# Patient Record
Sex: Female | Born: 1960 | Race: White | Hispanic: No | Marital: Married | Smoking: Never smoker
Health system: Southern US, Community
[De-identification: ages and names within clinical notes are randomized; demographics above are authoritative.]

## PROBLEM LIST (undated history)

## (undated) DIAGNOSIS — Z8614 Personal history of Methicillin resistant Staphylococcus aureus infection: Secondary | ICD-10-CM

## (undated) DIAGNOSIS — R112 Nausea with vomiting, unspecified: Secondary | ICD-10-CM

## (undated) DIAGNOSIS — Z862 Personal history of diseases of the blood and blood-forming organs and certain disorders involving the immune mechanism: Secondary | ICD-10-CM

## (undated) DIAGNOSIS — R519 Headache, unspecified: Secondary | ICD-10-CM

## (undated) DIAGNOSIS — R51 Headache: Secondary | ICD-10-CM

## (undated) DIAGNOSIS — N319 Neuromuscular dysfunction of bladder, unspecified: Secondary | ICD-10-CM

## (undated) DIAGNOSIS — Z803 Family history of malignant neoplasm of breast: Secondary | ICD-10-CM

## (undated) DIAGNOSIS — G35 Multiple sclerosis: Secondary | ICD-10-CM

## (undated) DIAGNOSIS — R5383 Other fatigue: Secondary | ICD-10-CM

## (undated) DIAGNOSIS — H8110 Benign paroxysmal vertigo, unspecified ear: Secondary | ICD-10-CM

## (undated) DIAGNOSIS — K5900 Constipation, unspecified: Secondary | ICD-10-CM

## (undated) DIAGNOSIS — Z9889 Other specified postprocedural states: Secondary | ICD-10-CM

## (undated) DIAGNOSIS — Z87442 Personal history of urinary calculi: Secondary | ICD-10-CM

## (undated) HISTORY — PX: OOPHORECTOMY: SHX86

## (undated) HISTORY — DX: Benign paroxysmal vertigo, unspecified ear: H81.10

## (undated) HISTORY — PX: BREAST LUMPECTOMY: SHX2

## (undated) HISTORY — PX: TONSILLECTOMY AND ADENOIDECTOMY: SUR1326

## (undated) HISTORY — PX: COLONOSCOPY: SHX174

---

## 1997-12-28 ENCOUNTER — Ambulatory Visit (HOSPITAL_BASED_OUTPATIENT_CLINIC_OR_DEPARTMENT_OTHER): Admission: RE | Admit: 1997-12-28 | Discharge: 1997-12-28 | Payer: Self-pay | Admitting: Surgery

## 1999-08-25 ENCOUNTER — Other Ambulatory Visit: Admission: RE | Admit: 1999-08-25 | Discharge: 1999-08-25 | Payer: Self-pay | Admitting: Obstetrics and Gynecology

## 2000-11-07 ENCOUNTER — Other Ambulatory Visit: Admission: RE | Admit: 2000-11-07 | Discharge: 2000-11-07 | Payer: Self-pay | Admitting: Obstetrics and Gynecology

## 2002-01-02 ENCOUNTER — Other Ambulatory Visit: Admission: RE | Admit: 2002-01-02 | Discharge: 2002-01-02 | Payer: Self-pay | Admitting: Obstetrics and Gynecology

## 2002-01-07 ENCOUNTER — Encounter: Payer: Self-pay | Admitting: Obstetrics and Gynecology

## 2002-01-07 ENCOUNTER — Encounter: Admission: RE | Admit: 2002-01-07 | Discharge: 2002-01-07 | Payer: Self-pay | Admitting: Obstetrics and Gynecology

## 2003-04-27 ENCOUNTER — Other Ambulatory Visit: Admission: RE | Admit: 2003-04-27 | Discharge: 2003-04-27 | Payer: Self-pay | Admitting: Obstetrics and Gynecology

## 2003-12-23 ENCOUNTER — Encounter: Admission: RE | Admit: 2003-12-23 | Discharge: 2003-12-23 | Payer: Self-pay | Admitting: Obstetrics and Gynecology

## 2004-05-18 ENCOUNTER — Encounter: Admission: RE | Admit: 2004-05-18 | Discharge: 2004-05-18 | Payer: Self-pay | Admitting: Obstetrics and Gynecology

## 2004-05-23 ENCOUNTER — Encounter: Admission: RE | Admit: 2004-05-23 | Discharge: 2004-05-23 | Payer: Self-pay | Admitting: Obstetrics and Gynecology

## 2004-06-08 ENCOUNTER — Other Ambulatory Visit: Admission: RE | Admit: 2004-06-08 | Discharge: 2004-06-08 | Payer: Self-pay | Admitting: Obstetrics and Gynecology

## 2005-06-29 ENCOUNTER — Encounter: Admission: RE | Admit: 2005-06-29 | Discharge: 2005-06-29 | Payer: Self-pay | Admitting: Obstetrics and Gynecology

## 2005-07-27 ENCOUNTER — Encounter: Admission: RE | Admit: 2005-07-27 | Discharge: 2005-07-27 | Payer: Self-pay | Admitting: Obstetrics and Gynecology

## 2005-11-27 ENCOUNTER — Encounter: Admission: RE | Admit: 2005-11-27 | Discharge: 2005-11-27 | Payer: Self-pay | Admitting: Obstetrics and Gynecology

## 2006-07-02 ENCOUNTER — Encounter: Admission: RE | Admit: 2006-07-02 | Discharge: 2006-07-02 | Payer: Self-pay | Admitting: Obstetrics and Gynecology

## 2007-07-04 ENCOUNTER — Encounter: Admission: RE | Admit: 2007-07-04 | Discharge: 2007-07-04 | Payer: Self-pay | Admitting: Obstetrics and Gynecology

## 2008-07-05 ENCOUNTER — Encounter: Admission: RE | Admit: 2008-07-05 | Discharge: 2008-07-05 | Payer: Self-pay | Admitting: Obstetrics and Gynecology

## 2008-12-12 ENCOUNTER — Ambulatory Visit (HOSPITAL_COMMUNITY): Admission: RE | Admit: 2008-12-12 | Discharge: 2008-12-12 | Payer: Self-pay | Admitting: Family Medicine

## 2009-07-12 ENCOUNTER — Encounter: Admission: RE | Admit: 2009-07-12 | Discharge: 2009-07-12 | Payer: Self-pay | Admitting: Obstetrics and Gynecology

## 2009-07-14 ENCOUNTER — Encounter: Admission: RE | Admit: 2009-07-14 | Discharge: 2009-07-14 | Payer: Self-pay | Admitting: Obstetrics and Gynecology

## 2010-03-26 ENCOUNTER — Encounter: Payer: Self-pay | Admitting: Obstetrics and Gynecology

## 2010-06-13 ENCOUNTER — Other Ambulatory Visit: Payer: Self-pay | Admitting: Obstetrics and Gynecology

## 2010-06-13 DIAGNOSIS — Z1231 Encounter for screening mammogram for malignant neoplasm of breast: Secondary | ICD-10-CM

## 2010-07-25 ENCOUNTER — Ambulatory Visit
Admission: RE | Admit: 2010-07-25 | Discharge: 2010-07-25 | Disposition: A | Discharge status: Home / Self Care | Payer: BC Managed Care – PPO | Source: Ambulatory Visit | Attending: Obstetrics and Gynecology | Admitting: Obstetrics and Gynecology

## 2010-07-25 DIAGNOSIS — Z1231 Encounter for screening mammogram for malignant neoplasm of breast: Secondary | ICD-10-CM

## 2011-07-17 ENCOUNTER — Other Ambulatory Visit: Payer: Self-pay | Admitting: Obstetrics and Gynecology

## 2011-07-17 DIAGNOSIS — Z1231 Encounter for screening mammogram for malignant neoplasm of breast: Secondary | ICD-10-CM

## 2011-07-31 ENCOUNTER — Ambulatory Visit
Admission: RE | Admit: 2011-07-31 | Discharge: 2011-07-31 | Disposition: A | Discharge status: Home / Self Care | Payer: Commercial Indemnity | Source: Ambulatory Visit | Attending: Obstetrics and Gynecology | Admitting: Obstetrics and Gynecology

## 2011-07-31 DIAGNOSIS — Z1231 Encounter for screening mammogram for malignant neoplasm of breast: Secondary | ICD-10-CM

## 2012-07-02 ENCOUNTER — Other Ambulatory Visit: Payer: Self-pay

## 2012-07-02 DIAGNOSIS — Z1231 Encounter for screening mammogram for malignant neoplasm of breast: Secondary | ICD-10-CM

## 2012-07-31 ENCOUNTER — Ambulatory Visit
Admission: RE | Admit: 2012-07-31 | Discharge: 2012-07-31 | Disposition: A | Discharge status: Home / Self Care | Payer: Commercial Indemnity | Source: Ambulatory Visit

## 2012-07-31 DIAGNOSIS — Z1231 Encounter for screening mammogram for malignant neoplasm of breast: Secondary | ICD-10-CM

## 2012-11-25 ENCOUNTER — Other Ambulatory Visit: Payer: Self-pay | Admitting: Obstetrics and Gynecology

## 2013-07-06 ENCOUNTER — Other Ambulatory Visit: Payer: Self-pay

## 2013-07-06 DIAGNOSIS — Z1231 Encounter for screening mammogram for malignant neoplasm of breast: Secondary | ICD-10-CM

## 2013-08-13 ENCOUNTER — Ambulatory Visit
Admission: RE | Admit: 2013-08-13 | Discharge: 2013-08-13 | Disposition: A | Discharge status: Home / Self Care | Payer: Commercial Indemnity | Source: Ambulatory Visit

## 2013-08-13 ENCOUNTER — Encounter (INDEPENDENT_AMBULATORY_CARE_PROVIDER_SITE_OTHER): Payer: Self-pay

## 2013-08-13 DIAGNOSIS — Z1231 Encounter for screening mammogram for malignant neoplasm of breast: Secondary | ICD-10-CM

## 2014-01-07 ENCOUNTER — Other Ambulatory Visit: Payer: Self-pay | Admitting: Obstetrics and Gynecology

## 2014-01-07 DIAGNOSIS — N63 Unspecified lump in unspecified breast: Secondary | ICD-10-CM

## 2014-01-18 ENCOUNTER — Other Ambulatory Visit: Payer: Commercial Indemnity

## 2014-01-20 ENCOUNTER — Ambulatory Visit
Admission: RE | Admit: 2014-01-20 | Discharge: 2014-01-20 | Disposition: A | Discharge status: Home / Self Care | Payer: Commercial Indemnity | Source: Ambulatory Visit | Attending: Obstetrics and Gynecology | Admitting: Obstetrics and Gynecology

## 2014-01-20 DIAGNOSIS — N63 Unspecified lump in unspecified breast: Secondary | ICD-10-CM

## 2014-07-26 ENCOUNTER — Other Ambulatory Visit: Payer: Self-pay

## 2014-07-26 DIAGNOSIS — Z1231 Encounter for screening mammogram for malignant neoplasm of breast: Secondary | ICD-10-CM

## 2014-09-08 ENCOUNTER — Ambulatory Visit
Admission: RE | Admit: 2014-09-08 | Discharge: 2014-09-08 | Disposition: A | Discharge status: Home / Self Care | Payer: Managed Care, Other (non HMO) | Source: Ambulatory Visit

## 2014-09-08 DIAGNOSIS — Z1231 Encounter for screening mammogram for malignant neoplasm of breast: Secondary | ICD-10-CM

## 2014-09-27 ENCOUNTER — Other Ambulatory Visit: Payer: Self-pay | Admitting: Obstetrics and Gynecology

## 2014-09-29 LAB — CYTOLOGY - PAP

## 2014-10-12 ENCOUNTER — Encounter: Payer: Self-pay | Admitting: Neurology

## 2014-10-12 ENCOUNTER — Ambulatory Visit (INDEPENDENT_AMBULATORY_CARE_PROVIDER_SITE_OTHER): Payer: Managed Care, Other (non HMO) | Admitting: Neurology

## 2014-10-12 VITALS — BP 134/90 | HR 84 | Resp 14 | Ht 65.75 in | Wt 121.2 lb

## 2014-10-12 DIAGNOSIS — H8110 Benign paroxysmal vertigo, unspecified ear: Secondary | ICD-10-CM | POA: Diagnosis not present

## 2014-10-12 DIAGNOSIS — R350 Frequency of micturition: Secondary | ICD-10-CM | POA: Diagnosis not present

## 2014-10-12 DIAGNOSIS — R5383 Other fatigue: Secondary | ICD-10-CM | POA: Diagnosis not present

## 2014-10-12 DIAGNOSIS — R200 Anesthesia of skin: Secondary | ICD-10-CM | POA: Diagnosis not present

## 2014-10-12 DIAGNOSIS — R260 Ataxic gait: Secondary | ICD-10-CM

## 2014-10-12 HISTORY — DX: Benign paroxysmal vertigo, unspecified ear: H81.10

## 2014-10-12 NOTE — Progress Notes (Signed)
[facility]  PATIENT: Crystal Park DOB: 02-15-1961  REFERRING DOCTOR OR PCP:  Radene Journey (ENT referred)   Lona Kettle (PCP) SOURCE: paitent and records from Dr. Lucia Gaskins.    CT images on PACS  _________________________________   HISTORICAL  CHIEF COMPLAINT:  Chief Complaint  Patient presents with  . Dizziness    Crystal Park is here with her husband [name] for eval of intermittent dizziness, first noticed in 2010, worse in the last yr.  Worse with lying flat and with quick movements.  No dizziness with sitting still. Sts. mri's have been neg.  Sts. eng was neg.  Sts. min. relief with epley maneuver.  Not sure if she's had vestibular rehab./fim    HISTORY OF PRESENT ILLNESS:  I had the pleasure of seeing your patient, Crystal Park, at [facility] for a neurologic consultation regarding her poor balance. As you know, she is a 54 year old woman who had an episode in 2010 where she woke up with diplopia.  This lasted 1 day. She went to the emergency room and had a CT scan done. I reviewed the actual images on PACS and they are normal. She also had an MRI of the brain performed at that time but those images and the report are not available for my review.   She recalls being told that the MRI showed one abnormal focus that was felt to be more related to migraines than to a possibility of MS. Since that day in 2010, though the diplopia improved, she has continued to feel off balance. She feels her right leg has more difficulties than the left. The sensation of being off balance fluctuates quite a bit and, for the most part, has been stable over the past few years.  This summer, she was at the beach outside for a while in the heat when she had the onset of significant worsening of her balance and more difficulty with weakness in the right leg. She went to the emergency room and was given IV fluids and felt better later that day. She had another mild  episode while shopping recently, as well.  She feels her gait is a little wide. She does best when she looks at the horizon when she walks. If she looks down she feels off balance. She turns the lights on at night to help with her walking.  She has noted tingling in her feet since 2010.      Over the past 2-3 years, she has had increasing urinary frequency and urgency and has had some incontinence. He was having nocturia but that is doing much better since Detrol was started. The occasional incontinence has also improved on Detrol.  Besides the double vision 6 years ago, she has not had any other definite visual symptoms.  She has had more fatigue over the past few years as well. She denies any difficulty with fatigue in the morning. However, as the day goes on she will have more difficulties with this. She has not had any episodes of severe lassitude. She feels her sleep is doing about the same now as it was in the past. She notes that she falls asleep easily but will wake up several times at night. However, she usually falls back asleep easily.  She denies any difficulty with depression. She has mild anxiety but feels that it is a stable issue for her. She has not noted any difficulties with cognition.  In July, 2016,, she had VNG and audiologic testing. 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   She notes some fatigue Eyes: No visual changes, double vision, eye pain Ear, nose and throat: No hearing loss, ear pain, nasal congestion, sore throat Cardiovascular: No chest pain, palpitations Respiratory: No shortness of breath at rest or with exertion.   No wheezes GastrointestinaI: No nausea, vomiting, diarrhea, abdominal pain, fecal incontinence Genitourinary: As above. No UTIs. Musculoskeletal: No neck pain, back pain Integumentary: No rash, pruritus, skin lesions Neurological: as  above Psychiatric: No depression at this time.  Mild anxiety Endocrine: No palpitations, diaphoresis, change in appetite, change in weigh or increased thirst Hematologic/Lymphatic: No anemia, purpura, petechiae. Allergic/Immunologic: No itchy/runny eyes, nasal congestion, recent allergic reactions, rashes  ALLERGIES: No Known Allergies  HOME MEDICATIONS:  Current outpatient prescriptions:  .  tolterodine (DETROL LA) 4 MG 24 hr capsule, , Disp: , Rfl:   PAST MEDICAL HISTORY: Past Medical History  Diagnosis Date  . Benign paroxysmal positional vertigo 10/12/2014    PAST SURGICAL HISTORY: History reviewed. No pertinent past surgical history.  FAMILY HISTORY: History reviewed. No pertinent family history.  SOCIAL HISTORY:  History   Social History  . Marital Status: Married    Spouse Name: N/A  . Number of Children: N/A  . Years of Education: N/A   Occupational History  . Not on file.   Social History Main Topics  . Smoking status: Never Smoker   . Smokeless tobacco: Not on file  . Alcohol Use: Not on file  . Drug Use: Not on file  . Sexual Activity: Not on file   Other Topics Concern  . Not on file   Social History Narrative  . No narrative on file     PHYSICAL EXAM  Filed Vitals:   10/12/14 1416  BP: 134/90  Pulse: 84  Resp: 14  Height: 5' 5.75" (1.67 m)  Weight: 121 lb 3.2 oz (54.976 kg)    Body mass index is 19.71 kg/(m^2).   General: The patient is well-developed and well-nourished and in no acute distress  Eyes:  Funduscopic exam is not entirely focused due to astigmatism.   Normal optic disc color and retinal vessels.  Neck: The neck is supple, no carotid bruits are noted.  The neck is nontender.  Cardiovascular: The heart has a regular rate and rhythm with a normal S1 and S2. There were no murmurs, gallops or rubs.   Skin: Extremities are without significant edema.  Musculoskeletal:  Back is nontender  Neurologic Exam  Mental  status: The patient is alert and oriented x 3 at the time of the examination. The patient has apparent normal recent and remote memory, with an apparently normal attention span and concentration ability.   Speech is normal.  Cranial nerves: Extraocular movements are full. Pupils are equal, round, and reactive to light and accomodation.    Facial symmetry is present. There is good facial sensation to soft touch bilaterally.Facial strength is normal.  Trapezius and sternocleidomastoid strength is normal. No dysarthria is noted.  The tongue is midline, and the patient has symmetric elevation of the soft palate. No obvious hearing deficits are noted.  Motor:  Muscle bulk is normal.   Tone is normal. Strength is  5 / 5 in the arms and proximal legs. However, strength is 4/5 in the EHL and intrinsic foot muscles.   Sensory: Sensory testing is intact to pinprick, soft touch and vibration sensation in the  arms. However, she has reduced vibration sensation at ankles and very reduced vibration sensation in the toes.  Coordination: Cerebellar testing reveals good finger-nose-finger and heel-to-shin bilaterally.  Gait and station: Station is normal.   Gait is wide stance. She has an unstable turn. There is no retropulsion. She is unable to do tandem walk.. Romberg is negative.   Reflexes: Deep tendon reflexes are symmetric and 3+ bilaterally and increased furhter in the knees (with spread).  No ankle clonus..   Plantar responses are flexor.    DIAGNOSTIC DATA (LABS, IMAGING, TESTING) - I reviewed patient records, labs, notes, testing and imaging myself where available.     ASSESSMENT AND PLAN  Benign paroxysmal positional vertigo, unspecified laterality  Ataxic gait - Plan: MR Brain W Wo Contrast, MR Cervical Spine W Wo Contrast, Sedimentation rate, Multiple myeloma panel, serum, Vitamin B12, ANA w/Reflex, Vit D  25 hydroxy (rtn osteoporosis monitoring)  Urinary frequency - Plan: MR Brain W Wo  Contrast, MR Cervical Spine W Wo Contrast  Other fatigue - Plan: Multiple myeloma panel, serum, Vitamin B12, ANA w/Reflex, Vit D  25 hydroxy (rtn osteoporosis monitoring)  Numbness - Plan: MR Brain W Wo Contrast, MR Cervical Spine W Wo Contrast, Sedimentation rate, Multiple myeloma panel, serum, Vitamin B12, ANA w/Reflex    In summary, Crystal Park is a 54 yo woman with gait ataxia and a sensation of being off balanced who also has numbness and urinary fatigue.   Despite the MRI report from 2010, I remain concerned about the possibility of multiple sclerosis.   Additionally, structural brain issues (CVA, hydrocephalus) and disorders of the spinal cord could explain her symptoms.   We will check an MRI of the brain and cervical spine to better assess these possibilities.  If changes consistent with MS are seen, we would need to start a disease modifying agent.   If a spinal cord process is seen, further evaluation or procedures may be necessary.     Her leg numbness is symmetric and polyneuropathy needs to also be considered.    B12, SPEP/IEF, ANA, ESR will be checked.    If these studies all return normal, I will also check NCV/EMG.  I will see her back in 6 weeks or sooner if she has new or worsening symptoms.  Thank you for asking me to see Crystal Park for a neurologic consultation.  Please let me know ifI can be of further assistance with her or other patients in the future.Nanine Means. Felecia Shelling, MD, PhD 09/11/1502, 1:36 PM Certified in Neurology, Clinical Neurophysiology, Sleep Medicine, Pain Medicine and Neuroimaging  [facility] [address] [place], [place] [address] [phone]

## 2014-10-14 LAB — MULTIPLE MYELOMA PANEL, SERUM
Albumin SerPl Elph-Mcnc: 4 g/dL (ref 2.9–4.4)
Albumin/Glob SerPl: 1.7 (ref 0.7–1.7)
Alpha 1: 0.2 g/dL (ref 0.0–0.4)
Alpha2 Glob SerPl Elph-Mcnc: 0.6 g/dL (ref 0.4–1.0)
B-Globulin SerPl Elph-Mcnc: 0.9 g/dL (ref 0.7–1.3)
Gamma Glob SerPl Elph-Mcnc: 0.7 g/dL (ref 0.4–1.8)
Globulin, Total: 2.5 g/dL (ref 2.2–3.9)
IgA/Immunoglobulin A, Serum: 141 mg/dL (ref 87–352)
IgG (Immunoglobin G), Serum: 846 mg/dL (ref 700–1600)
IgM (Immunoglobulin M), Srm: 117 mg/dL (ref 26–217)
Total Protein: 6.5 g/dL (ref 6.0–8.5)

## 2014-10-14 LAB — ANA W/REFLEX: Anti Nuclear Antibody(ANA): NEGATIVE

## 2014-10-14 LAB — VITAMIN D 25 HYDROXY (VIT D DEFICIENCY, FRACTURES): Vit D, 25-Hydroxy: 26.2 ng/mL — ABNORMAL LOW (ref 30.0–100.0)

## 2014-10-14 LAB — VITAMIN B12: Vitamin B-12: >2000 pg/mL — ABNORMAL HIGH (ref 211–946)

## 2014-10-14 LAB — SEDIMENTATION RATE: Sed Rate: 2 mm/hr (ref 0–40)

## 2014-10-18 ENCOUNTER — Telehealth: Payer: Self-pay | Admitting: *Deleted

## 2014-10-18 ENCOUNTER — Other Ambulatory Visit: Payer: Self-pay | Admitting: Obstetrics and Gynecology

## 2014-10-18 NOTE — Telephone Encounter (Signed)
I have spoken with Crystal Park's husband Crystal Park, and per RAS, advised that vit. d is a little low, she can take otc vit. d 2-4,000iu daily.  He verbalized understanding of same/fim

## 2014-10-18 NOTE — Telephone Encounter (Signed)
-----   Message from Asa Lente, MD sent at 10/17/2014  9:56 AM EDT ----- Vit D is a little low -- do 2000-4000 U OTC daily    Other labs are fine

## 2014-10-20 ENCOUNTER — Ambulatory Visit (INDEPENDENT_AMBULATORY_CARE_PROVIDER_SITE_OTHER): Payer: Managed Care, Other (non HMO)

## 2014-10-20 DIAGNOSIS — R350 Frequency of micturition: Secondary | ICD-10-CM

## 2014-10-20 DIAGNOSIS — R260 Ataxic gait: Secondary | ICD-10-CM | POA: Diagnosis not present

## 2014-10-20 DIAGNOSIS — R200 Anesthesia of skin: Secondary | ICD-10-CM | POA: Diagnosis not present

## 2014-10-20 MED ORDER — GADOPENTETATE DIMEGLUMINE 469.01 MG/ML IV SOLN: 12.0000 mL | Freq: Once | INTRAVENOUS | Status: DC | PRN

## 2014-10-25 ENCOUNTER — Telehealth: Payer: Self-pay | Admitting: Neurology

## 2014-10-26 ENCOUNTER — Encounter: Payer: Self-pay | Admitting: Neurology

## 2014-10-26 ENCOUNTER — Ambulatory Visit (INDEPENDENT_AMBULATORY_CARE_PROVIDER_SITE_OTHER): Payer: Managed Care, Other (non HMO) | Admitting: Neurology

## 2014-10-26 VITALS — BP 130/86 | HR 90 | Resp 14 | Ht 65.75 in | Wt 120.4 lb

## 2014-10-26 DIAGNOSIS — G35 Multiple sclerosis: Secondary | ICD-10-CM | POA: Diagnosis not present

## 2014-10-26 DIAGNOSIS — R350 Frequency of micturition: Secondary | ICD-10-CM

## 2014-10-26 DIAGNOSIS — R200 Anesthesia of skin: Secondary | ICD-10-CM | POA: Diagnosis not present

## 2014-10-26 DIAGNOSIS — R260 Ataxic gait: Secondary | ICD-10-CM | POA: Diagnosis not present

## 2014-10-26 DIAGNOSIS — R5383 Other fatigue: Secondary | ICD-10-CM

## 2014-10-26 MED ORDER — AMANTADINE HCL 100 MG PO CAPS: 100.0000 mg | ORAL_CAPSULE | Freq: Two times a day (BID) | ORAL | Status: DC

## 2014-10-26 NOTE — Progress Notes (Signed)
Marland Kitchen   [facility]  PATIENT: Crystal Park DOB: 08/31/1960  REFERRING DOCTOR OR PCP:  Narda Bonds (ENT referred)   [name] (PCP) SOURCE: paitent and records from Dr. Ezzard Standing.    CT images on PACS  _________________________________   HISTORICAL  CHIEF COMPLAINT:  Chief Complaint  Patient presents with  . Abnormal MRI    Here to discuss abnormal mri brain, c-spine and possible MS dx, tx. options/fim    HISTORY OF PRESENT ILLNESS:  Crystal Park is a 54 year old woman who had vertigo and gait ataxia. MRI of the brain and spinal cord injury consistent with multiple sclerosis and she returns today to go over these results in more detail and to decide on a medication.  I personally reviewed the MRI of the brain and the spinal cord in the presence of her and her husband.   The MRI of the spine shows several T2 weighted lesions, some peripherally located consistent with multiple sclerosis. None of the foci enhanced. The MRI of the brain shows multiple foci in the periventricular, juxtacortical cortical and deep white matter consistent with multiple sclerosis. This study was done without contrast.  We had a long discussion about prognosis and treatment options.  She is most interested in one of the oral agents. All of her questions were answered.  She brought a disc from [facility] but it did not contain the MRI from 2010.  I will try to get these results on CD if available.     REVIEW OF SYSTEMS: Constitutional: No fevers, chills, sweats, or change in appetite.   She notes some fatigue Eyes: No visual changes, double vision, eye pain Ear, nose and throat: No hearing loss, ear pain, nasal congestion, sore throat Cardiovascular: No chest pain, palpitations Respiratory: No shortness of breath at rest or with exertion.   No wheezes GastrointestinaI: No nausea, vomiting, diarrhea, abdominal pain, fecal incontinence Genitourinary: As above. No  UTIs. Musculoskeletal: No neck pain, back pain Integumentary: No rash, pruritus, skin lesions Neurological: as above Psychiatric: No depression at this time.  Mild anxiety Endocrine: No palpitations, diaphoresis, change in appetite, change in weigh or increased thirst Hematologic/Lymphatic: No anemia, purpura, petechiae. Allergic/Immunologic: No itchy/runny eyes, nasal congestion, recent allergic reactions, rashes  ALLERGIES: No Known Allergies  HOME MEDICATIONS:  Current outpatient prescriptions:  .  cholecalciferol (VITAMIN D) 1000 UNITS tablet, Take 1,000 Units by mouth daily. otc vit. d 5,000iu daily/fim, Disp: , Rfl:  .  Multiple Vitamin (MULTIVITAMIN) tablet, Take 1 tablet by mouth daily., Disp: , Rfl:  .  tolterodine (DETROL LA) 4 MG 24 hr capsule, , Disp: , Rfl:  .  amantadine (SYMMETREL) 100 MG capsule, Take 1 capsule (100 mg total) by mouth 2 (two) times daily., Disp: 60 capsule, Rfl: 11 No current facility-administered medications for this visit.  Facility-Administered Medications Ordered in Other Visits:  .  gadopentetate dimeglumine (MAGNEVIST) injection 12 mL, 12 mL, Intravenous, Once PRN, Asa Lente, MD  PAST MEDICAL HISTORY: Past Medical History  Diagnosis Date  . Benign paroxysmal positional vertigo 10/12/2014    PAST SURGICAL HISTORY: No past surgical history on file.  FAMILY HISTORY: No family history on file.  SOCIAL HISTORY:  Social History   Social History  . Marital Status: Married    Spouse Name: N/A  . Number of Children: N/A  . Years of Education: N/A   Occupational History  . Not on file.   Social History Main Topics  . Smoking status: Never Smoker   .  Smokeless tobacco: Not on file  . Alcohol Use: Not on file  . Drug Use: Not on file  . Sexual Activity: Not on file   Other Topics Concern  . Not on file   Social History Narrative     PHYSICAL EXAM  Filed Vitals:   10/26/14 1511  BP: 130/86  Pulse: 90  Resp: 14   Height: 5' 5.75" (1.67 m)  Weight: 120 lb 6.4 oz (54.613 kg)    Body mass index is 19.58 kg/(m^2).   General: The patient is well-developed and well-nourished and in no acute distress  Neurologic Exam  Mental status: The patient is alert and oriented x 3 at the time of the examination. The patient has apparent normal recent and remote memory, with an apparently normal attention span and concentration ability.   Speech is normal.  Cranial nerves: Extraocular movements are full.   There is good facial sensation to soft touch bilaterally.Facial strength is normal.  Trapezius and sternocleidomastoid strength is normal. No dysarthria is noted.    No obvious hearing deficits are noted.  Motor:  Muscle bulk is normal.   Tone is normal. Strength is  5 / 5 in the arms and proximal legs. However, strength is 4/5 in the EHL and intrinsic foot muscles.   Sensory: Sensory testing is intact to pinprick, soft touch and vibration sensation in the arms. However, she has reduced vibration sensation at ankles and very reduced vibration sensation in the toes.  Coordination: Cerebellar testing reveals good finger-nose-finger and heel-to-shin bilaterally.  Gait and station: Station is normal.   Gait is wide stance. She has an unstable turn. There is no retropulsion. She is unable to do tandem walk.. Romberg is negative.   Reflexes: Deep tendon reflexes are symmetric and 3+ bilaterally  and increased in the knees (with spread).  No ankle clonus.Marland Kitchen       DIAGNOSTIC DATA (LABS, IMAGING, TESTING) - I reviewed patient records, labs, notes, testing and imaging myself where available.     ASSESSMENT AND PLAN  Ataxic gait - Plan: MR Brain W Contrast, For home use only DME Cane, ANA w/Reflex, Vit D  25 hydroxy (rtn osteoporosis monitoring), Pan-ANCA  Other fatigue - Plan: Quantiferon tb gold assay, ANA w/Reflex, Vit D  25 hydroxy (rtn osteoporosis monitoring), Pan-ANCA  Numbness - Plan: MR Brain W Contrast,  ANA w/Reflex, Vit D  25 hydroxy (rtn osteoporosis monitoring), Pan-ANCA  Urinary frequency - Plan: ANA w/Reflex, Vit D  25 hydroxy (rtn osteoporosis monitoring), Pan-ANCA  Multiple sclerosis - Plan: MR Brain W Contrast, For home use only DME Cane, CBC with Differential/Platelet, Comprehensive metabolic panel, Quantiferon tb gold assay, Stratify JCV Antibody Test ([facility]), ANA w/Reflex, Vit D  25 hydroxy (rtn osteoporosis monitoring), Pan-ANCA   1.   We had a long discussion about treatment options for MS. She is most interested in one of the oral agents and will choose between Aubagio and Tecfidera. She will let us know once she has made his choice. In the interim, I will go ahead and check blood work for blood work necessary before starting one of these 2 agents and to check some of the MS mimics.   I'll also check vitamin D as deficiency can worsen MS. 2.   She is advised to continue to be active and exercises as tolerated. I wrote for a cane. 3.   We will call her with the results of the blood work and she will be running between her options. 4.  We will get an MRI of the brain with contrast. If she has multiple enhancing lesions, then I would recommend that she reconsider Tysabri therapy.  I will see her back in 6-8 weeks or sooner if she has new or worsening symptoms.  50 minute face-to-face interaction with greater than one half of the time counseling and coordinating care about her new diagnosis of multiple sclerosis and her prognosis and treatment options.  Thank you for asking me to see Ms. Lattanzio for a neurologic consultation.  Please let me know ifI can be of further assistance with her or other patients in the future.Pearletha Furl. Epimenio Foot, MD, PhD 10/26/2014, 6:28 PM Certified in Neurology, Clinical Neurophysiology, Sleep Medicine, Pain Medicine and Neuroimaging  [facility] [address] [place], [place] [address] [phone]

## 2014-10-26 NOTE — Telephone Encounter (Signed)
I spoke separately to Mr. and Mrs. Townsel.     The MRI of the brain and spine are very consistent with multiple sclerosis with several foci in the spine as well as many of the brain including juxtacortical foci, Dawson's fingers, and a left middle cerebellar peduncle lesion.  Combination of these features allow [place] to be certain of the diagnosis and I do not think she needs to have a lumbar puncture.  We will see if we can get her in this week.  FAITH:    Please schedule her this week. Its okay to use a lunch time slot if needed as I will be out of town next week.

## 2014-10-26 NOTE — Telephone Encounter (Signed)
I have spoken with Crystal Park this morning and given her an appt. to discuss mri results in greater detail/tx. options for ms, this afternoon at 1520/fim

## 2014-10-27 LAB — CBC WITH DIFFERENTIAL/PLATELET
Basophils Absolute: 0 10*3/uL (ref 0.0–0.2)
Basos: 0 %
EOS (ABSOLUTE): 0.1 10*3/uL (ref 0.0–0.4)
Eos: 1 %
Hematocrit: 38.3 % (ref 34.0–46.6)
Hemoglobin: 12.9 g/dL (ref 11.1–15.9)
Immature Grans (Abs): 0 10*3/uL (ref 0.0–0.1)
Immature Granulocytes: 0 %
Lymphocytes Absolute: 2.2 10*3/uL (ref 0.7–3.1)
Lymphs: 25 %
MCH: 31.2 pg (ref 26.6–33.0)
MCHC: 33.7 g/dL (ref 31.5–35.7)
MCV: 93 fL (ref 79–97)
Monocytes Absolute: 0.7 10*3/uL (ref 0.1–0.9)
Monocytes: 8 %
Neutrophils Absolute: 5.9 10*3/uL (ref 1.4–7.0)
Neutrophils: 66 %
Platelets: 431 10*3/uL — ABNORMAL HIGH (ref 150–379)
RBC: 4.13 x10E6/uL (ref 3.77–5.28)
RDW: 14.9 % (ref 12.3–15.4)
WBC: 9 10*3/uL (ref 3.4–10.8)

## 2014-10-27 LAB — COMPREHENSIVE METABOLIC PANEL
ALT: 16 IU/L (ref 0–32)
AST: 12 IU/L (ref 0–40)
Albumin/Globulin Ratio: 1.9 (ref 1.1–2.5)
Albumin: 4.5 g/dL (ref 3.5–5.5)
Alkaline Phosphatase: 59 IU/L (ref 39–117)
BUN/Creatinine Ratio: 25 — ABNORMAL HIGH (ref 9–23)
BUN: 17 mg/dL (ref 6–24)
Bilirubin Total: 0.4 mg/dL (ref 0.0–1.2)
CO2: 20 mmol/L (ref 18–29)
Calcium: 9.5 mg/dL (ref 8.7–10.2)
Chloride: 102 mmol/L (ref 97–108)
Creatinine, Ser: 0.68 mg/dL (ref 0.57–1.00)
GFR calc Af Amer: 116 mL/min/{1.73_m2} (ref >59)
GFR calc non Af Amer: 101 mL/min/{1.73_m2} (ref >59)
Globulin, Total: 2.4 g/dL (ref 1.5–4.5)
Glucose: 88 mg/dL (ref 65–99)
Potassium: 4 mmol/L (ref 3.5–5.2)
Sodium: 141 mmol/L (ref 134–144)
Total Protein: 6.9 g/dL (ref 6.0–8.5)

## 2014-10-27 LAB — PAN-ANCA
ANCA Proteinase 3: <3.5 U/mL (ref 0.0–3.5)
Atypical pANCA: <1:20 {titer}
C-ANCA: <1:20 {titer}
Myeloperoxidase Ab: <9 U/mL (ref 0.0–9.0)
P-ANCA: <1:20 {titer}

## 2014-10-27 LAB — VITAMIN D 25 HYDROXY (VIT D DEFICIENCY, FRACTURES): Vit D, 25-Hydroxy: 32.4 ng/mL (ref 30.0–100.0)

## 2014-10-27 LAB — ANA W/REFLEX: Anti Nuclear Antibody(ANA): NEGATIVE

## 2014-10-29 LAB — QUANTIFERON IN TUBE
QFT TB AG MINUS NIL VALUE: 0 IU/mL
QUANTIFERON MITOGEN VALUE: >10 IU/mL
QUANTIFERON TB AG VALUE: 0.03 IU/mL
QUANTIFERON TB GOLD: NEGATIVE
Quantiferon Nil Value: 0.03 IU/mL

## 2014-10-29 LAB — QUANTIFERON TB GOLD ASSAY (BLOOD)

## 2014-11-02 ENCOUNTER — Telehealth: Payer: Self-pay | Admitting: Neurology

## 2014-11-02 NOTE — Telephone Encounter (Signed)
Crystal Park called stating that patient was to have bloodwork done to figure out whether to do Tecfidera or Aubagio, is wondering how long it will take to figure out which medication to prescribe. Patient had MRI's 10/12/14 brain with contrast, patient recv'd letter "brain w/o contrast" denied as duplicate, already done.

## 2014-11-02 NOTE — Telephone Encounter (Signed)
Spoke to [place] - he is aware that Dr. Epimenio Foot is out of the office.  I told him we would call him back to discuss his wife's lab work and recommended therapy.  Also, we need to get clarification on her MRI.  It appears that a MRI brain w/wo was completed on 10/21/14.  In the meantime, I encourage them to read over the information for both MS agents (Tecfidera and Aubagio) and let us know if they have any questions.

## 2014-11-03 NOTE — Telephone Encounter (Signed)
I have spoken with Mr. Jaspers.  1--Mr. Becker would like to know, now that lab results are in, if RAS rec. one med (Tecfidera vs Aubagio) over the other.  He sts. that in addition to the pt. lit. we gave them, they have researched meds online, and that based on research, he wonders if Tecfidera will be more effective for her.  I will check with RAS to see if he wants to call pt. back, or if he needs a new appt. with them. 2--I don't see that Braylea has had a liver profile done.  I need to check with RAS and see if he needs one.  If she starts Tecfidera, he may not need one at this time, and we can get a baseline at her next ov, but if they are considering Aubagio over Tecfidera, she would need to come in for labs. 3--It looks like the August mri she had was with and without contrast.  I will verify with RAS and see if he does need her to have another mri, and let Mr. Natividad know./fim

## 2014-11-09 MED ORDER — FLUOXETINE HCL 20 MG PO CAPS: 20.0000 mg | ORAL_CAPSULE | Freq: Every day | ORAL | Status: DC

## 2014-11-09 NOTE — Addendum Note (Signed)
Addended by: Candis Schatz I on: 11/09/2014 03:30 PM   Modules accepted: Medications

## 2014-11-09 NOTE — Telephone Encounter (Signed)
I spoke with Mrs. Buchinger. MRI of the brain with contrast is not needed as she had recent MRI of the brain with and without contrast.  I went over Aubagio versus Tecfidera and we will start Tecfidera. We will send in the [facility].   FAITH: Please send in the Tecfidera SRF.

## 2014-11-09 NOTE — Telephone Encounter (Signed)
Tecfidera start form faxed to [employer] fax # [phone]/fim 

## 2014-11-10 ENCOUNTER — Other Ambulatory Visit: Payer: Managed Care, Other (non HMO)

## 2014-11-10 ENCOUNTER — Encounter: Payer: Self-pay | Admitting: *Deleted

## 2014-11-17 ENCOUNTER — Encounter: Payer: Self-pay | Admitting: *Deleted

## 2014-11-19 ENCOUNTER — Telehealth: Payer: Self-pay | Admitting: Neurology

## 2014-11-19 MED ORDER — LORAZEPAM 0.5 MG PO TABS: 0.5000 mg | ORAL_TABLET | Freq: Three times a day (TID) | ORAL | Status: DC | PRN

## 2014-11-19 NOTE — Telephone Encounter (Signed)
I have spoken with Crystal Park this morning.  She sts. since starting Prozac, dizziness is worse, she has had nausea, constipation, bilat rib pain.  I will check with RAS and call her back/fim

## 2014-11-19 NOTE — Telephone Encounter (Signed)
Patient called stating she was having some problems with FLUoxetine (PROZAC) 20 MG capsule .She is complaining of  nausea, constipation and soreness under ribs x 3 days. She started medication last Tuesday (11/09/14). Please call and advise. Patient can be reached at [phone].

## 2014-11-19 NOTE — Telephone Encounter (Signed)
I spoke to Crystal Park.   She is having side effects on fluoxetine and I will discontinue this. She continues to have vertigo. 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 I'll have her try lorazepam 0.5 mg by mouth 3 times a day when necessary for her vertigo.  We'll [facility] a prescription to the [facility] on [address].

## 2014-11-23 ENCOUNTER — Ambulatory Visit: Payer: Managed Care, Other (non HMO) | Admitting: Neurology

## 2014-12-06 ENCOUNTER — Telehealth: Payer: Self-pay | Admitting: Neurology

## 2014-12-06 NOTE — Telephone Encounter (Signed)
[employer] [phone] ext [phone] called needing Authorization for Tecfidera. Patient has already been prescribed the starter pak but needs maintenance medication, 30 day supply. Rx is on file but not the authorization.

## 2014-12-06 NOTE — Telephone Encounter (Signed)
I initiated a prior auth electronically and received a response stating a prior auth was not required.  I called the number provided.  Spoke with [employer].  She said for some reason there is an issue with the system.  She transferred me to another dept.  I provided clinical info again, and they have approved coverage on Tecfidera effective until 11/09/2015 Ref # [id number]

## 2014-12-23 ENCOUNTER — Ambulatory Visit (INDEPENDENT_AMBULATORY_CARE_PROVIDER_SITE_OTHER): Payer: Managed Care, Other (non HMO) | Admitting: Neurology

## 2014-12-23 ENCOUNTER — Encounter: Payer: Self-pay | Admitting: Neurology

## 2014-12-23 VITALS — BP 110/80 | HR 87 | Resp 12 | Ht 65.75 in | Wt 123.4 lb

## 2014-12-23 DIAGNOSIS — R200 Anesthesia of skin: Secondary | ICD-10-CM | POA: Diagnosis not present

## 2014-12-23 DIAGNOSIS — R42 Dizziness and giddiness: Secondary | ICD-10-CM | POA: Diagnosis not present

## 2014-12-23 DIAGNOSIS — R260 Ataxic gait: Secondary | ICD-10-CM

## 2014-12-23 DIAGNOSIS — R5383 Other fatigue: Secondary | ICD-10-CM

## 2014-12-23 DIAGNOSIS — R350 Frequency of micturition: Secondary | ICD-10-CM | POA: Diagnosis not present

## 2014-12-23 DIAGNOSIS — G35 Multiple sclerosis: Secondary | ICD-10-CM | POA: Diagnosis not present

## 2014-12-23 DIAGNOSIS — H8110 Benign paroxysmal vertigo, unspecified ear: Secondary | ICD-10-CM

## 2014-12-23 MED ORDER — METHYLPREDNISOLONE 4 MG PO TABS: 4.0000 mg | ORAL_TABLET | Freq: Every day | ORAL | Status: DC

## 2014-12-23 MED ORDER — AMANTADINE HCL 100 MG PO CAPS: 100.0000 mg | ORAL_CAPSULE | Freq: Two times a day (BID) | ORAL | Status: DC

## 2014-12-23 NOTE — Progress Notes (Signed)
Marland Kitchen   [facility]  PATIENT: Crystal Park DOB: [birth year]  REFERRING DOCTOR OR PCP:  Narda Bonds (ENT referred)   [name] (PCP) SOURCE: paitent and records from Dr. Ezzard Standing.    CT images on PACS  _________________________________   HISTORICAL  CHIEF COMPLAINT:  Chief Complaint  Patient presents with  . Multiple Sclerosis    Sts. she is tolerating Tecfidera well.  Sts. she did have 2 days of diarrhea--thinks this was due to taking Tecfidera on an empty stomach.  Diarrhea has not returned.  Sts. she remains dizzy constantly, is no longer able to wear her contacts./fim  . Dizziness    HISTORY OF PRESENT ILLNESS:  Crystal Park is a 54 year old woman who was recently diagnosed with multiple sclerosis. She has vertigo and gait ataxia. MRI of the brain and spinal cord injury consistent with multiple sclerosis.  She started Tecfidera and has generally done well.   She once took on a near empty stomach and had diarrhea.   Otherwise, she has done well.    She has had persistent vertigo for the past few months.   [name] exercises and lorazepam have not helped.     She actually has had vertigo on and off for many years but have been more persistent the past few months. Her gait is poorly balance.  She denies any numbness or weakness in the arms or legs.  She denies any difficulty with her vision. She notes urinary frequency.   This has been helped some by Detrol.  She has had fatigue for the past few years that worsened this year. Amantadine has helped the fatigue some. She would like to continue that medication.  I personally reviewed the MRI of the brain and the spinal cord.   The MRI of the spine shows several T2 weighted lesions, some peripherally located consistent with multiple sclerosis. None of the foci enhanced. The MRI of the brain shows multiple foci in the periventricular, juxtacortical cortical and deep white matter consistent with multiple  sclerosis.   There is also a left cerebellar focus that could represent remote stroke (it is also present on CT scan from 2010)    REVIEW OF SYSTEMS: Constitutional: No fevers, chills, sweats, or change in appetite.   She notes fatigue Eyes: No visual changes, double vision, eye pain Ear, nose and throat: No hearing loss, ear pain, nasal congestion, sore throat Cardiovascular: No chest pain, palpitations Respiratory: No shortness of breath at rest or with exertion.   No wheezes GastrointestinaI: No nausea, vomiting, diarrhea, abdominal pain, fecal incontinence Genitourinary: As above. No UTIs. Musculoskeletal: No neck pain, back pain Integumentary: No rash, pruritus, skin lesions Neurological: as above Psychiatric: No depression at this time.  Mild anxiety Endocrine: No palpitations, diaphoresis, change in appetite, change in weigh or increased thirst Hematologic/Lymphatic: No anemia, purpura, petechiae. Allergic/Immunologic: No itchy/runny eyes, nasal congestion, recent allergic reactions, rashes  ALLERGIES: No Known Allergies  HOME MEDICATIONS:  Current outpatient prescriptions:  .  amantadine (SYMMETREL) 100 MG capsule, Take 1 capsule (100 mg total) by mouth 2 (two) times daily., Disp: 60 capsule, Rfl: 11 .  cholecalciferol (VITAMIN D) 1000 UNITS tablet, Take 1,000 Units by mouth daily. otc vit. d 5,000iu daily/fim, Disp: , Rfl:  .  Dimethyl Fumarate 240 MG CPDR, Take 240 mg by mouth daily., Disp: , Rfl:  .  LORazepam (ATIVAN) 0.5 MG tablet, Take 1 tablet (0.5 mg total) by mouth every 8 (eight) hours as needed for anxiety., Disp:  90 tablet, Rfl: 1 .  Multiple Vitamin (MULTIVITAMIN) tablet, Take 1 tablet by mouth daily., Disp: , Rfl:  .  tolterodine (DETROL LA) 4 MG 24 hr capsule, , Disp: , Rfl:  No current facility-administered medications for this visit.  Facility-Administered Medications Ordered in Other Visits:  .  gadopentetate dimeglumine (MAGNEVIST) injection 12 mL, 12  mL, Intravenous, Once PRN, Asa Lente, MD  PAST MEDICAL HISTORY: Past Medical History  Diagnosis Date  . Benign paroxysmal positional vertigo 10/12/2014    PAST SURGICAL HISTORY: History reviewed. No pertinent past surgical history.  FAMILY HISTORY: History reviewed. No pertinent family history.  SOCIAL HISTORY:  Social History   Social History  . Marital Status: Married    Spouse Name: N/A  . Number of Children: N/A  . Years of Education: N/A   Occupational History  . Not on file.   Social History Main Topics  . Smoking status: Never Smoker   . Smokeless tobacco: Not on file  . Alcohol Use: Not on file  . Drug Use: Not on file  . Sexual Activity: Not on file   Other Topics Concern  . Not on file   Social History Narrative     PHYSICAL EXAM  Filed Vitals:   12/23/14 1558  BP: 110/80  Pulse: 87  Resp: 12  Height: 5' 5.75" (1.67 m)  Weight: 123 lb 6.4 oz (55.974 kg)    Body mass index is 20.07 kg/(m^2).   General: The patient is well-developed and well-nourished and in no acute distress  Neurologic Exam  Mental status: The patient is alert and oriented x 3 at the time of the examination. The patient has apparent normal recent and remote memory, with an apparently normal attention span and concentration ability.   Speech is normal.  Cranial nerves: Extraocular movements are full.   There is good facial sensation to soft touch bilaterally.Facial strength is normal.  Trapezius and sternocleidomastoid strength is normal. No dysarthria is noted.    No obvious hearing deficits are noted.  Motor:  Muscle bulk is normal.   Tone is normal. Strength is  5 / 5 in the arms and proximal legs. However, strength is 4/5 in the EHL and intrinsic foot muscles.   Sensory: Sensory testing is intact to pinprick, soft touch and vibration sensation in the arms. She has reduced vibration sensation at ankles and very reduced vibration sensation in the toes.  Coordination:  Cerebellar testing reveals good finger-nose-finger and heel-to-shin bilaterally.  Gait and station: Station is normal.   Gait is wide stance. She has an unstable turn.   She is unable to do tandem walk.. Romberg is negative.   Reflexes: Deep tendon reflexes are symmetric and 3+ bilaterally  and increased in the knees (with spread).  No ankle clonus.Marland Kitchen       DIAGNOSTIC DATA (LABS, IMAGING, TESTING) - I reviewed patient records, labs, notes, testing and imaging myself where available.     ASSESSMENT AND PLAN  Multiple sclerosis (HCC)  Benign paroxysmal positional vertigo, unspecified laterality  Urinary frequency  Numbness  Ataxic gait  Other fatigue   1.   Vertigo has persisted despite benzodiazepines and exercises. I will refer to PT for vestibular therapy and gait therapy. 2.   Continue Tecfidera 3.   Stay active and exercises tolerated. 4.    I will see her back 3-4 months or sooner if she has new or worsening symptoms.  Elster Corbello A. Epimenio Foot, MD, PhD 12/23/2014, 4:09 PM Certified in Neurology,  Clinical Neurophysiology, Sleep Medicine, Pain Medicine and Neuroimaging  [facility] [address] [place], [place] [address] [phone]

## 2014-12-28 ENCOUNTER — Encounter: Payer: Self-pay | Admitting: Neurology

## 2015-01-07 ENCOUNTER — Ambulatory Visit: Payer: Managed Care, Other (non HMO) | Attending: Neurology | Admitting: Physical Therapy

## 2015-01-07 DIAGNOSIS — R269 Unspecified abnormalities of gait and mobility: Secondary | ICD-10-CM | POA: Insufficient documentation

## 2015-01-07 DIAGNOSIS — R42 Dizziness and giddiness: Secondary | ICD-10-CM | POA: Diagnosis not present

## 2015-01-07 DIAGNOSIS — R2681 Unsteadiness on feet: Secondary | ICD-10-CM | POA: Insufficient documentation

## 2015-01-07 NOTE — Therapy (Signed)
[facility] [address] [place], [place], [address] Phone: [phone]   Fax:  [phone]  Physical Therapy Evaluation  Patient Details  Name: Crystal Park MRN: [id number] Date of Birth: [birth year] Referring Provider: Despina Arias, MD  Encounter Date: 01/07/2015      PT End of Session - 01/07/15 0921    Visit Number 1   Number of Visits 17  eval + 16 visits   Date for PT Re-Evaluation 03/08/15   Authorization Type [employer] - 20 visit limit for PT   PT Start Time 0801   PT Stop Time 0849   PT Time Calculation (min) 48 min   Activity Tolerance Other (comment);Patient tolerated treatment well  frequent rests required due to dizziness/disequilibrium   Behavior During Therapy [facility] for tasks assessed/performed      Past Medical History  Diagnosis Date  . Benign paroxysmal positional vertigo 10/12/2014    No past surgical history on file.  There were no vitals filed for this visit.  Visit Diagnosis:  Dizziness and giddiness - Plan: PT plan of care cert/re-cert  Abnormality of gait - Plan: PT plan of care cert/re-cert  Unsteadiness - Plan: PT plan of care cert/re-cert      Subjective Assessment - 01/07/15 0805    Subjective "I'm really dizzy. Not spinning dizzy; but when I walk, I tend to tip backwards. It feels like my head is shaking inside when I'm walking. I have to really concentrate when I'm walking. I have to walk very close to walls and use a cane." Dizziness started 6 years ago.  Pt has seen ENT; they said there was nothing physically wrong with the inner ear. Pt reports nausea that accompanies dizziness. Pt does have headaches but they don't seem to be related to dizziness.   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 No/denies            [facility] PT Assessment - 01/07/15 0001    Assessment   Medical Diagnosis vertigo   Referring Provider Despina Arias, MD   Onset Date/Surgical Date 08/04/14   Precautions   Precautions Fall   Required Braces or Orthoses --   Restrictions   Weight Bearing Restrictions No   Balance Screen   Has the patient fallen in the past 6 months Yes   How many times? 1   Has the patient had a decrease in activity level because of a fear of falling?  Yes   Is the patient reluctant to leave their home because of a fear of falling?  No   Home Environment   Living Environment Private residence   Living Arrangements Spouse/significant other   Type of Home House   Home Access Stairs to enter   Entrance Stairs-Number of Steps 2   Entrance Stairs-Rails Can reach both   Home Layout Two level   Alternate Level Stairs-Number of Steps 16   Alternate Level Stairs-Rails Right  2 but cannot easily reach both   Home Equipment [place] - single point   Prior Function  Level of Independence Independent   Vocation Full time employment   Vocation Requirements works full time as Surveyor, quantity; currently on leave of absence   Leisure taking dog for walks   Cognition   Overall Cognitive Status Within Functional Limits for tasks assessed   Observation/Other Assessments   Other Surveys  Other Surveys   Dizziness Handicap Inventory (DHI)  84   Sensation   Light Touch Impaired by gross assessment  in B feet   Coordination   Gross Motor Movements are Fluid and Coordinated --   ROM / Strength   AROM / PROM / Strength Strength   Strength   Overall Strength Within functional limits for tasks performed   Transfers   Transfers Sit  to Stand;Stand to Sit   Sit to Stand 5: Supervision   Sit to Stand Details (indicate cue type and reason) using SPC; requires (S) due to disequilibrium   Stand to Sit 5: Supervision   Stand to Sit Details using SPC; requires (S) due to disequilibrium   Ambulation/Gait   Ambulation/Gait Yes   Ambulation/Gait Assistance 5: Supervision;4: Min guard   Ambulation/Gait Assistance Details min guard during turning, direction changes   Ambulation Distance (Feet) 190 Feet   Assistive device Straight cane   Gait Pattern Step-through pattern;Decreased arm swing - left;Decreased stride length;Decreased hip/knee flexion - right;Decreased hip/knee flexion - left;Right foot flat;Left foot flat;Decreased trunk rotation;Wide base of support  high guard position with LUE; en bloc turning   Ambulation Surface Level;Indoor   Gait velocity 2.34 ft/sec            Vestibular Assessment - 01/07/15 0001    Symptom Behavior   Type of Dizziness Comment  "feel like my head is a fog"; "head is shaking inside"   Frequency of Dizziness everytime pt stands up and moves around   Duration of Dizziness "it doesn't immediately get better; I have to wait"; it's better if I don't close my eyes   Aggravating Factors Turning head quickly;Turning body quickly;Activity in general   Relieving Factors Head stationary;Slow movements;Rest   Occulomotor Exam   Occulomotor Alignment Abnormal  L eye slightly abducted   Spontaneous Left beating nystagmus  not elicited on exam but noted with turning during gait   Gaze-induced Absent   Smooth Pursuits Saccades   Saccades Dysmetria   Comment Saccades: overshooting horizontal from L to midline and (L monocular) with vertical from inferior > superior to midline. Smooth pursuits saccadic R to L   Vestibulo-Occular Reflex   VOR 1 Head Only (x 1 viewing) very slow; L eye loses target consistently. Subjective dizziness increased from 5/10 to 8/10 after horizontal gaze x15 seconds.    VOR Cancellation Corrective saccades  L eye unable to maintain gaze               [facility] Adult PT Treatment/Exercise - 01/07/15 0001    Neuro Re-ed    Neuro Re-ed Details  Explained, demonstrated compensatory strategies for central vestibular impairments, including gaze fixation and turning eyes, head, then body. When subjective dizziness rating increased to 8/10 during VOR x1, dizziness returned to 0/10 after use of gaze fixation in seated. Pt also reported less disequilibrium and exhibited improved gait stability with use of gaze fixation and eyes/head/body turning during ambulation.                  PT Short Term Goals - 01/07/15 0929    PT SHORT TERM GOAL #1   Title  Pt will perform initial HEP with mod I using paper handout to maximize progress made in PT. Target date: 02/04/15   PT SHORT TERM GOAL #2   Title During gait x150', pt will utilize compensatory strategies for dizziness/disequilibrium without cueing to increase pt safety with ambulation. Target date: 02/04/15   PT SHORT TERM GOAL #3   Title Pt will ambulate x200' over level, indoor surfaces with effective obstacle negotiation with mod I using LRAD to indicate safety with household mobility. Target date: 02/04/15   PT SHORT TERM GOAL #4   Title Pt will improve gait speed from 2.34 ft/sec to > 2.62 ft/sec to indicate status of community ambulatory. Target date: 02/04/15   PT SHORT TERM GOAL #5   Title Pt will negotiate 16 stairs with single rail with with no more than 2-point increase in subjective dizziness to increase pt tolerance to stair negotiation in home. Target date: 02/06/15           PT Long Term Goals - 01/07/15 1124    PT LONG TERM GOAL #1   Title Pt will perform dynamic standing task for 10 consecutive minutes with no more than 3-point increase in subjective dizziness to indicate pt ability to unload dishwasher. Target date: 03/04/15   PT LONG TERM GOAL #2   Title Pt will decrease DHI score from 84  to </= 61 to indicate decreased pt-perceived disability due to dizziness.  Target date: 03/04/15   PT LONG TERM GOAL #3   Title Pt will independently negotiate 16 stairs with single rail using reciprocal pattern to indicate increased stability/independence accessing second floor of home. Target date: 03/04/15   PT LONG TERM GOAL #4   Title Pt will ambulate 500' over unlevel, paved surfaces with mod I using LRAD to indicate increased safety with community mobility. Target date: 03/04/15   PT LONG TERM GOAL #5   Title Pt will report ability to her walk dog without loss of balance to indicate safe return to leisure activities. Target date: 03/04/15               Plan - 01/07/15 1610    Clinical Impression Statement Pt is a 54 y/o F with MS referred to outpatient neuro PT to address functional impairments associated with dizziness. PT evaluation reveals the following: gait impairments, decreased stability/independence with functional mobility; significant (up to 8/10) with any activity and with turning head/body; impaired convergence of L eye; decreased occulomotor coordination; impaired VOR cancellation; gait speed indicating status of limited community ambulator;; DHI score suggestive of significant pt-perceived disability due to dizziness. Pt will benefit from skilled outpatient PT 2x/week for up to 8 weeks to address said impairments.   Pt will benefit from skilled therapeutic intervention in order to improve on the following deficits Dizziness;Decreased balance;Decreased activity tolerance;Abnormal gait;Decreased coordination;Impaired vision/preception   Rehab Potential Good   PT Frequency 2x / week   PT Duration 8 weeks   PT Treatment/Interventions ADLs/Self Care Home Management;Gait training;Stair training;Functional mobility training;Therapeutic activities;Vestibular;Visual/perceptual remediation/compensation;Neuromuscular re-education;Patient/family education;Therapeutic exercise;Balance  training   PT Next Visit Plan Ask about use of compensatory strategies; attempt adaptation.   Consulted and Agree with Plan of Care Patient;Family member/caregiver   Family Member Consulted husband, Mardelle Matte         Problem List Patient Active Problem List   Diagnosis Date Noted  . Multiple sclerosis (HCC) 10/26/2014  . Benign paroxysmal positional vertigo 10/12/2014  . 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 Urinary frequency 10/12/2014  . Other fatigue 10/12/2014  .  Numbness 10/12/2014    Jorje Guild, PT, DPT [facility] [address] [place], [place], [address] Phone: [phone]   Fax:  [phone] 01/07/2015, 11:42 AM   Name: Crystal Park MRN: [id number] Date of Birth: 1960/11/09

## 2015-01-11 ENCOUNTER — Ambulatory Visit: Payer: Managed Care, Other (non HMO) | Admitting: Physical Therapy

## 2015-01-11 DIAGNOSIS — R269 Unspecified abnormalities of gait and mobility: Secondary | ICD-10-CM

## 2015-01-11 DIAGNOSIS — R2681 Unsteadiness on feet: Secondary | ICD-10-CM

## 2015-01-11 DIAGNOSIS — R42 Dizziness and giddiness: Secondary | ICD-10-CM

## 2015-01-11 NOTE — Patient Instructions (Signed)
Gaze Stabilization: Tip Card 1.Target must remain in focus, not blurry, and appear stationary while head is in motion. 2.Perform exercises with small head movements (45 to either side of midline). 3.Increase speed of head motion so long as target is in focus. 4.If you wear eyeglasses, be sure you can see target through lens (therapist will give specific instructions for bifocal / progressive lenses). 5.These exercises may provoke dizziness or nausea. Work through these symptoms. If too dizzy, slow head movement slightly. Rest between each exercise. 6.Exercises demand concentration; avoid distractions. 7.For safety, perform standing exercises close to a counter, wall, corner, or next to someone.  Copyright  VHI. All rights reserved.  Gaze Stabilization: Sitting   Keeping eyes on target on wall 3 feet away, tilt head down slightly and move head side to side for 10 times. Do __2-3__ sessions per day. Move head up and down 5 times. Do 2-3 sessions per day.    Tip Card 1.The goal of habituation training is to assist in decreasing symptoms of vertigo, dizziness, or nausea provoked by specific head and body motions. 2.These exercises may initially increase symptoms; however, be persistent and work through symptoms. With repetition and time, the exercises will assist in reducing or eliminating symptoms. 3.Exercises should be stopped and discussed with the therapist if you experience any of the following: - Sudden change or fluctuation in hearing - New onset of ringing in the ears, or increase in current intensity - Any fluid discharge from the ear - Severe pain in neck or back - Extreme nausea - Dizziness rating increases by 3 or more points from baseline.  Feet Apart, Head Motion - Eyes Open   With eyes open, feet apart, move head slowly: side to side 5 times. Do __3__ sessions per day.  Seated Head Nod       In seated, move head slowly: up and down 10 times. Do __3__ sessions per  day.

## 2015-01-11 NOTE — Therapy (Signed)
[facility] [address] [place], [place], [address] Phone: [phone]   Fax:  [phone]  Physical Therapy Treatment  Patient Details  Name: Crystal Park MRN: [id number] Date of Birth: 11-17-1960 Referring Provider: Despina Arias, MD  Encounter Date: 01/11/2015      PT End of Session - 01/11/15 2050    Visit Number 2   Number of Visits 17   Date for PT Re-Evaluation 03/08/15   Authorization Type [employer] - 20 visit limit for PT   PT Start Time 1100   PT Stop Time 1147   PT Time Calculation (min) 47 min   Activity Tolerance Other (comment)  limited by dizziness/disequilibrium   Behavior During Therapy [facility] for tasks assessed/performed      Past Medical History  Diagnosis Date  . Benign paroxysmal positional vertigo 10/12/2014    No past surgical history on file.  There were no vitals filed for this visit.  Visit Diagnosis:  Dizziness and giddiness  Abnormality of gait  Unsteadiness      Subjective Assessment - 01/11/15 1105    Subjective Pt reports she has been trying to use compensatory strategies while walking. Reports, "I took the sheets off of the bed today and I felt pretty horrible for a while after than." Pt reports vision is not blurred when head is still. Baseline subjective dizziness: 4/10.    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 No/denies                Vestibular Assessment - 01/11/15 0001    Positional Sensitivities   Head Turning x 5 Mild dizziness   Head Nodding x 5 Severe dizziness  Unable to  tolerate > 3 reps in standing                 [facility] Adult PT Treatment/Exercise - 01/11/15 0001    Ambulation/Gait   Ambulation/Gait Yes   Ambulation/Gait Assistance 5: Supervision   Ambulation Distance (Feet) 210 Feet  then x115'   Assistive device Straight cane   Gait Pattern Step-through pattern;Decreased arm swing - left;Decreased stride length;Right foot flat;Left foot flat;Decreased trunk rotation;Wide base of support  high guard position with LUE; en bloc turning   Ambulation Surface Level;Indoor   Gait Comments Pt consistently utilized gaze fixation during gait; required subtle to min cueing to turn eyes, then head during gait.         Vestibular Treatment/Exercise - 01/11/15 0001    Vestibular Treatment/Exercise   Vestibular Treatment Provided Habituation;Gaze   Habituation Exercises Seated Vertical Head Turns;Standing Horizontal Head Turns;Standing Vertical Head Turns   Gaze Exercises X1 Viewing Horizontal;X1 Viewing Vertical   Seated Vertical Head Turns   Number of Reps  10  very slowly   Symptom Description  6/10   Standing Horizontal Head Turns   Number of Reps  5   Symptom Description  6/10   Standing Vertical Head Turns   Number of Reps  3  unable to perform >3 in standing   Symptom Description  7/10   X1 Viewing Horizontal   Foot Position  seated   Reps 10   Comments intermittently loses target with L eye   X1 Viewing Vertical   Foot Position seated   Reps 10   Comments very slow head movement               PT Education - 01/11/15 2041    Education provided Yes   Education Details HEP initiated; See Pt Instructions.   Person(s) Educated Patient   Methods Explanation;Demonstration;Verbal cues;Handout   Comprehension Verbalized understanding;Returned demonstration          PT Short Term Goals - 01/07/15 0929    PT SHORT TERM GOAL #1   Title Pt will perform initial HEP with mod I using paper handout to maximize progress made in PT.  Target date: 02/04/15   PT SHORT TERM GOAL #2   Title During gait x150', pt will utilize compensatory strategies for dizziness/disequilibrium without cueing to increase pt safety with ambulation. Target date: 02/04/15   PT SHORT TERM GOAL #3   Title Pt will ambulate x200' over level, indoor surfaces with effective obstacle negotiation with mod I using LRAD to indicate safety with household mobility. Target date: 02/04/15   PT SHORT TERM GOAL #4   Title Pt will improve gait speed from 2.34 ft/sec to > 2.62 ft/sec to indicate status of community ambulatory. Target date: 02/04/15   PT SHORT TERM GOAL #5   Title Pt will negotiate 16 stairs with single rail with with no more than 2-point increase in subjective dizziness to increase pt tolerance to stair negotiation in home. Target date: 02/06/15           PT Long Term Goals - 01/07/15 1124    PT LONG TERM GOAL #1   Title Pt will perform dynamic standing task for 10 consecutive minutes with no more than 3-point increase in subjective dizziness to indicate pt ability to unload dishwasher. Target date: 03/04/15   PT LONG TERM GOAL #2   Title Pt will decrease DHI score from 84 to </= 61 to indicate decreased pt-perceived disability due to dizziness.  Target date: 03/04/15   PT LONG TERM GOAL #3   Title Pt will independently negotiate 16 stairs with single rail using reciprocal pattern to indicate increased stability/independence accessing second floor of home. Target date: 03/04/15   PT LONG TERM GOAL #4   Title Pt will ambulate 500' over unlevel, paved surfaces with mod I using LRAD to indicate increased safety with community mobility. Target date: 03/04/15   PT LONG TERM GOAL #5   Title Pt will report ability to her walk dog without loss of balance to indicate safe return to leisure activities. Target date: 03/04/15               Plan - 01/11/15 2052    Clinical Impression Statement Session focused on initiating HEP to address gaze stability  and motion sensitivity. Pt required frequent prolonged rest breaks due to significant dizziness/disequilibrium with functional head turns.   Pt will benefit from skilled therapeutic intervention in order to improve on the following deficits Dizziness;Decreased balance;Decreased activity tolerance;Abnormal gait;Decreased coordination;Impaired vision/preception   Rehab Potential Good   PT Frequency 2x / week   PT Duration 8 weeks   PT Treatment/Interventions ADLs/Self Care Home Management;Gait training;Stair training;Functional mobility training;Therapeutic activities;Vestibular;Visual/perceptual remediation/compensation;Neuromuscular re-education;Patient/family education;Therapeutic exercise;Balance training   PT Next Visit Plan Check pt performance of HEP and monitor for improvement in symptoms.   Consulted and Agree with Plan of Care Patient  Problem List Patient Active Problem List   Diagnosis Date Noted  . Multiple sclerosis (HCC) 10/26/2014  . Benign paroxysmal positional vertigo 10/12/2014  . 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 Urinary frequency 10/12/2014  . Other fatigue 10/12/2014  . Numbness 10/12/2014    Jorje Guild, PT, DPT [facility] [address] [place], [place], [address] Phone: [phone]   Fax:  [phone] 01/11/2015, 8:59 PM  Name: Crystal Park MRN: [id number] Date of Birth: 1960-04-04

## 2015-01-14 ENCOUNTER — Ambulatory Visit: Payer: Managed Care, Other (non HMO) | Admitting: Physical Therapy

## 2015-01-14 DIAGNOSIS — R42 Dizziness and giddiness: Secondary | ICD-10-CM

## 2015-01-14 DIAGNOSIS — R2681 Unsteadiness on feet: Secondary | ICD-10-CM

## 2015-01-14 DIAGNOSIS — R269 Unspecified abnormalities of gait and mobility: Secondary | ICD-10-CM

## 2015-01-14 NOTE — Patient Instructions (Addendum)
Gaze Stabilization: Tip Card 1.Target must remain in focus, not blurry, and appear stationary while head is in motion. 2.Perform exercises with small head movements (45 to either side of midline). 3.Increase speed of head motion so long as target is in focus. 4.If you wear eyeglasses, be sure you can see target through lens (therapist will give specific instructions for bifocal / progressive lenses). 5.These exercises may provoke dizziness or nausea. Work through these symptoms. If too dizzy, slow head movement slightly. Rest between each exercise. 6.Exercises demand concentration; avoid distractions. 7.For safety, perform standing exercises close to a counter, wall, corner, or next to someone.    Gaze Stabilization: Sitting   Keeping eyes on target on wall 6 feet away, tilt head down slightly and move head side to side for 10 times. Do __2-3__ sessions per day. Move head up and down 5 times. Do 2-3 sessions per day.    Tip Card 1.The goal of habituation training is to assist in decreasing symptoms of vertigo, dizziness, or nausea provoked by specific head and body motions. 2.These exercises may initially increase symptoms; however, be persistent and work through symptoms. With repetition and time, the exercises will assist in reducing or eliminating symptoms. 3.Exercises should be stopped and discussed with the therapist if you experience any of the following: - Sudden change or fluctuation in hearing - New onset of ringing in the ears, or increase in current intensity - Any fluid discharge from the ear - Severe pain in neck or back - Extreme nausea - Dizziness rating increases by 3 or more points from baseline.  Feet Apart, Head Motion - Eyes Open   With eyes open, feet apart, move head slowly: side to side 10 times. Do __3__ sessions per day.  Seated Head Nod      In seated, move head slowly: up and down 5 times. Do __3__ sessions per day.   Eye Tracking:  Convergence     1. Hold a pen on front of you at arm's length. The pen should be vertical, with the tip at the top.The pen should be directly in front of your face, with the tip just below eye level.  2. Move the pencil slowly toward your face (all the way to your nose) as you concentrate and focus on the point. Soon you'll notice that you see two pens rather than one OR the pen begins shaking. Stop.  3. Look away from the pen briefly to rest your eyes. Focus on something across the room for two or three seconds, and then look back at the pen point where you've stopped it close to your face. Look at the pen point carefully, and to try to focus so that the double vision Or shaking disappears and you only see one, still pen.  4. Move the pen back out to arm's length when you are able to rid yourself of double vision. If this takes more than a few seconds, look away and try again. Once you are able to accomplish it, move the pen back out to arm's length and complete the exercise again.  Try to perform this exercise 5-10 minutes per day.

## 2015-01-15 NOTE — Therapy (Signed)
[facility] [address] [place], [place], [address] Phone: [phone]   Fax:  [phone]  Physical Therapy Treatment  Patient Details  Name: Crystal Park MRN: [id number] Date of Birth: [birth year] Referring Provider: Arlice Colt, MD  Encounter Date: 01/14/2015      PT End of Session - 01/15/15 1611    Visit Number 3   Number of Visits 17   Date for PT Re-Evaluation 03/08/15   Authorization Type [employer] - 20 visit limit for PT   Authorization - Visit Number 3   Authorization - Number of Visits 20   PT Start Time 0801   PT Stop Time 0846   PT Time Calculation (min) 45 min   Activity Tolerance Patient tolerated treatment well   Behavior During Therapy [facility] for tasks assessed/performed      Past Medical History  Diagnosis Date  . Benign paroxysmal positional vertigo 10/12/2014    No past surgical history on file.  There were no vitals filed for this visit.  Visit Diagnosis:  Dizziness and giddiness  Abnormality of gait  Unsteadiness      Subjective Assessment - 01/14/15 0837    Subjective Pt noting improved tolerance to riding in car and less "fogginess in the morning" since starting PT.   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 No/denies                         [facility] Adult PT Treatment/Exercise - 01/15/15 1606    Ambulation/Gait   Ambulation/Gait Yes   Ambulation/Gait Assistance 5: Supervision   Ambulation Distance (Feet) 230 Feet   Assistive device Straight cane   Gait Pattern Step-through  pattern;Decreased arm swing - left;Decreased stride length;Right foot flat;Left foot flat;Wide base of support  high guard position with LUE; en bloc turning   Ambulation Surface Level;Indoor   Gait Comments Pt consistently utilized gaze fixation during gait; required subtle to min cueing to turn eyes, then head during gait.   Neuro Re-ed    Neuro Re-ed Details  In ADL kitchen, performed pt moved dishes from countertop to overhead shelf using visual targets (A) on countertop and on inside of cabinet to decrease disequilibrium and increase tolerance to activity. During 6-minute activity, pt required seated rest break x1 due to increased (7/10) dizziness. Discussed strategies to increase pt tolerance to household tasks, such as use of visual targets, sitting for portions of tasks, or using gaze stabilization to decrease recovery time uring rest breaks.                PT Education - 01/15/15 1605    Education provided Yes   Education Details HEP: increase distance of targets for VOR x1; added convergence exercise.   Person(s) Educated Patient   Methods Explanation;Demonstration;Verbal cues;Handout   Comprehension Returned demonstration;Verbalized understanding          PT Short Term Goals - 01/07/15 0929    PT SHORT TERM GOAL #1   Title Pt will perform initial HEP with mod I using paper handout to maximize progress made in  PT. Target date: 02/04/15   PT SHORT TERM GOAL #2   Title During gait x150', pt will utilize compensatory strategies for dizziness/disequilibrium without cueing to increase pt safety with ambulation. Target date: 02/04/15   PT SHORT TERM GOAL #3   Title Pt will ambulate x200' over level, indoor surfaces with effective obstacle negotiation with mod I using LRAD to indicate safety with household mobility. Target date: 02/04/15   PT SHORT TERM GOAL #4   Title Pt will improve gait speed from 2.34 ft/sec to > 2.62 ft/sec to indicate status of community ambulatory. Target  date: 02/04/15   PT SHORT TERM GOAL #5   Title Pt will negotiate 16 stairs with single rail with with no more than 2-point increase in subjective dizziness to increase pt tolerance to stair negotiation in home. Target date: 02/06/15           PT Long Term Goals - 01/07/15 1124    PT LONG TERM GOAL #1   Title Pt will perform dynamic standing task for 10 consecutive minutes with no more than 3-point increase in subjective dizziness to indicate pt ability to unload dishwasher. Target date: 03/04/15   PT LONG TERM GOAL #2   Title Pt will decrease DHI score from 84 to </= 61 to indicate decreased pt-perceived disability due to dizziness.  Target date: 03/04/15   PT LONG TERM GOAL #3   Title Pt will independently negotiate 16 stairs with single rail using reciprocal pattern to indicate increased stability/independence accessing second floor of home. Target date: 03/04/15   PT LONG TERM GOAL #4   Title Pt will ambulate 500' over unlevel, paved surfaces with mod I using LRAD to indicate increased safety with community mobility. Target date: 03/04/15   PT LONG TERM GOAL #5   Title Pt will report ability to her walk dog without loss of balance to indicate safe return to leisure activities. Target date: 03/04/15               Plan - 01/14/15 0835    Clinical Impression Statement Pt met STG 1 for HEP performance. PT session focused on performance of using compensatory strategies (visual targeting) during household tasks, such as unloading dishwasher.  Modified gaze stabilization to increase target distance from 3" to 6" due to decreased L eye convergence; subjective dizziness lower with further target. Pt noted improved tolerance to riding in car and less "fogginess in the morning" since starting PT.    Pt will benefit from skilled therapeutic intervention in order to improve on the following deficits Dizziness;Decreased balance;Decreased activity tolerance;Abnormal gait;Decreased  coordination;Impaired vision/preception   Rehab Potential Good   PT Frequency 2x / week   PT Duration 8 weeks   PT Treatment/Interventions ADLs/Self Care Home Management;Gait training;Stair training;Functional mobility training;Therapeutic activities;Vestibular;Visual/perceptual remediation/compensation;Neuromuscular re-education;Patient/family education;Therapeutic exercise;Balance training   PT Next Visit Plan Assess stair negotiation. Progress HEP, as appropriate.   Consulted and Agree with Plan of Care Patient        Problem List Patient Active Problem List   Diagnosis Date Noted  . Multiple sclerosis ([place]) 10/26/2014  . Benign paroxysmal positional vertigo 10/12/2014  . 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 Urinary frequency 10/12/2014  . Other fatigue 10/12/2014  . Numbness 10/12/2014    Billie Ruddy, PT, DPT [facility] [address] [place], [place], [address] Phone: [phone]   Fax:  [phone] 01/15/2015, 4:15 PM  Name: LIVIAH CAKE MRN: [id number] Date of Birth: [birth year]

## 2015-01-19 ENCOUNTER — Telehealth: Payer: Self-pay | Admitting: *Deleted

## 2015-01-19 ENCOUNTER — Ambulatory Visit: Payer: Managed Care, Other (non HMO) | Admitting: Physical Therapy

## 2015-01-19 DIAGNOSIS — R42 Dizziness and giddiness: Secondary | ICD-10-CM | POA: Diagnosis not present

## 2015-01-19 DIAGNOSIS — R2681 Unsteadiness on feet: Secondary | ICD-10-CM

## 2015-01-19 DIAGNOSIS — R269 Unspecified abnormalities of gait and mobility: Secondary | ICD-10-CM

## 2015-01-19 NOTE — Therapy (Signed)
[facility] [address] [place], [place], [address] Phone: [phone]   Fax:  [phone]  Physical Therapy Treatment  Patient Details  Name: Crystal Park MRN: [id number] Date of Birth: 11/25/1960 Referring Provider: Despina Arias, MD  Encounter Date: 01/19/2015      PT End of Session - 01/19/15 0905    Visit Number 4   Number of Visits 17   Date for PT Re-Evaluation 03/08/15   Authorization Type [employer] - 20 visit limit for PT   Authorization - Visit Number 4   Authorization - Number of Visits 20   PT Start Time 0803   PT Stop Time 0853   PT Time Calculation (min) 50 min   Activity Tolerance Patient tolerated treatment well   Behavior During Therapy [facility] for tasks assessed/performed      Past Medical History  Diagnosis Date  . Benign paroxysmal positional vertigo 10/12/2014    No past surgical history on file.  There were no vitals filed for this visit.  Visit Diagnosis:  Dizziness and giddiness  Abnormality of gait  Unsteadiness      Subjective Assessment - 01/19/15 0805    Subjective "I'm still feeling dizzy and off-balance. When I do the up and down head movements, it's terrible. 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 No/denies                Vestibular Assessment - 01/19/15 0001    Occulomotor Exam   Comment (+) R Head Thrust Test with accompanying disequilibrium   Positional Sensitivities   Supine to Left Side Moderate dizziness   Supine to Right Side Moderate  dizziness   Rolling Right Moderate dizziness   Rolling Left Severe dizziness                 [facility] Adult PT Treatment/Exercise - 01/19/15 0001    Ambulation/Gait   Ambulation/Gait Yes   Ambulation/Gait Assistance 5: Supervision   Ambulation Distance (Feet) 414 Feet   Assistive device Straight cane   Gait Pattern Step-through pattern;Decreased arm swing - left;Decreased stride length;Right foot flat;Left foot flat;Wide base of support  high guard position with LUE; en bloc turning   Ambulation Surface Level;Indoor   Stairs Yes   Stairs Assistance 5: Supervision   Stairs Assistance Details (indicate cue type and reason) 4 stairs x3 trials laterally with cueing for BUE support at L rail. Cueing for step-to pattern and safe foot placement on step.   Stair Management Technique One rail Left;Step to pattern;Sideways   Number of Stairs 12   Height of Stairs 6   Gait Comments With cueing from PT, pt utilized compensatory strategies (gaze fixation and turning eyes, then head, then body) during gait training.         Vestibular Treatment/Exercise - 01/19/15 0001    Vestibular Treatment/Exercise   Vestibular Treatment Provided Habituation   Habituation Exercises [name];Horizontal Roll               PT Education - 01/19/15 [phone]  Education provided Yes   Education Details Lateral stair negotiation technique. Compensatory strategies. Modified HEP to include habituation; see pt instructions.   Person(s) Educated Patient   Methods Explanation;Demonstration;Verbal cues;Handout   Comprehension Verbalized understanding;Returned demonstration          PT Short Term Goals - 01/07/15 0929    PT SHORT TERM GOAL #1   Title Pt will perform initial HEP with mod I using paper handout to maximize progress made in PT. Target date: 02/04/15   PT SHORT TERM GOAL #2   Title During gait x150', pt will utilize compensatory strategies for dizziness/disequilibrium without cueing to  increase pt safety with ambulation. Target date: 02/04/15   PT SHORT TERM GOAL #3   Title Pt will ambulate x200' over level, indoor surfaces with effective obstacle negotiation with mod I using LRAD to indicate safety with household mobility. Target date: 02/04/15   PT SHORT TERM GOAL #4   Title Pt will improve gait speed from 2.34 ft/sec to > 2.62 ft/sec to indicate status of community ambulatory. Target date: 02/04/15   PT SHORT TERM GOAL #5   Title Pt will negotiate 16 stairs with single rail with with no more than 2-point increase in subjective dizziness to increase pt tolerance to stair negotiation in home. Target date: 02/06/15           PT Long Term Goals - 01/07/15 1124    PT LONG TERM GOAL #1   Title Pt will perform dynamic standing task for 10 consecutive minutes with no more than 3-point increase in subjective dizziness to indicate pt ability to unload dishwasher. Target date: 03/04/15   PT LONG TERM GOAL #2   Title Pt will decrease DHI score from 84 to </= 61 to indicate decreased pt-perceived disability due to dizziness.  Target date: 03/04/15   PT LONG TERM GOAL #3   Title Pt will independently negotiate 16 stairs with single rail using reciprocal pattern to indicate increased stability/independence accessing second floor of home. Target date: 03/04/15   PT LONG TERM GOAL #4   Title Pt will ambulate 500' over unlevel, paved surfaces with mod I using LRAD to indicate increased safety with community mobility. Target date: 03/04/15   PT LONG TERM GOAL #5   Title Pt will report ability to her walk dog without loss of balance to indicate safe return to leisure activities. Target date: 03/04/15               Plan - 01/19/15 0910    Clinical Impression Statement Skilled session focused on use of compensatory strategies to increase pt tolerance to/stability with ambulation; modification of HEP to focus on rolling and sit <> side lying for habituation. With R Head Thrust Test,  noted corrective saccade and pt-reported disequilibrium.   Pt will benefit from skilled therapeutic intervention in order to improve on the following deficits Dizziness;Decreased balance;Decreased activity tolerance;Abnormal gait;Decreased coordination;Impaired vision/preception   Rehab Potential Good   PT Frequency 2x / week   PT Duration 8 weeks   PT Treatment/Interventions ADLs/Self Care Home Management;Gait training;Stair training;Functional mobility training;Therapeutic activities;Vestibular;Visual/perceptual remediation/compensation;Neuromuscular re-education;Patient/family education;Therapeutic exercise;Balance training   PT Next Visit Plan Ask about habituation (rolling, sit <> side lying). Reassess motion sensitivity. Progress HEP, as appropriate/tolerated.   Consulted and Agree with Plan of Care Patient        Problem List Patient Active Problem List   Diagnosis Date Noted  . Multiple sclerosis (HCC) 10/26/2014  . Benign paroxysmal positional vertigo 10/12/2014  .  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 Urinary frequency 10/12/2014  . Other fatigue 10/12/2014  . Numbness 10/12/2014    Jorje Guild, PT, DPT [facility] [address] [place], [place], [address] Phone: [phone]   Fax:  [phone] 01/19/2015, 9:14 AM   Name: Crystal Park MRN: [id number] Date of Birth: 09-16-1960

## 2015-01-19 NOTE — Patient Instructions (Addendum)
Gaze Stabilization: Tip Card 1.Target must remain in focus, not blurry, and appear stationary while head is in motion. 2.Perform exercises with small head movements (45 to either side of midline). 3.Increase speed of head motion so long as target is in focus. 4.If you wear eyeglasses, be sure you can see target through lens (therapist will give specific instructions for bifocal / progressive lenses). 5.These exercises may provoke dizziness or nausea. Work through these symptoms. If too dizzy, slow head movement slightly. Rest between each exercise. 6.Exercises demand concentration; avoid distractions. 7.For safety, perform standing exercises close to a counter, wall, corner, or next to someone.   Gaze Stabilization: Sitting   Keeping eyes on target on wall 6 feet away, tilt head down slightly and move head side to side for 10 times. Do __2-3__ sessions per day. Move head up and down 5 times. Do 2-3 sessions per day.    Habituation Tip Card 1.The goal of habituation training is to assist in decreasing symptoms of vertigo, dizziness, or nausea provoked by specific head and body motions. 2.These exercises may initially increase symptoms; however, be persistent and work through symptoms. With repetition and time, the exercises will assist in reducing or eliminating symptoms. 3.Exercises should be stopped and discussed with the therapist if you experience any of the following: - Sudden change or fluctuation in hearing - New onset of ringing in the ears, or increase in current intensity - Any fluid discharge from the ear - Severe pain in neck or back - Extreme nausea - Dizziness rating increases by 3 or more points from baseline.  Rolling   With pillow under head, start on back. Roll to your right side.  Hold until dizziness stops, plus 20 seconds and then roll to the left side.  Hold until dizziness stops, plus 20 seconds.  Repeat sequence 5 times per session. Do 2 sessions per day.   Sit  to Side-Lying   Sit on edge of bed. Lie down onto the right side and hold until dizziness stops, plus 20 seconds.  Return to sitting and wait until dizziness stops, plus 20 seconds.  Repeat to the left side. Repeat sequence 5 times per session. Do 2 sessions per day.    Eye Tracking: Convergence     1. Hold a pen on front of you at arm's length. The pen should be vertical, with the tip at the top.The pen should be directly in front of your face, with the tip just below eye level.  2. Move the pencil slowly toward your face (all the way to your nose) as you concentrate and focus on the point. Soon you'll notice that you see two pens rather than one OR the pen begins shaking. Stop.  3. Look away from the pen briefly to rest your eyes. Focus on something across the room for two or three seconds, and then look back at the pen point where you've stopped it close to your face. Look at the pen point carefully, and to try to focus so that the double vision Or shaking disappears and you only see one, still pen.  4. Move the pen back out to arm's length when you are able to rid yourself of double vision. If this takes more than a few seconds, look away and try again. Once you are able to accomplish it, move the pen back out to arm's length and complete the exercise again.  Try to perform this exercise 5-10 minutes per day.   Compensation Turning in  Place: Compensatory Strategy    Standing in place, first move eyes to target at eye level located to right side. Keeping eyes on target, turn head then body toward target. Repeat sequence with target on each wall to complete full turn.  Repeat __8__ times per session. Do __2__ sessions per day. If your dizziness increases by 3 or more from baseline, stop and visually focus on the "A" until your dizziness returns to your baseline.

## 2015-01-19 NOTE — Telephone Encounter (Signed)
Patient form on Faith desk. 

## 2015-01-20 DIAGNOSIS — Z0289 Encounter for other administrative examinations: Secondary | ICD-10-CM

## 2015-01-21 ENCOUNTER — Ambulatory Visit: Payer: Managed Care, Other (non HMO) | Admitting: Physical Therapy

## 2015-01-21 DIAGNOSIS — R42 Dizziness and giddiness: Secondary | ICD-10-CM

## 2015-01-21 DIAGNOSIS — R269 Unspecified abnormalities of gait and mobility: Secondary | ICD-10-CM

## 2015-01-21 DIAGNOSIS — R2681 Unsteadiness on feet: Secondary | ICD-10-CM

## 2015-01-21 NOTE — Patient Instructions (Addendum)
Gaze Stabilization: Tip Card 1.Target must remain in focus, not blurry, and appear stationary while head is in motion. 2.Perform exercises with small head movements (45 to either side of midline). 3.Increase speed of head motion so long as target is in focus. 4.If you wear eyeglasses, be sure you can see target through lens (therapist will give specific instructions for bifocal / progressive lenses). 5.These exercises may provoke dizziness or nausea. Work through these symptoms. If too dizzy, slow head movement slightly. Rest between each exercise. 6.Exercises demand concentration; avoid distractions. 7.For safety, perform standing exercises close to a counter, wall, corner, or next to someone.   Gaze Stabilization: Sitting   Keeping eyes on target on wall 6 feet away, tilt head down slightly and move head side to side for 10 times. Do __2-3__ sessions per day. Move head up and down 5 times. Do 2-3 sessions per day.    Habituation Tip Card 1.The goal of habituation training is to assist in decreasing symptoms of vertigo, dizziness, or nausea provoked by specific head and body motions. 2.These exercises may initially increase symptoms; however, be persistent and work through symptoms. With repetition and time, the exercises will assist in reducing or eliminating symptoms. 3.Exercises should be stopped and discussed with the therapist if you experience any of the following: - Sudden change or fluctuation in hearing - New onset of ringing in the ears, or increase in current intensity - Any fluid discharge from the ear - Severe pain in neck or back - Extreme nausea - - - Dizziness rating increases by 2 or more points from baseline if you start at baseline 4 or more. - Dizziness rating increases by 3 or more points from baseline if you start baseline less than 4.  Rolling   With pillow under head, start on back. Roll to your right side. Hold until dizziness stops, plus 20 seconds and then  roll to the left side. Hold until dizziness stops, plus 20 seconds. Repeat sequence 5 times per session. Do 2 sessions per day.  Sit to Side-Lying   Sit on edge of bed. Lie down onto the right side and hold until dizziness stops, plus 20 seconds. Return to sitting and wait until dizziness stops, plus 20 seconds. Repeat to the left side. Repeat sequence 5 times per session. Do 2 sessions per day.  Feet Apart, Head Motion - Eyes Open   Stand with both hands on stable surface. With eyes open, feet apart, move head slowly: side to side 10 times; up and down 3 times.  Do __2_ sessions per day.   Eye Tracking: Convergence     1. Hold a pen on front of you at arm's length. The pen should be vertical, with the tip at the top.The pen should be directly in front of your face, with the tip just below eye level.  2. Move the pencil slowly toward your face (all the way to your nose) as you concentrate and focus on the point. Soon you'll notice that you see two pens rather than one OR the pen begins shaking. Stop.  3. Look away from the pen briefly to rest your eyes. Focus on something across the room for two or three seconds, and then look back at the pen point where you've stopped it close to your face. Look at the pen point carefully, and to try to focus so that the double vision Or shaking disappears and you only see one, still pen.  4. Move the pen back  out to arm's length when you are able to rid yourself of double vision. If this takes more than a few seconds, look away and try again. Once you are able to accomplish it, move the pen back out to arm's length and complete the exercise again.  Try to perform this exercise 5-10 minutes per day.   Compensation Turning in Place: Compensatory Strategy    Standing in place, first move eyes to target at eye level located to right side. Keeping eyes on target, turn head then body toward target. Repeat sequence with target on each wall to  complete full turn.  Repeat __8__ times per session. Do __2__ sessions per day. If your dizziness increases by 3 or more from baseline, stop and visually focus on the "A" until your dizziness returns to your baseline.

## 2015-01-21 NOTE — Therapy (Signed)
[facility] [address] [place], [place], [address] Phone: [phone]   Fax:  [phone]  Physical Therapy Treatment  Patient Details  Name: Crystal Park MRN: [id number] Date of Birth: 24-Jun-1960 Referring Provider: Despina Arias, MD  Encounter Date: 01/21/2015      PT End of Session - 01/21/15 1732    Visit Number 5   Number of Visits 17   Date for PT Re-Evaluation 03/08/15   Authorization Type [employer] - 20 visit limit for PT   Authorization - Visit Number 5   Authorization - Number of Visits 20   PT Start Time 0801   PT Stop Time 0846   PT Time Calculation (min) 45 min   Activity Tolerance Patient tolerated treatment well   Behavior During Therapy [facility] for tasks assessed/performed      Past Medical History  Diagnosis Date  . Benign paroxysmal positional vertigo 10/12/2014    No past surgical history on file.  There were no vitals filed for this visit.  Visit Diagnosis:  Dizziness and giddiness  Abnormality of gait  Unsteadiness      Subjective Assessment - 01/21/15 0803    Subjective "I'm still dizzy. Last night I hopped out of bed really quickly - I wouldn't normally be able to do that - but I felt it when I stopped." Pt also states, "At the end of the day, my dizziness is about an 8/10 because I'm so tired." Baseline subjective dizziness: 4/10.   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 No/denies                Vestibular Assessment - 01/21/15 0001    Positional Sensitivities   Supine to Left Side Moderate dizziness  "doesn't go  down as fast as when I lay on my right side"   Supine to Right Side Mild dizziness   Rolling Right Mild dizziness   Rolling Left Moderate dizziness   Positional Sensitivities Comments R side lying > sit = 3/5;  L side lying > sit = 3/10  overall, much less time required to recover                 [facility] Adult PT Treatment/Exercise - 01/21/15 0001    Ambulation/Gait   Ambulation/Gait Yes   Ambulation/Gait Assistance 5: Supervision   Ambulation Distance (Feet) 650 Feet   Assistive device Straight cane   Gait Pattern Step-through pattern;Decreased arm swing - left;Decreased stride length;Right foot flat;Left foot flat;Wide base of support   Ambulation Surface Level;Indoor;Unlevel;Outdoor;Paved   Gait velocity 2.53 ft/sec         Vestibular Treatment/Exercise - 01/21/15 0001    Vestibular Treatment/Exercise   Vestibular Treatment Provided Habituation   Habituation Exercises Brandt Daroff;Horizontal Roll;Seated Horizontal Head Turns;Seated Vertical Head Turns   Seated Vertical Head Turns   Number of Reps  5   Symptom Description  5/10  subjective dizziness decreased to 3/10 within 10 seconds   Standing Horizontal Head Turns   Number of Reps  10   Symptom Description  5/10  5/10 immediately after stopping; 3/10 within 5 seconds  PT Education - 01/21/15 1736    Education provided Yes   Education Details HEP progressed; see Pt Instructions.   Person(s) Educated Patient   Methods Explanation;Demonstration;Handout   Comprehension Verbalized understanding;Returned demonstration          PT Short Term Goals - 01/07/15 0929    PT SHORT TERM GOAL #1   Title Pt will perform initial HEP with mod I using paper handout to maximize progress made in PT. Target date: 02/04/15   PT SHORT TERM GOAL #2   Title During gait x150', pt will utilize compensatory strategies for dizziness/disequilibrium without cueing to increase pt safety with ambulation. Target date:  02/04/15   PT SHORT TERM GOAL #3   Title Pt will ambulate x200' over level, indoor surfaces with effective obstacle negotiation with mod I using LRAD to indicate safety with household mobility. Target date: 02/04/15   PT SHORT TERM GOAL #4   Title Pt will improve gait speed from 2.34 ft/sec to > 2.62 ft/sec to indicate status of community ambulatory. Target date: 02/04/15   PT SHORT TERM GOAL #5   Title Pt will negotiate 16 stairs with single rail with with no more than 2-point increase in subjective dizziness to increase pt tolerance to stair negotiation in home. Target date: 02/06/15           PT Long Term Goals - 01/07/15 1124    PT LONG TERM GOAL #1   Title Pt will perform dynamic standing task for 10 consecutive minutes with no more than 3-point increase in subjective dizziness to indicate pt ability to unload dishwasher. Target date: 03/04/15   PT LONG TERM GOAL #2   Title Pt will decrease DHI score from 84 to </= 61 to indicate decreased pt-perceived disability due to dizziness.  Target date: 03/04/15   PT LONG TERM GOAL #3   Title Pt will independently negotiate 16 stairs with single rail using reciprocal pattern to indicate increased stability/independence accessing second floor of home. Target date: 03/04/15   PT LONG TERM GOAL #4   Title Pt will ambulate 500' over unlevel, paved surfaces with mod I using LRAD to indicate increased safety with community mobility. Target date: 03/04/15   PT LONG TERM GOAL #5   Title Pt will report ability to her walk dog without loss of balance to indicate safe return to leisure activities. Target date: 03/04/15               Plan - 01/21/15 1733    Clinical Impression Statement Session focused on gait training, progression of habituation HEP. Noted decreased severity of disequilibrium with horizontal rolling, Austin Miles, and horziontal/vertical head turns. Therefore, re-added head turns to HEP. Pt also ambulated x650' over unlevel, outdoor  surfaces with no signfiicant increase in subjective dizziness/disequilibrium.   Pt will benefit from skilled therapeutic intervention in order to improve on the following deficits Dizziness;Decreased balance;Decreased activity tolerance;Abnormal gait;Decreased coordination;Impaired vision/preception   Rehab Potential Good   PT Frequency 2x / week   PT Duration 8 weeks   PT Treatment/Interventions ADLs/Self Care Home Management;Gait training;Stair training;Functional mobility training;Therapeutic activities;Vestibular;Visual/perceptual remediation/compensation;Neuromuscular re-education;Patient/family education;Therapeutic exercise;Balance training   PT Next Visit Plan Continue progressing functional head turns, to pt tolerance. Consider Dentist.   Consulted and Agree with Plan of Care Patient        Problem List Patient Active Problem List   Diagnosis Date Noted  . Multiple sclerosis (HCC) 10/26/2014  . Benign paroxysmal positional vertigo 10/12/2014  . 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 Urinary frequency 10/12/2014  . Other fatigue 10/12/2014  . Numbness 10/12/2014    Jorje Guild, PT, DPT [facility] [address] [place], [place], [address] Phone: [phone]   Fax:  [phone] 01/21/2015, 5:36 PM   Name: CHANICE BRENTON MRN: [id number] Date of Birth: 21-Nov-1960

## 2015-02-01 DIAGNOSIS — Z0289 Encounter for other administrative examinations: Secondary | ICD-10-CM

## 2015-02-02 ENCOUNTER — Ambulatory Visit: Payer: Managed Care, Other (non HMO) | Admitting: Physical Therapy

## 2015-02-02 DIAGNOSIS — R269 Unspecified abnormalities of gait and mobility: Secondary | ICD-10-CM

## 2015-02-02 DIAGNOSIS — R42 Dizziness and giddiness: Secondary | ICD-10-CM

## 2015-02-02 DIAGNOSIS — R2681 Unsteadiness on feet: Secondary | ICD-10-CM

## 2015-02-02 NOTE — Patient Instructions (Signed)
Gaze Stabilization: Tip Card 1.Target must remain in focus, not blurry, and appear stationary while head is in motion. 2.Perform exercises with small head movements (45 to either side of midline). 3.Increase speed of head motion so long as target is in focus. 4.If you wear eyeglasses, be sure you can see target through lens (therapist will give specific instructions for bifocal / progressive lenses). 5.These exercises may provoke dizziness or nausea. Work through these symptoms. If too dizzy, slow head movement slightly. Rest between each exercise. 6.Exercises demand concentration; avoid distractions. 7.For safety, perform standing exercises close to a counter, wall, corner, or next to someone.   Gaze Stabilization: Sitting   Keeping eyes on target on wall 6 feet away, tilt head down slightly and move head side to side for 10 times. Do __2-3__ sessions per day. Move head up and down 5 times. Do 2-3 sessions per day.    Habituation Tip Card 1.The goal of habituation training is to assist in decreasing symptoms of vertigo, dizziness, or nausea provoked by specific head and body motions. 2.These exercises may initially increase symptoms; however, be persistent and work through symptoms. With repetition and time, the exercises will assist in reducing or eliminating symptoms. 3.Exercises should be stopped and discussed with the therapist if you experience any of the following: - Sudden change or fluctuation in hearing - New onset of ringing in the ears, or increase in current intensity - Any fluid discharge from the ear - Severe pain in neck or back - Extreme nausea - - - Dizziness rating increases by 2 or more points from baseline if you start at baseline 4 or more. - Dizziness rating increases by 3 or more points from baseline if you start baseline less than 4.  Rolling   With pillow under head, start on back. Roll to your right side. Hold until dizziness stops, plus 20 seconds and then  roll to the left side. Hold until dizziness stops, plus 20 seconds. Repeat sequence 5 times per session. Do 2 sessions per day.  Sit to Side-Lying   Sit on edge of bed. Lie down onto the right side and hold until dizziness stops, plus 20 seconds. Return to sitting and wait until dizziness stops, plus 20 seconds. Repeat to the left side. Repeat sequence 5 times per session. Do 2 sessions per day.  Feet Apart, Head Motion - Eyes Open   Stand with both hands on stable surface. With eyes open, feet apart, move head slowly: side to side 10 times; up and down 3 times.  Do __2_ sessions per day.         Feet Apart, Varied Arm Positions - Eyes Closed - Arms by your side    Stand with back to corner with stable chair in front of you and with feet shoulder width apart and arms by your side. Close eyes and visualize upright position. Hold _20__ seconds. Repeat __3__ times per session. Do __2__ sessions per day.

## 2015-02-02 NOTE — Therapy (Signed)
[facility] [address] [place], [place], [address] Phone: [phone]   Fax:  [phone]  Physical Therapy Treatment  Patient Details  Name: Crystal Park MRN: [id number] Date of Birth: [birth year] Referring Provider: Arlice Colt, MD  Encounter Date: 02/02/2015      PT End of Session - 02/02/15 1129    Visit Number 6   Number of Visits 17   Date for PT Re-Evaluation 03/08/15   Authorization Type [employer] - 20 visit limit for PT   Authorization - Visit Number 6   Authorization - Number of Visits 20   PT Start Time 0848   PT Stop Time 0930   PT Time Calculation (min) 42 min   Activity Tolerance Patient tolerated treatment well   Behavior During Therapy [facility] for tasks assessed/performed      Past Medical History  Diagnosis Date  . Benign paroxysmal positional vertigo 10/12/2014    No past surgical history on file.  There were no vitals filed for this visit.  Visit Diagnosis:  Dizziness and giddiness  Abnormality of gait  Unsteadiness      Subjective Assessment - 02/02/15 0850    Subjective "The dizziness is okay. The exercises that are the hardest are the ones when I'm laying down and getting up. Getting up out of bed isn't as bad. 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 No/denies                         [facility] Adult PT Treatment/Exercise - 02/02/15 0001    Ambulation/Gait   Ambulation/Gait Yes    Ambulation/Gait Assistance 6: Modified independent (Device/Increase time);5: Supervision   Ambulation/Gait Assistance Details x414' over level, indoor surfaces with mod I using SPC. During indoor gait, noted pt consistently utilizing compensatory strategies provided during previous sessions. Gait x500' over unlevel, paved surfaces ourdoors with SPC and (S) with no overt LOB.   Assistive device Straight cane   Gait Pattern Step-through pattern;Decreased arm swing - left;Decreased stride length;Right foot flat;Left foot flat  BOS more normalized as compared with previous sessions   Ambulation Surface Level;Indoor   Gait velocity 2.67 ft/sec   Stairs Yes   Stairs Assistance 6: Modified independent (Device/Increase time);4: Min guard   Stairs Assistance Details (indicate cue type and reason) mod I for initial 12 steps; min guard for final 4 steps due to single toe catch while ascending 4 steps.   Stair Management Technique One rail Right;One rail Left;Alternating pattern;Forwards   Number of Stairs 16   Height of Stairs 6             Balance Exercises - 02/02/15 1127    Balance Exercises: Standing   Standing Eyes Opened Foam/compliant surface;30 secs;2 reps  close (S) due to multidirectional sway   Standing Eyes Closed Wide (BOA);Foam/compliant surface;Solid surface;10 secs;20 secs;3 reps  EC, solid surface x20 sec; EC on pillow x10 sec  PT Education - 02/02/15 1124    Education provided Yes   Education Details HEP: removed convergence and compensatory strategy with turning; added corner balance exercise.   Person(s) Educated Patient   Methods Explanation;Demonstration;Verbal cues;Handout   Comprehension Verbalized understanding;Returned demonstration          PT Short Term Goals - 02/02/15 0854    PT SHORT TERM GOAL #1   Title Pt will perform initial HEP with mod I using paper handout to maximize progress made in PT. Target date: 02/04/15   Baseline Met 11/30.    Status Achieved   PT SHORT TERM GOAL #2   Title During gait x150', pt will utilize compensatory strategies for dizziness/disequilibrium without cueing to increase pt safety with ambulation. Target date: 02/04/15   Baseline Met 11/30.   Status Achieved   PT SHORT TERM GOAL #3   Title Pt will ambulate x200' over level, indoor surfaces with effective obstacle negotiation with mod I using LRAD to indicate safety with household mobility. Target date: 02/04/15   Baseline Met 11/30.   Status Achieved   PT SHORT TERM GOAL #4   Title Pt will improve gait speed from 2.34 ft/sec to > 2.62 ft/sec to indicate status of community ambulatory. Target date: 02/04/15   Baseline Met 11/30. Gait velocity = 2.67 ft/sec   Status Achieved   PT SHORT TERM GOAL #5   Title Pt will negotiate 16 stairs with single rail with with no more than 2-point increase in subjective dizziness to increase pt tolerance to stair negotiation in home. Target date: 02/06/15   Baseline 11/30: mod I for initial 12 stairs; pt required min guard due to single toe catch on final 4 stairs due to increased dizziness (from baseline 3/10 to > 5/10).   Status Partially Met           PT Long Term Goals - 01/07/15 1124    PT LONG TERM GOAL #1   Title Pt will perform dynamic standing task for 10 consecutive minutes with no more than 3-point increase in subjective dizziness to indicate pt ability to unload dishwasher. Target date: 03/04/15   PT LONG TERM GOAL #2   Title Pt will decrease DHI score from 84 to </= 61 to indicate decreased pt-perceived disability due to dizziness.  Target date: 03/04/15   PT LONG TERM GOAL #3   Title Pt will independently negotiate 16 stairs with single rail using reciprocal pattern to indicate increased stability/independence accessing second floor of home. Target date: 03/04/15   PT LONG TERM GOAL #4   Title Pt will ambulate 500' over unlevel, paved surfaces with mod I using LRAD to indicate increased safety with  community mobility. Target date: 03/04/15   PT LONG TERM GOAL #5   Title Pt will report ability to her walk dog without loss of balance to indicate safe return to leisure activities. Target date: 03/04/15               Plan - 02/02/15 1132    Clinical Impression Statement Session focused on assessing STG's and functional progress thusfar. Pt met 3 of 4 STG's, suggesting HEP compliance, improved gait stability in home environment, and increased efficiency of ambulation. STG 4 for negotiation of 16 stairs was partially met, as pt exibited single toe catch (able to self-recover from LOB) and reported 3-point increase in subjective dizziness. Since beginning PT, pt perceives decreased motion sensitivity with bed mobility and riding in car.   Pt will benefit from skilled  therapeutic intervention in order to improve on the following deficits Dizziness;Decreased balance;Decreased activity tolerance;Abnormal gait;Decreased coordination;Impaired vision/preception   Rehab Potential Good   PT Frequency 2x / week   PT Duration 8 weeks   PT Treatment/Interventions ADLs/Self Care Home Management;Gait training;Stair training;Functional mobility training;Therapeutic activities;Vestibular;Visual/perceptual remediation/compensation;Neuromuscular re-education;Patient/family education;Therapeutic exercise;Balance training   PT Next Visit Plan Dynamic standing balance activities (compliant surfaces), dynamic gait.   Consulted and Agree with Plan of Care Patient        Problem List Patient Active Problem List   Diagnosis Date Noted  . Multiple sclerosis ([place]) 10/26/2014  . Benign paroxysmal positional vertigo 10/12/2014  . 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 Urinary frequency 10/12/2014  . Other fatigue 10/12/2014  . Numbness 10/12/2014    Billie Ruddy, PT, DPT [facility] [address] [place], [place], [address] Phone: [phone]   Fax:   [phone] 02/02/2015, 11:39 AM   Name: Crystal Park MRN: [id number] Date of Birth: 1960-12-18

## 2015-02-04 ENCOUNTER — Ambulatory Visit: Payer: Managed Care, Other (non HMO) | Attending: Neurology | Admitting: Physical Therapy

## 2015-02-04 DIAGNOSIS — R2681 Unsteadiness on feet: Secondary | ICD-10-CM

## 2015-02-04 DIAGNOSIS — R42 Dizziness and giddiness: Secondary | ICD-10-CM | POA: Insufficient documentation

## 2015-02-04 DIAGNOSIS — R269 Unspecified abnormalities of gait and mobility: Secondary | ICD-10-CM | POA: Diagnosis present

## 2015-02-04 NOTE — Patient Instructions (Addendum)
Gaze Stabilization: Tip Card 1.Target must remain in focus, not blurry, and appear stationary while head is in motion. 2.Perform exercises with small head movements (45 to either side of midline). 3.Increase speed of head motion so long as target is in focus. 4.If you wear eyeglasses, be sure you can see target through lens (therapist will give specific instructions for bifocal / progressive lenses). 5.These exercises may provoke dizziness or nausea. Work through these symptoms. If too dizzy, slow head movement slightly. Rest between each exercise. 6.Exercises demand concentration; avoid distractions. 7.For safety, perform standing exercises close to a counter, wall, corner, or next to someone.   Gaze Stabilization: Standing Feet Apart    Stand with stable chair in front of you (just in case). Feet shoulder width apart, keeping eyes on target on wall __6__ feet away, tilt head down 15-30 and move head side to side for __20__ seconds. Repeat while moving head up and down for __20__ seconds. Do _2___ sessions per day.  Habituation Tip Card 1.The goal of habituation training is to assist in decreasing symptoms of vertigo, dizziness, or nausea provoked by specific head and body motions. 2.These exercises may initially increase symptoms; however, be persistent and work through symptoms. With repetition and time, the exercises will assist in reducing or eliminating symptoms. 3.Exercises should be stopped and discussed with the therapist if you experience any of the following: - Sudden change or fluctuation in hearing - New onset of ringing in the ears, or increase in current intensity - Any fluid discharge from the ear - Severe pain in neck or back - Extreme nausea - - - Dizziness rating increases by 2 or more points from baseline if you start at baseline 4 or more. - Dizziness rating increases by 3 or more points from baseline if you start baseline less than 4.  Rolling   With pillow under  head, start on back. Roll to your right side. Hold until dizziness stops, plus 20 seconds and then roll to the left side. Hold until dizziness stops, plus 20 seconds. Repeat sequence 5 times per session. Do 2 sessions per day.  Sit to Side-Lying   Sit on edge of bed. Lie down onto the right side and hold until dizziness stops, plus 20 seconds. Return to sitting and wait until dizziness stops, plus 20 seconds. Repeat to the left side. Repeat sequence 5 times per session. Do 2 sessions per day.  Feet Apart, Head Motion - Eyes Open   Stand with both hands on stable surface. With eyes open, feet apart, move head slowly up and down 10 times.  Do __2_ sessions per day.         Feet Apart, Varied Arm Positions - Eyes Closed - Arms by your side    Stand with back to corner with stable chair in front of you and with feet shoulder width apart and arms by your side. Close eyes and visualize upright position. Hold _20__ seconds. Repeat __3__ times per session. Do __2__ sessions per day.

## 2015-02-05 NOTE — Therapy (Signed)
[facility] [address] [place], [place], [address] Phone: [phone]   Fax:  [phone]  Physical Therapy Treatment  Patient Details  Name: Crystal Park MRN: [id number] Date of Birth: August 15, 1960 Referring Provider: Arlice Colt, MD  Encounter Date: 02/04/2015      PT End of Session - 02/05/15 2103    Visit Number 7   Number of Visits 17   Date for PT Re-Evaluation 03/08/15   Authorization - Visit Number 7   Authorization - Number of Visits 20   PT Start Time 0849   PT Stop Time 0930   PT Time Calculation (min) 41 min   Equipment Utilized During Treatment Gait belt   Activity Tolerance Patient tolerated treatment well   Behavior During Therapy [facility] for tasks assessed/performed      Past Medical History  Diagnosis Date  . Benign paroxysmal positional vertigo 10/12/2014    No past surgical history on file.  There were no vitals filed for this visit.  Visit Diagnosis:  Dizziness and giddiness  Abnormality of gait  Unsteadiness                       [facility] Adult PT Treatment/Exercise - 02/05/15 0001    Ambulation/Gait   Ambulation/Gait Yes   Ambulation/Gait Assistance 6: Modified independent (Device/Increase time)   Ambulation Distance (Feet) 500 Feet   Assistive device Straight cane   Gait Pattern Step-through pattern;Decreased arm swing - left;Decreased stride length;Right foot flat;Left foot flat  BOS more normalized as compared with previous sessions   Ambulation Surface Level;Unlevel;Indoor;Outdoor;Paved             Balance Exercises - 02/05/15 2059    Balance Exercises: Standing   Standing Eyes Opened Foam/compliant surface;30 secs;1 rep;Wide (BOA);Head turns  Airex foam with horizontal head turns x10   Standing Eyes Closed Wide (BOA);Foam/compliant surface;Solid surface;2 reps;30 secs  EC, solid surface x20 sec; EC on pillow x10 sec   Rockerboard  Anterior/posterior;EO;Head turns;5 reps  large board; horiz., vert. head turns x10 each           PT Education - 02/05/15 2056    Education provided Yes   Education Details HEP progressed; see Pt Instructions/   Person(s) Educated Patient   Methods Explanation;Demonstration;Handout   Comprehension Verbalized understanding;Returned demonstration          PT Short Term Goals - 02/02/15 0854    PT SHORT TERM GOAL #1   Title Pt will perform initial HEP with mod I using paper handout to maximize progress made in PT. Target date: 02/04/15   Baseline Met 11/30.   Status Achieved   PT SHORT TERM GOAL #2   Title During gait x150', pt will utilize compensatory strategies for dizziness/disequilibrium without cueing to increase pt safety with ambulation. Target date: 02/04/15   Baseline Met 11/30.   Status Achieved   PT SHORT TERM GOAL #3   Title Pt will ambulate x200' over level, indoor surfaces with effective obstacle negotiation with mod I using LRAD to indicate safety with household mobility. Target date: 02/04/15   Baseline Met 11/30.   Status Achieved   PT SHORT TERM GOAL #4   Title Pt will improve gait speed from 2.34 ft/sec to > 2.62 ft/sec to indicate status of community ambulatory. Target date: 02/04/15   Baseline Met 11/30. Gait velocity = 2.67 ft/sec   Status Achieved   PT SHORT TERM GOAL #5   Title Pt will  negotiate 16 stairs with single rail with with no more than 2-point increase in subjective dizziness to increase pt tolerance to stair negotiation in home. Target date: 02/06/15   Baseline 11/30: mod I for initial 12 stairs; pt required min guard due to single toe catch on final 4 stairs due to increased dizziness (from baseline 3/10 to > 5/10).   Status Partially Met           PT Long Term Goals - 01/07/15 1124    PT LONG TERM GOAL #1   Title Pt will perform dynamic standing task for 10 consecutive minutes with no more than 3-point increase in subjective dizziness to  indicate pt ability to unload dishwasher. Target date: 03/04/15   PT LONG TERM GOAL #2   Title Pt will decrease DHI score from 84 to </= 61 to indicate decreased pt-perceived disability due to dizziness.  Target date: 03/04/15   PT LONG TERM GOAL #3   Title Pt will independently negotiate 16 stairs with single rail using reciprocal pattern to indicate increased stability/independence accessing second floor of home. Target date: 03/04/15   PT LONG TERM GOAL #4   Title Pt will ambulate 500' over unlevel, paved surfaces with mod I using LRAD to indicate increased safety with community mobility. Target date: 03/04/15   PT LONG TERM GOAL #5   Title Pt will report ability to her walk dog without loss of balance to indicate safe return to leisure activities. Target date: 03/04/15               Plan - 02/05/15 2103    Clinical Impression Statement Skilled session focused on dynamic standing balance with/without functional head turns and with decreased somatosensory input. Pt exhibits improved tolerance to head turns and VOR, as exhibited by ability to perform VOR X! viewing in standing x20 reps as compared with tolerating 10-20 reps in seated during previous sessions. Pt did require prolonged seated rest break due to increased disequilibrium after head turns x10 on Airex foam.   Pt will benefit from skilled therapeutic intervention in order to improve on the following deficits Dizziness;Decreased balance;Decreased activity tolerance;Abnormal gait;Decreased coordination;Impaired vision/preception   Rehab Potential Good   PT Frequency 2x / week   PT Duration 8 weeks   PT Treatment/Interventions ADLs/Self Care Home Management;Gait training;Stair training;Functional mobility training;Therapeutic activities;Vestibular;Visual/perceptual remediation/compensation;Neuromuscular re-education;Patient/family education;Therapeutic exercise;Balance training   PT Next Visit Plan Continue dynamic standing balance  activities (compliant surfaces), dynamic gait.   Consulted and Agree with Plan of Care Patient        Problem List Patient Active Problem List   Diagnosis Date Noted  . Multiple sclerosis ([place]) 10/26/2014  . Benign paroxysmal positional vertigo 10/12/2014  . 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 Urinary frequency 10/12/2014  . Other fatigue 10/12/2014  . Numbness 10/12/2014   Billie Ruddy, PT, DPT [facility] [address] [place], [place], [address] Phone: [phone]   Fax:  [phone] 02/05/2015, 9:07 PM   Name: Crystal Park MRN: [id number] Date of Birth: 11-11-1960

## 2015-02-09 ENCOUNTER — Ambulatory Visit: Payer: Managed Care, Other (non HMO) | Admitting: Physical Therapy

## 2015-02-09 DIAGNOSIS — R42 Dizziness and giddiness: Secondary | ICD-10-CM

## 2015-02-09 DIAGNOSIS — R2681 Unsteadiness on feet: Secondary | ICD-10-CM

## 2015-02-09 DIAGNOSIS — R269 Unspecified abnormalities of gait and mobility: Secondary | ICD-10-CM

## 2015-02-09 NOTE — Therapy (Signed)
[facility] [address] [place], [place], [address] Phone: [phone]   Fax:  [phone]  Physical Therapy Treatment  Patient Details  Name: Crystal Park MRN: [id number] Date of Birth: 17-Jul-1960 Referring Provider: Arlice Colt, MD  Encounter Date: 02/09/2015      PT End of Session - 02/09/15 1018    Visit Number 8   Number of Visits 17   Date for PT Re-Evaluation 03/08/15   Authorization Type [employer] - 20 visit limit for PT   Authorization - Visit Number 8   Authorization - Number of Visits 20   PT Start Time 0801   PT Stop Time 0848   PT Time Calculation (min) 47 min   Equipment Utilized During Treatment Gait belt   Activity Tolerance Patient tolerated treatment well   Behavior During Therapy [facility] for tasks assessed/performed      Past Medical History  Diagnosis Date  . Benign paroxysmal positional vertigo 10/12/2014    No past surgical history on file.  There were no vitals filed for this visit.  Visit Diagnosis:  Dizziness and giddiness  Abnormality of gait  Unsteadiness      Subjective Assessment - 02/09/15 0806    Subjective Pt reports having "off days" in which dizziness is worse. Monday was a "really off" day but yesterday was better.   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 No/denies                Vestibular Assessment - 02/09/15 0001    Positional Sensitivities   Sit to Supine Mild dizziness   Supine to Left Side Severe dizziness  returned to baseline within seconds   Supine to Right Side Moderate  dizziness  returned to baseline within seconds                 [facility] Adult PT Treatment/Exercise - 02/09/15 0001    Ambulation/Gait   Ambulation/Gait Yes   Ambulation/Gait Assistance 5: Supervision;4: Min assist   Ambulation/Gait Assistance Details 115' x3 trials indoors without AD requiring supervision to min A to recover from LOB due to L toe catch. x200' outdoors with forearm crutch  with supervision.   Ambulation Distance (Feet) 545 Feet  115' x3 indoors; x200 outdoors   Assistive device None;R Forearm Crutch   Gait Pattern Step-through pattern;Decreased arm swing - left;Decreased stride length;Right foot flat;Left foot flat  BOS more normalized as compared with previous sessions   Ambulation Surface Level;Unlevel;Indoor;Outdoor;Paved   Curb 5: Supervision   Curb Details (indicate cue type and reason) with R forearm crutch         Vestibular Treatment/Exercise - 02/09/15 0001    Vestibular Treatment/Exercise   Habituation Exercises Legrand Como Daroff   Number of Reps  3   Symptom Description  To L side: 4/10 disequilirbrium; decreased to baseline within seconds. To R side: 3/5 initially, return to 2/5 baseline within seoonds   Standing Horizontal Head Turns   Number of Reps  10   Symptom Description  4/10   Standing Vertical Head Turns   Number of  Reps  5   Symptom Description  5/10  for seconds; decreased to baseline within 60 seconds              PT Short Term Goals - 02/02/15 0854    PT SHORT TERM GOAL #1   Title Pt will perform initial HEP with mod I using paper handout to maximize progress made in PT. Target date: 02/04/15   Baseline Met 11/30.   Status Achieved   PT SHORT TERM GOAL #2   Title During gait x150', pt will utilize compensatory strategies for dizziness/disequilibrium without cueing to increase pt safety with ambulation. Target date: 02/04/15   Baseline Met 11/30.   Status Achieved   PT SHORT TERM GOAL #3   Title Pt will  ambulate x200' over level, indoor surfaces with effective obstacle negotiation with mod I using LRAD to indicate safety with household mobility. Target date: 02/04/15   Baseline Met 11/30.   Status Achieved   PT SHORT TERM GOAL #4   Title Pt will improve gait speed from 2.34 ft/sec to > 2.62 ft/sec to indicate status of community ambulatory. Target date: 02/04/15   Baseline Met 11/30. Gait velocity = 2.67 ft/sec   Status Achieved   PT SHORT TERM GOAL #5   Title Pt will negotiate 16 stairs with single rail with with no more than 2-point increase in subjective dizziness to increase pt tolerance to stair negotiation in home. Target date: 02/06/15   Baseline 11/30: mod I for initial 12 stairs; pt required min guard due to single toe catch on final 4 stairs due to increased dizziness (from baseline 3/10 to > 5/10).   Status Partially Met           PT Long Term Goals - 01/07/15 1124    PT LONG TERM GOAL #1   Title Pt will perform dynamic standing task for 10 consecutive minutes with no more than 3-point increase in subjective dizziness to indicate pt ability to unload dishwasher. Target date: 03/04/15   PT LONG TERM GOAL #2   Title Pt will decrease DHI score from 84 to </= 61 to indicate decreased pt-perceived disability due to dizziness.  Target date: 03/04/15   PT LONG TERM GOAL #3   Title Pt will independently negotiate 16 stairs with single rail using reciprocal pattern to indicate increased stability/independence accessing second floor of home. Target date: 03/04/15   PT LONG TERM GOAL #4   Title Pt will ambulate 500' over unlevel, paved surfaces with mod I using LRAD to indicate increased safety with community mobility. Target date: 03/04/15   PT LONG TERM GOAL #5   Title Pt will report ability to her walk dog without loss of balance to indicate safe return to leisure activities. Target date: 03/04/15               Plan - 02/09/15 0808    Clinical Impression Statement Pt continues  to exhibit improved tolerance to functional head turns, bed mobility, and activity in general. Current session was the first in which pt did not request/require any seated rest breaks.   Pt will benefit from skilled therapeutic intervention in order to improve on the following deficits Dizziness;Decreased balance;Decreased activity tolerance;Abnormal gait;Decreased coordination;Impaired vision/preception   Rehab Potential Good   PT Frequency 2x / week   PT Duration 8 weeks   PT Treatment/Interventions ADLs/Self Care Home Management;Gait training;Stair training;Functional mobility training;Therapeutic activities;Vestibular;Visual/perceptual remediation/compensation;Neuromuscular re-education;Patient/family education;Therapeutic exercise;Balance training   PT Next Visit Plan Consider adding semi  tandem, standing with narrow BOS to HEP. Continue dynamic standing balance activities (compliant surfaces), dynamic gait.    Consulted and Agree with Plan of Care Patient   Family Member Consulted husband, [name]        Problem List Patient Active Problem List   Diagnosis Date Noted  . Multiple sclerosis ([place]) 10/26/2014  . Benign paroxysmal positional vertigo 10/12/2014  . 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 Urinary frequency 10/12/2014  . Other fatigue 10/12/2014  . Numbness 10/12/2014    Billie Ruddy, PT, DPT [facility] [address] [place], [place], [address] Phone: [phone]   Fax:  [phone] 02/09/2015, 2:10 PM  Name: Crystal Park MRN: [id number] Date of Birth: 1960/05/16

## 2015-02-11 ENCOUNTER — Ambulatory Visit: Payer: Managed Care, Other (non HMO) | Admitting: Physical Therapy

## 2015-02-11 DIAGNOSIS — R42 Dizziness and giddiness: Secondary | ICD-10-CM | POA: Diagnosis not present

## 2015-02-11 DIAGNOSIS — R2681 Unsteadiness on feet: Secondary | ICD-10-CM

## 2015-02-11 DIAGNOSIS — R269 Unspecified abnormalities of gait and mobility: Secondary | ICD-10-CM

## 2015-02-11 NOTE — Patient Instructions (Signed)
Gaze Stabilization: Tip Card 1.Target must remain in focus, not blurry, and appear stationary while head is in motion. 2.Perform exercises with small head movements (45 to either side of midline). 3.Increase speed of head motion so long as target is in focus. 4.If you wear eyeglasses, be sure you can see target through lens (therapist will give specific instructions for bifocal / progressive lenses). 5.These exercises may provoke dizziness or nausea. Work through these symptoms. If too dizzy, slow head movement slightly. Rest between each exercise. 6.Exercises demand concentration; avoid distractions. 7.For safety, perform standing exercises close to a counter, wall, corner, or next to someone.   Gaze Stabilization: Standing Feet Apart    Stand with stable chair in front of you (just in case). Feet shoulder width apart, keeping eyes on target on wall __6__ feet away, tilt head down 15-30 and move head side to side for __20__ seconds. Repeat while moving head up and down for __20__ seconds. Do _2___ sessions per day.  Habituation Tip Card 1.The goal of habituation training is to assist in decreasing symptoms of vertigo, dizziness, or nausea provoked by specific head and body motions. 2.These exercises may initially increase symptoms; however, be persistent and work through symptoms. With repetition and time, the exercises will assist in reducing or eliminating symptoms. 3.Exercises should be stopped and discussed with the therapist if you experience any of the following: - Sudden change or fluctuation in hearing - New onset of ringing in the ears, or increase in current intensity - Any fluid discharge from the ear - Severe pain in neck or back - Extreme nausea - - - Dizziness rating increases by 2 or more points from baseline if you start at baseline 4 or more. - Dizziness rating increases by 3 or more points from baseline if you start baseline less than 4.  Rolling   With pillow under  head, start on back. Roll to your right side. Hold until dizziness stops, plus 20 seconds and then roll to the left side. Hold until dizziness stops, plus 20 seconds. Repeat sequence 5 times per session. Do 2 sessions per day.  Sit to Side-Lying   Sit on edge of bed. Lie down onto the right side and hold until dizziness stops, plus 20 seconds. Return to sitting and wait until dizziness stops, plus 20 seconds. Repeat to the left side. Repeat sequence 5 times per session. Do 2 sessions per day.  Feet Apart, Head Motion - Eyes Open   Stand with both hands on stable surface. With eyes open, feet apart, move head slowly up and down 10 times.  Do __2_ sessions per day.         Feet Apart, Varied Arm Positions - Eyes Closed - Arms by your side    Stand with back to corner with stable chair in front of you and with feet shoulder width apart and arms by your side. Close eyes and visualize upright position. Hold _30__ seconds. Repeat __2__ times per session. Do __2__ sessions per day.     Feet Together, Varied Arm Positions - Eyes Open - Arms by your side    Stand with back to corner with stable chair in front of you and with feet shoulder width apart and arms by your side. Look straight ahead at a stationary object. Hold __30__ seconds. Repeat __2__ times per session. Do __2__ sessions per day.   Feet Heel-Toe "Tandem", Varied Arm Positions - Eyes Open - Arms by your side    Stand  with back to corner with stable chair in front of you and arms by your side. Try not to hold onto chair as you place the toes of your LEFT foot next to the heel of your RIGHT foot. Look straight ahead at a stationary object. Hold __30__ seconds. Switch foot position. Perform exercise one time with each foot in front. Do __2__ sessions per day.

## 2015-02-12 NOTE — Therapy (Signed)
[facility] [address] [place], [place], [address] Phone: [phone]   Fax:  [phone]  Physical Therapy Treatment  Patient Details  Name: Crystal Park MRN: [id number] Date of Birth: [birth year] Referring Provider: Arlice Colt, MD  Encounter Date: 02/11/2015      PT End of Session - 02/12/15 1433    Visit Number 9   Number of Visits 17   Date for PT Re-Evaluation 03/08/15   Authorization Type [employer] - 20 visit limit for PT   Authorization - Visit Number 9   Authorization - Number of Visits 20   PT Start Time 7782   PT Stop Time 0934   PT Time Calculation (min) 39 min   Equipment Utilized During Treatment Gait belt   Activity Tolerance Patient tolerated treatment well   Behavior During Therapy [facility] for tasks assessed/performed      Past Medical History  Diagnosis Date  . Benign paroxysmal positional vertigo 10/12/2014    No past surgical history on file.  There were no vitals filed for this visit.  Visit Diagnosis:  Abnormality of gait  Dizziness and giddiness  Unsteadiness      Subjective Assessment - 02/11/15 0854    Subjective Pt reports having felt "tired but great" after last PT session. Denies falls and reports no significant changes since last session.   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 No/denies                         [facility] Adult PT Treatment/Exercise - 02/12/15 0001    Ambulation/Gait   Ambulation/Gait Yes   Ambulation/Gait Assistance 5: Supervision   Ambulation/Gait Assistance  Details Gait x200' outdoors without brace, x200' indoors without brace, then x200' indoors with L FootUp brace.   Ambulation Distance (Feet) 600 Feet   Assistive device None;Other (Comment)  L FootUp brace   Gait Pattern Step-through pattern;Decreased arm swing - left;Decreased stride length;Left foot flat;Decreased dorsiflexion - left;Poor foot clearance - left;Narrow base of support;Left circumduction  BOS more normalized as compared with previous sessions   Ambulation Surface Level;Unlevel;Indoor;Outdoor;Paved   Gait Comments During gait training, noted decreased L ankle DF during LLE advancement, L forefoot strike, and L circumduction (most prominent when ambulating outdoors). Therefore, trialed L FootUp brace with noted improvement in L circumduction , L anke DF.         Vestibular Treatment/Exercise - 02/12/15 0001    Vestibular Treatment/Exercise   Vestibular Treatment Provided Habituation;Gaze   Habituation Exercises Standing Vertical Head Turns;Standing Diagonal Head Turns   Gaze Exercises X1 Viewing Horizontal;X1 Viewing Vertical   Standing Horizontal Head Turns   Number of Reps  10   Symptom Description  4/10  decreased to baseline 3/10 within seconds   Standing Vertical Head Turns   Number of Reps  5  then 5 additional reps 15 seconds later   Symptom Description  4/10  decreased to baseline 3/10 in 15 seconds   X1 Viewing Horizontal   Foot Position standing without UE support   Reps 20   Comments 4/10  X1 Viewing Vertical   Foot Position standing   Reps 20   Comments 5/10  subjective dizziness decreased to baseline after gait x115'            Balance Exercises - 02/12/15 1426    Balance Exercises: Standing   Standing Eyes Opened Narrow base of support (BOS);Solid surface;30 secs;4 reps   Standing Eyes Closed Wide (BOA);Foam/compliant surface;4 reps;30 secs  pillow x1; not yet progressed on HEP, as pt uncomfortable   Tandem Stance Eyes open;4 reps;30 secs            PT Education - 02/12/15 1425    Education provided Yes   Education Details HEP: added static standing balance activities. Discussed possible use of L FootUp brace for more consistent LLE clearance.    Person(s) Educated Patient   Methods Explanation;Demonstration;Verbal cues;Handout   Comprehension Verbalized understanding;Returned demonstration          PT Short Term Goals - 02/02/15 0854    PT SHORT TERM GOAL #1   Title Pt will perform initial HEP with mod I using paper handout to maximize progress made in PT. Target date: 02/04/15   Baseline Met 11/30.   Status Achieved   PT SHORT TERM GOAL #2   Title During gait x150', pt will utilize compensatory strategies for dizziness/disequilibrium without cueing to increase pt safety with ambulation. Target date: 02/04/15   Baseline Met 11/30.   Status Achieved   PT SHORT TERM GOAL #3   Title Pt will ambulate x200' over level, indoor surfaces with effective obstacle negotiation with mod I using LRAD to indicate safety with household mobility. Target date: 02/04/15   Baseline Met 11/30.   Status Achieved   PT SHORT TERM GOAL #4   Title Pt will improve gait speed from 2.34 ft/sec to > 2.62 ft/sec to indicate status of community ambulatory. Target date: 02/04/15   Baseline Met 11/30. Gait velocity = 2.67 ft/sec   Status Achieved   PT SHORT TERM GOAL #5   Title Pt will negotiate 16 stairs with single rail with with no more than 2-point increase in subjective dizziness to increase pt tolerance to stair negotiation in home. Target date: 02/06/15   Baseline 11/30: mod I for initial 12 stairs; pt required min guard due to single toe catch on final 4 stairs due to increased dizziness (from baseline 3/10 to > 5/10).   Status Partially Met           PT Long Term Goals - 01/07/15 1124    PT LONG TERM GOAL #1   Title Pt will perform dynamic standing task for 10 consecutive minutes with no more than 3-point increase in subjective  dizziness to indicate pt ability to unload dishwasher. Target date: 03/04/15   PT LONG TERM GOAL #2   Title Pt will decrease DHI score from 84 to </= 61 to indicate decreased pt-perceived disability due to dizziness.  Target date: 03/04/15   PT LONG TERM GOAL #3   Title Pt will independently negotiate 16 stairs with single rail using reciprocal pattern to indicate increased stability/independence accessing second floor of home. Target date: 03/04/15   PT LONG TERM GOAL #4   Title Pt will ambulate 500' over unlevel, paved surfaces with mod I using LRAD to indicate increased safety with community mobility. Target date: 03/04/15   PT LONG TERM GOAL #5   Title Pt will report ability to her walk dog without loss of balance to indicate safe return to leisure activities.   Target date: 03/04/15               Plan - 02/12/15 1434    Clinical Impression Statement Session focused on standing balance and gait training. Noted decreased L ankle dorsiflexion and increased LLE circumduction during gait training today. Therefore, trialed L FootUp brace with noted improvement in gait stability. Pt was encouraged by improvement and is considering purchasing brace.    Pt will benefit from skilled therapeutic intervention in order to improve on the following deficits Dizziness;Decreased balance;Decreased activity tolerance;Abnormal gait;Decreased coordination;Impaired vision/preception   Rehab Potential Good   PT Frequency 2x / week   PT Duration 8 weeks   PT Treatment/Interventions ADLs/Self Care Home Management;Gait training;Stair training;Functional mobility training;Therapeutic activities;Vestibular;Visual/perceptual remediation/compensation;Neuromuscular re-education;Patient/family education;Therapeutic exercise;Balance training   PT Next Visit Plan Ask about L Foot Up brace. Assess for LE spasticity (focus on L), impaired selective control (PNF) as origin of LLE circumduction during gait.   Consulted and  Agree with Plan of Care Patient        Problem List Patient Active Problem List   Diagnosis Date Noted  . Multiple sclerosis ([facility]) 10/26/2014  . Benign paroxysmal positional vertigo 10/12/2014  . 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 Urinary frequency 10/12/2014  . Other fatigue 10/12/2014  . Numbness 10/12/2014    Blair Hobble, PT, DPT [facility] [address] [place], [place], [address] Phone: [phone]   Fax:  [phone] 02/12/2015, 2:43 PM   Name: Crystal Park MRN: [id number] Date of Birth: 01/08/1961     

## 2015-02-16 ENCOUNTER — Ambulatory Visit: Payer: Managed Care, Other (non HMO) | Admitting: Physical Therapy

## 2015-02-16 DIAGNOSIS — R269 Unspecified abnormalities of gait and mobility: Secondary | ICD-10-CM

## 2015-02-16 DIAGNOSIS — R42 Dizziness and giddiness: Secondary | ICD-10-CM

## 2015-02-16 DIAGNOSIS — R2681 Unsteadiness on feet: Secondary | ICD-10-CM

## 2015-02-16 NOTE — Therapy (Signed)
[facility] [address] [place], [place], [address] Phone: [phone]   Fax:  [phone]  Physical Therapy Treatment  Patient Details  Name: Crystal Park MRN: [id number] Date of Birth: Apr 19, 1960 Referring Provider: Arlice Colt, MD  Encounter Date: 02/16/2015      PT End of Session - 02/16/15 1342    Visit Number 10   Number of Visits 17   Date for PT Re-Evaluation 03/08/15   Authorization Type [employer] - 20 visit limit for PT   Authorization - Visit Number 10   Authorization - Number of Visits 20   PT Start Time 0801   PT Stop Time 0846   PT Time Calculation (min) 45 min   Equipment Utilized During Treatment Gait belt   Activity Tolerance Patient tolerated treatment well   Behavior During Therapy [facility] for tasks assessed/performed      Past Medical History  Diagnosis Date  . Benign paroxysmal positional vertigo 10/12/2014    No past surgical history on file.  There were no vitals filed for this visit.  Visit Diagnosis:  Abnormality of gait  Dizziness and giddiness  Unsteadiness      Subjective Assessment - 02/16/15 0805    Subjective Pt's husband ordered L FootUp brace off [place]. Baseline dizziness: 3-4/10.   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 No/denies            [facility] PT Assessment - 02/16/15 0001    Tone   Assessment Location Left Lower Extremity   ROM / Strength   AROM / PROM / Strength Strength   Strength   Overall Strength Deficits   Overall Strength Comments WFL with exception of L hip ABD 4/5; hip  extension (gluteus maximus isolated) 3+/5 on L, 4-/5 on R.   LLE Tone   LLE Tone Other (comment)  clonus x4 beats in soleus, x2 beats in gastroc            Vestibular Assessment - 02/16/15 0001    Positional Sensitivities   Sit to Supine Lightheadedness   Supine to Left Side Moderate dizziness   Supine to Right Side Lightheadedness   Supine to Sitting Mild dizziness   Positional Sensitivities Comments Initially required approx. 1 minute for dizziness to return to baseline after supine > L side lying.                 [facility] Adult PT Treatment/Exercise - 02/16/15 0001    Ambulation/Gait   Ambulation/Gait Yes   Ambulation/Gait Assistance 5: Supervision   Ambulation/Gait Assistance Details Cueing focused on L hip/knee flexion during LLE advancement (as opposed to LLE circumduction) and L heel strike. Tactile cueing at L iliac crest provided to promote L pelvic protraction, anterior weight shift (emphasis during RLE advancement).    Ambulation Distance (Feet) 425 Feet   Assistive device Straight cane   Gait Pattern Step-through pattern;Decreased arm swing - left;Decreased stride length;Left foot flat;Decreased dorsiflexion - left;Poor foot clearance - left;Narrow base of support;Left circumduction  BOS more normalized as compared with previous sessions   Ambulation Surface Level;Indoor   Neuro  Re-ed    Neuro Re-ed Details  Supine: LLE D2 PNF D2 flexion/extension x10 reps rhythmic initiation, x10 reps resisted. Multimodal cueing focused on selective control of L ankle DF with concurrent L hip/knee flexion.   Exercises   Exercises Other Exercises   Other Exercises  Performed the following to increase activation of L hip extensors/abductors during ambulation: L single leg bridging x5 reps; modified LLE bridging (supine with LLE extended off EOM) x10 reps with RLE on rolling chair to decrease compensation.         Vestibular Treatment/Exercise - 02/16/15 0001    Vestibular  Treatment/Exercise   Vestibular Treatment Provided Habituation   Habituation Exercises Laruth Bouchard Daroff;Horizontal Roll   Nestor Lewandowsky   Number of Reps  2  per side   Symptom Description  feeling "off" for a few seconds, per pt.   Horizontal Roll   Number of Reps  3  per direction   Symptom Description  Initially, required 60 seconds for dizziness/disequilibrium to return to baseline after rolling to L side. Required < 30 seconds during final trial.            Balance Exercises - 02/16/15 1342    Balance Exercises: Standing   Tandem Stance Eyes open;30 secs;2 reps  semi tandem x2 reps per side without UE support             PT Short Term Goals - 02/02/15 0854    PT SHORT TERM GOAL #1   Title Pt will perform initial HEP with mod I using paper handout to maximize progress made in PT. Target date: 02/04/15   Baseline Met 11/30.   Status Achieved   PT SHORT TERM GOAL #2   Title During gait x150', pt will utilize compensatory strategies for dizziness/disequilibrium without cueing to increase pt safety with ambulation. Target date: 02/04/15   Baseline Met 11/30.   Status Achieved   PT SHORT TERM GOAL #3   Title Pt will ambulate x200' over level, indoor surfaces with effective obstacle negotiation with mod I using LRAD to indicate safety with household mobility. Target date: 02/04/15   Baseline Met 11/30.   Status Achieved   PT SHORT TERM GOAL #4   Title Pt will improve gait speed from 2.34 ft/sec to > 2.62 ft/sec to indicate status of community ambulatory. Target date: 02/04/15   Baseline Met 11/30. Gait velocity = 2.67 ft/sec   Status Achieved   PT SHORT TERM GOAL #5   Title Pt will negotiate 16 stairs with single rail with with no more than 2-point increase in subjective dizziness to increase pt tolerance to stair negotiation in home. Target date: 02/06/15   Baseline 11/30: mod I for initial 12 stairs; pt required min guard due to single toe catch on final 4 stairs due to  increased dizziness (from baseline 3/10 to > 5/10).   Status Partially Met           PT Long Term Goals - 01/07/15 1124    PT LONG TERM GOAL #1   Title Pt will perform dynamic standing task for 10 consecutive minutes with no more than 3-point increase in subjective dizziness to indicate pt ability to unload dishwasher. Target date: 03/04/15   PT LONG TERM GOAL #2   Title Pt will decrease DHI score from 84 to </= 61 to indicate decreased pt-perceived disability due to dizziness.  Target date: 03/04/15   PT LONG TERM GOAL #3   Title Pt will independently negotiate 16 stairs with  single rail using reciprocal pattern to indicate increased stability/independence accessing second floor of home. Target date: 03/04/15   PT LONG TERM GOAL #4   Title Pt will ambulate 500' over unlevel, paved surfaces with mod I using LRAD to indicate increased safety with community mobility. Target date: 03/04/15   PT LONG TERM GOAL #5   Title Pt will report ability to her walk dog without loss of balance to indicate safe return to leisure activities. Target date: 03/04/15               Plan - 02/16/15 1343    Clinical Impression Statement Skilled session focused on increasing selective control in LLE to promote more normalized gait pattern. Pt continues to exhibit motion sensitivity with all bed mobility; however, pt reporting progressive improvement in both severity of symptoms and time required for symptoms to dissipate.   Pt will benefit from skilled therapeutic intervention in order to improve on the following deficits Dizziness;Decreased balance;Decreased activity tolerance;Abnormal gait;Decreased coordination;Impaired vision/preception   Rehab Potential Good   PT Frequency 2x / week   PT Duration 8 weeks   PT Treatment/Interventions ADLs/Self Care Home Management;Gait training;Stair training;Functional mobility training;Therapeutic activities;Vestibular;Visual/perceptual  remediation/compensation;Neuromuscular re-education;Patient/family education;Therapeutic exercise;Balance training   PT Next Visit Plan Continue to work toward LTG's. Revisit STG for negotiation of 16 stairs.   Consulted and Agree with Plan of Care Patient        Problem List Patient Active Problem List   Diagnosis Date Noted  . Multiple sclerosis ([place]) 10/26/2014  . Benign paroxysmal positional vertigo 10/12/2014  . 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 Urinary frequency 10/12/2014  . Other fatigue 10/12/2014  . Numbness 10/12/2014    Billie Ruddy, PT, DPT [facility] [address] [place], [place], [address] Phone: [phone]   Fax:  [phone] 02/16/2015, 1:48 PM   Name: Crystal Park MRN: [id number] Date of Birth: [birth year]

## 2015-02-18 ENCOUNTER — Ambulatory Visit: Payer: Managed Care, Other (non HMO) | Admitting: Physical Therapy

## 2015-02-18 DIAGNOSIS — R269 Unspecified abnormalities of gait and mobility: Secondary | ICD-10-CM

## 2015-02-18 DIAGNOSIS — R2681 Unsteadiness on feet: Secondary | ICD-10-CM

## 2015-02-18 DIAGNOSIS — R42 Dizziness and giddiness: Secondary | ICD-10-CM

## 2015-02-18 NOTE — Patient Instructions (Addendum)
Remembered Targets    Look at target in front at __3-4__ feet away, close eyes and turn head slightly to the right imagining that you are still looking directly at the target. Open eyes and check to see if eyes are still on target. Repeat in opposite direction. Perform 10 reps, twice per day.

## 2015-02-19 NOTE — Therapy (Signed)
[facility] [address] [place], [place], [address] Phone: [phone]   Fax:  [phone]  Physical Therapy Treatment  Patient Details  Name: Crystal Park MRN: [id number] Date of Birth: [birth year] Referring Provider: Arlice Colt, MD  Encounter Date: 02/18/2015      PT End of Session - 02/19/15 1408    Visit Number 11   Number of Visits 17   Date for PT Re-Evaluation 03/08/15   Authorization Type [employer] - 20 visit limit for PT   Authorization - Visit Number 11   Authorization - Number of Visits 20   PT Start Time 0802   PT Stop Time 6767   PT Time Calculation (min) 45 min   Activity Tolerance Patient tolerated treatment well   Behavior During Therapy [facility] for tasks assessed/performed      Past Medical History  Diagnosis Date  . Benign paroxysmal positional vertigo 10/12/2014    No past surgical history on file.  There were no vitals filed for this visit.  Visit Diagnosis:  Abnormality of gait  Dizziness and giddiness  Unsteadiness      Subjective Assessment - 02/18/15 0806    Subjective Pt reports that habituation exercises are "going a lot better in the evening; they don't make me feel so bad," per pt. Baseline dizziness: 3-4/10.    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 No/denies                         [facility] Adult PT Treatment/Exercise - 02/19/15 0001    Transfers   Sit to Stand 6: Modified independent (Device/Increase time)   Sit to Stand Details (indicate cue type and reason) without AD; provided  cueing for gaze fixation during transition from sit > stand to decrease disequilibrium. When using technique, pt exhibited improved postural stability and reported decreased disequilibrium.   Ambulation/Gait   Ambulation/Gait Yes   Ambulation/Gait Assistance 5: Supervision   Ambulation/Gait Assistance Details Noted improved L hip protraction, consistent L heel strike, improved L hip/knee flexion during LLE advancement (no LL circumduction during this session), and no episodes of L toe catch. Pt consistently utilized compensatory strategy for central vestibular impairments during turning but did exhibit minor LOB (with efffective self-recovery) during 90-degree turn to L side.   Ambulation Distance (Feet) 630 Feet   Assistive device None   Gait Pattern Step-through pattern;Decreased stride length;Narrow base of support  BOS more normalized as compared with previous sessions   Ambulation Surface Level;Indoor   Stairs Yes   Stairs Assistance 6: Modified independent (Device/Increase time)  increased time required   Stair Management Technique One rail Right;Alternating pattern;Forwards   Number of Stairs 16   Height of Stairs 6             Balance Exercises - 02/19/15 1359    Balance Exercises: Standing   Standing Eyes Opened Narrow base of support (BOS);Solid surface;2 reps;30 secs  lateral trunk lean to L side   Standing Eyes Closed Foam/compliant surface;Wide (BOA);2 reps;30 secs  pillow x1   Tandem Stance Eyes  open;30 secs;Other (comment);2 reps  semi tandem           PT Education - 02/19/15 1355    Education provided Yes   Education Details HEP: added remembered targets exercise. Additional compensatory strategies for central vestibular impairments.   Person(s) Educated Patient   Methods Explanation;Demonstration;Verbal cues;Handout   Comprehension Verbalized understanding;Returned demonstration          PT Short Term Goals - 02/18/15 0830    PT SHORT TERM GOAL #1    Title Pt will perform initial HEP with mod I using paper handout to maximize progress made in PT. Target date: 02/04/15   Baseline Met 11/30.   Status Achieved   PT SHORT TERM GOAL #2   Title During gait x150', pt will utilize compensatory strategies for dizziness/disequilibrium without cueing to increase pt safety with ambulation. Target date: 02/04/15   Baseline Met 11/30.   Status Achieved   PT SHORT TERM GOAL #3   Title Pt will ambulate x200' over level, indoor surfaces with effective obstacle negotiation with mod I using LRAD to indicate safety with household mobility. Target date: 02/04/15   Baseline Met 11/30.   Status Achieved   PT SHORT TERM GOAL #4   Title Pt will improve gait speed from 2.34 ft/sec to > 2.62 ft/sec to indicate status of community ambulatory. Target date: 02/04/15   Baseline Met 11/30. Gait velocity = 2.67 ft/sec   Status Achieved   PT SHORT TERM GOAL #5   Title Pt will negotiate 16 stairs with single rail with with no more than 2-point increase in subjective dizziness to increase pt tolerance to stair negotiation in home. Target date: 02/06/15   Baseline Met 12/16.   Status Achieved           PT Long Term Goals - 02/19/15 1408    PT LONG TERM GOAL #1   Title Pt will perform dynamic standing task for 10 consecutive minutes with no more than 3-point increase in subjective dizziness to indicate pt ability to unload dishwasher. Target date: 03/04/15   PT LONG TERM GOAL #2   Title Pt will decrease DHI score from 84 to </= 61 to indicate decreased pt-perceived disability due to dizziness.  Target date: 03/04/15   PT LONG TERM GOAL #3   Title Pt will independently negotiate 16 stairs with single rail using reciprocal pattern to indicate increased stability/independence accessing second floor of home. Target date: 03/04/15   Baseline Met 12/16.   Status Achieved   PT LONG TERM GOAL #4   Title Pt will ambulate 500' over unlevel, paved surfaces with mod I using LRAD to  indicate increased safety with community mobility. Target date: 03/04/15   PT LONG TERM GOAL #5   Title Pt will report ability to her walk dog without loss of balance to indicate safe return to leisure activities. Target date: 03/04/15               Plan - 02/19/15 1410    Clinical Impression Statement Session focused on gait training, standing balance, and continued compensation and substitution for central vestibular impairments. Pt met both STG and LTG addressing negotiation of 16 stairs to access second floor of home. Initiated remembered targets exercise to facilitate central pre-programming of eye movements.   Pt will benefit from skilled therapeutic intervention in order to improve on the following deficits Dizziness;Decreased balance;Decreased activity tolerance;Abnormal gait;Decreased coordination;Impaired vision/preception   Rehab Potential Good   PT Frequency 2x / week  PT Duration 8 weeks   PT Treatment/Interventions ADLs/Self Care Home Management;Gait training;Stair training;Functional mobility training;Therapeutic activities;Vestibular;Visual/perceptual remediation/compensation;Neuromuscular re-education;Patient/family education;Therapeutic exercise;Balance training   PT Next Visit Plan Assess/address midline orientation (L lateral trunk lean/lateropulsion?) during standing balance exercises.   Consulted and Agree with Plan of Care Patient        Problem List Patient Active Problem List   Diagnosis Date Noted  . Multiple sclerosis ([place]) 10/26/2014  . Benign paroxysmal positional vertigo 10/12/2014  . 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 Urinary frequency 10/12/2014  . Other fatigue 10/12/2014  . Numbness 10/12/2014    Billie Ruddy, PT, DPT [facility] [address] [place], [place], [address] Phone: [phone]   Fax:  [phone] 02/19/2015, 2:16 PM   Name: IRINE HEMINGER MRN: [id number] Date of Birth:  Nov 06, 1960

## 2015-02-21 ENCOUNTER — Ambulatory Visit: Payer: Managed Care, Other (non HMO) | Admitting: Physical Therapy

## 2015-02-21 DIAGNOSIS — R42 Dizziness and giddiness: Secondary | ICD-10-CM

## 2015-02-21 DIAGNOSIS — R269 Unspecified abnormalities of gait and mobility: Secondary | ICD-10-CM

## 2015-02-21 DIAGNOSIS — R2681 Unsteadiness on feet: Secondary | ICD-10-CM

## 2015-02-21 NOTE — Therapy (Signed)
[facility] [address] [place], [place], [address] Phone: [phone]   Fax:  [phone]  Physical Therapy Treatment  Patient Details  Name: Crystal Park MRN: [id number] Date of Birth: 1961/02/17 Referring Provider: Arlice Colt, MD  Encounter Date: 02/21/2015      PT End of Session - 02/21/15 0911    Visit Number 12   Number of Visits 17   Date for PT Re-Evaluation 03/08/15   Authorization Type [employer] - 20 visit limit for PT   Authorization - Visit Number 12   Authorization - Number of Visits 20   PT Start Time 0802   PT Stop Time 0846   PT Time Calculation (min) 44 min   Equipment Utilized During Treatment Gait belt   Activity Tolerance Patient tolerated treatment well   Behavior During Therapy [facility] for tasks assessed/performed      Past Medical History  Diagnosis Date  . Benign paroxysmal positional vertigo 10/12/2014    No past surgical history on file.  There were no vitals filed for this visit.  Visit Diagnosis:  Abnormality of gait  Dizziness and giddiness  Unsteadiness      Subjective Assessment - 02/21/15 0804    Subjective Pt reports increased tolerance to riding in the car. Pt reports she did receive L FootUp brace and feels as though the brace is helping. 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 No/denies                          [facility] Adult PT Treatment/Exercise - 02/21/15 0001    Transfers   Sit to Stand 6: Modified independent (Device/Increase time)   Sit to Stand Details (indicate cue type and reason) increased time   Ambulation/Gait   Ambulation/Gait Yes   Ambulation/Gait Assistance 5: Supervision;6: Modified independent (Device/Increase time)   Ambulation/Gait Assistance Details Mod I using SPC and L FootUp brace; supervision with L FootUp brace and no AD. Noted no episodes of L toe catch/drag throughout this session.   Ambulation Distance (Feet) 840 Feet   Assistive device Straight cane;None;Other (Comment)  L FootUp brace   Gait Pattern Step-through pattern;Decreased stride length;Decreased stance time - right;Decreased stance time - left;Decreased weight shift to left  BOS more normalized as compared with previous sessions   Ambulation Surface Level;Indoor   Ramp 5: Supervision   Ramp Details (indicate cue type and reason) with SPC and L Foot Up brace.   Curb 5: Supervision;6: Modified independent (Device/increase time)   Curb Details (indicate cue type and reason) with SPC and L Foot Up brace. Performed x3 consecutive trials, initially requireing verbal/demo cueing for sequencing with SPC with effective return demo from pt for subsequent 2 trials.   Gait Comments Gait 2 x210' and 1 x420' to decrease disequilibrium between balance activities, pt exhibited decreaased lateral weight shift to L side and decreased B  arm swing. Cueing focused on decreasing RLE step length and increasing LLE step length to facilitate normalized lateral weght shift to L side. Also provided cueing for increased reciprocal arm swing.   Self-Care   Self-Care Other Self-Care Comments   Other Self-Care Comments  During seated rest breaks (due to increased disequilibrium), provided education on resources offered by [employer] and provided brochure; also educated pt on sensory reorganization  technique to decrease disequilibrium with effective return demo from pt. Pt did note improvement in symptoms with use of technique.   Neuro Re-ed    Neuro Re-ed Details  Standing on large rocker board (oriented for lateral instabiity) at parallel bars without UE support with mirror anterior to pt for visual feedback, pt performed static standing 2 x30-sec holds initially with cueing to use mirror for midline orientation (due to lateral trunk lean to L side); progressed to horizontal head turns x3 then vertical head turns x2 with cueing to utilize mirror throughout for midline orientation due to more prominent lateral trunk lean to L side. Pt reported less disequilibrium and exhibited increased midline orientation during subsequent trial, in which pt used used visual targets for gaze fixation with head turns in all directions.     Exercises   Exercises --             Balance Exercises - 02/21/15 0823    Balance Exercises: Standing   Rockerboard Lateral;Head turns;EO;5 reps  large board; mirror for visual feedback of lateral lean to L           PT Education - 02/21/15 0856    Education provided Yes   Education Details Sensory reorganization technique to decrease disequilibrium. Info on [employer] and resources offered.   Person(s) Educated Patient   Methods Explanation;Handout;Demonstration;Verbal cues   Comprehension Verbalized understanding;Returned demonstration          PT Short Term Goals - 02/18/15 0830    PT SHORT TERM GOAL #1   Title Pt will perform initial HEP with mod I using paper handout to maximize progress made in PT. Target date: 02/04/15   Baseline Met 11/30.   Status Achieved   PT SHORT TERM GOAL #2   Title During gait x150', pt will utilize compensatory strategies for dizziness/disequilibrium without cueing to increase pt safety with ambulation. Target date: 02/04/15   Baseline Met 11/30.   Status Achieved   PT SHORT TERM GOAL #3   Title Pt will  ambulate x200' over level, indoor surfaces with effective obstacle negotiation with mod I using LRAD to indicate safety with household mobility. Target date: 02/04/15   Baseline Met 11/30.   Status Achieved   PT SHORT TERM GOAL #4   Title Pt will improve gait speed from 2.34 ft/sec to > 2.62 ft/sec to indicate status of community ambulatory. Target date: 02/04/15   Baseline Met 11/30. Gait velocity = 2.67 ft/sec   Status Achieved   PT SHORT TERM GOAL #5   Title Pt will negotiate 16 stairs with single rail with with no more than 2-point increase in subjective dizziness to increase pt tolerance to stair negotiation in home. Target date: 02/06/15   Baseline Met 12/16.   Status Achieved           PT Long Term Goals - 02/19/15 1408    PT LONG TERM GOAL #1   Title Pt will perform dynamic standing task for 10 consecutive minutes with no more than 3-point increase in subjective dizziness to indicate pt  ability to unload dishwasher. Target date: 03/04/15   PT LONG TERM GOAL #2   Title Pt will decrease DHI score from 84 to </= 61 to indicate decreased pt-perceived disability due to dizziness.  Target date: 03/04/15   PT LONG TERM GOAL #3   Title Pt will independently negotiate 16 stairs with single rail using reciprocal pattern to indicate increased stability/independence accessing second floor of home. Target date: 03/04/15   Baseline Met 12/16.   Status Achieved   PT LONG TERM GOAL #4   Title Pt will ambulate 500' over unlevel, paved surfaces with mod I using LRAD to indicate increased safety with community mobility. Target date: 03/04/15   PT LONG TERM GOAL #5   Title Pt will report ability to her walk dog without loss of balance to indicate safe return to leisure activities. Target date: 03/04/15               Plan - 02/21/15 4492    Clinical Impression Statement Skilled session focused on increasing pt awareness of impaired midline orientation during dynamic standing balance  activities and when performing functional head turns. Mirror effective in providing visual feedback of lateral trunk lean to L side during standing balance activities. Pt demonstrated improved midline orientation and improved tolerance to head turns on dynamic surface with use of gaze fixation. Pt also exhibited significant improvement in L foot clearance (no L toe catch throughout session) when wearing recently purchase L FootUp brace.    Pt will benefit from skilled therapeutic intervention in order to improve on the following deficits Dizziness;Decreased balance;Decreased activity tolerance;Abnormal gait;Decreased coordination;Impaired vision/preception   Rehab Potential Good   PT Frequency 2x / week   PT Duration 8 weeks   PT Treatment/Interventions ADLs/Self Care Home Management;Gait training;Stair training;Functional mobility training;Therapeutic activities;Vestibular;Visual/perceptual remediation/compensation;Neuromuscular re-education;Patient/family education;Therapeutic exercise;Balance training   PT Next Visit Plan Try gait with head turns, acceleration/deceleration (if pt tolerates). Consider simulating walking dog.   Consulted and Agree with Plan of Care Patient        Problem List Patient Active Problem List   Diagnosis Date Noted  . Multiple sclerosis ([place]) 10/26/2014  . Benign paroxysmal positional vertigo 10/12/2014  . 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 Urinary frequency 10/12/2014  . Other fatigue 10/12/2014  . Numbness 10/12/2014    Billie Ruddy, PT, DPT [facility] [address] [place], [place], [address] Phone: [phone]   Fax:  [phone] 02/21/2015, 9:17 AM   Name: NIHAL DOAN MRN: [id number] Date of Birth: 1960/10/09

## 2015-02-23 ENCOUNTER — Ambulatory Visit: Payer: Managed Care, Other (non HMO) | Admitting: Physical Therapy

## 2015-02-23 DIAGNOSIS — R42 Dizziness and giddiness: Secondary | ICD-10-CM

## 2015-02-23 DIAGNOSIS — R269 Unspecified abnormalities of gait and mobility: Secondary | ICD-10-CM

## 2015-02-23 DIAGNOSIS — R2681 Unsteadiness on feet: Secondary | ICD-10-CM

## 2015-02-23 NOTE — Therapy (Signed)
[facility] [address] [place], [place], [address] Phone: [phone]   Fax:  [phone]  Physical Therapy Treatment  Patient Details  Name: Crystal Park MRN: [id number] Date of Birth: 10/23/1960 Referring Provider: Arlice Colt, MD  Encounter Date: 02/23/2015      PT End of Session - 02/23/15 1716    Visit Number 13   Number of Visits 17   Date for PT Re-Evaluation 03/08/15   Authorization Type [employer] - 20 visit limit for PT   Authorization - Visit Number 13   Authorization - Number of Visits 20   PT Start Time 0803   PT Stop Time 9485   PT Time Calculation (min) 44 min   Equipment Utilized During Treatment Other (comment)  Balance Master   Activity Tolerance Patient tolerated treatment well   Behavior During Therapy [facility] for tasks assessed/performed      Past Medical History  Diagnosis Date  . Benign paroxysmal positional vertigo 10/12/2014    No past surgical history on file.  There were no vitals filed for this visit.  Visit Diagnosis:  Abnormality of gait  Dizziness and giddiness  Unsteadiness      Subjective Assessment - 02/23/15 0806    Subjective Pt reports she went for a short walk with husband last night but pt reports not feeling too stable during walk. 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 No/denies                         [facility] Adult PT Treatment/Exercise - 02/23/15 0001    Ambulation/Gait   Ambulation/Gait Yes   Ambulation/Gait Assistance  5: Supervision;6: Modified independent (Device/Increase time)   Ambulation/Gait Assistance Details Mod I prior to Balance Master activities; supervision after Balance Master due to increased disequilibrium.   Ambulation Distance (Feet) 275 Feet   Assistive device Straight cane;None;Other (Comment)  L FootUp brace; quad tip attachment on SPC for final 100'   Gait Pattern Step-through pattern;Decreased stride length;Decreased stance time - right;Decreased stance time - left;Decreased weight shift to left  BOS more normalized as compared with previous sessions   Ambulation Surface Level;Indoor             Balance Exercises - 02/23/15 1710    Balance Exercises: Standing   Balance Master: Limits for Stability Balance Master: Center align 2 x1 minute for midline orientation. Transitioned to the following LOS activities with 10-second time for target change, 25% LOS with stable force plate and surround, with the following visual targets: Center/anterior 4 trials x1 minute; center/posterior x1 minute; center/Rt x2 minutes; center to Rt/lateral 2 trials x1 minute; center/R semi circle 2 trials x2 minutes. Activities focused on midline orientation, increasing  pt comfort with lateral weight shift to R and with anterior weight shift; and on use of visual system to compensate for vestibular impairments. Cueing focused on avoiding excessive R trunk rotation to compensate for lateral weight shift to R side.  PT Education - 02/23/15 0854    Education provided Yes   Education Details Option of quad tip cane attachment on [facility] for increased gait stability.   Person(s) Educated Patient   Methods Explanation;Demonstration   Comprehension Verbalized understanding          PT Short Term Goals - 02/18/15 0830    PT SHORT TERM GOAL #1   Title Pt will perform initial HEP with mod I using paper handout to maximize progress made in PT. Target date: 02/04/15   Baseline Met 11/30.   Status  Achieved   PT SHORT TERM GOAL #2   Title During gait x150', pt will utilize compensatory strategies for dizziness/disequilibrium without cueing to increase pt safety with ambulation. Target date: 02/04/15   Baseline Met 11/30.   Status Achieved   PT SHORT TERM GOAL #3   Title Pt will ambulate x200' over level, indoor surfaces with effective obstacle negotiation with mod I using LRAD to indicate safety with household mobility. Target date: 02/04/15   Baseline Met 11/30.   Status Achieved   PT SHORT TERM GOAL #4   Title Pt will improve gait speed from 2.34 ft/sec to > 2.62 ft/sec to indicate status of community ambulatory. Target date: 02/04/15   Baseline Met 11/30. Gait velocity = 2.67 ft/sec   Status Achieved   PT SHORT TERM GOAL #5   Title Pt will negotiate 16 stairs with single rail with with no more than 2-point increase in subjective dizziness to increase pt tolerance to stair negotiation in home. Target date: 02/06/15   Baseline Met 12/16.   Status Achieved           PT Long Term Goals - 02/19/15 1408    PT LONG TERM GOAL #1   Title Pt will perform dynamic standing task for 10 consecutive minutes with no more than 3-point increase in subjective dizziness to indicate pt ability to unload dishwasher. Target date: 03/04/15   PT LONG TERM GOAL #2   Title Pt will decrease DHI score from 84 to </= 61 to indicate decreased pt-perceived disability due to dizziness.  Target date: 03/04/15   PT LONG TERM GOAL #3   Title Pt will independently negotiate 16 stairs with single rail using reciprocal pattern to indicate increased stability/independence accessing second floor of home. Target date: 03/04/15   Baseline Met 12/16.   Status Achieved   PT LONG TERM GOAL #4   Title Pt will ambulate 500' over unlevel, paved surfaces with mod I using LRAD to indicate increased safety with community mobility. Target date: 03/04/15   PT LONG TERM GOAL #5   Title Pt will report ability to her walk dog  without loss of balance to indicate safe return to leisure activities. Target date: 03/04/15               Plan - 02/23/15 1717    Clinical Impression Statement Session focused on use of Balance Master to promote midline orientation (secondary to L lateropulsion), to increase postural stability/control with lateral weight shift to R side, and to continue to promote substitution/compensation for central vestibular impairments. Following Balance Master Activities, reported increased disequilibrium and exhibited decreased gait stability.   Pt will benefit from skilled therapeutic intervention in order to improve on the following deficits Dizziness;Decreased balance;Decreased activity tolerance;Abnormal gait;Decreased coordination;Impaired vision/preception   Rehab Potential Good   PT Frequency 2x / week   PT Duration 8 weeks   PT Treatment/Interventions ADLs/Self Care Home Management;Gait training;Stair training;Functional mobility  training;Therapeutic activities;Vestibular;Visual/perceptual remediation/compensation;Neuromuscular re-education;Patient/family education;Therapeutic exercise;Balance training   PT Next Visit Plan Try gait with head turns, acceleration/deceleration (if pt tolerates). Consider simulating walking dog.   Consulted and Agree with Plan of Care Patient        Problem List Patient Active Problem List   Diagnosis Date Noted  . Multiple sclerosis ([place]) 10/26/2014  . Benign paroxysmal positional vertigo 10/12/2014  . 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 Urinary frequency 10/12/2014  . Other fatigue 10/12/2014  . Numbness 10/12/2014   Billie Ruddy, PT, DPT [facility] [address] [place], [place], [address] Phone: [phone]   Fax:  [phone] 02/23/2015, 5:28 PM   Name: Crystal Park MRN: [id number] Date of Birth: [birth year]

## 2015-03-02 ENCOUNTER — Ambulatory Visit: Payer: Managed Care, Other (non HMO) | Admitting: Physical Therapy

## 2015-03-02 DIAGNOSIS — R2681 Unsteadiness on feet: Secondary | ICD-10-CM

## 2015-03-02 DIAGNOSIS — R42 Dizziness and giddiness: Secondary | ICD-10-CM | POA: Diagnosis not present

## 2015-03-02 DIAGNOSIS — R269 Unspecified abnormalities of gait and mobility: Secondary | ICD-10-CM

## 2015-03-02 NOTE — Therapy (Signed)
[facility] [address] [place], [place], [address] Phone: [phone]   Fax:  [phone]  Physical Therapy Treatment  Patient Details  Name: Crystal Park MRN: [id number] Date of Birth: 1960-12-13 Referring Provider: Arlice Colt, MD  Encounter Date: 03/02/2015      PT End of Session - 03/02/15 1258    Visit Number 14   Number of Visits 17   Date for PT Re-Evaluation 03/08/15   Authorization Type [employer] - 20 visit limit for PT   Authorization - Visit Number 14   Authorization - Number of Visits 20   PT Start Time 0800   PT Stop Time 4709   PT Time Calculation (min) 47 min   Activity Tolerance Patient tolerated treatment well   Behavior During Therapy [facility] for tasks assessed/performed      Past Medical History  Diagnosis Date  . Benign paroxysmal positional vertigo 10/12/2014    No past surgical history on file.  There were no vitals filed for this visit.  Visit Diagnosis:  Abnormality of gait  Dizziness and giddiness  Unsteadiness      Subjective Assessment - 03/02/15 1247    Subjective "I really want to continue therapy. 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 No/denies                Vestibular Assessment - 03/02/15 0001    Positional Sensitivities   Supine to Left Side Mild dizziness   Supine to Right Side Moderate dizziness   Nose to  Right Knee Mild dizziness   Right Knee to Sitting Severe dizziness   Nose to Left Knee Moderate dizziness   Left Knee to Sitting Severe dizziness   Head Turning x 5 Moderate dizziness  for < 3 seconds before returning to baseline   Head Nodding x 5 Moderate dizziness  for < 3 seconds before returning to baseline 2/5   Positional Sensitivities Comments R/ L side lying > sit: 3/5 dizziness initially but decreased to 2/10 within seconds.                 [facility] Adult PT Treatment/Exercise - 03/02/15 0001    Ambulation/Gait   Ambulation/Gait Yes   Ambulation/Gait Assistance 6: Modified independent (Device/Increase time)   Ambulation Distance (Feet) 350 Feet   Assistive device Straight cane;None;Other (Comment)  L FootUp brace   Gait Pattern Step-through pattern;Decreased stride length;Decreased weight shift to left  BOS more normalized as compared with previous sessions   Ambulation Surface Level;Indoor         Vestibular Treatment/Exercise - 03/02/15 0001    Vestibular Treatment/Exercise   Vestibular Treatment Provided Habituation   Habituation Exercises Comment  Seated: nose to R/L knee x3 reps per side   Gaze Exercises X1 Viewing Horizontal;X1 Viewing Vertical;Eye/Head Exercise Horizontal;Eye/Head Exercise Vertical   X1 Viewing Horizontal   Foot Position standing   Reps 30  Comments ended due to increased disequilibrium, postural instability (significant A/P sway)   X1 Viewing Vertical   Foot Position standing   Reps 20   Comments ended secondary to disequilibrium, postural sway   Eye/Head Exercise Horizontal   Foot Position standing without UE support   Reps 20   Comments No increase in symptoms   Eye/Head Exercise Vertical   Foot Position standing without UE support   Reps 5   Comments Reps limited by increased disequilibrium            Balance Exercises - 03/02/15 1251    Balance Exercises: Standing   Standing Eyes Opened Foam/compliant surface;Head  turns;Other (comment)  1 pillow; vertical x5; horiz. x10 with spotting   Standing Eyes Closed Wide (BOA);Solid surface;1 rep;30 secs           PT Education - 03/02/15 1250    Education provided Yes   Education Details Modified HEP; see Pt Instructions.   Person(s) Educated Patient   Methods Explanation;Demonstration;Handout;Verbal cues   Comprehension Verbalized understanding;Returned demonstration          PT Short Term Goals - 02/18/15 0830    PT SHORT TERM GOAL #1   Title Pt will perform initial HEP with mod I using paper handout to maximize progress made in PT. Target date: 02/04/15   Baseline Met 11/30.   Status Achieved   PT SHORT TERM GOAL #2   Title During gait x150', pt will utilize compensatory strategies for dizziness/disequilibrium without cueing to increase pt safety with ambulation. Target date: 02/04/15   Baseline Met 11/30.   Status Achieved   PT SHORT TERM GOAL #3   Title Pt will ambulate x200' over level, indoor surfaces with effective obstacle negotiation with mod I using LRAD to indicate safety with household mobility. Target date: 02/04/15   Baseline Met 11/30.   Status Achieved   PT SHORT TERM GOAL #4   Title Pt will improve gait speed from 2.34 ft/sec to > 2.62 ft/sec to indicate status of community ambulatory. Target date: 02/04/15   Baseline Met 11/30. Gait velocity = 2.67 ft/sec   Status Achieved   PT SHORT TERM GOAL #5   Title Pt will negotiate 16 stairs with single rail with with no more than 2-point increase in subjective dizziness to increase pt tolerance to stair negotiation in home. Target date: 02/06/15   Baseline Met 12/16.   Status Achieved           PT Long Term Goals - 02/19/15 1408    PT LONG TERM GOAL #1   Title Pt will perform dynamic standing task for 10 consecutive minutes with no more than 3-point increase in subjective dizziness to indicate pt ability to unload dishwasher. Target date: 03/04/15   PT LONG TERM GOAL #2   Title Pt  will decrease DHI score from 84 to </= 61 to indicate decreased pt-perceived disability due to dizziness.  Target date: 03/04/15   PT LONG TERM GOAL #3   Title Pt will independently negotiate 16 stairs with single rail using reciprocal pattern to indicate increased stability/independence accessing second floor of home. Target date: 03/04/15   Baseline Met 12/16.   Status Achieved   PT LONG TERM GOAL #4   Title Pt will ambulate 500' over unlevel, paved surfaces with mod I using LRAD to indicate increased safety with community mobility. Target date: 03/04/15   PT LONG TERM GOAL #5   Title Pt will report ability to her walk dog without  loss of balance to indicate safe return to leisure activities. Target date: 03/04/15               Plan - 03/02/15 1259    Clinical Impression Statement Session focused on habituation, compensation for central vestibular impairments, and modification of HEP. Pt reports significant improvement in quality of life since beginning PT. Pt reports ongoing motion sensitivity with sit <> supine, with forward bending (to do laundry and dishes), and with vertical > horizontal head turns. Gait stability much improved since PT evaluation, as pt was mod I for all indoor ambulation today. Discussed extending POC for additional 4 weeks to address dynamic gait stability. Pt in agreement with tentative POC. HEP modified to remove gaze stabilization and to focus more on gaze fixation/ spotting during head turns.    Pt will benefit from skilled therapeutic intervention in order to improve on the following deficits Dizziness;Decreased balance;Decreased activity tolerance;Abnormal gait;Decreased coordination;Impaired vision/preception   Rehab Potential Good   PT Frequency 2x / week   PT Duration 8 weeks   PT Treatment/Interventions ADLs/Self Care Home Management;Gait training;Stair training;Functional mobility training;Therapeutic activities;Vestibular;Visual/perceptual  remediation/compensation;Neuromuscular re-education;Patient/family education;Therapeutic exercise;Balance training   PT Next Visit Plan Check LTG's and plan to recert. Perform DGI or FGA and add goal for dynamic gait stability.   Consulted and Agree with Plan of Care Patient        Problem List Patient Active Problem List   Diagnosis Date Noted  . Multiple sclerosis ([place]) 10/26/2014  . Benign paroxysmal positional vertigo 10/12/2014  . 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 Urinary frequency 10/12/2014  . Other fatigue 10/12/2014  . Numbness 10/12/2014    Billie Ruddy, PT, DPT [facility] [address] [place], [place], [address] Phone: [phone]   Fax:  [phone] 03/02/2015, 1:07 PM   Name: Crystal Park MRN: [id number] Date of Birth: 1960/08/09

## 2015-03-02 NOTE — Patient Instructions (Addendum)
Tip Card  1.The goal of habituation training is to assist in decreasing symptoms of vertigo, dizziness, or nausea provoked by specific head and body motions. 2.These exercises may initially increase symptoms; however, be persistent and work through symptoms. With repetition and time, the exercises will assist in reducing or eliminating symptoms. 3.Exercises should be stopped and discussed with the therapist if you experience any of the following: - Sudden change or fluctuation in hearing - New onset of ringing in the ears, or increase in current intensity - Any fluid discharge from the ear - Severe pain in neck or back - Extreme nausea  Nose to Knee    Start in seated. Bring your nose to your LEFT knee (as pictured). Wait for symptoms to resolve plus 10 seconds; then return to seated, wait for symptoms to resolve plus 10 seconds. Then, bring your nose to your RIGHT knee. Wait for symptoms to resolve plus 10 seconds; then return to seated; wait for symptoms to resolve plus 10 seconds.   Perform this exercise 5 times per side. Do this twice per day.    Feet Together, Varied Arm Positions - Eyes Open - Arms by your side    Stand with back to corner with stable chair in front of you and with feet shoulder width apart and arms by your side. Look straight ahead at a stationary object. Hold __30__ seconds. Repeat __2__ times per session. Do __2__ sessions per day.   Feet Heel-Toe "Tandem", Varied Arm Positions - Eyes Open - Arms by your side    Stand with back to corner with stable chair in front of you and arms by your side. Try not to hold onto chair as you place the toes of your LEFT foot next to the heel of your RIGHT foot. Look straight ahead at a stationary object. Hold __30__ seconds. Switch foot position. Perform exercise one time with each foot in front. Do __2__ sessions per day.    Feet Apart (Compliant Surface) Varied Arm Positions - Eyes Closed    Stand with your back to a  corner with a stable chair in front of you. Stand on 1 pillow with feet shoulder width apart and arms by your side.   1. Close eyes and visualize upright position. Hold__30__ seconds. Repeat __4__ times per session. Do __2__ sessions per day.  2. While still standing on the pillow, open your eyes and turn your head from right to left 10 times, visually "spotting" object on the wall. Then turn head up to down ("nodding) 5 times while visually spotting object on wall  - For now, don't do gaze stabilization ("A" exercise) or sit to/from side lying habituation exercises.

## 2015-03-04 ENCOUNTER — Ambulatory Visit: Payer: Managed Care, Other (non HMO) | Admitting: Physical Therapy

## 2015-03-04 DIAGNOSIS — R2681 Unsteadiness on feet: Secondary | ICD-10-CM

## 2015-03-04 DIAGNOSIS — R269 Unspecified abnormalities of gait and mobility: Secondary | ICD-10-CM

## 2015-03-04 DIAGNOSIS — R42 Dizziness and giddiness: Secondary | ICD-10-CM

## 2015-03-04 NOTE — Therapy (Signed)
[facility] [address] [place], [place], [address] Phone: [phone]   Fax:  [phone]  Physical Therapy Treatment  Patient Details  Name: Crystal Park MRN: [id number] Date of Birth: 11/11/1960 Referring Provider: Arlice Colt, MD  Encounter Date: 03/04/2015      PT End of Session - 03/04/15 0902    Visit Number 15   Number of Visits 23  requesting additional 2x/week for 4 weeks   Date for PT Re-Evaluation 04/03/15   Authorization Type [employer] - 20 visit limit for PT   Authorization - Visit Number 15  Will reset at visit #1 after 03/05/15   Authorization - Number of Visits 20   PT Start Time 0801   PT Stop Time 0849   PT Time Calculation (min) 48 min   Equipment Utilized During Treatment Gait belt   Activity Tolerance Other (comment)  Frequent rest breaks required due to dizziness/disequilibrium   Behavior During Therapy [facility] for tasks assessed/performed      Past Medical History  Diagnosis Date  . Benign paroxysmal positional vertigo 10/12/2014    No past surgical history on file.  There were no vitals filed for this visit.  Visit Diagnosis:  Abnormality of gait  Dizziness and giddiness  Unsteadiness      Subjective Assessment - 03/04/15 0804    Subjective "The exercise where I'm bringing my nose to my knee.. that one is okay when I'm going down, but it's not great when I come back up. 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 No/denies                         [facility] Adult PT Treatment/Exercise - 03/04/15 0001    Ambulation/Gait   Ambulation/Gait Yes   Ambulation/Gait Assistance 6: Modified independent (Device/Increase time);5: Supervision;4: Min guard   Ambulation/Gait Assistance Details Mod I for gait x600' over level/unlevel paved surfaces with SPC and L Foot Up brace without concurrent head turns, obstacle negotiation. Pt required supervision to min guard for dynamic gait; see Dynamic Gait Index for details on gait stability in the presence of external demands.    Ambulation Distance (Feet) 600 Feet   Assistive device Straight cane;None;Other (Comment)  L FootUp brace   Gait Pattern Step-through pattern;Decreased stride length;Decreased weight shift to left;Left foot flat  BOS more normalized as compared with previous sessions   Ambulation Surface Level;Unlevel;Indoor;Outdoor;Paved   Stairs Yes   Stairs Assistance 7: Independent   Stair Management Technique One rail Right;Alternating pattern;Backwards   Number of Stairs 4   Height of Stairs 6   Standardized Balance Assessment   Standardized Balance Assessment Dynamic Gait Index   Dynamic Gait Index   Level Surface Mild Impairment  using SPC   Change in Gait Speed Moderate Impairment   Gait with Horizontal Head Turns Moderate Impairment   Gait with Vertical Head Turns Severe Impairment  anterior LOB with effective self-recovery  Gait and Pivot Turn Severe Impairment  posterior LOB   Step Over Obstacle Moderate Impairment   Step Around Obstacles Mild Impairment   Steps Mild Impairment   Total Score 9   Neuro Re-ed    Neuro Re-ed Details  To address LTG 1, pt engaged in dynamic standing task involving reaching up to kitchen shelf for dishes and placing dishes on countertop, then placing dishes back onto shelf x5 minutes, 10 seconds prior to request for seated rest break due to  increased dizziness/disequilibrium. Symptoms remained at baseline 3/10 when pt able to maintain forward gaze; however, symptoms increased to 4-5/10 when pt needed to move head to see/reach mugs on high shelf.                   PT Short Term Goals - 02/18/15 0830    PT SHORT TERM GOAL #1   Title Pt will perform initial HEP with mod I using paper handout to maximize progress made in PT. Target date: 02/04/15   Baseline Met 11/30.   Status Achieved   PT SHORT TERM GOAL #2   Title During gait x150', pt will utilize compensatory strategies for dizziness/disequilibrium without cueing to increase pt safety with ambulation. Target date: 02/04/15   Baseline Met 11/30.   Status Achieved   PT SHORT TERM GOAL #3   Title Pt will ambulate x200' over level, indoor surfaces with effective obstacle negotiation with mod I using LRAD to indicate safety with household mobility. Target date: 02/04/15   Baseline Met 11/30.   Status Achieved   PT SHORT TERM GOAL #4   Title Pt will improve gait speed from 2.34 ft/sec to > 2.62 ft/sec to indicate status of community ambulatory. Target date: 02/04/15   Baseline Met 11/30. Gait velocity = 2.67 ft/sec   Status Achieved   PT SHORT TERM GOAL #5   Title Pt will negotiate 16 stairs with single rail with with no more than 2-point increase in subjective dizziness to increase pt tolerance to stair negotiation in home. Target date: 02/06/15   Baseline Met 12/16.   Status Achieved          PT Long Term Goals - 03/04/15 0981    PT LONG TERM GOAL #1   Title Pt will perform dynamic standing task for 10 consecutive minutes with no more than 3-point increase in subjective dizziness to indicate pt ability to unload dishwasher. Target date: 03/04/15   Baseline 12/30: Pt requested seated rest break after 5 minutes, 15 seconds due to dizziness/disequilibrium.    Status Not Met   PT LONG TERM GOAL #2   Title Pt will decrease DHI score from 84 to </= 61 to indicate  decreased pt-perceived disability due to dizziness.  Target date: 03/04/15   Status On-going   PT LONG TERM GOAL #3   Title Pt will independently negotiate 16 stairs with single rail using reciprocal pattern to indicate increased stability/independence accessing second floor of home. Target date: 03/04/15   Baseline Met 12/16.   Status Achieved   PT LONG TERM GOAL #4   Title Pt will ambulate 500' over unlevel, paved surfaces with mod I using LRAD to indicate increased safety with community mobility. Target date: 03/04/15   Baseline Met 12/30 using SPC and L Foot Up brace.   Status Achieved   PT LONG TERM GOAL #5   Title Pt will report ability to her walk dog without loss of balance to indicate safe return to leisure activities. Target  date: 03/04/15   Baseline 12/30: Pt able to walk dog only to mail box due to disequilibrium with acceleration/deceleration.      Status Partially Met                       PT Long Term Goals - 03/04/15 0539    PT LONG TERM GOAL #1   Title Pt will perform dynamic standing task for 10 consecutive minutes without seated rest break to indicate pt ability to unload dishwasher. Modified target date: 04/01/15   Baseline 12/30: Pt requested seated rest break after 5 minutes, 15 seconds due to dizziness/disequilibrium.  REVISED to remove subjective dizziness rating.   Status Revised   PT LONG TERM GOAL #2   Title Pt will decrease DHI score from 84 to </= 61 to indicate decreased pt-perceived disability due to dizziness.  Modified target date: 04/01/15   Baseline Continue goal through renewed POC.   Status On-going   PT LONG TERM GOAL #3   Title Pt will independently negotiate 16 stairs with single rail using reciprocal pattern to indicate increased stability/independence accessing second floor of home. Target date: 03/04/15   Baseline Met 12/16.   Status Achieved   PT LONG TERM GOAL #4   Title Pt will ambulate 500' over unlevel, paved surfaces with mod I using  LRAD to indicate increased safety with community mobility. Target date: 03/04/15   Baseline Met 12/30 using SPC and L Foot Up brace.   Status Achieved   PT LONG TERM GOAL #5   Title Pt will report ability to her walk dog for >/= 10 minutes without loss of balance to indicate safe return to leisure activities. Modified target date: 04/01/15   Baseline 12/30: Pt able to walk dog only to mail box due to disequilibrium with acceleration/deceleration.   REVISED to add >/= 10 minutes.  Continue goal through renewed POC.   Status Partially Met   Additional Long Term Goals   Additional Long Term Goals Yes   PT LONG TERM GOAL #6   Title Pt will improve DGI score from 9/24 to 14/24 to indicate decreased fall risk, increased dynamic gait stability. Target date: 04/01/15   Status New   PT LONG TERM GOAL #7   Title Pt will negotiate standard ramp and curb step with mod I using LRAD to indicate ability to safely traverse community obstacles. Target date: 04/01/15   Status New               Plan - 03/04/15 0911    Clinical Impression Statement Pt has met all STG's, met 3 of 5 LTG's, and partially met 1 LTG for ability to walk dog (as pt is able to walk dog without LOB, but only a short distance to mailbox). Since beginning this episode of outpatient PT, pt has exhibited motivation and excellent compliance with all recommendations, education, and HEP.  Pt has improved stability/independence with functional mobility, increased tolerance to all mobility (bed mobility, stair negotiation, and ambulation), and is now able to tolerate static horizontal and vertical head turns. Pt continues to demonstrate decreased tolerance to/stability with functional head turns during dynamic activities (unloading dishwasher in standing). DGI score of 9/24 indicates signficant impairments in dynamic gait stability and indicates increased fall risk (scores < 12/24 for patients with MS). Pt will continue to benefit from skilled  outpatient PT 2x/week for 4 additional weeks to address said impairments, decrease fall risk, and maximize safety with/tolerance to functional mobility.  Pt will benefit from skilled therapeutic intervention in order to improve on the following deficits Dizziness;Decreased balance;Decreased activity tolerance;Abnormal gait;Decreased coordination;Impaired sensation;Decreased strength;Impaired tone;Impaired vision/preception   Rehab Potential Good   PT Frequency 2x / week   PT Duration 4 weeks   PT Treatment/Interventions ADLs/Self Care Home Management;Gait training;Stair training;Functional mobility training;Therapeutic activities;Vestibular;Visual/perceptual remediation/compensation;Neuromuscular re-education;Patient/family education;Therapeutic exercise;Balance training;DME Instruction;Orthotic Fit/Training;Energy conservation   PT Next Visit Plan Standing balance and dynamic gait activities; add gait with head turns to HEP.   Consulted and Agree with Plan of Care Patient        Problem List Patient Active Problem List   Diagnosis Date Noted  . Multiple sclerosis ([place]) 10/26/2014  . Benign paroxysmal positional vertigo 10/12/2014  . 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 Urinary frequency 10/12/2014  . Other fatigue 10/12/2014  . Numbness 10/12/2014    Billie Ruddy, PT, DPT [facility] [address] [place], [place], [address] Phone: [phone]   Fax:  [phone] 03/04/2015, 9:27 AM  Name: Crystal Park MRN: [id number] Date of Birth: June 15, 1960

## 2015-03-09 ENCOUNTER — Encounter: Payer: Self-pay | Admitting: Rehabilitation

## 2015-03-09 ENCOUNTER — Ambulatory Visit: Payer: Managed Care, Other (non HMO) | Attending: Neurology | Admitting: Rehabilitation

## 2015-03-09 DIAGNOSIS — R2681 Unsteadiness on feet: Secondary | ICD-10-CM | POA: Diagnosis present

## 2015-03-09 DIAGNOSIS — R42 Dizziness and giddiness: Secondary | ICD-10-CM | POA: Diagnosis present

## 2015-03-09 DIAGNOSIS — R531 Weakness: Secondary | ICD-10-CM | POA: Diagnosis present

## 2015-03-09 DIAGNOSIS — R269 Unspecified abnormalities of gait and mobility: Secondary | ICD-10-CM | POA: Insufficient documentation

## 2015-03-09 NOTE — Therapy (Signed)
[facility] [address] [place], [place], [address] Phone: [phone]   Fax:  [phone]  Physical Therapy Treatment  Patient Details  Name: Crystal Park MRN: [id number] Date of Birth: [birth year] Referring Provider: Despina Arias, MD  Encounter Date: 03/09/2015      PT End of Session - 03/09/15 0803    Visit Number 16   Number of Visits 23  requesting additional 2x/week for 4 weeks   Date for PT Re-Evaluation 04/03/15   Authorization Type [employer] - 20 visit limit for PT   Authorization - Visit Number 1   Authorization - Number of Visits 20   PT Start Time 0800   PT Stop Time 0846   PT Time Calculation (min) 46 min   Equipment Utilized During Treatment Gait belt   Activity Tolerance Other (comment)  Frequent rest breaks required due to dizziness/disequilibrium   Behavior During Therapy [facility] for tasks assessed/performed      Past Medical History  Diagnosis Date  . Benign paroxysmal positional vertigo 10/12/2014    History reviewed. No pertinent past surgical history.  There were no vitals filed for this visit.  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 No/denies           Self Care:  Pt reports she  folds laundry and unloads dishwasher.  Keeps laundry mostly at eye level so that she doesn't have to look up and down.  She has been breaking down dishwasher task to doing upper level then sitting and resting, lower level, sitting and resting then doing silverware as it has been "bad week."  Educated pt on compensatory strategies in which she seems to be doing at home per pt verbalizing understanding.   NMR:  Performed knee to nose task x 5 reps as stated in pt instruction.  Continues to have increased difficulty with sitting back up from knee to nose, but did improve over session (esp from the L).  Transitioned to performing gait with head turns along counter top for increased UE support and safety to add to HEP.  Performed x 3 laps forwards/backwards with head turns up/down and side/side.  Then progressed to performing diagonals, however pt only able to complete forwards and backwards x 1 rep due to increased dizziness and slight nasuea, see pt instruction for details.  Ended session with gait without AD in order to address balance with additions of head turns as able.  Performed 115' without head turns at min/guard level.  Note good use of compensatory strategy when making turns (eyes, head, body).  Then performed 115' with head turns horizontally and vertically (half and half).  Note that with vertical head turns, pt with very limited ROM due to increasing dizziness and increasing assist needed  to ensure safety and prevent LOB.                        PT Education - 03/09/15 0803    Education provided Yes   Education Details additions to HEP, see pt instruction   Person(s) Educated Patient   Methods Explanation   Comprehension Verbalized understanding          PT Short Term Goals - 02/18/15 0830    PT SHORT TERM GOAL #1   Title Pt will perform initial HEP with mod I using paper handout to maximize progress made in PT. Target date: 02/04/15   Baseline Met 11/30.   Status  Achieved   PT SHORT TERM GOAL #2   Title During gait x150', pt will utilize compensatory strategies for dizziness/disequilibrium without cueing to increase pt safety with ambulation. Target date: 02/04/15   Baseline Met 11/30.   Status Achieved   PT SHORT TERM GOAL #3   Title Pt will ambulate x200' over level, indoor surfaces with effective obstacle negotiation with mod I using LRAD to indicate safety with household mobility. Target date: 02/04/15   Baseline Met 11/30.   Status Achieved   PT SHORT TERM GOAL #4   Title Pt will improve gait speed from 2.34 ft/sec to > 2.62 ft/sec to indicate status of community ambulatory. Target date: 02/04/15   Baseline Met 11/30. Gait velocity = 2.67 ft/sec   Status Achieved   PT SHORT TERM GOAL #5   Title Pt will negotiate 16 stairs with single rail with with no more than 2-point increase in subjective dizziness to increase pt tolerance to stair negotiation in home. Target date: 02/06/15   Baseline Met 12/16.   Status Achieved           PT Long Term Goals - 03/04/15 2423    PT LONG TERM GOAL #1   Title Pt will perform dynamic standing task for 10 consecutive minutes without seated rest break to indicate pt ability to unload dishwasher. Modified target date: 04/01/15   Baseline 12/30: Pt requested seated rest break after 5 minutes, 15 seconds due to dizziness/disequilibrium.  REVISED to remove subjective dizziness rating.   Status Revised   PT LONG TERM GOAL #2   Title Pt will decrease DHI score from 84 to </= 61 to indicate decreased pt-perceived disability due to dizziness.  Modified target date: 04/01/15   Baseline Continue goal through renewed POC.   Status On-going   PT LONG TERM GOAL #3   Title Pt will independently negotiate 16 stairs with single rail using reciprocal pattern to indicate increased stability/independence accessing second floor of home. Target date: 03/04/15   Baseline Met 12/16.   Status Achieved   PT LONG TERM GOAL #4   Title  Pt will ambulate 500' over unlevel, paved surfaces with mod I using LRAD to indicate increased safety with community mobility. Target date: 03/04/15   Baseline Met 12/30 using SPC and L Foot Up brace.   Status Achieved   PT LONG TERM GOAL #5   Title Pt will report ability to her walk dog for >/= 10 minutes without loss of balance to indicate safe return to leisure activities. Modified target date: 04/01/15   Baseline 12/30: Pt able to walk dog only to mail box due to disequilibrium with acceleration/deceleration.   REVISED to add >/= 10 minutes.  Continue goal through renewed POC.   Status Partially Met   Additional Long Term Goals  Additional Long Term Goals Yes   PT LONG TERM GOAL #6   Title Pt will improve DGI score from 9/24 to 14/24 to indicate decreased fall risk, increased dynamic gait stability. Target date: 04/01/15   Status New   PT LONG TERM GOAL #7   Title Pt will negotiate standard ramp and curb step with mod I using LRAD to indicate ability to safely traverse community obstacles. Target date: 04/01/15   Status New               Plan - 03/09/15 0803    Clinical Impression Statement Skilled session focused on addressing compliance and performance with single HEP exercise of knee to chest as she states this one is still very difficult.  Also worked on adding gait with head turns side/side, up/down and diagonals while walking along counter top for increased safety.  Note that side/side seems to be much improved, however vertical and diagonal motions are still very difficult.  See pt instruciton for full details.     Pt will benefit from skilled therapeutic intervention in order to improve on the following deficits Dizziness;Decreased balance;Decreased activity tolerance;Abnormal gait;Decreased coordination;Impaired sensation;Decreased strength;Impaired tone;Impaired vision/preception   Rehab Potential Good   PT Frequency 2x / week   PT Duration 4 weeks   PT  Treatment/Interventions ADLs/Self Care Home Management;Gait training;Stair training;Functional mobility training;Therapeutic activities;Vestibular;Visual/perceptual remediation/compensation;Neuromuscular re-education;Patient/family education;Therapeutic exercise;Balance training;DME Instruction;Orthotic Fit/Training;Energy conservation   PT Next Visit Plan Standing balance and dynamic gait activities   Consulted and Agree with Plan of Care Patient        Problem List Patient Active Problem List   Diagnosis Date Noted  . Multiple sclerosis ([place]) 10/26/2014  . Benign paroxysmal positional vertigo 10/12/2014  . 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 Urinary frequency 10/12/2014  . Other fatigue 10/12/2014  . Numbness 10/12/2014    Cameron Sprang, PT, MPT [facility] [address] [place], [place], [address] Phone: [phone]   Fax:  [phone] 03/09/2015, 8:48 AM  Name: Crystal Park MRN: [id number] Date of Birth: 11-26-1960

## 2015-03-09 NOTE — Patient Instructions (Addendum)
Tip Card  1.The goal of habituation training is to assist in decreasing symptoms of vertigo, dizziness, or nausea provoked by specific head and body motions. 2.These exercises may initially increase symptoms; however, be persistent and work through symptoms. With repetition and time, the exercises will assist in reducing or eliminating symptoms. 3.Exercises should be stopped and discussed with the therapist if you experience any of the following: - Sudden change or fluctuation in hearing - New onset of ringing in the ears, or increase in current intensity - Any fluid discharge from the ear - Severe pain in neck or back - Extreme nausea  Nose to Knee    Start in seated. Bring your nose to your LEFT knee (as pictured). Wait for symptoms to resolve plus 10 seconds; then return to seated, wait for symptoms to resolve plus 10 seconds. Then, bring your nose to your RIGHT knee. Wait for symptoms to resolve plus 10 seconds; then return to seated; wait for symptoms to resolve plus 10 seconds.   Perform this exercise 5 times per side. Do this twice per day.    Feet Together, Varied Arm Positions - Eyes Open - Arms by your side    Stand with back to corner with stable chair in front of you and with feet shoulder width apart and arms by your side. Look straight ahead at a stationary object. Hold __30__ seconds. Repeat __2__ times per session. Do __2__ sessions per day.   Feet Heel-Toe "Tandem", Varied Arm Positions - Eyes Open - Arms by your side    Stand with back to corner with stable chair in front of you and arms by your side. Try not to hold onto chair as you place the toes of your LEFT foot next to the heel of your RIGHT foot. Look straight ahead at a stationary object. Hold __30__ seconds. Switch foot position. Perform exercise one time with each foot in front. Do __2__ sessions per day.    Feet Apart (Compliant Surface) Varied Arm Positions - Eyes Closed    Stand with your back to a  corner with a stable chair in front of you. Stand on 1 pillow with feet shoulder width apart and arms by your side.   1. Close eyes and visualize upright position. Hold__30__ seconds. Repeat __4__ times per session. Do __2__ sessions per day.  2. While still standing on the pillow, open your eyes and turn your head from right to left 10 times, visually "spotting" object on the wall. Then turn head up to down ("nodding) 5 times while visually spotting object on wall  - For now, don't do gaze stabilization ("A" exercise) or sit to/from side lying habituation exercises.    Surface With Side to Side Head Motion    Perform while holding onto counter and using cane.  Walking forwards and then backwards along counter top, turn head side to side. Repeat x 3 reps.  Then perform x 3 reps with head turns up/down.   Perform in a slow steady pattern and to the range that you can tolerate without increasing symptoms more than 2-3 points during exercise. If you can tolerate, perform again x 1 rep performing head turns in diagonal pattern.   Do _1-2___ sessions per day.   Copyright  VHI. All rights reserved.

## 2015-03-11 ENCOUNTER — Ambulatory Visit: Payer: Managed Care, Other (non HMO) | Admitting: Rehabilitation

## 2015-03-11 ENCOUNTER — Encounter: Payer: Self-pay | Admitting: Rehabilitation

## 2015-03-11 DIAGNOSIS — R2681 Unsteadiness on feet: Secondary | ICD-10-CM

## 2015-03-11 DIAGNOSIS — R269 Unspecified abnormalities of gait and mobility: Secondary | ICD-10-CM | POA: Diagnosis not present

## 2015-03-11 DIAGNOSIS — R42 Dizziness and giddiness: Secondary | ICD-10-CM

## 2015-03-11 NOTE — Therapy (Addendum)
[facility] [address] [place], [place], [address] Phone: [phone]   Fax:  [phone]  Physical Therapy Treatment  Patient Details  Name: Crystal Park MRN: [id number] Date of Birth: [birth year] Referring Provider: Arlice Colt, MD  Encounter Date: 03/11/2015      PT End of Session - 03/11/15 0856    Visit Number 17   Number of Visits 23  requesting additional 2x/week for 4 weeks   Date for PT Re-Evaluation 04/03/15   Authorization Type [employer] - 20 visit limit for PT   Authorization - Visit Number 1   Authorization - Number of Visits 20   PT Start Time [phone]  pt late to appt   PT Stop Time 0930   PT Time Calculation (min) 38 min   Equipment Utilized During Treatment Gait belt   Activity Tolerance Other (comment)  Frequent rest breaks required due to dizziness/disequilibrium   Behavior During Therapy [facility] for tasks assessed/performed      Past Medical History  Diagnosis Date  . Benign paroxysmal positional vertigo 10/12/2014    History reviewed. No pertinent past surgical history.  There were no vitals filed for this visit.  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 No/denies           NMR: Performed corner balance tasks in order to continue to challenge vestibular system; standing  on stacked pillows, EO with head turns side/side x 15 reps, up/down x 15 reps and diagonals in each direction x 5 reps due to increased dizziness.  Pt with seated rest break needed due to increased dizziness/nausea with diagonal movements.  Ended corner tasks with standing on stacked pillows, EC with head turns side/side x 10 reps and up/down x 5 reps.  Pt needing to stop again due to increased dizziness.  Transitioned to cone tapping task on compliant mat for increased balance challenge.  Performed alternating LEs with side stepping while tapping cones.  Note pt able to compensate by using peripheral vision rather than looking down to cone.  Progressed to tipping cone over and back up for increased challenge and also to attempt to have pt look down for target.  Pt still able to compensate most of time but did intermittently look down.                        PT Education - 03/11/15 0856    Education provided Yes   Education Details education on vestibular deficits   Person(s) Educated Patient   Methods Explanation   Comprehension Verbalized understanding          PT Short Term Goals - 02/18/15 0830    PT SHORT TERM GOAL #1   Title Pt will perform initial HEP with mod I using paper handout to maximize progress made in PT. Target  date: 02/04/15   Baseline Met 11/30.   Status Achieved   PT SHORT TERM GOAL #2   Title During gait x150', pt will utilize compensatory strategies for dizziness/disequilibrium without cueing to increase pt safety with ambulation. Target date: 02/04/15   Baseline Met 11/30.   Status Achieved   PT SHORT TERM GOAL #3   Title Pt will ambulate x200' over level, indoor surfaces with effective obstacle negotiation with mod I using LRAD to indicate safety with household mobility. Target date: 02/04/15   Baseline Met 11/30.   Status Achieved   PT SHORT TERM GOAL #4   Title Pt will improve gait speed from 2.34 ft/sec to > 2.62 ft/sec to indicate status of  community ambulatory. Target date: 02/04/15   Baseline Met 11/30. Gait velocity = 2.67 ft/sec   Status Achieved   PT SHORT TERM GOAL #5   Title Pt will negotiate 16 stairs with single rail with with no more than 2-point increase in subjective dizziness to increase pt tolerance to stair negotiation in home. Target date: 02/06/15   Baseline Met 12/16.   Status Achieved           PT Long Term Goals - 03/04/15 9892    PT LONG TERM GOAL #1   Title Pt will perform dynamic standing task for 10 consecutive minutes without seated rest break to indicate pt ability to unload dishwasher. Modified target date: 04/01/15   Baseline 12/30: Pt requested seated rest break after 5 minutes, 15 seconds due to dizziness/disequilibrium.  REVISED to remove subjective dizziness rating.   Status Revised   PT LONG TERM GOAL #2   Title Pt will decrease DHI score from 84 to </= 61 to indicate decreased pt-perceived disability due to dizziness.  Modified target date: 04/01/15   Baseline Continue goal through renewed POC.   Status On-going   PT LONG TERM GOAL #3   Title Pt will independently negotiate 16 stairs with single rail using reciprocal pattern to indicate increased stability/independence accessing second floor of home. Target date: 03/04/15   Baseline Met 12/16.   Status Achieved   PT LONG TERM GOAL #4   Title Pt will ambulate 500' over unlevel, paved surfaces with mod I using LRAD to indicate increased safety with community mobility. Target date: 03/04/15   Baseline Met 12/30 using SPC and L Foot Up brace.   Status Achieved   PT LONG TERM GOAL #5   Title Pt will report ability to her walk dog for >/= 10 minutes without loss of balance to indicate safe return to leisure activities. Modified target date: 04/01/15   Baseline 12/30: Pt able to walk dog only to mail box due to disequilibrium with acceleration/deceleration.   REVISED to add >/= 10 minutes.  Continue goal through renewed POC.   Status Partially Met    Additional Long Term Goals   Additional Long Term Goals Yes   PT LONG TERM GOAL #6   Title Pt will improve DGI score from 9/24 to 14/24 to indicate decreased fall risk, increased dynamic gait stability. Target date: 04/01/15   Status New   PT LONG TERM GOAL #7   Title Pt will negotiate standard ramp and curb step with mod I using LRAD to indicate ability to safely traverse community obstacles. Target date: 04/01/15   Status New               Plan - 03/11/15 0856    Clinical Impression Statement Skilled session focused on performing high  level balance and vestibular adaptation exercises.  Pt with increased dizziness today due to feeling like she has a cold and is "stopped up" on R side of head.     Pt will benefit from skilled therapeutic intervention in order to improve on the following deficits Dizziness;Decreased balance;Decreased activity tolerance;Abnormal gait;Decreased coordination;Impaired sensation;Decreased strength;Impaired tone;Impaired vision/preception   Rehab Potential Good   PT Frequency 2x / week   PT Duration 4 weeks   PT Treatment/Interventions ADLs/Self Care Home Management;Gait training;Stair training;Functional mobility training;Therapeutic activities;Vestibular;Visual/perceptual remediation/compensation;Neuromuscular re-education;Patient/family education;Therapeutic exercise;Balance training;DME Instruction;Orthotic Fit/Training;Energy conservation   PT Next Visit Plan Standing balance and dynamic gait activities   Consulted and Agree with Plan of Care Patient        Problem List Patient Active Problem List   Diagnosis Date Noted  . Multiple sclerosis ([place]) 10/26/2014  . Benign paroxysmal positional vertigo 10/12/2014  . 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 Urinary frequency 10/12/2014  . Other fatigue 10/12/2014  . Numbness 10/12/2014    Cameron Sprang, PT, MPT [facility] [address] [place], [place],  [address] Phone: [phone]   Fax:  [phone] 03/11/2015, 9:34 AM  Name: Crystal Park MRN: [id number] Date of Birth: Oct 14, 1960

## 2015-03-16 ENCOUNTER — Ambulatory Visit: Payer: Managed Care, Other (non HMO) | Admitting: Physical Therapy

## 2015-03-16 DIAGNOSIS — R2681 Unsteadiness on feet: Secondary | ICD-10-CM

## 2015-03-16 DIAGNOSIS — R269 Unspecified abnormalities of gait and mobility: Secondary | ICD-10-CM | POA: Diagnosis not present

## 2015-03-16 DIAGNOSIS — R531 Weakness: Secondary | ICD-10-CM

## 2015-03-16 DIAGNOSIS — R42 Dizziness and giddiness: Secondary | ICD-10-CM

## 2015-03-16 NOTE — Therapy (Signed)
[facility] [address] [place], [place], [address] Phone: [phone]   Fax:  [phone]  Physical Therapy Treatment  Patient Details  Name: TANAKA GILLEN MRN: [id number] Date of Birth: Aug 31, 1960 Referring Provider: Arlice Colt, MD  Encounter Date: 03/16/2015      PT End of Session - 03/16/15 1958    Visit Number 18   Number of Visits 23   Date for PT Re-Evaluation 04/03/15   Authorization Type [employer] - 20 visit limit for PT   Authorization - Visit Number 3   Authorization - Number of Visits 20   PT Start Time 0805   PT Stop Time 2694   PT Time Calculation (min) 42 min   Equipment Utilized During Treatment Gait belt   Activity Tolerance Other (comment)  frequent rest breaks required due to dizziness/disequilibrium   Behavior During Therapy [facility] for tasks assessed/performed      Past Medical History  Diagnosis Date  . Benign paroxysmal positional vertigo 10/12/2014    No past surgical history on file.  There were no vitals filed for this visit.  Visit Diagnosis:  Abnormality of gait  Dizziness and giddiness  Unsteadiness  Weakness generalized      Subjective Assessment - 03/16/15 0807    Subjective Pt reports no falls. States head cold is improving. 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 No/denies                         [facility] Adult PT Treatment/Exercise - 03/16/15  0001    Ambulation/Gait   Ambulation/Gait Yes   Ambulation/Gait Assistance 5: Supervision;6: Modified independent (Device/Increase time)   Ambulation/Gait Assistance Details Supervision for gait with functional head turns and obstacle negotiation.   Ambulation Distance (Feet) 440 Feet   Assistive device Straight cane;None;Other (Comment)  L FootUp brace   Gait Pattern Step-through pattern;Decreased stride length;Decreased weight shift to left;Left foot flat  BOS more normalized as compared with previous sessions   Ambulation Surface Level;Indoor   Exercises   Exercises Other Exercises   Other Exercises  Between habituation and other vestibular exercises: bridging with red Tband around B thighs (for increased hip abductor activation) x13 reps then x10 reps; transverse abdominus activation with concurrent LE marching 2 x2 reps then x12 reps; clamshells x11 reps (to pt fatigue) on L, x7 reps on R         Vestibular Treatment/Exercise - 03/16/15 0001    Vestibular Treatment/Exercise   Vestibular Treatment Provided Habituation   Habituation Exercises Laruth Bouchard Daroff;Horizontal Roll;Standing Vertical Head Turns   Nestor Lewandowsky   Number of Reps  2   Horizontal Roll   Number of Reps  3   Symptom Description  Initially required increased time for dizziness to return to baseline; "doesn't bother me a lot" during final rep            Balance Exercises - 03/16/15 0844    Balance  Exercises: Standing   Retro Gait --   Turning --             PT Short Term Goals - 02/18/15 0830    PT SHORT TERM GOAL #1   Title Pt will perform initial HEP with mod I using paper handout to maximize progress made in PT. Target date: 02/04/15   Baseline Met 11/30.   Status Achieved   PT SHORT TERM GOAL #2   Title During gait x150', pt will utilize compensatory strategies for dizziness/disequilibrium without cueing to increase pt safety with ambulation. Target date: 02/04/15   Baseline Met 11/30.    Status Achieved   PT SHORT TERM GOAL #3   Title Pt will ambulate x200' over level, indoor surfaces with effective obstacle negotiation with mod I using LRAD to indicate safety with household mobility. Target date: 02/04/15   Baseline Met 11/30.   Status Achieved   PT SHORT TERM GOAL #4   Title Pt will improve gait speed from 2.34 ft/sec to > 2.62 ft/sec to indicate status of community ambulatory. Target date: 02/04/15   Baseline Met 11/30. Gait velocity = 2.67 ft/sec   Status Achieved   PT SHORT TERM GOAL #5   Title Pt will negotiate 16 stairs with single rail with with no more than 2-point increase in subjective dizziness to increase pt tolerance to stair negotiation in home. Target date: 02/06/15   Baseline Met 12/16.   Status Achieved           PT Long Term Goals - 03/16/15 2004    PT LONG TERM GOAL #1   Title Pt will perform dynamic standing task for 10 consecutive minutes without seated rest break to indicate pt ability to unload dishwasher. Modified target date: 04/01/15   Baseline 12/30: Pt requested seated rest break after 5 minutes, 15 seconds due to dizziness/disequilibrium.  REVISED to remove subjective dizziness rating.   Status On-going   PT LONG TERM GOAL #2   Title Pt will decrease DHI score from 84 to </= 61 to indicate decreased pt-perceived disability due to dizziness.  Modified target date: 04/01/15   Baseline Continue goal through renewed POC.   Status On-going   PT LONG TERM GOAL #3   Title Pt will independently negotiate 16 stairs with single rail using reciprocal pattern to indicate increased stability/independence accessing second floor of home. Target date: 03/04/15   Baseline Met 12/16.   Status Achieved   PT LONG TERM GOAL #4   Title Pt will ambulate 500' over unlevel, paved surfaces with mod I using LRAD to indicate increased safety with community mobility. Target date: 03/04/15   Baseline Met 12/30 using SPC and L Foot Up brace.   Status Achieved   PT LONG  TERM GOAL #5   Title Pt will report ability to her walk dog for >/= 10 minutes without loss of balance to indicate safe return to leisure activities. Modified target date: 04/01/15   Baseline 12/30: Pt able to walk dog only to mail box due to disequilibrium with acceleration/deceleration.   REVISED to add >/= 10 minutes.  Continue goal through renewed POC.   Status On-going   PT LONG TERM GOAL #6   Title Pt will improve DGI score from 9/24 to 14/24 to indicate decreased fall risk, increased dynamic gait stability. Target date: 04/01/15   Status On-going   PT LONG TERM GOAL #7   Title Pt will negotiate standard ramp and curb step with mod I using LRAD  to indicate ability to safely traverse community obstacles. Target date: 04/01/15   Status On-going               Plan - 03/16/15 2000    Clinical Impression Statement Session focused on increasing pt tolerance to/stability with dynamic gait activities as well and functional LE strengthening. Pt tolerance to functional head movement limited due to recent cold; therefore, pt utilized recovery periods (due to disequilibrium) as opporunity for LE strengthening.   Pt will benefit from skilled therapeutic intervention in order to improve on the following deficits Dizziness;Decreased balance;Decreased activity tolerance;Abnormal gait;Decreased coordination;Impaired sensation;Decreased strength;Impaired tone;Impaired vision/preception   Rehab Potential Good   PT Frequency 2x / week   PT Duration 4 weeks   PT Treatment/Interventions ADLs/Self Care Home Management;Gait training;Stair training;Functional mobility training;Therapeutic activities;Vestibular;Visual/perceptual remediation/compensation;Neuromuscular re-education;Patient/family education;Therapeutic exercise;Balance training;DME Instruction;Orthotic Fit/Training;Energy conservation   PT Next Visit Plan Standing balance and dynamic gait activities; habituation with LE strengthening during  recovery periods. Retro gait. Consider trying head turns while standing on ramp   Consulted and Agree with Plan of Care Patient        Problem List Patient Active Problem List   Diagnosis Date Noted  . Multiple sclerosis ([place]) 10/26/2014  . Benign paroxysmal positional vertigo 10/12/2014  . 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 Urinary frequency 10/12/2014  . Other fatigue 10/12/2014  . Numbness 10/12/2014    Billie Ruddy, PT, DPT [facility] [address] [place], [place], [address] Phone: [phone]   Fax:  [phone] 03/16/2015, 8:05 PM   Name: SHAVANA CALDER MRN: [id number] Date of Birth: April 11, 1960

## 2015-03-18 ENCOUNTER — Ambulatory Visit: Payer: Managed Care, Other (non HMO) | Admitting: Physical Therapy

## 2015-03-18 DIAGNOSIS — R2681 Unsteadiness on feet: Secondary | ICD-10-CM

## 2015-03-18 DIAGNOSIS — R42 Dizziness and giddiness: Secondary | ICD-10-CM

## 2015-03-18 DIAGNOSIS — R269 Unspecified abnormalities of gait and mobility: Secondary | ICD-10-CM | POA: Diagnosis not present

## 2015-03-19 NOTE — Therapy (Signed)
[facility] [address] [place], [place], [address] Phone: [phone]   Fax:  [phone]  Physical Therapy Treatment  Patient Details  Name: Crystal Park MRN: [id number] Date of Birth: Jun 27, 1960 Referring Provider: Arlice Colt, MD  Encounter Date: 03/18/2015      PT End of Session - 03/19/15 1533    Visit Number 19   Number of Visits 23   Authorization Type [employer] - 20 visit limit for PT   Authorization - Visit Number 4   Authorization - Number of Visits 20   PT Start Time 0803   PT Stop Time 0846   PT Time Calculation (min) 43 min   Equipment Utilized During Treatment Gait belt;Other (comment)  Balance Master and harness   Activity Tolerance Other (comment)  frequent rest breaks required due to disequilibrium   Behavior During Therapy [facility] for tasks assessed/performed      Past Medical History  Diagnosis Date  . Benign paroxysmal positional vertigo 10/12/2014    No past surgical history on file.  There were no vitals filed for this visit.  Visit Diagnosis:  Dizziness and giddiness  Unsteadiness  Abnormality of gait      Subjective Assessment - 03/18/15 0804    Subjective Pt reports dizziness is "about the same" and cold is improving. Home exercise program going well, but seated nose-to-knee habituation exercise is "rough", per pt.   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 No/denies                              Balance Exercises - 03/18/15 0846    Balance Exercises: Standing   Other Standing Exercises --             PT Short Term Goals - 02/18/15 0830    PT SHORT TERM GOAL #1   Title Pt will perform initial HEP with mod I using paper handout to maximize progress made in PT. Target date: 02/04/15   Baseline Met 11/30.   Status Achieved   PT SHORT TERM GOAL #2   Title During gait x150', pt will utilize compensatory strategies for dizziness/disequilibrium without cueing to increase pt safety with ambulation. Target date: 02/04/15   Baseline Met 11/30.   Status Achieved   PT SHORT TERM GOAL #3   Title Pt will ambulate x200' over level, indoor surfaces with effective obstacle negotiation with mod I using LRAD to indicate safety with household mobility. Target date: 02/04/15   Baseline Met 11/30.   Status Achieved   PT SHORT TERM GOAL #4   Title Pt will improve gait speed from 2.34 ft/sec to > 2.62 ft/sec to indicate status of community ambulatory. Target date: 02/04/15   Baseline Met 11/30. Gait velocity = 2.67 ft/sec   Status Achieved   PT SHORT TERM GOAL #5   Title Pt will negotiate 16 stairs with single rail with with no more than 2-point increase in subjective dizziness to increase pt tolerance to stair negotiation in home. Target date: 02/06/15  Baseline Met 12/16.   Status Achieved           PT Long Term Goals - 03/16/15 2004    PT LONG TERM GOAL #1   Title Pt will perform dynamic standing task for 10 consecutive minutes without seated rest break to indicate pt ability to unload dishwasher. Modified target date: 04/01/15   Baseline 12/30: Pt requested seated rest break after 5 minutes, 15 seconds due to dizziness/disequilibrium.  REVISED to remove subjective dizziness rating.   Status On-going   PT LONG TERM GOAL #2   Title Pt will decrease DHI score from 84 to </= 61 to indicate decreased pt-perceived disability due to dizziness.  Modified target date: 04/01/15   Baseline Continue goal through renewed POC.   Status  On-going   PT LONG TERM GOAL #3   Title Pt will independently negotiate 16 stairs with single rail using reciprocal pattern to indicate increased stability/independence accessing second floor of home. Target date: 03/04/15   Baseline Met 12/16.   Status Achieved   PT LONG TERM GOAL #4   Title Pt will ambulate 500' over unlevel, paved surfaces with mod I using LRAD to indicate increased safety with community mobility. Target date: 03/04/15   Baseline Met 12/30 using SPC and L Foot Up brace.   Status Achieved   PT LONG TERM GOAL #5   Title Pt will report ability to her walk dog for >/= 10 minutes without loss of balance to indicate safe return to leisure activities. Modified target date: 04/01/15   Baseline 12/30: Pt able to walk dog only to mail box due to disequilibrium with acceleration/deceleration.   REVISED to add >/= 10 minutes.  Continue goal through renewed POC.   Status On-going   PT LONG TERM GOAL #6   Title Pt will improve DGI score from 9/24 to 14/24 to indicate decreased fall risk, increased dynamic gait stability. Target date: 04/01/15   Status On-going   PT LONG TERM GOAL #7   Title Pt will negotiate standard ramp and curb step with mod I using LRAD to indicate ability to safely traverse community obstacles. Target date: 04/01/15   Status On-going               Plan - 03/19/15 1534    Clinical Impression Statement Session focused on increasing pt use of visual input to substitute for vestibular system during dynamic standing tasks. Noted improved midline orientation during Balance Master activities. Pt continues to require frequent rest breaks due to disequilibrium. Encouraged active rest breaks during recovery periods, which pt  appeared to tolerate well. Dizziness decreased from 4/10 to 3/10 within-session.   Pt will benefit from skilled therapeutic intervention in order to improve on the following deficits Dizziness;Decreased balance;Decreased activity tolerance;Abnormal  gait;Decreased coordination;Impaired sensation;Decreased strength;Impaired tone;Impaired vision/preception   Rehab Potential Good   PT Frequency 2x / week   PT Duration 4 weeks   PT Treatment/Interventions ADLs/Self Care Home Management;Gait training;Stair training;Functional mobility training;Therapeutic activities;Vestibular;Visual/perceptual remediation/compensation;Neuromuscular re-education;Patient/family education;Therapeutic exercise;Balance training;DME Instruction;Orthotic Fit/Training;Energy conservation   PT Next Visit Plan Continue to progress standing balance and dynamic gait activities; habituation with LE strengthening during recovery periods. Retro gait. Consider trying head turns while standing on ramp   Consulted and Agree with Plan of Care Patient        Problem List Patient Active Problem List   Diagnosis Date Noted  . Multiple sclerosis ([place]) 10/26/2014  . Benign paroxysmal positional vertigo 10/12/2014  . 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  Urinary frequency 10/12/2014  . Other fatigue 10/12/2014  . Numbness 10/12/2014   Billie Ruddy, PT, DPT [facility] [address] [place], [place], [address] Phone: [phone]   Fax:  [phone] 03/19/2015, 3:38 PM  Name: Crystal Park MRN: [id number] Date of Birth: [birth year]

## 2015-03-23 ENCOUNTER — Ambulatory Visit: Payer: Managed Care, Other (non HMO) | Admitting: Physical Therapy

## 2015-03-23 DIAGNOSIS — R2681 Unsteadiness on feet: Secondary | ICD-10-CM

## 2015-03-23 DIAGNOSIS — R531 Weakness: Secondary | ICD-10-CM

## 2015-03-23 DIAGNOSIS — R42 Dizziness and giddiness: Secondary | ICD-10-CM

## 2015-03-23 DIAGNOSIS — R269 Unspecified abnormalities of gait and mobility: Secondary | ICD-10-CM | POA: Diagnosis not present

## 2015-03-23 NOTE — Therapy (Signed)
[facility] [address] [place], [place], [address] Phone: [phone]   Fax:  [phone]  Physical Therapy Treatment  Patient Details  Name: Crystal Park MRN: [id number] Date of Birth: 1960-11-07 Referring Provider: Arlice Colt, MD  Encounter Date: 03/23/2015      PT End of Session - 03/23/15 0808    Visit Number 20   Number of Visits 23   Date for PT Re-Evaluation 04/03/15   Authorization Type [employer] - 20 visit limit for PT   Authorization - Visit Number 5   Authorization - Number of Visits 20   PT Start Time 0802   PT Stop Time 0848   PT Time Calculation (min) 46 min   Equipment Utilized During Treatment Gait belt   Activity Tolerance Other (comment)  intermittent rest breaks required due to disequilibrium   Behavior During Therapy [facility] for tasks assessed/performed      Past Medical History  Diagnosis Date  . Benign paroxysmal positional vertigo 10/12/2014    No past surgical history on file.  There were no vitals filed for this visit.  Visit Diagnosis:  Dizziness and giddiness  Unsteadiness  Abnormality of gait  Weakness generalized      Subjective Assessment - 03/23/15 0805    Subjective Pt reports cold is getting better.  No falls, no significant changes. Pt reports dizziness is "not that much better," per pt. 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 No/denies                          [facility] Adult PT Treatment/Exercise - 03/23/15 0001    Ambulation/Gait   Ambulation/Gait Yes   Ambulation/Gait Assistance 5: Supervision   Ambulation/Gait Assistance Details Gait performed as active rest break/recovery between dynamic standing balance/gait trials due to increased dizziness/disequilibrium.   Ambulation Distance (Feet) 1010 Feet  x800' outdoors; x210 indoors   Assistive device Straight cane;None;Other (Comment)  L FootUp brace   Gait Pattern Step-through pattern;Decreased stride length;Decreased weight shift to right  BOS more normalized as compared with previous sessions   Ambulation Surface Level;Unlevel;Indoor;Outdoor;Paved   Ramp 5: Supervision   Ramp Details (indicate cue type and reason) with SPC   Curb 5: Supervision   Curb Details (indicate cue type and reason) with SPC   Gait Comments Noted improved lateral weight shift to R side, improved L hip/knee flexion during LLE advancement during gait after pt performed challenging dynamic standing/gait task, whereas pt demonstrated limited L hip/knee extension when dizzy during previous sessions.   Neuro Re-ed    Neuro Re-ed Details  --   Exercises   Exercises Other Exercises   Other Exercises  Between balance exercises (while pt recovering from increased dizziness/disequilibrium), pt performed the following exercises: supine bridging with red Tband around B thighs (for increased hip ABD activation) x16 reps then x10 reps (to pt fatigue). Supine, hook lying, pt perfomred chin tucks x10 reps (3-sec holds) with cueing from PT for proper technique.  Balance Exercises - 03/23/15 0840    Balance Exercises: Standing   Step Ups Forward;Lateral;4 inch;6 inch;Intermittent UE support   Retro Gait Other (comment)  x50' without AD   Sidestepping 2 reps   Turning Both;10 reps   Step Over Hurdles / Cones Stepping over obstacles (3", 4", and  6") with interittent UE support at parallel bars x3 trials forward-facing,  x2 trials laterally with RLE leading, x2 trials laterally with LLE leading.             PT Short Term Goals - 02/18/15 0830    PT SHORT TERM GOAL #1   Title Pt will perform initial HEP with mod I using paper handout to maximize progress made in PT. Target date: 02/04/15   Baseline Met 11/30.   Status Achieved   PT SHORT TERM GOAL #2   Title During gait x150', pt will utilize compensatory strategies for dizziness/disequilibrium without cueing to increase pt safety with ambulation. Target date: 02/04/15   Baseline Met 11/30.   Status Achieved   PT SHORT TERM GOAL #3   Title Pt will ambulate x200' over level, indoor surfaces with effective obstacle negotiation with mod I using LRAD to indicate safety with household mobility. Target date: 02/04/15   Baseline Met 11/30.   Status Achieved   PT SHORT TERM GOAL #4   Title Pt will improve gait speed from 2.34 ft/sec to > 2.62 ft/sec to indicate status of community ambulatory. Target date: 02/04/15   Baseline Met 11/30. Gait velocity = 2.67 ft/sec   Status Achieved   PT SHORT TERM GOAL #5   Title Pt will negotiate 16 stairs with single rail with with no more than 2-point increase in subjective dizziness to increase pt tolerance to stair negotiation in home. Target date: 02/06/15   Baseline Met 12/16.   Status Achieved           PT Long Term Goals - 03/16/15 2004    PT LONG TERM GOAL #1   Title Pt will perform dynamic standing task for 10 consecutive minutes without seated rest break to indicate pt ability to unload dishwasher. Modified target date: 04/01/15   Baseline 12/30: Pt requested seated rest break after 5 minutes, 15 seconds due to dizziness/disequilibrium.  REVISED to remove subjective dizziness rating.   Status On-going   PT LONG TERM GOAL #2   Title Pt will decrease DHI score from 84 to </= 61 to indicate decreased pt-perceived disability due to dizziness.  Modified target date: 04/01/15   Baseline Continue goal through renewed  POC.   Status On-going   PT LONG TERM GOAL #3   Title Pt will independently negotiate 16 stairs with single rail using reciprocal pattern to indicate increased stability/independence accessing second floor of home. Target date: 03/04/15   Baseline Met 12/16.   Status Achieved   PT LONG TERM GOAL #4   Title Pt will ambulate 500' over unlevel, paved surfaces with mod I using LRAD to indicate increased safety with community mobility. Target date: 03/04/15   Baseline Met 12/30 using SPC and L Foot Up brace.   Status Achieved   PT LONG TERM GOAL #5   Title Pt will report ability to her walk dog for >/= 10 minutes without loss of balance to indicate safe return to leisure activities. Modified target date: 04/01/15   Baseline 12/30: Pt able to walk dog only to mail box due to disequilibrium with acceleration/deceleration.   REVISED to add >/= 10 minutes.  Continue  goal through renewed POC.   Status On-going   PT LONG TERM GOAL #6   Title Pt will improve DGI score from 9/24 to 14/24 to indicate decreased fall risk, increased dynamic gait stability. Target date: 04/01/15   Status On-going   PT LONG TERM GOAL #7   Title Pt will negotiate standard ramp and curb step with mod I using LRAD to indicate ability to safely traverse community obstacles. Target date: 04/01/15   Status On-going               Plan - 03/23/15 0859    Clinical Impression Statement Session focused on dynamic gait with emphasis on obstalce negotiation; also addressed functional LE strengthening and gait training during active rest breaks. Noted improved gait stability during active rests, as indicated by most consistent lateral weight shift to R side, improved L hpi/knee flexion during LLE advancement, and consistent LLE clearance (no L toe catches, no overt LOB during gait throughout this session).    Pt will benefit from skilled therapeutic intervention in order to improve on the following deficits Dizziness;Decreased  balance;Decreased activity tolerance;Abnormal gait;Decreased coordination;Impaired sensation;Decreased strength;Impaired tone;Impaired vision/preception   Rehab Potential Good   PT Frequency 2x / week   PT Duration 4 weeks   PT Treatment/Interventions ADLs/Self Care Home Management;Gait training;Stair training;Functional mobility training;Therapeutic activities;Vestibular;Visual/perceptual remediation/compensation;Neuromuscular re-education;Patient/family education;Therapeutic exercise;Balance training;DME Instruction;Orthotic Fit/Training;Energy conservation   PT Next Visit Plan Provide pt with HEP to continue post-DC from PT. Continue to progress standing balance and dynamic gait activities; habituation with LE strengthening during recovery periods.    Consulted and Agree with Plan of Care Patient        Problem List Patient Active Problem List   Diagnosis Date Noted  . Multiple sclerosis ([place]) 10/26/2014  . Benign paroxysmal positional vertigo 10/12/2014  . 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 Urinary frequency 10/12/2014  . Other fatigue 10/12/2014  . Numbness 10/12/2014    Billie Ruddy, PT, DPT [facility] [address] [place], [place], [address] Phone: [phone]   Fax:  [phone] 03/23/2015, 9:05 AM   Name: EMYLIE AMSTER MRN: [id number] Date of Birth: 1960-05-06

## 2015-03-25 ENCOUNTER — Ambulatory Visit: Payer: Managed Care, Other (non HMO) | Admitting: Physical Therapy

## 2015-03-25 DIAGNOSIS — R42 Dizziness and giddiness: Secondary | ICD-10-CM

## 2015-03-25 DIAGNOSIS — R2681 Unsteadiness on feet: Secondary | ICD-10-CM

## 2015-03-25 DIAGNOSIS — R269 Unspecified abnormalities of gait and mobility: Secondary | ICD-10-CM

## 2015-03-25 NOTE — Therapy (Signed)
[facility] [address] [place], [place], [address] Phone: [phone]   Fax:  [phone]  Physical Therapy Treatment  Patient Details  Name: Crystal Park MRN: [id number] Date of Birth: May 28, 1960 Referring Provider: Arlice Colt, MD  Encounter Date: 03/25/2015      PT End of Session - 03/25/15 0957    Visit Number 21   Number of Visits 23   Date for PT Re-Evaluation 04/03/15   Authorization Type [employer] - 20 visit limit for PT   Authorization - Visit Number 6   Authorization - Number of Visits 20   PT Start Time 0801   PT Stop Time 7673   PT Time Calculation (min) 46 min   Equipment Utilized During Treatment Gait belt   Activity Tolerance Other (comment)  intermittent rest breaks due to disequilbrium   Behavior During Therapy [facility] for tasks assessed/performed      Past Medical History  Diagnosis Date  . Benign paroxysmal positional vertigo 10/12/2014    No past surgical history on file.  There were no vitals filed for this visit.  Visit Diagnosis:  Dizziness and giddiness  Unsteadiness  Abnormality of gait      Subjective Assessment - 03/25/15 0801    Subjective Pt reports feeling "a little more dizzy today; but I'm not sure why." When asked about current HEP, pt reports that she needs to sit still after habituation exercises (nose to knee) "for at least 20 minutes," per pt.   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 No/denies                         [facility] Adult  PT Treatment/Exercise - 03/25/15 0001    Ambulation/Gait   Ambulation/Gait Yes   Ambulation/Gait Assistance 5: Supervision   Ambulation Distance (Feet) 400 Feet   Assistive device Straight cane;None;Other (Comment)  L FootUp brace   Gait Pattern Step-through pattern;Decreased stride length;Decreased weight shift to right;Narrow base of support  BOS more normalized as compared with previous sessions   Ambulation Surface Level;Indoor         Vestibular Treatment/Exercise - 03/25/15 0001    Vestibular Treatment/Exercise   Vestibular Treatment Provided Gaze   Gaze Exercises X1 Viewing Horizontal;X1 Viewing Vertical   X1 Viewing Horizontal   Foot Position seated   Comments x30 seconds with no increase in symptoms.   X1 Viewing Vertical   Foot Position seated   Comments x30 seconds with brief increase in symptoms from 4/10 to 5/10   Eye/Head Exercise Horizontal   Foot Position --   Comments --            Balance Exercises - 03/25/15 0852    Balance Exercises: Standing   Standing Eyes Opened Narrow base of support (BOS);Foam/compliant surface;2 reps;30 secs;Wide (BOA);Head turns  1 pillow; narrow BOS w/o head turns; wide BOS w/ head turns   Standing Eyes Closed Wide (BOA);Foam/compliant surface;2 reps;30 secs  1 pillow   Tandem Stance Eyes open;2 reps;30 secs  semi tandem due to posterior LOB  in tandem   Other Standing Exercises Pt engaged in standing task involving removing clothes pins from small beam and placing pins in basket, then replacing pins back on beam to assess/address tolerance to functional standing task, use of visual input to substitute for central vestibular impairments. Pt initially able to perform standing task for 2.5 minutes prior to requiring seated rest break. With cueing/education on decreasing velocity of task performance, visually "spotting" clothes pins as she received/placed pins, and performing intermittent self-assessment of symptoms to ensure symptoms  weren't escalating, pt able to perform standing task for 5.5 minutes consecutively prior to requiring seated rest break.           PT Education - 03/25/15 0849    Education provided Yes   Education Details Progressed/modified HEP; see Pt Instructions.   Person(s) Educated Patient   Methods Explanation;Demonstration;Handout   Comprehension Verbalized understanding;Returned demonstration          PT Short Term Goals - 02/18/15 0830    PT SHORT TERM GOAL #1   Title Pt will perform initial HEP with mod I using paper handout to maximize progress made in PT. Target date: 02/04/15   Baseline Met 11/30.   Status Achieved   PT SHORT TERM GOAL #2   Title During gait x150', pt will utilize compensatory strategies for dizziness/disequilibrium without cueing to increase pt safety with ambulation. Target date: 02/04/15   Baseline Met 11/30.   Status Achieved   PT SHORT TERM GOAL #3   Title Pt will ambulate x200' over level, indoor surfaces with effective obstacle negotiation with mod I using LRAD to indicate safety with household mobility. Target date: 02/04/15   Baseline Met 11/30.   Status Achieved   PT SHORT TERM GOAL #4   Title Pt will improve gait speed from 2.34 ft/sec to > 2.62 ft/sec to indicate status of community ambulatory. Target date: 02/04/15   Baseline Met 11/30. Gait velocity = 2.67 ft/sec   Status Achieved   PT SHORT TERM GOAL #5   Title Pt will negotiate 16 stairs with single rail with with no more than 2-point increase in subjective dizziness to increase pt tolerance to stair negotiation in home. Target date: 02/06/15   Baseline Met 12/16.   Status Achieved           PT Long Term Goals - 03/16/15 2004    PT LONG TERM GOAL #1   Title Pt will perform dynamic standing task for 10 consecutive minutes without seated rest break to indicate pt ability to unload dishwasher. Modified target date: 04/01/15   Baseline 12/30: Pt requested seated rest break after 5 minutes, 15  seconds due to dizziness/disequilibrium.  REVISED to remove subjective dizziness rating.   Status On-going   PT LONG TERM GOAL #2   Title Pt will decrease DHI score from 84 to </= 61 to indicate decreased pt-perceived disability due to dizziness.  Modified target date: 04/01/15   Baseline Continue goal through renewed POC.   Status On-going   PT LONG TERM GOAL #3   Title Pt will independently negotiate 16 stairs with single rail using reciprocal pattern to indicate increased stability/independence accessing second floor of home. Target date: 03/04/15   Baseline Met 12/16.   Status Achieved   PT LONG TERM GOAL #4   Title Pt will ambulate 500' over unlevel, paved surfaces with mod I using LRAD to indicate increased safety with community mobility. Target date: 03/04/15   Baseline Met 12/30 using SPC and L Foot  Up brace.   Status Achieved   PT LONG TERM GOAL #5   Title Pt will report ability to her walk dog for >/= 10 minutes without loss of balance to indicate safe return to leisure activities. Modified target date: 04/01/15   Baseline 12/30: Pt able to walk dog only to mail box due to disequilibrium with acceleration/deceleration.   REVISED to add >/= 10 minutes.  Continue goal through renewed POC.   Status On-going   PT LONG TERM GOAL #6   Title Pt will improve DGI score from 9/24 to 14/24 to indicate decreased fall risk, increased dynamic gait stability. Target date: 04/01/15   Status On-going   PT LONG TERM GOAL #7   Title Pt will negotiate standard ramp and curb step with mod I using LRAD to indicate ability to safely traverse community obstacles. Target date: 04/01/15   Status On-going               Plan - 03/25/15 0959    Clinical Impression Statement Session focused on standing tolerance and modifying HEP. Pt reporting difficulty consistently tolerating 2 sets per day of HEP. Therefore, removed habituaton exercise (nose to knee); added seated gaze stabilization; and progressed  standing balance HEP. Dynamic standing tolerance limited to 5.5 minutes duirng this session.   Pt will benefit from skilled therapeutic intervention in order to improve on the following deficits Dizziness;Decreased balance;Decreased activity tolerance;Abnormal gait;Decreased coordination;Impaired sensation;Decreased strength;Impaired tone;Impaired vision/preception   Rehab Potential Good   PT Frequency 2x / week   PT Duration 4 weeks   PT Treatment/Interventions ADLs/Self Care Home Management;Gait training;Stair training;Functional mobility training;Therapeutic activities;Vestibular;Visual/perceptual remediation/compensation;Neuromuscular re-education;Patient/family education;Therapeutic exercise;Balance training;DME Instruction;Orthotic Fit/Training;Energy conservation   PT Next Visit Plan Provide pt with HEP to continue post-DC from PT. Progress seated gaze stabilization, as tolerated. Consider adding habituation of vertical head turns to HEP. Continue dynamic gait activities.   Consulted and Agree with Plan of Care Patient        Problem List Patient Active Problem List   Diagnosis Date Noted  . Multiple sclerosis ([place]) 10/26/2014  . Benign paroxysmal positional vertigo 10/12/2014  . 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 Urinary frequency 10/12/2014  . Other fatigue 10/12/2014  . Numbness 10/12/2014    Billie Ruddy, PT, DPT [facility] [address] [place], [place], [address] Phone: [phone]   Fax:  [phone] 03/25/2015, 10:03 AM  Name: Crystal Park MRN: [id number] Date of Birth: 26-Nov-1960

## 2015-03-25 NOTE — Patient Instructions (Signed)
    Feet Heel-Toe "Tandem", Varied Arm Positions - Eyes Open - Arms by your side    Stand with back to corner with stable chair in front of you and arms by your side. Try not to hold onto chair as you place the toes of your LEFT foot next to the heel of your RIGHT foot. Look straight ahead at a stationary object. Hold __30__ seconds. Switch foot position. Perform exercise one time with each foot in front. Do __2__ sessions per day.    Feet Apart (Compliant Surface) Varied Arm Positions - Eyes Closed    Stand with your back to a corner with a stable chair in front of you. Stand on 1 pillow with feet shoulder width apart and arms by your side.   1. Close eyes and visualize upright position. Hold__30__ seconds. Repeat __4__ times per session.  2. While still standing on the pillow, open your eyes and place feet together. Hold for 30 seconds, 4 times.  3. With eyes still open, turn your head from right to left 10 times, visually "spotting" an object on the wall. Then turn head up to down ("nodding) 5 times while visually spotting object on wall.  Perform the 3 above exercises twice per day.   Gaze Stabilization: Tip Card  1.Target must remain in focus, not blurry, and appear stationary while head is in motion. 2.Perform exercises with small head movements (45 to either side of midline). 3.Increase speed of head motion so long as target is in focus. 4.If you wear eyeglasses, be sure you can see target through lens (therapist will give specific instructions for bifocal / progressive lenses). 5.These exercises may provoke dizziness or nausea. Work through these symptoms. If too dizzy, slow head movement slightly. Rest between each exercise. 6.Exercises demand concentration; avoid distractions. 7.For safety, perform standing exercises close to a counter, wall, corner, or next to someone.  Gaze Stabilization: Sitting    Keeping eyes on target on wall 5-6 feet away, tilt head down  15-30 and move head side to side for __30__ seconds. Repeat while moving head up and down for _30__ seconds. Do _2___ sessions per day.

## 2015-03-30 ENCOUNTER — Ambulatory Visit: Payer: Managed Care, Other (non HMO) | Admitting: Physical Therapy

## 2015-03-30 DIAGNOSIS — R269 Unspecified abnormalities of gait and mobility: Secondary | ICD-10-CM | POA: Diagnosis not present

## 2015-03-30 DIAGNOSIS — R42 Dizziness and giddiness: Secondary | ICD-10-CM

## 2015-03-30 DIAGNOSIS — R2681 Unsteadiness on feet: Secondary | ICD-10-CM

## 2015-03-30 NOTE — Therapy (Signed)
[facility] [address] [place], [place], [address] Phone: [phone]   Fax:  [phone]  Physical Therapy Treatment  Patient Details  Name: Crystal Park MRN: [id number] Date of Birth: 1960/04/05 Referring Provider: Arlice Colt, MD  Encounter Date: 03/30/2015      PT End of Session - 03/30/15 2029    Visit Number 22   Number of Visits 23   Date for PT Re-Evaluation 04/03/15   Authorization Type [employer] - 20 visit limit for PT   Authorization - Visit Number 7   Authorization - Number of Visits 20   PT Start Time 0802   PT Stop Time 0845   PT Time Calculation (min) 43 min   Equipment Utilized During Treatment Gait belt   Activity Tolerance Other (comment)  intermittent rest breaks due to dizziness/disequilibrium   Behavior During Therapy [facility] for tasks assessed/performed      Past Medical History  Diagnosis Date  . Benign paroxysmal positional vertigo 10/12/2014    No past surgical history on file.  There were no vitals filed for this visit.  Visit Diagnosis:  Dizziness and giddiness  Unsteadiness  Abnormality of gait      Subjective Assessment - 03/30/15 0805    Subjective Pt notes significant improvement in gait stability when using L FootUp brace.   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 No/denies                         [facility] Adult PT Treatment/Exercise - 03/30/15 0001    Ambulation/Gait   Stairs Assistance 5: Supervision;4: Min guard   Stairs Assistance  Details (indicate cue type and reason) ascended without rails with supervision; descended with finger-tip support  with min guard.   Stair Management Technique No rails;Alternating pattern;Forwards   Curb 5: Supervision   Curb Details (indicate cue type and reason) using SPC, performed x4 consecutive reps with cueing for anterior weight shift (emphasis during ascent), sequencing with SPC. Noted effective within-session carryover. when returned to curb step negotiation with SPC x3 additional reps atr end of session.   Gait Comments L heel dips x11 (to pt fatigue); initial 5 reps with R rail and supervision; remaining reps with SPC.and min guard.             Balance Exercises - 03/30/15 0839    Balance Exercises: Standing   Other Standing Exercises Pt engaged in standing task (tic-tac-toe and hangman at large mirror) involving visual scanning and movement laterally, superior, and inferior x10.5 minutes consecutively without need for seated rest break. Subjective symptoms increased from 3/10 to 4/10 with task.             PT Short Term Goals - 02/18/15 0830    PT SHORT TERM GOAL #1   Title Pt will perform initial HEP with mod I using paper handout to maximize progress made in PT. Target date: 02/04/15   Baseline Met 11/30.   Status Achieved   PT SHORT TERM GOAL #2   Title During gait x150',  pt will utilize compensatory strategies for dizziness/disequilibrium without cueing to increase pt safety with ambulation. Target date: 02/04/15   Baseline Met 11/30.   Status Achieved   PT SHORT TERM GOAL #3   Title Pt will ambulate x200' over level, indoor surfaces with effective obstacle negotiation with mod I using LRAD to indicate safety with household mobility. Target date: 02/04/15   Baseline Met 11/30.   Status Achieved   PT SHORT TERM GOAL #4   Title Pt will improve gait speed from 2.34 ft/sec to > 2.62 ft/sec to indicate status of community ambulatory. Target date: 02/04/15   Baseline Met  11/30. Gait velocity = 2.67 ft/sec   Status Achieved   PT SHORT TERM GOAL #5   Title Pt will negotiate 16 stairs with single rail with with no more than 2-point increase in subjective dizziness to increase pt tolerance to stair negotiation in home. Target date: 02/06/15   Baseline Met 12/16.   Status Achieved           PT Long Term Goals - 03/30/15 0737    PT LONG TERM GOAL #1   Title Pt will perform dynamic standing task for 10 consecutive minutes without seated rest break to indicate pt ability to unload dishwasher. Modified target date: 04/01/15   Baseline 1/25: Pt engaged in standing activity x10.5 consecutive minutes consecutively without rest break. Subject dizziness increased from 3/10 to 4/10.   Status Achieved   PT LONG TERM GOAL #2   Title Pt will decrease DHI score from 84 to </= 61 to indicate decreased pt-perceived disability due to dizziness.  Modified target date: 04/01/15   Baseline Continue goal through renewed POC.   Status On-going   PT LONG TERM GOAL #3   Title Pt will independently negotiate 16 stairs with single rail using reciprocal pattern to indicate increased stability/independence accessing second floor of home. Target date: 03/04/15   Baseline Met 12/16.   Status Achieved   PT LONG TERM GOAL #4   Title Pt will ambulate 500' over unlevel, paved surfaces with mod I using LRAD to indicate increased safety with community mobility. Target date: 03/04/15   Baseline Met 12/30 using SPC and L Foot Up brace.   Status Achieved   PT LONG TERM GOAL #5   Title Pt will report ability to her walk dog for >/= 10 minutes without loss of balance to indicate safe return to leisure activities. Modified target date: 04/01/15   Baseline 12/30: Pt able to walk dog only to mail box due to disequilibrium with acceleration/deceleration.   REVISED to add >/= 10 minutes.  Continue goal through renewed POC.   Status On-going   PT LONG TERM GOAL #6   Title Pt will improve DGI score from  9/24 to 14/24 to indicate decreased fall risk, increased dynamic gait stability. Target date: 04/01/15   Status On-going   PT LONG TERM GOAL #7   Title Pt will negotiate standard ramp and curb step with mod I using LRAD to indicate ability to safely traverse community obstacles. Target date: 04/01/15   Status On-going               Plan - 03/30/15 2030    Clinical Impression Statement Pt met LTG 1 for standing tolerance, as pt engaged in standing activity for 10.5 minutes consecutively without seated rest break during this session. Also addressed community obstacle negotiation and dynamic gait. Plan to discharge from PT at next session; pt in full agreement with  plan for upcoming DC.   Pt will benefit from skilled therapeutic intervention in order to improve on the following deficits Dizziness;Decreased balance;Decreased activity tolerance;Abnormal gait;Decreased coordination;Impaired sensation;Decreased strength;Impaired tone;Impaired vision/preception   Rehab Potential Good   PT Frequency 2x / week   PT Duration 4 weeks   PT Treatment/Interventions ADLs/Self Care Home Management;Gait training;Stair training;Functional mobility training;Therapeutic activities;Vestibular;Visual/perceptual remediation/compensation;Neuromuscular re-education;Patient/family education;Therapeutic exercise;Balance training;DME Instruction;Orthotic Fit/Training;Energy conservation   PT Next Visit Plan Finish checking goals and DC. Provide pt with HEP to continue post-DC from PT.    Consulted and Agree with Plan of Care Patient        Problem List Patient Active Problem List   Diagnosis Date Noted  . Multiple sclerosis ([place]) 10/26/2014  . Benign paroxysmal positional vertigo 10/12/2014  . 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 Urinary frequency 10/12/2014  . Other fatigue 10/12/2014  . Numbness 10/12/2014    Billie Ruddy, PT, DPT [facility] [address] [place], [place], [address] Phone: [phone]   Fax:  [phone] 03/30/2015, 8:35 PM   Name: Crystal Park MRN: [id number] Date of Birth: 01-Oct-1960

## 2015-04-01 ENCOUNTER — Ambulatory Visit: Payer: Managed Care, Other (non HMO) | Admitting: Physical Therapy

## 2015-04-01 DIAGNOSIS — R269 Unspecified abnormalities of gait and mobility: Secondary | ICD-10-CM | POA: Diagnosis not present

## 2015-04-01 DIAGNOSIS — R42 Dizziness and giddiness: Secondary | ICD-10-CM

## 2015-04-01 DIAGNOSIS — R2681 Unsteadiness on feet: Secondary | ICD-10-CM

## 2015-04-01 NOTE — Therapy (Signed)
[facility] [address] [place], [place], [address] Phone: [phone]   Fax:  [phone]  Physical Therapy Treatment  Patient Details  Name: DENALY GATLING MRN: [id number] Date of Birth: 09/05/1960 Referring Provider: Arlice Colt, MD  Encounter Date: 04/01/2015      PT End of Session - 04/01/15 0903    Visit Number 23   Number of Visits 23   Date for PT Re-Evaluation 04/03/15   Authorization Type [employer] - 20 visit limit for PT   Authorization - Visit Number 8   Authorization - Number of Visits 20   PT Start Time 0802   PT Stop Time 0841   PT Time Calculation (min) 39 min   Equipment Utilized During Treatment Gait belt   Activity Tolerance Patient tolerated treatment well   Behavior During Therapy [facility] for tasks assessed/performed      Past Medical History  Diagnosis Date  . Benign paroxysmal positional vertigo 10/12/2014    No past surgical history on file.  There were no vitals filed for this visit.  Visit Diagnosis:  Dizziness and giddiness  Abnormality of gait  Unsteadiness      Subjective Assessment - 04/01/15 0805    Subjective "I woke up feeling great this morning, and then I got up and moved and felt dizzy again. 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 No/denies                         [facility] Adult PT Treatment/Exercise - 04/01/15 0001    Ambulation/Gait    Ambulation/Gait Yes   Ambulation/Gait Assistance 6: Modified independent (Device/Increase time)   Ambulation Distance (Feet) 375 Feet   Assistive device Straight cane;Other (Comment)  L FootUp brace   Gait Pattern Decreased stride length;Decreased weight shift to right;Narrow base of support;Step-through pattern  BOS more normalized as compared with previous sessions   Ambulation Surface Level;Indoor   Stairs Yes   Stairs Assistance 6: Modified independent (Device/Increase time)   Stairs Assistance Details (indicate cue type and reason) Pt performed with mod I, increased time without AD using single R rail. Per pt question about how to negotiate stairs with [facility], educated pt on use of SPC on stairs with effective return demo from pt.   Stair Management Technique One rail Right   Number of Stairs 8   Height of Stairs 6   Curb 6: Modified independent (Device/increase time)   Curb Details (indicate cue type and reason) using SPC   Dynamic Gait Index   Level Surface Mild Impairment  using SPC   Change in Gait Speed Moderate Impairment   Gait with Horizontal Head Turns Mild Impairment   Gait with Vertical Head Turns Moderate Impairment   Gait and Pivot Turn Mild Impairment   Step Over Obstacle Mild Impairment   Step Around Obstacles Mild Impairment   Steps Mild Impairment   Total Score 14  PT Education - 04/01/15 0913    Education provided Yes   Education Details Goals, findings, progress, and DC plan. Discussed HEP progression; see Pt Instructions for details.   Person(s) Educated Patient   Methods Explanation;Handout   Comprehension Returned demonstration;Verbalized understanding          PT Short Term Goals - 02/18/15 0830    PT SHORT TERM GOAL #1   Title Pt will perform initial HEP with mod I using paper handout to maximize progress made in PT. Target date: 02/04/15   Baseline Met 11/30.   Status Achieved   PT SHORT TERM GOAL #2   Title During gait  x150', pt will utilize compensatory strategies for dizziness/disequilibrium without cueing to increase pt safety with ambulation. Target date: 02/04/15   Baseline Met 11/30.   Status Achieved   PT SHORT TERM GOAL #3   Title Pt will ambulate x200' over level, indoor surfaces with effective obstacle negotiation with mod I using LRAD to indicate safety with household mobility. Target date: 02/04/15   Baseline Met 11/30.   Status Achieved   PT SHORT TERM GOAL #4   Title Pt will improve gait speed from 2.34 ft/sec to > 2.62 ft/sec to indicate status of community ambulatory. Target date: 02/04/15   Baseline Met 11/30. Gait velocity = 2.67 ft/sec   Status Achieved   PT SHORT TERM GOAL #5   Title Pt will negotiate 16 stairs with single rail with with no more than 2-point increase in subjective dizziness to increase pt tolerance to stair negotiation in home. Target date: 02/06/15   Baseline Met 12/16.   Status Achieved           PT Long Term Goals - 04/01/15 6440    PT LONG TERM GOAL #1   Title Pt will perform dynamic standing task for 10 consecutive minutes without seated rest break to indicate pt ability to unload dishwasher. Modified target date: 04/01/15   Baseline 1/25: Pt engaged in standing activity x10.5 consecutive minutes consecutively without rest break. Subject dizziness increased from 3/10 to 4/10.   Status Achieved   PT LONG TERM GOAL #2   Title Pt will decrease DHI score from 84 to </= 61 to indicate decreased pt-perceived disability due to dizziness.  Modified target date: 04/01/15   Baseline DHI score = 86   Status Not Met   PT LONG TERM GOAL #3   Title Pt will independently negotiate 16 stairs with single rail using reciprocal pattern to indicate increased stability/independence accessing second floor of home. Target date: 03/04/15   Baseline Met 12/16.   Status Achieved   PT LONG TERM GOAL #4   Title Pt will ambulate 500' over unlevel, paved surfaces with mod I using LRAD to  indicate increased safety with community mobility. Target date: 03/04/15   Baseline Met 12/30 using SPC and L Foot Up brace.   Status Achieved   PT LONG TERM GOAL #5   Title Pt will report ability to her walk dog for >/= 10 minutes without loss of balance to indicate safe return to leisure activities. Modified target date: 04/01/15   Baseline 1/27: Pt has walked dog to her mailbox (> 5 minutes but < 10 minutes).   Status Partially Met   PT LONG TERM GOAL #6   Title Pt will improve DGI score from 9/24 to 14/24 to indicate decreased fall risk, increased dynamic gait stability. Target date: 04/01/15   Baseline 1/27: DGI score = 14/24   Status  Achieved   PT LONG TERM GOAL #7   Title Pt will negotiate standard ramp and curb step with mod I using LRAD to indicate ability to safely traverse community obstacles. Target date: 04/01/15   Baseline Met 1/27.   Status Achieved               Plan - 04/01/15 0909    Clinical Impression Statement Pt has met all STG's and 5 of 7 LTG's, reports significant improvement in QoL related to dizziness, and is now mod I for all household and community mobility using L FootUp brace and SPC with quad attachment. LTG forr walking dog >/= 10 minutes partially met, as pt has walked dog > 5 minutes but not quite 10 minutes. LTG for Dizziness Handicap Inventory not met, as DHI score increased from 84 to 86. This may be attributable to increased pt awareness of dizziness during mobility.  Pt will be discharge from outpatient PT at this time. Pt verbalized understanding and was in full agreement with DC plan.   Consulted and Agree with Plan of Care Patient        Problem List Patient Active Problem List   Diagnosis Date Noted  . Multiple sclerosis ([place]) 10/26/2014  . Benign paroxysmal positional vertigo 10/12/2014  . 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 Urinary frequency 10/12/2014  . Other fatigue 10/12/2014  . Numbness 10/12/2014   PHYSICAL THERAPY DISCHARGE  SUMMARY  Visits from Start of Care: 23  Current functional level related to goals / functional outcomes: See goals and statuses of goals above.   Remaining deficits: Pt continues to experience dizziness and motion sensitivity with functional head movement. Pt has demonstrated understanding of compensatory strategies and is proficient at self-monitoring symptoms and modifying activity accordingly. Pt also continues to exhibit gait deviations; however, pt is mod I with all mobility at this time.   Education / Equipment: HEP and safe progression; compensatory strategies for central dizziness; recommendation of L FootUp brace.   Plan: Patient agrees to discharge.  Patient goals were met. Patient is being discharged due to meeting the stated rehab goals.  ?????      Billie Ruddy, PT, DPT [facility] [address] [place], [place], [address] Phone: [phone]   Fax:  [phone] 04/01/2015, 9:14 AM  Name: STANA BAYON MRN: [id number] Date of Birth: [birth year]

## 2015-04-01 NOTE — Patient Instructions (Addendum)
To progress gaze stabilization exercise (where you're looking at the "A" while moving your head):  1. Increase the speed of your head movement as long as the "A" remains in focus. 2. Increase the duration in 15-second increments (example: from 30 to 45 seconds) until you're able to perform in seated for 2 consecutive minutes. 3. Progress to standing (okay to hold onto stable surface initially) with duration of 30 seconds; then, increase duration in 15-second increments as described above until you're able to do this for 2 consecutive minutes. * Remember to rest between exercises, don't let symptoms get > 5/10, and stop if your dizziness increases by more than 2 points (out of 10) above where you started.   To progress exercise where you're standing on the pillow with your eyes closed: - Stand with feet closer together. - Stand on 2 pillows or a couch cushion. - Once this gets easier, remove the pillows altogether and stand in corner with eyes closed and SLOWLY move your head right/left 5 times and up/down 5 times.   To progress semi tandem stance: - Progress to tandem stance.

## 2015-04-26 ENCOUNTER — Ambulatory Visit (INDEPENDENT_AMBULATORY_CARE_PROVIDER_SITE_OTHER): Payer: Managed Care, Other (non HMO) | Admitting: Neurology

## 2015-04-26 ENCOUNTER — Encounter: Payer: Self-pay | Admitting: Neurology

## 2015-04-26 VITALS — BP 126/88 | HR 74 | Resp 14 | Ht 65.75 in | Wt 119.0 lb

## 2015-04-26 DIAGNOSIS — R5383 Other fatigue: Secondary | ICD-10-CM

## 2015-04-26 DIAGNOSIS — R200 Anesthesia of skin: Secondary | ICD-10-CM

## 2015-04-26 DIAGNOSIS — R260 Ataxic gait: Secondary | ICD-10-CM | POA: Diagnosis not present

## 2015-04-26 DIAGNOSIS — H8110 Benign paroxysmal vertigo, unspecified ear: Secondary | ICD-10-CM | POA: Diagnosis not present

## 2015-04-26 DIAGNOSIS — R350 Frequency of micturition: Secondary | ICD-10-CM

## 2015-04-26 DIAGNOSIS — G35 Multiple sclerosis: Secondary | ICD-10-CM | POA: Diagnosis not present

## 2015-04-26 MED ORDER — MECLIZINE HCL 25 MG PO TABS: 25.0000 mg | ORAL_TABLET | Freq: Three times a day (TID) | ORAL | Status: DC | PRN

## 2015-04-26 MED ORDER — MIRABEGRON ER 50 MG PO TB24: 50.0000 mg | ORAL_TABLET | Freq: Every day | ORAL | Status: DC

## 2015-04-26 MED ORDER — METHYLPREDNISOLONE 4 MG PO TABS: 4.0000 mg | ORAL_TABLET | Freq: Every day | ORAL | Status: DC

## 2015-04-26 NOTE — Progress Notes (Signed)
Marland Kitchen   [facility]  PATIENT: Crystal Park DOB: December 13, 1960  REFERRING DOCTOR OR PCP:  Narda Bonds (ENT referred)   [name] (PCP) SOURCE: paitent and records from Dr. Ezzard Standing.    CT images on PACS  _________________________________   HISTORICAL  CHIEF COMPLAINT:  Chief Complaint  Patient presents with  . Multiple Sclerosis    Sts. she is tolerating Tecfidera well.  Sts. dizziness is no better.  She has participated in vestibular rehab and sts. they have tought her ways to recover quicker from dizziness.  She is wearing a right afo and sts. this helps gait. Ambulatory with a cane.  Sts. she is having more stiffness in her torso/fim    HISTORY OF PRESENT ILLNESS:  Crystal Park is a 55 year old woman who was recently diagnosed with multiple sclerosis. She has vertigo and gait ataxia. MRI of the brain and spinal cord injury consistent with multiple sclerosis.  She started Tecfidera and has generally done well.   She once took on a near empty stomach and had diarrhea.   Otherwise, she has done well.    Vertigo:   She has had persistent vertigo for the past few months.   Austin Miles and other exercises helped minimally and lorazepam did not help.  PT helped her learn triggers to avoid.     She actually has had vertigo on and off for many years but have been more persistent the past few months.     She has seen ENT in the past .   Gait/strength/sensation:   Her gait is poorly balance. Sometimes she feels she gets pushed towards the left or back.  She denies any numbness or weakness in the arms or legs.   She felt she could move her toes better after a 7 day course of an antibiotic.     Vision:   She denies any difficulty with her vision.   Bladder:   She notes urinary frequency and urgency.   This has been helped some by Detrol.   She has had incontinence.    She had one episode of bowel incontinence.  Fatigue/sleep:   She has had fatigue for the past few  years that worsened this year. Amantadine has helped the fatigue some. She would like to continue that medication.     She does not sleep well as she wakes up easily and she needs to use the bathroom.  Mood/Cognitive:   She denies any depression or anxiety but is more irritable at times.   She has not noted any major cognitive issues  I have reviewed the MRI of the brain and the spinal cord.   The MRI of the spine shows several T2 weighted lesions, some peripherally located consistent with multiple sclerosis. None of the foci enhanced. The MRI of the brain shows multiple foci in the periventricular, juxtacortical cortical and deep white matter consistent with multiple sclerosis.   There is also a left cerebellar focus that could represent remote stroke (it is also present on CT scan from 2010)    REVIEW OF SYSTEMS: Constitutional: No fevers, chills, sweats, or change in appetite.   She notes fatigue Eyes: No visual changes, double vision, eye pain Ear, nose and throat: No hearing loss, ear pain, nasal congestion, sore throat Cardiovascular: No chest pain, palpitations Respiratory: No shortness of breath at rest or with exertion.   No wheezes GastrointestinaI: No nausea, vomiting, diarrhea, abdominal pain, fecal incontinence Genitourinary: As above. No UTIs. Musculoskeletal: No neck  pain, back pain Integumentary: No rash, pruritus, skin lesions Neurological: as above Psychiatric: No depression at this time.  Mild anxiety Endocrine: No palpitations, diaphoresis, change in appetite, change in weigh or increased thirst Hematologic/Lymphatic: No anemia, purpura, petechiae. Allergic/Immunologic: No itchy/runny eyes, nasal congestion, recent allergic reactions, rashes  ALLERGIES: No Known Allergies  HOME MEDICATIONS:  Current outpatient prescriptions:  .  amantadine (SYMMETREL) 100 MG capsule, Take 1 capsule (100 mg total) by mouth 2 (two) times daily., Disp: 180 capsule, Rfl: 3 .   cholecalciferol (VITAMIN D) 1000 UNITS tablet, Take 1,000 Units by mouth daily. otc vit. d 5,000iu daily/fim, Disp: , Rfl:  .  Dimethyl Fumarate 240 MG CPDR, Take 240 mg by mouth daily., Disp: , Rfl:  .  Multiple Vitamin (MULTIVITAMIN) tablet, Take 1 tablet by mouth daily., Disp: , Rfl:  .  tolterodine (DETROL LA) 4 MG 24 hr capsule, Reported on 04/26/2015, Disp: , Rfl:  .  LORazepam (ATIVAN) 0.5 MG tablet, Take 1 tablet (0.5 mg total) by mouth every 8 (eight) hours as needed for anxiety. (Patient not taking: Reported on 04/26/2015), Disp: 90 tablet, Rfl: 1 .  meclizine (ANTIVERT) 25 MG tablet, Take 1 tablet (25 mg total) by mouth 3 (three) times daily as needed for dizziness., Disp: 30 tablet, Rfl: 0 .  methylPREDNISolone (MEDROL) 4 MG tablet, Take 1 tablet (4 mg total) by mouth daily., Disp: 21 tablet, Rfl: 0 .  mirabegron ER (MYRBETRIQ) 50 MG TB24 tablet, Take 1 tablet (50 mg total) by mouth daily., Disp: 30 tablet, Rfl: 11 No current facility-administered medications for this visit.  Facility-Administered Medications Ordered in Other Visits:  .  gadopentetate dimeglumine (MAGNEVIST) injection 12 mL, 12 mL, Intravenous, Once PRN, Asa Lente, MD  PAST MEDICAL HISTORY: Past Medical History  Diagnosis Date  . Benign paroxysmal positional vertigo 10/12/2014    PAST SURGICAL HISTORY: History reviewed. No pertinent past surgical history.  FAMILY HISTORY: History reviewed. No pertinent family history.  SOCIAL HISTORY:  Social History   Social History  . Marital Status: Married    Spouse Name: N/A  . Number of Children: N/A  . Years of Education: N/A   Occupational History  . Not on file.   Social History Main Topics  . Smoking status: Never Smoker   . Smokeless tobacco: Not on file  . Alcohol Use: Not on file  . Drug Use: Not on file  . Sexual Activity: Not on file   Other Topics Concern  . Not on file   Social History Narrative     PHYSICAL EXAM  Filed Vitals:    04/26/15 0845  BP: 126/88  Pulse: 74  Resp: 14  Height: 5' 5.75" (1.67 m)  Weight: 119 lb (53.978 kg)    Body mass index is 19.35 kg/(m^2).   General: The patient is well-developed and well-nourished and in no acute distress  Neurologic Exam  Mental status: The patient is alert and oriented x 3 at the time of the examination. The patient has apparent normal recent and remote memory, with an apparently normal attention span and concentration ability.   Speech is normal.  Cranial nerves: Extraocular movements are full.   There is good facial sensation to soft touch bilaterally.Facial strength is normal.  Trapezius and sternocleidomastoid strength is normal. No dysarthria is noted.    No obvious hearing deficits are noted.  Motor:  Muscle bulk is normal.   Tone is normal. Strength is  5 / 5 in the arms and proximal  legs. However, strength is 4/5 in the EHL and intrinsic foot muscles.   Sensory: Sensory testing is intact to touch and vibration sensation in the arms. She has reduced vibration sensation at ankles.  Coordination: Cerebellar testing reveals good finger-nose-finger and reduced heel-to-shin bilaterally.  Gait and station: Station is normal.   Gait is wide stance with unstable turn.   She is unable to do tandem walk.. Romberg is negative.   Reflexes: Deep tendon reflexes are symmetric and 3+ bilaterally  and increased in the knees (with spread).  No ankle clonus.Marland Kitchen       DIAGNOSTIC DATA (LABS, IMAGING, TESTING) - I reviewed patient records, labs, notes, testing and imaging myself where available.     ASSESSMENT AND PLAN  Multiple sclerosis (HCC) - Plan: MR Brain W Wo Contrast, CBC with Differential/Platelet, Comprehensive metabolic panel  Ataxic gait - Plan: MR Brain W Wo Contrast  Numbness  Other fatigue  Urinary frequency  Benign paroxysmal positional vertigo, unspecified laterality   1.   Vertigo has persisted despite exercises and PT and trials of  medications.   I will place her on a higher strength of meclizine and she will continue the exercises. 2.   Continue Tecfidera.   Check CBC, CMP.     We will check an MRI of the brain next month to make sure that there has not been subclinical progression. If present, we will need to consider a different disease modifying therapy. 3.   Stay active and exercises tolerated. 4.    Add Myrbetriq for the bladder issues.   Medications such as Vesicare and Detrol have been tried and helped some but she continues to have urinary frequency and urgency  and incontinence.   Vesicare worked better than the [employer]..   I will see her back 3-4 months or sooner if she has new or worsening symptoms.  40 minute face to face evaluation with > 1/2 the time counseling and coordinating care regarding her MS, vertigo and other symptoms.  Hermes Wafer A. Epimenio Foot, MD, PhD 04/26/2015, 10:12 AM Certified in Neurology, Clinical Neurophysiology, Sleep Medicine, Pain Medicine and Neuroimaging  [facility] [address], [address] [place], [place] [address] [phone]

## 2015-04-27 LAB — CBC WITH DIFFERENTIAL/PLATELET
Basophils Absolute: 0.1 10*3/uL (ref 0.0–0.2)
Basos: 1 %
EOS (ABSOLUTE): 0.2 10*3/uL (ref 0.0–0.4)
Eos: 3 %
Hematocrit: 42.4 % (ref 34.0–46.6)
Hemoglobin: 14.1 g/dL (ref 11.1–15.9)
Immature Grans (Abs): 0 10*3/uL (ref 0.0–0.1)
Immature Granulocytes: 0 %
Lymphocytes Absolute: 1 10*3/uL (ref 0.7–3.1)
Lymphs: 14 %
MCH: 31.4 pg (ref 26.6–33.0)
MCHC: 33.3 g/dL (ref 31.5–35.7)
MCV: 94 fL (ref 79–97)
Monocytes Absolute: 0.9 10*3/uL (ref 0.1–0.9)
Monocytes: 12 %
Neutrophils Absolute: 5.1 10*3/uL (ref 1.4–7.0)
Neutrophils: 70 %
Platelets: 459 10*3/uL — ABNORMAL HIGH (ref 150–379)
RBC: 4.49 x10E6/uL (ref 3.77–5.28)
RDW: 14.2 % (ref 12.3–15.4)
WBC: 7.2 10*3/uL (ref 3.4–10.8)

## 2015-04-27 LAB — COMPREHENSIVE METABOLIC PANEL
ALT: 16 IU/L (ref 0–32)
AST: 11 IU/L (ref 0–40)
Albumin/Globulin Ratio: 1.8 (ref 1.1–2.5)
Albumin: 4.5 g/dL (ref 3.5–5.5)
Alkaline Phosphatase: 63 IU/L (ref 39–117)
BUN/Creatinine Ratio: 22 (ref 9–23)
BUN: 14 mg/dL (ref 6–24)
Bilirubin Total: 0.6 mg/dL (ref 0.0–1.2)
CO2: 23 mmol/L (ref 18–29)
Calcium: 10.3 mg/dL — ABNORMAL HIGH (ref 8.7–10.2)
Chloride: 107 mmol/L — ABNORMAL HIGH (ref 96–106)
Creatinine, Ser: 0.64 mg/dL (ref 0.57–1.00)
GFR calc Af Amer: 119 mL/min/{1.73_m2} (ref >59)
GFR calc non Af Amer: 103 mL/min/{1.73_m2} (ref >59)
Globulin, Total: 2.5 g/dL (ref 1.5–4.5)
Glucose: 99 mg/dL (ref 65–99)
Potassium: 5.5 mmol/L — ABNORMAL HIGH (ref 3.5–5.2)
Sodium: 145 mmol/L — ABNORMAL HIGH (ref 134–144)
Total Protein: 7 g/dL (ref 6.0–8.5)

## 2015-05-02 DIAGNOSIS — Z0271 Encounter for disability determination: Secondary | ICD-10-CM

## 2015-05-15 ENCOUNTER — Other Ambulatory Visit: Payer: Self-pay | Admitting: Neurology

## 2015-05-18 ENCOUNTER — Ambulatory Visit (INDEPENDENT_AMBULATORY_CARE_PROVIDER_SITE_OTHER): Payer: Managed Care, Other (non HMO)

## 2015-05-18 DIAGNOSIS — R260 Ataxic gait: Secondary | ICD-10-CM

## 2015-05-18 DIAGNOSIS — G35 Multiple sclerosis: Secondary | ICD-10-CM

## 2015-05-18 MED ORDER — GADOPENTETATE DIMEGLUMINE 469.01 MG/ML IV SOLN: 11.0000 mL | Freq: Once | INTRAVENOUS | Status: DC | PRN

## 2015-05-20 ENCOUNTER — Telehealth: Payer: Self-pay | Admitting: *Deleted

## 2015-05-20 MED ORDER — MECLIZINE HCL 25 MG PO TABS: 25.0000 mg | ORAL_TABLET | Freq: Three times a day (TID) | ORAL | Status: DC | PRN

## 2015-05-20 NOTE — Telephone Encounter (Signed)
-----   Message from Asa Lente, MD sent at 05/19/2015  4:16 PM EDT ----- Please note that the MRI of the brain is unchanged.   Continue on therapy.

## 2015-05-20 NOTE — Telephone Encounter (Signed)
I have spoken with Crystal Park this morning and per RAS, advised mri brain is unchanged; she should continue meds as rx'd.  She verbalized understanding of same.  Sts. is out of Meclizine--rx. escribed to [facility] per request/fim

## 2015-07-18 ENCOUNTER — Telehealth: Payer: Self-pay | Admitting: *Deleted

## 2015-07-18 NOTE — Telephone Encounter (Signed)
Life insurance forms for [employer] completed, sent up front to be faxed back with copy mailed to pt/fim

## 2015-08-02 ENCOUNTER — Telehealth: Payer: Self-pay | Admitting: Neurology

## 2015-08-02 NOTE — Telephone Encounter (Signed)
Tiffany/Disability Determination [employer] [phone] called sts disablity paperwork was mailed 06/27/15. Has it been rec'd? She will now fax it MR [phone].

## 2015-08-05 NOTE — Telephone Encounter (Signed)
Husband called to advise, [place] needs additional information.

## 2015-08-10 ENCOUNTER — Encounter: Payer: Self-pay | Admitting: Neurology

## 2015-08-10 ENCOUNTER — Ambulatory Visit (INDEPENDENT_AMBULATORY_CARE_PROVIDER_SITE_OTHER): Payer: Managed Care, Other (non HMO) | Admitting: Neurology

## 2015-08-10 VITALS — BP 110/72 | HR 70 | Resp 14 | Ht 65.75 in | Wt 131.0 lb

## 2015-08-10 DIAGNOSIS — R5383 Other fatigue: Secondary | ICD-10-CM

## 2015-08-10 DIAGNOSIS — R260 Ataxic gait: Secondary | ICD-10-CM | POA: Diagnosis not present

## 2015-08-10 DIAGNOSIS — R200 Anesthesia of skin: Secondary | ICD-10-CM

## 2015-08-10 DIAGNOSIS — R350 Frequency of micturition: Secondary | ICD-10-CM

## 2015-08-10 DIAGNOSIS — G35 Multiple sclerosis: Secondary | ICD-10-CM | POA: Diagnosis not present

## 2015-08-10 DIAGNOSIS — R42 Dizziness and giddiness: Secondary | ICD-10-CM | POA: Diagnosis not present

## 2015-08-10 NOTE — Progress Notes (Signed)
pomj.   [facility]  PATIENT: Crystal Park DOB: 1960/04/21  REFERRING DOCTOR OR PCP:  Narda Bonds (ENT referred)   [name] (PCP) SOURCE: paitent and records from Dr. Ezzard Standing.    CT images on PACS  _________________________________   HISTORICAL  CHIEF COMPLAINT:  Chief Complaint  Patient presents with  . Multiple Sclerosis    Sts. she continues to tolerate Tecfidera well.  Sts. Vertigo was no better with increased dose of Meclizine, so she stopped it. Insurance wouldn't cover Myrbetriq, so she never started it.  Sts. bladder issues are tolerable at this time/fim  . Dizziness    HISTORY OF PRESENT ILLNESS:  Crystal Park is a 55 year old woman who was recently diagnosed with multiple sclerosis. She has vertigo and gait ataxia. MRI of the brain and spinal cord injury consistent with multiple sclerosis.  She started Tecfidera and has generally done well.   She once took on a near empty stomach and had diarrhea.   Otherwise, she has done well.    Vertigo:   She has had persistent vertigo, no better since the last visit. In the past, she saw her ENT and had ENG testing done that was reportedly consistent with central vertigo.  Austin Miles and other exercises helped minimally and lorazepam, meclizine and valium have not helped  . She has mild vertigo when sitting that increases with movement.   Vertigo is more translational than spinning.   PT helped her learn triggers to avoid.     She actually has had vertigo on and off for many years but have been more persistent the past few months.     She has seen ENT in the past .   Gait/strength/sensation:   Her gait is poorly balance. Sometimes she feels she gets pushed towards the left or back.  She denies any numbness or weakness in the arms or legs.      Vision:   She denies any difficulty with her vision.   Bladder:   She notes urinary frequency and urgency.   This has been helped some by Detrol.   She has had  incontinence.    She had one episode of bowel incontinence.  Fatigue/sleep:   She has had fatigue for the past few years that worsened this year. Amantadine has helped the fatigue some. She would like to continue that medication.     She does not sleep well as she wakes up easily and she needs to use the bathroom.  Mood/Cognitive:   She denies any depression or anxiety but is more irritable at times.   She has not noted any major cognitive issues  MS History:   About 2010, she had an event with vertigo and diplopia that took 6 months to improve.  After that she began to have issues with her legs.   I have reviewed the MRI of the brain and the spinal cord.   The MRI of the spine shows several T2 weighted lesions, some peripherally located consistent with multiple sclerosis. None of the foci enhanced. The MRI of the brain shows multiple foci in the periventricular, juxtacortical cortical and deep white matter consistent with multiple sclerosis.   There is also a left cerebellar focus that could represent remote stroke (it is also present on CT scan from 2010)    REVIEW OF SYSTEMS: Constitutional: No fevers, chills, sweats, or change in appetite.   She notes fatigue Eyes: No visual changes, double vision, eye pain Ear, nose and throat:  No hearing loss, ear pain, nasal congestion, sore throat.   Has vertigo Cardiovascular: No chest pain, palpitations Respiratory: No shortness of breath at rest or with exertion.   No wheezes GastrointestinaI: No nausea, vomiting, diarrhea, abdominal pain, fecal incontinence Genitourinary: As above. No UTIs. Musculoskeletal: No neck pain, back pain Integumentary: No rash, pruritus, skin lesions Neurological: as above Psychiatric: No depression at this time.  Mild anxiety Endocrine: No palpitations, diaphoresis, change in appetite, change in weigh or increased thirst Hematologic/Lymphatic: No anemia, purpura, petechiae. Allergic/Immunologic: No itchy/runny eyes,  nasal congestion, recent allergic reactions, rashes  ALLERGIES: No Known Allergies  HOME MEDICATIONS:  Current outpatient prescriptions:  .  amantadine (SYMMETREL) 100 MG capsule, Take 1 capsule (100 mg total) by mouth 2 (two) times daily., Disp: 180 capsule, Rfl: 3 .  cholecalciferol (VITAMIN D) 1000 UNITS tablet, Take 1,000 Units by mouth daily. otc vit. d 5,000iu daily/fim, Disp: , Rfl:  .  Dimethyl Fumarate 240 MG CPDR, Take 240 mg by mouth daily., Disp: , Rfl:  .  Multiple Vitamin (MULTIVITAMIN) tablet, Take 1 tablet by mouth daily., Disp: , Rfl:  .  tolterodine (DETROL LA) 4 MG 24 hr capsule, Reported on 04/26/2015, Disp: , Rfl:  .  LORazepam (ATIVAN) 0.5 MG tablet, Take 1 tablet (0.5 mg total) by mouth every 8 (eight) hours as needed for anxiety. (Patient not taking: Reported on 04/26/2015), Disp: 90 tablet, Rfl: 1 .  meclizine (ANTIVERT) 25 MG tablet, Take 1 tablet (25 mg total) by mouth 3 (three) times daily as needed for dizziness. (Patient not taking: Reported on 08/10/2015), Disp: 30 tablet, Rfl: 0 .  methylPREDNISolone (MEDROL) 4 MG tablet, Take 1 tablet (4 mg total) by mouth daily. (Patient not taking: Reported on 08/10/2015), Disp: 21 tablet, Rfl: 0 .  mirabegron ER (MYRBETRIQ) 50 MG TB24 tablet, Take 1 tablet (50 mg total) by mouth daily. (Patient not taking: Reported on 08/10/2015), Disp: 30 tablet, Rfl: 11 No current facility-administered medications for this visit.  Facility-Administered Medications Ordered in Other Visits:  .  gadopentetate dimeglumine (MAGNEVIST) injection 11 mL, 11 mL, Intravenous, Once PRN, Asa Lente, MD .  gadopentetate dimeglumine (MAGNEVIST) injection 12 mL, 12 mL, Intravenous, Once PRN, Asa Lente, MD  PAST MEDICAL HISTORY: Past Medical History  Diagnosis Date  . Benign paroxysmal positional vertigo 10/12/2014    PAST SURGICAL HISTORY: History reviewed. No pertinent past surgical history.  FAMILY HISTORY: History reviewed. No pertinent  family history.  SOCIAL HISTORY:  Social History   Social History  . Marital Status: Married    Spouse Name: N/A  . Number of Children: N/A  . Years of Education: N/A   Occupational History  . Not on file.   Social History Main Topics  . Smoking status: Never Smoker   . Smokeless tobacco: Not on file  . Alcohol Use: Not on file  . Drug Use: Not on file  . Sexual Activity: Not on file   Other Topics Concern  . Not on file   Social History Narrative     PHYSICAL EXAM  Filed Vitals:   08/10/15 1108  BP: 110/72  Pulse: 70  Resp: 14  Height: 5' 5.75" (1.67 m)  Weight: 131 lb (59.421 kg)    Body mass index is 21.31 kg/(m^2).   General: The patient is well-developed and well-nourished and in no acute distress.   Hearing is symmetric.  Neurologic Exam  Mental status: The patient is alert and oriented x 3 at the time of  the examination. The patient has apparent normal recent and remote memory, with an apparently normal attention span and concentration ability.   Speech is normal.  Cranial nerves: Extraocular movements are full.   There is good facial sensation to soft touch bilaterally.Facial strength is normal.  Trapezius and sternocleidomastoid strength is normal. No dysarthria is noted.    No obvious hearing deficits are noted.  Motor:  Muscle bulk is normal.   Tone is normal. Strength is  5 / 5 in the arms and proximal legs. However, strength is 4/5 in the EHL and intrinsic foot muscles.   Sensory: Sensory testing is intact to touch and vibration sensation in the arms. She has reduced vibration sensation at ankles.  Coordination: Cerebellar testing reveals good finger-nose-finger and reduced heel-to-shin bilaterally.  Gait and station: Station is normal.   Gait is wide stance with unstable turn.   She is unable to do tandem walk.. Romberg is negative.   Reflexes: Deep tendon reflexes are symmetric and 3+ bilaterally  and increased in the knees (with spread).  No  ankle clonus..     25 foot timed walk  9.5 seconds (average of 2 trials)    DIAGNOSTIC DATA (LABS, IMAGING, TESTING) - I reviewed patient records, labs, notes, testing and imaging myself where available.     ASSESSMENT AND PLAN  Multiple sclerosis (HCC)  Ataxic gait  Numbness  Other fatigue  Urinary frequency  Vertigo    1.   Trial of OTC dramamine for Vertigo.   Characteristics are more consistent with central vertigo. Unfortunately, benzodiazepines have not helped at all. 2.   Continue Tecfidera.    We also discussed other options including ocrelizumab and she will give this some thought. We went over the risks and benefits in detail. 3.   Stay active and exercises tolerated.   I'll have her try Ampyra to see if that will help her walking more. 4.   I will see her back 4 months or sooner if she has new or worsening symptoms.  40 minute face to face evaluation with > 1/2 the time counseling and coordinating care regarding her MS, vertigo and other symptoms.  Ayaan Ringle A. Epimenio Foot, MD, PhD 08/10/2015, 11:20 AM Certified in Neurology, Clinical Neurophysiology, Sleep Medicine, Pain Medicine and Neuroimaging  [facility] [address], [address] [place], [place] [address] [phone]

## 2015-08-11 ENCOUNTER — Encounter: Payer: Self-pay | Admitting: *Deleted

## 2015-08-15 ENCOUNTER — Telehealth: Payer: Self-pay | Admitting: Neurology

## 2015-08-15 NOTE — Telephone Encounter (Signed)
Faxed records to [facility] on 08/12/15 and received confirmation 08/12/15 at 1534

## 2015-08-16 ENCOUNTER — Encounter: Payer: Self-pay | Admitting: *Deleted

## 2015-09-01 DIAGNOSIS — Z0289 Encounter for other administrative examinations: Secondary | ICD-10-CM

## 2015-10-20 ENCOUNTER — Other Ambulatory Visit: Payer: Self-pay | Admitting: Obstetrics and Gynecology

## 2015-10-20 DIAGNOSIS — R928 Other abnormal and inconclusive findings on diagnostic imaging of breast: Secondary | ICD-10-CM

## 2015-10-27 ENCOUNTER — Other Ambulatory Visit: Payer: Self-pay | Admitting: Neurology

## 2015-10-29 ENCOUNTER — Emergency Department (HOSPITAL_COMMUNITY): Payer: Managed Care, Other (non HMO)

## 2015-10-29 ENCOUNTER — Inpatient Hospital Stay (HOSPITAL_COMMUNITY)
Admission: EM | Admit: 2015-10-29 | Discharge: 2015-11-04 | DRG: 872 | Disposition: A | Discharge status: Skilled Nursing Facility | Payer: Managed Care, Other (non HMO) | Attending: Internal Medicine | Admitting: Internal Medicine

## 2015-10-29 ENCOUNTER — Encounter (HOSPITAL_COMMUNITY): Payer: Self-pay | Admitting: Emergency Medicine

## 2015-10-29 DIAGNOSIS — B961 Klebsiella pneumoniae [K. pneumoniae] as the cause of diseases classified elsewhere: Secondary | ICD-10-CM | POA: Diagnosis present

## 2015-10-29 DIAGNOSIS — R32 Unspecified urinary incontinence: Secondary | ICD-10-CM | POA: Diagnosis present

## 2015-10-29 DIAGNOSIS — R112 Nausea with vomiting, unspecified: Secondary | ICD-10-CM | POA: Diagnosis present

## 2015-10-29 DIAGNOSIS — Z8249 Family history of ischemic heart disease and other diseases of the circulatory system: Secondary | ICD-10-CM

## 2015-10-29 DIAGNOSIS — E860 Dehydration: Secondary | ICD-10-CM | POA: Diagnosis present

## 2015-10-29 DIAGNOSIS — R110 Nausea: Secondary | ICD-10-CM | POA: Diagnosis present

## 2015-10-29 DIAGNOSIS — D72829 Elevated white blood cell count, unspecified: Secondary | ICD-10-CM | POA: Diagnosis present

## 2015-10-29 DIAGNOSIS — R531 Weakness: Secondary | ICD-10-CM | POA: Diagnosis present

## 2015-10-29 DIAGNOSIS — N390 Urinary tract infection, site not specified: Secondary | ICD-10-CM | POA: Diagnosis present

## 2015-10-29 DIAGNOSIS — G35 Multiple sclerosis: Secondary | ICD-10-CM

## 2015-10-29 DIAGNOSIS — A419 Sepsis, unspecified organism: Secondary | ICD-10-CM | POA: Diagnosis not present

## 2015-10-29 HISTORY — DX: Multiple sclerosis: G35

## 2015-10-29 LAB — BASIC METABOLIC PANEL
Anion gap: 9 (ref 5–15)
BUN: 25 mg/dL — ABNORMAL HIGH (ref 6–20)
CO2: 23 mmol/L (ref 22–32)
Calcium: 8.8 mg/dL — ABNORMAL LOW (ref 8.9–10.3)
Chloride: 106 mmol/L (ref 101–111)
Creatinine, Ser: 0.7 mg/dL (ref 0.44–1.00)
GFR calc Af Amer: >60 mL/min (ref >60)
GFR calc non Af Amer: >60 mL/min (ref >60)
Glucose, Bld: 109 mg/dL — ABNORMAL HIGH (ref 65–99)
Potassium: 4.2 mmol/L (ref 3.5–5.1)
Sodium: 138 mmol/L (ref 135–145)

## 2015-10-29 LAB — PHOSPHORUS: Phosphorus: 2.4 mg/dL — ABNORMAL LOW (ref 2.5–4.6)

## 2015-10-29 LAB — CBC
HCT: 40.7 % (ref 36.0–46.0)
Hemoglobin: 13.9 g/dL (ref 12.0–15.0)
MCH: 30.9 pg (ref 26.0–34.0)
MCHC: 34.2 g/dL (ref 30.0–36.0)
MCV: 90.4 fL (ref 78.0–100.0)
Platelets: 582 10*3/uL — ABNORMAL HIGH (ref 150–400)
RBC: 4.5 MIL/uL (ref 3.87–5.11)
RDW: 13.7 % (ref 11.5–15.5)
WBC: 21.6 10*3/uL — ABNORMAL HIGH (ref 4.0–10.5)

## 2015-10-29 LAB — URINALYSIS, ROUTINE W REFLEX MICROSCOPIC
Glucose, UA: NEGATIVE mg/dL
Ketones, ur: >80 mg/dL — AB
Nitrite: POSITIVE — AB
Protein, ur: 100 mg/dL — AB
Specific Gravity, Urine: 1.02 (ref 1.005–1.030)
pH: 5.5 (ref 5.0–8.0)

## 2015-10-29 LAB — CBC WITH DIFFERENTIAL/PLATELET
Basophils Absolute: 0 10*3/uL (ref 0.0–0.1)
Basophils Relative: 0 %
Eosinophils Absolute: 0.1 10*3/uL (ref 0.0–0.7)
Eosinophils Relative: 0 %
HCT: 43.8 % (ref 36.0–46.0)
Hemoglobin: 15 g/dL (ref 12.0–15.0)
Lymphocytes Relative: 9 %
Lymphs Abs: 1.9 10*3/uL (ref 0.7–4.0)
MCH: 31.3 pg (ref 26.0–34.0)
MCHC: 34.2 g/dL (ref 30.0–36.0)
MCV: 91.3 fL (ref 78.0–100.0)
Monocytes Absolute: 1.4 10*3/uL — ABNORMAL HIGH (ref 0.1–1.0)
Monocytes Relative: 7 %
Neutro Abs: 16.9 10*3/uL — ABNORMAL HIGH (ref 1.7–7.7)
Neutrophils Relative %: 84 %
Platelets: 614 10*3/uL — ABNORMAL HIGH (ref 150–400)
RBC: 4.8 MIL/uL (ref 3.87–5.11)
RDW: 13.6 % (ref 11.5–15.5)
WBC: 20.4 10*3/uL — ABNORMAL HIGH (ref 4.0–10.5)

## 2015-10-29 LAB — MAGNESIUM: Magnesium: 2 mg/dL (ref 1.7–2.4)

## 2015-10-29 LAB — COMPREHENSIVE METABOLIC PANEL
ALT: 13 U/L — ABNORMAL LOW (ref 14–54)
AST: 13 U/L — ABNORMAL LOW (ref 15–41)
Albumin: 3.8 g/dL (ref 3.5–5.0)
Alkaline Phosphatase: 90 U/L (ref 38–126)
Anion gap: 11 (ref 5–15)
BUN: 42 mg/dL — ABNORMAL HIGH (ref 6–20)
CO2: 18 mmol/L — ABNORMAL LOW (ref 22–32)
Calcium: 9 mg/dL (ref 8.9–10.3)
Chloride: 102 mmol/L (ref 101–111)
Creatinine, Ser: 0.83 mg/dL (ref 0.44–1.00)
GFR calc Af Amer: >60 mL/min (ref >60)
GFR calc non Af Amer: >60 mL/min (ref >60)
Glucose, Bld: 102 mg/dL — ABNORMAL HIGH (ref 65–99)
Potassium: 4.8 mmol/L (ref 3.5–5.1)
Sodium: 131 mmol/L — ABNORMAL LOW (ref 135–145)
Total Bilirubin: 0.7 mg/dL (ref 0.3–1.2)
Total Protein: 7.3 g/dL (ref 6.5–8.1)

## 2015-10-29 LAB — URINE MICROSCOPIC-ADD ON

## 2015-10-29 LAB — I-STAT CG4 LACTIC ACID, ED: Lactic Acid, Venous: 0.93 mmol/L (ref 0.5–1.9)

## 2015-10-29 LAB — LIPASE, BLOOD: Lipase: 32 U/L (ref 11–51)

## 2015-10-29 MED ORDER — ADULT MULTIVITAMIN W/MINERALS CH
1.0000 | ORAL_TABLET | Freq: Every day | ORAL | Status: DC
  Administered 2015-10-30 – 2015-11-04 (×6): 1 via ORAL
  Filled 2015-10-29 (×7): qty 1

## 2015-10-29 MED ORDER — MECLIZINE HCL 25 MG PO TABS
25.0000 mg | ORAL_TABLET | Freq: Once | ORAL | Status: AC
  Administered 2015-10-29: 25 mg via ORAL
  Filled 2015-10-29 (×2): qty 1

## 2015-10-29 MED ORDER — ACETAMINOPHEN 650 MG RE SUPP: 650.0000 mg | Freq: Four times a day (QID) | RECTAL | Status: DC | PRN

## 2015-10-29 MED ORDER — FESOTERODINE FUMARATE ER 4 MG PO TB24
4.0000 mg | ORAL_TABLET | Freq: Every day | ORAL | Status: DC
  Administered 2015-10-30 – 2015-11-03 (×5): 4 mg via ORAL
  Filled 2015-10-29 (×7): qty 1

## 2015-10-29 MED ORDER — DEXTROSE 5 % IV SOLN
1.0000 g | Freq: Once | INTRAVENOUS | Status: AC
  Administered 2015-10-29: 1 g via INTRAVENOUS
  Filled 2015-10-29: qty 10

## 2015-10-29 MED ORDER — ONDANSETRON HCL 4 MG PO TABS: 4.0000 mg | ORAL_TABLET | Freq: Four times a day (QID) | ORAL | Status: DC | PRN

## 2015-10-29 MED ORDER — AMANTADINE HCL 100 MG PO CAPS
100.0000 mg | ORAL_CAPSULE | Freq: Two times a day (BID) | ORAL | Status: DC
  Administered 2015-10-29 – 2015-11-03 (×11): 100 mg via ORAL
  Filled 2015-10-29 (×12): qty 1

## 2015-10-29 MED ORDER — DALFAMPRIDINE ER 10 MG PO TB12: 10.0000 mg | ORAL_TABLET | Freq: Two times a day (BID) | ORAL | Status: DC

## 2015-10-29 MED ORDER — DIPHENHYDRAMINE HCL 50 MG/ML IJ SOLN
25.0000 mg | Freq: Once | INTRAMUSCULAR | Status: AC
  Administered 2015-10-29: 25 mg via INTRAVENOUS
  Filled 2015-10-29: qty 1

## 2015-10-29 MED ORDER — SODIUM CHLORIDE 0.9 % IV SOLN
INTRAVENOUS | Status: DC
  Administered 2015-10-29: 23:00:00 via INTRAVENOUS
  Administered 2015-10-31: 75 mL/h via INTRAVENOUS
  Administered 2015-11-01: 04:00:00 via INTRAVENOUS
  Administered 2015-11-01: 75 mL/h via INTRAVENOUS

## 2015-10-29 MED ORDER — SODIUM CHLORIDE 0.9 % IV BOLUS (SEPSIS)
2000.0000 mL | Freq: Once | INTRAVENOUS | Status: AC
  Administered 2015-10-29: 2000 mL via INTRAVENOUS

## 2015-10-29 MED ORDER — DEXTROSE 5 % IV SOLN
1.0000 g | INTRAVENOUS | Status: DC
  Administered 2015-10-30 – 2015-11-02 (×4): 1 g via INTRAVENOUS
  Filled 2015-10-29 (×6): qty 10

## 2015-10-29 MED ORDER — DIMETHYL FUMARATE 240 MG PO CPDR
1.0000 | DELAYED_RELEASE_CAPSULE | Freq: Two times a day (BID) | ORAL | Status: DC
  Administered 2015-10-30 – 2015-11-03 (×10): 240 mg via ORAL

## 2015-10-29 MED ORDER — SODIUM CHLORIDE 0.9 % IV SOLN
8.0000 mg | Freq: Once | INTRAVENOUS | Status: AC
  Administered 2015-10-29: 8 mg via INTRAVENOUS
  Filled 2015-10-29: qty 4

## 2015-10-29 MED ORDER — ONDANSETRON HCL 4 MG/2ML IJ SOLN: 4.0000 mg | Freq: Four times a day (QID) | INTRAMUSCULAR | Status: DC | PRN

## 2015-10-29 MED ORDER — VITAMIN D3 25 MCG (1000 UNIT) PO TABS
1000.0000 [IU] | ORAL_TABLET | Freq: Every day | ORAL | Status: DC
  Administered 2015-10-30 – 2015-11-04 (×6): 1000 [IU] via ORAL
  Filled 2015-10-29 (×6): qty 1

## 2015-10-29 MED ORDER — HYDRALAZINE HCL 20 MG/ML IJ SOLN: 10.0000 mg | Freq: Four times a day (QID) | INTRAMUSCULAR | Status: DC | PRN

## 2015-10-29 MED ORDER — GADOBENATE DIMEGLUMINE 529 MG/ML IV SOLN
11.0000 mL | Freq: Once | INTRAVENOUS | Status: AC | PRN
  Administered 2015-10-29: 11 mL via INTRAVENOUS

## 2015-10-29 MED ORDER — ACETAMINOPHEN 325 MG PO TABS
650.0000 mg | ORAL_TABLET | Freq: Four times a day (QID) | ORAL | Status: DC | PRN
  Administered 2015-10-30 – 2015-11-02 (×3): 650 mg via ORAL
  Filled 2015-10-29 (×3): qty 2

## 2015-10-29 NOTE — ED Notes (Signed)
Patient transported to MRI 

## 2015-10-29 NOTE — ED Triage Notes (Addendum)
Patient is being treated with MS.  She is complaining of N/V for 1 week.  No diarrhea, fever, or cough.  Denies abdominal pain, no blood in stool/vomit.  She has a decreased appetite.   She has neck pain from a fall 1 week ago. Denies hitting head or LOC.  Cleared EMS [facility] assessment.  Unable to ambulatory without assistance, this is new.  Initial VS:  RV:UYEB 120 palpated        Sitting up 96 palpated  HR: 124  BP: 140/97   HR: 110  CBG:140 98% on room air R:22  EMS administered 4 mg of Zofran 500 ml of N/S

## 2015-10-29 NOTE — ED Notes (Signed)
WILL GIVE REPORT AND TRANSPORT PT TO 6E 1613-1. AAOX4. PT IN NO APPARENT DISTRESS OR PAIN. THE OPPORTUNITY TO ASK QUESTIONS WAS PROVIDED. FAMILY AT THE BEDSIDE.

## 2015-10-29 NOTE — H&P (Signed)
History and Physical    Crystal Fellowsatricia M Espinal ZOX:[id number] DOB: April 08, 1960 DOA: 10/29/2015  Referring MD/NP/PA: Dr. Rolland PorterMark James   PCP:  Duane Lopeoss, Alan, MD   Outpatient Specialists: Neurology, Dr. Epimenio FootSater   Patient coming from: home   Chief Complaint: nausea, weakness   HPI: Crystal Park is a 55 y.o. female with medical history significant for MS, gait imbalance who presented from home to [facility] ED with worsening weakness, nausea and vomiting over past few days prior to this admission. She felt weak and fatigued. Patient reports no associated abdominal pain, no fevers or chills. No diarrhea or constipation. No reports of chest pain, shortness of breath or palpitations.    ED Course: In ED, pt was hemodynamically stable. She was very nauseous  nd did not tolerate much PO intake. Her WBC count was 20.4, sodium 131, glucose 102. UA was significant for many bacteria and too numerous to count WBC. She was admitted for further management of UTI. Rocephin started in ED.  Review of Systems:  Constitutional: Negative for fever, chills, diaphoresis, activity change, appetite change and fatigue.  HENT: Negative for ear pain, nosebleeds, congestion, facial swelling, rhinorrhea, neck pain, neck stiffness and ear discharge.   Eyes: Negative for pain, discharge, redness, itching and visual disturbance.  Respiratory: Negative for cough, choking, chest tightness, shortness of breath, wheezing and stridor.   Cardiovascular: Negative for chest pain, palpitations and leg swelling.  Gastrointestinal: Negative for abdominal distention. Positive for nausea, vomiting   Genitourinary: Negative for frequency, hematuria, flank pain, decreased urine volume, difficulty urinating and dyspareunia.  Musculoskeletal: Negative for back pain, joint swelling, arthralgias and gait problem.  Neurological: Negative for dizziness, tremors, seizures, syncope, facial asymmetry, speech difficulty, light-headedness, numbness and  headaches.  Hematological: Negative for adenopathy. Does not bruise/bleed easily.  Psychiatric/Behavioral: Negative for hallucinations, behavioral problems, confusion, dysphoric mood, decreased concentration and agitation.   Past Medical History:  Diagnosis Date  . Benign paroxysmal positional vertigo 10/12/2014  . MS (multiple sclerosis) (HCC)    diagnosed 9/16    Past Surgical History:  Procedure Laterality Date  . OOPHORECTOMY Left    2013    Social history:  reports that she has never smoked. She has never used smokeless tobacco. She reports that she does not drink alcohol or use drugs.  Ambulation: able to ambulate with minimal assistance if unsteady   No Known Allergies  Family history: hypertension in mother   Prior to Admission medications   Medication Sig Start Date End Date Taking? Authorizing Provider  amantadine (SYMMETREL) 100 MG capsule Take 1 capsule (100 mg total) by mouth 2 (two) times daily. 12/23/14  Yes Asa Lenteichard A Sater, MD  cholecalciferol (VITAMIN D) 1000 UNITS tablet Take 1,000 Units by mouth daily. otc vit. d 5,000iu daily/fim   Yes Historical Provider, MD  CRANBERRY PO Take 1 capsule by mouth daily.   Yes Historical Provider, MD  dalfampridine 10 MG TB12 Take 10 mg by mouth 2 (two) times daily.   Yes Historical Provider, MD  dimenhyDRINATE (DRAMAMINE) 50 MG tablet Take 50 mg by mouth every 8 (eight) hours as needed for dizziness.   Yes Historical Provider, MD  Multiple Vitamin (MULTIVITAMIN) tablet Take 1 tablet by mouth daily.   Yes Historical Provider, MD  progesterone (PROMETRIUM) 200 MG capsule Take 200 mg by mouth daily. For 10 days 10/18/15  Yes Historical Provider, MD  TECFIDERA 240 MG CPDR TAKE 1 CAPSULE BY MOUTH TWICE DAILY 10/28/15  Yes Asa Lenteichard A Sater, MD  tolterodine (  DETROL LA) 4 MG 24 hr capsule Take 4 mg by mouth daily. Reported on 04/26/2015 09/14/14  Yes Historical Provider, MD  LORazepam (ATIVAN) 0.5 MG tablet Take 1 tablet (0.5 mg total) by  mouth every 8 (eight) hours as needed for anxiety. Patient not taking: Reported on 04/26/2015 11/19/14   Asa Lente, MD  meclizine (ANTIVERT) 25 MG tablet Take 1 tablet (25 mg total) by mouth 3 (three) times daily as needed for dizziness. Patient not taking: Reported on 08/10/2015 05/20/15   Asa Lente, MD  methylPREDNISolone (MEDROL) 4 MG tablet Take 1 tablet (4 mg total) by mouth daily. Patient not taking: Reported on 08/10/2015 04/26/15   Asa Lente, MD  mirabegron ER (MYRBETRIQ) 50 MG TB24 tablet Take 1 tablet (50 mg total) by mouth daily. Patient not taking: Reported on 08/10/2015 04/26/15   Asa Lente, MD    Physical Exam: Vitals:   10/29/15 0946 10/29/15 1039 10/29/15 1203 10/29/15 1613  BP: 143/99   159/85  Pulse: 115   112  Resp: 20   17  Temp: 97.4 F (36.3 C)  97.6 F (36.4 C)   TempSrc: Oral     SpO2: 100% 98%  98%  Weight: 54.4 kg (120 lb)     Height: 5\' 5"  (1.651 m)       Constitutional: NAD, calm, comfortable Vitals:   10/29/15 0946 10/29/15 1039 10/29/15 1203 10/29/15 1613  BP: 143/99   159/85  Pulse: 115   112  Resp: 20   17  Temp: 97.4 F (36.3 C)  97.6 F (36.4 C)   TempSrc: Oral     SpO2: 100% 98%  98%  Weight: 54.4 kg (120 lb)     Height: 5\' 5"  (1.651 m)      Eyes: PERRL, lids and conjunctivae normal ENMT: Mucous membranes are moist. Posterior pharynx clear of any exudate or lesions.Normal dentition.  Neck: normal, supple, no masses, no thyromegaly Respiratory: clear to auscultation bilaterally, no wheezing, no crackles. Normal respiratory effort. No accessory muscle use.  Cardiovascular: Regular rate and rhythm, no murmurs / rubs / gallops. No extremity edema. 2+ pedal pulses. No carotid bruits.  Abdomen: no tenderness, no masses palpated. No hepatosplenomegaly. Bowel sounds positive.  Musculoskeletal: no clubbing / cyanosis. No joint deformity upper and lower extremities. Good ROM, no contractures. Normal muscle tone.  Skin: no rashes,  lesions, ulcers. No induration Neurologic: CN 2-12 grossly intact. Sensation intact, DTR normal. Strength 5/5 in all 4.  Psychiatric: Normal judgment and insight. Alert and oriented x 3. Normal mood.   Labs on Admission: I have personally reviewed following labs and imaging studies  CBC:  Recent Labs Lab 10/29/15 1126  WBC 20.4*  NEUTROABS 16.9*  HGB 15.0  HCT 43.8  MCV 91.3  PLT 614*   Basic Metabolic Panel:  Recent Labs Lab 10/29/15 1126  NA 131*  K 4.8  CL 102  CO2 18*  GLUCOSE 102*  BUN 42*  CREATININE 0.83  CALCIUM 9.0   GFR: Estimated Creatinine Clearance: 67.3 mL/min (by C-G formula based on SCr of 0.83 mg/dL). Liver Function Tests:  Recent Labs Lab 10/29/15 1126  AST 13*  ALT 13*  ALKPHOS 90  BILITOT 0.7  PROT 7.3  ALBUMIN 3.8    Recent Labs Lab 10/29/15 1126  LIPASE 32   No results for input(s): AMMONIA in the last 168 hours. Coagulation Profile: No results for input(s): INR, PROTIME in the last 168 hours. Cardiac Enzymes: No results for  input(s): CKTOTAL, CKMB, CKMBINDEX, TROPONINI in the last 168 hours. BNP (last 3 results) No results for input(s): PROBNP in the last 8760 hours. HbA1C: No results for input(s): HGBA1C in the last 72 hours. CBG: No results for input(s): GLUCAP in the last 168 hours. Lipid Profile: No results for input(s): CHOL, HDL, LDLCALC, TRIG, CHOLHDL, LDLDIRECT in the last 72 hours. Thyroid Function Tests: No results for input(s): TSH, T4TOTAL, FREET4, T3FREE, THYROIDAB in the last 72 hours. Anemia Panel: No results for input(s): VITAMINB12, FOLATE, FERRITIN, TIBC, IRON, RETICCTPCT in the last 72 hours. Urine analysis:    Component Value Date/Time   COLORURINE RED (A) 10/29/2015 1555   APPEARANCEUR CLOUDY (A) 10/29/2015 1555   LABSPEC 1.020 10/29/2015 1555   PHURINE 5.5 10/29/2015 1555   GLUCOSEU NEGATIVE 10/29/2015 1555   HGBUR LARGE (A) 10/29/2015 1555   BILIRUBINUR MODERATE (A) 10/29/2015 1555   KETONESUR  >80 (A) 10/29/2015 1555   PROTEINUR 100 (A) 10/29/2015 1555   NITRITE POSITIVE (A) 10/29/2015 1555   LEUKOCYTESUR MODERATE (A) 10/29/2015 1555   Sepsis Labs: @LABRCNTIP (procalcitonin:4,lacticidven:4) )No results found for this or any previous visit (from the past 240 hour(s)).   Radiological Exams on Admission: Mr Brain Wo Contrast Result Date: 10/29/2015 White matter changes of multiple sclerosis of moderate severity. Mild improvement since the prior study. Several lesions appear smaller most notably a lesion in the left posterior temporal lobe. No new lesions.  Mr Cervical Spine W Wo Contrast Result Date: 10/29/2015 No acute injury in the cervical spine Improvement in cord lesions compatible with multiple sclerosis compared with 1 year ago.   Dg Chest Port 1 View Result Date: 10/29/2015 No active disease.    EKG: Independently reviewed. Not done in ED  Assessment/Plan  Principal Problem:   UTI (lower urinary tract infection) / Leukocytosis - UA on admission significant for many bacteria and too numerous to count WBC - Started on rocephin while awaiting urine culture results   Active Problems:   Multiple sclerosis (HCC) - Stable  - No new lesions on MRI    Nausea without vomiting / Weakness  - Likely due to UTI - Supportive care with IV fluids, antiemetics as needed   DVT prophylaxis: SCD's bilaterally  Code Status: full code  Family Communication: husband at the bedside  Disposition Plan: admit for treatment of UTI Consults called: none  Admission status: inpatient    Manson Passey MD [facility] Pager [phone]  If 7PM-7AM, please contact night-coverage [email or web address] Password [facility]  10/29/2015, 6:05 PM

## 2015-10-29 NOTE — ED Provider Notes (Signed)
[facility]-EMERGENCY DEPT Provider Note   CSN: [id number] Arrival date & time: 10/29/15  0940     History   Chief Complaint Chief Complaint  Patient presents with  . Nausea    HPI Glenna Fellowsatricia M Martus is a 55 y.o. female.  She describes a 6 day illness. States she's been nauseated and vomiting and unable to keep anything down. She has MS. She normally walks with assistance of a cane. Her symptoms worsen to the point that she was not able to stand this morning with the help of her husband. She fell several days ago and hurt her head and her neck.  She also has chronic vertigo. Has been taking bone 9 at home. She attributes the vertigo to her MS. Has not had any new focal numbness or weakness. Her vertigo has been worse for nausea and vomiting is worse. Does not have abdominal pain. Is incontinent at baseline. Has noticed some burning.  HPI  Past Medical History:  Diagnosis Date  . Benign paroxysmal positional vertigo 10/12/2014  . MS (multiple sclerosis) (HCC)    diagnosed 9/16    Patient Active Problem List   Diagnosis Date Noted  . Vertigo 08/10/2015  . Multiple sclerosis (HCC) 10/26/2014  . Benign paroxysmal positional vertigo 10/12/2014  . 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 Urinary frequency 10/12/2014  . Other fatigue 10/12/2014  . Numbness 10/12/2014    Past Surgical History:  Procedure Laterality Date  . OOPHORECTOMY Left    2013    OB History    No data available       Home Medications    Prior to Admission medications   Medication Sig Start Date End Date Taking? Authorizing Provider  amantadine (SYMMETREL) 100 MG capsule Take 1 capsule (100 mg total) by mouth 2 (two) times daily. 12/23/14  Yes Asa Lenteichard A Sater, MD  cholecalciferol (VITAMIN D) 1000 UNITS tablet Take 1,000 Units by mouth daily. otc vit. d 5,000iu daily/fim   Yes Historical Provider, MD  CRANBERRY PO Take 1 capsule by mouth daily.   Yes Historical Provider, MD  dalfampridine 10 MG TB12 Take 10 mg by mouth  2 (two) times daily.   Yes Historical Provider, MD  dimenhyDRINATE (DRAMAMINE) 50 MG tablet Take 50 mg by mouth every 8 (eight) hours as needed for dizziness.   Yes Historical Provider, MD  Multiple Vitamin (MULTIVITAMIN) tablet Take 1 tablet by mouth daily.   Yes Historical Provider, MD  progesterone (PROMETRIUM) 200 MG capsule Take 200 mg by mouth daily. For 10 days 10/18/15  Yes Historical Provider, MD  TECFIDERA 240 MG CPDR TAKE 1 CAPSULE BY MOUTH TWICE DAILY 10/28/15  Yes Asa Lenteichard A Sater, MD  tolterodine (DETROL LA) 4 MG 24 hr capsule Take 4 mg by mouth daily. Reported on 04/26/2015 09/14/14  Yes Historical Provider, MD  LORazepam (ATIVAN) 0.5 MG tablet Take 1 tablet (0.5 mg total) by mouth every 8 (eight) hours as needed for anxiety. Patient not taking: Reported on 04/26/2015 11/19/14   Asa Lenteichard A Sater, MD  meclizine (ANTIVERT) 25 MG tablet Take 1 tablet (25 mg total) by mouth 3 (three) times daily as needed for dizziness. Patient not taking: Reported on 08/10/2015 05/20/15   Asa Lenteichard A Sater, MD  methylPREDNISolone (MEDROL) 4 MG tablet Take 1 tablet (4 mg total) by mouth daily. Patient not taking: Reported on 08/10/2015 04/26/15   Asa Lenteichard A Sater, MD  mirabegron ER (MYRBETRIQ) 50 MG TB24 tablet Take 1 tablet (50 mg total) by mouth daily. Patient not taking:  Reported on 08/10/2015 04/26/15   Asa Lente, MD    Family History No family history on file.  Social History Social History  Substance Use Topics  . Smoking status: Never Smoker  . Smokeless tobacco: Never Used  . Alcohol use No     Allergies   Review of patient's allergies indicates no known allergies.   Review of Systems Review of Systems  Constitutional: Negative for appetite change, chills, diaphoresis, fatigue and fever.  HENT: Negative.  Negative for mouth sores, sore throat and trouble swallowing.        Headache and neck pain after a fall.  Eyes: Negative for visual disturbance.  Respiratory: Negative for cough, chest  tightness, shortness of breath and wheezing.   Cardiovascular: Negative for chest pain.  Gastrointestinal: Positive for nausea and vomiting. Negative for abdominal distention, abdominal pain and diarrhea.  Endocrine: Negative for polydipsia, polyphagia and polyuria.  Genitourinary: Positive for dysuria. Negative for frequency and hematuria.  Musculoskeletal: Negative for gait problem.  Skin: Negative for color change, pallor and rash.  Neurological: Positive for weakness. Negative for dizziness, syncope, light-headedness and headaches.  Hematological: Does not bruise/bleed easily.  Psychiatric/Behavioral: Negative for behavioral problems and confusion.     Physical Exam Updated Vital Signs BP 159/85 (BP Location: Right Arm)   Pulse 112   Temp 97.6 F (36.4 C)   Resp 17   Ht 5\' 5"  (1.651 m)   Wt 120 lb (54.4 kg)   LMP 10/25/2015   SpO2 98%   BMI 19.97 kg/m   Physical Exam  Constitutional: She is oriented to person, place, and time. No distress.  Appears ill. Heart rate 130. Eyes closed. Complaining of nausea.  HENT:  Head: Normocephalic.  Eyes: Conjunctivae are normal. Pupils are equal, round, and reactive to light. No scleral icterus.  Neck: Normal range of motion. Neck supple. No thyromegaly present.  Cardiovascular: Regular rhythm.  Tachycardia present.  Exam reveals no gallop and no friction rub.   No murmur heard. Pulmonary/Chest: Effort normal and breath sounds normal. No respiratory distress. She has no wheezes. She has no rales.  Abdominal: Soft. Bowel sounds are normal. She exhibits no distension. There is no tenderness. There is no rebound.  Musculoskeletal: Normal range of motion.  Neurological: She is alert and oriented to person, place, and time.  No obvious cranial nerve deficits. Complains of dizziness with lateral to lateral gaze but no nystagmus. No pronator drift. No leg drift.  Skin: Skin is warm and dry. No rash noted.  Psychiatric: She has a normal mood  and affect. Her behavior is normal.     ED Treatments / Results  Labs (all labs ordered are listed, but only abnormal results are displayed) Labs Reviewed  CBC WITH DIFFERENTIAL/PLATELET - Abnormal; Notable for the following:       Result Value   WBC 20.4 (*)    Platelets 614 (*)    Neutro Abs 16.9 (*)    Monocytes Absolute 1.4 (*)    All other components within normal limits  COMPREHENSIVE METABOLIC PANEL - Abnormal; Notable for the following:    Sodium 131 (*)    CO2 18 (*)    Glucose, Bld 102 (*)    BUN 42 (*)    AST 13 (*)    ALT 13 (*)    All other components within normal limits  URINALYSIS, ROUTINE W REFLEX MICROSCOPIC (NOT AT [facility]) - Abnormal; Notable for the following:    Color, Urine RED (*)  APPearance CLOUDY (*)    Hgb urine dipstick LARGE (*)    Bilirubin Urine MODERATE (*)    Ketones, ur >80 (*)    Protein, ur 100 (*)    Nitrite POSITIVE (*)    Leukocytes, UA MODERATE (*)    All other components within normal limits  URINE MICROSCOPIC-ADD ON - Abnormal; Notable for the following:    Squamous Epithelial / LPF 6-30 (*)    Bacteria, UA MANY (*)    All other components within normal limits  CULTURE, BLOOD (ROUTINE X 2)  CULTURE, BLOOD (ROUTINE X 2)  LIPASE, BLOOD  I-STAT CG4 LACTIC ACID, ED    EKG  EKG Interpretation None       Radiology Mr Brain Wo Contrast  Result Date: 10/29/2015 CLINICAL DATA:  Multiple sclerosis exacerbation.  Vertigo, fall EXAM: MRI HEAD WITHOUT CONTRAST TECHNIQUE: Multiplanar, multiecho pulse sequences of the brain and surrounding structures were obtained without intravenous contrast. COMPARISON:  MRI head 05/18/2015 FINDINGS: Periventricular and deep white matter hyperintensities bilaterally compatible with multiple sclerosis. Mild improvement in white matter lesions since the prior study. Most notably, the large lesion in the left temporal periventricular white matter is smaller. Other lesions are smaller as well.  Hyperintensity in the left posterior lateral cerebellum is unchanged and may be due to demyelinating disease. Subtle lesion right pons probably due to multiple sclerosis. Lesions in the midbrain, pons and cervical medullary junction better seen on sagittal T2 images of the cervical spine and consistent with multiple sclerosis. No new lesions. No lesion show restricted diffusion. Intravenous contrast not administered. Mild T2 shine through right frontal parietal white matter. Negative for intracranial hemorrhage. Negative for mass or edema. No shift of the midline structures. Ventricle size normal.  Cerebral volume normal. IMPRESSION: White matter changes of multiple sclerosis of moderate severity. Mild improvement since the prior study. Several lesions appear smaller most notably a lesion in the left posterior temporal lobe. No new lesions. Electronically Signed   By: Marlan Palau M.D.   On: 10/29/2015 16:05   Mr Cervical Spine W Wo Contrast  Result Date: 10/29/2015 CLINICAL DATA:  Multiple sclerosis exacerbation.  Vertigo and fall. EXAM: MRI CERVICAL SPINE WITHOUT AND WITH CONTRAST TECHNIQUE: Multiplanar and multiecho pulse sequences of the cervical spine, to include the craniocervical junction and cervicothoracic junction, were obtained according to standard protocol without and with intravenous contrast. CONTRAST:  11 mL MultiHance IV COMPARISON:  Cervical MRI 10/20/2014 FINDINGS: Normal cervical alignment. Negative for fracture or mass. Normal bone marrow. Normal spinal canal diameter. Hyperintense lesions in the midbrain and pons unchanged. Lesion at the cervical medullary junction improved. Anterior cord lesion at the C4 level improved. Ill-defined lesion at C7-T1 improved. No new cord lesions. Cord lesions do not show abnormal enhancement Hyperintensity left lateral cerebellum unchanged. Negative for disc protrusion or spinal stenosis. IMPRESSION: No acute injury in the cervical spine Improvement in  cord lesions compatible with multiple sclerosis compared with 1 year ago. Electronically Signed   By: Marlan Palau M.D.   On: 10/29/2015 16:14   Dg Chest Port 1 View  Result Date: 10/29/2015 CLINICAL DATA:  Chest soreness EXAM: PORTABLE CHEST 1 VIEW COMPARISON:  None. FINDINGS: The heart size and mediastinal contours are within normal limits. Both lungs are clear. The visualized skeletal structures are unremarkable. IMPRESSION: No active disease. Electronically Signed   By: Signa Kell M.D.   On: 10/29/2015 12:31    Procedures Procedures (including critical care time)  Medications Ordered in ED  Medications  meclizine (ANTIVERT) tablet 25 mg (25 mg Oral Not Given 10/29/15 1126)  diphenhydrAMINE (BENADRYL) injection 25 mg (25 mg Intravenous Given 10/29/15 1125)  ondansetron (ZOFRAN) 8 mg in sodium chloride 0.9 % 50 mL IVPB (8 mg Intravenous Given 10/29/15 1136)  sodium chloride 0.9 % bolus 2,000 mL (0 mLs Intravenous Stopped 10/29/15 1617)  gadobenate dimeglumine (MULTIHANCE) injection 11 mL (11 mLs Intravenous Contrast Given 10/29/15 1545)     Initial Impression / Assessment and Plan / ED Course  I have reviewed the triage vital signs and the nursing notes.  Pertinent labs & imaging results that were available during my care of the patient were reviewed by me and considered in my medical decision making (see chart for details).  Clinical Course    IV placed. Labs obtained. Given IV fluids 2 L. Was given IV Benadryl. Was given some by mouth meclizine. Patient had a coughing episode. She states that she feels like when she swallows things are "hanging up". I later observed her drinking. She feels more like "it's just very irritated". He is very likely esophagitis related to her week of vomiting. She does not have neurological symptoms. I do not think she needs to be nothing by mouth. She states she can take little sips at a time. Think she will require admission. She cannot stand  independently. Remains nauseated tachycardic at 105. She has marked leukocytosis and is still weak. Surprisingly, has normal renal function and electrolytes. Await urine, lactate, and blood cultures.  MRI obtained shows no new lesions. Her old CNS and cervical spine lesions are unchanged or smaller versus most recent comparison.  Final Clinical Impressions(s) / ED Diagnoses   Final diagnoses:  MS (multiple sclerosis) (HCC)    New Prescriptions New Prescriptions   No medications on file     Rolland Porter, MD 10/29/15 1719

## 2015-10-29 NOTE — ED Notes (Signed)
PER DR. Fayrene FearingJAMES. PT MAY HAVE A REGULAR DIET AND LIQUIDS AS NEEDED.

## 2015-10-29 NOTE — ED Notes (Signed)
Bed: WA22 Expected date:  Expected time:  Means of arrival:  Comments: EMS-N/V 

## 2015-10-30 DIAGNOSIS — E869 Volume depletion, unspecified: Secondary | ICD-10-CM

## 2015-10-30 NOTE — Progress Notes (Signed)
PROGRESS NOTE    Crystal Park  ZOX:[id number] DOB: 02-19-1961 DOA: 10/29/2015 PCP:  Duane Lope, MD  Outpatient Specialists:   Brief Narrative: 55 y.o. female with medical history significant for MS, gait imbalance who presented from home to [facility] ED with worsening weakness, nausea and vomiting over past few days prior to this admission. She felt weak and fatigued. Patient reports no associated abdominal pain, no fevers or chills. No diarrhea or constipation. No reports of chest pain, shortness of breath or palpitations.   ED Course: In ED, pt was hemodynamically stable. She was very nauseous  nd did not tolerate much PO intake. Her WBC count was 20.4, sodium 131, glucose 102. UA was significant for many bacteria and too numerous to count WBC. She was admitted for further management of UTI. Rocephin started in ED.  Assessment & Plan:   Principal Problem:   UTI (lower urinary tract infection) Active Problems:   Multiple sclerosis (HCC)   Leukocytosis   Nausea without vomiting  Principal Problem:   UTI (lower urinary tract infection) / Leukocytosis - Follow urine cultures. Continue IV antibiotics.  Active Problems:   Multiple sclerosis (HCC) - Stable  - No new lesions on MRI    Nausea without vomiting / Weakness  - Resolved significantly. - Continue supportive care.  Volume depletion - Continue antibiotics.  Weakness - likely multifactorial (see above).  DVT prophylaxis: SCD's bilaterally  Code Status: full code  Family Communication: husband at the bedside  Disposition Plan: admit for treatment of UTI Consults called: none  Admission status: inpatient    Procedures:   Noone  Antimicrobials:   Rocephin    Subjective: Nil new complaints. Still feeling weak. No fever or chills. No SOB. No chest pain. Nausea and vomiting have resolved significantly.  Objective: Vitals:   10/29/15 1939 10/29/15 2023 10/30/15 0510 10/30/15 0530  BP: 136/84 (!) 151/84 128/87    Pulse: 106 (!) 115 (!) 106   Resp: 16 16 15    Temp: 98 F (36.7 C) 98.1 F (36.7 C) 97.8 F (36.6 C)   TempSrc: Oral Axillary Oral   SpO2: 100% 100% 100%   Weight:  54.4 kg (120 lb)  57.7 kg (127 lb 3.2 oz)  Height:  5\' 5"  (1.651 m)      Intake/Output Summary (Last 24 hours) at 10/30/15 1112 Last data filed at 10/30/15 0606  Gross per 24 hour  Intake             1060 ml  Output              250 ml  Net              810 ml   Filed Weights   10/29/15 0946 10/29/15 2023 10/30/15 0530  Weight: 54.4 kg (120 lb) 54.4 kg (120 lb) 57.7 kg (127 lb 3.2 oz)    Examination:  General exam: Appears calm and comfortable. Dry buccal mucosa. Respiratory system: Clear to auscultation.   Cardiovascular system: S1 & S2. Gastrointestinal system: Abdomen is nondistended, soft and nontender.   Central nervous system: Alert and oriented. Weakness both upper limbs. No movement of lower extremities.  Data Reviewed: I have personally reviewed following labs and imaging studies  CBC:  Recent Labs Lab 10/29/15 1126 10/29/15 2053  WBC 20.4* 21.6*  NEUTROABS 16.9*  --   HGB 15.0 13.9  HCT 43.8 40.7  MCV 91.3 90.4  PLT 614* 582*   Basic Metabolic Panel:  Recent Labs Lab 10/29/15  1126 10/29/15 2053  NA 131* 138  K 4.8 4.2  CL 102 106  CO2 18* 23  GLUCOSE 102* 109*  BUN 42* 25*  CREATININE 0.83 0.70  CALCIUM 9.0 8.8*  MG  --  2.0  PHOS  --  2.4*   GFR: Estimated Creatinine Clearance: 73.2 mL/min (by C-G formula based on SCr of 0.8 mg/dL). Liver Function Tests:  Recent Labs Lab 10/29/15 1126  AST 13*  ALT 13*  ALKPHOS 90  BILITOT 0.7  PROT 7.3  ALBUMIN 3.8    Recent Labs Lab 10/29/15 1126  LIPASE 32   No results for input(s): AMMONIA in the last 168 hours. Coagulation Profile: No results for input(s): INR, PROTIME in the last 168 hours. Cardiac Enzymes: No results for input(s): CKTOTAL, CKMB, CKMBINDEX, TROPONINI in the last 168 hours. BNP (last 3 results) No  results for input(s): PROBNP in the last 8760 hours. HbA1C: No results for input(s): HGBA1C in the last 72 hours. CBG: No results for input(s): GLUCAP in the last 168 hours. Lipid Profile: No results for input(s): CHOL, HDL, LDLCALC, TRIG, CHOLHDL, LDLDIRECT in the last 72 hours. Thyroid Function Tests: No results for input(s): TSH, T4TOTAL, FREET4, T3FREE, THYROIDAB in the last 72 hours. Anemia Panel: No results for input(s): VITAMINB12, FOLATE, FERRITIN, TIBC, IRON, RETICCTPCT in the last 72 hours. Urine analysis:    Component Value Date/Time   COLORURINE RED (A) 10/29/2015 1555   APPEARANCEUR CLOUDY (A) 10/29/2015 1555   LABSPEC 1.020 10/29/2015 1555   PHURINE 5.5 10/29/2015 1555   GLUCOSEU NEGATIVE 10/29/2015 1555   HGBUR LARGE (A) 10/29/2015 1555   BILIRUBINUR MODERATE (A) 10/29/2015 1555   KETONESUR >80 (A) 10/29/2015 1555   PROTEINUR 100 (A) 10/29/2015 1555   NITRITE POSITIVE (A) 10/29/2015 1555   LEUKOCYTESUR MODERATE (A) 10/29/2015 1555   Sepsis Labs: @LABRCNTIP (procalcitonin:4,lacticidven:4)  )No results found for this or any previous visit (from the past 240 hour(s)).       Radiology Studies: Mr Brain Wo Contrast  Result Date: 10/29/2015 CLINICAL DATA:  Multiple sclerosis exacerbation.  Vertigo, fall EXAM: MRI HEAD WITHOUT CONTRAST TECHNIQUE: Multiplanar, multiecho pulse sequences of the brain and surrounding structures were obtained without intravenous contrast. COMPARISON:  MRI head 05/18/2015 FINDINGS: Periventricular and deep white matter hyperintensities bilaterally compatible with multiple sclerosis. Mild improvement in white matter lesions since the prior study. Most notably, the large lesion in the left temporal periventricular white matter is smaller. Other lesions are smaller as well. Hyperintensity in the left posterior lateral cerebellum is unchanged and may be due to demyelinating disease. Subtle lesion right pons probably due to multiple sclerosis.  Lesions in the midbrain, pons and cervical medullary junction better seen on sagittal T2 images of the cervical spine and consistent with multiple sclerosis. No new lesions. No lesion show restricted diffusion. Intravenous contrast not administered. Mild T2 shine through right frontal parietal white matter. Negative for intracranial hemorrhage. Negative for mass or edema. No shift of the midline structures. Ventricle size normal.  Cerebral volume normal. IMPRESSION: White matter changes of multiple sclerosis of moderate severity. Mild improvement since the prior study. Several lesions appear smaller most notably a lesion in the left posterior temporal lobe. No new lesions. Electronically Signed   By: Marlan Palau M.D.   On: 10/29/2015 16:05   Mr Cervical Spine W Wo Contrast  Result Date: 10/29/2015 CLINICAL DATA:  Multiple sclerosis exacerbation.  Vertigo and fall. EXAM: MRI CERVICAL SPINE WITHOUT AND WITH CONTRAST TECHNIQUE: Multiplanar and multiecho pulse  sequences of the cervical spine, to include the craniocervical junction and cervicothoracic junction, were obtained according to standard protocol without and with intravenous contrast. CONTRAST:  11 mL MultiHance IV COMPARISON:  Cervical MRI 10/20/2014 FINDINGS: Normal cervical alignment. Negative for fracture or mass. Normal bone marrow. Normal spinal canal diameter. Hyperintense lesions in the midbrain and pons unchanged. Lesion at the cervical medullary junction improved. Anterior cord lesion at the C4 level improved. Ill-defined lesion at C7-T1 improved. No new cord lesions. Cord lesions do not show abnormal enhancement Hyperintensity left lateral cerebellum unchanged. Negative for disc protrusion or spinal stenosis. IMPRESSION: No acute injury in the cervical spine Improvement in cord lesions compatible with multiple sclerosis compared with 1 year ago. Electronically Signed   By: Marlan Palauharles  Clark M.D.   On: 10/29/2015 16:14   Dg Chest Port 1  View  Result Date: 10/29/2015 CLINICAL DATA:  Chest soreness EXAM: PORTABLE CHEST 1 VIEW COMPARISON:  None. FINDINGS: The heart size and mediastinal contours are within normal limits. Both lungs are clear. The visualized skeletal structures are unremarkable. IMPRESSION: No active disease. Electronically Signed   By: Signa Kellaylor  Stroud M.D.   On: 10/29/2015 12:31        Scheduled Meds: . amantadine  100 mg Oral BID  . cefTRIAXone (ROCEPHIN)  IV  1 g Intravenous Q24H  . cholecalciferol  1,000 Units Oral Daily  . dalfampridine  10 mg Oral BID  . Dimethyl Fumarate  1 capsule Oral BID  . fesoterodine  4 mg Oral Daily  . multivitamin with minerals  1 tablet Oral Daily   Continuous Infusions: . sodium chloride 75 mL/hr at 10/30/15 0606     LOS: 1 day    Time spent: Greater than 30 Minutes.    Berton MountSylvester Shenaya Lebo, MD  [facility] Pager #: [phone] 7PM-7AM contact night coverage as above

## 2015-10-31 DIAGNOSIS — E860 Dehydration: Secondary | ICD-10-CM

## 2015-10-31 LAB — RENAL FUNCTION PANEL
Albumin: 2.9 g/dL — ABNORMAL LOW (ref 3.5–5.0)
Anion gap: 4 — ABNORMAL LOW (ref 5–15)
BUN: 21 mg/dL — ABNORMAL HIGH (ref 6–20)
CO2: 27 mmol/L (ref 22–32)
Calcium: 8.3 mg/dL — ABNORMAL LOW (ref 8.9–10.3)
Chloride: 107 mmol/L (ref 101–111)
Creatinine, Ser: 0.79 mg/dL (ref 0.44–1.00)
GFR calc Af Amer: >60 mL/min (ref >60)
GFR calc non Af Amer: >60 mL/min (ref >60)
Glucose, Bld: 107 mg/dL — ABNORMAL HIGH (ref 65–99)
Phosphorus: 2.7 mg/dL (ref 2.5–4.6)
Potassium: 3.9 mmol/L (ref 3.5–5.1)
Sodium: 138 mmol/L (ref 135–145)

## 2015-10-31 LAB — CBC WITH DIFFERENTIAL/PLATELET
Basophils Absolute: 0.1 10*3/uL (ref 0.0–0.1)
Basophils Relative: 1 %
Eosinophils Absolute: 0.5 10*3/uL (ref 0.0–0.7)
Eosinophils Relative: 4 %
HCT: 34.5 % — ABNORMAL LOW (ref 36.0–46.0)
Hemoglobin: 11.7 g/dL — ABNORMAL LOW (ref 12.0–15.0)
Lymphocytes Relative: 12 %
Lymphs Abs: 1.5 10*3/uL (ref 0.7–4.0)
MCH: 31.8 pg (ref 26.0–34.0)
MCHC: 33.9 g/dL (ref 30.0–36.0)
MCV: 93.8 fL (ref 78.0–100.0)
Monocytes Absolute: 2 10*3/uL — ABNORMAL HIGH (ref 0.1–1.0)
Monocytes Relative: 16 %
Neutro Abs: 8.5 10*3/uL — ABNORMAL HIGH (ref 1.7–7.7)
Neutrophils Relative %: 67 %
Platelets: 511 10*3/uL — ABNORMAL HIGH (ref 150–400)
RBC: 3.68 MIL/uL — ABNORMAL LOW (ref 3.87–5.11)
RDW: 14.4 % (ref 11.5–15.5)
WBC: 12.6 10*3/uL — ABNORMAL HIGH (ref 4.0–10.5)

## 2015-10-31 LAB — MAGNESIUM: Magnesium: 1.8 mg/dL (ref 1.7–2.4)

## 2015-10-31 NOTE — Evaluation (Signed)
Physical Therapy Evaluation [name] MRN: [id number] DOB: 16-Nov-1960 Today's Date: 10/31/2015   History of Present Illness  55 y.o. female with medical history significant for MS, gait imbalance who presented from home to [facility] ED with worsening weakness, nausea and vomiting over past few days prior to this admission. She felt weak and fatigued.  Found to have UTI  Clinical Impression  Pt admitted with above diagnosis. Pt currently with functional limitations due to the deficits listed below (see PT Problem List).  Pt will benefit from skilled PT to increase their independence and safety with mobility to allow discharge to the venue listed below.       Follow Up Recommendations Home health PT;Supervision for mobility/OOB    Equipment Recommendations  Rolling walker with 5" wheels    Recommendations for Other Services       Precautions / Restrictions Precautions Precautions: Fall Precaution Comments: recent fall at home injuring head/neck per patient Restrictions Weight Bearing Restrictions: No      Mobility  Bed Mobility Overal bed mobility: Needs Assistance Bed Mobility: Supine to Sit     Supine to sit: Min assist;+2 for safety/equipment;HOB elevated        Transfers Overall transfer level: Needs assistance Equipment used: Rolling walker (2 wheeled) Transfers: Sit to/from Stand Sit to Stand: Min assist;+2 safety/equipment;From elevated surface Stand pivot transfers: Min guard;Min assist       General transfer comment: cues for hand placement during transfers, incr time assist for balance during transition to/from walker  Ambulation/Gait Ambulation/Gait assistance: Min assist;+2 safety/equipment Ambulation Distance (Feet): 4 Feet (pivotal steps to [facility]) Assistive device: Rolling walker (2 wheeled) Gait Pattern/deviations: Step-through pattern;Decreased stride length;Trunk flexed     General Gait Details: safety cues  Stairs             Wheelchair Mobility    Modified Rankin (Stroke Patients Only)       Balance Overall balance assessment: Needs assistance   Sitting balance-Leahy Scale: Good Sitting balance - Comments: close guarding for balance and safety/assist with donning undergarment     Standing balance-Leahy Scale: Poor Standing balance comment: reliant on UEs                             Pertinent Vitals/Pain Pain Assessment: Faces Faces Pain Scale: Hurts little more Pain Location: neck Pain Descriptors / Indicators: Sore Pain Intervention(s): Limited activity within patient's tolerance;Monitored during session;Ice applied;Repositioned    Home Living Family/patient expects to be discharged to:: Private residence Living Arrangements: Spouse/significant other Available Help at Discharge: Family Type of Home: House       Home Layout: Two level;Bed/bath upstairs Home Equipment: Gilmer Mor - single point;Shower seat      Prior Function Level of Independence: Independent with assistive device(s)         Comments: independent household ambulation with cane, admits to frequent furniture walking, stays "near walls",  independent with BADLs.     Hand Dominance   Dominant Hand: Right    Extremity/Trunk Assessment   Upper Extremity Assessment: Defer to OT evaluation;Generalized weakness           Lower Extremity Assessment: Generalized weakness      Cervical / Trunk Assessment: Kyphotic  Communication   Communication: No difficulties  Cognition Arousal/Alertness: Awake/alert Behavior During Therapy: WFL for tasks assessed/performed Overall Cognitive Status:  (slow processing, decr initiation)  General Comments      Exercises        Assessment/Plan    PT Assessment Patient needs continued PT services  PT Diagnosis Difficulty walking   PT Problem List Decreased strength;Decreased activity tolerance;Decreased balance;Decreased  mobility;Decreased knowledge of use of DME  PT Treatment Interventions DME instruction;Gait training;Functional mobility training;Stair training;Therapeutic exercise;Therapeutic activities;Balance training   PT Goals (Current goals can be found in the Care Plan section) Acute Rehab PT Goals Patient Stated Goal: to go home PT Goal Formulation: With patient Time For Goal Achievement: 11/07/15 Potential to Achieve Goals: Good    Frequency Min 3X/week   Barriers to discharge        Co-evaluation   Reason for Co-Treatment: For patient/therapist safety PT goals addressed during session: Mobility/safety with mobility OT goals addressed during session: ADL's and self-care       End of Session Equipment Utilized During Treatment: Gait belt Activity Tolerance: Patient tolerated treatment well Patient left: with call bell/phone within reach;in chair;with chair alarm set;with family/visitor present Nurse Communication: Mobility status         Time: [id number] PT Time Calculation (min) (ACUTE ONLY): 31 min   Charges:   PT Evaluation $PT Eval Moderate Complexity: 1 Procedure     PT G Codes:        Crystal Park 10/31/2015, 1:43 PM

## 2015-10-31 NOTE — Progress Notes (Signed)
PROGRESS NOTE    Crystal Park  ZOX:[id number] DOB: June 05, 1960 DOA: 10/29/2015 PCP:  Duane Lope, MD  Outpatient Specialists:   Brief Narrative: 55 y.o. female with medical history significant for MS, gait imbalance who presented from home to [facility] ED with worsening weakness, nausea and vomiting over past few days prior to this admission. She felt weak and fatigued. Patient reports no associated abdominal pain, no fevers or chills. No diarrhea or constipation. No reports of chest pain, shortness of breath or palpitations.   ED Course: In ED, pt was hemodynamically stable. She was very nauseous  nd did not tolerate much PO intake. Her WBC count was 20.4, sodium 131, glucose 102. UA was significant for many bacteria and too numerous to count WBC. She was admitted for further management of UTI. Rocephin started in ED.  Assessment & Plan:   Principal Problem:   UTI (lower urinary tract infection) Active Problems:   Multiple sclerosis (HCC)   Leukocytosis   Nausea without vomiting  Principal Problem:   UTI (lower urinary tract infection) / Leukocytosis - Follow urine cultures. Preliminary urine culture is growing GNR 60,000 colonies. Continue IV antibiotics.  Active Problems:   Multiple sclerosis (HCC) - Stable  - No new lesions on MRI    Nausea without vomiting / Weakness  - Resolved. - Continue supportive care.  Volume depletion - Continue IVF.  Weakness - likely multifactorial. Improving (see above). PT/OT as patient may need short term rehab  DVT prophylaxis: SCD's bilaterally  Code Status: full code  Family Communication: husband at the bedside  Disposition Plan: admit for treatment of UTI Consults called: none  Admission status: inpatient    Procedures:   Noone  Antimicrobials:   Rocephin    Subjective: Nil new complaints. Feels better today.  Objective: Vitals:   10/30/15 1532 10/30/15 2149 10/31/15 0618 10/31/15 1430  BP: 129/74 118/74 124/76 120/74   Pulse: 87 (!) 117 92 (!) 115  Resp: 16 14 14 14   Temp: 97.8 F (36.6 C) 98.2 F (36.8 C) 98 F (36.7 C) 98 F (36.7 C)  TempSrc: Oral Oral Oral Oral  SpO2: 100% 100% 100% 100%  Weight:      Height:        Intake/Output Summary (Last 24 hours) at 10/31/15 1608 Last data filed at 10/31/15 1431  Gross per 24 hour  Intake             3285 ml  Output                0 ml  Net             3285 ml   Filed Weights   10/29/15 0946 10/29/15 2023 10/30/15 0530  Weight: 54.4 kg (120 lb) 54.4 kg (120 lb) 57.7 kg (127 lb 3.2 oz)    Examination:  General exam: Appears calm and comfortable. Dry buccal mucosa. Respiratory system: Clear to auscultation.   Cardiovascular system: S1 & S2. Gastrointestinal system: Abdomen is nondistended, soft and nontender.   Central nervous system: Alert and oriented. Weakness both upper limbs. No movement of lower extremities.  Data Reviewed: I have personally reviewed following labs and imaging studies  CBC:  Recent Labs Lab 10/29/15 1126 10/29/15 2053 10/31/15 0415  WBC 20.4* 21.6* 12.6*  NEUTROABS 16.9*  --  8.5*  HGB 15.0 13.9 11.7*  HCT 43.8 40.7 34.5*  MCV 91.3 90.4 93.8  PLT 614* 582* 511*   Basic Metabolic Panel:  Recent Labs Lab  10/29/15 1126 10/29/15 2053 10/31/15 0415  NA 131* 138 138  K 4.8 4.2 3.9  CL 102 106 107  CO2 18* 23 27  GLUCOSE 102* 109* 107*  BUN 42* 25* 21*  CREATININE 0.83 0.70 0.79  CALCIUM 9.0 8.8* 8.3*  MG  --  2.0 1.8  PHOS  --  2.4* 2.7   GFR: Estimated Creatinine Clearance: 73.2 mL/min (by C-G formula based on SCr of 0.8 mg/dL). Liver Function Tests:  Recent Labs Lab 10/29/15 1126 10/31/15 0415  AST 13*  --   ALT 13*  --   ALKPHOS 90  --   BILITOT 0.7  --   PROT 7.3  --   ALBUMIN 3.8 2.9*    Recent Labs Lab 10/29/15 1126  LIPASE 32   No results for input(s): AMMONIA in the last 168 hours. Coagulation Profile: No results for input(s): INR, PROTIME in the last 168 hours. Cardiac  Enzymes: No results for input(s): CKTOTAL, CKMB, CKMBINDEX, TROPONINI in the last 168 hours. BNP (last 3 results) No results for input(s): PROBNP in the last 8760 hours. HbA1C: No results for input(s): HGBA1C in the last 72 hours. CBG: No results for input(s): GLUCAP in the last 168 hours. Lipid Profile: No results for input(s): CHOL, HDL, LDLCALC, TRIG, CHOLHDL, LDLDIRECT in the last 72 hours. Thyroid Function Tests: No results for input(s): TSH, T4TOTAL, FREET4, T3FREE, THYROIDAB in the last 72 hours. Anemia Panel: No results for input(s): VITAMINB12, FOLATE, FERRITIN, TIBC, IRON, RETICCTPCT in the last 72 hours. Urine analysis:    Component Value Date/Time   COLORURINE RED (A) 10/29/2015 1555   APPEARANCEUR CLOUDY (A) 10/29/2015 1555   LABSPEC 1.020 10/29/2015 1555   PHURINE 5.5 10/29/2015 1555   GLUCOSEU NEGATIVE 10/29/2015 1555   HGBUR LARGE (A) 10/29/2015 1555   BILIRUBINUR MODERATE (A) 10/29/2015 1555   KETONESUR >80 (A) 10/29/2015 1555   PROTEINUR 100 (A) 10/29/2015 1555   NITRITE POSITIVE (A) 10/29/2015 1555   LEUKOCYTESUR MODERATE (A) 10/29/2015 1555   Sepsis Labs: @LABRCNTIP (procalcitonin:4,lacticidven:4)  ) Recent Results (from the past 240 hour(s))  Urine culture     Status: Abnormal (Preliminary result)   Collection Time: 10/29/15  4:25 PM  Result Value Ref Range Status   Specimen Description URINE, RANDOM  Final   Special Requests NONE  Final   Culture 60,000 COLONIES/mL GRAM NEGATIVE RODS (A)  Final   Report Status PENDING  Incomplete  Culture, blood (Routine X 2) w Reflex to ID Panel     Status: None (Preliminary result)   Collection Time: 10/29/15  5:23 PM  Result Value Ref Range Status   Specimen Description BLOOD RIGHT AC  Final   Special Requests BOTTLES DRAWN AEROBIC AND ANAEROBIC 5CC  Final   Culture   Final    NO GROWTH < 24 HOURS [facility]    Report Status PENDING  Incomplete  Culture, blood (Routine X 2) w Reflex to  ID Panel     Status: None (Preliminary result)   Collection Time: 10/29/15  5:23 PM  Result Value Ref Range Status   Specimen Description [facility]OD RIGHT [facility]  Final   Special Requests BOTTLES DRAWN AEROBIC AND ANAEROBIC 5CC  Final   Culture   Final    NO GROWTH < 24 HOURS [facility]    Report Status PENDING  Incomplete         Radiology Studies: No results found.      Scheduled Meds: . amantadine  100 mg Oral BID  . cefTRIAXone (ROCEPHIN)  IV  1 g Intravenous Q24H  . cholecalciferol  1,000 Units Oral Daily  . dalfampridine  10 mg Oral BID  . Dimethyl Fumarate  1 capsule Oral BID  . fesoterodine  4 mg Oral Daily  . multivitamin with minerals  1 tablet Oral Daily   Continuous Infusions: . sodium chloride 75 mL/hr (10/31/15 1543)     LOS: 2 days    Time spent: Greater than 30 Minutes.    Berton Mount, MD  [facility] Pager #: [phone] 7PM-7AM contact night coverage as above

## 2015-10-31 NOTE — Progress Notes (Signed)
Occupational Therapy Evaluation [name] MRN: [id number] DOB: Sep 17, 1960 Today's Date: 10/31/2015    History of Present Illness 55 y.o. female with medical history significant for MS, gait imbalance who [facility] with worsening weakness, nausea and vomiting over past few days prior to this admission. She felt weak and fatigued.  Found to have UTI   Clinical Impression   Patient presents to OT with decreased ADL independence and safety due to the deficits listed below. She will benefit from skilled OT to maximize function and to facilitate a safe discharge to the venue listed below. OT will follow.    Follow Up Recommendations  Home health OT;Supervision/Assistance - 24 hour    Equipment Recommendations  3 in 1 bedside comode    Recommendations for Other Services       Precautions / Restrictions Precautions Precautions: Fall Precaution Comments: recent fall at home injuring head/neck per patient Restrictions Weight Bearing Restrictions: No      Mobility Bed Mobility Overal bed mobility: Needs Assistance Bed Mobility: Supine to Sit     Supine to sit: Min assist;+2 for safety/equipment;HOB elevated        Transfers Overall transfer level: Needs assistance Equipment used: Rolling walker (2 wheeled) Transfers: Sit to/from [employer]GI CorporationStand;Stand Pivot Transfers Sit to Stand: Min assist;+2 safety/equipment;From elevated surface Stand pivot transfers: Min guard;Min assist       General transfer comment: cues for hand placement during transfers    Balance                                            ADL Overall ADL's : Needs assistance/impaired Eating/Feeding: Modified independent;Sitting Eating/Feeding Details (indicate cue type and reason): drink from water bottle Grooming: Minimal assistance;Sitting Grooming Details (indicate cue type and reason): apply lip moisturizer     Lower Body Bathing: Total  assistance;Sit to/from stand       Lower Body Dressing: Maximal assistance;Sit to/from stand Lower Body Dressing Details (indicate cue type and reason): don brief Toilet Transfer: Minimal assistance;+2 for safety/equipment;Stand-pivot;RW;BSC   Toileting- Clothing Manipulation and Hygiene: Maximal assistance;Total assistance;Sit to/from stand       Functional mobility during ADLs: Minimal assistance;+2 for safety/equipment;Rolling walker General ADL Comments: Patient practiced BSC transfer via RW, then up to recliner. Donned brief. Husband works from home and states he can assist patient athome as needed. Recommend 3 in 1 for home use.     Vision     Perception     Praxis      Pertinent Vitals/Pain Pain Assessment: Faces Faces Pain Scale: Hurts little more Pain Location: neck Pain Descriptors / Indicators: Sore Pain Intervention(s): Limited activity within patient's tolerance;Monitored during session;Repositioned;Ice applied     Hand Dominance Right   Extremity/Trunk Assessment Upper Extremity Assessment Upper Extremity Assessment: Generalized weakness   Lower Extremity Assessment Lower Extremity Assessment: Defer to PT evaluation   Cervical / Trunk Assessment Cervical / Trunk Assessment: Kyphotic   Communication Communication Communication: No difficulties   Cognition Arousal/Alertness: Awake/alert Behavior During Therapy: WFL for tasks assessed/performed Overall Cognitive Status: Within Functional Limits for tasks assessed                     General Comments       Exercises       Shoulder Instructions      Home Living Family/patient  expects to be discharged to:: Private residence Living Arrangements: Spouse/significant other Available Help at Discharge: Family Type of Home: House       Home Layout: Two level;Bed/bath upstairs     Bathroom Shower/Tub: Producer, television/film/video: Standard (chair height toilet) Bathroom  Accessibility: Yes   Home Equipment: Cane - single point;Shower seat          Prior Functioning/Environment Level of Independence: Independent with assistive device(s)        Comments: independent household ambulation with cane, independent with BADLs.    OT Diagnosis: Generalized weakness   OT Problem List: Decreased strength;Decreased activity tolerance;Impaired balance (sitting and/or standing);Decreased knowledge of use of DME or AE;Pain   OT Treatment/Interventions: Self-care/ADL training;DME and/or AE instruction;Therapeutic activities;Patient/family education    OT Goals(Current goals can be found in the care plan section) Acute Rehab OT Goals Patient Stated Goal: to go home OT Goal Formulation: With patient Time For Goal Achievement: 11/14/15 Potential to Achieve Goals: Good ADL Goals Pt Will Perform Upper Body Bathing: with set-up;sitting Pt Will Perform Lower Body Bathing: with min assist;sit to/from stand Pt Will Perform Upper Body Dressing: with set-up;sitting Pt Will Perform Lower Body Dressing: with min assist;sit to/from stand Pt Will Transfer to Toilet: with supervision;bedside commode;ambulating Pt Will Perform Toileting - Clothing Manipulation and hygiene: with supervision;sit to/from stand  OT Frequency: Min 2X/week   Barriers to D/C:            Co-evaluation PT/OT/SLP Co-Evaluation/Treatment: Yes Reason for Co-Treatment: For patient/therapist safety PT goals addressed during session: Mobility/safety with mobility OT goals addressed during session: ADL's and self-care      End of Session Equipment Utilized During Treatment: Rolling walker Nurse Communication: Mobility status  Activity Tolerance: Patient tolerated treatment well Patient left: in chair;with call bell/phone within reach;with chair alarm set;with family/visitor present   Time: 1610-9604 OT Time Calculation (min): 30 min Charges:  OT General Charges $OT Visit: 1 Procedure OT  Evaluation $OT Eval Moderate Complexity: 1 Procedure G-Codes:    Lania Zawistowski A 2015/11/25, 1:16 PM

## 2015-10-31 NOTE — Progress Notes (Signed)
Discharge planning, spoke with patient and spouse at beside. Chose [facility] for [facility] services, contacted [facility] for referral. They have accepted patient. Needs RW and 3-n-1, contacted [facility] to deliver to room. [phone]

## 2015-11-01 ENCOUNTER — Telehealth: Payer: Self-pay | Admitting: Neurology

## 2015-11-01 LAB — CBC WITH DIFFERENTIAL/PLATELET
Basophils Absolute: 0.1 10*3/uL (ref 0.0–0.1)
Basophils Relative: 0 %
Eosinophils Absolute: 0.4 10*3/uL (ref 0.0–0.7)
Eosinophils Relative: 3 %
HCT: 34.6 % — ABNORMAL LOW (ref 36.0–46.0)
Hemoglobin: 11.7 g/dL — ABNORMAL LOW (ref 12.0–15.0)
Lymphocytes Relative: 7 %
Lymphs Abs: 1.1 10*3/uL (ref 0.7–4.0)
MCH: 31.5 pg (ref 26.0–34.0)
MCHC: 33.8 g/dL (ref 30.0–36.0)
MCV: 93.3 fL (ref 78.0–100.0)
Monocytes Absolute: 2.1 10*3/uL — ABNORMAL HIGH (ref 0.1–1.0)
Monocytes Relative: 14 %
Neutro Abs: 11.4 10*3/uL — ABNORMAL HIGH (ref 1.7–7.7)
Neutrophils Relative %: 76 %
Platelets: 449 10*3/uL — ABNORMAL HIGH (ref 150–400)
RBC: 3.71 MIL/uL — ABNORMAL LOW (ref 3.87–5.11)
RDW: 14.4 % (ref 11.5–15.5)
WBC: 15 10*3/uL — ABNORMAL HIGH (ref 4.0–10.5)

## 2015-11-01 LAB — GLUCOSE, CAPILLARY: Glucose-Capillary: 94 mg/dL (ref 65–99)

## 2015-11-01 NOTE — Clinical Social Work Note (Signed)
Clinical Social Work Assessment  Patient Details  Name: Crystal Park MRN: [id number] Date of Birth: May 03, 1960  Date of referral:  11/01/15               Reason for consult:  Facility Placement, Discharge Planning                Permission sought to share information with:  Chartered certified accountant granted to share information::  Yes, Verbal Permission Granted  Name::        Agency::     Relationship::     Contact Information:     Housing/Transportation Living arrangements for the past 2 months:  Single Family Home Source of Information:  Patient, Spouse Patient Interpreter Needed:  None Criminal Activity/Legal Involvement Pertinent to Current Situation/Hospitalization:  No - Comment as needed Significant Relationships:  Spouse Lives with:  Spouse Do you feel safe going back to the place where you live?  No Need for family participation in patient care:  Yes (Comment)  Care giving concerns:  Pt's care cannot be managed at home following hospital d/c.   Social Worker assessment / plan:  Pt hospitalized on 10/29/15 with UTI. CSW consulted to assist with d/c planning. CSW met with pt / spouse to review PT recommendations. Pt / spouse are in agreement with recommendations for ST Rehab. SNF search has been initiated and bed offers are pending. Pt has [employer] which requires prior authorization for SNF placement. CSW will assist with authorization process as needed. CSW will continue to follow to assist with d/c planning needs.  Employment status:    Insurance information:  Managed Care PT Recommendations:  [place] / Referral to community resources:  [place]  Patient/Family's Response to care:  Pt reports that she has never been to rehab but will give it a try.  Patient/Family's Understanding of and Emotional Response to Diagnosis, Current Treatment, and Prognosis:  Pt / spouse are aware of pt's medical status.  They have accepted recommendations for ST Rehab.   Emotional Assessment Appearance:  Appears stated age Attitude/Demeanor/Rapport:   (cooperative) Affect (typically observed):  Calm, Pleasant Orientation:  Oriented to Self, Oriented to Place, Oriented to Situation, Oriented to  Time Alcohol / Substance use:  Not Applicable Psych involvement (Current and /or in the community):  No (Comment)  Discharge Needs  Concerns to be addressed:  Discharge Planning Concerns Readmission within the last 30 days:  No Current discharge risk:  None Barriers to Discharge:  No Barriers Identified   Luretha Rued, [place] 11/01/2015, 3:25 PM

## 2015-11-01 NOTE — NC FL2 (Deleted)
[place] MEDICAID FL2 LEVEL OF CARE SCREENING TOOL     IDENTIFICATION  Patient Name: Crystal Park Birthdate: [birth year] Sex: female Admission Date (Current Location): 10/29/2015  [facility] [place]   Facility and Address:  [facility],  [address], [place] [id number]      Provider Number: [id number]  Attending Physician Name and Address:  Barnetta ChapelSylvester I Ogbata, MD  Relative Name and Phone Number:       Current Level of Care: Hospital Recommended Level of Care: Skilled Nursing Facility Prior Approval Number:    Date Approved/Denied:   PASRR Number: [id number] [id number]  Discharge Plan: SNF    Current Diagnoses: Patient Active Problem List   Diagnosis Date Noted  . Dehydration   . UTI (lower urinary tract infection) 10/29/2015  . Leukocytosis 10/29/2015  . Nausea without vomiting 10/29/2015  . MS (multiple sclerosis) (HCC) 10/26/2014    Orientation RESPIRATION BLADDER Height & Weight     Self, Time, Situation, Place  Normal Incontinent Weight: 131 lb 1.6 oz (59.5 kg) Height:  5\' 5"  (165.1 cm)  BEHAVIORAL SYMPTOMS/MOOD NEUROLOGICAL BOWEL NUTRITION STATUS  Other (Comment) (no behaviors)   Incontinent Diet  AMBULATORY STATUS COMMUNICATION OF NEEDS Skin   Extensive Assist Verbally Normal                       Personal Care Assistance Level of Assistance  Bathing, Feeding, Dressing Bathing Assistance: Maximum assistance Feeding assistance: Independent Dressing Assistance: Maximum assistance     Functional Limitations Info  Sight, Hearing, Speech Sight Info: Impaired Hearing Info: Adequate Speech Info: Adequate    SPECIAL CARE FACTORS FREQUENCY  PT (By licensed PT), OT (By licensed OT)     PT Frequency: 5 x wk OT Frequency: 5 x wk            Contractures Contractures Info: Not present    Additional Factors Info  Code Status Code Status Info: Full Code             Current Medications (11/01/2015):  This  is the current hospital active medication list Current Facility-Administered Medications  Medication Dose Route Frequency Provider Last Rate Last Dose  . 0.9 %  sodium chloride infusion   Intravenous Continuous Alison MurrayAlma M Devine, MD 75 mL/hr at 11/01/15 0406    . acetaminophen (TYLENOL) tablet 650 mg  650 mg Oral Q6H PRN Alison MurrayAlma M Devine, MD   650 mg at 10/31/15 1638   Or  . acetaminophen (TYLENOL) suppository 650 mg  650 mg Rectal Q6H PRN Alison MurrayAlma M Devine, MD      . amantadine (SYMMETREL) capsule 100 mg  100 mg Oral BID Alison MurrayAlma M Devine, MD   100 mg at 11/01/15 0945  . cefTRIAXone (ROCEPHIN) 1 g in dextrose 5 % 50 mL IVPB  1 g Intravenous Q24H Alison MurrayAlma M Devine, MD   1 g at 10/31/15 1716  . cholecalciferol (VITAMIN D) tablet 1,000 Units  1,000 Units Oral Daily Alison MurrayAlma M Devine, MD   1,000 Units at 11/01/15 0944  . dalfampridine TB12 10 mg  10 mg Oral BID Alison MurrayAlma M Devine, MD      . Dimethyl Fumarate CPDR 240 mg  1 capsule Oral BID Alison MurrayAlma M Devine, MD   240 mg at 11/01/15 1014  . fesoterodine (TOVIAZ) tablet 4 mg  4 mg Oral Daily Alison MurrayAlma M Devine, MD   4 mg at 11/01/15 0944  . hydrALAZINE (APRESOLINE) injection 10 mg  10 mg  Intravenous Q6H PRN Alison Murray, MD      . multivitamin with minerals tablet 1 tablet  1 tablet Oral Daily Alison Murray, MD   1 tablet at 11/01/15 0944  . ondansetron (ZOFRAN) tablet 4 mg  4 mg Oral Q6H PRN Alison Murray, MD       Or  . ondansetron [facility]) injection 4 mg  4 mg Intravenous Q6H PRN Alison Murray, MD       Facility-Administered Medications Ordered in Other Encounters  Medication Dose Route Frequency Provider Last Rate Last Dose  . gadopentetate dimeglumine (MAGNEVIST) injection 11 mL  11 mL Intravenous Once PRN Asa Lente, MD      . gadopentetate dimeglumine (MAGNEVIST) injection 12 mL  12 mL Intravenous Once PRN Asa Lente, MD         Discharge Medications: Please see discharge summary for a list of discharge medications.  Relevant Imaging Results:  Relevant Lab  Results:   Additional Information SS # [id number]  Piercen Covino, Dickey Gave, LCSW

## 2015-11-01 NOTE — Telephone Encounter (Signed)
Pt's husband called sts [facility] was [facility]. Sts she has a UTI. He is wanting to tell the provider about the events, what has been done. If the provider would have any thoughts of treatment pertaining to weakness and loss of bladder control. PT is getting referral for her to go to skilled nursing for strength rehab. He is also wanting to know if she should be seen before Oct 6.

## 2015-11-01 NOTE — Progress Notes (Signed)
PROGRESS NOTE    Crystal Park  NFA:[id number] DOB: [birth year] DOA: 10/29/2015 PCP:  Duane Lope, MD  Outpatient Specialists:   Brief Narrative: 55 y.o. female with medical history significant for MS, gait imbalance who presented from home to [facility] ED with worsening weakness, nausea and vomiting over past few days prior to this admission. She felt weak and fatigued. Patient reports no associated abdominal pain, no fevers or chills. No diarrhea or constipation. No reports of chest pain, shortness of breath or palpitations.   ED Course: In ED, pt was hemodynamically stable. She was very nauseous  nd did not tolerate much PO intake. Her WBC count was 20.4, sodium 131, glucose 102. UA was significant for many bacteria and too numerous to count WBC. She was admitted for further management of UTI. Rocephin started in ED.  Assessment & Plan:   Principal Problem:   UTI (lower urinary tract infection) Active Problems:   MS (multiple sclerosis) (HCC)   Leukocytosis   Nausea without vomiting   Dehydration  Principal Problem:   UTI (lower urinary tract infection) / Leukocytosis - Urine culture is growing Klebsiella Pneumonia. Follow sensitivity. Continue IV antibiotics.  Active Problems:   Multiple sclerosis (HCC) - Stable  - No new lesions on MRI    Nausea without vomiting / Weakness  - Resolved. - Continue supportive care.  Volume depletion - Continue IVF.  Weakness - likely multifactorial. Improving (see above). PT/OT assessing patient for short term rehab  DVT prophylaxis: SCD's bilaterally  Code Status: full code  Family Communication: husband at the bedside  Disposition Plan: admit for treatment of UTI Consults called: none  Admission status: inpatient    Procedures:   Noone  Antimicrobials:   Rocephin    Subjective: Nil new complaints. Feels better today.  Objective: Vitals:   10/31/15 1430 10/31/15 2117 11/01/15 0400 11/01/15 0641  BP: 120/74 136/78  137/66   Pulse: (!) 115 100 90   Resp: 14 12 14    Temp: 98 F (36.7 C) 98.4 F (36.9 C) 97.9 F (36.6 C)   TempSrc: Oral Oral Oral   SpO2: 100% 100% 100%   Weight:    59.5 kg (131 lb 1.6 oz)  Height:        Intake/Output Summary (Last 24 hours) at 11/01/15 0956 Last data filed at 11/01/15 0550  Gross per 24 hour  Intake             1380 ml  Output                0 ml  Net             1380 ml   Filed Weights   10/29/15 2023 10/30/15 0530 11/01/15 0641  Weight: 54.4 kg (120 lb) 57.7 kg (127 lb 3.2 oz) 59.5 kg (131 lb 1.6 oz)    Examination:  General exam: Appears calm and comfortable. Dry buccal mucosa. Respiratory system: Clear to auscultation.   Cardiovascular system: S1 & S2. Gastrointestinal system: Abdomen is nondistended, soft and nontender.   Central nervous system: Alert and oriented. Weakness both upper limbs. No movement of lower extremities.  Data Reviewed: I have personally reviewed following labs and imaging studies  CBC:  Recent Labs Lab 10/29/15 1126 10/29/15 2053 10/31/15 0415  WBC 20.4* 21.6* 12.6*  NEUTROABS 16.9*  --  8.5*  HGB 15.0 13.9 11.7*  HCT 43.8 40.7 34.5*  MCV 91.3 90.4 93.8  PLT 614* 582* 511*   Basic Metabolic Panel:  Recent Labs Lab 10/29/15 1126 10/29/15 2053 10/31/15 0415  NA 131* 138 138  K 4.8 4.2 3.9  CL 102 106 107  CO2 18* 23 27  GLUCOSE 102* 109* 107*  BUN 42* 25* 21*  CREATININE 0.83 0.70 0.79  CALCIUM 9.0 8.8* 8.3*  MG  --  2.0 1.8  PHOS  --  2.4* 2.7   GFR: Estimated Creatinine Clearance: 73.2 mL/min (by C-G formula based on SCr of 0.8 mg/dL). Liver Function Tests:  Recent Labs Lab 10/29/15 1126 10/31/15 0415  AST 13*  --   ALT 13*  --   ALKPHOS 90  --   BILITOT 0.7  --   PROT 7.3  --   ALBUMIN 3.8 2.9*    Recent Labs Lab 10/29/15 1126  LIPASE 32   No results for input(s): AMMONIA in the last 168 hours. Coagulation Profile: No results for input(s): INR, PROTIME in the last 168  hours. Cardiac Enzymes: No results for input(s): CKTOTAL, CKMB, CKMBINDEX, TROPONINI in the last 168 hours. BNP (last 3 results) No results for input(s): PROBNP in the last 8760 hours. HbA1C: No results for input(s): HGBA1C in the last 72 hours. CBG:  Recent Labs Lab 11/01/15 0733  GLUCAP 94   Lipid Profile: No results for input(s): CHOL, HDL, LDLCALC, TRIG, CHOLHDL, LDLDIRECT in the last 72 hours. Thyroid Function Tests: No results for input(s): TSH, T4TOTAL, FREET4, T3FREE, THYROIDAB in the last 72 hours. Anemia Panel: No results for input(s): VITAMINB12, FOLATE, FERRITIN, TIBC, IRON, RETICCTPCT in the last 72 hours. Urine analysis:    Component Value Date/Time   COLORURINE RED (A) 10/29/2015 1555   APPEARANCEUR CLOUDY (A) 10/29/2015 1555   LABSPEC 1.020 10/29/2015 1555   PHURINE 5.5 10/29/2015 1555   GLUCOSEU NEGATIVE 10/29/2015 1555   HGBUR LARGE (A) 10/29/2015 1555   BILIRUBINUR MODERATE (A) 10/29/2015 1555   KETONESUR >80 (A) 10/29/2015 1555   PROTEINUR 100 (A) 10/29/2015 1555   NITRITE POSITIVE (A) 10/29/2015 1555   LEUKOCYTESUR MODERATE (A) 10/29/2015 1555   Sepsis Labs: @LABRCNTIP (procalcitonin:4,lacticidven:4)  ) Recent Results (from the past 240 hour(s))  Urine culture     Status: Abnormal (Preliminary result)   Collection Time: 10/29/15  4:25 PM  Result Value Ref Range Status   Specimen Description URINE, RANDOM  Final   Special Requests NONE  Final   Culture 60,000 COLONIES/mL KLEBSIELLA PNEUMONIAE (A)  Final   Report Status PENDING  Incomplete  Culture, blood (Routine X 2) w Reflex to ID Panel     Status: None (Preliminary result)   Collection Time: 10/29/15  5:23 PM  Result Value Ref Range Status   Specimen Description BLOOD RIGHT AC  Final   Special Requests BOTTLES DRAWN AEROBIC AND ANAEROBIC 5CC  Final   Culture   Final    NO GROWTH < 24 HOURS Performed at [facility]    Report Status PENDING  Incomplete  Culture, blood (Routine X 2)  w Reflex to ID Panel     Status: None (Preliminary result)   Collection Time: 10/29/15  5:23 PM  Result Value Ref Range Status   Specimen Description BLOOD RIGHT [facility]  Final   Special Requests BOTTLES DRAWN AEROBIC AND ANAEROBIC 5CC  Final   Culture   Final    NO GROWTH < 24 HOURS Performed at [facility]    Report Status PENDING  Incomplete         Radiology Studies: No results found.      Scheduled  Meds: . amantadine  100 mg Oral BID  . cefTRIAXone (ROCEPHIN)  IV  1 g Intravenous Q24H  . cholecalciferol  1,000 Units Oral Daily  . dalfampridine  10 mg Oral BID  . Dimethyl Fumarate  1 capsule Oral BID  . fesoterodine  4 mg Oral Daily  . multivitamin with minerals  1 tablet Oral Daily   Continuous Infusions: . sodium chloride 75 mL/hr at 11/01/15 0406     LOS: 3 days    Time spent: Greater than 30 Minutes.    Berton MountSylvester Barret Esquivel, MD  [facility] Pager #: [phone] 7PM-7AM contact night coverage as above

## 2015-11-01 NOTE — Telephone Encounter (Signed)
LMOM (identified vm) that per RAS, let the hospital treat her uti.  It is difficult to eval MS sx. when there is an active infection, as many sx. may be exacerbated due to infection, and not progression of MS.  Once she is d/c from the hosptal, she should make an appt. for a 40 min. hospital f/u visit with Dr. Lenice Pressman

## 2015-11-01 NOTE — Progress Notes (Signed)
Occupational Therapy Treatment [name] MRN: [id number] DOB: December 30, 1960 Today's Date: 11/01/2015    History of present illness 55 y.o. female with medical history significant for MS, gait imbalance who [facility] with worsening weakness, nausea and vomiting over past few days prior to this admission. She felt weak and fatigued.  Found to have UTI   OT comments  Patient progressing slowly and needs extensive assistance with mobility and ADLs. Would benefit from SNF at discharge as family worried about ability to care for patient safely at home.   Follow Up Recommendations  SNF    Equipment Recommendations  3 in 1 bedside comode    Recommendations for Other Services      Precautions / Restrictions Precautions Precautions: Fall Precaution Comments: recent fall at home injuring head/neck per patient Restrictions Weight Bearing Restrictions: No       Mobility Bed Mobility Overal bed mobility: Needs Assistance Bed Mobility: Sidelying to Sit   Sidelying to sit: Mod assist;+2 for physical assistance;+2 for safety/equipment       General bed mobility comments: assist with LEs and trunk, cues to self assist  Transfers Overall transfer level: Needs assistance Equipment used: Rolling walker (2 wheeled) Transfers: Sit to/from Stand Sit to Stand: Min assist;+2 safety/equipment;From elevated surface         General transfer comment: cues for hand placement during transfers, incr time assist for balance during transition to/from walker    Balance                                   ADL Overall ADL's : Needs assistance/impaired                 Upper Body Dressing : Maximal assistance;Sitting Upper Body Dressing Details (indicate cue type and reason): don/doff gown as robe Lower Body Dressing: Total assistance;Bed level Lower Body Dressing Details (indicate cue type and reason): don socks     Toileting-  Clothing Manipulation and Hygiene: Total assistance;Bed level       Functional mobility during ADLs: Minimal assistance;Moderate assistance;+2 for physical assistance;+2 for safety/equipment;Rolling walker        Vision                     Perception     Praxis      Cognition   Behavior During Therapy: [facility] for tasks assessed/performed   Area of Impairment: Following commands;Problem solving        Following Commands: Follows one step commands with increased time     Problem Solving: Slow processing;Decreased initiation;Requires verbal cues;Requires tactile cues      Extremity/Trunk Assessment               Exercises     Shoulder Instructions       General Comments      Pertinent Vitals/ Pain       Pain Assessment: Faces Faces Pain Scale: Hurts a little bit Pain Location: neck Pain Descriptors / Indicators: Grimacing Pain Intervention(s): Limited activity within patient's tolerance;Monitored during session  Home Living                                          Prior Functioning/Environment  Frequency Min 2X/week     Progress Toward Goals  OT Goals(current goals can now be found in the care plan section)  Progress towards OT goals: Not progressing toward goals - comment (very slow progress towards OT goals)  Acute Rehab OT Goals Patient Stated Goal: to go home  Plan Discharge plan needs to be updated    Co-evaluation      Reason for Co-Treatment: For patient/therapist safety;Necessary to address cognition/behavior during functional activity PT goals addressed during session: Mobility/safety with mobility;Balance;Proper use of DME OT goals addressed during session: ADL's and self-care      End of Session Equipment Utilized During Treatment: Gait belt;Rolling walker   Activity Tolerance Patient tolerated treatment well   Patient Left in chair;with call bell/phone within reach;with family/visitor  present   Nurse Communication Mobility status        Time: 1259-1340 OT Time Calculation (min): 41 min  Charges: OT General Charges $OT Visit: 1 Procedure OT Treatments $Therapeutic Activity: 8-22 mins  Caitlyn Buchanan A 11/01/2015, 2:43 PM

## 2015-11-01 NOTE — Clinical Social Work Placement (Signed)
   CLINICAL SOCIAL WORK PLACEMENT  NOTE  Date:  11/01/2015  Patient Details  Name: Crystal Park MRN: [id number] Date of Birth: 01-Apr-1960  Clinical Social Work is seeking post-discharge placement for this patient at the Skilled  Nursing Facility level of care (*CSW will initial, date and re-position this form in  chart as items are completed):  Yes   Patient/family provided with [facility] Clinical Social Work Department's list of facilities offering this level of care within the geographic area requested by the patient (or if unable, by the patient's family).  Yes   Patient/family informed of their freedom to choose among providers that offer the needed level of care, that participate in Medicare, Medicaid or managed care program needed by the patient, have an available bed and are willing to accept the patient.  Yes   Patient/family informed of [place]'s ownership interest in [facility] and [facility], as well as of the fact that they are under no obligation to receive care at these facilities.  PASRR submitted to EDS on 11/01/15     PASRR number received on 11/01/15     Existing PASRR number confirmed on       FL2 transmitted to all facilities in geographic area requested by pt/family on 11/01/15     FL2 transmitted to all facilities within larger geographic area on       Patient informed that his/her managed care company has contracts with or will negotiate with certain facilities, including the following:            Patient/family informed of bed offers received.  Patient chooses bed at       Physician recommends and patient chooses bed at      Patient to be transferred to   on  .  Patient to be transferred to facility by       Patient family notified on   of transfer.  Name of family member notified:        PHYSICIAN       Additional Comment:    _______________________________________________ Royetta Asal, [place]    [phone] 11/01/2015, 3:34 PM

## 2015-11-01 NOTE — Progress Notes (Signed)
   11/01/15 1400  PT Visit Information  Last PT Received On 11/01/15  Assistance Needed +2 (safety)  PT/OT/SLP Co-Evaluation/Treatment Yes  Reason for Co-Treatment For patient/therapist safety;Necessary to address cognition/behavior during functional activity  PT goals addressed during session Mobility/safety with mobility;Balance;Proper use of DME  Subjective Data  Subjective I can't  Patient Stated Goal to go home  Precautions  Precautions Fall  Precaution Comments recent fall at home injuring head/neck per patient  Restrictions  Weight Bearing Restrictions No  Pain Assessment  Pain Assessment Faces  Faces Pain Scale 2  Pain Location neck  Pain Descriptors / Indicators Grimacing  Pain Intervention(s) Limited activity within patient's tolerance;Monitored during session  Cognition  Arousal/Alertness Awake/alert  Behavior During Therapy [facility] for tasks assessed/performed  Area of Impairment Following commands;Problem solving  Following Commands Follows one step commands with increased time  Problem Solving Slow processing;Decreased initiation;Requires verbal cues;Requires tactile cues  Bed Mobility  Overal bed mobility Needs Assistance  Bed Mobility Sidelying to Sit  Sidelying to sit Mod assist;+2 for physical assistance;+2 for safety/equipment  General bed mobility comments assist with LEs and trunk, cues to self assist  Transfers  Overall transfer level Needs assistance  Equipment used Rolling walker (2 wheeled)  Transfers Sit to/from Stand  Sit to Stand Min assist;+2 safety/equipment;From elevated surface  General transfer comment cues for hand placement during transfers, incr time assist for balance during transition to/from walker  Ambulation/Gait  Ambulation/Gait assistance Min assist;+2 safety/equipment  Ambulation Distance (Feet) 25 Feet  Assistive device Rolling walker (2 wheeled)  Gait Pattern/deviations Step-to pattern;Decreased step length - right;Decreased step  length - left;Trunk flexed;Narrow base of support  General Gait Details cues for sequence, Rw use and safety  Stairs Yes  Stairs assistance Mod assist (+2 safety)  Stair Management Two rails;Step to pattern;Forwards  Number of Stairs 3  General stair comments cues for sequence, assist to advance LLE onto step and wt shifting onto step  PT - End of Session  Equipment Utilized During Treatment Gait belt  Activity Tolerance Patient tolerated treatment well;Patient limited by fatigue  Patient left in chair;with call bell/phone within reach;with chair alarm set;with family/visitor present  Nurse Communication Mobility status  PT - Assessment/Plan  PT Plan Current plan remains appropriate  PT Frequency (ACUTE ONLY) Min 3X/week  Follow Up Recommendations Supervision/Assistance - 24 hour;SNF  PT equipment Rolling walker with 5" wheels  PT Goal Progression  Progress towards PT goals Progressing toward goals  Acute Rehab PT Goals  PT Goal Formulation With patient  Time For Goal Achievement 11/07/15  Potential to Achieve Goals Good  PT Time Calculation  PT Start Time (ACUTE ONLY) 1259  PT Stop Time (ACUTE ONLY) 1340  PT Time Calculation (min) (ACUTE ONLY) 41 min  PT General Charges  $$ ACUTE PT VISIT 1 Procedure  PT Treatments  $Gait Training 23-37 mins

## 2015-11-02 DIAGNOSIS — N390 Urinary tract infection, site not specified: Secondary | ICD-10-CM

## 2015-11-02 DIAGNOSIS — D72829 Elevated white blood cell count, unspecified: Secondary | ICD-10-CM

## 2015-11-02 DIAGNOSIS — G35 Multiple sclerosis: Secondary | ICD-10-CM

## 2015-11-02 LAB — GLUCOSE, CAPILLARY: Glucose-Capillary: 137 mg/dL — ABNORMAL HIGH (ref 65–99)

## 2015-11-02 NOTE — Progress Notes (Signed)
PROGRESS NOTE    Crystal Park  RUE:[id number] DOB: 1960/11/17 DOA: 10/29/2015 PCP:  Duane Lope, MD    Brief Narrative: 55 y.o.femalewith medical history significant for MS, gait imbalance who presented from home to [facility] ED with worsening weakness, nausea and vomiting over past few days prior to this admission. She felt weak and fatigued. Patient reports no associated abdominal pain, no fevers or chills. No diarrhea or constipation. No reports of chest pain, shortness of breath or palpitations.   Assessment & Plan:   Principal Problem:   UTI (lower urinary tract infection) Active Problems:   MS (multiple sclerosis) (HCC)   Leukocytosis   Nausea without vomiting   Dehydration  1. Urinary tract infection. Urine culture positive for klebsiella pneumoniae, will continue antibiotic therapy with ceftriaxone and follow with sensitivities.  Blood cultures with no growth, no history of recurrent urinary tract infections. Continue fesoteradine.   2. Deconditioning. Will continue physical therapy, plan for snf [facility].  3. Multiple sclerosis. DVT px, continue amantadine and tecfidera    DVT prophylaxis: scd Code Status:  full Family Communication: I spoke with the patient's family at the bedside and all questions were addressed.  Disposition Plan: SNF  Consultants:     Procedures:   Antimicrobials:   Ceftriaxone #3   Subjective: Patient feels weak and deconditioned, positive headache, moderate in intensity, with no improving factors, no radiation and no associated symptoms. No nausea or vomiting, tolerating po well. No constipation.   Objective: Vitals:   11/01/15 0641 11/01/15 1416 11/01/15 2110 11/02/15 0452  BP:  123/80 123/76 129/77  Pulse:  (!) 105 (!) 101 (!) 107  Resp:  16 16 16   Temp:  97.6 F (36.4 C) 98.5 F (36.9 C) 98.2 F (36.8 C)  TempSrc:  Oral Oral Oral  SpO2:  100% 100% 100%  Weight: 59.5 kg (131 lb 1.6 oz)     Height:        Intake/Output  Summary (Last 24 hours) at 11/02/15 1019 Last data filed at 11/02/15 0937  Gross per 24 hour  Intake              720 ml  Output                0 ml  Net              720 ml   Filed Weights   10/29/15 2023 10/30/15 0530 11/01/15 0641  Weight: 54.4 kg (120 lb) 57.7 kg (127 lb 3.2 oz) 59.5 kg (131 lb 1.6 oz)    Examination:  General exam: Deconditioned E ENT: mild conjunctival pallor, oral mucosa moist.  Respiratory system: Clear to auscultation. Respiratory effort normal. Mild decreased breath sounds at bases due to poor inspiratory effort. Cardiovascular system: S1 & S2 heard, RRR. No JVD, murmurs, rubs, gallops or clicks. No pedal edema. Gastrointestinal system: Abdomen is nondistended, soft and nontender. No organomegaly or masses felt. Normal bowel sounds heard. Central nervous system: Alert and oriented. Extremities: generalized weakness Skin: No rashes, lesions or ulcers Psychiatry: Judgement and insight appear normal. Mood & affect appropriate.     Data Reviewed: I have personally reviewed following labs and imaging studies  CBC:  Recent Labs Lab 10/29/15 1126 10/29/15 2053 10/31/15 0415 11/01/15 1016  WBC 20.4* 21.6* 12.6* 15.0*  NEUTROABS 16.9*  --  8.5* 11.4*  HGB 15.0 13.9 11.7* 11.7*  HCT 43.8 40.7 34.5* 34.6*  MCV 91.3 90.4 93.8 93.3  PLT 614* 582* 511* 449*  Basic Metabolic Panel:  Recent Labs Lab 10/29/15 1126 10/29/15 2053 10/31/15 0415  NA 131* 138 138  K 4.8 4.2 3.9  CL 102 106 107  CO2 18* 23 27  GLUCOSE 102* 109* 107*  BUN 42* 25* 21*  CREATININE 0.83 0.70 0.79  CALCIUM 9.0 8.8* 8.3*  MG  --  2.0 1.8  PHOS  --  2.4* 2.7   GFR: Estimated Creatinine Clearance: 73.2 mL/min (by C-G formula based on SCr of 0.8 mg/dL). Liver Function Tests:  Recent Labs Lab 10/29/15 1126 10/31/15 0415  AST 13*  --   ALT 13*  --   ALKPHOS 90  --   BILITOT 0.7  --   PROT 7.3  --   ALBUMIN 3.8 2.9*    Recent Labs Lab 10/29/15 1126  LIPASE 32     No results for input(s): AMMONIA in the last 168 hours. Coagulation Profile: No results for input(s): INR, PROTIME in the last 168 hours. Cardiac Enzymes: No results for input(s): CKTOTAL, CKMB, CKMBINDEX, TROPONINI in the last 168 hours. BNP (last 3 results) No results for input(s): PROBNP in the last 8760 hours. HbA1C: No results for input(s): HGBA1C in the last 72 hours. CBG:  Recent Labs Lab 11/01/15 0733 11/02/15 0719  GLUCAP 94 137*   Lipid Profile: No results for input(s): CHOL, HDL, LDLCALC, TRIG, CHOLHDL, LDLDIRECT in the last 72 hours. Thyroid Function Tests: No results for input(s): TSH, T4TOTAL, FREET4, T3FREE, THYROIDAB in the last 72 hours. Anemia Panel: No results for input(s): VITAMINB12, FOLATE, FERRITIN, TIBC, IRON, RETICCTPCT in the last 72 hours. Sepsis Labs:  Recent Labs Lab 10/29/15 1728  LATICACIDVEN 0.93    Recent Results (from the past 240 hour(s))  Urine culture     Status: Abnormal (Preliminary result)   Collection Time: 10/29/15  4:25 PM  Result Value Ref Range Status   Specimen Description URINE, RANDOM  Final   Special Requests NONE  Final   Culture 60,000 COLONIES/mL KLEBSIELLA PNEUMONIAE (A)  Final   Report Status PENDING  Incomplete  Culture, blood (Routine X 2) w Reflex to ID Panel     Status: None (Preliminary result)   Collection Time: 10/29/15  5:23 PM  Result Value Ref Range Status   Specimen Description BLOOD RIGHT AC  Final   Special Requests BOTTLES DRAWN AEROBIC AND ANAEROBIC 5CC  Final   Culture   Final    NO GROWTH 3 DAYS [facility]    Report Status PENDING  Incomplete  Culture, blood (Routine X 2) w Reflex to ID Panel     Status: None (Preliminary result)   Collection Time: 10/29/15  5:23 PM  Result Value Ref Range Status   Specimen Description [facility]LOOD RIGHT [facility]  Final   Special Requests BOTTLES DRAWN AEROBIC AND ANAEROBIC 5CC  Final   Culture   Final    NO GROWTH 3 DAYS [facility]    Report Status PENDING  Incomplete         Radiology Studies: No results found.      Scheduled Meds: . amantadine  100 mg Oral BID  . cefTRIAXone (ROCEPHIN)  IV  1 g Intravenous Q24H  . cholecalciferol  1,000 Units Oral Daily  . Dimethyl Fumarate  1 capsule Oral BID  . fesoterodine  4 mg Oral Daily  . multivitamin with minerals  1 tablet Oral Daily   Continuous Infusions: . sodium chloride 75 mL/hr (11/01/15 1803)  LOS: 4 days        Coralie Keens, MD [facility] Pager [phone]  If 7PM-7AM, please contact night-coverage [email or web address] Password [email or web address] 11/02/2015, 10:19 AM

## 2015-11-02 NOTE — Care Management Note (Addendum)
Case Management Note  Patient Details  Name: Crystal Park MRN: [id number] Date of Birth: 05-24-1960  Subjective/Objective:   55 y.o. F admitted 10/29/2015 with weakness, UTI from Home.  Pt has hx MS. Currently receiving IV Rocephin Q24H for UTI. Disposition in holding pattern until Sensitivities return from U/A which is positive for Kliebsiella Pneumonia.                  Action/Plan: [facility] at Discharge.    Expected Discharge Date:                  Expected Discharge Plan:  Skilled Nursing Facility ([facility])  In-House Referral:  NA, Clinical Social Work  Discharge planning Services  CM Consult  Post Acute Care Choice:  Home Health, Durable Medical Equipment Choice offered to:  Patient  DME Arranged:  3-N-1, Walker rolling DME Agency:  NA  HH Arranged:  PT, OT HH Agency:  [facility]  Status of Service:  Completed, signed off  If discussed at [employer], dates discussed:    Additional Comments:  Yvone Neu, RN 11/02/2015, 3:19 PM

## 2015-11-02 NOTE — NC FL2 (Signed)
[place] MEDICAID FL2 LEVEL OF CARE SCREENING TOOL     IDENTIFICATION  Patient Name: Glenna Fellowsatricia M Solar Birthdate: 04-11-1960 Sex: female Admission Date (Current Location): 10/29/2015  [facility] [place]   Facility and Address:  [facility],  [address], [place] [id number]      Provider Number: [id number]  Attending Physician Name and Address:  Coralie KeensMauricio Daniel Dempsey Ahonen, *  Relative Name and Phone Number:       Current Level of Care: Hospital Recommended Level of Care: Skilled Nursing Facility Prior Approval Number:    Date Approved/Denied:   PASRR Number: [id number]  Discharge Plan: SNF    Current Diagnoses: Patient Active Problem List   Diagnosis Date Noted  . Dehydration   . UTI (lower urinary tract infection) 10/29/2015  . Leukocytosis 10/29/2015  . Nausea without vomiting 10/29/2015  . MS (multiple sclerosis) (HCC) 10/26/2014    Orientation RESPIRATION BLADDER Height & Weight     Self, Time, Situation, Place  Normal Incontinent Weight: 131 lb 1.6 oz (59.5 kg) Height:  5\' 5"  (165.1 cm)  BEHAVIORAL SYMPTOMS/MOOD NEUROLOGICAL BOWEL NUTRITION STATUS  Other (Comment) (no behaviors)   Incontinent Diet  AMBULATORY STATUS COMMUNICATION OF NEEDS Skin   Extensive Assist Verbally Normal                       Personal Care Assistance Level of Assistance  Bathing, Feeding, Dressing Bathing Assistance: Maximum assistance Feeding assistance: Independent Dressing Assistance: Maximum assistance     Functional Limitations Info  Sight, Hearing, Speech Sight Info: Impaired Hearing Info: Adequate Speech Info: Adequate    SPECIAL CARE FACTORS FREQUENCY  PT (By licensed PT), OT (By licensed OT)     PT Frequency: 5 x wk OT Frequency: 5 x wk            Contractures Contractures Info: Not present    Additional Factors Info  Code Status Code Status Info: Full Code             Current Medications (11/02/2015):   This is the current hospital active medication list Current Facility-Administered Medications  Medication Dose Route Frequency Provider Last Rate Last Dose  . acetaminophen (TYLENOL) tablet 650 mg  650 mg Oral Q6H PRN Alison MurrayAlma M Devine, MD   650 mg at 10/31/15 1638   Or  . acetaminophen (TYLENOL) suppository 650 mg  650 mg Rectal Q6H PRN Alison MurrayAlma M Devine, MD      . amantadine (SYMMETREL) capsule 100 mg  100 mg Oral BID Alison MurrayAlma M Devine, MD   100 mg at 11/02/15 0858  . cefTRIAXone (ROCEPHIN) 1 g in dextrose 5 % 50 mL IVPB  1 g Intravenous Q24H Alison MurrayAlma M Devine, MD   1 g at 11/01/15 1804  . cholecalciferol (VITAMIN D) tablet 1,000 Units  1,000 Units Oral Daily Alison MurrayAlma M Devine, MD   1,000 Units at 11/02/15 [phone]  . Dimethyl Fumarate CPDR 240 mg  1 capsule Oral BID Alison MurrayAlma M Devine, MD   240 mg at 11/02/15 0931  . fesoterodine (TOVIAZ) tablet 4 mg  4 mg Oral Daily Alison MurrayAlma M Devine, MD   4 mg at 11/02/15 0858  . hydrALAZINE (APRESOLINE) injection 10 mg  10 mg Intravenous Q6H PRN Alison MurrayAlma M Devine, MD      . multivitamin with minerals tablet 1 tablet  1 tablet Oral Daily Alison MurrayAlma M Devine, MD   1 tablet at 11/02/15 [phone]  . ondansetron (ZOFRAN) tablet 4 mg  4 mg Oral Q6H PRN Alison Murray, MD       Or  . ondansetron [facility]) injection 4 mg  4 mg Intravenous Q6H PRN Alison Murray, MD       Facility-Administered Medications Ordered in Other Encounters  Medication Dose Route Frequency Provider Last Rate Last Dose  . gadopentetate dimeglumine (MAGNEVIST) injection 11 mL  11 mL Intravenous Once PRN Asa Lente, MD      . gadopentetate dimeglumine (MAGNEVIST) injection 12 mL  12 mL Intravenous Once PRN Asa Lente, MD         Discharge Medications: Please see discharge summary for a list of discharge medications.  Relevant Imaging Results:  Relevant Lab Results:   Additional Information SS # [id number]  Haidinger, Dickey Gave, LCSW

## 2015-11-03 ENCOUNTER — Telehealth: Payer: Self-pay | Admitting: Neurology

## 2015-11-03 ENCOUNTER — Inpatient Hospital Stay (HOSPITAL_COMMUNITY): Payer: Managed Care, Other (non HMO)

## 2015-11-03 DIAGNOSIS — R110 Nausea: Secondary | ICD-10-CM

## 2015-11-03 DIAGNOSIS — A419 Sepsis, unspecified organism: Principal | ICD-10-CM

## 2015-11-03 LAB — CBC WITH DIFFERENTIAL/PLATELET
Basophils Absolute: 0.1 10*3/uL (ref 0.0–0.1)
Basophils Relative: 1 %
Eosinophils Absolute: 0.5 10*3/uL (ref 0.0–0.7)
Eosinophils Relative: 3 %
HCT: 33.7 % — ABNORMAL LOW (ref 36.0–46.0)
Hemoglobin: 11.5 g/dL — ABNORMAL LOW (ref 12.0–15.0)
Lymphocytes Relative: 11 %
Lymphs Abs: 1.4 10*3/uL (ref 0.7–4.0)
MCH: 31.5 pg (ref 26.0–34.0)
MCHC: 34.1 g/dL (ref 30.0–36.0)
MCV: 92.3 fL (ref 78.0–100.0)
Monocytes Absolute: 1.6 10*3/uL — ABNORMAL HIGH (ref 0.1–1.0)
Monocytes Relative: 12 %
Neutro Abs: 9.9 10*3/uL — ABNORMAL HIGH (ref 1.7–7.7)
Neutrophils Relative %: 73 %
Platelets: 446 10*3/uL — ABNORMAL HIGH (ref 150–400)
RBC: 3.65 MIL/uL — ABNORMAL LOW (ref 3.87–5.11)
RDW: 14.2 % (ref 11.5–15.5)
WBC: 13.5 10*3/uL — ABNORMAL HIGH (ref 4.0–10.5)

## 2015-11-03 LAB — BASIC METABOLIC PANEL
Anion gap: 5 (ref 5–15)
BUN: 9 mg/dL (ref 6–20)
CO2: 30 mmol/L (ref 22–32)
Calcium: 8.6 mg/dL — ABNORMAL LOW (ref 8.9–10.3)
Chloride: 105 mmol/L (ref 101–111)
Creatinine, Ser: 0.49 mg/dL (ref 0.44–1.00)
GFR calc Af Amer: >60 mL/min (ref >60)
GFR calc non Af Amer: >60 mL/min (ref >60)
Glucose, Bld: 101 mg/dL — ABNORMAL HIGH (ref 65–99)
Potassium: 3.4 mmol/L — ABNORMAL LOW (ref 3.5–5.1)
Sodium: 140 mmol/L (ref 135–145)

## 2015-11-03 LAB — URINE CULTURE: Culture: 60000 — AB

## 2015-11-03 LAB — CULTURE, BLOOD (ROUTINE X 2)
Culture: NO GROWTH
Culture: NO GROWTH

## 2015-11-03 LAB — GLUCOSE, CAPILLARY: Glucose-Capillary: 94 mg/dL (ref 65–99)

## 2015-11-03 MED ORDER — LEVOFLOXACIN 500 MG PO TABS
500.0000 mg | ORAL_TABLET | Freq: Once | ORAL | Status: AC
Start: 2015-11-03 — End: 2015-11-03
  Administered 2015-11-03: 500 mg via ORAL
  Filled 2015-11-03: qty 1

## 2015-11-03 MED ORDER — CIPROFLOXACIN HCL 500 MG PO TABS: 250.0000 mg | ORAL_TABLET | Freq: Two times a day (BID) | ORAL | 0 refills | Status: DC

## 2015-11-03 MED ORDER — GADOBENATE DIMEGLUMINE 529 MG/ML IV SOLN
15.0000 mL | Freq: Once | INTRAVENOUS | Status: AC | PRN
  Administered 2015-11-03: 15 mL via INTRAVENOUS

## 2015-11-03 NOTE — Progress Notes (Signed)
Notified [facility] by phone re discharge cancelled for today. Also text paged Dr Ella Jubilee re lost iv access before IVABX infused. Macala Baldonado, [employer]

## 2015-11-03 NOTE — Telephone Encounter (Signed)
Pt's husband called stating pt should be d/c'd today and tx'd to rehab center. She is still very weak. Question: From her torso down she has little to no control of her bm's or urine. Is this something that is contributing to UTI or is it MS? Can the UTI affect this? Does he need to push the hospital to do and MRI on her again. Please call [phone]

## 2015-11-03 NOTE — Discharge Summary (Addendum)
Physician Discharge Summary  Crystal Park:[id number] DOB: [birth year] DOA: 10/29/2015  PCP:  Duane Lope, MD  Admit date: 10/29/2015 Discharge date: 11/04/2015  Admitted From: Home Disposition:  SNF  Recommendations for Outpatient Follow-up:  1. Follow up with PCP in 1-2 weeks 2. Please obtain BMP/CBC in one week 3. Please follow up on the following pending results:  Home Health: NA Equipment/Devices: None  Discharge Condition: Stable CODE STATUS: Full Diet recommendation: Regular  Brief/Interim Summary: This is a 55 year old female who presents to the hospital with the chief complaint of worsening weakness, associated with nausea and vomiting for last few days prior to admission. On initial physical examination, she was afebrile, blood pressure 143/99, heart rate 115, respiratory rate of 20, temperature is 97.4, her mucous membranes were moist, her lungs were clear to auscultation bilaterally, heart S1-S2 present rhythmic, her abdomen was soft and nontender: Lower extremities had no edema. She had generalized weakness. Her sodium was 131, potassium 4.8, creatinine 0.83, BUN 42, glucose 102, white count was 20.4, hemolytic 15.0, hematocrit 43.8, platelet count was 614. Her urinalysis had too numerous to count white cells, moderate leukocytes, positive nitrates. Her chest film was negative for infiltrates. MRI of the brain showed white matter changes consistent with multiple sclerosis with moderate severity, mild improvement since prior study. Cervical spine MRI showed improvement in cord lesions compatible with multiple sclerosis compare with one year ago.  Patient was admitted to the hospital with the working diagnosis of generalized weakness due to sepsis due to urinary tract infection.  1. Sepsis due to urinary tract infection. Patient was started on antibiotic therapy with IV ceftriaxone, intravenous IV fluids. She responded well to medical therapy with improvement of her white  count, down to 13.5 by the time of discharge. Her urinary culture was positive for Klebsiella pneumonia, which was sensitive to most of the antibiotics except ampicillin and nitrofurantoin. Patient will be discharge with ciprofloxacin po for 5 more days.  2. Multiple sclerosis. Imaging showed improvement of lesions. She was seen by physical therapy, with recommendations for skilled nursing facility for rehabilitation. At this point her worsening weakness is presumed to be related to her recovering sepsis and multiple sclerosis. I spoke with Dr Epimenio Foot from neurology, thoracic MRI was performed, results discussed with him, plan for outpatient follow up next week.   3. Nausea and vomiting. Gastrointestinal symptoms were attributed to urine infection and sepsis. Her symptoms improved by the of time of discharge she is tolerating by mouth diet adequately.    Discharge Diagnoses:  Principal Problem:   UTI (lower urinary tract infection) Active Problems:   MS (multiple sclerosis) (HCC)   Leukocytosis   Nausea without vomiting   Dehydration    Discharge Instructions  Discharge Instructions    Diet - low sodium heart healthy    Complete by:  As directed   Discharge instructions    Complete by:  As directed   Continue antibiotic therapy for 3 days more.   Increase activity slowly    Complete by:  As directed       Medication List    STOP taking these medications   LORazepam 0.5 MG tablet Commonly known as:  ATIVAN   meclizine 25 MG tablet Commonly known as:  ANTIVERT   methylPREDNISolone 4 MG tablet Commonly known as:  MEDROL   mirabegron ER 50 MG Tb24 tablet Commonly known as:  MYRBETRIQ     TAKE these medications   amantadine 100 MG capsule Commonly known as:  SYMMETREL Take 1 capsule (100 mg total) by mouth 2 (two) times daily.   cholecalciferol 1000 units tablet Commonly known as:  VITAMIN D Take 1,000 Units by mouth daily. otc vit. d 5,000iu daily/fim   ciprofloxacin  500 MG tablet Commonly known as:  CIPRO Take 0.5 tablets (250 mg total) by mouth 2 (two) times daily.   CRANBERRY PO Take 1 capsule by mouth daily.   dalfampridine 10 MG Tb12 Take 10 mg by mouth 2 (two) times daily.   dimenhyDRINATE 50 MG tablet Commonly known as:  DRAMAMINE Take 50 mg by mouth every 8 (eight) hours as needed for dizziness.   multivitamin tablet Take 1 tablet by mouth daily.   progesterone 200 MG capsule Commonly known as:  PROMETRIUM Take 200 mg by mouth daily. For 10 days   TECFIDERA 240 MG Cpdr Generic drug:  Dimethyl Fumarate TAKE 1 CAPSULE BY MOUTH TWICE DAILY   tolterodine 4 MG 24 hr capsule Commonly known as:  DETROL LA Take 4 mg by mouth daily. Reported on 04/26/2015       Contact information for follow-up providers    [facility] .   Specialty:  Home Health Services Why:  physical therapy and occupational therapy Contact information: [address] [place] [address] [phone]        Claudean Kinds, MD Follow up in 1 week(s).   Specialty:  Emergency Medicine Contact information: [address] [place] [address] [phone]            Contact information for after-discharge care    Destination    [facility] .   Specialty:  Skilled Nursing Facility Contact information: [address] [place] [address] [phone]                 No Known Allergies  Consultations:     Procedures/Studies: Mr Brain Wo Contrast  Result Date: 10/29/2015 CLINICAL DATA:  Multiple sclerosis exacerbation.  Vertigo, fall EXAM: MRI HEAD WITHOUT CONTRAST TECHNIQUE: Multiplanar, multiecho pulse sequences of the brain and surrounding structures were obtained without intravenous contrast. COMPARISON:  MRI head 05/18/2015 FINDINGS: Periventricular and deep white matter hyperintensities bilaterally compatible with multiple sclerosis. Mild improvement in white matter  lesions since the prior study. Most notably, the large lesion in the left temporal periventricular white matter is smaller. Other lesions are smaller as well. Hyperintensity in the left posterior lateral cerebellum is unchanged and may be due to demyelinating disease. Subtle lesion right pons probably due to multiple sclerosis. Lesions in the midbrain, pons and cervical medullary junction better seen on sagittal T2 images of the cervical spine and consistent with multiple sclerosis. No new lesions. No lesion show restricted diffusion. Intravenous contrast not administered. Mild T2 shine through right frontal parietal white matter. Negative for intracranial hemorrhage. Negative for mass or edema. No shift of the midline structures. Ventricle size normal.  Cerebral volume normal. IMPRESSION: White matter changes of multiple sclerosis of moderate severity. Mild improvement since the prior study. Several lesions appear smaller most notably a lesion in the left posterior temporal lobe. No new lesions. Electronically Signed   By: Marlan Palau M.D.   On: 10/29/2015 16:05   Mr Cervical Spine W Wo Contrast  Result Date: 10/29/2015 CLINICAL DATA:  Multiple sclerosis exacerbation.  Vertigo and fall. EXAM: MRI CERVICAL SPINE WITHOUT AND WITH CONTRAST TECHNIQUE: Multiplanar and multiecho pulse sequences of the cervical spine, to include the craniocervical junction and cervicothoracic junction, were  obtained according to standard protocol without and with intravenous contrast. CONTRAST:  11 mL MultiHance IV COMPARISON:  Cervical MRI 10/20/2014 FINDINGS: Normal cervical alignment. Negative for fracture or mass. Normal bone marrow. Normal spinal canal diameter. Hyperintense lesions in the midbrain and pons unchanged. Lesion at the cervical medullary junction improved. Anterior cord lesion at the C4 level improved. Ill-defined lesion at C7-T1 improved. No new cord lesions. Cord lesions do not show abnormal enhancement  Hyperintensity left lateral cerebellum unchanged. Negative for disc protrusion or spinal stenosis. IMPRESSION: No acute injury in the cervical spine Improvement in cord lesions compatible with multiple sclerosis compared with 1 year ago. Electronically Signed   By: Marlan Palauharles  Clark M.D.   On: 10/29/2015 16:14   Dg Chest Port 1 View  Result Date: 10/29/2015 CLINICAL DATA:  Chest soreness EXAM: PORTABLE CHEST 1 VIEW COMPARISON:  None. FINDINGS: The heart size and mediastinal contours are within normal limits. Both lungs are clear. The visualized skeletal structures are unremarkable. IMPRESSION: No active disease. Electronically Signed   By: Signa Kellaylor  Stroud M.D.   On: 10/29/2015 12:31     Subjective: Patient is feeling much better, denies any nausea or vomiting, no chest pain or dyspnea. She does have persistent generalized weakness. Mentions having a urinary and bowel incontinence.   Discharge Exam: Vitals:   11/02/15 2239 11/03/15 0519  BP: 122/76 137/78  Pulse: 95 85  Resp: 16 16  Temp: 98.4 F (36.9 C) 98 F (36.7 C)   Vitals:   11/02/15 0452 11/02/15 1339 11/02/15 2239 11/03/15 0519  BP: 129/77 114/84 122/76 137/78  Pulse: (!) 107 (!) 117 95 85  Resp: 16 16 16 16   Temp: 98.2 F (36.8 C) 97.8 F (36.6 C) 98.4 F (36.9 C) 98 F (36.7 C)  TempSrc: Oral Oral Oral Oral  SpO2: 100% 100% 100% 100%  Weight:      Height:        General: Pt is alert, awake, not in acute distress Cardiovascular: RRR, S1/S2 +, no rubs, no gallops Respiratory: CTA bilaterally, no wheezing, no rhonchi Abdominal: Soft, NT, ND, bowel sounds + Extremities: no edema, no cyanosis Positive generalized weakness.     The results of significant diagnostics from this hospitalization (including imaging, microbiology, ancillary and laboratory) are listed below for reference.     Microbiology: Recent Results (from the past 240 hour(s))  Urine culture     Status: Abnormal   Collection Time: 10/29/15  4:25 PM   Result Value Ref Range Status   Specimen Description URINE, RANDOM  Final   Special Requests NONE  Final   Culture 60,000 COLONIES/mL KLEBSIELLA PNEUMONIAE (A)  Final   Report Status 11/03/2015 FINAL  Final   Organism ID, Bacteria KLEBSIELLA PNEUMONIAE (A)  Final      Susceptibility   Klebsiella pneumoniae - MIC*    AMPICILLIN >=32 RESISTANT Resistant     CEFAZOLIN <=4 SENSITIVE Sensitive     CEFTRIAXONE <=1 SENSITIVE Sensitive     CIPROFLOXACIN <=0.25 SENSITIVE Sensitive     GENTAMICIN <=1 SENSITIVE Sensitive     IMIPENEM <=0.25 SENSITIVE Sensitive     NITROFURANTOIN 128 RESISTANT Resistant     TRIMETH/SULFA <=20 SENSITIVE Sensitive     AMPICILLIN/SULBACTAM 4 SENSITIVE Sensitive     PIP/TAZO <=4 SENSITIVE Sensitive     Extended ESBL NEGATIVE Sensitive     * 60,000 COLONIES/mL KLEBSIELLA PNEUMONIAE  Culture, blood (Routine X 2) w Reflex to ID Panel     Status: None (Preliminary result)  Collection Time: 10/29/15  5:23 PM  Result Value Ref Range Status   Specimen Description BLOOD RIGHT [facility]  Final   Special Requests BOTTLES DRAWN AEROBIC AND ANAEROBIC 5CC  Final   Culture   Final    NO GROWTH 4 DAYS Performed at [facility]    Report Status PENDING  Incomplete  Culture, blood (Routine X 2) w Reflex to ID Panel     Status: None (Preliminary result)   Collection Time: 10/29/15  5:23 PM  Result Value Ref Range Status   Specimen Description BLOOD RIGHT AC  Final   Special Requests BOTTLES DRAWN AEROBIC AND ANAEROBIC 5CC  Final   Culture   Final    NO GROWTH 4 DAYS Performed at [facility]    Report Status PENDING  Incomplete     Labs: BNP (last 3 results) No results for input(s): BNP in the last 8760 hours. Basic Metabolic Panel:  Recent Labs Lab 10/29/15 1126 10/29/15 2053 10/31/15 0415 11/03/15 0423  NA 131* 138 138 140  K 4.8 4.2 3.9 3.4*  CL 102 106 107 105  CO2 18* 23 27 30   GLUCOSE 102* 109* 107* 101*  BUN 42* 25* 21* 9  CREATININE 0.83  0.70 0.79 0.49  CALCIUM 9.0 8.8* 8.3* 8.6*  MG  --  2.0 1.8  --   PHOS  --  2.4* 2.7  --    Liver Function Tests:  Recent Labs Lab 10/29/15 1126 10/31/15 0415  AST 13*  --   ALT 13*  --   ALKPHOS 90  --   BILITOT 0.7  --   PROT 7.3  --   ALBUMIN 3.8 2.9*    Recent Labs Lab 10/29/15 1126  LIPASE 32   No results for input(s): AMMONIA in the last 168 hours. CBC:  Recent Labs Lab 10/29/15 1126 10/29/15 2053 10/31/15 0415 11/01/15 1016 11/03/15 0423  WBC 20.4* 21.6* 12.6* 15.0* 13.5*  NEUTROABS 16.9*  --  8.5* 11.4* 9.9*  HGB 15.0 13.9 11.7* 11.7* 11.5*  HCT 43.8 40.7 34.5* 34.6* 33.7*  MCV 91.3 90.4 93.8 93.3 92.3  PLT 614* 582* 511* 449* 446*   Cardiac Enzymes: No results for input(s): CKTOTAL, CKMB, CKMBINDEX, TROPONINI in the last 168 hours. BNP: Invalid input(s): POCBNP CBG:  Recent Labs Lab 11/01/15 0733 11/02/15 0719 11/03/15 0809  GLUCAP 94 137* 94   D-Dimer No results for input(s): DDIMER in the last 72 hours. Hgb A1c No results for input(s): HGBA1C in the last 72 hours. Lipid Profile No results for input(s): CHOL, HDL, LDLCALC, TRIG, CHOLHDL, LDLDIRECT in the last 72 hours. Thyroid function studies No results for input(s): TSH, T4TOTAL, T3FREE, THYROIDAB in the last 72 hours.  Invalid input(s): FREET3 Anemia work up No results for input(s): VITAMINB12, FOLATE, FERRITIN, TIBC, IRON, RETICCTPCT in the last 72 hours. Urinalysis    Component Value Date/Time   COLORURINE RED (A) 10/29/2015 1555   APPEARANCEUR CLOUDY (A) 10/29/2015 1555   LABSPEC 1.020 10/29/2015 1555   PHURINE 5.5 10/29/2015 1555   GLUCOSEU NEGATIVE 10/29/2015 1555   HGBUR LARGE (A) 10/29/2015 1555   BILIRUBINUR MODERATE (A) 10/29/2015 1555   KETONESUR >80 (A) 10/29/2015 1555   PROTEINUR 100 (A) 10/29/2015 1555   NITRITE POSITIVE (A) 10/29/2015 1555   LEUKOCYTESUR MODERATE (A) 10/29/2015 1555   Sepsis Labs Invalid input(s): PROCALCITONIN,  WBC,   LACTICIDVEN Microbiology Recent Results (from the past 240 hour(s))  Urine culture     Status: Abnormal  Collection Time: 10/29/15  4:25 PM  Result Value Ref Range Status   Specimen Description URINE, RANDOM  Final   Special Requests NONE  Final   Culture 60,000 COLONIES/mL KLEBSIELLA PNEUMONIAE (A)  Final   Report Status 11/03/2015 FINAL  Final   Organism ID, Bacteria KLEBSIELLA PNEUMONIAE (A)  Final      Susceptibility   Klebsiella pneumoniae - MIC*    AMPICILLIN >=32 RESISTANT Resistant     CEFAZOLIN <=4 SENSITIVE Sensitive     CEFTRIAXONE <=1 SENSITIVE Sensitive     CIPROFLOXACIN <=0.25 SENSITIVE Sensitive     GENTAMICIN <=1 SENSITIVE Sensitive     IMIPENEM <=0.25 SENSITIVE Sensitive     NITROFURANTOIN 128 RESISTANT Resistant     TRIMETH/SULFA <=20 SENSITIVE Sensitive     AMPICILLIN/SULBACTAM 4 SENSITIVE Sensitive     PIP/TAZO <=4 SENSITIVE Sensitive     Extended ESBL NEGATIVE Sensitive     * 60,000 COLONIES/mL KLEBSIELLA PNEUMONIAE  Culture, blood (Routine X 2) w Reflex to ID Panel     Status: None (Preliminary result)   Collection Time: 10/29/15  5:23 PM  Result Value Ref Range Status   Specimen Description BLOOD RIGHT AC  Final   Special Requests BOTTLES DRAWN AEROBIC AND ANAEROBIC 5CC  Final   Culture   Final    NO GROWTH 4 DAYS Performed at [facility]    Report Status PENDING  Incomplete  Culture, blood (Routine X 2) w Reflex to ID Panel     Status: None (Preliminary result)   Collection Time: 10/29/15  5:23 PM  Result Value Ref Range Status   Specimen Description BLOOD RIGHT [facility]  Final   Special Requests BOTTLES DRAWN AEROBIC AND ANAEROBIC 5CC  Final   Culture   Final    NO GROWTH 4 DAYS Performed at [facility]    Report Status PENDING  Incomplete     Time coordinating discharge: 45 minutes  SIGNED:   Coralie Keens, MD  [facility] 11/03/2015, 10:01 AM Pager   If 7PM-7AM, please contact  night-coverage [email or web address] Password [email or web address]

## 2015-11-03 NOTE — Clinical Social Work Placement (Signed)
   CLINICAL SOCIAL WORK PLACEMENT  NOTE  Date:  11/03/2015  Patient Details  Name: Crystal Park MRN: [id number] Date of Birth: 10/09/1960  Clinical Social Work is seeking post-discharge placement for this patient at the Skilled  Nursing Facility level of care (*CSW will initial, date and re-position this form in  chart as items are completed):  Yes   Patient/family provided with [facility] Clinical Social Work Department's list of facilities offering this level of care within the geographic area requested by the patient (or if unable, by the patient's family).  Yes   Patient/family informed of their freedom to choose among providers that offer the needed level of care, that participate in Medicare, Medicaid or managed care program needed by the patient, have an available bed and are willing to accept the patient.  Yes   Patient/family informed of 's ownership interest in [facility] and [facility], as well as of the fact that they are under no obligation to receive care at these facilities.  PASRR submitted to EDS on 11/01/15     PASRR number received on 11/01/15     Existing PASRR number confirmed on       FL2 transmitted to all facilities in geographic area requested by pt/family on 11/01/15     FL2 transmitted to all facilities within larger geographic area on       Patient informed that his/her managed care company has contracts with or will negotiate with certain facilities, including the following:        Yes   Patient/family informed of bed offers received.  [facility]     Physician recommends and patient chooses bed at      [facility].  Patient to be transferred to facility by CAR     Patient family notified on 11/03/15 of transfer.  Name of family member notified:  SPOUSE     PHYSICIAN       Additional Comment: Pt / spouse are in agreement  with d/c to [facility] today. Spouse prefers to transport pt by car. [employer] has provided authorization for SNF placement. D/C Summary has been sent to SNF for review. No scripts printed. # for report provided to nsg. D/C packet provided to pt. Pt will be d/c tonite following MRI. SNF is aware and in agreement with plan.   _______________________________________________ Royetta AsalHaidinger, Raynetta Osterloh Lee, LCSW  [phone] 11/03/2015, 12:11 PM

## 2015-11-03 NOTE — Telephone Encounter (Signed)
I spoke to her hospitalist. She had Klebsiella sepsis from a UTI she was admitted to [facility] and today he is being transferred to rehabilitation..     She has had urinary and bowel incontinence. MRI of the brain and cervical spine did not show any new MS lesions. I recommended that an MRI of the thoracic spine be performed. It is very possible that the symptoms are a sequela of her infection but I want to make sure nothing is going on new in the thoracic spine.

## 2015-11-03 NOTE — Telephone Encounter (Signed)
I have spoken with Mardelle Matte this morning and advised, that per RAS, sx. may be related to urosepsis or MS.  To be safe, he has already spoken with the hospitalist this morning and requested an MRI T-spine.  He would like to see Quintera back in the office about a wk. after she is released from rehab.  Mardelle Matte verbalized understanding of same/fim

## 2015-11-03 NOTE — Progress Notes (Signed)
Physical Therapy Treatment [name] Bradshaw MRN: [id number] DOB: 1960-10-25 Today's Date: 11/03/2015    History of Present Illness 55 y.o. female with medical history significant for MS, gait imbalance who [facility] with worsening weakness, nausea and vomiting over past few days prior to this admission. She felt weak and fatigued.  Found to have UTI    PT Comments    Assisted OOB to amb to bathroom + 2 HHA then amb around room before assisting back to bed.     Follow Up Recommendations  Supervision/Assistance - 24 hour;SNF     Equipment Recommendations       Recommendations for Other Services       Precautions / Restrictions Precautions Precautions: Fall Precaution Comments: recent fall at home injuring head/neck per patient Restrictions Weight Bearing Restrictions: No    Mobility  Bed Mobility Overal bed mobility: Needs Assistance Bed Mobility: Supine to Sit     Supine to sit: Min assist;+2 for safety/equipment;HOB elevated     General bed mobility comments: assist with LEs and trunk, cues to self assist plus increased time  Transfers Overall transfer level: Needs assistance Equipment used: 2 person hand held assist Transfers: Sit to/from Stand Sit to Stand: Min assist;Mod assist Stand pivot transfers: Min assist;Mod assist       General transfer comment: pt felt more secure using 2 HHA vs walker.  Assisted OOB and on/off toilet.  VC's for turn completion and assisted with hygiene as pt was unable to self perform while standing.    Ambulation/Gait Ambulation/Gait assistance: Min assist;Mod assist Ambulation Distance (Feet): 22 Feet Assistive device: 2 person hand held assist Gait Pattern/deviations: Step-through pattern;Decreased stance time - right Gait velocity: decreased   General Gait Details: used + 2 hand held assist per pt request (felt more secure).  assisted with amb to bathroom and around room.      Stairs            Wheelchair Mobility    Modified Rankin (Stroke Patients Only)       Balance                                    Cognition                            Exercises      General Comments        Pertinent Vitals/Pain      Home Living                      Prior Function            PT Goals (current goals can now be found in the care plan section) Progress towards PT goals: Progressing toward goals    Frequency  Min 3X/week    PT Plan Current plan remains appropriate    Co-evaluation             End of Session Equipment Utilized During Treatment: Gait belt Activity Tolerance: Patient tolerated treatment well;Patient limited by fatigue Patient left: in bed;with call bell/phone within reach;with family/visitor present;with bed alarm set     Time: 2952-84131034-1102 PT Time Calculation (min) (ACUTE ONLY): 28 min  Charges:  $Gait Training: 8-22 mins $Therapeutic Activity: 8-22 mins  G Codes:      Rica Koyanagi  PTA [facility]  Acute  Rehab Pager      [phone]

## 2015-11-04 LAB — GLUCOSE, CAPILLARY: Glucose-Capillary: 99 mg/dL (ref 65–99)

## 2015-11-04 MED ORDER — CIPROFLOXACIN HCL 250 MG PO TABS
250.0000 mg | ORAL_TABLET | Freq: Two times a day (BID) | ORAL | Status: DC
  Administered 2015-11-04: 250 mg via ORAL
  Filled 2015-11-04: qty 1

## 2015-11-04 NOTE — Progress Notes (Signed)
PROGRESS NOTE    Crystal Park  WUJ:[id number] DOB: [birth year] DOA: 10/29/2015 PCP:  Duane Lope, MD   Brief Narrative: 55 y.o.femalewith medical history significant for MS, gait imbalance who presented from home to [facility] ED with worsening weakness, nausea and vomiting over past few days prior to this admission. She felt weak and fatigued. Patient reports no associated abdominal pain, no fevers or chills. No diarrhea or constipation. No reports of chest pain, shortness of breath or palpitations.     Assessment & Plan:   Principal Problem:   UTI (lower urinary tract infection) Active Problems:   MS (multiple sclerosis) (HCC)   Leukocytosis   Nausea without vomiting   Dehydration  1. Urinary tract infection. Urine culture positive for klebsiella pneumoniae, will continue antibiotic therapy with fluoroquinoles.  Continue fesoteradine.   2. Deconditioning. Will continue physical therapy, plan for [facility].  3. Multiple sclerosis. DVT px, continue amantadine and tecfidera. I spoke with Dr. Linden Dolin yesterday in regards, patient's bowel and urinary incontinence, recommendations to do thoracic MRI. Noted results with  Numerous demyelinating lesions along the thoracic spinal cord, no active lesions, T10-T11 with moderate spinal canal stenosis.  Case dicussed with Dr Epimenio Foot, including MRI results, will discharge patient to snf today.    DVT prophylaxis: scd Code Status:  full Family Communication: I spoke with the patient's family at the bedside and all questions were addressed.  Disposition Plan: SNF   Consultants:      Procedures:   Antimicrobials:   ciprofloxacin   Subjective: Patient with persistent bowel and urinary incontinence, no chest pain or dyspnea, no nausea or vomiting. No fever or chills.   Objective: Vitals:   11/03/15 0930 11/03/15 1400 11/03/15 2255 11/04/15 0612  BP: 121/74 129/82 125/85 119/90  Pulse: (!) 102 (!) 107 (!) 105 100  Resp: 18  16 16 16   Temp: 98.2 F (36.8 C) 97.7 F (36.5 C) 98.1 F (36.7 C) 97.9 F (36.6 C)  TempSrc: Oral Oral Oral Oral  SpO2: 99% 100% 97% 100%  Weight:      Height:        Intake/Output Summary (Last 24 hours) at 11/04/15 0759 Last data filed at 11/04/15 0600  Gross per 24 hour  Intake             1130 ml  Output                0 ml  Net             1130 ml   Filed Weights   10/29/15 2023 10/30/15 0530 11/01/15 0641  Weight: 54.4 kg (120 lb) 57.7 kg (127 lb 3.2 oz) 59.5 kg (131 lb 1.6 oz)    Examination:  General exam: Not in pain or dyspnea. E ENT: oral mucosa moist. Mild pallor.  Respiratory system: Clear to auscultation. Respiratory effort normal. Cardiovascular system: S1 & S2 heard, RRR. No JVD, murmurs, rubs, gallops or clicks. No pedal edema. Gastrointestinal system: Abdomen is nondistended, soft and nontender. No organomegaly or masses felt. Normal bowel sounds heard. Central nervous system: Alert and oriented. No focal neurological deficits. Extremities: Generalized weakness.  Skin: No rashes, lesions or ulcers Psychiatry: Judgement and insight appear normal. Mood & affect appropriate.     Data Reviewed: I have personally reviewed following labs and imaging studies  CBC:  Recent Labs Lab 10/29/15 1126 10/29/15 2053 10/31/15 0415 11/01/15 1016 11/03/15 0423  WBC 20.4* 21.6* 12.6* 15.0* 13.5*  NEUTROABS 16.9*  --  8.5* 11.4* 9.9*  HGB 15.0 13.9 11.7* 11.7* 11.5*  HCT 43.8 40.7 34.5* 34.6* 33.7*  MCV 91.3 90.4 93.8 93.3 92.3  PLT 614* 582* 511* 449* 446*   Basic Metabolic Panel:  Recent Labs Lab 10/29/15 1126 10/29/15 2053 10/31/15 0415 11/03/15 0423  NA 131* 138 138 140  K 4.8 4.2 3.9 3.4*  CL 102 106 107 105  CO2 18* 23 27 30   GLUCOSE 102* 109* 107* 101*  BUN 42* 25* 21* 9  CREATININE 0.83 0.70 0.79 0.49  CALCIUM 9.0 8.8* 8.3* 8.6*  MG  --  2.0 1.8  --   PHOS  --  2.4* 2.7  --    GFR: Estimated Creatinine Clearance: 73.2 mL/min (by C-G  formula based on SCr of 0.8 mg/dL). Liver Function Tests:  Recent Labs Lab 10/29/15 1126 10/31/15 0415  AST 13*  --   ALT 13*  --   ALKPHOS 90  --   BILITOT 0.7  --   PROT 7.3  --   ALBUMIN 3.8 2.9*    Recent Labs Lab 10/29/15 1126  LIPASE 32   No results for input(s): AMMONIA in the last 168 hours. Coagulation Profile: No results for input(s): INR, PROTIME in the last 168 hours. Cardiac Enzymes: No results for input(s): CKTOTAL, CKMB, CKMBINDEX, TROPONINI in the last 168 hours. BNP (last 3 results) No results for input(s): PROBNP in the last 8760 hours. HbA1C: No results for input(s): HGBA1C in the last 72 hours. CBG:  Recent Labs Lab 11/01/15 0733 11/02/15 0719 11/03/15 0809 11/04/15 0730  GLUCAP 94 137* 94 99   Lipid Profile: No results for input(s): CHOL, HDL, LDLCALC, TRIG, CHOLHDL, LDLDIRECT in the last 72 hours. Thyroid Function Tests: No results for input(s): TSH, T4TOTAL, FREET4, T3FREE, THYROIDAB in the last 72 hours. Anemia Panel: No results for input(s): VITAMINB12, FOLATE, FERRITIN, TIBC, IRON, RETICCTPCT in the last 72 hours. Sepsis Labs:  Recent Labs Lab 10/29/15 1728  LATICACIDVEN 0.93    Recent Results (from the past 240 hour(s))  Urine culture     Status: Abnormal   Collection Time: 10/29/15  4:25 PM  Result Value Ref Range Status   Specimen Description URINE, RANDOM  Final   Special Requests NONE  Final   Culture 60,000 COLONIES/mL KLEBSIELLA PNEUMONIAE (A)  Final   Report Status 11/03/2015 FINAL  Final   Organism ID, Bacteria KLEBSIELLA PNEUMONIAE (A)  Final      Susceptibility   Klebsiella pneumoniae - MIC*    AMPICILLIN >=32 RESISTANT Resistant     CEFAZOLIN <=4 SENSITIVE Sensitive     CEFTRIAXONE <=1 SENSITIVE Sensitive     CIPROFLOXACIN <=0.25 SENSITIVE Sensitive     GENTAMICIN <=1 SENSITIVE Sensitive     IMIPENEM <=0.25 SENSITIVE Sensitive     NITROFURANTOIN 128 RESISTANT Resistant     TRIMETH/SULFA <=20 SENSITIVE  Sensitive     AMPICILLIN/SULBACTAM 4 SENSITIVE Sensitive     PIP/TAZO <=4 SENSITIVE Sensitive     Extended ESBL NEGATIVE Sensitive     * 60,000 COLONIES/mL KLEBSIELLA PNEUMONIAE  Culture, blood (Routine X 2) w Reflex to ID Panel     Status: None   Collection Time: 10/29/15  5:23 PM  Result Value Ref Range Status   Specimen Description [facility]LOOD RIGHT [facility]  Final   Special Requests BOTTLES DRAWN AEROBIC AND ANAEROBIC 5CC  Final   Culture   Final    NO GROWTH 5 DAYS [facility]    Report Status 11/03/2015 FINAL  Final  Culture, blood (Routine X 2) w Reflex to ID Panel     Status: None   Collection Time: 10/29/15  5:23 PM  Result Value Ref Range Status   Specimen Description BLOOD RIGHT [facility]  Final   Special Requests BOTTLES DRAWN AEROBIC AND ANAEROBIC 5CC  Final   Culture   Final    NO GROWTH 5 DAYS Performed at [facility]    Report Status 11/03/2015 FINAL  Final         Radiology Studies: Mr Thoracic Spine W Wo Contrast  Result Date: 11/03/2015 CLINICAL DATA:  Weakness in lower extremities.  Multiple sclerosis EXAM: MRI THORACIC SPINE WITHOUT AND WITH CONTRAST TECHNIQUE: Multiplanar and multiecho pulse sequences of the thoracic spine were obtained without and with intravenous contrast. CONTRAST:  26mL MULTIHANCE GADOBENATE DIMEGLUMINE 529 MG/ML IV SOLN COMPARISON:  Cervical spine MRI 10/20/2014 FINDINGS: Alignment:  Thoracic kyphosis is preserved.  No listhesis. Vertebrae:  Normal bone marrow signal. Spinal cord: The STIR sequence shows multiple focal areas of hyperintense T2 weighted signal within the thoracic spinal cord, at the T4, T6, T7-8, T9, T11 and T12 levels. These lesions are confirmed on axial T2 weighted imaging. There is no abnormal contrast enhancement to suggest active demyelination. Disc levels: There is a large left central disc extrusion with minimal superior migration at the T10-11 level, which effaces the left aspect of the ventral thecal sac  and deforms the left anterior aspect of the spinal cord, with mild spinal canal stenosis. No other significant disc disease, spinal canal stenosis or neural foraminal narrowing. Paraspinal soft tissues: The visualized lungs, mediastinum and retroperitoneum are normal. IMPRESSION: 1. Numerous demyelinating lesions along the thoracic spinal cord, consistent with being diagnosis of multiple sclerosis. 2. No active demyelinating lesions. 3. Left central disc extrusion at T10-11 with resultant moderate spinal canal stenosis and deformity of the left ventral aspect of the spinal cord. Electronically Signed   By: Deatra Robinson M.D.   On: 11/03/2015 23:48        Scheduled Meds: . amantadine  100 mg Oral BID  . cholecalciferol  1,000 Units Oral Daily  . ciprofloxacin  250 mg Oral BID  . Dimethyl Fumarate  1 capsule Oral BID  . fesoterodine  4 mg Oral Daily  . multivitamin with minerals  1 tablet Oral Daily   Continuous Infusions:    LOS: 6 days        Vihaan Gloss Annett Gula, MD [facility] Pager [phone]  If 7PM-7AM, please contact night-coverage [email or web address] Password [email or web address] 11/04/2015, 7:59 AM

## 2015-11-04 NOTE — Progress Notes (Addendum)
Pt and family members reassured that patient will go down for MRI tonight after IV line is re-established. IV loss around noon, per dayshift nurse.  Transported Pt down to MRI. MRI completed results posted and copy made and placed to chart.  Pt bed at [facility], phone#[phone]   Also to follow up with day shift nurse. Noted in redness in groin and buttocks area..patient incontinent of stool and bladder. Personal peri care given and barrier cream applied.

## 2015-11-04 NOTE — Progress Notes (Signed)
CSW assisting with d/c planning . Pt was unable to d/c yesterday, as planned due to medical concerns. Pt is ready to d/c today. Updated d/c summary sent to SNF for review. Spouse will transport pt to SNF. D/C packet provided to spouse. # for report provided to nsg.  Cori Razor LCSW [phone]

## 2015-11-14 ENCOUNTER — Telehealth: Payer: Self-pay | Admitting: Neurology

## 2015-11-14 NOTE — Telephone Encounter (Signed)
[facility] [employer] Ampyra manufacturer called stating pt was hospitalized this month while on there product. They would like to speak with the physician about pt. Please call [phone] case ref: [phone]

## 2015-11-23 ENCOUNTER — Telehealth: Payer: Self-pay | Admitting: Neurology

## 2015-11-23 NOTE — Telephone Encounter (Signed)
[facility] [phone] requests call back from nurse Faith.

## 2015-11-23 NOTE — Telephone Encounter (Signed)
I have spoken with Crystal SchaumannSerita this am.  She sts. Crystal Park's rehab md rquests appt. with [facility] this week for MS exacerbation.  She is not able to be more specific.  I offered appt. this afternoon but there were transportation issues.  Appt. given tomorrow afternoon/fim

## 2015-11-24 ENCOUNTER — Encounter: Payer: Self-pay | Admitting: Neurology

## 2015-11-24 ENCOUNTER — Ambulatory Visit (INDEPENDENT_AMBULATORY_CARE_PROVIDER_SITE_OTHER): Payer: Managed Care, Other (non HMO) | Admitting: Neurology

## 2015-11-24 VITALS — BP 100/60 | HR 68 | Resp 16 | Ht 65.0 in | Wt 123.6 lb

## 2015-11-24 DIAGNOSIS — N390 Urinary tract infection, site not specified: Secondary | ICD-10-CM | POA: Diagnosis not present

## 2015-11-24 DIAGNOSIS — G35 Multiple sclerosis: Secondary | ICD-10-CM

## 2015-11-24 DIAGNOSIS — R531 Weakness: Secondary | ICD-10-CM | POA: Insufficient documentation

## 2015-11-24 DIAGNOSIS — R269 Unspecified abnormalities of gait and mobility: Secondary | ICD-10-CM | POA: Diagnosis not present

## 2015-11-24 DIAGNOSIS — R5383 Other fatigue: Secondary | ICD-10-CM | POA: Diagnosis not present

## 2015-11-24 DIAGNOSIS — R3915 Urgency of urination: Secondary | ICD-10-CM | POA: Insufficient documentation

## 2015-11-24 NOTE — Progress Notes (Signed)
pomj.   [facility]  PATIENT: Crystal Park DOB: 1960/12/27  REFERRING DOCTOR OR PCP:  Narda Bonds (ENT referred)   [name] (PCP) SOURCE: paitent and records from Dr. Ezzard Standing.    CT images on PACS  _________________________________   HISTORICAL  CHIEF COMPLAINT:  Chief Complaint  Patient presents with  . Multiple Sclerosis    She was recently hospitalized for urosepsis.  Now in rehab at [facility] here in [place].  Sts. she has been doing better--has been participating in PT, and was walking with a cane, even climbing stairs.  Yesterday she woke up and was again significantly weaker in her legs, and abd. pain.  She is improving now.  Sts. u/a was neg, cbc was neg.  She has an appt. with her ob/gyn to discuss abd. pain/periods.Chucky May    HISTORY OF PRESENT ILLNESS:  Crystal Park is a 55 year old woman with multiple sclerosis. She was recently hospitalized for urosepsis and now is in Rehab.   She was doing well with therapy getting back to a cane intermittently.  Then yesterday she woke up and felt she could not move her legs or arms.,  Then she was able to move her fingers and toes.      By later in the afternoon, she was better and is much better this morning.   She was able to walk to the bathroom with a cane to get to the bathroom today.   She estimates this spell lasted about 4 or 5 hours before she started to note significant improvement. During that time, there was not limb pain though she did have abdominal tightness and pain and also had left jaw pain.  Today, she feels she is better than yesterday and she was the day before yesterday.  She was able to walk without a cane before her recent illness. She feels that the left leg has improved close to baseline but the right leg has not improved nearly as much.   Ampyra had not helped her when she tried after her last visit.  I personally reviewed the MRI of the brain and cervical spine  and thoracic spine from late August. The MRI of the brain did not include contrast but when compared to her previous MRI, I do not see any definite new lesions in the MRI of the cervical spine was essentially unchanged showing several in the spinal cord. The MRI of the thoracic spine also showed foci within the spinal cord. We do not have a comparison T-spine. She also had a lower thoracic herniated disc that started the left side of the thecal sac without causing spinal cord compression. Adajcent spinal cord appeared normal.    She is very tired and feels focus is worse.      Vertigo:   She still has vertigo, better if she focuses on something in front of her.   In the past, she saw her ENT and had ENG testing done that was reportedly consistent with central vertigo.  Austin Miles and other exercises helped minimally and lorazepam, meclizine and valium have not helped.   Dramamine helps some.    Vision:   She denies any difficulty with her vision.   Bladder:   She notes urinary frequency and urgency and feels better function is back to her baseline. Detrol has helped with the urgency. She denies hesitancy.  Fatigue/sleep:   She has fatigue for the past few years that is worse more recently.r. Amantadine has helped the  fatigue some. She would like to continue that medication.     She does not sleep well as she wakes up easily and she needs to use the bathroom.  Mood/Cognitive:   She denies any depression or anxiety but is more irritable at times.   She has not noted any major cognitive issues  MS History:   About 2010, she had an event with vertigo and diplopia that took 6 months to improve.  After that she began to have issues with her legs.   I have reviewed the MRI of the brain and the spinal cord.   The MRI of the spine shows several T2 weighted lesions, some peripherally located consistent with multiple sclerosis. None of the foci enhanced. The MRI of the brain shows multiple foci in the  periventricular, juxtacortical cortical and deep white matter consistent with multiple sclerosis.   There is also a left cerebellar focus that could represent remote stroke (it is also present on CT scan from 2010)    REVIEW OF SYSTEMS: Constitutional: No fevers, chills, sweats, or change in appetite.   She notes fatigue Eyes: No visual changes, double vision, eye pain Ear, nose and throat: No hearing loss, ear pain, nasal congestion, sore throat.   Has vertigo Cardiovascular: No chest pain, palpitations Respiratory: No shortness of breath at rest or with exertion.   No wheezes GastrointestinaI: No nausea, vomiting, diarrhea, abdominal pain, fecal incontinence Genitourinary: As above. No UTIs. Musculoskeletal: No neck pain, back pain Integumentary: No rash, pruritus, skin lesions Neurological: as above Psychiatric: No depression at this time.  Mild anxiety Endocrine: No palpitations, diaphoresis, change in appetite, change in weigh or increased thirst Hematologic/Lymphatic: No anemia, purpura, petechiae. Allergic/Immunologic: No itchy/runny eyes, nasal congestion, recent allergic reactions, rashes  ALLERGIES: No Known Allergies  HOME MEDICATIONS:  Current Outpatient Prescriptions:  .  amantadine (SYMMETREL) 100 MG capsule, Take 1 capsule (100 mg total) by mouth 2 (two) times daily., Disp: 180 capsule, Rfl: 3 .  cholecalciferol (VITAMIN D) 1000 UNITS tablet, Take 1,000 Units by mouth daily. otc vit. d 5,000iu daily/fim, Disp: , Rfl:  .  CRANBERRY PO, Take 1 capsule by mouth daily., Disp: , Rfl:  .  dimenhyDRINATE (DRAMAMINE) 50 MG tablet, Take 50 mg by mouth every 8 (eight) hours as needed for dizziness., Disp: , Rfl:  .  Multiple Vitamin (MULTIVITAMIN) tablet, Take 1 tablet by mouth daily., Disp: , Rfl:  .  TECFIDERA 240 MG CPDR, TAKE 1 CAPSULE BY MOUTH TWICE DAILY, Disp: 60 capsule, Rfl: 10 .  tolterodine (DETROL LA) 4 MG 24 hr capsule, Take 4 mg by mouth daily. Reported on  04/26/2015, Disp: , Rfl:  .  ciprofloxacin (CIPRO) 500 MG tablet, Take 0.5 tablets (250 mg total) by mouth 2 (two) times daily. (Patient not taking: Reported on 11/24/2015), Disp: 10 tablet, Rfl: 0 .  dalfampridine 10 MG TB12, Take 10 mg by mouth 2 (two) times daily., Disp: , Rfl:  .  progesterone (PROMETRIUM) 200 MG capsule, Take 200 mg by mouth daily. For 10 days, Disp: , Rfl:  No current facility-administered medications for this visit.   Facility-Administered Medications Ordered in Other Visits:  .  gadopentetate dimeglumine (MAGNEVIST) injection 11 mL, 11 mL, Intravenous, Once PRN, Asa Lente, MD .  gadopentetate dimeglumine (MAGNEVIST) injection 12 mL, 12 mL, Intravenous, Once PRN, Asa Lente, MD  PAST MEDICAL HISTORY: Past Medical History:  Diagnosis Date  . Benign paroxysmal positional vertigo 10/12/2014  . MS (multiple sclerosis) (HCC)  diagnosed 9/16    PAST SURGICAL HISTORY: Past Surgical History:  Procedure Laterality Date  . OOPHORECTOMY Left    2013    FAMILY HISTORY: No family history on file.  SOCIAL HISTORY:  Social History   Social History  . Marital status: Married    Spouse name: N/A  . Number of children: N/A  . Years of education: N/A   Occupational History  . Not on file.   Social History Main Topics  . Smoking status: Never Smoker  . Smokeless tobacco: Never Used  . Alcohol use No  . Drug use: No  . Sexual activity: Not Currently   Other Topics Concern  . Not on file   Social History Narrative  . No narrative on file     PHYSICAL EXAM  Vitals:   11/24/15 1532  BP: 100/60  Pulse: 68  Resp: 16  Weight: 123 lb 9.6 oz (56.1 kg)  Height: 5\' 5"  (1.651 m)    Body mass index is 20.57 kg/m.   General: The patient is well-developed and well-nourished and in no acute distress.   Hearing is symmetric.  Neurologic Exam  Mental status: The patient is alert and oriented x 3 at the time of the examination. The patient has  apparent normal recent and remote memory, with an apparently normal attention span and concentration ability.   Speech is normal.  Cranial nerves: Extraocular movements are full.   There is good facial sensation to soft touch bilaterally.Facial strength is normal.  Trapezius and sternocleidomastoid strength is normal. No dysarthria is noted.    No obvious hearing deficits are noted.  Motor:  Muscle bulk is normal.   Tone is normal. Strength is  5 / 5 in the arms and 4+/5 in left leg and 3 to 4-/5 in right leg.   Sensory: Sensory testing is intact to touch and vibration sensation in the arms. She has reduced vibration sensation at ankles.  Coordination: Cerebellar testing reveals good finger-nose-finger and reduced heel-to-shin bilaterally.  Gait and station: Station is normal.   Gait requires a cane.  . Romberg is negative.   Reflexes: Deep tendon reflexes are symmetric and 3+ bilaterally  and increased in the knees (with spread).  No ankle clonus.Marland Kitchen.       DIAGNOSTIC DATA (LABS, IMAGING, TESTING) - I reviewed patient records, labs, notes, testing and imaging myself where available.     ASSESSMENT AND PLAN  MS (multiple sclerosis) (HCC)  UTI (lower urinary tract infection)  Gait disturbance  Other fatigue  Urinary urgency   1.   Long discussion about the right leg weakness. Although it temporarily worsened along with her UTI, I am concerned that it has not improved nearly as much as the left side. Therefore, it is possible that she had an other MS exacerbation were no enhancing lesions on the cervical spine MRI. The disc herniation is on the wrong side of her spinal cord to affect the right leg.   If she has any new symptoms, consider a switch to a different disease modifying therapy. 2.    I don't have a good explanationfor the event that happened yesterday weakness for 4 or 5 hours. If symptoms had lasted only a couple minutes, they would've been consistent with sleep paralysis.  Improvement back to baseline today makes a demyelinating etiology or ischemic etiology unlikely. 3.  Continue OTC dramamine for Vertigo.   Characteristics are more consistent with central vertigo. Unfortunately, benzodiazepines have not helped at all. 4.  Continue Tecfidera.    We also discussed other options including ocrelizumab and she will give this some thought. We went over the risks and benefits in detail. 5.   Stay active and exercises tolerated.   I'll have her try Ampyra to see if that will help her walking more. 6.   I will see her back 3 months or sooner if she has new or worsening symptoms.  45 minute face to face evaluation with > 1/2 the time counseling and coordinating care regarding her MS, vertigo and other symptoms.  Cyenna Rebello A. Epimenio Foot, MD, PhD 11/24/2015, 5:46 PM Certified in Neurology, Clinical Neurophysiology, Sleep Medicine, Pain Medicine and Neuroimaging  [facility] [address], [address] [place], [place] [address] [phone]

## 2015-12-05 ENCOUNTER — Ambulatory Visit: Payer: Self-pay | Admitting: Neurology

## 2015-12-08 ENCOUNTER — Ambulatory Visit
Admission: RE | Admit: 2015-12-08 | Discharge: 2015-12-08 | Disposition: A | Discharge status: Home / Self Care | Payer: Managed Care, Other (non HMO) | Source: Ambulatory Visit | Attending: Obstetrics and Gynecology | Admitting: Obstetrics and Gynecology

## 2015-12-08 DIAGNOSIS — R928 Other abnormal and inconclusive findings on diagnostic imaging of breast: Secondary | ICD-10-CM

## 2015-12-09 ENCOUNTER — Ambulatory Visit: Payer: Managed Care, Other (non HMO) | Admitting: Neurology

## 2016-01-04 ENCOUNTER — Other Ambulatory Visit: Payer: Self-pay | Admitting: Neurology

## 2016-02-23 ENCOUNTER — Encounter: Payer: Self-pay | Admitting: Neurology

## 2016-02-23 ENCOUNTER — Ambulatory Visit (INDEPENDENT_AMBULATORY_CARE_PROVIDER_SITE_OTHER): Payer: Managed Care, Other (non HMO) | Admitting: Neurology

## 2016-02-23 VITALS — BP 124/78 | HR 72 | Resp 14 | Ht 65.0 in | Wt 130.5 lb

## 2016-02-23 DIAGNOSIS — R3915 Urgency of urination: Secondary | ICD-10-CM

## 2016-02-23 DIAGNOSIS — R269 Unspecified abnormalities of gait and mobility: Secondary | ICD-10-CM

## 2016-02-23 DIAGNOSIS — G35 Multiple sclerosis: Secondary | ICD-10-CM

## 2016-02-23 DIAGNOSIS — R5383 Other fatigue: Secondary | ICD-10-CM | POA: Diagnosis not present

## 2016-02-23 DIAGNOSIS — F988 Other specified behavioral and emotional disorders with onset usually occurring in childhood and adolescence: Secondary | ICD-10-CM | POA: Insufficient documentation

## 2016-02-23 MED ORDER — MIRABEGRON ER 50 MG PO TB24: 50.0000 mg | ORAL_TABLET | Freq: Every day | ORAL | 11 refills | Status: DC

## 2016-02-23 MED ORDER — AMPHETAMINE-DEXTROAMPHET ER 15 MG PO CP24: 15.0000 mg | ORAL_CAPSULE | ORAL | 0 refills | Status: DC

## 2016-02-23 MED ORDER — DIAZEPAM 5 MG PO TABS: 5.0000 mg | ORAL_TABLET | Freq: Three times a day (TID) | ORAL | 3 refills | Status: DC | PRN

## 2016-02-23 MED ORDER — MIRABEGRON ER 50 MG PO TB24: 50.0000 mg | ORAL_TABLET | Freq: Every day | ORAL | 3 refills | Status: DC

## 2016-02-23 NOTE — Progress Notes (Addendum)
pomj.   [facility]  PATIENT: Crystal Park DOB: 1960-06-05  REFERRING DOCTOR OR PCP:  Crystal Park (ENT referred)   Crystal Park (PCP) SOURCE: paitent and records from Dr. Ezzard Park.    CT images on PACS  _________________________________   HISTORICAL  CHIEF COMPLAINT:  Chief Complaint  Patient presents with  . Multiple Sclerosis    Sts. she continues to tolerate Tecfidera well.  Sts. she continues to have dizziness.  Dramamine does not help very much.  She was not able to tolerate Ampyra--abd. pain and constipation..  She is having increased urinary frequency--up 5-6 times per night.  Sts. short term memory is worse/fim    HISTORY OF PRESENT ILLNESS:  Crystal Park is a 55 year old woman with multiple sclerosis.    She feels worse with poor balance.   MS:   She is on Tecfidera tolerates it well. She has not had any definite exacerbation.  Studies:   The MRI of the brain 10/2015 does not show any definite new lesions and the MRI of the cervical spine  showed several foci in the spinal cord (all chronic). The MRI of the thoracic spine also showed foci within the spinal cord. We do not have a comparison T-spine. She also had a lower thoracic herniated disc that started the left side of the thecal sac without causing spinal cord compression. Adajcent spinal cord appeared normal.    Gait/strength/sensation:  She uses a cane most of the time and a walker when her husband is out of the house.   She has a couple falls.  She got off balanced and couldn't catch herself leading to the fall.   She notes right weakness and clumsiness.     Ampyra had not helped and she stopped.   She also couldn't tolerate it well.    Vertigo:   She still has vertigo, better if she focuses on something in front of her.   In the past, she saw her ENT and had ENG testing done that was reportedly consistent with central vertigo.  Crystal Park and other exercises helped minimally and lorazepam,  meclizine and valium have not helped.   Dramamine slightly helps but not much  Vision:   She denies any difficulty with her vision.   Bladder:   She notes urinary frequency and urgency and feels better function is back to her baseline. Detrol helped with the urgency at first but does not help anymore.   She denies hesitancy.  Fatigue/sleep:   She has fatigue and tried Amantadine with some benefit.   Seh stopped and felt worse so went back on.   She does not sleep well as she wakes up easily and she needs to use the bathroom.  Mood/Cognitive:   She denies any depression or anxiety but is irritable at times.   She has not noted any major cognitive issues  MS History:   About 2010, she had an event with vertigo and diplopia that took 6 months to improve.  After that she began to have issues with her legs.   I have reviewed the MRI of the brain and the spinal cord.   The MRI of the spine shows several T2 weighted lesions, some peripherally located consistent with multiple sclerosis. None of the foci enhanced. The MRI of the brain shows multiple foci in the periventricular, juxtacortical cortical and deep white matter consistent with multiple sclerosis.   There is also a left cerebellar focus that could represent remote stroke (it is  also present on CT scan from 2010)    REVIEW OF SYSTEMS: Constitutional: No fevers, chills, sweats, or change in appetite.   She notes fatigue Eyes: No visual changes, double vision, eye pain Ear, nose and throat: No hearing loss, ear pain, nasal congestion, sore throat.   Has vertigo Cardiovascular: No chest pain, palpitations Respiratory: No shortness of breath at rest or with exertion.   No wheezes GastrointestinaI: No nausea, vomiting, diarrhea, abdominal pain, fecal incontinence Genitourinary: As above. No UTIs. Musculoskeletal: No neck pain, back pain Integumentary: No rash, pruritus, skin lesions Neurological: as above Psychiatric: No depression at this  time.  Mild anxiety Endocrine: No palpitations, diaphoresis, change in appetite, change in weigh or increased thirst Hematologic/Lymphatic: No anemia, purpura, petechiae. Allergic/Immunologic: No itchy/runny eyes, nasal congestion, recent allergic reactions, rashes  ALLERGIES: No Known Allergies  HOME MEDICATIONS:  Current Outpatient Prescriptions:  .  amantadine (SYMMETREL) 100 MG capsule, TAKE 1 CAPSULE (100MG ) BY MOUTH TWICE DAILY, Disp: 180 capsule, Rfl: 3 .  cholecalciferol (VITAMIN D) 1000 UNITS tablet, Take 1,000 Units by mouth daily. otc vit. d 5,000iu daily/fim, Disp: , Rfl:  .  CRANBERRY PO, Take 1 capsule by mouth daily., Disp: , Rfl:  .  dimenhyDRINATE (DRAMAMINE) 50 MG tablet, Take 50 mg by mouth every 8 (eight) hours as needed for dizziness., Disp: , Rfl:  .  Multiple Vitamin (MULTIVITAMIN) tablet, Take 1 tablet by mouth daily., Disp: , Rfl:  .  TECFIDERA 240 MG CPDR, TAKE 1 CAPSULE BY MOUTH TWICE DAILY, Disp: 60 capsule, Rfl: 10 .  amphetamine-dextroamphetamine (ADDERALL XR) 15 MG 24 hr capsule, Take 1 capsule by mouth every morning., Disp: 30 capsule, Rfl: 0 .  diazepam (VALIUM) 5 MG tablet, Take 1 tablet (5 mg total) by mouth every 8 (eight) hours as needed for anxiety., Disp: 90 tablet, Rfl: 3 .  mirabegron ER (MYRBETRIQ) 50 MG TB24 tablet, Take 1 tablet (50 mg total) by mouth daily., Disp: 90 tablet, Rfl: 3 .  tolterodine (DETROL LA) 4 MG 24 hr capsule, Take 4 mg by mouth daily. Reported on 04/26/2015, Disp: , Rfl:  No current facility-administered medications for this visit.   Facility-Administered Medications Ordered in Other Visits:  .  gadopentetate dimeglumine (MAGNEVIST) injection 11 mL, 11 mL, Intravenous, Once PRN, Crystal Lente, MD .  gadopentetate dimeglumine (MAGNEVIST) injection 12 mL, 12 mL, Intravenous, Once PRN, Crystal Lente, MD  PAST MEDICAL HISTORY: Past Medical History:  Diagnosis Date  . Benign paroxysmal positional vertigo 10/12/2014  . MS  (multiple sclerosis) (HCC)    diagnosed 9/16    PAST SURGICAL HISTORY: Past Surgical History:  Procedure Laterality Date  . OOPHORECTOMY Left    2013    FAMILY HISTORY: No family history on file.  SOCIAL HISTORY:  Social History   Social History  . Marital status: Married    Spouse name: N/A  . Number of children: N/A  . Years of education: N/A   Occupational History  . Not on file.   Social History Main Topics  . Smoking status: Never Smoker  . Smokeless tobacco: Never Used  . Alcohol use No  . Drug use: No  . Sexual activity: Not Currently   Other Topics Concern  . Not on file   Social History Narrative  . No narrative on file     PHYSICAL EXAM  Vitals:   02/23/16 1600  BP: 124/78  Pulse: 72  Resp: 14  Weight: 130 lb 8 oz (59.2 kg)  Height:  5\' 5"  (1.651 m)    Body mass index is 21.72 kg/m.   General: The patient is well-developed and well-nourished and in no acute distress.   Hearing is symmetric.  Neurologic Exam  Mental status: The patient is alert and oriented x 3 at the time of the examination. The patient has apparent normal recent and remote memory, with an apparently normal attention span and concentration ability.   Speech is normal.  Cranial nerves: Extraocular movements are full.   There is good facial sensation to soft touch bilaterally.Facial strength is normal.  Trapezius and sternocleidomastoid strength is normal. No dysarthria is noted.    No obvious hearing deficits are noted.  Motor:  Muscle bulk is normal.   Tone is normal. Strength is  5 / 5 in the arms and 4+/5 in left leg and 3 to 4-/5 in right leg.   Sensory: Sensory testing is intact to touch and vibration sensation in the arms. She has reduced vibration sensation at ankles.  Coordination: Cerebellar testing reveals good finger-nose-finger and reduced heel-to-shin bilaterally.  Gait and station: Station is normal.   Gait requires a cane.  . Romberg is negative.    Reflexes: Deep tendon reflexes are symmetric and 3+ bilaterally  and increased in the knees (with spread).  No ankle clonus.Marland Kitchen.       DIAGNOSTIC DATA (LABS, IMAGING, TESTING) - I reviewed patient records, labs, notes, testing and imaging myself where available.     ASSESSMENT AND PLAN  MS (multiple sclerosis) (HCC) - Plan: CBC with Differential/Platelet  Gait disturbance  Other fatigue  Urinary urgency  Attention deficit disorder, unspecified hyperactivity presence   1.   Continue tecfidera.   Check CBC with diff 2.    Add Myrbetriq splint with the bladder.  3.  Change OTC dramamine to valium for Vertigo.   Characteristics are more consistent with central vertigo.  4.   Trial of Adderall for attentional deficit and fatigue can be taken with or without the amantadine. 5.   Stay active and exercises tolerated.    6.   I will see her back 3 months or sooner if she has new or worsening symptoms.   Davonne Baby A. Epimenio FootSater, MD, PhD 02/23/2016, 4:57 PM Certified in Neurology, Clinical Neurophysiology, Sleep Medicine, Pain Medicine and Neuroimaging  [facility] [address], [address] [place], [place] [address] [phone]

## 2016-02-24 LAB — CBC WITH DIFFERENTIAL/PLATELET
Basophils Absolute: 0.1 10*3/uL (ref 0.0–0.2)
Basos: 1 %
EOS (ABSOLUTE): 0.3 10*3/uL (ref 0.0–0.4)
Eos: 3 %
Hematocrit: 35.5 % (ref 34.0–46.6)
Hemoglobin: 11.7 g/dL (ref 11.1–15.9)
Immature Grans (Abs): 0.1 10*3/uL (ref 0.0–0.1)
Immature Granulocytes: 1 %
Lymphocytes Absolute: 1.1 10*3/uL (ref 0.7–3.1)
Lymphs: 10 %
MCH: 27.8 pg (ref 26.6–33.0)
MCHC: 33 g/dL (ref 31.5–35.7)
MCV: 84 fL (ref 79–97)
Monocytes Absolute: 1.2 10*3/uL — ABNORMAL HIGH (ref 0.1–0.9)
Monocytes: 11 %
Neutrophils Absolute: 8.3 10*3/uL — ABNORMAL HIGH (ref 1.4–7.0)
Neutrophils: 74 %
Platelets: 531 10*3/uL — ABNORMAL HIGH (ref 150–379)
RBC: 4.21 x10E6/uL (ref 3.77–5.28)
RDW: 15.1 % (ref 12.3–15.4)
WBC: 11 10*3/uL — ABNORMAL HIGH (ref 3.4–10.8)

## 2016-02-29 ENCOUNTER — Telehealth: Payer: Self-pay | Admitting: *Deleted

## 2016-02-29 NOTE — Telephone Encounter (Signed)
Per Dr Epimenio Foot, LVM informing patient her lab results are okay. Left number for any questions.

## 2016-03-01 ENCOUNTER — Telehealth: Payer: Self-pay | Admitting: *Deleted

## 2016-03-01 NOTE — Telephone Encounter (Signed)
This patient has already failed Detrol, this is very similar to oxybutynin. If Detrol does not work, would doubt that oxybutynin would help, would try to get Myrbetriq if this is possible.

## 2016-03-01 NOTE — Telephone Encounter (Signed)
Patient was given a prescription, from Dr. Epimenio Foot, for [facility] 50mg , daily.  Her insurance will not cover this medication.  The preferred, covered drug is Oxybutynin (immediate or ER).  She requires a 90-day rx sent to [facility], if you feel the the change is appropriate.

## 2016-03-07 ENCOUNTER — Encounter: Payer: Self-pay | Admitting: *Deleted

## 2016-03-07 ENCOUNTER — Other Ambulatory Visit: Payer: Self-pay | Admitting: *Deleted

## 2016-03-07 MED ORDER — OXYBUTYNIN CHLORIDE ER 10 MG PO TB24: 10.0000 mg | ORAL_TABLET | Freq: Every day | ORAL | 3 refills | Status: DC

## 2016-03-07 NOTE — Telephone Encounter (Signed)
Insurance requires failure of Oxybutynin (she has already failed Detrol LA).  Per vo by Dr. Epimenio Foot, provide rx for Oxybutynin ER 10mg , daily.  Patient agreeable and will let us know how this medication works for her.  If it is unhelpful, then Dr. Epimenio Foot will attempt to get Myrbetriq approved for her.

## 2016-03-22 ENCOUNTER — Other Ambulatory Visit: Payer: Self-pay | Admitting: Neurology

## 2016-03-22 ENCOUNTER — Encounter: Payer: Self-pay | Admitting: Neurology

## 2016-03-28 ENCOUNTER — Other Ambulatory Visit: Payer: Self-pay | Admitting: *Deleted

## 2016-03-28 MED ORDER — AMPHETAMINE-DEXTROAMPHET ER 15 MG PO CP24: 15.0000 mg | ORAL_CAPSULE | ORAL | 0 refills | Status: DC

## 2016-04-20 ENCOUNTER — Encounter (HOSPITAL_COMMUNITY): Payer: Self-pay | Admitting: Nurse Practitioner

## 2016-04-20 ENCOUNTER — Inpatient Hospital Stay (HOSPITAL_COMMUNITY)
Admission: EM | Admit: 2016-04-20 | Discharge: 2016-04-24 | DRG: 871 | Disposition: A | Discharge status: Rehabilitation Facility | Payer: Managed Care, Other (non HMO) | Attending: Internal Medicine | Admitting: Internal Medicine

## 2016-04-20 ENCOUNTER — Emergency Department (HOSPITAL_COMMUNITY): Payer: Managed Care, Other (non HMO)

## 2016-04-20 DIAGNOSIS — N136 Pyonephrosis: Secondary | ICD-10-CM | POA: Diagnosis present

## 2016-04-20 DIAGNOSIS — R2689 Other abnormalities of gait and mobility: Secondary | ICD-10-CM

## 2016-04-20 DIAGNOSIS — N179 Acute kidney failure, unspecified: Secondary | ICD-10-CM | POA: Diagnosis present

## 2016-04-20 DIAGNOSIS — F419 Anxiety disorder, unspecified: Secondary | ICD-10-CM | POA: Diagnosis present

## 2016-04-20 DIAGNOSIS — E876 Hypokalemia: Secondary | ICD-10-CM | POA: Diagnosis present

## 2016-04-20 DIAGNOSIS — K5901 Slow transit constipation: Secondary | ICD-10-CM | POA: Diagnosis not present

## 2016-04-20 DIAGNOSIS — Z90722 Acquired absence of ovaries, bilateral: Secondary | ICD-10-CM | POA: Diagnosis not present

## 2016-04-20 DIAGNOSIS — R29898 Other symptoms and signs involving the musculoskeletal system: Secondary | ICD-10-CM

## 2016-04-20 DIAGNOSIS — B379 Candidiasis, unspecified: Secondary | ICD-10-CM | POA: Diagnosis not present

## 2016-04-20 DIAGNOSIS — A419 Sepsis, unspecified organism: Secondary | ICD-10-CM | POA: Diagnosis present

## 2016-04-20 DIAGNOSIS — N390 Urinary tract infection, site not specified: Secondary | ICD-10-CM | POA: Diagnosis not present

## 2016-04-20 DIAGNOSIS — I10 Essential (primary) hypertension: Secondary | ICD-10-CM | POA: Diagnosis not present

## 2016-04-20 DIAGNOSIS — R339 Retention of urine, unspecified: Secondary | ICD-10-CM | POA: Diagnosis not present

## 2016-04-20 DIAGNOSIS — M549 Dorsalgia, unspecified: Secondary | ICD-10-CM

## 2016-04-20 DIAGNOSIS — R319 Hematuria, unspecified: Secondary | ICD-10-CM | POA: Diagnosis present

## 2016-04-20 DIAGNOSIS — Z87440 Personal history of urinary (tract) infections: Secondary | ICD-10-CM

## 2016-04-20 DIAGNOSIS — G8250 Quadriplegia, unspecified: Secondary | ICD-10-CM | POA: Diagnosis present

## 2016-04-20 DIAGNOSIS — E860 Dehydration: Secondary | ICD-10-CM | POA: Diagnosis present

## 2016-04-20 DIAGNOSIS — Z79899 Other long term (current) drug therapy: Secondary | ICD-10-CM | POA: Diagnosis not present

## 2016-04-20 DIAGNOSIS — G35 Multiple sclerosis: Secondary | ICD-10-CM | POA: Diagnosis present

## 2016-04-20 DIAGNOSIS — B9562 Methicillin resistant Staphylococcus aureus infection as the cause of diseases classified elsewhere: Secondary | ICD-10-CM | POA: Diagnosis present

## 2016-04-20 DIAGNOSIS — N319 Neuromuscular dysfunction of bladder, unspecified: Secondary | ICD-10-CM | POA: Diagnosis present

## 2016-04-20 DIAGNOSIS — R531 Weakness: Secondary | ICD-10-CM

## 2016-04-20 DIAGNOSIS — G4459 Other complicated headache syndrome: Secondary | ICD-10-CM | POA: Diagnosis not present

## 2016-04-20 DIAGNOSIS — Z23 Encounter for immunization: Secondary | ICD-10-CM

## 2016-04-20 DIAGNOSIS — N1330 Unspecified hydronephrosis: Secondary | ICD-10-CM | POA: Diagnosis not present

## 2016-04-20 DIAGNOSIS — N12 Tubulo-interstitial nephritis, not specified as acute or chronic: Secondary | ICD-10-CM

## 2016-04-20 DIAGNOSIS — R112 Nausea with vomiting, unspecified: Secondary | ICD-10-CM | POA: Diagnosis present

## 2016-04-20 DIAGNOSIS — D62 Acute posthemorrhagic anemia: Secondary | ICD-10-CM | POA: Diagnosis not present

## 2016-04-20 DIAGNOSIS — M7989 Other specified soft tissue disorders: Secondary | ICD-10-CM | POA: Diagnosis not present

## 2016-04-20 LAB — URINALYSIS, ROUTINE W REFLEX MICROSCOPIC
Bilirubin Urine: NEGATIVE
Glucose, UA: NEGATIVE mg/dL
Ketones, ur: 5 mg/dL — AB
Nitrite: NEGATIVE
Protein, ur: 100 mg/dL — AB
Specific Gravity, Urine: 1.013 (ref 1.005–1.030)
pH: 5 (ref 5.0–8.0)

## 2016-04-20 LAB — CBC
HCT: 40.2 % (ref 36.0–46.0)
Hemoglobin: 13.5 g/dL (ref 12.0–15.0)
MCH: 28.8 pg (ref 26.0–34.0)
MCHC: 33.6 g/dL (ref 30.0–36.0)
MCV: 85.9 fL (ref 78.0–100.0)
Platelets: 695 10*3/uL — ABNORMAL HIGH (ref 150–400)
RBC: 4.68 MIL/uL (ref 3.87–5.11)
RDW: 15.4 % (ref 11.5–15.5)
WBC: 21.3 10*3/uL — ABNORMAL HIGH (ref 4.0–10.5)

## 2016-04-20 LAB — COMPREHENSIVE METABOLIC PANEL
ALT: 9 U/L — ABNORMAL LOW (ref 14–54)
AST: 13 U/L — ABNORMAL LOW (ref 15–41)
Albumin: 3.8 g/dL (ref 3.5–5.0)
Alkaline Phosphatase: 110 U/L (ref 38–126)
Anion gap: 17 — ABNORMAL HIGH (ref 5–15)
BUN: 39 mg/dL — ABNORMAL HIGH (ref 6–20)
CO2: 15 mmol/L — ABNORMAL LOW (ref 22–32)
Calcium: 10.3 mg/dL (ref 8.9–10.3)
Chloride: 108 mmol/L (ref 101–111)
Creatinine, Ser: 2.12 mg/dL — ABNORMAL HIGH (ref 0.44–1.00)
GFR calc Af Amer: 30 mL/min — ABNORMAL LOW (ref >60)
GFR calc non Af Amer: 25 mL/min — ABNORMAL LOW (ref >60)
Glucose, Bld: 137 mg/dL — ABNORMAL HIGH (ref 65–99)
Potassium: 3.4 mmol/L — ABNORMAL LOW (ref 3.5–5.1)
Sodium: 140 mmol/L (ref 135–145)
Total Bilirubin: 0.6 mg/dL (ref 0.3–1.2)
Total Protein: 8.9 g/dL — ABNORMAL HIGH (ref 6.5–8.1)

## 2016-04-20 LAB — I-STAT CG4 LACTIC ACID, ED
Lactic Acid, Venous: 2.28 mmol/L (ref 0.5–1.9)
Lactic Acid, Venous: 1.99 mmol/L (ref 0.5–1.9)

## 2016-04-20 MED ORDER — SODIUM CHLORIDE 0.9 % IV BOLUS (SEPSIS)
1000.0000 mL | Freq: Once | INTRAVENOUS | Status: AC
  Administered 2016-04-20: 1000 mL via INTRAVENOUS

## 2016-04-20 MED ORDER — ACETAMINOPHEN 500 MG PO TABS
1000.0000 mg | ORAL_TABLET | Freq: Once | ORAL | Status: AC
  Administered 2016-04-20: 1000 mg via ORAL
  Filled 2016-04-20: qty 2

## 2016-04-20 MED ORDER — CEFTRIAXONE SODIUM 1 G IJ SOLR
1.0000 g | Freq: Once | INTRAMUSCULAR | Status: AC
  Administered 2016-04-20: 1 g via INTRAVENOUS
  Filled 2016-04-20: qty 10

## 2016-04-20 MED ORDER — SODIUM CHLORIDE 0.9 % IV SOLN
INTRAVENOUS | Status: DC
  Administered 2016-04-20 – 2016-04-21 (×3): via INTRAVENOUS

## 2016-04-20 NOTE — ED Notes (Signed)
Patient transported to CT 

## 2016-04-20 NOTE — ED Notes (Signed)
Lab called with lactic acid result of 2.28

## 2016-04-20 NOTE — ED Triage Notes (Signed)
Pt presents with c/o weakness and nausea. The symptoms began earlier this week and have been getting worse since onset. Today she tried to use her walker to get to the bathroom and she fell. She just completed a course of antibiotics for UTI on Wednesday, husband was thinking that maybe that was causing her nausea but it has not improved since Wednesday and now she is not able to tolerate any food or drink. She reports generalized body aches and abd pain. She denies urinary complaints. She has MS.

## 2016-04-20 NOTE — ED Provider Notes (Signed)
[facility]-EMERGENCY DEPT Provider Note   CSN: [id number] Arrival date & time: 04/20/16  1826     History   Chief Complaint Chief Complaint  Patient presents with  . Weakness    HPI Crystal Park is a 56 y.o. female.  The history is provided by the patient and a relative.  Dysuria   This is a recurrent problem. Episode onset: 2 weeks. The problem occurs every urination. The problem has not changed since onset.The quality of the pain is described as burning. There has been no fever. Associated symptoms include nausea, vomiting and flank pain. She has tried antibiotics (macrobid) for the symptoms. Her past medical history is significant for recurrent UTIs.    Past Medical History:  Diagnosis Date  . Benign paroxysmal positional vertigo 10/12/2014  . MS (multiple sclerosis) (HCC)    diagnosed 9/16    Patient Active Problem List   Diagnosis Date Noted  . Sepsis (HCC) 04/21/2016  . Sepsis secondary to UTI (HCC) 04/20/2016  . Attention deficit disorder 02/23/2016  . Gait disturbance 11/24/2015  . Other fatigue 11/24/2015  . Urinary urgency 11/24/2015  . Dehydration   . UTI (lower urinary tract infection) 10/29/2015  . Leukocytosis 10/29/2015  . Nausea without vomiting 10/29/2015  . MS (multiple sclerosis) (HCC) 10/26/2014    Past Surgical History:  Procedure Laterality Date  . OOPHORECTOMY Left    2013    OB History    No data available       Home Medications    Prior to Admission medications   Medication Sig Start Date End Date Taking? Authorizing Provider  diazepam (VALIUM) 5 MG tablet Take 1 tablet (5 mg total) by mouth every 8 (eight) hours as needed for anxiety. Patient taking differently: Take 5 mg by mouth every 12 (twelve) hours as needed for anxiety (for dizziness also).  02/23/16  Yes Asa Lente, MD  TECFIDERA 240 MG CPDR TAKE 1 CAPSULE BY MOUTH TWICE DAILY 10/28/15  Yes Asa Lente, MD  amphetamine-dextroamphetamine (ADDERALL XR) 15 MG 24 hr  capsule Take 1 capsule by mouth every morning. Patient not taking: Reported on 04/20/2016 03/28/16   Asa Lente, MD  cholecalciferol (VITAMIN D) 1000 UNITS tablet Take 1,000 Units by mouth daily. otc vit. d 5,000iu daily/fim    Historical Provider, MD  CRANBERRY PO Take 1 capsule by mouth daily.    Historical Provider, MD  dimenhyDRINATE (DRAMAMINE) 50 MG tablet Take 50 mg by mouth every 8 (eight) hours as needed for dizziness.    Historical Provider, MD  nitrofurantoin (MACRODANTIN) 100 MG capsule Take 100 mg by mouth 2 (two) times daily. 04/11/16   Historical Provider, MD  oxybutynin (DITROPAN-XL) 10 MG 24 hr tablet Take 1 tablet (10 mg total) by mouth at bedtime. Patient not taking: Reported on 04/20/2016 03/07/16   Asa Lente, MD    Family History History reviewed. No pertinent family history.  Social History Social History  Substance Use Topics  . Smoking status: Never Smoker  . Smokeless tobacco: Never Used  . Alcohol use No     Allergies   Patient has no known allergies.   Review of Systems Review of Systems  Gastrointestinal: Positive for nausea and vomiting.  Genitourinary: Positive for dysuria and flank pain.  Ten systems are reviewed and are negative for acute change except as noted in the HPI    Physical Exam Updated Vital Signs BP 149/100 (BP Location: Right Arm)   Pulse (!) 125  Temp 97.4 F (36.3 C) (Oral)   Resp 16   LMP 10/25/2015   SpO2 100%   Physical Exam  Constitutional: She is oriented to person, place, and time. She appears well-developed and well-nourished. She has a sickly appearance. She does not appear ill. No distress.  HENT:  Head: Normocephalic and atraumatic.  Nose: Nose normal.  Mouth/Throat: Mucous membranes are dry.  Eyes: Conjunctivae and EOM are normal. Pupils are equal, round, and reactive to light. Right eye exhibits no discharge. Left eye exhibits no discharge. No scleral icterus.  Neck: Normal range of motion. Neck supple.   Cardiovascular: Normal rate and regular rhythm.  Exam reveals no gallop and no friction rub.   No murmur heard. Pulmonary/Chest: Effort normal and breath sounds normal. No stridor. No respiratory distress. She has no rales.  Abdominal: Soft. She exhibits no distension. There is tenderness in the suprapubic area. There is CVA tenderness. There is no rigidity, no rebound and no guarding.  Musculoskeletal: She exhibits no edema or tenderness.  Neurological: She is alert and oriented to person, place, and time.  Skin: Skin is warm and dry. No rash noted. She is not diaphoretic. No erythema.  Psychiatric: She has a normal mood and affect.  Vitals reviewed.    ED Treatments / Results  Labs (all labs ordered are listed, but only abnormal results are displayed) Labs Reviewed  COMPREHENSIVE METABOLIC PANEL - Abnormal; Notable for the following:       Result Value   Potassium 3.4 (*)    CO2 15 (*)    Glucose, Bld 137 (*)    BUN 39 (*)    Creatinine, Ser 2.12 (*)    Total Protein 8.9 (*)    AST 13 (*)    ALT 9 (*)    GFR calc non Af Amer 25 (*)    GFR calc Af Amer 30 (*)    Anion gap 17 (*)    All other components within normal limits  CBC - Abnormal; Notable for the following:    WBC 21.3 (*)    Platelets 695 (*)    All other components within normal limits  URINALYSIS, ROUTINE W REFLEX MICROSCOPIC - Abnormal; Notable for the following:    Color, Urine STRAW (*)    APPearance TURBID (*)    Hgb urine dipstick LARGE (*)    Ketones, ur 5 (*)    Protein, ur 100 (*)    Leukocytes, UA SMALL (*)    Bacteria, UA MANY (*)    Squamous Epithelial / LPF TOO NUMEROUS TO COUNT (*)    All other components within normal limits  I-STAT CG4 LACTIC ACID, ED - Abnormal; Notable for the following:    Lactic Acid, Venous 2.28 (*)    All other components within normal limits  I-STAT CG4 LACTIC ACID, ED - Abnormal; Notable for the following:    Lactic Acid, Venous 1.99 (*)    All other components  within normal limits  CULTURE, BLOOD (ROUTINE X 2)  CULTURE, BLOOD (ROUTINE X 2)  URINE CULTURE  LACTIC ACID, PLASMA  LACTIC ACID, PLASMA  CBC  BASIC METABOLIC PANEL  HIV ANTIBODY (ROUTINE TESTING)    EKG  EKG Interpretation None       Radiology Ct Abdomen Pelvis Wo Contrast  Result Date: 04/20/2016 CLINICAL DATA:  Weakness and nausea beginning earlier this week and worsening. Patient fell today. Nausea, body aches, and abdominal pain. History of multiple sclerosis. EXAM: CT ABDOMEN AND PELVIS WITHOUT CONTRAST  TECHNIQUE: Multidetector CT imaging of the abdomen and pelvis was performed following the standard protocol without IV contrast. COMPARISON:  None. FINDINGS: Lower chest: Lung bases are clear.  Small esophageal hiatal hernia. Hepatobiliary: No focal liver abnormality is seen. No gallstones, gallbladder wall thickening, or biliary dilatation. Pancreas: Unremarkable. No pancreatic ductal dilatation or surrounding inflammatory changes. Spleen: Normal in size without focal abnormality. Adrenals/Urinary Tract: No adrenal gland nodules. Kidneys are symmetrical. There is bilateral hydronephrosis and hydroureter. No stones or obstructing lesions demonstrated. Changes may be due to reflux or pyelonephritis. Bladder wall is diffusely thickened which may indicate cystitis. Moderate distention of the bladder. Asymmetric nodular thickening at the dome of the bladder with surrounding infiltration could represent a bladder diverticulum, hemorrhage, or eccentric lesion. Stomach/Bowel: Stomach, small bowel, and colon are mostly decompressed. No inflammatory wall thickening is appreciated although under distention may obscure inflammatory changes. Appendix is normal. Vascular/Lymphatic: No significant vascular findings are present. No enlarged abdominal or pelvic lymph nodes. Reproductive: Uterus is anteverted with nodular enlargement suggesting fibroids. Ovaries are not enlarged. Other: No free air or free  fluid in the abdomen. Abdominal wall musculature appears intact. Musculoskeletal: In the uppermost images, at the T10-T11 interspace level, there is increased density material causing anterior effacement of the thecal sac and displacement of the thoracic cord. Material is contiguous with the disc space. This is incompletely included within the field of view. This could represent calcified herniated disc material or could represent extruded cement from kyphoplasty if the patient has had this procedure done. No prior comparison studies of this area are available to indicate wound this represents acute or chronic process. Consider MRI for further evaluation of the thoracic spine. IMPRESSION: Bilateral hydronephrosis and hydroureter to the bladder. No obstructing stone or lesion identified. This could represent reflux or pyelonephritis. Diffuse thickening of the bladder wall suggesting cystitis. Asymmetric nodular thickening of the dome of the bladder with surrounding infiltration possibly representing bladder diverticulum, hemorrhage, or eccentric lesion. No evidence of bowel obstruction or inflammation. High density anterior extradural defect at T10-11, incompletely included. This could represent calcified herniated disc material or could represent extruded cement from kyphoplasty. Consider MRI for further evaluation if clinically indicated. Electronically Signed   By: Burman Nieves M.D.   On: 04/20/2016 23:45   Dg Chest Port 1 View  Result Date: 04/20/2016 CLINICAL DATA:  Tachycardia, weakness and nausea EXAM: PORTABLE CHEST 1 VIEW COMPARISON:  Chest radiograph 10/29/2015 FINDINGS: The lungs are well inflated. Cardiomediastinal contours are normal. No pneumothorax or pleural effusion. No focal airspace consolidation or pulmonary edema. IMPRESSION: Clear lungs. Electronically Signed   By: Deatra Robinson M.D.   On: 04/20/2016 21:45    Procedures Procedures (including critical care time)  Medications Ordered  in ED Medications  0.9 %  sodium chloride infusion ( Intravenous New Bag/Given 04/20/16 2255)  Influenza vac split quadrivalent PF (FLUARIX) injection 0.5 mL (not administered)  diazepam (VALIUM) tablet 5 mg (not administered)  ondansetron (ZOFRAN) tablet 4 mg (not administered)    Or  ondansetron (ZOFRAN) injection 4 mg (not administered)  acetaminophen (TYLENOL) tablet 650 mg (not administered)    Or  acetaminophen (TYLENOL) suppository 650 mg (not administered)  albuterol (PROVENTIL) (2.5 MG/3ML) 0.083% nebulizer solution 2.5 mg (not administered)  feeding supplement (ENSURE ENLIVE) (ENSURE ENLIVE) liquid 237 mL (not administered)  cefTRIAXone (ROCEPHIN) 1 g in dextrose 5 % 50 mL IVPB (not administered)  HYDROcodone-acetaminophen (NORCO/VICODIN) 5-325 MG per tablet 1 tablet (1 tablet Oral Given 04/21/16 0134)  morphine 2 MG/ML injection 2 mg (not administered)  MEDLINE mouth rinse (not administered)  cefTRIAXone (ROCEPHIN) 1 g in dextrose 5 % 50 mL IVPB (0 g Intravenous Stopped 04/20/16 2133)  sodium chloride 0.9 % bolus 1,000 mL (0 mLs Intravenous Stopped 04/20/16 2235)  acetaminophen (TYLENOL) tablet 1,000 mg (1,000 mg Oral Given 04/20/16 2211)  sodium chloride 0.9 % bolus 1,000 mL (0 mLs Intravenous Stopped 04/20/16 2235)  sodium chloride 0.9 % bolus 1,000 mL (0 mLs Intravenous Stopped 04/20/16 2253)  potassium chloride SA (K-DUR,KLOR-CON) CR tablet 20 mEq (20 mEq Oral Given 04/21/16 0134)     Initial Impression / Assessment and Plan / ED Course  I have reviewed the triage vital signs and the nursing notes.  Pertinent labs & imaging results that were available during my care of the patient were reviewed by me and considered in my medical decision making (see chart for details).  Clinical Course as of Apr 21 206  Fri Apr 20, 2016  2100 Concerning for sepsis secondary to urinary source given her recent h/o UTI with suprapubic and flank pain. If UA is negative will obtain abd/pelv CT. No  other sources identified on history or exam. Triage labs with leukocytosis and elevated lactic acid. Code sepsis initiated. Does not meet septic shock criteria and does not require 30cc/kg IVF. 1L bolus and rocephin ordered.  [PC]  2215 UA confirming infection  [PC]  2228 Sepsis - Repeat Assessment  Performed at:    10:30 PM   Vitals  Vitals:  04/20/16 2115 04/20/16 2119 04/20/16 2145 04/20/16 2200 BP: 168/97  163/82 160/95 Pulse:  105 107 107 Resp: 15 16 18 16  Temp:     TempSrc:     SpO2: 100% 100% 100% 100%       Heart:     Tachycardic  Lungs:    CTA  Capillary Refill:   <2 sec  Peripheral Pulse:   Radial pulse palpable  Skin:     Dry   [PC]    Clinical Course User Index [PC] Nira Conn, MD    Admitted to hospitalist.   Final Clinical Impressions(s) / ED Diagnoses   Final diagnoses:  Sepsis, due to unspecified organism [facility])  Pyelonephritis      Nira Conn, MD 04/21/16 0210

## 2016-04-20 NOTE — Progress Notes (Signed)
Received report from ED.  

## 2016-04-20 NOTE — ED Notes (Signed)
Code Sepsis activated @ 2043

## 2016-04-21 ENCOUNTER — Inpatient Hospital Stay (HOSPITAL_COMMUNITY): Payer: Managed Care, Other (non HMO)

## 2016-04-21 DIAGNOSIS — R112 Nausea with vomiting, unspecified: Secondary | ICD-10-CM

## 2016-04-21 DIAGNOSIS — A419 Sepsis, unspecified organism: Secondary | ICD-10-CM | POA: Insufficient documentation

## 2016-04-21 DIAGNOSIS — N1330 Unspecified hydronephrosis: Secondary | ICD-10-CM

## 2016-04-21 DIAGNOSIS — G35 Multiple sclerosis: Secondary | ICD-10-CM

## 2016-04-21 DIAGNOSIS — N179 Acute kidney failure, unspecified: Secondary | ICD-10-CM | POA: Diagnosis present

## 2016-04-21 DIAGNOSIS — G8250 Quadriplegia, unspecified: Secondary | ICD-10-CM

## 2016-04-21 LAB — BASIC METABOLIC PANEL
Anion gap: 13 (ref 5–15)
BUN: 30 mg/dL — ABNORMAL HIGH (ref 6–20)
CO2: 14 mmol/L — ABNORMAL LOW (ref 22–32)
Calcium: 8.3 mg/dL — ABNORMAL LOW (ref 8.9–10.3)
Chloride: 116 mmol/L — ABNORMAL HIGH (ref 101–111)
Creatinine, Ser: 1.43 mg/dL — ABNORMAL HIGH (ref 0.44–1.00)
GFR calc Af Amer: 47 mL/min — ABNORMAL LOW (ref >60)
GFR calc non Af Amer: 41 mL/min — ABNORMAL LOW (ref >60)
Glucose, Bld: 112 mg/dL — ABNORMAL HIGH (ref 65–99)
Potassium: 3.2 mmol/L — ABNORMAL LOW (ref 3.5–5.1)
Sodium: 143 mmol/L (ref 135–145)

## 2016-04-21 LAB — HIV ANTIBODY (ROUTINE TESTING W REFLEX): HIV Screen 4th Generation wRfx: NONREACTIVE

## 2016-04-21 LAB — CBC
HCT: 33.5 % — ABNORMAL LOW (ref 36.0–46.0)
Hemoglobin: 10.8 g/dL — ABNORMAL LOW (ref 12.0–15.0)
MCH: 27.8 pg (ref 26.0–34.0)
MCHC: 32.2 g/dL (ref 30.0–36.0)
MCV: 86.1 fL (ref 78.0–100.0)
Platelets: 547 10*3/uL — ABNORMAL HIGH (ref 150–400)
RBC: 3.89 MIL/uL (ref 3.87–5.11)
RDW: 15.1 % (ref 11.5–15.5)
WBC: 16.1 10*3/uL — ABNORMAL HIGH (ref 4.0–10.5)

## 2016-04-21 LAB — LACTIC ACID, PLASMA
Lactic Acid, Venous: 1 mmol/L (ref 0.5–1.9)
Lactic Acid, Venous: 0.8 mmol/L (ref 0.5–1.9)

## 2016-04-21 MED ORDER — MORPHINE SULFATE (PF) 2 MG/ML IV SOLN
2.0000 mg | INTRAVENOUS | Status: DC | PRN
  Administered 2016-04-22: 2 mg via INTRAVENOUS
  Filled 2016-04-21: qty 1

## 2016-04-21 MED ORDER — ACETAMINOPHEN 650 MG RE SUPP: 650.0000 mg | Freq: Four times a day (QID) | RECTAL | Status: DC | PRN

## 2016-04-21 MED ORDER — ALBUTEROL SULFATE (2.5 MG/3ML) 0.083% IN NEBU: 2.5000 mg | INHALATION_SOLUTION | RESPIRATORY_TRACT | Status: DC | PRN

## 2016-04-21 MED ORDER — ONDANSETRON HCL 4 MG/2ML IJ SOLN: 4.0000 mg | Freq: Four times a day (QID) | INTRAMUSCULAR | Status: DC | PRN

## 2016-04-21 MED ORDER — ONDANSETRON HCL 4 MG PO TABS: 4.0000 mg | ORAL_TABLET | Freq: Four times a day (QID) | ORAL | Status: DC | PRN

## 2016-04-21 MED ORDER — ORAL CARE MOUTH RINSE
15.0000 mL | Freq: Two times a day (BID) | OROMUCOSAL | Status: DC
  Administered 2016-04-21 – 2016-04-24 (×5): 15 mL via OROMUCOSAL

## 2016-04-21 MED ORDER — DIMETHYL FUMARATE 240 MG PO CPDR
1.0000 | DELAYED_RELEASE_CAPSULE | Freq: Two times a day (BID) | ORAL | Status: DC
  Administered 2016-04-21 – 2016-04-24 (×6): 240 mg via ORAL
  Filled 2016-04-21 (×7): qty 1

## 2016-04-21 MED ORDER — ACETAMINOPHEN 325 MG PO TABS: 650.0000 mg | ORAL_TABLET | Freq: Four times a day (QID) | ORAL | Status: DC | PRN

## 2016-04-21 MED ORDER — HYDROCODONE-ACETAMINOPHEN 5-325 MG PO TABS
1.0000 | ORAL_TABLET | Freq: Four times a day (QID) | ORAL | Status: DC | PRN
  Administered 2016-04-21 – 2016-04-24 (×8): 1 via ORAL
  Filled 2016-04-21 (×8): qty 1

## 2016-04-21 MED ORDER — DIAZEPAM 5 MG PO TABS
5.0000 mg | ORAL_TABLET | Freq: Two times a day (BID) | ORAL | Status: DC | PRN
  Administered 2016-04-22: 5 mg via ORAL
  Filled 2016-04-21: qty 1

## 2016-04-21 MED ORDER — ENSURE ENLIVE PO LIQD
237.0000 mL | Freq: Two times a day (BID) | ORAL | Status: DC
  Administered 2016-04-21 – 2016-04-24 (×6): 237 mL via ORAL

## 2016-04-21 MED ORDER — DIMENHYDRINATE 50 MG PO TABS: 50.0000 mg | ORAL_TABLET | Freq: Three times a day (TID) | ORAL | Status: DC | PRN

## 2016-04-21 MED ORDER — INFLUENZA VAC SPLIT QUAD 0.5 ML IM SUSY
0.5000 mL | PREFILLED_SYRINGE | INTRAMUSCULAR | Status: AC
Start: 2016-04-22 — End: 2016-04-23
  Administered 2016-04-23: 0.5 mL via INTRAMUSCULAR
  Filled 2016-04-21: qty 0.5

## 2016-04-21 MED ORDER — POTASSIUM CHLORIDE CRYS ER 20 MEQ PO TBCR
40.0000 meq | EXTENDED_RELEASE_TABLET | ORAL | Status: AC
  Administered 2016-04-21: 40 meq via ORAL
  Filled 2016-04-21: qty 2

## 2016-04-21 MED ORDER — POTASSIUM CHLORIDE CRYS ER 20 MEQ PO TBCR
20.0000 meq | EXTENDED_RELEASE_TABLET | ORAL | Status: AC
  Administered 2016-04-21: 20 meq via ORAL
  Filled 2016-04-21: qty 1

## 2016-04-21 MED ORDER — CEFTRIAXONE SODIUM 1 G IJ SOLR
1.0000 g | INTRAMUSCULAR | Status: DC
  Administered 2016-04-21 – 2016-04-23 (×3): 1 g via INTRAVENOUS
  Filled 2016-04-21 (×3): qty 10

## 2016-04-21 NOTE — Progress Notes (Signed)
Inpatient Rehabilitation  Patient was screened by Fae Pippin for appropriateness for an Inpatient Acute Rehab consult.  At this time we are recommending an Inpatient Rehab consult and note that one has been ordered.  Please plan for an admissions coordinator to follow up Monday after the consult has been completed.    Charlane Ferretti., CCC/SLP Admission Coordinator  [facility]  Cell [phone]

## 2016-04-21 NOTE — Consult Note (Addendum)
Urology Consult  CC: Referring physician: Maretta Bees, MD Reason for referral: Bilateral hydronephrosis  Impression/Assessment: 1. Neurogenic bladder - she has a neurogenic bladder secondary to her multiple sclerosis. She is currently on oxybutynin but is continuing to have difficulty with incontinence. Further evaluation of her bladder is indicated and she has been scheduled for urodynamics which will be performed as an outpatient as there is no urodynamic equipment in the hospital.  2. Bilateral hydronephrosis with urosepsis - she has dilatation of her ureters on both sides down to the level of the bladder. This would suggest the possibility of reflux although I think what is more likely is some obstruction of the ureters at the level of the bladder due to bladder wall thickening which in part may be chronic due to her neurogenic bladder and possibly acutely worsened by bladder inflammation from a UTI. At this time with her having improved clinically with a decrease in her white blood cell count, normalization of her creatinine, no hypotension or fever and normalization of her lactic acid it does not appear she is in need of bilateral stent placement at this time. She will need to be monitored for continued improvement and I will make her nothing by mouth after midnight while continuing to monitor her condition in case there should be any worsening of her condition necessitating the need for stent placement. Otherwise with treatment of her infection she may likely have decreased dilatation of her ureters bilaterally and this can be followed up as an outpatient at the time of her scheduled renal ultrasound at our office.    Plan: 1. Continue intravenous antibiotics. 2. Await urine culture results. 3. She will be made nothing by mouth after midnight. 4. We will continue to follow and assist.    History of Present Illness:  HPI: Crystal Park is a 56 y.o. female with medical history  significant of multiple sclerosis; who presented to the ER with complaints of generalized weakness. At baseline the patient uses a walker to ambulate. She was noted to have just completed a course of Macrobid for urinary tract infection just 3 days ago which was prescribed by Dr. Mena Goes. Patient's husband was thinking the medication may have been causing her nausea symptoms. However, she had been unable to tolerate any food or drink and had generalized body aches and abdominal pain. Patient reports pain in abdomen is soreness feeling that his severe and radiates to her back. Patient reports urinary incontinence and decreased urged to feel the need to urinate that seems to worsen. Other associated symptoms include inability to ambulate as patient reported falling while trying to get to the bathroom. Denies having any significant shortness of breath, loss of consciousness, or chest pain. Review of records shows patient previously was admitted into the hospital with similar symptoms generalized weakness secondary to UTI back in 10/2015.  She was seen by Dr. Mena Goes last month in the office. At that time she reported difficulty with urinary incontinence and he ordered urodynamics which are scheduled. In addition he noted she had not had upper track evaluation in some time and ordered a renal ultrasound which was scheduled but had not been performed yet.   ED Course: Upon admission into the emergency department patient was seen to be afebrile, pulse 101-125, respirations 15-21, and all other vitals within normal limits.  Laboratory revealed WBC of 21.3, platelets 695, potassium 3.4, chloride 108, CO2 15, BUN 39, creatinine 2.12, lactic acid 2.28. Chest x-ray was clear. Urinalysis was  positive for signs of urinary tract infection. Sepsis protocol was initiated, she received full fluid bolus, and started on Rocephin.   Interval history: She underwent a CT scan which revealed bilateral hydronephrosis down to the  level of the bladder. There was good preservation of renal parenchyma. There were no stones within the ureter on either side or within the kidneys. The bladder wall did have some mild thickening but no intravesical lesions were noted. The scan also revealed what the radiologist described as an asymmetric nodular thickening at the dome of the bladder with surrounding inflammation suggesting a possible bladder diverticulum or other lesion however there appeared to possibly be some fluid and it was at a location that suggested the possibility of a urachal cyst. She has had previous cystoscopy without a diverticulum noted at the dome.   Since her admission she is remained afebrile. She has had some tachycardia but no hypotension. In addition her lactic acid which was elevated upon and admission has trended downward and is now normal. Her white blood cell count has improved. Urine and blood cultures were obtained and remain pending at this time.  Past Medical History:  Diagnosis Date  . Benign paroxysmal positional vertigo 10/12/2014  . MS (multiple sclerosis) (HCC)    diagnosed 9/16   Past Surgical History:  Procedure Laterality Date  . OOPHORECTOMY Left    2013    Medications:  Prior to Admission:  Prescriptions Prior to Admission  Medication Sig Dispense Refill Last Dose  . diazepam (VALIUM) 5 MG tablet Take 1 tablet (5 mg total) by mouth every 8 (eight) hours as needed for anxiety. (Patient taking differently: Take 5 mg by mouth every 12 (twelve) hours as needed for anxiety (for dizziness also). ) 90 tablet 3 04/20/2016 at Unknown time  . TECFIDERA 240 MG CPDR TAKE 1 CAPSULE BY MOUTH TWICE DAILY 60 capsule 10 04/20/2016 at 1100  . amphetamine-dextroamphetamine (ADDERALL XR) 15 MG 24 hr capsule Take 1 capsule by mouth every morning. (Patient not taking: Reported on 04/20/2016) 30 capsule 0 Not Taking at Unknown time  . cholecalciferol (VITAMIN D) 1000 UNITS tablet Take 1,000 Units by mouth daily. otc  vit. d 5,000iu daily/fim   Not Taking at Unknown time  . CRANBERRY PO Take 1 capsule by mouth daily.   Not Taking at Unknown time  . dimenhyDRINATE (DRAMAMINE) 50 MG tablet Take 50 mg by mouth every 8 (eight) hours as needed for dizziness.   Not Taking at Unknown time  . nitrofurantoin (MACRODANTIN) 100 MG capsule Take 100 mg by mouth 2 (two) times daily.   Completed Course at Unknown time  . oxybutynin (DITROPAN-XL) 10 MG 24 hr tablet Take 1 tablet (10 mg total) by mouth at bedtime. (Patient not taking: Reported on 04/20/2016) 90 tablet 3 Not Taking at Unknown time   Scheduled: . cefTRIAXone (ROCEPHIN)  IV  1 g Intravenous Q24H  . Dimethyl Fumarate  1 capsule Oral BID  . feeding supplement (ENSURE ENLIVE)  237 mL Oral BID BM  . [START ON 04/22/2016] Influenza vac split quadrivalent PF  0.5 mL Intramuscular Tomorrow-1000  . mouth rinse  15 mL Mouth Rinse BID   Continuous: . sodium chloride 100 mL/hr at 04/21/16 1610    Allergies: No Known Allergies  History reviewed. No pertinent family history.  Social History:  reports that she has never smoked. She has never used smokeless tobacco. She reports that she does not drink alcohol or use drugs.  Review of Systems (10 point):  Pertinent items are noted in HPI. A comprehensive review of systems was negative except as noted above.  Physical Exam:  Vital signs in last 24 hours: Temp:  [97.4 F (36.3 C)-98.3 F (36.8 C)] 97.9 F (36.6 C) (02/17 0622) Pulse Rate:  [101-125] 106 (02/17 0622) Resp:  [15-21] 18 (02/17 0622) BP: (139-168)/(69-100) 139/77 (02/17 0622) SpO2:  [98 %-100 %] 100 % (02/17 0622) Weight:  [119 lb 4.3 oz (54.1 kg)] 119 lb 4.3 oz (54.1 kg) (02/17 0039) General appearance: alert but somnolent appearing stated age Head: Normocephalic, without obvious abnormality, atraumatic Eyes: conjunctivae/corneas clear. EOM's intact.  Oropharynx: dry mucous membranes Neck: supple, symmetrical, trachea midline Resp: normal  respiratory effort Cardio: regular rate and rhythm Back: symmetric, no curvature. ROM normal. No CVA tenderness. GI: soft mild, diffuse tenderness without peritoneal signs,bowel sounds normal; no masses,  no organomegaly Extremities: extremities normal, atraumatic, no cyanosis or edema Skin: Skin color normal. No visible rashes or lesions   Laboratory Data:   Recent Labs  04/20/16 1845 04/21/16 0217  WBC 21.3* 16.1*  HGB 13.5 10.8*  HCT 40.2 33.5*   BMET  Recent Labs  04/20/16 1845 04/21/16 0217  NA 140 143  K 3.4* 3.2*  CL 108 116*  CO2 15* 14*  GLUCOSE 137* 112*  BUN 39* 30*  CREATININE 2.12* 1.43*  CALCIUM 10.3 8.3*   No results for input(s): LABPT, INR in the last 72 hours. No results for input(s): LABURIN in the last 72 hours. Results for orders placed or performed during the hospital encounter of 04/20/16  Blood Culture (routine x 2)     Status: None (Preliminary result)   Collection Time: 04/20/16  8:50 PM  Result Value Ref Range Status   Specimen Description BLOOD RIGHT ANTECUBITAL  Final   Special Requests BOTTLES DRAWN AEROBIC AND ANAEROBIC 5CC  Final   Culture NO GROWTH < 24 HOURS  Final   Report Status PENDING  Incomplete  Blood Culture (routine x 2)     Status: None (Preliminary result)   Collection Time: 04/20/16  8:55 PM  Result Value Ref Range Status   Specimen Description BLOOD LEFT ANTECUBITAL  Final   Special Requests AEROBIC BOTTLE ONLY 5CC  Final   Culture NO GROWTH < 24 HOURS  Final   Report Status PENDING  Incomplete   Creatinine:  Recent Labs  04/20/16 1845 04/21/16 0217  CREATININE 2.12* 1.43*    Imaging: Ct Abdomen Pelvis Wo Contrast  Result Date: 04/20/2016 CLINICAL DATA:  Weakness and nausea beginning earlier this week and worsening. Patient fell today. Nausea, body aches, and abdominal pain. History of multiple sclerosis. EXAM: CT ABDOMEN AND PELVIS WITHOUT CONTRAST TECHNIQUE: Multidetector CT imaging of the abdomen and  pelvis was performed following the standard protocol without IV contrast. COMPARISON:  None. FINDINGS: Lower chest: Lung bases are clear.  Small esophageal hiatal hernia. Hepatobiliary: No focal liver abnormality is seen. No gallstones, gallbladder wall thickening, or biliary dilatation. Pancreas: Unremarkable. No pancreatic ductal dilatation or surrounding inflammatory changes. Spleen: Normal in size without focal abnormality. Adrenals/Urinary Tract: No adrenal gland nodules. Kidneys are symmetrical. There is bilateral hydronephrosis and hydroureter. No stones or obstructing lesions demonstrated. Changes may be due to reflux or pyelonephritis. Bladder wall is diffusely thickened which may indicate cystitis. Moderate distention of the bladder. Asymmetric nodular thickening at the dome of the bladder with surrounding infiltration could represent a bladder diverticulum, hemorrhage, or eccentric lesion. Stomach/Bowel: Stomach, small bowel, and colon are mostly decompressed. No inflammatory wall  thickening is appreciated although under distention may obscure inflammatory changes. Appendix is normal. Vascular/Lymphatic: No significant vascular findings are present. No enlarged abdominal or pelvic lymph nodes. Reproductive: Uterus is anteverted with nodular enlargement suggesting fibroids. Ovaries are not enlarged. Other: No free air or free fluid in the abdomen. Abdominal wall musculature appears intact. Musculoskeletal: In the uppermost images, at the T10-T11 interspace level, there is increased density material causing anterior effacement of the thecal sac and displacement of the thoracic cord. Material is contiguous with the disc space. This is incompletely included within the field of view. This could represent calcified herniated disc material or could represent extruded cement from kyphoplasty if the patient has had this procedure done. No prior comparison studies of this area are available to indicate wound this  represents acute or chronic process. Consider MRI for further evaluation of the thoracic spine. IMPRESSION: Bilateral hydronephrosis and hydroureter to the bladder. No obstructing stone or lesion identified. This could represent reflux or pyelonephritis. Diffuse thickening of the bladder wall suggesting cystitis. Asymmetric nodular thickening of the dome of the bladder with surrounding infiltration possibly representing bladder diverticulum, hemorrhage, or eccentric lesion. No evidence of bowel obstruction or inflammation. High density anterior extradural defect at T10-11, incompletely included. This could represent calcified herniated disc material or could represent extruded cement from kyphoplasty. Consider MRI for further evaluation if clinically indicated. Electronically Signed   By: Burman Nieves M.D.   On: 04/20/2016 23:45   Dg Chest Port 1 View  Result Date: 04/20/2016 CLINICAL DATA:  Tachycardia, weakness and nausea EXAM: PORTABLE CHEST 1 VIEW COMPARISON:  Chest radiograph 10/29/2015 FINDINGS: The lungs are well inflated. Cardiomediastinal contours are normal. No pneumothorax or pleural effusion. No focal airspace consolidation or pulmonary edema. IMPRESSION: Clear lungs. Electronically Signed   By: Deatra Robinson M.D.   On: 04/20/2016 21:45   CT scan images were independently reviewed as noted above.    Tiyanna Larcom C 04/21/2016, 1:16 PM

## 2016-04-21 NOTE — Progress Notes (Signed)
Initial Nutrition Assessment  DOCUMENTATION CODES:   Not applicable  INTERVENTION:   Ensure Enlive po BID, each supplement provides 350 kcal and 20 grams of protein  NUTRITION DIAGNOSIS:   Unintentional weight loss related to acute illness, chronic illness as evidenced by percent weight loss (8.5% weight loss in 2 months).  GOAL:   Patient will meet greater than or equal to 90% of their needs  MONITOR:   PO intake, Supplement acceptance, Labs, I & O's  REASON FOR ASSESSMENT:   Malnutrition Screening Tool    ASSESSMENT:   56 year old female with history of multiple sclerosis on Tecfidera, recent history of UTI, who presented to the hospital on 2/16 with several days history of lower abdominal pain, and generalized weakness.   Unable to complete Nutrition-Focused physical exam at this time.  Patient with 8.5% weight loss within the past 2 months and had declined to the point of not being able to walk PTA. Suspect muscle depletion. Labs reviewed: potassium 3.2 Medications reviewed.  Diet Order:  Diet Heart Room service appropriate? Yes; Fluid consistency: Thin Diet NPO time specified  Skin:  Reviewed, no issues  Last BM:  2/12  Height:   Ht Readings from Last 1 Encounters:  04/21/16 5\' 5"  (1.651 m)    Weight:   Wt Readings from Last 1 Encounters:  04/21/16 119 lb 4.3 oz (54.1 kg)    Ideal Body Weight:  56.8 kg  BMI:  Body mass index is 19.85 kg/m.  Estimated Nutritional Needs:   Kcal:  1500-1700  Protein:  70-80 gm  Fluid:  1.5-1.7 L  EDUCATION NEEDS:   No education needs identified at this time  Joaquin Courts, RD, LDN, CNSC Pager [phone] After Hours Pager [phone]

## 2016-04-21 NOTE — H&P (Addendum)
History and Physical    Crystal Park YWV:[id number] DOB: [birth year] DOA: 04/20/2016  Referring MD/NP/PA: Leonette Monarch PCP: Melinda Crutch, MD  Patient coming from: Home  Chief Complaint: Generalized weakness  HPI: Crystal Park is a 56 y.o. female with medical history significant of multiple sclerosis; who presented with complaints of generalized weakness. At baseline the patient uses a walker to ambulate. She was noted to have just completed a course of Macrobid for urinary tract infection just 3 days ago. Antibiotics were reportedly started by the patient's urologist, but she is unsure of their name at this time. Patient's husband was thinking the medication may have been causing her nausea symptoms. However, she had been unable to tolerate any food or drink and had generalized body aches and abdominal pain. Patient reports pain in abdomen is soreness feeling that his severe and radiates to her back. Patient reports urinary incontinence and decreased urged to feel the need to urinate that seems to worsen. Other associated symptoms include inability to ambulate as patient reported falling while trying to get to the bathroom. Denies having any significant shortness of breath, loss of consciousness, or chest pain. Review of records shows patient previously was admitted into the hospital with similar symptoms generalized weakness secondary to UTI back in 10/2015.   ED Course: Upon admission into the emergency department patient was seen to be afebrile, pulse 101-125, respirations 15-21, and all other vitals within normal limits.  Laboratory revealed WBC of 21.3, platelets 695, potassium 3.4, chloride 108, CO2 15, BUN 39, creatinine 2.12, lactic acid 2.28. Chest x-ray was clear. Urinalysis was positive for signs of urinary tract infection. Sepsis protocol was initiated, she received full fluid bolus, and   started on Rocephin. TRH called to admit. Asked for CT scan of the abdomen without contrast to rule  out possible obstruction.   Review of Systems: As per HPI otherwise 10 point review of systems negative.   Past Medical History:  Diagnosis Date  . Benign paroxysmal positional vertigo 10/12/2014  . MS (multiple sclerosis) ([place])    diagnosed 9/16    Past Surgical History:  Procedure Laterality Date  . OOPHORECTOMY Left    2013     reports that she has never smoked. She has never used smokeless tobacco. She reports that she does not drink alcohol or use drugs.  No Known Allergies  History reviewed. No pertinent family history.  Prior to Admission medications   Medication Sig Start Date End Date Taking? Authorizing Provider  diazepam (VALIUM) 5 MG tablet Take 1 tablet (5 mg total) by mouth every 8 (eight) hours as needed for anxiety. Patient taking differently: Take 5 mg by mouth every 12 (twelve) hours as needed for anxiety (for dizziness also).  02/23/16  Yes Britt Bottom, MD  TECFIDERA 240 MG CPDR TAKE 1 CAPSULE BY MOUTH TWICE DAILY 10/28/15  Yes Britt Bottom, MD  amphetamine-dextroamphetamine (ADDERALL XR) 15 MG 24 hr capsule Take 1 capsule by mouth every morning. Patient not taking: Reported on 04/20/2016 03/28/16   Britt Bottom, MD  cholecalciferol (VITAMIN D) 1000 UNITS tablet Take 1,000 Units by mouth daily. otc vit. d 5,000iu daily/fim    Historical Provider, MD  CRANBERRY PO Take 1 capsule by mouth daily.    Historical Provider, MD  dimenhyDRINATE (DRAMAMINE) 50 MG tablet Take 50 mg by mouth every 8 (eight) hours as needed for dizziness.    Historical Provider, MD  nitrofurantoin (MACRODANTIN) 100 MG capsule Take 100 mg by mouth  2 (two) times daily. 04/11/16   Historical Provider, MD  oxybutynin (DITROPAN-XL) 10 MG 24 hr tablet Take 1 tablet (10 mg total) by mouth at bedtime. Patient not taking: Reported on 04/20/2016 03/07/16   Richard A Sater, MD    Physical Exam:  Constitutional: Female who appears acutely ill in moderate distress Vitals:   04/20/16 2200 04/20/16  2230 04/20/16 2245 04/21/16 0002  BP: 160/95 155/76 155/80   Pulse: 107 104 108   Resp: 16 16 21   Temp:    98.1 F (36.7 C)  TempSrc:    Oral  SpO2: 100% 100% 98%    Eyes: PERRL, lids and conjunctivae normal ENMT: Mucous membranes are dry. Posterior pharynx clear of any exudate or lesions. Normal dentition.  Neck: normal, supple, no masses, no thyromegaly Respiratory: clear to auscultation bilaterally, no wheezing, no crackles. Normal respiratory effort. No accessory muscle use.  Cardiovascular: Tachycardic, no murmurs / rubs / gallops. No extremity edema. 2+ pedal pulses. No carotid bruits.  Abdomen: Tenderness to palpation noted suprapubically, positive CVA tenderness. No hepatosplenomegaly. Bowel sounds positive.  Musculoskeletal: no clubbing / cyanosis. Able to move all extremities Skin: no rashes, lesions, ulcers. No induration Neurologic: CN 2-12 grossly intact. Sensation intact, DTR normal. Strength 4+/5 in all 4.  Psychiatric: Normal judgment and insight. Alert and oriented x 3. Normal mood.     Labs on Admission: I have personally reviewed following labs and imaging studies  CBC:  Recent Labs Lab 04/20/16 1845  WBC 21.3*  HGB 13.5  HCT 40.2  MCV 85.9  PLT 695*   Basic Metabolic Panel:  Recent Labs Lab 04/20/16 1845  NA 140  K 3.4*  CL 108  CO2 15*  GLUCOSE 137*  BUN 39*  CREATININE 2.12*  CALCIUM 10.3   GFR: CrCl cannot be calculated (Unknown ideal weight.). Liver Function Tests:  Recent Labs Lab 04/20/16 1845  AST 13*  ALT 9*  ALKPHOS 110  BILITOT 0.6  PROT 8.9*  ALBUMIN 3.8   No results for input(s): LIPASE, AMYLASE in the last 168 hours. No results for input(s): AMMONIA in the last 168 hours. Coagulation Profile: No results for input(s): INR, PROTIME in the last 168 hours. Cardiac Enzymes: No results for input(s): CKTOTAL, CKMB, CKMBINDEX, TROPONINI in the last 168 hours. BNP (last 3 results) No results for input(s): PROBNP in the  last 8760 hours. HbA1C: No results for input(s): HGBA1C in the last 72 hours. CBG: No results for input(s): GLUCAP in the last 168 hours. Lipid Profile: No results for input(s): CHOL, HDL, LDLCALC, TRIG, CHOLHDL, LDLDIRECT in the last 72 hours. Thyroid Function Tests: No results for input(s): TSH, T4TOTAL, FREET4, T3FREE, THYROIDAB in the last 72 hours. Anemia Panel: No results for input(s): VITAMINB12, FOLATE, FERRITIN, TIBC, IRON, RETICCTPCT in the last 72 hours. Urine analysis:    Component Value Date/Time   COLORURINE STRAW (A) 04/20/2016 2117   APPEARANCEUR TURBID (A) 04/20/2016 2117   LABSPEC 1.013 04/20/2016 2117   PHURINE 5.0 04/20/2016 2117   GLUCOSEU NEGATIVE 04/20/2016 2117   HGBUR LARGE (A) 04/20/2016 2117   BILIRUBINUR NEGATIVE 04/20/2016 2117   KETONESUR 5 (A) 04/20/2016 2117   PROTEINUR 100 (A) 04/20/2016 2117   NITRITE NEGATIVE 04/20/2016 2117   LEUKOCYTESUR SMALL (A) 04/20/2016 2117   Sepsis Labs: No results found for this or any previous visit (from the past 240 hour(s)).   Radiological Exams on Admission: Ct Abdomen Pelvis Wo Contrast  Result Date: 04/20/2016 CLINICAL DATA:  Weakness and   nausea beginning earlier this week and worsening. Patient fell today. Nausea, body aches, and abdominal pain. History of multiple sclerosis. EXAM: CT ABDOMEN AND PELVIS WITHOUT CONTRAST TECHNIQUE: Multidetector CT imaging of the abdomen and pelvis was performed following the standard protocol without IV contrast. COMPARISON:  None. FINDINGS: Lower chest: Lung bases are clear.  Small esophageal hiatal hernia. Hepatobiliary: No focal liver abnormality is seen. No gallstones, gallbladder wall thickening, or biliary dilatation. Pancreas: Unremarkable. No pancreatic ductal dilatation or surrounding inflammatory changes. Spleen: Normal in size without focal abnormality. Adrenals/Urinary Tract: No adrenal gland nodules. Kidneys are symmetrical. There is bilateral hydronephrosis and  hydroureter. No stones or obstructing lesions demonstrated. Changes may be due to reflux or pyelonephritis. Bladder wall is diffusely thickened which may indicate cystitis. Moderate distention of the bladder. Asymmetric nodular thickening at the dome of the bladder with surrounding infiltration could represent a bladder diverticulum, hemorrhage, or eccentric lesion. Stomach/Bowel: Stomach, small bowel, and colon are mostly decompressed. No inflammatory wall thickening is appreciated although under distention may obscure inflammatory changes. Appendix is normal. Vascular/Lymphatic: No significant vascular findings are present. No enlarged abdominal or pelvic lymph nodes. Reproductive: Uterus is anteverted with nodular enlargement suggesting fibroids. Ovaries are not enlarged. Other: No free air or free fluid in the abdomen. Abdominal wall musculature appears intact. Musculoskeletal: In the uppermost images, at the T10-T11 interspace level, there is increased density material causing anterior effacement of the thecal sac and displacement of the thoracic cord. Material is contiguous with the disc space. This is incompletely included within the field of view. This could represent calcified herniated disc material or could represent extruded cement from kyphoplasty if the patient has had this procedure done. No prior comparison studies of this area are available to indicate wound this represents acute or chronic process. Consider MRI for further evaluation of the thoracic spine. IMPRESSION: Bilateral hydronephrosis and hydroureter to the bladder. No obstructing stone or lesion identified. This could represent reflux or pyelonephritis. Diffuse thickening of the bladder wall suggesting cystitis. Asymmetric nodular thickening of the dome of the bladder with surrounding infiltration possibly representing bladder diverticulum, hemorrhage, or eccentric lesion. No evidence of bowel obstruction or inflammation. High density  anterior extradural defect at T10-11, incompletely included. This could represent calcified herniated disc material or could represent extruded cement from kyphoplasty. Consider MRI for further evaluation if clinically indicated. Electronically Signed   By: William  Stevens M.D.   On: 04/20/2016 23:45   Dg Chest Port 1 View  Result Date: 04/20/2016 CLINICAL DATA:  Tachycardia, weakness and nausea EXAM: PORTABLE CHEST 1 VIEW COMPARISON:  Chest radiograph 10/29/2015 FINDINGS: The lungs are well inflated. Cardiomediastinal contours are normal. No pneumothorax or pleural effusion. No focal airspace consolidation or pulmonary edema. IMPRESSION: Clear lungs. Electronically Signed   By: Kevin  Herman M.D.   On: 04/20/2016 21:45    EKG: Independently reviewed. Sinus tachycardia  Assessment/Plan Sepsis secondary to UTI/pyelonephritis: Acute. Patient on admission met SIRS criteria with positive urinalysis for source of infection. - MedSurg bed - Follow-up blood and urine cultures  - Empiric antibiotics of Rocephin per pharmacy  - IV fluids NS at 125ml/hr - Hydrocodone/morphine IV prn pain   Acute renal failure: Patient's baseline creatinine within normal limits. Presents with elevated BUN to creatinine ratio. Currently suspected to be multifactorial. - IV fluids  - Recheck BMP in a.m.  Bilateral hydronephrosis: Acute. seen on CT scan - Strict ins and outs - Place Foley catheter - May need consult to urology   Nausea   and vomiting - zofran prn   Multiple sclerosis with gait disturbance - continue Tecfidera - PT to eval and treat in a.m.  Anxiety/dizziness -  Valium prn   DVT prophylaxis: SCD    Code Status: Full  Family Communication: No family present at bedside   Disposition Plan: TBD Consults called: None  Admission status: Inpatient  Norval Morton MD [facility] Pager [phone]  If 7PM-7AM, please contact night-coverage [email or web address] Password [facility]  04/21/2016,  12:18 AM

## 2016-04-21 NOTE — Consult Note (Signed)
Physical Medicine and Rehabilitation Consult Reason for Consult:weakness Referring Physician: Ghimire   HPI: Crystal Park is a 56 y.o. female with hx of MS who was admitted 04/20/16 with complaints of weakness/recent UTI. Work up revealed urosepsis with acute rental failure and bilateral hydronephrosis. The patient was started on empiric rocephin and urology was consulted. Urology dxed neurogenic bladder and recommended ongoing abx, foley. The patient may need stent placement as well. Pt displaying functional deficits with therapy and PM&R was consulted to assess for potential rehab needs moving forward. Brain and lumbar MRI performed yesterday were negative for progressive disease. However, thoracic imaging notable for progression of demyelination. Cervical MRI not performed.    Review of Systems  Constitutional: Positive for chills.  HENT: Negative for tinnitus.   Eyes: Negative for double vision.  Respiratory: Negative for wheezing.   Cardiovascular: Negative for chest pain.  Gastrointestinal: Negative for nausea and vomiting.  Genitourinary: Positive for dysuria and urgency.  Musculoskeletal: Positive for myalgias.  Neurological: Positive for tingling, sensory change and focal weakness.  Psychiatric/Behavioral: Negative for substance abuse.   Past Medical History:  Diagnosis Date  . Benign paroxysmal positional vertigo 10/12/2014  . MS (multiple sclerosis) (HCC)    diagnosed 9/16   Past Surgical History:  Procedure Laterality Date  . OOPHORECTOMY Left    2013   History reviewed. No pertinent family history. Social History:  reports that she has never smoked. She has never used smokeless tobacco. She reports that she does not drink alcohol or use drugs. Allergies: No Known Allergies Medications Prior to Admission  Medication Sig Dispense Refill  . diazepam (VALIUM) 5 MG tablet Take 1 tablet (5 mg total) by mouth every 8 (eight) hours as needed for anxiety. (Patient  taking differently: Take 5 mg by mouth every 12 (twelve) hours as needed for anxiety (for dizziness also). ) 90 tablet 3  . TECFIDERA 240 MG CPDR TAKE 1 CAPSULE BY MOUTH TWICE DAILY 60 capsule 10  . amphetamine-dextroamphetamine (ADDERALL XR) 15 MG 24 hr capsule Take 1 capsule by mouth every morning. (Patient not taking: Reported on 04/20/2016) 30 capsule 0  . cholecalciferol (VITAMIN D) 1000 UNITS tablet Take 1,000 Units by mouth daily. otc vit. d 5,000iu daily/fim    . CRANBERRY PO Take 1 capsule by mouth daily.    Marland Kitchen dimenhyDRINATE (DRAMAMINE) 50 MG tablet Take 50 mg by mouth every 8 (eight) hours as needed for dizziness.    . nitrofurantoin (MACRODANTIN) 100 MG capsule Take 100 mg by mouth 2 (two) times daily.    Marland Kitchen oxybutynin (DITROPAN-XL) 10 MG 24 hr tablet Take 1 tablet (10 mg total) by mouth at bedtime. (Patient not taking: Reported on 04/20/2016) 90 tablet 3    Home: Home Living Family/patient expects to be discharged to:: Private residence Living Arrangements: Spouse/significant other Available Help at Discharge: Family, Available 24 hours/day (works from home) Type of Home: House Home Access: Level entry Home Layout: Two level, Able to live on main level with bedroom/bathroom Bathroom Shower/Tub: Health visitor: Handicapped height Bathroom Accessibility: Yes Home Equipment: [employer] - single point, Information systems manager, Environmental consultant - 2 wheels, Grab bars - tub/shower  Functional History: Prior Function Level of Independence: Independent with assistive device(s) Comments: Independent household ambulator with RW. Independent with ADLs. Functional Status:  Mobility: Bed Mobility Overal bed mobility: Needs Assistance Bed Mobility: Supine to Sit Supine to sit: Max assist General bed mobility comments: use of bed pad to scoot to EOB. Assist  to elevate trunk. Transfers Overall transfer level: Needs assistance Transfers: Squat Pivot Transfers Squat pivot transfers: Max assist General  transfer comment: pivot transfer to right bed to recliner. Therapist braced R knee to prevent buckling. Ambulation/Gait General Gait Details: unable    ADL:    Cognition: Cognition Overall Cognitive Status: Within Functional Limits for tasks assessed Orientation Level: Oriented X4 Cognition Arousal/Alertness: Awake/alert Behavior During Therapy: WFL for tasks assessed/performed Overall Cognitive Status: Within Functional Limits for tasks assessed  Blood pressure (!) 159/87, pulse (!) 110, temperature 98.3 F (36.8 C), temperature source Oral, resp. rate 18, height 5\' 5"  (1.651 m), weight 54.1 kg (119 lb 4.3 oz), last menstrual period 10/25/2015, SpO2 100 %. Physical Exam  Constitutional: No distress.  Frail appearing  Eyes: Pupils are equal, round, and reactive to light.  Neck: No thyromegaly present.  Cardiovascular: Normal rate.   Respiratory: No respiratory distress.  GI: She exhibits no distension.  Musculoskeletal: She exhibits no deformity.  Neurological:  RUE 1 to 1+/5 prox to distal. RLE tr to 1/5 prox to distal. LUE 3-4/5. LLE 3-4/5 also. Decreased LT and pain RUE and RLE. Near normal sensation LUE and LLE. DTR's 3+ throughout.   Skin: She is not diaphoretic.    Results for orders placed or performed during the hospital encounter of 04/20/16 (from the past 24 hour(s))  Comprehensive metabolic panel     Status: Abnormal   Collection Time: 04/20/16  6:45 PM  Result Value Ref Range   Sodium 140 135 - 145 mmol/L   Potassium 3.4 (L) 3.5 - 5.1 mmol/L   Chloride 108 101 - 111 mmol/L   CO2 15 (L) 22 - 32 mmol/L   Glucose, Bld 137 (H) 65 - 99 mg/dL   BUN 39 (H) 6 - 20 mg/dL   Creatinine, Ser 1.68 (H) 0.44 - 1.00 mg/dL   Calcium 37.2 8.9 - 90.2 mg/dL   Total Protein 8.9 (H) 6.5 - 8.1 g/dL   Albumin 3.8 3.5 - 5.0 g/dL   AST 13 (L) 15 - 41 U/L   ALT 9 (L) 14 - 54 U/L   Alkaline Phosphatase 110 38 - 126 U/L   Total Bilirubin 0.6 0.3 - 1.2 mg/dL   GFR calc non Af Amer 25  (L) >60 mL/min   GFR calc Af Amer 30 (L) >60 mL/min   Anion gap 17 (H) 5 - 15  CBC     Status: Abnormal   Collection Time: 04/20/16  6:45 PM  Result Value Ref Range   WBC 21.3 (H) 4.0 - 10.5 K/uL   RBC 4.68 3.87 - 5.11 MIL/uL   Hemoglobin 13.5 12.0 - 15.0 g/dL   HCT 11.1 55.2 - 08.0 %   MCV 85.9 78.0 - 100.0 fL   MCH 28.8 26.0 - 34.0 pg   MCHC 33.6 30.0 - 36.0 g/dL   RDW 22.3 36.1 - 22.4 %   Platelets 695 (H) 150 - 400 K/uL  I-Stat CG4 Lactic Acid, ED     Status: Abnormal   Collection Time: 04/20/16  7:26 PM  Result Value Ref Range   Lactic Acid, Venous 2.28 (HH) 0.5 - 1.9 mmol/L   Comment NOTIFIED PHYSICIAN   Blood Culture (routine x 2)     Status: None (Preliminary result)   Collection Time: 04/20/16  8:50 PM  Result Value Ref Range   Specimen Description BLOOD RIGHT ANTECUBITAL    Special Requests BOTTLES DRAWN AEROBIC AND ANAEROBIC 5CC    Culture NO GROWTH < 24  HOURS    Report Status PENDING   Blood Culture (routine x 2)     Status: None (Preliminary result)   Collection Time: 04/20/16  8:55 PM  Result Value Ref Range   Specimen Description BLOOD LEFT ANTECUBITAL    Special Requests AEROBIC BOTTLE ONLY 5CC    Culture NO GROWTH < 24 HOURS    Report Status PENDING   Urinalysis, Routine w reflex microscopic     Status: Abnormal   Collection Time: 04/20/16  9:17 PM  Result Value Ref Range   Color, Urine STRAW (A) YELLOW   APPearance TURBID (A) CLEAR   Specific Gravity, Urine 1.013 1.005 - 1.030   pH 5.0 5.0 - 8.0   Glucose, UA NEGATIVE NEGATIVE mg/dL   Hgb urine dipstick LARGE (A) NEGATIVE   Bilirubin Urine NEGATIVE NEGATIVE   Ketones, ur 5 (A) NEGATIVE mg/dL   Protein, ur 409 (A) NEGATIVE mg/dL   Nitrite NEGATIVE NEGATIVE   Leukocytes, UA SMALL (A) NEGATIVE   RBC / HPF TOO NUMEROUS TO COUNT 0 - 5 RBC/hpf   WBC, UA TOO NUMEROUS TO COUNT 0 - 5 WBC/hpf   Bacteria, UA MANY (A) NONE SEEN   Squamous Epithelial / LPF TOO NUMEROUS TO COUNT (A) NONE SEEN   WBC Clumps  PRESENT   I-Stat CG4 Lactic Acid, ED     Status: Abnormal   Collection Time: 04/20/16  9:18 PM  Result Value Ref Range   Lactic Acid, Venous 1.99 (HH) 0.5 - 1.9 mmol/L  Lactic acid, plasma     Status: None   Collection Time: 04/21/16  2:16 AM  Result Value Ref Range   Lactic Acid, Venous 1.0 0.5 - 1.9 mmol/L  CBC     Status: Abnormal   Collection Time: 04/21/16  2:17 AM  Result Value Ref Range   WBC 16.1 (H) 4.0 - 10.5 K/uL   RBC 3.89 3.87 - 5.11 MIL/uL   Hemoglobin 10.8 (L) 12.0 - 15.0 g/dL   HCT 81.1 (L) 91.4 - 78.2 %   MCV 86.1 78.0 - 100.0 fL   MCH 27.8 26.0 - 34.0 pg   MCHC 32.2 30.0 - 36.0 g/dL   RDW 95.6 21.3 - 08.6 %   Platelets 547 (H) 150 - 400 K/uL  Basic metabolic panel     Status: Abnormal   Collection Time: 04/21/16  2:17 AM  Result Value Ref Range   Sodium 143 135 - 145 mmol/L   Potassium 3.2 (L) 3.5 - 5.1 mmol/L   Chloride 116 (H) 101 - 111 mmol/L   CO2 14 (L) 22 - 32 mmol/L   Glucose, Bld 112 (H) 65 - 99 mg/dL   BUN 30 (H) 6 - 20 mg/dL   Creatinine, Ser 5.78 (H) 0.44 - 1.00 mg/dL   Calcium 8.3 (L) 8.9 - 10.3 mg/dL   GFR calc non Af Amer 41 (L) >60 mL/min   GFR calc Af Amer 47 (L) >60 mL/min   Anion gap 13 5 - 15  Lactic acid, plasma     Status: None   Collection Time: 04/21/16  5:58 AM  Result Value Ref Range   Lactic Acid, Venous 0.8 0.5 - 1.9 mmol/L   Ct Abdomen Pelvis Wo Contrast  Result Date: 04/20/2016 CLINICAL DATA:  Weakness and nausea beginning earlier this week and worsening. Patient fell today. Nausea, body aches, and abdominal pain. History of multiple sclerosis. EXAM: CT ABDOMEN AND PELVIS WITHOUT CONTRAST TECHNIQUE: Multidetector CT imaging of the abdomen and pelvis was  performed following the standard protocol without IV contrast. COMPARISON:  None. FINDINGS: Lower chest: Lung bases are clear.  Small esophageal hiatal hernia. Hepatobiliary: No focal liver abnormality is seen. No gallstones, gallbladder wall thickening, or biliary dilatation.  Pancreas: Unremarkable. No pancreatic ductal dilatation or surrounding inflammatory changes. Spleen: Normal in size without focal abnormality. Adrenals/Urinary Tract: No adrenal gland nodules. Kidneys are symmetrical. There is bilateral hydronephrosis and hydroureter. No stones or obstructing lesions demonstrated. Changes may be due to reflux or pyelonephritis. Bladder wall is diffusely thickened which may indicate cystitis. Moderate distention of the bladder. Asymmetric nodular thickening at the dome of the bladder with surrounding infiltration could represent a bladder diverticulum, hemorrhage, or eccentric lesion. Stomach/Bowel: Stomach, small bowel, and colon are mostly decompressed. No inflammatory wall thickening is appreciated although under distention may obscure inflammatory changes. Appendix is normal. Vascular/Lymphatic: No significant vascular findings are present. No enlarged abdominal or pelvic lymph nodes. Reproductive: Uterus is anteverted with nodular enlargement suggesting fibroids. Ovaries are not enlarged. Other: No free air or free fluid in the abdomen. Abdominal wall musculature appears intact. Musculoskeletal: In the uppermost images, at the T10-T11 interspace level, there is increased density material causing anterior effacement of the thecal sac and displacement of the thoracic cord. Material is contiguous with the disc space. This is incompletely included within the field of view. This could represent calcified herniated disc material or could represent extruded cement from kyphoplasty if the patient has had this procedure done. No prior comparison studies of this area are available to indicate wound this represents acute or chronic process. Consider MRI for further evaluation of the thoracic spine. IMPRESSION: Bilateral hydronephrosis and hydroureter to the bladder. No obstructing stone or lesion identified. This could represent reflux or pyelonephritis. Diffuse thickening of the bladder  wall suggesting cystitis. Asymmetric nodular thickening of the dome of the bladder with surrounding infiltration possibly representing bladder diverticulum, hemorrhage, or eccentric lesion. No evidence of bowel obstruction or inflammation. High density anterior extradural defect at T10-11, incompletely included. This could represent calcified herniated disc material or could represent extruded cement from kyphoplasty. Consider MRI for further evaluation if clinically indicated. Electronically Signed   By: Burman Nieves M.D.   On: 04/20/2016 23:45   Mr Brain Wo Contrast  Result Date: 04/21/2016 CLINICAL DATA:  56 year old female with multiple sclerosis. Presents with complaint of Generalized weakness, right upper extremity weakness. Initial encounter. EXAM: MRI HEAD WITHOUT CONTRAST TECHNIQUE: Multiplanar, multiecho pulse sequences of the brain and surrounding structures were obtained without intravenous contrast. COMPARISON:  Brain MRI 10/29/2015 and earlier. FINDINGS: Brain: Stable cerebral volume. No restricted diffusion to suggest acute infarction. No midline shift, mass effect, evidence of mass lesion, ventriculomegaly, extra-axial collection or acute intracranial hemorrhage. Cervicomedullary junction and pituitary are within normal limits. Patchy in scattered bilateral cerebral white matter T2 and FLAIR hyperintensity which is both periventricular and subcortical appears stable since August 2017. Left greater than right temporal lobe white matter involvement again noted. Nodular lesions in the anterior frontal lobes appear stable. Subcortical but no cerebral cortical involvement identified. Stable corpus callosum. Deep gray matter nuclei appear stable. Medial and lateral right thalamic involvement appears chronic (series 7, image 13). The brainstem appears stable and is not definitely affected. Confluent chronic left posterior cerebellar encephalomalacia is stable. No new signal abnormality identified.  No IV contrast administered. No chronic cerebral blood products. Vascular: Major intracranial vascular flow voids are stable. Skull and upper cervical spine: Grossly stable. Visualized bone marrow signal is within normal limits.  Sinuses/Orbits: Orbits soft tissues appear stable and normal. Visualized paranasal sinuses and mastoids are stable and well pneumatized. Other: Visible internal auditory structures appear normal. Negative scalp soft tissues. IMPRESSION: 1. Stable noncontrast MRI appearance of demyelinating disease since August 2017. 2. No new intracranial abnormality identified. Electronically Signed   By: Odessa Fleming M.D.   On: 04/21/2016 13:18   Mr Lumbar Spine Wo Contrast  Result Date: 04/21/2016 CLINICAL DATA:  56 year old with multiple sclerosis. Generalized weakness. Recent antibiotic therapy for urinary tract infection. EXAM: MRI LUMBAR SPINE WITHOUT CONTRAST TECHNIQUE: Multiplanar, multisequence MR imaging of the lumbar spine was performed. No intravenous contrast was administered. COMPARISON:  Abdominopelvic CT 04/20/2016.  Thoracic MRI 11/03/2015. FINDINGS: Segmentation: Conventional anatomy assumed, with the last open disc space designated L5-S1. Alignment: Stable mild convex left scoliosis. The lateral alignment is normal. Vertebrae: No worrisome osseous lesion, acute fracture or pars defect. The visualized sacroiliac joints appear unremarkable. Conus medullaris: Extends to the L1-2 level and appears normal. Paraspinal and other soft tissues: No significant paraspinal findings. Persistent bilateral hydronephrosis and hydroureter as seen on recent abdominal CT. Bladder is not imaged on this study. Disc levels: Sagittal images extend from T11 through the mid sacrum. The abnormality at T10-11 seen on the abdominal CT is not imaged by this examination. Patient did have a central disc herniation with posterior osteophytes at T10-11 on prior thoracic MRI. The T11-12, T12-L1 and L1-2 disc space levels  appear normal. L2-3: Mild disc desiccation, bulging and central annular fissure. No disc herniation or spinal stenosis. L3-4: Normal interspace. L4-5: Disc height and hydration are maintained. Mild bilateral facet hypertrophy. No spinal stenosis or nerve root encroachment. L5-S1: Disc height and hydration are maintained. Mild bilateral facet hypertrophy. No spinal stenosis or nerve root encroachment. IMPRESSION: 1. No acute findings demonstrated within the lumbar spine. 2. Mild disc degeneration at L2-3 without resulting spinal stenosis or nerve root encroachment. 3. The abnormality described at T10-11 on recent abdominopelvic CT is not included on this examination of the lumbar spine. Consider further evaluation with follow-up of thoracic MRI. Patient did have a disc herniation at that level on prior thoracic study done 11/03/2015. 4. Persistent bilateral hydronephrosis and hydroureter of undetermined etiology. Electronically Signed   By: Carey Bullocks M.D.   On: 04/21/2016 13:36   Dg Chest Port 1 View  Result Date: 04/20/2016 CLINICAL DATA:  Tachycardia, weakness and nausea EXAM: PORTABLE CHEST 1 VIEW COMPARISON:  Chest radiograph 10/29/2015 FINDINGS: The lungs are well inflated. Cardiomediastinal contours are normal. No pneumothorax or pleural effusion. No focal airspace consolidation or pulmonary edema. IMPRESSION: Clear lungs. Electronically Signed   By: Deatra Robinson M.D.   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 Thoracic imaging reveals progression of disease and cervical imaging not performed.  1. Does the need for close, 24 hr/day medical supervision in concert with the patient's rehab needs make it unreasonable for this patient to be served in a less intensive setting? Yes 2. Co-Morbidities requiring supervision/potential complications: neurogenic bladder, urosepsis, ?neurogenic bowel, MS treatment and further  assessment 3. Due to bladder management, bowel management, safety, skin/wound care, disease management, medication administration, pain management and patient education, does the patient require 24 hr/day rehab nursing? Yes 4. Does the patient require coordinated care of a physician, rehab nurse, PT (1-2 hrs/day, 5 days/week), OT (1-2 hrs/day, 5 days/week) and SLP (1-2 hrs/day, 5 days/week) to address physical and functional deficits in the context of the above medical  diagnosis(es)? Yes Addressing deficits in the following areas: balance, endurance, locomotion, strength, transferring, bowel/bladder control, bathing, dressing, feeding, grooming, toileting, cognition and psychosocial support 5. Can the patient actively participate in an intensive therapy program of at least 3 hrs of therapy per day at least 5 days per week? Yes 6. The potential for patient to make measurable gains while on inpatient rehab is good 7. Anticipated functional outcomes upon discharge from inpatient rehab are supervision and min assist  with PT, supervision and min assist with OT, modified independent and supervision with SLP. 8. Estimated rehab length of stay to reach the above functional goals is: 17-22 days 9. Does the patient have adequate social supports and living environment to accommodate these discharge functional goals? Yes 10. Anticipated D/C setting: Home 11. Anticipated post D/C treatments: HH therapy and Outpatient therapy 12. Overall Rehab/Functional Prognosis: excellent  RECOMMENDATIONS: This patient's condition is appropriate for continued rehabilitative care in the following setting: CIR Patient has agreed to participate in recommended program. Yes Note that insurance prior authorization may be required for reimbursement for recommended care.  Comment: Pt needs cervical MRI and formal neurological assessment. She has sustained substantial motor loss of the RUE and RLE (greater than LUE/LLE) with sensory  loss, hyper-reflexia, and neurogenic bladder. (pt was ambulatory with walker one week ago). August 2017 MRI with notable cervical disease. Pt needs inpatient rehab most certainly, but first requires further work up and treatment plan regarding progression of her MS.  Have reached out to Dr. Thedore Mins to discuss.  Rehab Admissions Coordinator to follow up.  Thanks,  [name], MD, [place] [facility]    [name], MD 04/21/2016

## 2016-04-21 NOTE — Progress Notes (Signed)
Pharmacy Antibiotic Note  Crystal Park is a 56 y.o. female admitted on 04/20/2016 with UTI.  Pharmacy has been consulted for Ceftriaxone dosing. WBC elevated.   Plan: -Ceftriaxone 1g IV q24h -F/U urine culture for directed therapy  Height: 5\' 5"  (165.1 cm) Weight: 119 lb 4.3 oz (54.1 kg) IBW/kg (Calculated) : 57  Temp (24hrs), Avg:98 F (36.7 C), Min:97.4 F (36.3 C), Max:98.3 F (36.8 C)   Recent Labs Lab 04/20/16 1845 04/20/16 1926 04/20/16 2118  WBC 21.3*  --   --   CREATININE 2.12*  --   --   LATICACIDVEN  --  2.28* 1.99*    Estimated Creatinine Clearance: 26.2 mL/min (by C-G formula based on SCr of 2.12 mg/dL (H)).    No Known Allergies    Abran Duke 04/21/2016 1:04 AM

## 2016-04-21 NOTE — Progress Notes (Signed)
Crystal Napoleon, RN inserted foley cath per MD order for urinary retention without difficulty. Pt returned cloudy yellow urine at first and now has bright red urine through foley catheter, total of 450cc noted. Notified Dr. Katrinka Blazing of bright red urine in foley catheter. Will continue to monitor pt. Nelda Marseille, RN

## 2016-04-21 NOTE — Evaluation (Signed)
Physical Therapy Evaluation [name] MRN: [id number] DOB: 08/24/1960 Today's Date: 04/21/2016   History of Present Illness  56 y.o. female with medical history significant of multiple sclerosis; who presented with complaints of generalized weakness. Pt admitted with sepsis due to UTI.   Clinical Impression  Pt admitted with above diagnosis. Pt currently with functional limitations due to the deficits listed below (see PT Problem List). On eval, pt required max assist bed mobility and pivot transfer to recliner. Pt able to maintain sitting balance EOB with supervision and unilat UE support. PTA, pt ambulated mod I household distances with RW.  Pt will benefit from skilled PT to increase their independence and safety with mobility to allow discharge to the venue listed below.       Follow Up Recommendations CIR    Equipment Recommendations  Other (comment) (TBD, if d/c'ing home. Pt would need w/c.)    Recommendations for Other Services OT consult;Rehab consult     Precautions / Restrictions Precautions Precautions: Fall      Mobility  Bed Mobility Overal bed mobility: Needs Assistance Bed Mobility: Supine to Sit     Supine to sit: Max assist     General bed mobility comments: use of bed pad to scoot to EOB. Assist to elevate trunk.  Transfers Overall transfer level: Needs assistance   Transfers: Squat Pivot Transfers     Squat pivot transfers: Max assist     General transfer comment: pivot transfer to right bed to recliner. Therapist braced R knee to prevent buckling.  Ambulation/Gait             General Gait Details: unable  Stairs            Wheelchair Mobility    Modified Rankin (Stroke Patients Only)       Balance Overall balance assessment: Needs assistance Sitting-balance support: Single extremity supported;Feet supported Sitting balance-Leahy Scale: Fair     Standing balance support: During functional  activity;Single extremity supported Standing balance-Leahy Scale: Zero                               Pertinent Vitals/Pain Pain Assessment: No/denies pain    Home Living Family/patient expects to be discharged to:: Private residence Living Arrangements: Spouse/significant other Available Help at Discharge: Family;Available 24 hours/day (works from home) Type of Home: House Home Access: Level entry     Home Layout: Two level;Able to live on main level with bedroom/bathroom Home Equipment: Gilmer Mor - single point;Shower seat;Walker - 2 wheels;Grab bars - tub/shower      Prior Function Level of Independence: Independent with assistive device(s)         Comments: Independent household ambulator with RW. Independent with ADLs.     Hand Dominance   Dominant Hand: Right    Extremity/Trunk Assessment   Upper Extremity Assessment Upper Extremity Assessment: RUE deficits/detail RUE Deficits / Details: RUE weaker side from MS    Lower Extremity Assessment Lower Extremity Assessment: RLE deficits/detail;LLE deficits/detail RLE Deficits / Details: weaker side from MS, grossly </= 2/5 LLE Deficits / Details: grossly 3/5    Cervical / Trunk Assessment Cervical / Trunk Assessment: Kyphotic  Communication   Communication: No difficulties  Cognition Arousal/Alertness: Awake/alert Behavior During Therapy: WFL for tasks assessed/performed Overall Cognitive Status: Within Functional Limits for tasks assessed  General Comments      Exercises     Assessment/Plan    PT Assessment Patient needs continued PT services  PT Problem List Decreased strength;Decreased activity tolerance;Decreased balance;Decreased mobility          PT Treatment Interventions DME instruction;Gait training;Functional mobility training;Therapeutic activities;Therapeutic exercise;Balance training;Patient/family education    PT Goals (Current goals can be found in  the Care Plan section)  Acute Rehab PT Goals Patient Stated Goal: home PT Goal Formulation: With patient/family Time For Goal Achievement: 05/05/16 Potential to Achieve Goals: Good    Frequency Min 4X/week   Barriers to discharge        Co-evaluation               End of Session Equipment Utilized During Treatment: Gait belt Activity Tolerance: Patient limited by fatigue Patient left: in chair;with call bell/phone within reach;with family/visitor present Nurse Communication: Mobility status         Time: 1000-1040 PT Time Calculation (min) (ACUTE ONLY): 40 min   Charges:   PT Evaluation $PT Eval Moderate Complexity: 1 Procedure PT Treatments $Therapeutic Activity: 23-37 mins   PT G Codes:        Ilda Foil 04/21/2016, 11:09 AM

## 2016-04-21 NOTE — Progress Notes (Signed)
PROGRESS NOTE        [name] Age: 56 y.o. Sex: female Date of Birth: 08/25/1960 Admit Date: 04/20/2016 Admitting Physician Clydie Braun, MD [id number] Tenny Craw, MD  Brief Narrative: Patient is a 56 y.o. female history of multiple sclerosis on Tecfidera, recent history of UTI completed Macrobid this past Wednesday-presented to the hospital with several days history of  lower abdominal pain, and generalized weakness. Apparently her generalized weakness worsened over the past few days to the extent that she could no longer walk. She complains of mostly right lower extremity and some right upper extremity weakness. In the ED, she was found to have sepsis secondary to UTI, acute renal failure-a CT scan demonstrated bilateral hydronephrosis. A Foley catheter was placed, she was started on empiric Rocephin and admitted for further evaluation and treatment.  Subjective: Poor historian-seen earlier this morning with no family member at bedside. Claims that she continues to have some abdominal pain although better-claims right lower extremity weakness is unchanged. Mild hematuria observed in Foley catheter.  Assessment/Plan: Sepsis secondary to UTI: Sepsis pathophysiology has resolved, continue Rocephin and await culture data.  Bilateral hydronephrosis: Apparently saw a urologist 2 weeks back-currently Foley catheter in place-consulted urology-plans are for potential stent placement.   Mild hematuria: Not sure if this is related to Foley catheter or borderline UTI. Continue antibiotic treatment, await urology evaluation.  Acute renal failure: Although she has bilateral hydronephrosis-I suspect renal failure is mostly due to hemodynamically mediated kidney injury in a setting of sepsis-renal function has improved with IV fluids. Continue to follow electrolytes.  Hypokalemia: Replete and recheck.  Right lower>Right Upper Ext weakness: Poor  historian-but claims that she has difficulty ambulating at baseline uses a cane/walker. She currently seems to have more weakness on her right lower extremity (barely able to lift against gravity) and some mild weakness in her right upper extremity. Reflexes are preserved. Appears to have intact sensation as well. MRI of the lumbar spine and brain without any acute abnormalities-no evidence of new demyelinating injuries. Spoke with neurology on call-Dr. Lamonte Richer discussed in detail-he thought that these deficits were secondary to underlying sepsis in a setting of MS. Recommendations were to continue to treat underlying sepsis and follow. If no improvement over the next few days, will consult neurology officially. Continue physical therapy evaluation. Will order a MRI of the thoracic spine as recommended by radiology (see MRI report)  History of multiple sclerosis: See above discussion-continue dimethyl fumarate.  Anxiety/dizziness: Continue Valium as needed  DVT Prophylaxis: SCD's-hold chemical prophylaxis given hematuria and plans for urological procedure.  Code Status: Full code   Family Communication: Spouse at bedside-later during the day.  Disposition Plan: Remain inpatient-but will plan on Home health vs SNF on discharge, requires several more days of hospitalization  Antimicrobial agents: Anti-infectives    Start     Dose/Rate Route Frequency Ordered Stop   04/21/16 2200  cefTRIAXone (ROCEPHIN) 1 g in dextrose 5 % 50 mL IVPB     1 g 100 mL/hr over 30 Minutes Intravenous Every 24 hours 04/21/16 0104     04/20/16 2045  cefTRIAXone (ROCEPHIN) 1 g in dextrose 5 % 50 mL IVPB     1 g 100 mL/hr over 30 Minutes Intravenous  Once 04/20/16 2038 04/20/16 2133      Procedures: None  CONSULTS:  urology  Time spent: 25- minutes-Greater than 50% of this time was spent in counseling, explanation of diagnosis, planning of further management, and coordination of  care.  MEDICATIONS: Scheduled Meds: . cefTRIAXone (ROCEPHIN)  IV  1 g Intravenous Q24H  . Dimethyl Fumarate  1 capsule Oral BID  . feeding supplement (ENSURE ENLIVE)  237 mL Oral BID BM  . [START ON 04/22/2016] Influenza vac split quadrivalent PF  0.5 mL Intramuscular Tomorrow-1000  . mouth rinse  15 mL Mouth Rinse BID   Continuous Infusions: . sodium chloride 100 mL/hr at 04/21/16 0610   PRN Meds:.acetaminophen **OR** acetaminophen, albuterol, diazepam, dimenhyDRINATE, HYDROcodone-acetaminophen, morphine injection, ondansetron **OR** ondansetron (ZOFRAN) IV   PHYSICAL EXAM: Vital signs: Vitals:   04/21/16 0002 04/21/16 0028 04/21/16 0039 04/21/16 0622  BP:  (!) 155/69  139/77  Pulse:  (!) 101  (!) 106  Resp:  20  18  Temp: 98.1 F (36.7 C) 98 F (36.7 C)  97.9 F (36.6 C)  TempSrc: Oral Oral  Oral  SpO2:  100%  100%  Weight:   54.1 kg (119 lb 4.3 oz)   Height:   5\' 5"  (1.651 m)    Filed Weights   04/21/16 0039  Weight: 54.1 kg (119 lb 4.3 oz)   Body mass index is 19.85 kg/m.   General appearance :Awake, alert, not in any distress. Speech Clear.Looks chronically ill appearing. Eyes:, pupils equally reactive to light and accomodation,no scleral icterus. HEENT: Atraumatic and Normocephalic Neck: supple, no JVD. No cervical lymphadenopathy.  Resp:Good air entry bilaterally, no added sounds  CVS: S1 S2 regular, slightly tachycardic GI: Bowel sounds present, mildly tender in the pelvic area without any peritoneal signs. Extremities: B/L Lower Ext shows no edema, both legs are warm to touch Neurology:  speech is slow but clear-she has disproportionately more right-sided weakness than her left. Right lower extremity around 2-3/5, right upper extremity around3-4/5 Musculoskeletal:No digital cyanosis Skin:No Rash, warm and dry Wounds:N/A  I have personally reviewed following labs and imaging studies  LABORATORY DATA: CBC:  Recent Labs Lab 04/20/16 1845 04/21/16 0217   WBC 21.3* 16.1*  HGB 13.5 10.8*  HCT 40.2 33.5*  MCV 85.9 86.1  PLT 695* 547*    Basic Metabolic Panel:  Recent Labs Lab 04/20/16 1845 04/21/16 0217  NA 140 143  K 3.4* 3.2*  CL 108 116*  CO2 15* 14*  GLUCOSE 137* 112*  BUN 39* 30*  CREATININE 2.12* 1.43*  CALCIUM 10.3 8.3*    GFR: Estimated Creatinine Clearance: 38.9 mL/min (by C-G formula based on SCr of 1.43 mg/dL (H)).  Liver Function Tests:  Recent Labs Lab 04/20/16 1845  AST 13*  ALT 9*  ALKPHOS 110  BILITOT 0.6  PROT 8.9*  ALBUMIN 3.8   No results for input(s): LIPASE, AMYLASE in the last 168 hours. No results for input(s): AMMONIA in the last 168 hours.  Coagulation Profile: No results for input(s): INR, PROTIME in the last 168 hours.  Cardiac Enzymes: No results for input(s): CKTOTAL, CKMB, CKMBINDEX, TROPONINI in the last 168 hours.  BNP (last 3 results) No results for input(s): PROBNP in the last 8760 hours.  HbA1C: No results for input(s): HGBA1C in the last 72 hours.  CBG: No results for input(s): GLUCAP in the last 168 hours.  Lipid Profile: No results for input(s): CHOL, HDL, LDLCALC, TRIG, CHOLHDL, LDLDIRECT in the last 72 hours.  Thyroid Function Tests: No results for input(s): TSH, T4TOTAL, FREET4, T3FREE, THYROIDAB in the last 72 hours.  Anemia  Panel: No results for input(s): VITAMINB12, FOLATE, FERRITIN, TIBC, IRON, RETICCTPCT in the last 72 hours.  Urine analysis:    Component Value Date/Time   COLORURINE STRAW (A) 04/20/2016 2117   APPEARANCEUR TURBID (A) 04/20/2016 2117   LABSPEC 1.013 04/20/2016 2117   PHURINE 5.0 04/20/2016 2117   GLUCOSEU NEGATIVE 04/20/2016 2117   HGBUR LARGE (A) 04/20/2016 2117   BILIRUBINUR NEGATIVE 04/20/2016 2117   KETONESUR 5 (A) 04/20/2016 2117   PROTEINUR 100 (A) 04/20/2016 2117   NITRITE NEGATIVE 04/20/2016 2117   LEUKOCYTESUR SMALL (A) 04/20/2016 2117    Sepsis Labs: Lactic Acid, Venous    Component Value Date/Time    LATICACIDVEN 0.8 04/21/2016 0558    MICROBIOLOGY: Recent Results (from the past 240 hour(s))  Blood Culture (routine x 2)     Status: None (Preliminary result)   Collection Time: 04/20/16  8:50 PM  Result Value Ref Range Status   Specimen Description BLOOD RIGHT ANTECUBITAL  Final   Special Requests BOTTLES DRAWN AEROBIC AND ANAEROBIC 5CC  Final   Culture NO GROWTH < 24 HOURS  Final   Report Status PENDING  Incomplete  Blood Culture (routine x 2)     Status: None (Preliminary result)   Collection Time: 04/20/16  8:55 PM  Result Value Ref Range Status   Specimen Description BLOOD LEFT ANTECUBITAL  Final   Special Requests AEROBIC BOTTLE ONLY 5CC  Final   Culture NO GROWTH < 24 HOURS  Final   Report Status PENDING  Incomplete    RADIOLOGY STUDIES/RESULTS: Ct Abdomen Pelvis Wo Contrast  Result Date: 04/20/2016 CLINICAL DATA:  Weakness and nausea beginning earlier this week and worsening. Patient fell today. Nausea, body aches, and abdominal pain. History of multiple sclerosis. EXAM: CT ABDOMEN AND PELVIS WITHOUT CONTRAST TECHNIQUE: Multidetector CT imaging of the abdomen and pelvis was performed following the standard protocol without IV contrast. COMPARISON:  None. FINDINGS: Lower chest: Lung bases are clear.  Small esophageal hiatal hernia. Hepatobiliary: No focal liver abnormality is seen. No gallstones, gallbladder wall thickening, or biliary dilatation. Pancreas: Unremarkable. No pancreatic ductal dilatation or surrounding inflammatory changes. Spleen: Normal in size without focal abnormality. Adrenals/Urinary Tract: No adrenal gland nodules. Kidneys are symmetrical. There is bilateral hydronephrosis and hydroureter. No stones or obstructing lesions demonstrated. Changes may be due to reflux or pyelonephritis. Bladder wall is diffusely thickened which may indicate cystitis. Moderate distention of the bladder. Asymmetric nodular thickening at the dome of the bladder with surrounding  infiltration could represent a bladder diverticulum, hemorrhage, or eccentric lesion. Stomach/Bowel: Stomach, small bowel, and colon are mostly decompressed. No inflammatory wall thickening is appreciated although under distention may obscure inflammatory changes. Appendix is normal. Vascular/Lymphatic: No significant vascular findings are present. No enlarged abdominal or pelvic lymph nodes. Reproductive: Uterus is anteverted with nodular enlargement suggesting fibroids. Ovaries are not enlarged. Other: No free air or free fluid in the abdomen. Abdominal wall musculature appears intact. Musculoskeletal: In the uppermost images, at the T10-T11 interspace level, there is increased density material causing anterior effacement of the thecal sac and displacement of the thoracic cord. Material is contiguous with the disc space. This is incompletely included within the field of view. This could represent calcified herniated disc material or could represent extruded cement from kyphoplasty if the patient has had this procedure done. No prior comparison studies of this area are available to indicate wound this represents acute or chronic process. Consider MRI for further evaluation of the thoracic spine. IMPRESSION: Bilateral hydronephrosis and hydroureter  to the bladder. No obstructing stone or lesion identified. This could represent reflux or pyelonephritis. Diffuse thickening of the bladder wall suggesting cystitis. Asymmetric nodular thickening of the dome of the bladder with surrounding infiltration possibly representing bladder diverticulum, hemorrhage, or eccentric lesion. No evidence of bowel obstruction or inflammation. High density anterior extradural defect at T10-11, incompletely included. This could represent calcified herniated disc material or could represent extruded cement from kyphoplasty. Consider MRI for further evaluation if clinically indicated. Electronically Signed   By: Burman Nieves M.D.   On:  04/20/2016 23:45   Mr Brain Wo Contrast  Result Date: 04/21/2016 CLINICAL DATA:  56 year old female with multiple sclerosis. Presents with complaint of Generalized weakness, right upper extremity weakness. Initial encounter. EXAM: MRI HEAD WITHOUT CONTRAST TECHNIQUE: Multiplanar, multiecho pulse sequences of the brain and surrounding structures were obtained without intravenous contrast. COMPARISON:  Brain MRI 10/29/2015 and earlier. FINDINGS: Brain: Stable cerebral volume. No restricted diffusion to suggest acute infarction. No midline shift, mass effect, evidence of mass lesion, ventriculomegaly, extra-axial collection or acute intracranial hemorrhage. Cervicomedullary junction and pituitary are within normal limits. Patchy in scattered bilateral cerebral white matter T2 and FLAIR hyperintensity which is both periventricular and subcortical appears stable since August 2017. Left greater than right temporal lobe white matter involvement again noted. Nodular lesions in the anterior frontal lobes appear stable. Subcortical but no cerebral cortical involvement identified. Stable corpus callosum. Deep gray matter nuclei appear stable. Medial and lateral right thalamic involvement appears chronic (series 7, image 13). The brainstem appears stable and is not definitely affected. Confluent chronic left posterior cerebellar encephalomalacia is stable. No new signal abnormality identified. No IV contrast administered. No chronic cerebral blood products. Vascular: Major intracranial vascular flow voids are stable. Skull and upper cervical spine: Grossly stable. Visualized bone marrow signal is within normal limits. Sinuses/Orbits: Orbits soft tissues appear stable and normal. Visualized paranasal sinuses and mastoids are stable and well pneumatized. Other: Visible internal auditory structures appear normal. Negative scalp soft tissues. IMPRESSION: 1. Stable noncontrast MRI appearance of demyelinating disease since August  2017. 2. No new intracranial abnormality identified. Electronically Signed   By: Odessa Fleming M.D.   On: 04/21/2016 13:18   Mr Lumbar Spine Wo Contrast  Result Date: 04/21/2016 CLINICAL DATA:  56 year old with multiple sclerosis. Generalized weakness. Recent antibiotic therapy for urinary tract infection. EXAM: MRI LUMBAR SPINE WITHOUT CONTRAST TECHNIQUE: Multiplanar, multisequence MR imaging of the lumbar spine was performed. No intravenous contrast was administered. COMPARISON:  Abdominopelvic CT 04/20/2016.  Thoracic MRI 11/03/2015. FINDINGS: Segmentation: Conventional anatomy assumed, with the last open disc space designated L5-S1. Alignment: Stable mild convex left scoliosis. The lateral alignment is normal. Vertebrae: No worrisome osseous lesion, acute fracture or pars defect. The visualized sacroiliac joints appear unremarkable. Conus medullaris: Extends to the L1-2 level and appears normal. Paraspinal and other soft tissues: No significant paraspinal findings. Persistent bilateral hydronephrosis and hydroureter as seen on recent abdominal CT. Bladder is not imaged on this study. Disc levels: Sagittal images extend from T11 through the mid sacrum. The abnormality at T10-11 seen on the abdominal CT is not imaged by this examination. Patient did have a central disc herniation with posterior osteophytes at T10-11 on prior thoracic MRI. The T11-12, T12-L1 and L1-2 disc space levels appear normal. L2-3: Mild disc desiccation, bulging and central annular fissure. No disc herniation or spinal stenosis. L3-4: Normal interspace. L4-5: Disc height and hydration are maintained. Mild bilateral facet hypertrophy. No spinal stenosis or nerve root encroachment. L5-S1:  Disc height and hydration are maintained. Mild bilateral facet hypertrophy. No spinal stenosis or nerve root encroachment. IMPRESSION: 1. No acute findings demonstrated within the lumbar spine. 2. Mild disc degeneration at L2-3 without resulting spinal stenosis  or nerve root encroachment. 3. The abnormality described at T10-11 on recent abdominopelvic CT is not included on this examination of the lumbar spine. Consider further evaluation with follow-up of thoracic MRI. Patient did have a disc herniation at that level on prior thoracic study done 11/03/2015. 4. Persistent bilateral hydronephrosis and hydroureter of undetermined etiology. Electronically Signed   By: Carey Bullocks M.D.   On: 04/21/2016 13:36   Dg Chest Port 1 View  Result Date: 04/20/2016 CLINICAL DATA:  Tachycardia, weakness and nausea EXAM: PORTABLE CHEST 1 VIEW COMPARISON:  Chest radiograph 10/29/2015 FINDINGS: The lungs are well inflated. Cardiomediastinal contours are normal. No pneumothorax or pleural effusion. No focal airspace consolidation or pulmonary edema. IMPRESSION: Clear lungs. Electronically Signed   By: Deatra Robinson M.D.   On: 04/20/2016 21:45     LOS: 1 day   Jeoffrey Massed, MD  [facility] Pager:[phone]  If 7PM-7AM, please contact night-coverage [email or web address] Password [facility] 04/21/2016, 2:24 PM

## 2016-04-21 NOTE — Progress Notes (Signed)
Pt admitted to 5w39 from ED. Pt lives at home with husband and uses walker. Pt's skin is warm, dry and intact. Pt has bilateral foot drop. Pt and husband oriented to room. Pt placed on bed alarm and told to call for assistance before getting up. Pt stated understanding. Placed pt on telemetry. Will continue to monitor pt. Nelda Marseille, RN

## 2016-04-22 DIAGNOSIS — A419 Sepsis, unspecified organism: Principal | ICD-10-CM

## 2016-04-22 DIAGNOSIS — N390 Urinary tract infection, site not specified: Secondary | ICD-10-CM

## 2016-04-22 LAB — CBC
HCT: 34.2 % — ABNORMAL LOW (ref 36.0–46.0)
Hemoglobin: 11 g/dL — ABNORMAL LOW (ref 12.0–15.0)
MCH: 27.8 pg (ref 26.0–34.0)
MCHC: 32.2 g/dL (ref 30.0–36.0)
MCV: 86.6 fL (ref 78.0–100.0)
Platelets: 554 10*3/uL — ABNORMAL HIGH (ref 150–400)
RBC: 3.95 MIL/uL (ref 3.87–5.11)
RDW: 15.6 % — ABNORMAL HIGH (ref 11.5–15.5)
WBC: 14.6 10*3/uL — ABNORMAL HIGH (ref 4.0–10.5)

## 2016-04-22 LAB — GLUCOSE, CAPILLARY: Glucose-Capillary: 81 mg/dL (ref 65–99)

## 2016-04-22 LAB — BASIC METABOLIC PANEL
Anion gap: 9 (ref 5–15)
BUN: 15 mg/dL (ref 6–20)
CO2: 20 mmol/L — ABNORMAL LOW (ref 22–32)
Calcium: 8.7 mg/dL — ABNORMAL LOW (ref 8.9–10.3)
Chloride: 120 mmol/L — ABNORMAL HIGH (ref 101–111)
Creatinine, Ser: 0.75 mg/dL (ref 0.44–1.00)
GFR calc Af Amer: >60 mL/min (ref >60)
GFR calc non Af Amer: >60 mL/min (ref >60)
Glucose, Bld: 109 mg/dL — ABNORMAL HIGH (ref 65–99)
Potassium: 3.4 mmol/L — ABNORMAL LOW (ref 3.5–5.1)
Sodium: 149 mmol/L — ABNORMAL HIGH (ref 135–145)

## 2016-04-22 MED ORDER — DEXTROSE 5 % IV SOLN
INTRAVENOUS | Status: AC
  Administered 2016-04-22 – 2016-04-23 (×2): via INTRAVENOUS

## 2016-04-22 MED ORDER — SODIUM CHLORIDE 0.9 % IV SOLN: INTRAVENOUS | Status: DC

## 2016-04-22 MED ORDER — POTASSIUM CHLORIDE 20 MEQ/15ML (10%) PO SOLN
40.0000 meq | Freq: Once | ORAL | Status: AC
  Administered 2016-04-22: 40 meq via ORAL
  Filled 2016-04-22: qty 30

## 2016-04-22 NOTE — Progress Notes (Signed)
PROGRESS NOTE        [name] Age: 56 y.o. Sex: female Date of Birth: 09/26/1960 Admit Date: 04/20/2016 Admitting Physician Clydie Braun, MD ZOX:[id number] Tenny Craw, MD  Brief Narrative:  Patient is a 56 y.o. female history of multiple sclerosis on Tecfidera, recent history of UTI completed Macrobid this past Wednesday-presented to the hospital with several days history of  lower abdominal pain, and generalized weakness. Apparently her generalized weakness worsened over the past few days to the extent that she could no longer walk. She complains of mostly right lower extremity and some right upper extremity weakness. In the ED, she was found to have sepsis secondary to UTI, acute renal failure-a CT scan demonstrated bilateral hydronephrosis. A Foley catheter was placed, she was started on empiric Rocephin and admitted for further evaluation and treatment.  Subjective:  Patient in bed, denies any fever or chills, no headache chest pain, mild right lower quadrant abdominal pain, denies any new weakness.  Assessment/Plan: Sepsis secondary to UTI: Sepsis pathophysiology has resolved, continue Rocephin , clinically improved, cultures inconclusive.  Bilateral hydronephrosis: Apparently saw a urologist 2 weeks back-currently Foley catheter in urology following, most likely outpatient follow-up, discussed with urologist on 04/22/2016.   Mild hematuria: Due to combination of Foley catheter and UTI. Urology on board.  Acute renal failure: Most likely due to dehydration and sepsis, improved with hydration.  Hypokalemia: Repleted and will recheck.  Right lower>Right Upper Ext weakness: She does have advanced MS, likely weakness exacerbated by her recent infection, previous M.D. discussed with neurologist on call. MRI T-spine does show progression of her MS in the T-spine area. She has close follow-up with Dr. Sherol Dade at [facility] neurology will have  her follow with him post discharge. For now PT and most likely SNF/CIR placement.  History of multiple sclerosis: See above discussion-continue dimethyl fumarate.  Anxiety/dizziness: Continue Valium as needed  DVT Prophylaxis: SCD's-hold chemical prophylaxis given hematuria    Code Status: Full code   Family Communication: Spouse & daughter at bedside-later during the day.  Disposition Plan: Remain inpatient-but will plan on Home health vs SNF on discharge, requires several more days of hospitalization  Antimicrobial agents: Anti-infectives    Start     Dose/Rate Route Frequency Ordered Stop   04/21/16 2200  cefTRIAXone (ROCEPHIN) 1 g in dextrose 5 % 50 mL IVPB     1 g 100 mL/hr over 30 Minutes Intravenous Every 24 hours 04/21/16 0104     04/20/16 2045  cefTRIAXone (ROCEPHIN) 1 g in dextrose 5 % 50 mL IVPB     1 g 100 mL/hr over 30 Minutes Intravenous  Once 04/20/16 2038 04/20/16 2133      Procedures: None  CONSULTS:  urology  Time spent: 25- minutes-Greater than 50% of this time was spent in counseling, explanation of diagnosis, planning of further management, and coordination of care.  MEDICATIONS: Scheduled Meds: . cefTRIAXone (ROCEPHIN)  IV  1 g Intravenous Q24H  . Dimethyl Fumarate  1 capsule Oral BID  . feeding supplement (ENSURE ENLIVE)  237 mL Oral BID BM  . Influenza vac split quadrivalent PF  0.5 mL Intramuscular Tomorrow-1000  . mouth rinse  15 mL Mouth Rinse BID   Continuous Infusions: . sodium chloride     PRN Meds:.acetaminophen **OR** acetaminophen, albuterol, diazepam, dimenhyDRINATE, HYDROcodone-acetaminophen, morphine injection, ondansetron **OR** ondansetron (  ZOFRAN) IV   PHYSICAL EXAM: Vital signs: Vitals:   04/21/16 0622 04/21/16 1509 04/21/16 2223 04/22/16 0622  BP: 139/77 (!) 159/87 (!) 143/80 (!) 141/83  Pulse: (!) 106 (!) 110 (!) 119 (!) 101  Resp: 18  18 18   Temp: 97.9 F (36.6 C) 98.3 F (36.8 C) 98.4 F (36.9 C) 98.3 F (36.8  C)  TempSrc: Oral Oral Oral Oral  SpO2: 100% 100% 98% 97%  Weight:      Height:       Filed Weights   04/21/16 0039  Weight: 54.1 kg (119 lb 4.3 oz)   Body mass index is 19.85 kg/m.   General appearance :Awake, alert, not in any distress. Speech Clear.Looks chronically ill appearing. Eyes:, pupils equally reactive to light and accomodation,no scleral icterus. HEENT: Atraumatic and Normocephalic Neck: supple, no JVD. No cervical lymphadenopathy.  Resp:Good air entry bilaterally, no added sounds  CVS: S1 S2 regular, slightly tachycardic GI: Bowel sounds present, mildly tender in the pelvic area without any peritoneal signs. Foley in place Extremities: B/L Lower Ext shows no edema, both legs are warm to touch Neurology:  speech is slow but clear-she has disproportionately more right-sided weakness than her left. Right lower extremity around 2-3/5, right upper extremity around3-4/5 Musculoskeletal:No digital cyanosis Skin:No Rash, warm and dry Wounds:N/A  I have personally reviewed following labs and imaging studies  LABORATORY DATA: CBC:  Recent Labs Lab 04/20/16 1845 04/21/16 0217 04/22/16 0510  WBC 21.3* 16.1* 14.6*  HGB 13.5 10.8* 11.0*  HCT 40.2 33.5* 34.2*  MCV 85.9 86.1 86.6  PLT 695* 547* 554*    Basic Metabolic Panel:  Recent Labs Lab 04/20/16 1845 04/21/16 0217 04/22/16 0510  NA 140 143 149*  K 3.4* 3.2* 3.4*  CL 108 116* 120*  CO2 15* 14* 20*  GLUCOSE 137* 112* 109*  BUN 39* 30* 15  CREATININE 2.12* 1.43* 0.75  CALCIUM 10.3 8.3* 8.7*    GFR: Estimated Creatinine Clearance: 69.5 mL/min (by C-G formula based on SCr of 0.75 mg/dL).  Liver Function Tests:  Recent Labs Lab 04/20/16 1845  AST 13*  ALT 9*  ALKPHOS 110  BILITOT 0.6  PROT 8.9*  ALBUMIN 3.8   No results for input(s): LIPASE, AMYLASE in the last 168 hours. No results for input(s): AMMONIA in the last 168 hours.  Coagulation Profile: No results for input(s): INR, PROTIME in  the last 168 hours.  Cardiac Enzymes: No results for input(s): CKTOTAL, CKMB, CKMBINDEX, TROPONINI in the last 168 hours.  BNP (last 3 results) No results for input(s): PROBNP in the last 8760 hours.  HbA1C: No results for input(s): HGBA1C in the last 72 hours.  CBG:  Recent Labs Lab 04/22/16 0852  GLUCAP 81    Lipid Profile: No results for input(s): CHOL, HDL, LDLCALC, TRIG, CHOLHDL, LDLDIRECT in the last 72 hours.  Thyroid Function Tests: No results for input(s): TSH, T4TOTAL, FREET4, T3FREE, THYROIDAB in the last 72 hours.  Anemia Panel: No results for input(s): VITAMINB12, FOLATE, FERRITIN, TIBC, IRON, RETICCTPCT in the last 72 hours.  Urine analysis:    Component Value Date/Time   COLORURINE STRAW (A) 04/20/2016 2117   APPEARANCEUR TURBID (A) 04/20/2016 2117   LABSPEC 1.013 04/20/2016 2117   PHURINE 5.0 04/20/2016 2117   GLUCOSEU NEGATIVE 04/20/2016 2117   HGBUR LARGE (A) 04/20/2016 2117   BILIRUBINUR NEGATIVE 04/20/2016 2117   KETONESUR 5 (A) 04/20/2016 2117   PROTEINUR 100 (A) 04/20/2016 2117   NITRITE NEGATIVE 04/20/2016 2117  LEUKOCYTESUR SMALL (A) 04/20/2016 2117    Sepsis Labs: Lactic Acid, Venous    Component Value Date/Time   LATICACIDVEN 0.8 04/21/2016 0558    MICROBIOLOGY: Recent Results (from the past 240 hour(s))  Blood Culture (routine x 2)     Status: None (Preliminary result)   Collection Time: 04/20/16  8:50 PM  Result Value Ref Range Status   Specimen Description BLOOD RIGHT ANTECUBITAL  Final   Special Requests BOTTLES DRAWN AEROBIC AND ANAEROBIC 5CC  Final   Culture NO GROWTH < 24 HOURS  Final   Report Status PENDING  Incomplete  Blood Culture (routine x 2)     Status: None (Preliminary result)   Collection Time: 04/20/16  8:55 PM  Result Value Ref Range Status   Specimen Description BLOOD LEFT ANTECUBITAL  Final   Special Requests AEROBIC BOTTLE ONLY 5CC  Final   Culture NO GROWTH < 24 HOURS  Final   Report Status PENDING   Incomplete  Culture, Urine     Status: Abnormal (Preliminary result)   Collection Time: 04/20/16  9:17 PM  Result Value Ref Range Status   Specimen Description URINE, CATHETERIZED  Final   Special Requests NONE  Final   Culture (A)  Final    50,000 COLONIES/mL STAPHYLOCOCCUS AUREUS SUSCEPTIBILITIES TO FOLLOW    Report Status PENDING  Incomplete    RADIOLOGY STUDIES/RESULTS: Ct Abdomen Pelvis Wo Contrast  Result Date: 04/20/2016 CLINICAL DATA:  Weakness and nausea beginning earlier this week and worsening. Patient fell today. Nausea, body aches, and abdominal pain. History of multiple sclerosis. EXAM: CT ABDOMEN AND PELVIS WITHOUT CONTRAST TECHNIQUE: Multidetector CT imaging of the abdomen and pelvis was performed following the standard protocol without IV contrast. COMPARISON:  None. FINDINGS: Lower chest: Lung bases are clear.  Small esophageal hiatal hernia. Hepatobiliary: No focal liver abnormality is seen. No gallstones, gallbladder wall thickening, or biliary dilatation. Pancreas: Unremarkable. No pancreatic ductal dilatation or surrounding inflammatory changes. Spleen: Normal in size without focal abnormality. Adrenals/Urinary Tract: No adrenal gland nodules. Kidneys are symmetrical. There is bilateral hydronephrosis and hydroureter. No stones or obstructing lesions demonstrated. Changes may be due to reflux or pyelonephritis. Bladder wall is diffusely thickened which may indicate cystitis. Moderate distention of the bladder. Asymmetric nodular thickening at the dome of the bladder with surrounding infiltration could represent a bladder diverticulum, hemorrhage, or eccentric lesion. Stomach/Bowel: Stomach, small bowel, and colon are mostly decompressed. No inflammatory wall thickening is appreciated although under distention may obscure inflammatory changes. Appendix is normal. Vascular/Lymphatic: No significant vascular findings are present. No enlarged abdominal or pelvic lymph nodes.  Reproductive: Uterus is anteverted with nodular enlargement suggesting fibroids. Ovaries are not enlarged. Other: No free air or free fluid in the abdomen. Abdominal wall musculature appears intact. Musculoskeletal: In the uppermost images, at the T10-T11 interspace level, there is increased density material causing anterior effacement of the thecal sac and displacement of the thoracic cord. Material is contiguous with the disc space. This is incompletely included within the field of view. This could represent calcified herniated disc material or could represent extruded cement from kyphoplasty if the patient has had this procedure done. No prior comparison studies of this area are available to indicate wound this represents acute or chronic process. Consider MRI for further evaluation of the thoracic spine. IMPRESSION: Bilateral hydronephrosis and hydroureter to the bladder. No obstructing stone or lesion identified. This could represent reflux or pyelonephritis. Diffuse thickening of the bladder wall suggesting cystitis. Asymmetric nodular thickening of the  dome of the bladder with surrounding infiltration possibly representing bladder diverticulum, hemorrhage, or eccentric lesion. No evidence of bowel obstruction or inflammation. High density anterior extradural defect at T10-11, incompletely included. This could represent calcified herniated disc material or could represent extruded cement from kyphoplasty. Consider MRI for further evaluation if clinically indicated. Electronically Signed   By: Burman Nieves M.D.   On: 04/20/2016 23:45   Mr Brain Wo Contrast  Result Date: 04/21/2016 CLINICAL DATA:  56 year old female with multiple sclerosis. Presents with complaint of Generalized weakness, right upper extremity weakness. Initial encounter. EXAM: MRI HEAD WITHOUT CONTRAST TECHNIQUE: Multiplanar, multiecho pulse sequences of the brain and surrounding structures were obtained without intravenous contrast.  COMPARISON:  Brain MRI 10/29/2015 and earlier. FINDINGS: Brain: Stable cerebral volume. No restricted diffusion to suggest acute infarction. No midline shift, mass effect, evidence of mass lesion, ventriculomegaly, extra-axial collection or acute intracranial hemorrhage. Cervicomedullary junction and pituitary are within normal limits. Patchy in scattered bilateral cerebral white matter T2 and FLAIR hyperintensity which is both periventricular and subcortical appears stable since August 2017. Left greater than right temporal lobe white matter involvement again noted. Nodular lesions in the anterior frontal lobes appear stable. Subcortical but no cerebral cortical involvement identified. Stable corpus callosum. Deep gray matter nuclei appear stable. Medial and lateral right thalamic involvement appears chronic (series 7, image 13). The brainstem appears stable and is not definitely affected. Confluent chronic left posterior cerebellar encephalomalacia is stable. No new signal abnormality identified. No IV contrast administered. No chronic cerebral blood products. Vascular: Major intracranial vascular flow voids are stable. Skull and upper cervical spine: Grossly stable. Visualized bone marrow signal is within normal limits. Sinuses/Orbits: Orbits soft tissues appear stable and normal. Visualized paranasal sinuses and mastoids are stable and well pneumatized. Other: Visible internal auditory structures appear normal. Negative scalp soft tissues. IMPRESSION: 1. Stable noncontrast MRI appearance of demyelinating disease since August 2017. 2. No new intracranial abnormality identified. Electronically Signed   By: Odessa Fleming M.D.   On: 04/21/2016 13:18   Mr Thoracic Spine Wo Contrast  Result Date: 04/21/2016 CLINICAL DATA:  56 year old female with multiple sclerosis. Presents with complaint of Generalized weakness, right upper extremity weakness. Initial encounter. EXAM: MRI THORACIC SPINE WITHOUT CONTRAST TECHNIQUE:  Multiplanar, multisequence MR imaging of the thoracic spine was performed. No intravenous contrast was administered. COMPARISON:  Thoracic spine MRI 11/03/2015. Brain and lumbar MRIs from today reported separately FINDINGS: Limited sagittal imaging of the cervical spine is negative aside from straightening of lordosis. The numbering system on this study agrees with the lumbar MRI from today. Alignment: Stable thoracic vertebral height and alignment since 2017. Vertebrae: No marrow edema or evidence of acute osseous abnormality. Normal bone marrow signal. Cord: Increased confluence of abnormal thoracic spinal cord signal from the T3 through the T5 level (series 6, image 7). Cord involvement here various from the right hemicord at T3 - to the central and left hemi cord at T4 - to nearly holocord involvement at T5 (series 8, image 13). Progressed and near holo cord signal abnormality also at T7 (series 6, image 8). Similar progressed thoracic cord signal abnormality at T9 and T10 (series 6, images 9 and 10). Other patchy thoracic spinal cord heterogeneity, including at T8-T9 and also just above the conus at T12-L1, appears stable. No thoracic cord expansion. No IV contrast administered. Paraspinal and other soft tissues: Negative. Disc levels: Bulky chronic disc herniation at T10-T11 extending from the midline to the left appears not significantly changed (series  7, image 28 today versus series 6, image 34 previously). As before there is mild mass effect on the left hemi cord but the dorsal CSF space remains patent without definite spinal stenosis. No foraminal involvement. Vacuum phenomena in the parent disc is slightly more apparent (series 5, image 10). IMPRESSION: 1. 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 No IV contrast administered. 2. Chronic bulky T10-T11 disc herniation  has not significantly changed. No acute osseous abnormality identified. Electronically Signed   By: Odessa Fleming M.D.   On: 04/21/2016 16:59   Mr Lumbar Spine Wo Contrast  Result Date: 04/21/2016 CLINICAL DATA:  56 year old with multiple sclerosis. Generalized weakness. Recent antibiotic therapy for urinary tract infection. EXAM: MRI LUMBAR SPINE WITHOUT CONTRAST TECHNIQUE: Multiplanar, multisequence MR imaging of the lumbar spine was performed. No intravenous contrast was administered. COMPARISON:  Abdominopelvic CT 04/20/2016.  Thoracic MRI 11/03/2015. FINDINGS: Segmentation: Conventional anatomy assumed, with the last open disc space designated L5-S1. Alignment: Stable mild convex left scoliosis. The lateral alignment is normal. Vertebrae: No worrisome osseous lesion, acute fracture or pars defect. The visualized sacroiliac joints appear unremarkable. Conus medullaris: Extends to the L1-2 level and appears normal. Paraspinal and other soft tissues: No significant paraspinal findings. Persistent bilateral hydronephrosis and hydroureter as seen on recent abdominal CT. Bladder is not imaged on this study. Disc levels: Sagittal images extend from T11 through the mid sacrum. The abnormality at T10-11 seen on the abdominal CT is not imaged by this examination. Patient did have a central disc herniation with posterior osteophytes at T10-11 on prior thoracic MRI. The T11-12, T12-L1 and L1-2 disc space levels appear normal. L2-3: Mild disc desiccation, bulging and central annular fissure. No disc herniation or spinal stenosis. L3-4: Normal interspace. L4-5: Disc height and hydration are maintained. Mild bilateral facet hypertrophy. No spinal stenosis or nerve root encroachment. L5-S1: Disc height and hydration are maintained. Mild bilateral facet hypertrophy. No spinal stenosis or nerve root encroachment. IMPRESSION: 1. No acute findings demonstrated within the lumbar spine. 2. Mild disc degeneration at L2-3 without  resulting spinal stenosis or nerve root encroachment. 3. The abnormality described at T10-11 on recent abdominopelvic CT is not included on this examination of the lumbar spine. Consider further evaluation with follow-up of thoracic MRI. Patient did have a disc herniation at that level on prior thoracic study done 11/03/2015. 4. Persistent bilateral hydronephrosis and hydroureter of undetermined etiology. Electronically Signed   By: Carey Bullocks M.D.   On: 04/21/2016 13:36   Dg Chest Port 1 View  Result Date: 04/20/2016 CLINICAL DATA:  Tachycardia, weakness and nausea EXAM: PORTABLE CHEST 1 VIEW COMPARISON:  Chest radiograph 10/29/2015 FINDINGS: The lungs are well inflated. Cardiomediastinal contours are normal. No pneumothorax or pleural effusion. No focal airspace consolidation or pulmonary edema. IMPRESSION: Clear lungs. Electronically Signed   By: Deatra Robinson M.D.   On: 04/20/2016 21:45     LOS: 2 days   Leroy Sea, MD  [facility] Pager:[phone]  If 7PM-7AM, please contact night-coverage [email or web address] Password [facility] 04/22/2016, 9:39 AM

## 2016-04-22 NOTE — Progress Notes (Signed)
Assessment: 1. Bilateral hydronephrosis - she has shown progressive improvement of her creatinine which is currently down to 0.75. In addition she is not having any significant flank pain. It is of my opinion that there is no clinical indication for stent placement at this time. Her hydronephrosis can be followed up with a repeat imaging study as an outpatient.  2. Neurogenic bladder - she had a Foley catheter placed when she came into the hospital although she does not need to continue this if she does not want to. On the other hand she may want to maintain her Foley catheter until she is seen for follow-up for urodynamics which has been scheduled.   Plan: 1. She will follow-up with Dr. Mena Goes as an outpatient as previously scheduled. 2. Maintenance of Foley catheter will be dependent on the patient's desires.    Subjective: Patient reports no new complaints today. She is much more alert and engaging today.  Objective: Vital signs in last 24 hours: Temp:  [98.3 F (36.8 C)-98.4 F (36.9 C)] 98.3 F (36.8 C) (02/18 0622) Pulse Rate:  [101-119] 101 (02/18 0622) Resp:  [18] 18 (02/18 0622) BP: (141-159)/(80-87) 141/83 (02/18 0622) SpO2:  [97 %-100 %] 97 % (02/18 0622)A  Intake/Output from previous day: 02/17 0701 - 02/18 0700 In: 1270 [P.O.:220; I.V.:1000; IV Piggyback:50] Out: 1200 [Urine:1200] Intake/Output this shift: No intake/output data recorded.  Past Medical History:  Diagnosis Date  . Benign paroxysmal positional vertigo 10/12/2014  . MS (multiple sclerosis) (HCC)    diagnosed 9/16    Physical Exam:  Alert, awake and in no apparent distress. Lungs - Normal respiratory effort, chest expands symmetrically.  Abdomen - Soft, non-tender & non-distended. No CVAT.  Lab Results:  Recent Labs  04/20/16 1845 04/21/16 0217 04/22/16 0510  WBC 21.3* 16.1* 14.6*  HGB 13.5 10.8* 11.0*  HCT 40.2 33.5* 34.2*   BMET  Recent Labs  04/21/16 0217 04/22/16 0510  NA 143  149*  K 3.2* 3.4*  CL 116* 120*  CO2 14* 20*  GLUCOSE 112* 109*  BUN 30* 15  CREATININE 1.43* 0.75  CALCIUM 8.3* 8.7*   No results for input(s): LABURIN in the last 72 hours. Results for orders placed or performed during the hospital encounter of 04/20/16  Blood Culture (routine x 2)     Status: None (Preliminary result)   Collection Time: 04/20/16  8:50 PM  Result Value Ref Range Status   Specimen Description BLOOD RIGHT ANTECUBITAL  Final   Special Requests BOTTLES DRAWN AEROBIC AND ANAEROBIC 5CC  Final   Culture NO GROWTH < 24 HOURS  Final   Report Status PENDING  Incomplete  Blood Culture (routine x 2)     Status: None (Preliminary result)   Collection Time: 04/20/16  8:55 PM  Result Value Ref Range Status   Specimen Description BLOOD LEFT ANTECUBITAL  Final   Special Requests AEROBIC BOTTLE ONLY 5CC  Final   Culture NO GROWTH < 24 HOURS  Final   Report Status PENDING  Incomplete  Culture, Urine     Status: Abnormal (Preliminary result)   Collection Time: 04/20/16  9:17 PM  Result Value Ref Range Status   Specimen Description URINE, CATHETERIZED  Final   Special Requests NONE  Final   Culture (A)  Final    50,000 COLONIES/mL STAPHYLOCOCCUS AUREUS SUSCEPTIBILITIES TO FOLLOW    Report Status PENDING  Incomplete    Studies/Results: Mr Brain Wo Contrast  Result Date: 04/21/2016 CLINICAL DATA:  56 year old female with  multiple sclerosis. Presents with complaint of Generalized weakness, right upper extremity weakness. Initial encounter. EXAM: MRI HEAD WITHOUT CONTRAST TECHNIQUE: Multiplanar, multiecho pulse sequences of the brain and surrounding structures were obtained without intravenous contrast. COMPARISON:  Brain MRI 10/29/2015 and earlier. FINDINGS: Brain: Stable cerebral volume. No restricted diffusion to suggest acute infarction. No midline shift, mass effect, evidence of mass lesion, ventriculomegaly, extra-axial collection or acute intracranial hemorrhage.  Cervicomedullary junction and pituitary are within normal limits. Patchy in scattered bilateral cerebral white matter T2 and FLAIR hyperintensity which is both periventricular and subcortical appears stable since August 2017. Left greater than right temporal lobe white matter involvement again noted. Nodular lesions in the anterior frontal lobes appear stable. Subcortical but no cerebral cortical involvement identified. Stable corpus callosum. Deep gray matter nuclei appear stable. Medial and lateral right thalamic involvement appears chronic (series 7, image 13). The brainstem appears stable and is not definitely affected. Confluent chronic left posterior cerebellar encephalomalacia is stable. No new signal abnormality identified. No IV contrast administered. No chronic cerebral blood products. Vascular: Major intracranial vascular flow voids are stable. Skull and upper cervical spine: Grossly stable. Visualized bone marrow signal is within normal limits. Sinuses/Orbits: Orbits soft tissues appear stable and normal. Visualized paranasal sinuses and mastoids are stable and well pneumatized. Other: Visible internal auditory structures appear normal. Negative scalp soft tissues. IMPRESSION: 1. Stable noncontrast MRI appearance of demyelinating disease since August 2017. 2. No new intracranial abnormality identified. Electronically Signed   By: Odessa Fleming M.D.   On: 04/21/2016 13:18   Mr Thoracic Spine Wo Contrast  Result Date: 04/21/2016 CLINICAL DATA:  56 year old female with multiple sclerosis. Presents with complaint of Generalized weakness, right upper extremity weakness. Initial encounter. EXAM: MRI THORACIC SPINE WITHOUT CONTRAST TECHNIQUE: Multiplanar, multisequence MR imaging of the thoracic spine was performed. No intravenous contrast was administered. COMPARISON:  Thoracic spine MRI 11/03/2015. Brain and lumbar MRIs from today reported separately FINDINGS: Limited sagittal imaging of the cervical spine is  negative aside from straightening of lordosis. The numbering system on this study agrees with the lumbar MRI from today. Alignment: Stable thoracic vertebral height and alignment since 2017. Vertebrae: No marrow edema or evidence of acute osseous abnormality. Normal bone marrow signal. Cord: Increased confluence of abnormal thoracic spinal cord signal from the T3 through the T5 level (series 6, image 7). Cord involvement here various from the right hemicord at T3 - to the central and left hemi cord at T4 - to nearly holocord involvement at T5 (series 8, image 13). Progressed and near holo cord signal abnormality also at T7 (series 6, image 8). Similar progressed thoracic cord signal abnormality at T9 and T10 (series 6, images 9 and 10). Other patchy thoracic spinal cord heterogeneity, including at T8-T9 and also just above the conus at T12-L1, appears stable. No thoracic cord expansion. No IV contrast administered. Paraspinal and other soft tissues: Negative. Disc levels: Bulky chronic disc herniation at T10-T11 extending from the midline to the left appears not significantly changed (series 7, image 28 today versus series 6, image 34 previously). As before there is mild mass effect on the left hemi cord but the dorsal CSF space remains patent without definite spinal stenosis. No foraminal involvement. Vacuum phenomena in the parent disc is slightly more apparent (series 5, image 10). IMPRESSION: 1. 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 No IV contrast administered.  2. Chronic bulky T10-T11 disc herniation has not significantly changed. No acute osseous abnormality identified. Electronically Signed   By: Odessa Fleming M.D.   On: 04/21/2016 16:59   Mr Lumbar Spine Wo Contrast  Result Date: 04/21/2016 CLINICAL DATA:  55 year old with multiple sclerosis. Generalized weakness.  Recent antibiotic therapy for urinary tract infection. EXAM: MRI LUMBAR SPINE WITHOUT CONTRAST TECHNIQUE: Multiplanar, multisequence MR imaging of the lumbar spine was performed. No intravenous contrast was administered. COMPARISON:  Abdominopelvic CT 04/20/2016.  Thoracic MRI 11/03/2015. FINDINGS: Segmentation: Conventional anatomy assumed, with the last open disc space designated L5-S1. Alignment: Stable mild convex left scoliosis. The lateral alignment is normal. Vertebrae: No worrisome osseous lesion, acute fracture or pars defect. The visualized sacroiliac joints appear unremarkable. Conus medullaris: Extends to the L1-2 level and appears normal. Paraspinal and other soft tissues: No significant paraspinal findings. Persistent bilateral hydronephrosis and hydroureter as seen on recent abdominal CT. Bladder is not imaged on this study. Disc levels: Sagittal images extend from T11 through the mid sacrum. The abnormality at T10-11 seen on the abdominal CT is not imaged by this examination. Patient did have a central disc herniation with posterior osteophytes at T10-11 on prior thoracic MRI. The T11-12, T12-L1 and L1-2 disc space levels appear normal. L2-3: Mild disc desiccation, bulging and central annular fissure. No disc herniation or spinal stenosis. L3-4: Normal interspace. L4-5: Disc height and hydration are maintained. Mild bilateral facet hypertrophy. No spinal stenosis or nerve root encroachment. L5-S1: Disc height and hydration are maintained. Mild bilateral facet hypertrophy. No spinal stenosis or nerve root encroachment. IMPRESSION: 1. No acute findings demonstrated within the lumbar spine. 2. Mild disc degeneration at L2-3 without resulting spinal stenosis or nerve root encroachment. 3. The abnormality described at T10-11 on recent abdominopelvic CT is not included on this examination of the lumbar spine. Consider further evaluation with follow-up of thoracic MRI. Patient did have a disc herniation at  that level on prior thoracic study done 11/03/2015. 4. Persistent bilateral hydronephrosis and hydroureter of undetermined etiology. Electronically Signed   By: Carey Bullocks M.D.   On: 04/21/2016 13:36      Damarcus Reggio C 04/22/2016, 9:44 AM

## 2016-04-23 LAB — BASIC METABOLIC PANEL
Anion gap: 5 (ref 5–15)
BUN: 9 mg/dL (ref 6–20)
CO2: 25 mmol/L (ref 22–32)
Calcium: 8.4 mg/dL — ABNORMAL LOW (ref 8.9–10.3)
Chloride: 107 mmol/L (ref 101–111)
Creatinine, Ser: 0.6 mg/dL (ref 0.44–1.00)
GFR calc Af Amer: >60 mL/min (ref >60)
GFR calc non Af Amer: >60 mL/min (ref >60)
Glucose, Bld: 99 mg/dL (ref 65–99)
Potassium: 3.7 mmol/L (ref 3.5–5.1)
Sodium: 137 mmol/L (ref 135–145)

## 2016-04-23 LAB — URINE CULTURE: Culture: 50000 — AB

## 2016-04-23 LAB — CBC
HCT: 30.3 % — ABNORMAL LOW (ref 36.0–46.0)
Hemoglobin: 9.8 g/dL — ABNORMAL LOW (ref 12.0–15.0)
MCH: 28 pg (ref 26.0–34.0)
MCHC: 32.3 g/dL (ref 30.0–36.0)
MCV: 86.6 fL (ref 78.0–100.0)
Platelets: 471 10*3/uL — ABNORMAL HIGH (ref 150–400)
RBC: 3.5 MIL/uL — ABNORMAL LOW (ref 3.87–5.11)
RDW: 15.9 % — ABNORMAL HIGH (ref 11.5–15.5)
WBC: 13.8 10*3/uL — ABNORMAL HIGH (ref 4.0–10.5)

## 2016-04-23 NOTE — Evaluation (Signed)
Occupational Therapy Evaluation [name] MRN: [id number] DOB: Oct 26, 1960 Today's Date: 04/23/2016    History of Present Illness 56 y.o. female with medical history significant of multiple sclerosis; who presented with complaints of generalized weakness. Pt admitted with sepsis due to UTI.    Clinical Impression   PT admitted with sepsis s/p UTI. Pt currently with functional limitiations due to the deficits listed below (see OT problem list). PTA was independent with all adls.  Pt will benefit from skilled OT to increase their independence and safety with adls and balance to allow discharge CIR. Pt with overall weakness noted and requires (A) with basic transfer.      Follow Up Recommendations  CIR    Equipment Recommendations  Other (comment) (defer to next venue)    Recommendations for Other Services Rehab consult     Precautions / Restrictions Precautions Precautions: Fall Restrictions Weight Bearing Restrictions: No      Mobility Bed Mobility Overal bed mobility: Needs Assistance Bed Mobility: Supine to Sit     Supine to sit: Max assist;+2 for physical assistance     General bed mobility comments: Pt required assist to advance B LEs to edge of bed.  Pt then required assistance to elevate trunk.  Pt with minimal trace muscle contractions in efforts to advance to edge of bed.  OT used chux pad to advance patient to edge of bed in prep for transfer to chair.    Transfers Overall transfer level: Needs assistance Equipment used: Rolling walker (2 wheeled) Transfers: Squat Pivot Transfers     Squat pivot transfers: Max assist;+2 physical assistance     General transfer comment: Pt required blocking of B knees with use of chux pad to transfer from bed to chair via squat/stand pivot.  Pt performed with +2 external assist.        Balance Overall balance assessment: Needs assistance Sitting-balance support: Single extremity  supported;Feet supported Sitting balance-Leahy Scale: Poor     Standing balance support: During functional activity;Single extremity supported Standing balance-Leahy Scale: Zero                              ADL Overall ADL's : Needs assistance/impaired Eating/Feeding: Set up;Sitting   Grooming: Wash/dry face;Minimal assistance;Sitting   Upper Body Bathing: Moderate assistance   Lower Body Bathing: Maximal assistance   Upper Body Dressing : Moderate assistance   Lower Body Dressing: Maximal assistance   Toilet Transfer: +2 for physical assistance;Maximal assistance             General ADL Comments: pt required total +2 and demonstrates decr trunk control at EOB. pt with anterior LOB. Pt with BIL UE on bed surface (A)ing     Vision         Perception     Praxis      Pertinent Vitals/Pain Pain Assessment: No/denies pain     Hand Dominance Right   Extremity/Trunk Assessment Upper Extremity Assessment Upper Extremity Assessment: RUE deficits/detail RUE Deficits / Details: generalized weakness shoulder flexion 90 degrees unable to sustain due to fatigue   Lower Extremity Assessment Lower Extremity Assessment: Defer to PT evaluation   Cervical / Trunk Assessment Cervical / Trunk Assessment: Kyphotic   Communication Communication Communication: No difficulties   Cognition Arousal/Alertness: Awake/alert Behavior During Therapy: WFL for tasks assessed/performed Overall Cognitive Status: Within Functional Limits for tasks assessed  General Comments       Exercises       Shoulder Instructions      Home Living Family/patient expects to be discharged to:: Private residence Living Arrangements: Spouse/significant other Available Help at Discharge: Family;Available 24 hours/day (works from home) Type of Home: House Home Access: Level entry     Home Layout: Two level;Able to live on main level with  bedroom/bathroom     Bathroom Shower/Tub: Producer, television/film/video: Handicapped height Bathroom Accessibility: Yes   Home Equipment: Cane - single point;Shower seat;Walker - 2 wheels;Grab bars - tub/shower          Prior Functioning/Environment Level of Independence: Independent with assistive device(s)        Comments: Independent household ambulator with RW. Independent with ADLs.        OT Problem List: Decreased strength;Decreased activity tolerance;Impaired balance (sitting and/or standing);Decreased safety awareness;Decreased knowledge of use of DME or AE;Decreased knowledge of precautions;Impaired UE functional use      OT Treatment/Interventions: Self-care/ADL training;Therapeutic exercise;Neuromuscular education;DME and/or AE instruction;Therapeutic activities;Patient/family education;Balance training    OT Goals(Current goals can be found in the care plan section) Acute Rehab OT Goals Patient Stated Goal: home OT Goal Formulation: With patient/family Time For Goal Achievement: 05/07/16 Potential to Achieve Goals: Good  OT Frequency: Min 2X/week   Barriers to D/C:            Co-evaluation PT/OT/SLP Co-Evaluation/Treatment: Yes Reason for Co-Treatment: Complexity of the patient's impairments (multi-system involvement);Necessary to address cognition/behavior during functional activity;For patient/therapist safety;To address functional/ADL transfers PT goals addressed during session: Mobility/safety with mobility OT goals addressed during session: ADL's and self-care;Proper use of Adaptive equipment and DME;Strengthening/ROM      End of Session Equipment Utilized During Treatment: Gait belt Nurse Communication: Mobility status;Precautions  Activity Tolerance: Patient tolerated treatment well Patient left: in chair;with call bell/phone within reach;with chair alarm set  OT Visit Diagnosis: Muscle weakness (generalized) (M62.81)                ADL  either performed or assessed with clinical judgement  Time: 1400-1422 OT Time Calculation (min): 22 min Charges:  OT General Charges $OT Visit: 1 Procedure OT Evaluation $OT Eval High Complexity: 1 Procedure G-Codes:      Mateo Flow   OTR/L Pager: [phone] Office: [phone] .   Boone Master B 04/23/2016, 3:23 PM

## 2016-04-23 NOTE — Progress Notes (Signed)
Assessment/Plan:  Hydronephrosis - likely from neurogenic bladder/urinary retention. Cr has normalized to 0.6.   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 When I saw her in office on 1/31, her postvoid was 500 mL.  I was concerned about hydronephrosis and ordered an ultrasound and urodynamics.  Unfortunately, she decompensated sooner and presented with hydronephrosis and a UTI on this admission. She is at high risk for recurrence and I recommend keeping the Foley catheter.  Ultimately she'll need to learn CIC.  I discussed with the patient and her family the nature of risks benefits and alternatives to a Foley catheter including CIC or no catheter.  They understand colonization is normal and there is a risk of UTI, but she is at higher risk for serious UTIs without the catheter. They elect to proceed with foley catheter placement. CIC would be a good skill to be taught in rehab if that's something they can teach.   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 F/u for UDS as planned - on chart  Appreciate excellent hospitalist care.    Subjective: Patient without specific complaint.   Objective: Vital signs in last 24 hours: Temp:  [98 F (36.7 C)-98.9 F (37.2 C)] 98 F (36.7 C) (02/19 0514) Pulse Rate:  [90-115] 90 (02/19 0514) Resp:  [16-18] 18 (02/19 0514) BP: (121-148)/(71-82) 148/82 (02/19 0514) SpO2:  [92 %-100 %] 100 % (02/19 0514)  Intake/Output from previous day: 02/18 0701 - 02/19 0700 In: 1671.3 [P.O.:340; I.V.:1281.3; IV Piggyback:50] Out: 2750 [Urine:2750] Intake/Output this shift: Total I/O In: -  Out: 800 [Urine:800]  Physical Exam:  NAD Slow movements In bed Alert and Oriented   Lab Results:  Recent Labs  04/21/16 0217 04/22/16 0510 04/23/16 0542  HGB 10.8* 11.0* 9.8*  HCT 33.5* 34.2* 30.3*   BMET  Recent Labs  04/22/16 0510 04/23/16 0542  NA 149* 137   K 3.4* 3.7  CL 120* 107  CO2 20* 25  GLUCOSE 109* 99  BUN 15 9  CREATININE 0.75 0.60  CALCIUM 8.7* 8.4*   No results for input(s): LABPT, INR in the last 72 hours. No results for input(s): LABURIN in the last 72 hours. Results for orders placed or performed during the hospital encounter of 04/20/16  Blood Culture (routine x 2)     Status: None (Preliminary result)   Collection Time: 04/20/16  8:50 PM  Result Value Ref Range Status   Specimen Description BLOOD RIGHT ANTECUBITAL  Final   Special Requests BOTTLES DRAWN AEROBIC AND ANAEROBIC 5CC  Final   Culture NO GROWTH 2 DAYS  Final   Report Status PENDING  Incomplete  Blood Culture (routine x 2)     Status: None (Preliminary result)   Collection Time: 04/20/16  8:55 PM  Result Value Ref Range Status   Specimen Description BLOOD LEFT ANTECUBITAL  Final   Special Requests AEROBIC BOTTLE ONLY 5CC  Final   Culture NO GROWTH 2 DAYS  Final   Report Status PENDING  Incomplete  Culture, Urine     Status: Abnormal   Collection Time: 04/20/16  9:17 PM  Result Value Ref Range Status   Specimen Description URINE, CATHETERIZED  Final   Special Requests NONE  Final   Culture (A)  Final    50,000 COLONIES/mL METHICILLIN RESISTANT STAPHYLOCOCCUS AUREUS   Report Status 04/23/2016 FINAL  Final   Organism ID, Bacteria METHICILLIN RESISTANT STAPHYLOCOCCUS  AUREUS (A)  Final      Susceptibility   Methicillin resistant staphylococcus aureus - MIC*    CIPROFLOXACIN >=8 RESISTANT Resistant     GENTAMICIN <=0.5 SENSITIVE Sensitive     NITROFURANTOIN <=16 SENSITIVE Sensitive     OXACILLIN >=4 RESISTANT Resistant     TETRACYCLINE <=1 SENSITIVE Sensitive     VANCOMYCIN 1 SENSITIVE Sensitive     TRIMETH/SULFA >=320 RESISTANT Resistant     CLINDAMYCIN <=0.25 SENSITIVE Sensitive     RIFAMPIN <=0.5 SENSITIVE Sensitive     Inducible Clindamycin NEGATIVE Sensitive     * 50,000 COLONIES/mL METHICILLIN RESISTANT STAPHYLOCOCCUS AUREUS     Studies/Results: Mr Brain Wo Contrast  Result Date: 04/21/2016 CLINICAL DATA:  56 year old female with multiple sclerosis. Presents with complaint of Generalized weakness, right upper extremity weakness. Initial encounter. EXAM: MRI HEAD WITHOUT CONTRAST TECHNIQUE: Multiplanar, multiecho pulse sequences of the brain and surrounding structures were obtained without intravenous contrast. COMPARISON:  Brain MRI 10/29/2015 and earlier. FINDINGS: Brain: Stable cerebral volume. No restricted diffusion to suggest acute infarction. No midline shift, mass effect, evidence of mass lesion, ventriculomegaly, extra-axial collection or acute intracranial hemorrhage. Cervicomedullary junction and pituitary are within normal limits. Patchy in scattered bilateral cerebral white matter T2 and FLAIR hyperintensity which is both periventricular and subcortical appears stable since August 2017. Left greater than right temporal lobe white matter involvement again noted. Nodular lesions in the anterior frontal lobes appear stable. Subcortical but no cerebral cortical involvement identified. Stable corpus callosum. Deep gray matter nuclei appear stable. Medial and lateral right thalamic involvement appears chronic (series 7, image 13). The brainstem appears stable and is not definitely affected. Confluent chronic left posterior cerebellar encephalomalacia is stable. No new signal abnormality identified. No IV contrast administered. No chronic cerebral blood products. Vascular: Major intracranial vascular flow voids are stable. Skull and upper cervical spine: Grossly stable. Visualized bone marrow signal is within normal limits. Sinuses/Orbits: Orbits soft tissues appear stable and normal. Visualized paranasal sinuses and mastoids are stable and well pneumatized. Other: Visible internal auditory structures appear normal. Negative scalp soft tissues. IMPRESSION: 1. Stable noncontrast MRI appearance of demyelinating disease since  August 2017. 2. No new intracranial abnormality identified. Electronically Signed   By: Odessa Fleming M.D.   On: 04/21/2016 13:18   Mr Thoracic Spine Wo Contrast  Result Date: 04/21/2016 CLINICAL DATA:  56 year old female with multiple sclerosis. Presents with complaint of Generalized weakness, right upper extremity weakness. Initial encounter. EXAM: MRI THORACIC SPINE WITHOUT CONTRAST TECHNIQUE: Multiplanar, multisequence MR imaging of the thoracic spine was performed. No intravenous contrast was administered. COMPARISON:  Thoracic spine MRI 11/03/2015. Brain and lumbar MRIs from today reported separately FINDINGS: Limited sagittal imaging of the cervical spine is negative aside from straightening of lordosis. The numbering system on this study agrees with the lumbar MRI from today. Alignment: Stable thoracic vertebral height and alignment since 2017. Vertebrae: No marrow edema or evidence of acute osseous abnormality. Normal bone marrow signal. Cord: Increased confluence of abnormal thoracic spinal cord signal from the T3 through the T5 level (series 6, image 7). Cord involvement here various from the right hemicord at T3 - to the central and left hemi cord at T4 - to nearly holocord involvement at T5 (series 8, image 13). Progressed and near holo cord signal abnormality also at T7 (series 6, image 8). Similar progressed thoracic cord signal abnormality at T9 and T10 (series 6, images 9 and 10). Other patchy thoracic spinal cord heterogeneity, including at T8-T9 and  also just above the conus at T12-L1, appears stable. No thoracic cord expansion. No IV contrast administered. Paraspinal and other soft tissues: Negative. Disc levels: Bulky chronic disc herniation at T10-T11 extending from the midline to the left appears not significantly changed (series 7, image 28 today versus series 6, image 34 previously). As before there is mild mass effect on the left hemi cord but the dorsal CSF space remains patent without  definite spinal stenosis. No foraminal involvement. Vacuum phenomena in the parent disc is slightly more apparent (series 5, image 10). IMPRESSION: 1. 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 No IV contrast administered. 2. Chronic bulky T10-T11 disc herniation has not significantly changed. No acute osseous abnormality identified. Electronically Signed   By: Odessa Fleming M.D.   On: 04/21/2016 16:59   Mr Lumbar Spine Wo Contrast  Result Date: 04/21/2016 CLINICAL DATA:  56 year old with multiple sclerosis. Generalized weakness. Recent antibiotic therapy for urinary tract infection. EXAM: MRI LUMBAR SPINE WITHOUT CONTRAST TECHNIQUE: Multiplanar, multisequence MR imaging of the lumbar spine was performed. No intravenous contrast was administered. COMPARISON:  Abdominopelvic CT 04/20/2016.  Thoracic MRI 11/03/2015. FINDINGS: Segmentation: Conventional anatomy assumed, with the last open disc space designated L5-S1. Alignment: Stable mild convex left scoliosis. The lateral alignment is normal. Vertebrae: No worrisome osseous lesion, acute fracture or pars defect. The visualized sacroiliac joints appear unremarkable. Conus medullaris: Extends to the L1-2 level and appears normal. Paraspinal and other soft tissues: No significant paraspinal findings. Persistent bilateral hydronephrosis and hydroureter as seen on recent abdominal CT. Bladder is not imaged on this study. Disc levels: Sagittal images extend from T11 through the mid sacrum. The abnormality at T10-11 seen on the abdominal CT is not imaged by this examination. Patient did have a central disc herniation with posterior osteophytes at T10-11 on prior thoracic MRI. The T11-12, T12-L1 and L1-2 disc space levels appear normal. L2-3: Mild disc desiccation, bulging and central annular fissure. No disc herniation or spinal stenosis.  L3-4: Normal interspace. L4-5: Disc height and hydration are maintained. Mild bilateral facet hypertrophy. No spinal stenosis or nerve root encroachment. L5-S1: Disc height and hydration are maintained. Mild bilateral facet hypertrophy. No spinal stenosis or nerve root encroachment. IMPRESSION: 1. No acute findings demonstrated within the lumbar spine. 2. Mild disc degeneration at L2-3 without resulting spinal stenosis or nerve root encroachment. 3. The abnormality described at T10-11 on recent abdominopelvic CT is not included on this examination of the lumbar spine. Consider further evaluation with follow-up of thoracic MRI. Patient did have a disc herniation at that level on prior thoracic study done 11/03/2015. 4. Persistent bilateral hydronephrosis and hydroureter of undetermined etiology. Electronically Signed   By: Carey Bullocks M.D.   On: 04/21/2016 13:36       LOS: 3 days   Alesa Echevarria 04/23/2016, 12:03 PM

## 2016-04-23 NOTE — Progress Notes (Signed)
PROGRESS NOTE        [name] Age: 56 y.o. Sex: female Date of Birth: 01-05-1961 Admit Date: 04/20/2016 Admitting Physician Clydie Braun, MD ZOX:[id number] Tenny Craw, MD  Brief Narrative:  Patient is a 56 y.o. female history of multiple sclerosis on Tecfidera, recent history of UTI completed Macrobid this past Wednesday-presented to the hospital with several days history of  lower abdominal pain, and generalized weakness. Apparently her generalized weakness worsened over the past few days to the extent that she could no longer walk. She complains of mostly right lower extremity and some right upper extremity weakness. In the ED, she was found to have sepsis secondary to UTI, acute renal failure-a CT scan demonstrated bilateral hydronephrosis. A Foley catheter was placed, she was started on empiric Rocephin and admitted for further evaluation and treatment.  Subjective:  Patient in bed, denies any fever or chills, no headache chest pain, mild right lower quadrant abdominal pain, denies any new weakness.  Assessment/Plan:  Sepsis secondary to UTI: Sepsis pathophysiology has resolved, continue Rocephin will give total 10 days of ABX as complex UTI , clinically improved, cultures inconclusive.  Bilateral hydronephrosis: Apparently saw a urologist 2 weeks back-currently Foley catheter in urology following, most likely outpatient follow-up, discussed with urologist on 04/22/2016.   Mild hematuria: Due to combination of Foley catheter and UTI. Urology on board. Will give trial of DC foley on 04-23-16, had discussed with urologist.  Acute renal failure: Most likely due to dehydration and sepsis, improved with hydration.  Hypokalemia: Repleted and will recheck.  Right lower > Right Upper Ext weakness: She does have advanced MS, likely weakness exacerbated by her recent infection, previous M.D. discussed with neurologist on call. MRI T-spine does  show progression of her MS in the T-spine area. She has close follow-up with Dr. Sherol Dade at [facility] neurology will have her follow with him post discharge. For now PT and most likely SNF/CIR placement. Although her increased weakness on the right side since her sepsis has considerably improved and now she is close to her baseline weakness.  History of multiple sclerosis: See above discussion-continue dimethyl fumarate.  Anxiety/dizziness: Continue Valium as needed   DVT Prophylaxis: SCD's-hold chemical prophylaxis given hematuria    Code Status: Full code   Family Communication: Spouse & daughter at bedside-later during the day.  Disposition Plan:  If stable discharge to CIR/SNF on 04/24/2016   Antimicrobial agents: Anti-infectives    Start     Dose/Rate Route Frequency Ordered Stop   04/21/16 2200  cefTRIAXone (ROCEPHIN) 1 g in dextrose 5 % 50 mL IVPB     1 g 100 mL/hr over 30 Minutes Intravenous Every 24 hours 04/21/16 0104     04/20/16 2045  cefTRIAXone (ROCEPHIN) 1 g in dextrose 5 % 50 mL IVPB     1 g 100 mL/hr over 30 Minutes Intravenous  Once 04/20/16 2038 04/20/16 2133      Procedures: None  CONSULTS:  urology  Time spent: 25- minutes-Greater than 50% of this time was spent in counseling, explanation of diagnosis, planning of further management, and coordination of care.  MEDICATIONS: Scheduled Meds: . cefTRIAXone (ROCEPHIN)  IV  1 g Intravenous Q24H  . Dimethyl Fumarate  1 capsule Oral BID  . feeding supplement (ENSURE ENLIVE)  237 mL Oral BID BM  . Influenza vac split  quadrivalent PF  0.5 mL Intramuscular Tomorrow-1000  . mouth rinse  15 mL Mouth Rinse BID   Continuous Infusions: . dextrose 75 mL/hr at 04/23/16 0151   PRN Meds:.albuterol, diazepam, dimenhyDRINATE, HYDROcodone-acetaminophen, morphine injection, [DISCONTINUED] ondansetron **OR** ondansetron (ZOFRAN) IV   PHYSICAL EXAM: Vital signs: Vitals:   04/22/16 0622 04/22/16 1259 04/22/16 2144  04/23/16 0514  BP: (!) 141/83 121/74 130/71 (!) 148/82  Pulse: (!) 101 (!) 102 (!) 115 90  Resp: 18 16 18 18   Temp: 98.3 F (36.8 C) 98.1 F (36.7 C) 98.9 F (37.2 C) 98 F (36.7 C)  TempSrc: Oral Oral Oral Oral  SpO2: 97% 92% 100% 100%  Weight:      Height:       Filed Weights   04/21/16 0039  Weight: 54.1 kg (119 lb 4.3 oz)   Body mass index is 19.85 kg/m.   General appearance :Awake, alert, not in any distress. Speech Clear.Looks chronically ill appearing. Eyes:, pupils equally reactive to light and accomodation,no scleral icterus. HEENT: Atraumatic and Normocephalic Neck: supple, no JVD. No cervical lymphadenopathy.  Resp:Good air entry bilaterally, no added sounds  CVS: S1 S2 regular, slightly tachycardic GI: Bowel sounds present, mildly tender in the pelvic area without any peritoneal signs. Foley in place Extremities: B/L Lower Ext shows no edema, both legs are warm to touch Neurology:  speech is slow but clear-she has disproportionately more right-sided weakness than her left. Right lower extremity around 2-3/5, right upper extremity around3-4/5 Musculoskeletal:No digital cyanosis Skin:No Rash, warm and dry Wounds:N/A  I have personally reviewed following labs and imaging studies  LABORATORY DATA: CBC:  Recent Labs Lab 04/20/16 1845 04/21/16 0217 04/22/16 0510 04/23/16 0542  WBC 21.3* 16.1* 14.6* 13.8*  HGB 13.5 10.8* 11.0* 9.8*  HCT 40.2 33.5* 34.2* 30.3*  MCV 85.9 86.1 86.6 86.6  PLT 695* 547* 554* 471*    Basic Metabolic Panel:  Recent Labs Lab 04/20/16 1845 04/21/16 0217 04/22/16 0510 04/23/16 0542  NA 140 143 149* 137  K 3.4* 3.2* 3.4* 3.7  CL 108 116* 120* 107  CO2 15* 14* 20* 25  GLUCOSE 137* 112* 109* 99  BUN 39* 30* 15 9  CREATININE 2.12* 1.43* 0.75 0.60  CALCIUM 10.3 8.3* 8.7* 8.4*    GFR: Estimated Creatinine Clearance: 69.5 mL/min (by C-G formula based on SCr of 0.6 mg/dL).  Liver Function Tests:  Recent Labs Lab  04/20/16 1845  AST 13*  ALT 9*  ALKPHOS 110  BILITOT 0.6  PROT 8.9*  ALBUMIN 3.8   No results for input(s): LIPASE, AMYLASE in the last 168 hours. No results for input(s): AMMONIA in the last 168 hours.  Coagulation Profile: No results for input(s): INR, PROTIME in the last 168 hours.  Cardiac Enzymes: No results for input(s): CKTOTAL, CKMB, CKMBINDEX, TROPONINI in the last 168 hours.  BNP (last 3 results) No results for input(s): PROBNP in the last 8760 hours.  HbA1C: No results for input(s): HGBA1C in the last 72 hours.  CBG:  Recent Labs Lab 04/22/16 0852  GLUCAP 81    Lipid Profile: No results for input(s): CHOL, HDL, LDLCALC, TRIG, CHOLHDL, LDLDIRECT in the last 72 hours.  Thyroid Function Tests: No results for input(s): TSH, T4TOTAL, FREET4, T3FREE, THYROIDAB in the last 72 hours.  Anemia Panel: No results for input(s): VITAMINB12, FOLATE, FERRITIN, TIBC, IRON, RETICCTPCT in the last 72 hours.  Urine analysis:    Component Value Date/Time   COLORURINE STRAW (A) 04/20/2016 2117   APPEARANCEUR TURBID (  A) 04/20/2016 2117   LABSPEC 1.013 04/20/2016 2117   PHURINE 5.0 04/20/2016 2117   GLUCOSEU NEGATIVE 04/20/2016 2117   HGBUR LARGE (A) 04/20/2016 2117   BILIRUBINUR NEGATIVE 04/20/2016 2117   KETONESUR 5 (A) 04/20/2016 2117   PROTEINUR 100 (A) 04/20/2016 2117   NITRITE NEGATIVE 04/20/2016 2117   LEUKOCYTESUR SMALL (A) 04/20/2016 2117    Sepsis Labs: Lactic Acid, Venous    Component Value Date/Time   LATICACIDVEN 0.8 04/21/2016 0558    MICROBIOLOGY: Recent Results (from the past 240 hour(s))  Blood Culture (routine x 2)     Status: None (Preliminary result)   Collection Time: 04/20/16  8:50 PM  Result Value Ref Range Status   Specimen Description BLOOD RIGHT ANTECUBITAL  Final   Special Requests BOTTLES DRAWN AEROBIC AND ANAEROBIC 5CC  Final   Culture NO GROWTH 2 DAYS  Final   Report Status PENDING  Incomplete  Blood Culture (routine x 2)      Status: None (Preliminary result)   Collection Time: 04/20/16  8:55 PM  Result Value Ref Range Status   Specimen Description BLOOD LEFT ANTECUBITAL  Final   Special Requests AEROBIC BOTTLE ONLY 5CC  Final   Culture NO GROWTH 2 DAYS  Final   Report Status PENDING  Incomplete  Culture, Urine     Status: Abnormal   Collection Time: 04/20/16  9:17 PM  Result Value Ref Range Status   Specimen Description URINE, CATHETERIZED  Final   Special Requests NONE  Final   Culture (A)  Final    50,000 COLONIES/mL METHICILLIN RESISTANT STAPHYLOCOCCUS AUREUS   Report Status 04/23/2016 FINAL  Final   Organism ID, Bacteria METHICILLIN RESISTANT STAPHYLOCOCCUS AUREUS (A)  Final      Susceptibility   Methicillin resistant staphylococcus aureus - MIC*    CIPROFLOXACIN >=8 RESISTANT Resistant     GENTAMICIN <=0.5 SENSITIVE Sensitive     NITROFURANTOIN <=16 SENSITIVE Sensitive     OXACILLIN >=4 RESISTANT Resistant     TETRACYCLINE <=1 SENSITIVE Sensitive     VANCOMYCIN 1 SENSITIVE Sensitive     TRIMETH/SULFA >=320 RESISTANT Resistant     CLINDAMYCIN <=0.25 SENSITIVE Sensitive     RIFAMPIN <=0.5 SENSITIVE Sensitive     Inducible Clindamycin NEGATIVE Sensitive     * 50,000 COLONIES/mL METHICILLIN RESISTANT STAPHYLOCOCCUS AUREUS    RADIOLOGY STUDIES/RESULTS: Ct Abdomen Pelvis Wo Contrast  Result Date: 04/20/2016 CLINICAL DATA:  Weakness and nausea beginning earlier this week and worsening. Patient fell today. Nausea, body aches, and abdominal pain. History of multiple sclerosis. EXAM: CT ABDOMEN AND PELVIS WITHOUT CONTRAST TECHNIQUE: Multidetector CT imaging of the abdomen and pelvis was performed following the standard protocol without IV contrast. COMPARISON:  None. FINDINGS: Lower chest: Lung bases are clear.  Small esophageal hiatal hernia. Hepatobiliary: No focal liver abnormality is seen. No gallstones, gallbladder wall thickening, or biliary dilatation. Pancreas: Unremarkable. No pancreatic ductal  dilatation or surrounding inflammatory changes. Spleen: Normal in size without focal abnormality. Adrenals/Urinary Tract: No adrenal gland nodules. Kidneys are symmetrical. There is bilateral hydronephrosis and hydroureter. No stones or obstructing lesions demonstrated. Changes may be due to reflux or pyelonephritis. Bladder wall is diffusely thickened which may indicate cystitis. Moderate distention of the bladder. Asymmetric nodular thickening at the dome of the bladder with surrounding infiltration could represent a bladder diverticulum, hemorrhage, or eccentric lesion. Stomach/Bowel: Stomach, small bowel, and colon are mostly decompressed. No inflammatory wall thickening is appreciated although under distention may obscure inflammatory changes. Appendix is normal. Vascular/Lymphatic:  No significant vascular findings are present. No enlarged abdominal or pelvic lymph nodes. Reproductive: Uterus is anteverted with nodular enlargement suggesting fibroids. Ovaries are not enlarged. Other: No free air or free fluid in the abdomen. Abdominal wall musculature appears intact. Musculoskeletal: In the uppermost images, at the T10-T11 interspace level, there is increased density material causing anterior effacement of the thecal sac and displacement of the thoracic cord. Material is contiguous with the disc space. This is incompletely included within the field of view. This could represent calcified herniated disc material or could represent extruded cement from kyphoplasty if the patient has had this procedure done. No prior comparison studies of this area are available to indicate wound this represents acute or chronic process. Consider MRI for further evaluation of the thoracic spine. IMPRESSION: Bilateral hydronephrosis and hydroureter to the bladder. No obstructing stone or lesion identified. This could represent reflux or pyelonephritis. Diffuse thickening of the bladder wall suggesting cystitis. Asymmetric nodular  thickening of the dome of the bladder with surrounding infiltration possibly representing bladder diverticulum, hemorrhage, or eccentric lesion. No evidence of bowel obstruction or inflammation. High density anterior extradural defect at T10-11, incompletely included. This could represent calcified herniated disc material or could represent extruded cement from kyphoplasty. Consider MRI for further evaluation if clinically indicated. Electronically Signed   By: Burman Nieves M.D.   On: 04/20/2016 23:45   Mr Brain Wo Contrast  Result Date: 04/21/2016 CLINICAL DATA:  56 year old female with multiple sclerosis. Presents with complaint of Generalized weakness, right upper extremity weakness. Initial encounter. EXAM: MRI HEAD WITHOUT CONTRAST TECHNIQUE: Multiplanar, multiecho pulse sequences of the brain and surrounding structures were obtained without intravenous contrast. COMPARISON:  Brain MRI 10/29/2015 and earlier. FINDINGS: Brain: Stable cerebral volume. No restricted diffusion to suggest acute infarction. No midline shift, mass effect, evidence of mass lesion, ventriculomegaly, extra-axial collection or acute intracranial hemorrhage. Cervicomedullary junction and pituitary are within normal limits. Patchy in scattered bilateral cerebral white matter T2 and FLAIR hyperintensity which is both periventricular and subcortical appears stable since August 2017. Left greater than right temporal lobe white matter involvement again noted. Nodular lesions in the anterior frontal lobes appear stable. Subcortical but no cerebral cortical involvement identified. Stable corpus callosum. Deep gray matter nuclei appear stable. Medial and lateral right thalamic involvement appears chronic (series 7, image 13). The brainstem appears stable and is not definitely affected. Confluent chronic left posterior cerebellar encephalomalacia is stable. No new signal abnormality identified. No IV contrast administered. No chronic  cerebral blood products. Vascular: Major intracranial vascular flow voids are stable. Skull and upper cervical spine: Grossly stable. Visualized bone marrow signal is within normal limits. Sinuses/Orbits: Orbits soft tissues appear stable and normal. Visualized paranasal sinuses and mastoids are stable and well pneumatized. Other: Visible internal auditory structures appear normal. Negative scalp soft tissues. IMPRESSION: 1. Stable noncontrast MRI appearance of demyelinating disease since August 2017. 2. No new intracranial abnormality identified. Electronically Signed   By: Odessa Fleming M.D.   On: 04/21/2016 13:18   Mr Thoracic Spine Wo Contrast  Result Date: 04/21/2016 CLINICAL DATA:  56 year old female with multiple sclerosis. Presents with complaint of Generalized weakness, right upper extremity weakness. Initial encounter. EXAM: MRI THORACIC SPINE WITHOUT CONTRAST TECHNIQUE: Multiplanar, multisequence MR imaging of the thoracic spine was performed. No intravenous contrast was administered. COMPARISON:  Thoracic spine MRI 11/03/2015. Brain and lumbar MRIs from today reported separately FINDINGS: Limited sagittal imaging of the cervical spine is negative aside from straightening of lordosis. The numbering system  on this study agrees with the lumbar MRI from today. Alignment: Stable thoracic vertebral height and alignment since 2017. Vertebrae: No marrow edema or evidence of acute osseous abnormality. Normal bone marrow signal. Cord: Increased confluence of abnormal thoracic spinal cord signal from the T3 through the T5 level (series 6, image 7). Cord involvement here various from the right hemicord at T3 - to the central and left hemi cord at T4 - to nearly holocord involvement at T5 (series 8, image 13). Progressed and near holo cord signal abnormality also at T7 (series 6, image 8). Similar progressed thoracic cord signal abnormality at T9 and T10 (series 6, images 9 and 10). Other patchy thoracic spinal cord  heterogeneity, including at T8-T9 and also just above the conus at T12-L1, appears stable. No thoracic cord expansion. No IV contrast administered. Paraspinal and other soft tissues: Negative. Disc levels: Bulky chronic disc herniation at T10-T11 extending from the midline to the left appears not significantly changed (series 7, image 28 today versus series 6, image 34 previously). As before there is mild mass effect on the left hemi cord but the dorsal CSF space remains patent without definite spinal stenosis. No foraminal involvement. Vacuum phenomena in the parent disc is slightly more apparent (series 5, image 10). IMPRESSION: 1. 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 No IV contrast administered. 2. Chronic bulky T10-T11 disc herniation has not significantly changed. No acute osseous abnormality identified. Electronically Signed   By: Odessa Fleming M.D.   On: 04/21/2016 16:59   Mr Lumbar Spine Wo Contrast  Result Date: 04/21/2016 CLINICAL DATA:  56 year old with multiple sclerosis. Generalized weakness. Recent antibiotic therapy for urinary tract infection. EXAM: MRI LUMBAR SPINE WITHOUT CONTRAST TECHNIQUE: Multiplanar, multisequence MR imaging of the lumbar spine was performed. No intravenous contrast was administered. COMPARISON:  Abdominopelvic CT 04/20/2016.  Thoracic MRI 11/03/2015. FINDINGS: Segmentation: Conventional anatomy assumed, with the last open disc space designated L5-S1. Alignment: Stable mild convex left scoliosis. The lateral alignment is normal. Vertebrae: No worrisome osseous lesion, acute fracture or pars defect. The visualized sacroiliac joints appear unremarkable. Conus medullaris: Extends to the L1-2 level and appears normal. Paraspinal and other soft tissues: No significant paraspinal findings. Persistent bilateral hydronephrosis and hydroureter as  seen on recent abdominal CT. Bladder is not imaged on this study. Disc levels: Sagittal images extend from T11 through the mid sacrum. The abnormality at T10-11 seen on the abdominal CT is not imaged by this examination. Patient did have a central disc herniation with posterior osteophytes at T10-11 on prior thoracic MRI. The T11-12, T12-L1 and L1-2 disc space levels appear normal. L2-3: Mild disc desiccation, bulging and central annular fissure. No disc herniation or spinal stenosis. L3-4: Normal interspace. L4-5: Disc height and hydration are maintained. Mild bilateral facet hypertrophy. No spinal stenosis or nerve root encroachment. L5-S1: Disc height and hydration are maintained. Mild bilateral facet hypertrophy. No spinal stenosis or nerve root encroachment. IMPRESSION: 1. No acute findings demonstrated within the lumbar spine. 2. Mild disc degeneration at L2-3 without resulting spinal stenosis or nerve root encroachment. 3. The abnormality described at T10-11 on recent abdominopelvic CT is not included on this examination of the lumbar spine. Consider further evaluation with follow-up of thoracic MRI. Patient did have a disc herniation at that level on prior thoracic study done 11/03/2015. 4. Persistent bilateral hydronephrosis and hydroureter of undetermined etiology. Electronically Signed  By: Carey Bullocks M.D.   On: 04/21/2016 13:36   Dg Chest Port 1 View  Result Date: 04/20/2016 CLINICAL DATA:  Tachycardia, weakness and nausea EXAM: PORTABLE CHEST 1 VIEW COMPARISON:  Chest radiograph 10/29/2015 FINDINGS: The lungs are well inflated. Cardiomediastinal contours are normal. No pneumothorax or pleural effusion. No focal airspace consolidation or pulmonary edema. IMPRESSION: Clear lungs. Electronically Signed   By: Deatra Robinson M.D.   On: 04/20/2016 21:45     LOS: 3 days   Leroy Sea, MD  [facility] Pager:[phone]  If 7PM-7AM, please contact  night-coverage [email or web address] Password [facility] 04/23/2016, 9:43 AM

## 2016-04-23 NOTE — Progress Notes (Signed)
I have ordered OT eval for insurance authorization. [phone]

## 2016-04-23 NOTE — Clinical Social Work Note (Signed)
Clinical Social Work Assessment  Patient Details  Name: Crystal Park MRN: [id number] Date of Birth: 08/25/1960  Date of referral:  04/23/16               Reason for consult:  Facility Placement                Permission sought to share information with:  Facility Medical sales representative, Family Supports Permission granted to share information::  Yes, Verbal Permission Granted  Name::     Emergency planning/management officer::  SNFs  Relationship::  Spouse  Contact Information:  [phone]  Housing/Transportation Living arrangements for the past 2 months:  Single Family Home Source of Information:  Patient, Adult Children Patient Interpreter Needed:  None Criminal Activity/Legal Involvement Pertinent to Current Situation/Hospitalization:  No - Comment as needed Significant Relationships:  Adult Children, Spouse Lives with:  Spouse Do you feel safe going back to the place where you live?  No Need for family participation in patient care:  Yes (Comment)  Care giving concerns:  CSW received consult for possible SNF placement at time of discharge if CIR is unable to admit patient. CSW spoke with patient and her eldest daughter regarding PT recommendation of SNF placement at time of discharge. Patient reported that patient's spouse is currently unable to care for patient at their home given patient's current physical needs and fall risk. Patient expressed understanding of PT recommendation and is agreeable to SNF placement at time of discharge. CSW to continue to follow and assist with discharge planning needs.   Social Worker assessment / plan:  CSW spoke with patient concerning possibility of rehab at [facility] before returning home if CIR is unable to admit.  Employment status:  Disabled (Comment on whether or not currently receiving Disability) Insurance information:  Managed Care PT Recommendations:  Inpatient Rehab Consult Information / Referral to community resources:  Skilled Nursing  Facility  Patient/Family's Response to care:  Patient recognizes need for rehab before returning home and is agreeable to a SNF in [place]. Patient has been to [facility] before, but is not sure if she wants to go back there.  Patient/Family's Understanding of and Emotional Response to Diagnosis, Current Treatment, and Prognosis:  Patient/family is realistic regarding therapy needs and expressed being hopeful for SNF placement. Patient expressed understanding of CSW role and discharge process. No questions/concerns about plan or treatment.    Emotional Assessment Appearance:  Appears stated age Attitude/Demeanor/Rapport:  Other (Appropriate; Pleasant) Affect (typically observed):  Accepting, Appropriate Orientation:  Oriented to Self, Oriented to Situation, Oriented to Place, Oriented to  Time Alcohol / Substance use:  Not Applicable Psych involvement (Current and /or in the community):  No (Comment)  Discharge Needs  Concerns to be addressed:  Care Coordination Readmission within the last 30 days:  No Current discharge risk:  None Barriers to Discharge:  Continued Medical Work up   [employer], LCSWA 04/23/2016, 10:50 AM

## 2016-04-23 NOTE — Progress Notes (Signed)
I met with pt and her oldest daughter at bedside to discuss a possible inpt rehab admission pending [employer] approval. They prefer inpt rehab rather than SNF. Was at [facility] SNF for 5 weeks in the past. I will begin insurance authorization. [phone]

## 2016-04-23 NOTE — [place] FL2 (Signed)
[place] MEDICAID FL2 LEVEL OF CARE SCREENING TOOL     IDENTIFICATION  Patient Name: Crystal Park Birthdate: Apr 28, 1960 Sex: female Admission Date (Current Location): 04/20/2016  [facility] and [place] Number:  Producer, television/film/video and Address:  [facility] [place], [address], [place], [place] [address]      Provider Number: [id number]  Attending Physician Name and Address:  Leroy Sea, MD  Relative Name and Phone Number:  Greig Castilla, spouse, [phone]    Current Level of Care: Hospital Recommended Level of Care: Skilled Nursing Facility Prior Approval Number:    Date Approved/Denied:   PASRR Number: [id number]  Discharge Plan: SNF    Current Diagnoses: Patient Active Problem List   Diagnosis Date Noted  . Sepsis (HCC) 04/21/2016  . Bilateral hydronephrosis 04/21/2016  . ARF (acute renal failure) (HCC) 04/21/2016  . Sepsis secondary to UTI (HCC) 04/20/2016  . Attention deficit disorder 02/23/2016  . Gait disturbance 11/24/2015  . Other fatigue 11/24/2015  . Urinary urgency 11/24/2015  . Dehydration   . UTI (lower urinary tract infection) 10/29/2015  . Leukocytosis 10/29/2015  . Nausea and vomiting 10/29/2015  . MS (multiple sclerosis) (HCC) 10/26/2014    Orientation RESPIRATION BLADDER Height & Weight     Self, Time, Situation, Place  Normal Continent, Indwelling catheter Weight: 54.1 kg (119 lb 4.3 oz) Height:  5\' 5"  (165.1 cm)  BEHAVIORAL SYMPTOMS/MOOD NEUROLOGICAL BOWEL NUTRITION STATUS      Continent Diet (Please see DC Summary)  AMBULATORY STATUS COMMUNICATION OF NEEDS Skin   Extensive Assist Verbally Normal                       Personal Care Assistance Level of Assistance  Bathing, Feeding, Dressing Bathing Assistance: Maximum assistance Feeding assistance: Limited assistance Dressing Assistance: Maximum assistance     Functional Limitations Info             SPECIAL CARE FACTORS FREQUENCY  PT (By  licensed PT)     PT Frequency: 5x/week              Contractures      Additional Factors Info  Code Status, Allergies Code Status Info: Full Allergies Info: NKA           Current Medications (04/23/2016):  This is the current hospital active medication list Current Facility-Administered Medications  Medication Dose Route Frequency Provider Last Rate Last Dose  . albuterol (PROVENTIL) (2.5 MG/3ML) 0.083% nebulizer solution 2.5 mg  2.5 mg Nebulization Q2H PRN Rondell A Katrinka Blazing, MD      . cefTRIAXone (ROCEPHIN) 1 g in dextrose 5 % 50 mL IVPB  1 g Intravenous Q24H Stevphen Rochester, RPH   1 g at 04/22/16 2255  . dextrose 5 % solution   Intravenous Continuous Leroy Sea, MD 75 mL/hr at 04/23/16 0151    . diazepam (VALIUM) tablet 5 mg  5 mg Oral Q12H PRN Clydie Braun, MD   5 mg at 04/22/16 0849  . dimenhyDRINATE (DRAMAMINE) tablet 50 mg  50 mg Oral Q8H PRN Clydie Braun, MD      . Dimethyl Fumarate CPDR 240 mg  1 capsule Oral BID Clydie Braun, MD   240 mg at 04/22/16 2255  . feeding supplement (ENSURE ENLIVE) (ENSURE ENLIVE) liquid 237 mL  237 mL Oral BID BM Rondell A Katrinka Blazing, MD   237 mL at 04/22/16 1500  . HYDROcodone-acetaminophen (NORCO/VICODIN) 5-325 MG per tablet 1  tablet  1 tablet Oral Q6H PRN Clydie Braun, MD   1 tablet at 04/23/16 0151  . Influenza vac split quadrivalent PF (FLUARIX) injection 0.5 mL  0.5 mL Intramuscular Tomorrow-1000 Rondell A Katrinka Blazing, MD      . MEDLINE mouth rinse  15 mL Mouth Rinse BID Clydie Braun, MD   15 mL at 04/22/16 2311  . morphine 2 MG/ML injection 2 mg  2 mg Intravenous Q3H PRN Clydie Braun, MD   2 mg at 04/22/16 2300  . ondansetron (ZOFRAN) injection 4 mg  4 mg Intravenous Q6H PRN Clydie Braun, MD       Facility-Administered Medications Ordered in Other Encounters  Medication Dose Route Frequency Provider Last Rate Last Dose  . gadopentetate dimeglumine (MAGNEVIST) injection 11 mL  11 mL Intravenous Once PRN Asa Lente, MD       . gadopentetate dimeglumine (MAGNEVIST) injection 12 mL  12 mL Intravenous Once PRN Asa Lente, MD         Discharge Medications: Please see discharge summary for a list of discharge medications.  Relevant Imaging Results:  Relevant Lab Results:   Additional Information SS # [id number]  Mearl Latin, LCSWA

## 2016-04-23 NOTE — Progress Notes (Signed)
Physical Therapy Treatment [name] MRN: [id number] DOB: 1960/12/19 Today's Date: 04/23/2016    History of Present Illness 56 y.o. female with medical history significant of multiple sclerosis; who presented with complaints of generalized weakness. Pt admitted with sepsis due to UTI.     PT Comments    Pt performed increased activity during session but remains to fatigue quickly.  Pt only able to tolerate x5 reps of LE therapeutic exercise with AAROM.  Performed co-tx to maintain patient safety.  Will continue to recommend CIR to improve strength before returning home to private residence.      Follow Up Recommendations  CIR     Equipment Recommendations  Other (comment) (TBA at next venue.  )    Recommendations for Other Services       Precautions / Restrictions Precautions Precautions: Fall Restrictions Weight Bearing Restrictions: No    Mobility  Bed Mobility Overal bed mobility: Needs Assistance Bed Mobility: Supine to Sit     Supine to sit: Max assist;+2 for physical assistance     General bed mobility comments: Pt required assist to advance B LEs to edge of bed.  Pt then required assistance to elevate trunk.  Pt with minimal trace muscle contractions in efforts to advance to edge of bed.  OT used chux pad to advance patient to edge of bed in prep for transfer to chair.    Transfers Overall transfer level: Needs assistance Equipment used: Rolling walker (2 wheeled) Transfers: Squat Pivot Transfers     Squat pivot transfers: Max assist;+2 physical assistance     General transfer comment: Pt required blocking of B knees with use of chux pad to transfer from bed to chair via squat/stand pivot.  Pt performed with +2 external assist.      Ambulation/Gait Ambulation/Gait assistance:  (remains unable due to instability in R knee.  )           General Gait Details: unable   Stairs            Wheelchair Mobility     Modified Rankin (Stroke Patients Only)       Balance     Sitting balance-Leahy Scale: Poor       Standing balance-Leahy Scale: Zero                      Cognition Arousal/Alertness: Awake/alert Behavior During Therapy: WFL for tasks assessed/performed Overall Cognitive Status: Within Functional Limits for tasks assessed                      Exercises General Exercises - Lower Extremity Long Arc Quad: AROM;5 reps;Both;Seated Hip ABduction/ADduction: AROM;Both;5 reps;Seated Hip Flexion/Marching: AROM;Both;5 reps;Seated    General Comments        Pertinent Vitals/Pain Pain Assessment: No/denies pain    Home Living                      Prior Function            PT Goals (current goals can now be found in the care plan section) Acute Rehab PT Goals Patient Stated Goal: home Potential to Achieve Goals: Good Progress towards PT goals: Progressing toward goals    Frequency    Min 4X/week      PT Plan Current plan remains appropriate    Co-evaluation PT/OT/SLP Co-Evaluation/Treatment: Yes Reason for Co-Treatment: For patient/therapist safety PT goals addressed during session: Mobility/safety with mobility OT  goals addressed during session: ADL's and self-care     End of Session Equipment Utilized During Treatment: Gait belt Activity Tolerance: Patient limited by fatigue Patient left: in chair;with call bell/phone within reach;with family/visitor present Nurse Communication: Mobility status       Time: 1407-1430 PT Time Calculation (min) (ACUTE ONLY): 23 min  Charges:  $Therapeutic Activity: 8-22 mins                    G Codes:       Florestine Avers May 18, 2016, 2:47 PM Joycelyn Rua, PTA pager [phone]

## 2016-04-24 ENCOUNTER — Inpatient Hospital Stay (HOSPITAL_COMMUNITY): Payer: Managed Care, Other (non HMO)

## 2016-04-24 ENCOUNTER — Encounter (HOSPITAL_COMMUNITY): Payer: Self-pay

## 2016-04-24 ENCOUNTER — Inpatient Hospital Stay (HOSPITAL_COMMUNITY)
Admission: RE | Admit: 2016-04-24 | Discharge: 2016-05-05 | DRG: 052 | Disposition: A | Discharge status: Home Health | Payer: Managed Care, Other (non HMO) | Source: Intra-hospital | Attending: Physical Medicine & Rehabilitation | Admitting: Physical Medicine & Rehabilitation

## 2016-04-24 DIAGNOSIS — N390 Urinary tract infection, site not specified: Secondary | ICD-10-CM

## 2016-04-24 DIAGNOSIS — R29898 Other symptoms and signs involving the musculoskeletal system: Secondary | ICD-10-CM | POA: Diagnosis not present

## 2016-04-24 DIAGNOSIS — B9562 Methicillin resistant Staphylococcus aureus infection as the cause of diseases classified elsewhere: Secondary | ICD-10-CM | POA: Diagnosis present

## 2016-04-24 DIAGNOSIS — K5900 Constipation, unspecified: Secondary | ICD-10-CM | POA: Diagnosis not present

## 2016-04-24 DIAGNOSIS — R319 Hematuria, unspecified: Secondary | ICD-10-CM

## 2016-04-24 DIAGNOSIS — N1339 Other hydronephrosis: Secondary | ICD-10-CM | POA: Diagnosis present

## 2016-04-24 DIAGNOSIS — D62 Acute posthemorrhagic anemia: Secondary | ICD-10-CM | POA: Diagnosis present

## 2016-04-24 DIAGNOSIS — F419 Anxiety disorder, unspecified: Secondary | ICD-10-CM | POA: Diagnosis present

## 2016-04-24 DIAGNOSIS — N3289 Other specified disorders of bladder: Secondary | ICD-10-CM | POA: Diagnosis not present

## 2016-04-24 DIAGNOSIS — Z90721 Acquired absence of ovaries, unilateral: Secondary | ICD-10-CM | POA: Diagnosis not present

## 2016-04-24 DIAGNOSIS — R110 Nausea: Secondary | ICD-10-CM | POA: Diagnosis not present

## 2016-04-24 DIAGNOSIS — G4459 Other complicated headache syndrome: Secondary | ICD-10-CM

## 2016-04-24 DIAGNOSIS — K5901 Slow transit constipation: Secondary | ICD-10-CM

## 2016-04-24 DIAGNOSIS — G8250 Quadriplegia, unspecified: Secondary | ICD-10-CM | POA: Diagnosis present

## 2016-04-24 DIAGNOSIS — B379 Candidiasis, unspecified: Secondary | ICD-10-CM | POA: Diagnosis not present

## 2016-04-24 DIAGNOSIS — Z79899 Other long term (current) drug therapy: Secondary | ICD-10-CM

## 2016-04-24 DIAGNOSIS — I10 Essential (primary) hypertension: Secondary | ICD-10-CM

## 2016-04-24 DIAGNOSIS — A419 Sepsis, unspecified organism: Secondary | ICD-10-CM

## 2016-04-24 DIAGNOSIS — R112 Nausea with vomiting, unspecified: Secondary | ICD-10-CM | POA: Diagnosis not present

## 2016-04-24 DIAGNOSIS — N179 Acute kidney failure, unspecified: Secondary | ICD-10-CM | POA: Diagnosis present

## 2016-04-24 DIAGNOSIS — N319 Neuromuscular dysfunction of bladder, unspecified: Secondary | ICD-10-CM | POA: Diagnosis present

## 2016-04-24 DIAGNOSIS — M7989 Other specified soft tissue disorders: Secondary | ICD-10-CM | POA: Diagnosis not present

## 2016-04-24 DIAGNOSIS — R339 Retention of urine, unspecified: Secondary | ICD-10-CM | POA: Diagnosis present

## 2016-04-24 DIAGNOSIS — G35 Multiple sclerosis: Secondary | ICD-10-CM | POA: Diagnosis present

## 2016-04-24 LAB — GLUCOSE, CAPILLARY: Glucose-Capillary: 100 mg/dL — ABNORMAL HIGH (ref 65–99)

## 2016-04-24 MED ORDER — NITROFURANTOIN MACROCRYSTAL 100 MG PO CAPS: 100.0000 mg | ORAL_CAPSULE | Freq: Two times a day (BID) | ORAL | Status: DC

## 2016-04-24 MED ORDER — DIAZEPAM 5 MG PO TABS
5.0000 mg | ORAL_TABLET | Freq: Two times a day (BID) | ORAL | Status: DC | PRN
  Administered 2016-04-25 – 2016-05-04 (×10): 5 mg via ORAL
  Filled 2016-04-24 (×12): qty 1

## 2016-04-24 MED ORDER — CEFPODOXIME PROXETIL 200 MG PO TABS
200.0000 mg | ORAL_TABLET | Freq: Two times a day (BID) | ORAL | Status: AC
  Administered 2016-04-24 – 2016-04-28 (×8): 200 mg via ORAL
  Filled 2016-04-24 (×9): qty 1

## 2016-04-24 MED ORDER — DIMETHYL FUMARATE 240 MG PO CPDR
1.0000 | DELAYED_RELEASE_CAPSULE | Freq: Two times a day (BID) | ORAL | Status: DC
  Administered 2016-04-24 – 2016-05-05 (×22): 240 mg via ORAL
  Filled 2016-04-24 (×20): qty 1

## 2016-04-24 MED ORDER — GADOBENATE DIMEGLUMINE 529 MG/ML IV SOLN
15.0000 mL | Freq: Once | INTRAVENOUS | Status: AC
  Administered 2016-04-24: 12 mL via INTRAVENOUS

## 2016-04-24 MED ORDER — SORBITOL 70 % SOLN
30.0000 mL | Freq: Every day | Status: DC | PRN
  Administered 2016-05-03: 30 mL via ORAL
  Filled 2016-04-24: qty 30

## 2016-04-24 MED ORDER — ENSURE ENLIVE PO LIQD
237.0000 mL | Freq: Two times a day (BID) | ORAL | Status: DC
  Administered 2016-04-26 – 2016-04-30 (×8): 237 mL via ORAL

## 2016-04-24 MED ORDER — HYDROCODONE-ACETAMINOPHEN 5-325 MG PO TABS
1.0000 | ORAL_TABLET | Freq: Four times a day (QID) | ORAL | Status: DC | PRN
  Administered 2016-04-24 – 2016-05-05 (×24): 1 via ORAL
  Filled 2016-04-24 (×25): qty 1

## 2016-04-24 MED ORDER — DIAZEPAM 5 MG PO TABS: 5.0000 mg | ORAL_TABLET | Freq: Two times a day (BID) | ORAL | 0 refills | Status: DC | PRN

## 2016-04-24 MED ORDER — ONDANSETRON HCL 4 MG PO TABS
4.0000 mg | ORAL_TABLET | Freq: Four times a day (QID) | ORAL | Status: DC | PRN
  Administered 2016-04-26 – 2016-05-04 (×11): 4 mg via ORAL
  Filled 2016-04-24 (×12): qty 1

## 2016-04-24 MED ORDER — CEFPODOXIME PROXETIL 200 MG PO TABS: 200.0000 mg | ORAL_TABLET | Freq: Two times a day (BID) | ORAL | Status: DC

## 2016-04-24 MED ORDER — ALBUTEROL SULFATE (2.5 MG/3ML) 0.083% IN NEBU
2.5000 mg | INHALATION_SOLUTION | RESPIRATORY_TRACT | Status: DC | PRN
  Filled 2016-04-24: qty 3

## 2016-04-24 MED ORDER — ONDANSETRON HCL 4 MG/2ML IJ SOLN: 4.0000 mg | Freq: Four times a day (QID) | INTRAMUSCULAR | Status: DC | PRN

## 2016-04-24 MED ORDER — CEFPODOXIME PROXETIL 200 MG PO TABS
200.0000 mg | ORAL_TABLET | Freq: Two times a day (BID) | ORAL | Status: DC
  Administered 2016-04-24: 200 mg via ORAL
  Filled 2016-04-24: qty 1

## 2016-04-24 NOTE — Discharge Summary (Addendum)
Crystal Park XBJ:[id number] DOB: 1961/01/03 DOA: 04/20/2016  PCP: Duane Lope, MD  Admit date: 04/20/2016  Discharge date: 04/24/2016  Admitted From: Home   Disposition:  CIR/SNF   Recommendations for Outpatient Follow-up:   Follow up with PCP in 1-2 weeks  PCP Please obtain BMP/CBC, 2 view CXR in 1week,  (see Discharge instructions)   PCP Please follow up on the following pending results: None   Home Health: None   Equipment/Devices: None  Consultations: Urology Discharge Condition: Fair   CODE STATUS: Full  Diet Recommendation: Heart Healthy    Chief Complaint  Patient presents with  . Weakness     Brief history of present illness from the day of admission and additional interim summary    Patient is a 56 y.o. female history of multiple sclerosis on Tecfidera, recent history of UTI completed Macrobid this past Wednesday-presented to the hospital with several days history of  lower abdominal pain, and generalized weakness. Apparently her generalized weakness worsened over the past few days to the extent that she could no longer walk. She complains of mostly right lower extremity and some right upper extremity weakness. In the ED, she was found to have sepsis secondary to UTI, acute renal failure-a CT scan demonstrated bilateral hydronephrosis. A Foley catheter was placed, she was started on empiric Rocephin and admitted for further evaluation and treatment.                                                                 Hospital Course   Sepsis secondary to UTI: Sepsis pathophysiology has resolved, Received IV Rocephin here will continue 4 more days of oral Vantin for complex UTI , clinically improved, cultures inconclusive. Cleared by urology for discharge.  Bilateral hydronephrosis: Apparently saw a  urologist 2 weeks back- seen by urology here as well, continue Foley catheter per urology, follow with them in the office. Case discussed with urologist 2. Follow with urology in the office in 1-2 weeks. she will be discharged with Foley catheter.  Mild hematuria: Due to combination of Foley catheter and UTI. In by urologist, urine clearing, hematuria has almost completely resolved.  Acute renal failure: Most likely due to dehydration and sepsis, resolved with hydration.  Hypokalemia: Repleted and stable.  Right lower > Right Upper Ext weakness: She does have advanced MS, likely weakness exacerbated by her recent infection, previous M.D. discussed with neurologist on call. MRI T-spine does show progression of her MS in the T-spine area, no further workup recommended by neurology here. She has close follow-up with Dr. Sherol Dade at [facility] neurology will have her follow with him post discharge. For now PT and most likely SNF/CIR placement. Although her increased weakness on the right side since her sepsis has considerably improved and now she is close to her baseline  weakness.  Also discussed her case with neurologist on call on 04/24/2016 Dr. Roxy Manns. He recommends supportive care and outpatient follow-up with her primary neurologist in 1-2 weeks.  History of multiple sclerosis: See above discussion-continue dimethyl fumarate.  Anxiety/dizziness: Continue Valium as needed     Discharge diagnosis     Principal Problem:   Sepsis secondary to UTI [facility]) Active Problems:   MS (multiple sclerosis) (HCC)   Nausea and vomiting   Bilateral hydronephrosis   ARF (acute renal failure) [facility])    Discharge instructions    Discharge Instructions    Discharge instructions    Complete by:  As directed    Follow with Primary MD Duane Lope, MD in 7 days   Get CBC, CMP, 2 view Chest X ray checked  by Primary MD or SNF MD in 5-7 days ( we routinely change or add medications that can affect your  baseline labs and fluid status, therefore we recommend that you get the mentioned basic workup next visit with your PCP, your PCP may decide not to get them or add new tests based on their clinical decision)  Activity: As tolerated with Full fall precautions use walker/cane & assistance as needed  Disposition CIR/SNF  Diet:  Heart Healthy with feeding assistance and aspiration precautions.  For Heart failure patients - Check your Weight same time everyday, if you gain over 2 pounds, or you develop in leg swelling, experience more shortness of breath or chest pain, call your Primary MD immediately. Follow Cardiac Low Salt Diet and 1.5 lit/day fluid restriction.  On your next visit with your primary care physician please Get Medicines reviewed and adjusted.  Please request your Prim.MD to go over all Hospital Tests and Procedure/Radiological results at the follow up, please get all Hospital records sent to your Prim MD by signing hospital release before you go home.  If you experience worsening of your admission symptoms, develop shortness of breath, life threatening emergency, suicidal or homicidal thoughts you must seek medical attention immediately by calling 911 or calling your MD immediately  if symptoms less severe.  You Must read complete instructions/literature along with all the possible adverse reactions/side effects for all the Medicines you take and that have been prescribed to you. Take any new Medicines after you have completely understood and accpet all the possible adverse reactions/side effects.   Do not drive, operate heavy machinery, perform activities at heights, swimming or participation in water activities or provide baby sitting services if your were admitted for syncope or siezures until you have seen by Primary MD or a Neurologist and advised to do so again.  Do not drive when taking Pain medications.    Do not take more than prescribed Pain, Sleep and Anxiety  Medications  Special Instructions: If you have smoked or chewed Tobacco  in the last 2 yrs please stop smoking, stop any regular Alcohol  and or any Recreational drug use.  Wear Seat belts while driving.   Please note  You were cared for by a hospitalist during your hospital stay. If you have any questions about your discharge medications or the care you received while you were in the hospital after you are discharged, you can call the unit and asked to speak with the hospitalist on call if the hospitalist that took care of you is not available. Once you are discharged, your primary care physician will handle any further medical issues. Please note that NO REFILLS for any discharge medications will be authorized  once you are discharged, as it is imperative that you return to your primary care physician (or establish a relationship with a primary care physician if you do not have one) for your aftercare needs so that they can reassess your need for medications and monitor your lab values.   Increase activity slowly    Complete by:  As directed       Discharge Medications   Allergies as of 04/24/2016   No Known Allergies     Medication List    TAKE these medications   amphetamine-dextroamphetamine 15 MG 24 hr capsule Commonly known as:  ADDERALL XR Take 1 capsule by mouth every morning.   cefpodoxime 200 MG tablet Commonly known as:  VANTIN Take 1 tablet (200 mg total) by mouth every 12 (twelve) hours. 4 more days   cholecalciferol 1000 units tablet Commonly known as:  VITAMIN D Take 1,000 Units by mouth daily. otc vit. d 5,000iu daily/fim   CRANBERRY PO Take 1 capsule by mouth daily.   diazepam 5 MG tablet Commonly known as:  VALIUM Take 1 tablet (5 mg total) by mouth every 12 (twelve) hours as needed for anxiety (for dizziness also).   dimenhyDRINATE 50 MG tablet Commonly known as:  DRAMAMINE Take 50 mg by mouth every 8 (eight) hours as needed for dizziness.     nitrofurantoin 100 MG capsule Commonly known as:  MACRODANTIN Take 1 capsule (100 mg total) by mouth 2 (two) times daily. Start taking on:  04/28/2016   oxybutynin 10 MG 24 hr tablet Commonly known as:  DITROPAN-XL Take 1 tablet (10 mg total) by mouth at bedtime.   TECFIDERA 240 MG Cpdr Generic drug:  Dimethyl Fumarate TAKE 1 CAPSULE BY MOUTH TWICE DAILY       Follow-up Information    ESKRIDGE, MATTHEW, MD Follow up on 05/01/2016.   Specialty:  Urology Why:  12:45 PM  Contact information: [address] [place] [address] [phone]        Duane Lope, MD. Schedule an appointment as soon as possible for a visit in 1 week(s).   Specialty:  Family Medicine Contact information: [address] [place] [address] [phone]        SATER,RICHARD A, MD. Schedule an appointment as soon as possible for a visit in 1 week(s).   Specialty:  Neurology Contact information: [address] [place] [address] [phone]           Major procedures and Radiology Reports - PLEASE review detailed and final reports thoroughly  -       Ct Abdomen Pelvis Wo Contrast  Result Date: 04/20/2016 CLINICAL DATA:  Weakness and nausea beginning earlier this week and worsening. Patient fell today. Nausea, body aches, and abdominal pain. History of multiple sclerosis. EXAM: CT ABDOMEN AND PELVIS WITHOUT CONTRAST TECHNIQUE: Multidetector CT imaging of the abdomen and pelvis was performed following the standard protocol without IV contrast. COMPARISON:  None. FINDINGS: Lower chest: Lung bases are clear.  Small esophageal hiatal hernia. Hepatobiliary: No focal liver abnormality is seen. No gallstones, gallbladder wall thickening, or biliary dilatation. Pancreas: Unremarkable. No pancreatic ductal dilatation or surrounding inflammatory changes. Spleen: Normal in size without focal abnormality. Adrenals/Urinary Tract: No adrenal gland nodules. Kidneys are symmetrical. There is  bilateral hydronephrosis and hydroureter. No stones or obstructing lesions demonstrated. Changes may be due to reflux or pyelonephritis. Bladder wall is diffusely thickened which may indicate cystitis. Moderate distention of the bladder. Asymmetric nodular thickening at the dome of  the bladder with surrounding infiltration could represent a bladder diverticulum, hemorrhage, or eccentric lesion. Stomach/Bowel: Stomach, small bowel, and colon are mostly decompressed. No inflammatory wall thickening is appreciated although under distention may obscure inflammatory changes. Appendix is normal. Vascular/Lymphatic: No significant vascular findings are present. No enlarged abdominal or pelvic lymph nodes. Reproductive: Uterus is anteverted with nodular enlargement suggesting fibroids. Ovaries are not enlarged. Other: No free air or free fluid in the abdomen. Abdominal wall musculature appears intact. Musculoskeletal: In the uppermost images, at the T10-T11 interspace level, there is increased density material causing anterior effacement of the thecal sac and displacement of the thoracic cord. Material is contiguous with the disc space. This is incompletely included within the field of view. This could represent calcified herniated disc material or could represent extruded cement from kyphoplasty if the patient has had this procedure done. No prior comparison studies of this area are available to indicate wound this represents acute or chronic process. Consider MRI for further evaluation of the thoracic spine. IMPRESSION: Bilateral hydronephrosis and hydroureter to the bladder. No obstructing stone or lesion identified. This could represent reflux or pyelonephritis. Diffuse thickening of the bladder wall suggesting cystitis. Asymmetric nodular thickening of the dome of the bladder with surrounding infiltration possibly representing bladder diverticulum, hemorrhage, or eccentric lesion. No evidence of bowel obstruction or  inflammation. High density anterior extradural defect at T10-11, incompletely included. This could represent calcified herniated disc material or could represent extruded cement from kyphoplasty. Consider MRI for further evaluation if clinically indicated. Electronically Signed   By: Burman Nieves M.D.   On: 04/20/2016 23:45   Mr Brain Wo Contrast  Result Date: 04/21/2016 CLINICAL DATA:  57 year old female with multiple sclerosis. Presents with complaint of Generalized weakness, right upper extremity weakness. Initial encounter. EXAM: MRI HEAD WITHOUT CONTRAST TECHNIQUE: Multiplanar, multiecho pulse sequences of the brain and surrounding structures were obtained without intravenous contrast. COMPARISON:  Brain MRI 10/29/2015 and earlier. FINDINGS: Brain: Stable cerebral volume. No restricted diffusion to suggest acute infarction. No midline shift, mass effect, evidence of mass lesion, ventriculomegaly, extra-axial collection or acute intracranial hemorrhage. Cervicomedullary junction and pituitary are within normal limits. Patchy in scattered bilateral cerebral white matter T2 and FLAIR hyperintensity which is both periventricular and subcortical appears stable since August 2017. Left greater than right temporal lobe white matter involvement again noted. Nodular lesions in the anterior frontal lobes appear stable. Subcortical but no cerebral cortical involvement identified. Stable corpus callosum. Deep gray matter nuclei appear stable. Medial and lateral right thalamic involvement appears chronic (series 7, image 13). The brainstem appears stable and is not definitely affected. Confluent chronic left posterior cerebellar encephalomalacia is stable. No new signal abnormality identified. No IV contrast administered. No chronic cerebral blood products. Vascular: Major intracranial vascular flow voids are stable. Skull and upper cervical spine: Grossly stable. Visualized bone marrow signal is within normal  limits. Sinuses/Orbits: Orbits soft tissues appear stable and normal. Visualized paranasal sinuses and mastoids are stable and well pneumatized. Other: Visible internal auditory structures appear normal. Negative scalp soft tissues. IMPRESSION: 1. Stable noncontrast MRI appearance of demyelinating disease since August 2017. 2. No new intracranial abnormality identified. Electronically Signed   By: Odessa Fleming M.D.   On: 04/21/2016 13:18   Mr Thoracic Spine Wo Contrast  Result Date: 04/21/2016 CLINICAL DATA:  56 year old female with multiple sclerosis. Presents with complaint of Generalized weakness, right upper extremity weakness. Initial encounter. EXAM: MRI THORACIC SPINE WITHOUT CONTRAST TECHNIQUE: Multiplanar, multisequence MR imaging of the thoracic  spine was performed. No intravenous contrast was administered. COMPARISON:  Thoracic spine MRI 11/03/2015. Brain and lumbar MRIs from today reported separately FINDINGS: Limited sagittal imaging of the cervical spine is negative aside from straightening of lordosis. The numbering system on this study agrees with the lumbar MRI from today. Alignment: Stable thoracic vertebral height and alignment since 2017. Vertebrae: No marrow edema or evidence of acute osseous abnormality. Normal bone marrow signal. Cord: Increased confluence of abnormal thoracic spinal cord signal from the T3 through the T5 level (series 6, image 7). Cord involvement here various from the right hemicord at T3 - to the central and left hemi cord at T4 - to nearly holocord involvement at T5 (series 8, image 13). Progressed and near holo cord signal abnormality also at T7 (series 6, image 8). Similar progressed thoracic cord signal abnormality at T9 and T10 (series 6, images 9 and 10). Other patchy thoracic spinal cord heterogeneity, including at T8-T9 and also just above the conus at T12-L1, appears stable. No thoracic cord expansion. No IV contrast administered. Paraspinal and other soft tissues:  Negative. Disc levels: Bulky chronic disc herniation at T10-T11 extending from the midline to the left appears not significantly changed (series 7, image 28 today versus series 6, image 34 previously). As before there is mild mass effect on the left hemi cord but the dorsal CSF space remains patent without definite spinal stenosis. No foraminal involvement. Vacuum phenomena in the parent disc is slightly more apparent (series 5, image 10). IMPRESSION: 1. 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 No IV contrast administered. 2. Chronic bulky T10-T11 disc herniation has not significantly changed. No acute osseous abnormality identified. Electronically Signed   By: Odessa Fleming M.D.   On: 04/21/2016 16:59   Mr Lumbar Spine Wo Contrast  Result Date: 04/21/2016 CLINICAL DATA:  56 year old with multiple sclerosis. Generalized weakness. Recent antibiotic therapy for urinary tract infection. EXAM: MRI LUMBAR SPINE WITHOUT CONTRAST TECHNIQUE: Multiplanar, multisequence MR imaging of the lumbar spine was performed. No intravenous contrast was administered. COMPARISON:  Abdominopelvic CT 04/20/2016.  Thoracic MRI 11/03/2015. FINDINGS: Segmentation: Conventional anatomy assumed, with the last open disc space designated L5-S1. Alignment: Stable mild convex left scoliosis. The lateral alignment is normal. Vertebrae: No worrisome osseous lesion, acute fracture or pars defect. The visualized sacroiliac joints appear unremarkable. Conus medullaris: Extends to the L1-2 level and appears normal. Paraspinal and other soft tissues: No significant paraspinal findings. Persistent bilateral hydronephrosis and hydroureter as seen on recent abdominal CT. Bladder is not imaged on this study. Disc levels: Sagittal images extend from T11 through the mid sacrum. The abnormality at T10-11 seen on the abdominal  CT is not imaged by this examination. Patient did have a central disc herniation with posterior osteophytes at T10-11 on prior thoracic MRI. The T11-12, T12-L1 and L1-2 disc space levels appear normal. L2-3: Mild disc desiccation, bulging and central annular fissure. No disc herniation or spinal stenosis. L3-4: Normal interspace. L4-5: Disc height and hydration are maintained. Mild bilateral facet hypertrophy. No spinal stenosis or nerve root encroachment. L5-S1: Disc height and hydration are maintained. Mild bilateral facet hypertrophy. No spinal stenosis or nerve root encroachment. IMPRESSION: 1. No acute findings demonstrated within the lumbar spine. 2. Mild disc degeneration at L2-3 without resulting spinal stenosis or nerve root encroachment. 3. The abnormality described at T10-11 on recent abdominopelvic CT is not included on  this examination of the lumbar spine. Consider further evaluation with follow-up of thoracic MRI. Patient did have a disc herniation at that level on prior thoracic study done 11/03/2015. 4. Persistent bilateral hydronephrosis and hydroureter of undetermined etiology. Electronically Signed   By: Carey Bullocks M.D.   On: 04/21/2016 13:36   Dg Chest Port 1 View  Result Date: 04/20/2016 CLINICAL DATA:  Tachycardia, weakness and nausea EXAM: PORTABLE CHEST 1 VIEW COMPARISON:  Chest radiograph 10/29/2015 FINDINGS: The lungs are well inflated. Cardiomediastinal contours are normal. No pneumothorax or pleural effusion. No focal airspace consolidation or pulmonary edema. IMPRESSION: Clear lungs. Electronically Signed   By: Deatra Robinson M.D.   On: 04/20/2016 21:45    Micro Results     Recent Results (from the past 240 hour(s))  Blood Culture (routine x 2)     Status: None (Preliminary result)   Collection Time: 04/20/16  8:50 PM  Result Value Ref Range Status   Specimen Description BLOOD RIGHT ANTECUBITAL  Final   Special Requests BOTTLES DRAWN AEROBIC AND ANAEROBIC 5CC  Final    Culture NO GROWTH 3 DAYS  Final   Report Status PENDING  Incomplete  Blood Culture (routine x 2)     Status: None (Preliminary result)   Collection Time: 04/20/16  8:55 PM  Result Value Ref Range Status   Specimen Description BLOOD LEFT ANTECUBITAL  Final   Special Requests AEROBIC BOTTLE ONLY 5CC  Final   Culture NO GROWTH 3 DAYS  Final   Report Status PENDING  Incomplete  Culture, Urine     Status: Abnormal   Collection Time: 04/20/16  9:17 PM  Result Value Ref Range Status   Specimen Description URINE, CATHETERIZED  Final   Special Requests NONE  Final   Culture (A)  Final    50,000 COLONIES/mL METHICILLIN RESISTANT STAPHYLOCOCCUS AUREUS   Report Status 04/23/2016 FINAL  Final   Organism ID, Bacteria METHICILLIN RESISTANT STAPHYLOCOCCUS AUREUS (A)  Final      Susceptibility   Methicillin resistant staphylococcus aureus - MIC*    CIPROFLOXACIN >=8 RESISTANT Resistant     GENTAMICIN <=0.5 SENSITIVE Sensitive     NITROFURANTOIN <=16 SENSITIVE Sensitive     OXACILLIN >=4 RESISTANT Resistant     TETRACYCLINE <=1 SENSITIVE Sensitive     VANCOMYCIN 1 SENSITIVE Sensitive     TRIMETH/SULFA >=320 RESISTANT Resistant     CLINDAMYCIN <=0.25 SENSITIVE Sensitive     RIFAMPIN <=0.5 SENSITIVE Sensitive     Inducible Clindamycin NEGATIVE Sensitive     * 50,000 COLONIES/mL METHICILLIN RESISTANT STAPHYLOCOCCUS AUREUS    Today   Subjective    C[place] Park today has no headache,no chest abdominal pain,no new weakness tingling or numbness, feels much better.   Objective   Blood pressure (!) 148/91, pulse 100, temperature 98.2 F (36.8 C), resp. rate 18, height 5\' 5"  (1.651 m), weight 54.1 kg (119 lb 4.3 oz), last menstrual period 10/25/2015, SpO2 100 %.   Intake/Output Summary (Last 24 hours) at 04/24/16 0935 Last data filed at 04/24/16 0700  Gross per 24 hour  Intake                0 ml  Output             2100 ml  Net            -2100 ml    Exam Awake Alert, Oriented x 3,  And diffuse weakness right more than left strength 3/5, Normal affect [place].AT,PERRAL Supple  Neck,No JVD, No cervical lymphadenopathy appriciated.  Symmetrical Chest wall movement, Good air movement bilaterally, CTAB RRR,No Gallops,Rubs or new Murmurs, No Parasternal Heave +ve B.Sounds, Abd Soft, Non tender, No organomegaly appriciated, No rebound -guarding or rigidity. Foley catheter in place No Cyanosis, Clubbing or edema, No new Rash or bruise   Data Review   CBC w Diff: Lab Results  Component Value Date   WBC 13.8 (H) 04/23/2016   HGB 9.8 (L) 04/23/2016   HCT 30.3 (L) 04/23/2016   HCT 35.5 02/23/2016   PLT 471 (H) 04/23/2016   PLT 531 (H) 02/23/2016   LYMPHOPCT 11 11/03/2015   MONOPCT 12 11/03/2015   EOSPCT 3 11/03/2015   BASOPCT 1 11/03/2015    CMP: Lab Results  Component Value Date   NA 137 04/23/2016   NA 145 (H) 04/26/2015   K 3.7 04/23/2016   CL 107 04/23/2016   CO2 25 04/23/2016   BUN 9 04/23/2016   BUN 14 04/26/2015   CREATININE 0.60 04/23/2016   PROT 8.9 (H) 04/20/2016   PROT 7.0 04/26/2015   ALBUMIN 3.8 04/20/2016   ALBUMIN 4.5 04/26/2015   BILITOT 0.6 04/20/2016   BILITOT 0.6 04/26/2015   ALKPHOS 110 04/20/2016   AST 13 (L) 04/20/2016   ALT 9 (L) 04/20/2016  .   Total Time in preparing paper work, data evaluation and todays exam - 35 minutes  Leroy Sea M.D on 04/24/2016 at 9:35 AM  [facility]   Office  [phone]

## 2016-04-24 NOTE — Progress Notes (Signed)
I have insurance approval to admit pt to inpt rehab today. I met with pt and her spouse at bedside and they are in agreement. I have updated RN CM and SW. I will make the arrangements to admit today. [phone]

## 2016-04-24 NOTE — Care Management Note (Signed)
Case Management Note  Patient Details  Name: Crystal Park MRN: [id number] Date of Birth: February 20, 1961  Subjective/Objective:     Pt with medical history significant of multiple sclerosis; who presented with complaints of generalized weakness. Pt admitted with sepsis due to UTI.          Action/Plan: Plan is to d/c to CIR today.  Expected Discharge Date:  04/24/16               Expected Discharge Plan:  IP Rehab Facility  In-House Referral:  Clinical Social Work  Discharge planning Services  CM Consult  Post Acute Care Choice:    Choice offered to:     DME Arranged:    DME Agency:     HH Arranged:    HH Agency:     Status of Service:  Completed, signed off  If discussed at [employer], dates discussed:    Additional Comments:  Epifanio Lesches, RN 04/24/2016, 12:04 PM

## 2016-04-24 NOTE — Progress Notes (Signed)
Patient admitting to CIR today. CSW will sign off.  Crystal Park LCSWA [phone]

## 2016-04-24 NOTE — Progress Notes (Addendum)
Physical Therapy Treatment Patient Details Name: BRITTNAY PIGMAN MRN: [id number] DOB: 04/14/1960 Today's Date: 04/24/2016    History of Present Illness 56 y.o. female with medical history significant of multiple sclerosis; who presented with complaints of generalized weakness. Pt admitted with sepsis due to UTI.     PT Comments    Pt remains to require  +2 assist for transfer to chair but able to progress to upright stance with R knee blocked.  Pt also tolerated exercises in chair including isometric quad sets.  Pt remains weak but motivated to progress and return home.  Continue to recommend CIR for continued rehab before returning home.    Follow Up Recommendations  CIR     Equipment Recommendations   (TBA at next venue.)    Recommendations for Other Services       Precautions / Restrictions Precautions Precautions: Fall Restrictions Weight Bearing Restrictions: No    Mobility  Bed Mobility Overal bed mobility: Needs Assistance Bed Mobility: Supine to Sit     Supine to sit: Max assist;+2 for physical assistance     General bed mobility comments: Pt required assist to advance B LEs to edge of bed.  Pt then required assistance to elevate trunk.  Pt with minimal trace muscle contractions in efforts to advance to edge of bed.  PTA used chux pad to advance patient to edge of bed in prep for transfer to chair.  Pt presents with improved sitting baLance edge of bed.    Transfers Overall transfer level: Needs assistance Equipment used: None;2 person hand held assist Transfers: Stand Pivot Transfers     Squat pivot transfers: Max assist;+2 physical assistance     General transfer comment: Pt required blocking of R knee and use of gait belt to transfer from bed to chair with stand pivot and shuffling steps.  Remains to require +2 external assist from PTA and rehab tech for safe transfer to chair from bed.    Ambulation/Gait Ambulation/Gait assistance:  (unable )                Stairs            Wheelchair Mobility    Modified Rankin (Stroke Patients Only)       Balance Overall balance assessment: Needs assistance   Sitting balance-Leahy Scale: Fair Sitting balance - Comments: progressed to fair sitting edge of bed.       Standing balance-Leahy Scale: Zero                      Cognition Arousal/Alertness: Awake/alert Behavior During Therapy: WFL for tasks assessed/performed Overall Cognitive Status: Within Functional Limits for tasks assessed                      Exercises Total Joint Exercises Ankle Circles/Pumps: AROM;Both;10 reps;AAROM;Supine (able to initiate movement then require assist to achieve full ROM dorsiflexion.  ) Quad Sets: AROM;Both;10 reps;Supine Heel Slides: Both;10 reps;Supine;AAROM    General Comments        Pertinent Vitals/Pain Pain Assessment: No/denies pain    Home Living                      Prior Function            PT Goals (current goals can now be found in the care plan section) Acute Rehab PT Goals Patient Stated Goal: home Potential to Achieve Goals: Good Progress towards PT goals: Progressing toward goals  Frequency    Min 4X/week      PT Plan Current plan remains appropriate    Co-evaluation             End of Session Equipment Utilized During Treatment: Gait belt Activity Tolerance: Patient limited by fatigue Patient left: in chair;with call bell/phone within reach;with family/visitor present Nurse Communication: Mobility status       Time: 1103-1594 PT Time Calculation (min) (ACUTE ONLY): 15 min  Charges:  $Therapeutic Activity: 8-22 mins                    G Codes:       Florestine Avers 2016-05-04, 12:12 PM Joycelyn Rua, PTA pager [phone]

## 2016-04-24 NOTE — Progress Notes (Signed)
Crystal Gong, RN Rehab Admission Coordinator Signed Physical Medicine and Rehabilitation  PMR Pre-admission Date of Service: 04/24/2016 12:53 PM  Related encounter: ED to Hosp-Admission (Discharged) from 04/20/2016 in [facility]       '[]'$ Hide copied text PMR Admission Coordinator Pre-Admission Assessment  Patient: Crystal Park is an 55 y.o., female MRN: [id number] DOB: [birth year] Height: '5\' 5"'$  (165.1 cm) Weight: 54.1 kg (119 lb 4.3 oz)                                                                                                                                                  Insurance Information HMO:     PPO: yes     PCP:      IPA:      80/20:      OTHER:  PRIMARY: [employer]      Policy#: [id number]      Subscriber: spouse CM Name: Butch Penny      Phone#: [phone] ext [phone]     Fax#: EPIC access Pre-Cert#: [id number] approved from y days until 2/16 when updates are due to Raynald Blend phone [phone] ext [phone] fax [phone]      Employer: [employer] Benefits:  Phone #: [phone]     Name: 04/23/16 Eff. Date: 03/05/16     Deduct: $3200/met      Out of Pocket Max: $3750 ( met [phone])      Life Max: none CIR: 85 %      SNF: 85% 60 days Outpatient: 85%     Co-Pay: 60 visits combined Home Health: 85%      Co-Pay: unlimited DME: 85%     Co-Pay: 15% Providers: in network  SECONDARY: none       Medicaid Application Date:       Case Manager:  Disability Application Date:       Case Worker:   Emergency Tax adviser Information    Name Relation Home Work Mobile   Curtisha, Bendix [phone]  [phone]     Current Medical History  Patient Admitting Diagnosis: Tetraplegia related to MS exacerbation.   History of Present Illness: HPI: Crystal Park a 56 y.o.Right-handed femalewith hx of MSdiagnosed 9/16on [place] [facility].  Admitted 04/20/16 with complaints  ofprogressiveweakness/recent UTIcompleting Macrobid/urinary retention. Work upwith CT of abdomen and pelvisrevealed urosepsis bilateral hydronephrosis.Creatinine 2.12.The patient was started on empiric rocephin and urology was consulted. Urology dxed neurogenic bladder and recommended ongoing abx, foleywith plan voiding trial.Patient also was mild hematuria felt to be related to a combination of Foley catheter tubing UTI. Latest follow-up urine culture 04/23/2016 50,000 MRSA maintained on contact precautions and currently maintained on Vantin200 mg twice a day initiated 04/24/2016.Marland KitchenMRI of the brain showed stable appearance of demyelinating disease since August 2017 no new intracranial abnormality  identified. 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 Await plan for MRI cervical spine. Renal function improved with gentle hydration latest creatinine 0.60. Patient remains on Dimethyl Fumaratefor her multiple sclerosis. Tolerating a regular consistency diet.   Past Medical History      Past Medical History:  Diagnosis Date  . Benign paroxysmal positional vertigo 10/12/2014  . MS (multiple sclerosis) ([place])    diagnosed 9/16    Family History  family history is not on file.  Prior Rehab/Hospitalizations:  Has the patient had major surgery during 100 days prior to admission? No  Current Medications   Current Facility-Administered Medications:  .  albuterol (PROVENTIL) (2.5 MG/3ML) 0.083% nebulizer solution 2.5 mg, 2.5 mg, Nebulization, Q2H PRN, Norval Morton, MD .  cefpodoxime (VANTIN) tablet 200 mg, 200 mg, Oral, Q12H, Thurnell Lose, MD, 200 mg at 04/24/16 1040 .  diazepam (VALIUM) tablet 5 mg, 5 mg, Oral, Q12H PRN, Norval Morton, MD, 5 mg at 04/22/16 0849 .  dimenhyDRINATE (DRAMAMINE) tablet 50 mg, 50 mg, Oral, Q8H PRN,  Norval Morton, MD .  Dimethyl Fumarate CPDR 240 mg, 1 capsule, Oral, BID, Norval Morton, MD, 240 mg at 04/24/16 1040 .  feeding supplement (ENSURE ENLIVE) (ENSURE ENLIVE) liquid 237 mL, 237 mL, Oral, BID BM, Rondell A Smith, MD, 237 mL at 04/24/16 1040 .  HYDROcodone-acetaminophen (NORCO/VICODIN) 5-325 MG per tablet 1 tablet, 1 tablet, Oral, Q6H PRN, Norval Morton, MD, 1 tablet at 04/24/16 [phone] .  MEDLINE mouth rinse, 15 mL, Mouth Rinse, BID, Rondell A Smith, MD, 15 mL at 04/24/16 1041 .  morphine 2 MG/ML injection 2 mg, 2 mg, Intravenous, Q3H PRN, Norval Morton, MD, 2 mg at 04/22/16 2300 .  [DISCONTINUED] ondansetron (ZOFRAN) tablet 4 mg, 4 mg, Oral, Q6H PRN **OR** ondansetron (ZOFRAN) injection 4 mg, 4 mg, Intravenous, Q6H PRN, Norval Morton, MD  Facility-Administered Medications Ordered in Other Encounters:  .  gadopentetate dimeglumine (MAGNEVIST) injection 11 mL, 11 mL, Intravenous, Once PRN, Britt Bottom, MD .  gadopentetate dimeglumine (MAGNEVIST) injection 12 mL, 12 mL, Intravenous, Once PRN, Britt Bottom, MD  Patients Current Diet: Diet regular Room service appropriate? Yes; Fluid consistency: Thin  Precautions / Restrictions Precautions Precautions: Fall Restrictions Weight Bearing Restrictions: No   Has the patient had 2 or more falls or a fall with injury in the past year?No  Prior Activity Level Limited Community (1-2x/wk): Mod I RW pta. does not drive. Mod I with adls, warms food in microwave as needed when spouse out  Development worker, international aid / [place] Devices/Equipment: Civil engineer, contracting without back, Environmental consultant (specify type), Cane (specify quad or straight), Bedside commode/3-in-1 Home Equipment: Cane - single point, Shower seat, Environmental consultant - 2 wheels, Grab bars - tub/shower  Prior Device Use: Indicate devices/aids used by the patient prior to current illness, exacerbation or injury? Walker  Prior Functional Level Prior Function Level of  Independence: Independent with assistive device(s) Comments: Independent household ambulator with RW. Independent with ADLs.  Self Care: Did the patient need help bathing, dressing, using the toilet or eating?  Independent  Indoor Mobility: Did the patient need assistance with walking from room to room (with or without device)? Independent  Stairs: Did the patient need assistance with internal or external stairs (with or without device)? Independent  Functional Cognition: Did the patient need help planning  regular tasks such as shopping or remembering to take medications? Needed some help  Current Functional Level Cognition  Overall Cognitive Status: Within Functional Limits for tasks assessed Orientation Level: Oriented X4    Extremity Assessment (includes Sensation/Coordination)  Upper Extremity Assessment: RUE deficits/detail RUE Deficits / Details: generalized weakness shoulder flexion 90 degrees unable to sustain due to fatigue  Lower Extremity Assessment: Defer to PT evaluation RLE Deficits / Details: weaker side from MS, grossly </= 2/5 LLE Deficits / Details: grossly 3/5    ADLs  Overall ADL's : Needs assistance/impaired Eating/Feeding: Set up, Sitting Grooming: Wash/dry face, Minimal assistance, Sitting Upper Body Bathing: Moderate assistance Lower Body Bathing: Maximal assistance Upper Body Dressing : Moderate assistance Lower Body Dressing: Maximal assistance Toilet Transfer: +2 for physical assistance, Maximal assistance General ADL Comments: pt required total +2 and demonstrates decr trunk control at EOB. pt with anterior LOB. Pt with BIL UE on bed surface (A)ing    Mobility  Overal bed mobility: Needs Assistance Bed Mobility: Supine to Sit Supine to sit: Max assist, +2 for physical assistance General bed mobility comments: Pt required assist to advance B LEs to edge of bed.  Pt then required assistance to elevate trunk.  Pt with minimal trace muscle  contractions in efforts to advance to edge of bed.  PTA used chux pad to advance patient to edge of bed in prep for transfer to chair.  Pt presents with improved sitting baLance edge of bed.      Transfers  Overall transfer level: Needs assistance Equipment used: None, 2 person hand held assist Transfers: Stand Pivot Transfers Squat pivot transfers: Max assist, +2 physical assistance General transfer comment: Pt required blocking of R knee and use of gait belt to transfer from bed to chair with stand pivot and shuffling steps.  Remains to require +2 external assist from PTA and rehab tech for safe transfer to chair from bed.      Ambulation / Gait / Stairs / Wheelchair Mobility  Ambulation/Gait Ambulation/Gait assistance:  (unable ) General Gait Details: unable    Posture / Balance Dynamic Sitting Balance Sitting balance - Comments: progressed to fair sitting edge of bed.   Balance Overall balance assessment: Needs assistance Sitting-balance support: Single extremity supported, Feet supported Sitting balance-Leahy Scale: Fair Sitting balance - Comments: progressed to fair sitting edge of bed.   Standing balance support: During functional activity, Single extremity supported Standing balance-Leahy Scale: Zero    Special needs/care consideration BiPAP/CPAP  N/a CPM  N/a Continuous Drip IV  N/a Dialysis  N/a Life Vest  N/a Oxygen  N/a Special Bed  N/a Trach Size  N/a Wound Vac (area)  N/a Skin intact Bowel mgmt: incontinent LBM 2/18. Continent pta Bladder mgmt: indwelling catheter Diabetic mgmt  N.a Contact precautions for MRSA   Previous Home Environment Living Arrangements: Spouse/significant other  Lives With: Spouse Available Help at Discharge: Family, Available 24 hours/day (spouse can work from home temporarily) Type of Home: House Home Layout: Two level, Able to live on main level with bedroom/bathroom Home Access: Level entry Bathroom Shower/Tub: Walk-in  shower (1 inch step into shower; grab bars installed, pull down show) Firefighter: Handicapped height Bathroom Accessibility: Yes Home Care Services: No Additional Comments: new handicapped remodel to bathroom down stairs  Discharge Living Setting Plans for Discharge Living Setting: Patient's home, Lives with (comment) (spouse) Type of Home at Discharge: House Discharge Home Layout: Able to live on main level with bedroom/bathroom, Two level Discharge Home Access: Stairs  to enter Entrance Stairs-Rails: Right, Left, Can reach both Entrance Stairs-Number of Steps: 3-4 Discharge Bathroom Shower/Tub: Walk-in shower (with one instep into shower, grab bars and pull down shower ) Discharge Bathroom Toilet: Handicapped height Discharge Bathroom Accessibility: Yes How Accessible: Accessible via walker Does the patient have any problems obtaining your medications?: No  Renovations to bathroom occurring while pt hospitalized for handicapped accommodations  Social/Family/Support Systems Patient Roles: Spouse, Parent (two dauhgters) Contact Information: Mitzi Hansen, spouse Anticipated Caregiver: spouse and local daughter Anticipated Caregiver's Contact Information: see above Ability/Limitations of Caregiver: spouse works for [employer] and can work from home temporarily Caregiver Availability: 24/7 Discharge Plan Discussed with Primary Caregiver: Yes Is Caregiver In Agreement with Plan?: Yes Does Caregiver/Family have Issues with Lodging/Transportation while Pt is in Rehab?: No  Goals/Additional Needs Patient/Family Goal for Rehab: supervision to min assist with PT and OT, supervision to Mod I with SLP Expected length of stay: ELOS 17-22 days Pt/Family Agrees to Admission and willing to participate: Yes Program Orientation Provided & Reviewed with Pt/Caregiver Including Roles  & Responsibilities: Yes  Decrease burden of Care through IP rehab admission: n/a  Possible need for SNF placement  upon discharge: not anticipated  Patient Condition: This patient's medical and functional status has changed since the consult dated: 04/22/2016 in which the Rehabilitation Physician determined and documented that the patient's condition is appropriate for intensive rehabilitative care in an inpatient rehabilitation facility. Medical changes are: Iv antibiotics to po for d/c.  Functional changes are: overall max assist transfers. After evaluating the patient today and speaking with the Rehabilitation physician and acute team, the patient remains appropriate for inpatient rehab. Will admit to inpatient rehab today.  Preadmission Screen Completed By:  Cleatrice Burke, 04/24/2016 1:04 PM ______________________________________________________________________   Discussed status with Dr. Naaman Plummer on 04/24/2016 at 13-3 and received telephone approval for admission today.  Admission Coordinator:  Cleatrice Burke, time 3295 Date 04/24/2016       Cosigned by: Meredith Staggers, MD at 04/24/2016 1:56 PM  Revision History

## 2016-04-24 NOTE — H&P (Signed)
Physical Medicine and Rehabilitation Admission H&P       Chief Complaint  Patient presents with  . Weakness  : HPI: [place] a 56 y.o.Right-handed femalewith hx of MS diagnosed 9/16 on Tecfidera followed by Dr.Seiter at [facility] neurology. Per chart review patient lives with spouse. Independent with assistive device/RW prior to admission and independent with ADLs. 2 level home with bedroom on first floor. Admitted 04/20/16 with complaints of progressive weakness/recent UTI completing Macrobid/urinary retention. Work up with CT of abdomen and pelvis revealed urosepsis  bilateral hydronephrosis. Creatinine 2.12. The patient was started on empiric rocephin and urology was consulted. Urology dxed neurogenic bladder and recommended ongoing abx, foley with plan voiding trial. Patient also was mild hematuria felt to be related to a combination of Foley catheter tubing UTI. Latest follow-up urine culture 04/23/2016 50,000 MRSA maintained on contact precautions and currently maintained on Vantin 200 mg twice a day initiated 04/24/2016.Marland Kitchen MRI of the brain showed stable appearance of demyelinating disease since August 2017 no new intracranial abnormality identified. 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 Await plan for MRI cervical spine. Renal function improved with gentle hydration latest creatinine 0.60. Patient remains on Dimethyl Fumarate for her multiple sclerosis. Tolerating a regular consistency diet.  Review of Systems  Constitutional: Negative for chills and fever.  HENT: Negative for hearing loss and tinnitus.   Eyes: Negative for blurred vision and double vision.  Respiratory: Negative for cough and shortness of breath.   Cardiovascular: Positive for leg swelling. Negative for chest pain and palpitations.    Gastrointestinal: Positive for constipation. Negative for nausea and vomiting.  Genitourinary: Positive for dysuria and urgency.       Urinary retention  Musculoskeletal: Positive for joint pain and myalgias. Negative for falls.  Skin: Negative for rash.  Neurological: Positive for dizziness, sensory change and weakness. Negative for seizures.  Psychiatric/Behavioral:       Anxiety  All other systems reviewed and are negative.      Past Medical History:  Diagnosis Date  . Benign paroxysmal positional vertigo 10/12/2014  . MS (multiple sclerosis) ([place])    diagnosed 9/16        Past Surgical History:  Procedure Laterality Date  . OOPHORECTOMY Left    2013   History reviewed. No pertinent family history. Social History:  reports that she has never smoked. She has never used smokeless tobacco. She reports that she does not drink alcohol or use drugs. Allergies: No Known Allergies       Medications Prior to Admission  Medication Sig Dispense Refill  . diazepam (VALIUM) 5 MG tablet Take 1 tablet (5 mg total) by mouth every 8 (eight) hours as needed for anxiety. (Patient taking differently: Take 5 mg by mouth every 12 (twelve) hours as needed for anxiety (for dizziness also). ) 90 tablet 3  . TECFIDERA 240 MG CPDR TAKE 1 CAPSULE BY MOUTH TWICE DAILY 60 capsule 10  . amphetamine-dextroamphetamine (ADDERALL XR) 15 MG 24 hr capsule Take 1 capsule by mouth every morning. (Patient not taking: Reported on 04/20/2016) 30 capsule 0  . cholecalciferol (VITAMIN D) 1000 UNITS tablet Take 1,000 Units by mouth daily. otc vit. d 5,000iu daily/fim    . CRANBERRY PO Take 1 capsule by mouth daily.    Marland Kitchen dimenhyDRINATE (DRAMAMINE) 50 MG tablet Take 50 mg by mouth every 8 (eight)  hours as needed for dizziness.    . nitrofurantoin (MACRODANTIN) 100 MG capsule Take 100 mg by mouth 2 (two) times daily.    Marland Kitchen oxybutynin (DITROPAN-XL) 10 MG 24 hr tablet Take 1 tablet (10 mg total) by mouth at  bedtime. (Patient not taking: Reported on 04/20/2016) 90 tablet 3    Home: [place] expects to be discharged to:: Private residence Living Arrangements: Spouse/significant other Available Help at Discharge: Family, Available 24 hours/day (works from home) Type of Home: House Home Access: Level entry [place]: Two level, Able to live on main level with bedroom/bathroom Bathroom Shower/Tub: Multimedia programmer: Handicapped height Bathroom Accessibility: Yes Home Equipment: [employer] - single point, Civil engineer, contracting, Environmental consultant - 2 wheels, Grab bars - tub/shower   Functional History: Prior Function Level of Independence: Independent with assistive device(s) Comments: Independent household ambulator with RW. Independent with ADLs.  Functional Status:  Mobility: Bed Mobility Overal bed mobility: Needs Assistance Bed Mobility: Supine to Sit Supine to sit: Max assist, +2 for physical assistance General bed mobility comments: Pt required assist to advance B LEs to edge of bed.  Pt then required assistance to elevate trunk.  Pt with minimal trace muscle contractions in efforts to advance to edge of bed.  OT used chux pad to advance patient to edge of bed in prep for transfer to chair.   Transfers Overall transfer level: Needs assistance Equipment used: Rolling walker (2 wheeled) Transfers: Squat Pivot Transfers Squat pivot transfers: Max assist, +2 physical assistance General transfer comment: Pt required blocking of B knees with use of chux pad to transfer from bed to chair via squat/stand pivot.  Pt performed with +2 external assist.     Ambulation/Gait Ambulation/Gait assistance:  (remains unable due to instability in R knee.  ) General Gait Details: unable  ADL: ADL Overall ADL's : Needs assistance/impaired Eating/Feeding: Set up, Sitting Grooming: Wash/dry face, Minimal assistance, Sitting Upper Body Bathing: Moderate assistance Lower Body Bathing: Maximal  assistance Upper Body Dressing : Moderate assistance Lower Body Dressing: Maximal assistance Toilet Transfer: +2 for physical assistance, Maximal assistance General ADL Comments: pt required total +2 and demonstrates decr trunk control at EOB. pt with anterior LOB. Pt with BIL UE on bed surface (A)ing  Cognition: Cognition Overall Cognitive Status: Within Functional Limits for tasks assessed Orientation Level: Oriented X4 Cognition Arousal/Alertness: Awake/alert Behavior During Therapy: WFL for tasks assessed/performed Overall Cognitive Status: Within Functional Limits for tasks assessed  Physical Exam: Blood pressure (!) 148/91, pulse 100, temperature 98.2 F (36.8 C), resp. rate 18, height 5' 5" (1.651 m), weight 54.1 kg (119 lb 4.3 oz), last menstrual period 10/25/2015, SpO2 100 %. Physical Exam  Constitutional: No distress.  56 year old right-handed frail female  HENT:  Head: Normocephalic and atraumatic.  Eyes: EOM are normal. Left eye exhibits no discharge.  Pupils round and reactive to light  Neck: Normal range of motion. Neck supple. No tracheal deviation present. No thyromegaly present.  Cardiovascular: Normal rate, regular rhythm and normal heart sounds.  Exam reveals no gallop.   No murmur heard. Respiratory: No respiratory distress. She has no wheezes. She has no rales.  Good inspiratory effort  GI: Soft. Bowel sounds are normal. She exhibits no distension.  Musculoskeletal: She exhibits no edema.  Skin: Skin is warm and dry. She is not diaphoretic.  Skin. Warm and dry Neurological:  RUE 3-/5 prox to distal. RLE tr to 1/5 prox to distal. LUE 3-4/5. LLE 1+ to 2/5 also. Decreased LT and  pain RUE and RLE. Near normal sensation LUE and perhaps 1+/2 LLE. DTR's 3+ throughout without resting tone. Cognitively displays reasonable insight and awareness. No focal CN findings. Speech clear. Normal language. Psych: pt is pleasant and appropriate.    Lab Results Last 48  Hours        Results for orders placed or performed during the hospital encounter of 04/20/16 (from the past 48 hour(s))  Glucose, capillary     Status: None   Collection Time: 04/22/16  8:52 AM  Result Value Ref Range   Glucose-Capillary 81 65 - 99 mg/dL  CBC     Status: Abnormal   Collection Time: 04/23/16  5:42 AM  Result Value Ref Range   WBC 13.8 (H) 4.0 - 10.5 K/uL   RBC 3.50 (L) 3.87 - 5.11 MIL/uL   Hemoglobin 9.8 (L) 12.0 - 15.0 g/dL   HCT 30.3 (L) 36.0 - 46.0 %   MCV 86.6 78.0 - 100.0 fL   MCH 28.0 26.0 - 34.0 pg   MCHC 32.3 30.0 - 36.0 g/dL   RDW 15.9 (H) 11.5 - 15.5 %   Platelets 471 (H) 150 - 400 K/uL  Basic metabolic panel     Status: Abnormal   Collection Time: 04/23/16  5:42 AM  Result Value Ref Range   Sodium 137 135 - 145 mmol/L   Potassium 3.7 3.5 - 5.1 mmol/L   Chloride 107 101 - 111 mmol/L   CO2 25 22 - 32 mmol/L   Glucose, Bld 99 65 - 99 mg/dL   BUN 9 6 - 20 mg/dL   Creatinine, Ser 0.60 0.44 - 1.00 mg/dL   Calcium 8.4 (L) 8.9 - 10.3 mg/dL   GFR calc non Af Amer >60 >60 mL/min   GFR calc Af Amer >60 >60 mL/min    Comment: (NOTE) The eGFR has been calculated using the CKD EPI equation. This calculation has not been validated in all clinical situations. eGFR's persistently <60 mL/min signify possible Chronic Kidney Disease.    Anion gap 5 5 - 15     Imaging Results (Last 48 hours)  No results found.       Medical Problem List and Plan: 1.  Tetraplegia secondary to MS exacerbation related to Urosepsis. Continue Dimethyl Fumarate for multiple sclerosis             -admit to inpatient rehab             -ordered MRI of cervical spine to complete work up given severity of upper and lower body symptoms. Thoracic MRI revealed progression of demyelinating disease throughout the thoracic cord. August 2017 cervical MRI revealed disease at multiple levels as well              -neuro consult             -tecfidera per home  dose at present 2.  DVT Prophylaxis/Anticoagulation: SCDs. Check vascular study 3. Pain Management: Hydrocodone as needed 4. Mood: Valium 5 mg every 12 hours as needed anxiety 5. Neuropsych: This patient is capable of making decisions on her own behalf. 6. Skin/Wound Care: Routine skin checks 7. Fluids/Electrolytes/Nutrition: Routine I&O with follow-up chemistries 8. Neurogenic bladder. Plan voiding trial. Follow-up urology services 9. MRSA UTI. Vantin 200 mg every 12 hours initiated 02/20/20184 more days. Continue contact precautions 10. Acute renal failure/bilateral hydronephrosis. Renal function improved and follow-up chemistries. Follow-up urology services   Post Admission Physician Evaluation: 1. Functional deficits secondary  to MS exacerbation. 2. Patient is  admitted to receive collaborative, interdisciplinary care between the physiatrist, rehab nursing staff, and therapy team. 3. Patient's level of medical complexity and substantial therapy needs in context of that medical necessity cannot be provided at a lesser intensity of care such as a SNF. 4. Patient has experienced substantial functional loss from his/her baseline which was documented above under the "Functional History" and "Functional Status" headings.  Judging by the patient's diagnosis, physical exam, and functional history, the patient has potential for functional progress which will result in measurable gains while on inpatient rehab.  These gains will be of substantial and practical use upon discharge  in facilitating mobility and self-care at the household level. 5. Physiatrist will provide 24 hour management of medical needs as well as oversight of the therapy plan/treatment and provide guidance as appropriate regarding the interaction of the two. 6. The Preadmission Screening has been reviewed and patient status is unchanged unless otherwise stated above. 7. 24 hour rehab nursing will assist with bladder management,  bowel management, safety, skin/wound care, disease management, medication administration, pain management and patient education  and help integrate therapy concepts, techniques,education, etc. 8. PT will assess and treat for/with: Lower extremity strength, range of motion, stamina, balance, functional mobility, safety, adaptive techniques and equipment, NMR, community reintegration, ego support.   Goals are: supervision to min assist. 9. OT will assess and treat for/with: ADL's, functional mobility, safety, upper extremity strength, adaptive techniques and equipment, NMR, family ed, community reintegration.   Goals are: supervision to min+ assist. Therapy may proceed with showering this patient. 10. SLP will assess and treat for/with: cognition, communication.  Goals are: mod I. 11. Case Management and Social Worker will assess and treat for psychological issues and discharge planning. 12. Team conference will be held weekly to assess progress toward goals and to determine barriers to discharge. 13. Patient will receive at least 3 hours of therapy per day at least 5 days per week. 14. ELOS: 14-20 days       15. Prognosis:  excellent     Meredith Staggers, MD, [facility] 04/24/2016  Cathlyn Parsons., PA-C 04/24/2016

## 2016-04-24 NOTE — Progress Notes (Signed)
Crystal Oyster, MD Physician Signed Physical Medicine and Rehabilitation  Consult Note Date of Service: 04/21/2016 4:41 PM  Related encounter: ED to Hosp-Admission (Discharged) from 04/20/2016 in [facility] 5W MEDICAL     Expand All Collapse All   [] Hide copied text [] Hover for attribution information      Physical Medicine and Rehabilitation Consult Reason for Consult:weakness Referring Physician: Ghimire   HPI: Crystal Park is a 56 y.o. female with hx of MS who was admitted 04/20/16 with complaints of weakness/recent UTI. Work up revealed urosepsis with acute rental failure and bilateral hydronephrosis. The patient was started on empiric rocephin and urology was consulted. Urology dxed neurogenic bladder and recommended ongoing abx, foley. The patient may need stent placement as well. Pt displaying functional deficits with therapy and PM&R was consulted to assess for potential rehab needs moving forward. Brain and lumbar MRI performed yesterday were negative for progressive disease. However, thoracic imaging notable for progression of demyelination. Cervical MRI not performed.    Review of Systems  Constitutional: Positive for chills.  HENT: Negative for tinnitus.   Eyes: Negative for double vision.  Respiratory: Negative for wheezing.   Cardiovascular: Negative for chest pain.  Gastrointestinal: Negative for nausea and vomiting.  Genitourinary: Positive for dysuria and urgency.  Musculoskeletal: Positive for myalgias.  Neurological: Positive for tingling, sensory change and focal weakness.  Psychiatric/Behavioral: Negative for substance abuse.       Past Medical History:  Diagnosis Date  . Benign paroxysmal positional vertigo 10/12/2014  . MS (multiple sclerosis) (HCC)    diagnosed 9/16        Past Surgical History:  Procedure Laterality Date  . OOPHORECTOMY Left    2013   History reviewed. No pertinent family history. Social  History:  reports that she has never smoked. She has never used smokeless tobacco. She reports that she does not drink alcohol or use drugs. Allergies: No Known Allergies       Medications Prior to Admission  Medication Sig Dispense Refill  . diazepam (VALIUM) 5 MG tablet Take 1 tablet (5 mg total) by mouth every 8 (eight) hours as needed for anxiety. (Patient taking differently: Take 5 mg by mouth every 12 (twelve) hours as needed for anxiety (for dizziness also). ) 90 tablet 3  . TECFIDERA 240 MG CPDR TAKE 1 CAPSULE BY MOUTH TWICE DAILY 60 capsule 10  . amphetamine-dextroamphetamine (ADDERALL XR) 15 MG 24 hr capsule Take 1 capsule by mouth every morning. (Patient not taking: Reported on 04/20/2016) 30 capsule 0  . cholecalciferol (VITAMIN D) 1000 UNITS tablet Take 1,000 Units by mouth daily. otc vit. d 5,000iu daily/fim    . CRANBERRY PO Take 1 capsule by mouth daily.    Marland Kitchen dimenhyDRINATE (DRAMAMINE) 50 MG tablet Take 50 mg by mouth every 8 (eight) hours as needed for dizziness.    . nitrofurantoin (MACRODANTIN) 100 MG capsule Take 100 mg by mouth 2 (two) times daily.    Marland Kitchen oxybutynin (DITROPAN-XL) 10 MG 24 hr tablet Take 1 tablet (10 mg total) by mouth at bedtime. (Patient not taking: Reported on 04/20/2016) 90 tablet 3    Home: Home Living Family/patient expects to be discharged to:: Private residence Living Arrangements: Spouse/significant other Available Help at Discharge: Family, Available 24 hours/day (works from home) Type of Home: House Home Access: Level entry Home Layout: Two level, Able to live on main level with bedroom/bathroom Bathroom Shower/Tub: Health visitor: Handicapped height Bathroom Accessibility: Yes Home Equipment: [employer] -  single point, Shower seat, Environmental consultant - 2 wheels, Grab bars - tub/shower  Functional History: Prior Function Level of Independence: Independent with assistive device(s) Comments: Independent household ambulator with RW.  Independent with ADLs. Functional Status:  Mobility: Bed Mobility Overal bed mobility: Needs Assistance Bed Mobility: Supine to Sit Supine to sit: Max assist General bed mobility comments: use of bed pad to scoot to EOB. Assist to elevate trunk. Transfers Overall transfer level: Needs assistance Transfers: Squat Pivot Transfers Squat pivot transfers: Max assist General transfer comment: pivot transfer to right bed to recliner. Therapist braced R knee to prevent buckling. Ambulation/Gait General Gait Details: unable  ADL:  Cognition: Cognition Overall Cognitive Status: Within Functional Limits for tasks assessed Orientation Level: Oriented X4 Cognition Arousal/Alertness: Awake/alert Behavior During Therapy: WFL for tasks assessed/performed Overall Cognitive Status: Within Functional Limits for tasks assessed  Blood pressure (!) 159/87, pulse (!) 110, temperature 98.3 F (36.8 C), temperature source Oral, resp. rate 18, height 5\' 5"  (1.651 m), weight 54.1 kg (119 lb 4.3 oz), last menstrual period 10/25/2015, SpO2 100 %. Physical Exam  Constitutional: No distress.  Frail appearing  Eyes: Pupils are equal, round, and reactive to light.  Neck: No thyromegaly present.  Cardiovascular: Normal rate.   Respiratory: No respiratory distress.  GI: She exhibits no distension.  Musculoskeletal: She exhibits no deformity.  Neurological:  RUE 1 to 1+/5 prox to distal. RLE tr to 1/5 prox to distal. LUE 3-4/5. LLE 3-4/5 also. Decreased LT and pain RUE and RLE. Near normal sensation LUE and LLE. DTR's 3+ throughout.   Skin: She is not diaphoretic.    Lab Results Last 24 Hours       Results for orders placed or performed during the hospital encounter of 04/20/16 (from the past 24 hour(s))  Comprehensive metabolic panel     Status: Abnormal   Collection Time: 04/20/16  6:45 PM  Result Value Ref Range   Sodium 140 135 - 145 mmol/L   Potassium 3.4 (L) 3.5 - 5.1 mmol/L   Chloride  108 101 - 111 mmol/L   CO2 15 (L) 22 - 32 mmol/L   Glucose, Bld 137 (H) 65 - 99 mg/dL   BUN 39 (H) 6 - 20 mg/dL   Creatinine, Ser 2.33 (H) 0.44 - 1.00 mg/dL   Calcium 43.5 8.9 - 68.6 mg/dL   Total Protein 8.9 (H) 6.5 - 8.1 g/dL   Albumin 3.8 3.5 - 5.0 g/dL   AST 13 (L) 15 - 41 U/L   ALT 9 (L) 14 - 54 U/L   Alkaline Phosphatase 110 38 - 126 U/L   Total Bilirubin 0.6 0.3 - 1.2 mg/dL   GFR calc non Af Amer 25 (L) >60 mL/min   GFR calc Af Amer 30 (L) >60 mL/min   Anion gap 17 (H) 5 - 15  CBC     Status: Abnormal   Collection Time: 04/20/16  6:45 PM  Result Value Ref Range   WBC 21.3 (H) 4.0 - 10.5 K/uL   RBC 4.68 3.87 - 5.11 MIL/uL   Hemoglobin 13.5 12.0 - 15.0 g/dL   HCT 16.8 37.2 - 90.2 %   MCV 85.9 78.0 - 100.0 fL   MCH 28.8 26.0 - 34.0 pg   MCHC 33.6 30.0 - 36.0 g/dL   RDW 11.1 55.2 - 08.0 %   Platelets 695 (H) 150 - 400 K/uL  I-Stat CG4 Lactic Acid, ED     Status: Abnormal   Collection Time: 04/20/16  7:26 PM  Result Value Ref Range   Lactic Acid, Venous 2.28 (HH) 0.5 - 1.9 mmol/L   Comment NOTIFIED PHYSICIAN   Blood Culture (routine x 2)     Status: None (Preliminary result)   Collection Time: 04/20/16  8:50 PM  Result Value Ref Range   Specimen Description BLOOD RIGHT ANTECUBITAL    Special Requests BOTTLES DRAWN AEROBIC AND ANAEROBIC 5CC    Culture NO GROWTH < 24 HOURS    Report Status PENDING   Blood Culture (routine x 2)     Status: None (Preliminary result)   Collection Time: 04/20/16  8:55 PM  Result Value Ref Range   Specimen Description BLOOD LEFT ANTECUBITAL    Special Requests AEROBIC BOTTLE ONLY 5CC    Culture NO GROWTH < 24 HOURS    Report Status PENDING   Urinalysis, Routine w reflex microscopic     Status: Abnormal   Collection Time: 04/20/16  9:17 PM  Result Value Ref Range   Color, Urine STRAW (A) YELLOW   APPearance TURBID (A) CLEAR   Specific Gravity, Urine 1.013 1.005 - 1.030   pH 5.0 5.0 - 8.0    Glucose, UA NEGATIVE NEGATIVE mg/dL   Hgb urine dipstick LARGE (A) NEGATIVE   Bilirubin Urine NEGATIVE NEGATIVE   Ketones, ur 5 (A) NEGATIVE mg/dL   Protein, ur 161 (A) NEGATIVE mg/dL   Nitrite NEGATIVE NEGATIVE   Leukocytes, UA SMALL (A) NEGATIVE   RBC / HPF TOO NUMEROUS TO COUNT 0 - 5 RBC/hpf   WBC, UA TOO NUMEROUS TO COUNT 0 - 5 WBC/hpf   Bacteria, UA MANY (A) NONE SEEN   Squamous Epithelial / LPF TOO NUMEROUS TO COUNT (A) NONE SEEN   WBC Clumps PRESENT   I-Stat CG4 Lactic Acid, ED     Status: Abnormal   Collection Time: 04/20/16  9:18 PM  Result Value Ref Range   Lactic Acid, Venous 1.99 (HH) 0.5 - 1.9 mmol/L  Lactic acid, plasma     Status: None   Collection Time: 04/21/16  2:16 AM  Result Value Ref Range   Lactic Acid, Venous 1.0 0.5 - 1.9 mmol/L  CBC     Status: Abnormal   Collection Time: 04/21/16  2:17 AM  Result Value Ref Range   WBC 16.1 (H) 4.0 - 10.5 K/uL   RBC 3.89 3.87 - 5.11 MIL/uL   Hemoglobin 10.8 (L) 12.0 - 15.0 g/dL   HCT 09.6 (L) 04.5 - 40.9 %   MCV 86.1 78.0 - 100.0 fL   MCH 27.8 26.0 - 34.0 pg   MCHC 32.2 30.0 - 36.0 g/dL   RDW 81.1 91.4 - 78.2 %   Platelets 547 (H) 150 - 400 K/uL  Basic metabolic panel     Status: Abnormal   Collection Time: 04/21/16  2:17 AM  Result Value Ref Range   Sodium 143 135 - 145 mmol/L   Potassium 3.2 (L) 3.5 - 5.1 mmol/L   Chloride 116 (H) 101 - 111 mmol/L   CO2 14 (L) 22 - 32 mmol/L   Glucose, Bld 112 (H) 65 - 99 mg/dL   BUN 30 (H) 6 - 20 mg/dL   Creatinine, Ser 9.56 (H) 0.44 - 1.00 mg/dL   Calcium 8.3 (L) 8.9 - 10.3 mg/dL   GFR calc non Af Amer 41 (L) >60 mL/min   GFR calc Af Amer 47 (L) >60 mL/min   Anion gap 13 5 - 15  Lactic acid, plasma     Status: None  Collection Time: 04/21/16  5:58 AM  Result Value Ref Range   Lactic Acid, Venous 0.8 0.5 - 1.9 mmol/L      Imaging Results (Last 48 hours)  Ct Abdomen Pelvis Wo Contrast  Result Date: 04/20/2016 CLINICAL  DATA:  Weakness and nausea beginning earlier this week and worsening. Patient fell today. Nausea, body aches, and abdominal pain. History of multiple sclerosis. EXAM: CT ABDOMEN AND PELVIS WITHOUT CONTRAST TECHNIQUE: Multidetector CT imaging of the abdomen and pelvis was performed following the standard protocol without IV contrast. COMPARISON:  None. FINDINGS: Lower chest: Lung bases are clear.  Small esophageal hiatal hernia. Hepatobiliary: No focal liver abnormality is seen. No gallstones, gallbladder wall thickening, or biliary dilatation. Pancreas: Unremarkable. No pancreatic ductal dilatation or surrounding inflammatory changes. Spleen: Normal in size without focal abnormality. Adrenals/Urinary Tract: No adrenal gland nodules. Kidneys are symmetrical. There is bilateral hydronephrosis and hydroureter. No stones or obstructing lesions demonstrated. Changes may be due to reflux or pyelonephritis. Bladder wall is diffusely thickened which may indicate cystitis. Moderate distention of the bladder. Asymmetric nodular thickening at the dome of the bladder with surrounding infiltration could represent a bladder diverticulum, hemorrhage, or eccentric lesion. Stomach/Bowel: Stomach, small bowel, and colon are mostly decompressed. No inflammatory wall thickening is appreciated although under distention may obscure inflammatory changes. Appendix is normal. Vascular/Lymphatic: No significant vascular findings are present. No enlarged abdominal or pelvic lymph nodes. Reproductive: Uterus is anteverted with nodular enlargement suggesting fibroids. Ovaries are not enlarged. Other: No free air or free fluid in the abdomen. Abdominal wall musculature appears intact. Musculoskeletal: In the uppermost images, at the T10-T11 interspace level, there is increased density material causing anterior effacement of the thecal sac and displacement of the thoracic cord. Material is contiguous with the disc space. This is incompletely  included within the field of view. This could represent calcified herniated disc material or could represent extruded cement from kyphoplasty if the patient has had this procedure done. No prior comparison studies of this area are available to indicate wound this represents acute or chronic process. Consider MRI for further evaluation of the thoracic spine. IMPRESSION: Bilateral hydronephrosis and hydroureter to the bladder. No obstructing stone or lesion identified. This could represent reflux or pyelonephritis. Diffuse thickening of the bladder wall suggesting cystitis. Asymmetric nodular thickening of the dome of the bladder with surrounding infiltration possibly representing bladder diverticulum, hemorrhage, or eccentric lesion. No evidence of bowel obstruction or inflammation. High density anterior extradural defect at T10-11, incompletely included. This could represent calcified herniated disc material or could represent extruded cement from kyphoplasty. Consider MRI for further evaluation if clinically indicated. Electronically Signed   By: Burman Nieves M.D.   On: 04/20/2016 23:45   Mr Brain Wo Contrast  Result Date: 04/21/2016 CLINICAL DATA:  56 year old female with multiple sclerosis. Presents with complaint of Generalized weakness, right upper extremity weakness. Initial encounter. EXAM: MRI HEAD WITHOUT CONTRAST TECHNIQUE: Multiplanar, multiecho pulse sequences of the brain and surrounding structures were obtained without intravenous contrast. COMPARISON:  Brain MRI 10/29/2015 and earlier. FINDINGS: Brain: Stable cerebral volume. No restricted diffusion to suggest acute infarction. No midline shift, mass effect, evidence of mass lesion, ventriculomegaly, extra-axial collection or acute intracranial hemorrhage. Cervicomedullary junction and pituitary are within normal limits. Patchy in scattered bilateral cerebral white matter T2 and FLAIR hyperintensity which is both periventricular and  subcortical appears stable since August 2017. Left greater than right temporal lobe white matter involvement again noted. Nodular lesions in the anterior frontal lobes appear  stable. Subcortical but no cerebral cortical involvement identified. Stable corpus callosum. Deep gray matter nuclei appear stable. Medial and lateral right thalamic involvement appears chronic (series 7, image 13). The brainstem appears stable and is not definitely affected. Confluent chronic left posterior cerebellar encephalomalacia is stable. No new signal abnormality identified. No IV contrast administered. No chronic cerebral blood products. Vascular: Major intracranial vascular flow voids are stable. Skull and upper cervical spine: Grossly stable. Visualized bone marrow signal is within normal limits. Sinuses/Orbits: Orbits soft tissues appear stable and normal. Visualized paranasal sinuses and mastoids are stable and well pneumatized. Other: Visible internal auditory structures appear normal. Negative scalp soft tissues. IMPRESSION: 1. Stable noncontrast MRI appearance of demyelinating disease since August 2017. 2. No new intracranial abnormality identified. Electronically Signed   By: Odessa Fleming M.D.   On: 04/21/2016 13:18   Mr Lumbar Spine Wo Contrast  Result Date: 04/21/2016 CLINICAL DATA:  56 year old with multiple sclerosis. Generalized weakness. Recent antibiotic therapy for urinary tract infection. EXAM: MRI LUMBAR SPINE WITHOUT CONTRAST TECHNIQUE: Multiplanar, multisequence MR imaging of the lumbar spine was performed. No intravenous contrast was administered. COMPARISON:  Abdominopelvic CT 04/20/2016.  Thoracic MRI 11/03/2015. FINDINGS: Segmentation: Conventional anatomy assumed, with the last open disc space designated L5-S1. Alignment: Stable mild convex left scoliosis. The lateral alignment is normal. Vertebrae: No worrisome osseous lesion, acute fracture or pars defect. The visualized sacroiliac joints appear  unremarkable. Conus medullaris: Extends to the L1-2 level and appears normal. Paraspinal and other soft tissues: No significant paraspinal findings. Persistent bilateral hydronephrosis and hydroureter as seen on recent abdominal CT. Bladder is not imaged on this study. Disc levels: Sagittal images extend from T11 through the mid sacrum. The abnormality at T10-11 seen on the abdominal CT is not imaged by this examination. Patient did have a central disc herniation with posterior osteophytes at T10-11 on prior thoracic MRI. The T11-12, T12-L1 and L1-2 disc space levels appear normal. L2-3: Mild disc desiccation, bulging and central annular fissure. No disc herniation or spinal stenosis. L3-4: Normal interspace. L4-5: Disc height and hydration are maintained. Mild bilateral facet hypertrophy. No spinal stenosis or nerve root encroachment. L5-S1: Disc height and hydration are maintained. Mild bilateral facet hypertrophy. No spinal stenosis or nerve root encroachment. IMPRESSION: 1. No acute findings demonstrated within the lumbar spine. 2. Mild disc degeneration at L2-3 without resulting spinal stenosis or nerve root encroachment. 3. The abnormality described at T10-11 on recent abdominopelvic CT is not included on this examination of the lumbar spine. Consider further evaluation with follow-up of thoracic MRI. Patient did have a disc herniation at that level on prior thoracic study done 11/03/2015. 4. Persistent bilateral hydronephrosis and hydroureter of undetermined etiology. Electronically Signed   By: Carey Bullocks M.D.   On: 04/21/2016 13:36   Dg Chest Port 1 View  Result Date: 04/20/2016 CLINICAL DATA:  Tachycardia, weakness and nausea EXAM: PORTABLE CHEST 1 VIEW COMPARISON:  Chest radiograph 10/29/2015 FINDINGS: The lungs are well inflated. Cardiomediastinal contours are normal. No pneumothorax or pleural effusion. No focal airspace consolidation or pulmonary edema. IMPRESSION: Clear lungs.  Electronically Signed   By: Deatra Robinson M.D.   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 Thoracic imaging reveals progression of disease and cervical imaging not performed.  1. Does the need for close, 24 hr/day medical supervision in concert with the patient's rehab needs make it unreasonable for this patient to be served in a less intensive setting? Yes  2. Co-Morbidities requiring supervision/potential complications: neurogenic bladder, urosepsis, ?neurogenic bowel, MS treatment and further assessment 3. Due to bladder management, bowel management, safety, skin/wound care, disease management, medication administration, pain management and patient education, does the patient require 24 hr/day rehab nursing? Yes 4. Does the patient require coordinated care of a physician, rehab nurse, PT (1-2 hrs/day, 5 days/week), OT (1-2 hrs/day, 5 days/week) and SLP (1-2 hrs/day, 5 days/week) to address physical and functional deficits in the context of the above medical diagnosis(es)? Yes Addressing deficits in the following areas: balance, endurance, locomotion, strength, transferring, bowel/bladder control, bathing, dressing, feeding, grooming, toileting, cognition and psychosocial support 5. Can the patient actively participate in an intensive therapy program of at least 3 hrs of therapy per day at least 5 days per week? Yes 6. The potential for patient to make measurable gains while on inpatient rehab is good 7. Anticipated functional outcomes upon discharge from inpatient rehab are supervision and min assist  with PT, supervision and min assist with OT, modified independent and supervision with SLP. 8. Estimated rehab length of stay to reach the above functional goals is: 17-22 days 9. Does the patient have adequate social supports and living environment to accommodate these discharge functional goals? Yes 10. Anticipated D/C setting:  Home 11. Anticipated post D/C treatments: HH therapy and Outpatient therapy 12. Overall Rehab/Functional Prognosis: excellent  RECOMMENDATIONS: This patient's condition is appropriate for continued rehabilitative care in the following setting: CIR Patient has agreed to participate in recommended program. Yes Note that insurance prior authorization may be required for reimbursement for recommended care.  Comment: Pt needs cervical MRI and formal neurological assessment. She has sustained substantial motor loss of the RUE and RLE (greater than LUE/LLE) with sensory loss, hyper-reflexia, and neurogenic bladder. (pt was ambulatory with walker one week ago). August 2017 MRI with notable cervical disease. Pt needs inpatient rehab most certainly, but first requires further work up and treatment plan regarding progression of her MS.  Have reached out to Dr. Thedore Mins to discuss.  Rehab Admissions Coordinator to follow up.  Thanks,  Crystal Oyster, MD, [place] [facility]    Crystal Oyster, MD 04/21/2016     Routing History

## 2016-04-24 NOTE — H&P (Signed)
Physical Medicine and Rehabilitation Admission H&P    Chief Complaint  Patient presents with  . Weakness  : HPI: [place] is a 56 y.o.Right-handed  female with hx of MS diagnosed 9/16 on Tecfidera followed by Dr.Seiter at [facility] neurology. Per chart review patient lives with spouse. Independent with assistive device/RW prior to admission and independent with ADLs. 2 level home with bedroom on first floor. Admitted 04/20/16 with complaints of progressive weakness/recent UTI completing Macrobid/urinary retention. Work up with CT of abdomen and pelvis revealed urosepsis  bilateral hydronephrosis. Creatinine 2.12. The patient was started on empiric rocephin and urology was consulted. Urology dxed neurogenic bladder and recommended ongoing abx, foley with plan voiding trial. Patient also was mild hematuria felt to be related to a combination of Foley catheter tubing UTI. Latest follow-up urine culture 04/23/2016 50,000 MRSA maintained on contact precautions and currently maintained on Vantin 200 mg twice a day initiated 04/24/2016.Marland Kitchen MRI of the brain showed stable appearance of demyelinating disease since August 2017 no new intracranial abnormality identified. 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 Await plan for MRI cervical spine. Renal function improved with gentle hydration latest creatinine 0.60. Patient remains on Dimethyl Fumarate for her multiple sclerosis. Tolerating a regular consistency diet.  Review of Systems  Constitutional: Negative for chills and fever.  HENT: Negative for hearing loss and tinnitus.   Eyes: Negative for blurred vision and double vision.  Respiratory: Negative for cough and shortness of breath.   Cardiovascular: Positive for leg swelling. Negative for chest pain and palpitations.  Gastrointestinal:  Positive for constipation. Negative for nausea and vomiting.  Genitourinary: Positive for dysuria and urgency.       Urinary retention  Musculoskeletal: Positive for joint pain and myalgias. Negative for falls.  Skin: Negative for rash.  Neurological: Positive for dizziness, sensory change and weakness. Negative for seizures.  Psychiatric/Behavioral:       Anxiety  All other systems reviewed and are negative.  Past Medical History:  Diagnosis Date  . Benign paroxysmal positional vertigo 10/12/2014  . MS (multiple sclerosis) ([place])    diagnosed 9/16   Past Surgical History:  Procedure Laterality Date  . OOPHORECTOMY Left    2013   History reviewed. No pertinent family history. Social History:  reports that she has never smoked. She has never used smokeless tobacco. She reports that she does not drink alcohol or use drugs. Allergies: No Known Allergies Medications Prior to Admission  Medication Sig Dispense Refill  . diazepam (VALIUM) 5 MG tablet Take 1 tablet (5 mg total) by mouth every 8 (eight) hours as needed for anxiety. (Patient taking differently: Take 5 mg by mouth every 12 (twelve) hours as needed for anxiety (for dizziness also). ) 90 tablet 3  . TECFIDERA 240 MG CPDR TAKE 1 CAPSULE BY MOUTH TWICE DAILY 60 capsule 10  . amphetamine-dextroamphetamine (ADDERALL XR) 15 MG 24 hr capsule Take 1 capsule by mouth every morning. (Patient not taking: Reported on 04/20/2016) 30 capsule 0  . cholecalciferol (VITAMIN D) 1000 UNITS tablet Take 1,000 Units by mouth daily. otc vit. d 5,000iu daily/fim    . CRANBERRY PO Take 1 capsule by mouth daily.    Marland Kitchen dimenhyDRINATE (DRAMAMINE) 50 MG tablet Take 50 mg by mouth every 8 (eight) hours as needed for dizziness.    . nitrofurantoin (MACRODANTIN) 100 MG capsule Take 100  mg by mouth 2 (two) times daily.    Marland Kitchen oxybutynin (DITROPAN-XL) 10 MG 24 hr tablet Take 1 tablet (10 mg total) by mouth at bedtime. (Patient not taking: Reported on 04/20/2016) 90  tablet 3    Home: [place] expects to be discharged to:: Private residence Living Arrangements: Spouse/significant other Available Help at Discharge: Family, Available 24 hours/day (works from home) Type of Home: House Home Access: Level entry [place]: Two level, Able to live on main level with bedroom/bathroom Bathroom Shower/Tub: Multimedia programmer: Handicapped height Bathroom Accessibility: Yes Home Equipment: [employer] - single point, Civil engineer, contracting, Environmental consultant - 2 wheels, Grab bars - tub/shower   Functional History: Prior Function Level of Independence: Independent with assistive device(s) Comments: Independent household ambulator with RW. Independent with ADLs.  Functional Status:  Mobility: Bed Mobility Overal bed mobility: Needs Assistance Bed Mobility: Supine to Sit Supine to sit: Max assist, +2 for physical assistance General bed mobility comments: Pt required assist to advance B LEs to edge of bed.  Pt then required assistance to elevate trunk.  Pt with minimal trace muscle contractions in efforts to advance to edge of bed.  OT used chux pad to advance patient to edge of bed in prep for transfer to chair.   Transfers Overall transfer level: Needs assistance Equipment used: Rolling walker (2 wheeled) Transfers: Squat Pivot Transfers Squat pivot transfers: Max assist, +2 physical assistance General transfer comment: Pt required blocking of B knees with use of chux pad to transfer from bed to chair via squat/stand pivot.  Pt performed with +2 external assist.     Ambulation/Gait Ambulation/Gait assistance:  (remains unable due to instability in R knee.  ) General Gait Details: unable    ADL: ADL Overall ADL's : Needs assistance/impaired Eating/Feeding: Set up, Sitting Grooming: Wash/dry face, Minimal assistance, Sitting Upper Body Bathing: Moderate assistance Lower Body Bathing: Maximal assistance Upper Body Dressing : Moderate  assistance Lower Body Dressing: Maximal assistance Toilet Transfer: +2 for physical assistance, Maximal assistance General ADL Comments: pt required total +2 and demonstrates decr trunk control at EOB. pt with anterior LOB. Pt with BIL UE on bed surface (A)ing  Cognition: Cognition Overall Cognitive Status: Within Functional Limits for tasks assessed Orientation Level: Oriented X4 Cognition Arousal/Alertness: Awake/alert Behavior During Therapy: WFL for tasks assessed/performed Overall Cognitive Status: Within Functional Limits for tasks assessed  Physical Exam: Blood pressure (!) 148/91, pulse 100, temperature 98.2 F (36.8 C), resp. rate 18, height _0  (1.651 m), weight 54.1 kg (119 lb 4.3 oz), last menstrual period 10/25/2015, SpO2 100 %. Physical Exam  Constitutional: No distress.  56 year old right-handed frail female  HENT:  Head: Normocephalic and atraumatic.  Eyes: EOM are normal. Left eye exhibits no discharge.  Pupils round and reactive to light  Neck: Normal range of motion. Neck supple. No tracheal deviation present. No thyromegaly present.  Cardiovascular: Normal rate, regular rhythm and normal heart sounds.  Exam reveals no gallop.   No murmur heard. Respiratory: No respiratory distress. She has no wheezes. She has no rales.  Good inspiratory effort  GI: Soft. Bowel sounds are normal. She exhibits no distension.  Musculoskeletal: She exhibits no edema.  Skin: Skin is warm and dry. She is not diaphoretic.  Skin. Warm and dry Neurological:  RUE 3-/5 prox to distal. RLE tr to 1/5 prox to distal. LUE 3-4/5. LLE 1+ to 2/5 also. Decreased LT and pain RUE and RLE. Near normal sensation LUE and perhaps 1+/2 LLE. DTR's 3+  throughout without resting tone. Cognitively displays reasonable insight and awareness. No focal CN findings. Speech clear. Normal language. Psych: pt is pleasant and appropriate.    Results for orders placed or performed during the hospital encounter of  04/20/16 (from the past 48 hour(s))  Glucose, capillary     Status: None   Collection Time: 04/22/16  8:52 AM  Result Value Ref Range   Glucose-Capillary 81 65 - 99 mg/dL  CBC     Status: Abnormal   Collection Time: 04/23/16  5:42 AM  Result Value Ref Range   WBC 13.8 (H) 4.0 - 10.5 K/uL   RBC 3.50 (L) 3.87 - 5.11 MIL/uL   Hemoglobin 9.8 (L) 12.0 - 15.0 g/dL   HCT 30.3 (L) 36.0 - 46.0 %   MCV 86.6 78.0 - 100.0 fL   MCH 28.0 26.0 - 34.0 pg   MCHC 32.3 30.0 - 36.0 g/dL   RDW 15.9 (H) 11.5 - 15.5 %   Platelets 471 (H) 150 - 400 K/uL  Basic metabolic panel     Status: Abnormal   Collection Time: 04/23/16  5:42 AM  Result Value Ref Range   Sodium 137 135 - 145 mmol/L   Potassium 3.7 3.5 - 5.1 mmol/L   Chloride 107 101 - 111 mmol/L   CO2 25 22 - 32 mmol/L   Glucose, Bld 99 65 - 99 mg/dL   BUN 9 6 - 20 mg/dL   Creatinine, Ser 0.60 0.44 - 1.00 mg/dL   Calcium 8.4 (L) 8.9 - 10.3 mg/dL   GFR calc non Af Amer >60 >60 mL/min   GFR calc Af Amer >60 >60 mL/min    Comment: (NOTE) The eGFR has been calculated using the CKD EPI equation. This calculation has not been validated in all clinical situations. eGFR's persistently <60 mL/min signify possible Chronic Kidney Disease.    Anion gap 5 5 - 15   No results found.     Medical Problem List and Plan: 1.  Tetraplegia secondary to MS exacerbation related to Urosepsis. Continue Dimethyl Fumarate for multiple sclerosis  -admit to inpatient rehab  -ordered MRI of cervical spine to complete work up given severity of upper and lower body symptoms. Thoracic MRI revealed progression of demyelinating disease throughout the thoracic cord. August 2017 cervical MRI revealed disease at multiple levels as well   -neuro consult  -tecfidera per home dose at present 2.  DVT Prophylaxis/Anticoagulation: SCDs. Check vascular study 3. Pain Management: Hydrocodone as needed 4. Mood: Valium 5 mg every 12 hours as needed anxiety 5. Neuropsych: This  patient is capable of making decisions on her own behalf. 6. Skin/Wound Care: Routine skin checks 7. Fluids/Electrolytes/Nutrition: Routine I&O with follow-up chemistries 8. Neurogenic bladder. Plan voiding trial. Follow-up urology services 9. MRSA UTI. Vantin 200 mg every 12 hours initiated 02/20/20184 more days. Continue contact precautions 10. Acute renal failure/bilateral hydronephrosis. Renal function improved and follow-up chemistries. Follow-up urology services   Post Admission Physician Evaluation: 1. Functional deficits secondary  to MS exacerbation. 2. Patient is admitted to receive collaborative, interdisciplinary care between the physiatrist, rehab nursing staff, and therapy team. 3. Patient's level of medical complexity and substantial therapy needs in context of that medical necessity cannot be provided at a lesser intensity of care such as a SNF. 4. Patient has experienced substantial functional loss from his/her baseline which was documented above under the "Functional History" and "Functional Status" headings.  Judging by the patient's diagnosis, physical exam, and functional history, the patient has  potential for functional progress which will result in measurable gains while on inpatient rehab.  These gains will be of substantial and practical use upon discharge  in facilitating mobility and self-care at the household level. 5. Physiatrist will provide 24 hour management of medical needs as well as oversight of the therapy plan/treatment and provide guidance as appropriate regarding the interaction of the two. 6. The Preadmission Screening has been reviewed and patient status is unchanged unless otherwise stated above. 7. 24 hour rehab nursing will assist with bladder management, bowel management, safety, skin/wound care, disease management, medication administration, pain management and patient education  and help integrate therapy concepts, techniques,education, etc. 8. PT will  assess and treat for/with: Lower extremity strength, range of motion, stamina, balance, functional mobility, safety, adaptive techniques and equipment, NMR, community reintegration, ego support.   Goals are: supervision to min assist. 9. OT will assess and treat for/with: ADL's, functional mobility, safety, upper extremity strength, adaptive techniques and equipment, NMR, family ed, community reintegration.   Goals are: supervision to min+ assist. Therapy may proceed with showering this patient. 10. SLP will assess and treat for/with: cognition, communication.  Goals are: mod I. 11. Case Management and Social Worker will assess and treat for psychological issues and discharge planning. 12. Team conference will be held weekly to assess progress toward goals and to determine barriers to discharge. 13. Patient will receive at least 3 hours of therapy per day at least 5 days per week. 14. ELOS: 14-20 days       15. Prognosis:  excellent     Meredith Staggers, MD, [facility] 04/24/2016  Cathlyn Parsons., PA-C 04/24/2016

## 2016-04-24 NOTE — Discharge Instructions (Signed)
Follow with Primary MD Duane Lope, MD in 7 days   Get CBC, CMP, 2 view Chest X ray checked  by Primary MD or SNF MD in 5-7 days ( we routinely change or add medications that can affect your baseline labs and fluid status, therefore we recommend that you get the mentioned basic workup next visit with your PCP, your PCP may decide not to get them or add new tests based on their clinical decision)  Activity: As tolerated with Full fall precautions use walker/cane & assistance as needed  Disposition CIR/SNF  Diet:  Heart Healthy with feeding assistance and aspiration precautions.  For Heart failure patients - Check your Weight same time everyday, if you gain over 2 pounds, or you develop in leg swelling, experience more shortness of breath or chest pain, call your Primary MD immediately. Follow Cardiac Low Salt Diet and 1.5 lit/day fluid restriction.  On your next visit with your primary care physician please Get Medicines reviewed and adjusted.  Please request your Prim.MD to go over all Hospital Tests and Procedure/Radiological results at the follow up, please get all Hospital records sent to your Prim MD by signing hospital release before you go home.  If you experience worsening of your admission symptoms, develop shortness of breath, life threatening emergency, suicidal or homicidal thoughts you must seek medical attention immediately by calling 911 or calling your MD immediately  if symptoms less severe.  You Must read complete instructions/literature along with all the possible adverse reactions/side effects for all the Medicines you take and that have been prescribed to you. Take any new Medicines after you have completely understood and accpet all the possible adverse reactions/side effects.   Do not drive, operate heavy machinery, perform activities at heights, swimming or participation in water activities or provide baby sitting services if your were admitted for syncope or siezures until  you have seen by Primary MD or a Neurologist and advised to do so again.  Do not drive when taking Pain medications.    Do not take more than prescribed Pain, Sleep and Anxiety Medications  Special Instructions: If you have smoked or chewed Tobacco  in the last 2 yrs please stop smoking, stop any regular Alcohol  and or any Recreational drug use.  Wear Seat belts while driving.   Please note  You were cared for by a hospitalist during your hospital stay. If you have any questions about your discharge medications or the care you received while you were in the hospital after you are discharged, you can call the unit and asked to speak with the hospitalist on call if the hospitalist that took care of you is not available. Once you are discharged, your primary care physician will handle any further medical issues. Please note that NO REFILLS for any discharge medications will be authorized once you are discharged, as it is imperative that you return to your primary care physician (or establish a relationship with a primary care physician if you do not have one) for your aftercare needs so that they can reassess your need for medications and monitor your lab values.

## 2016-04-24 NOTE — Progress Notes (Signed)
Pt alert and oriented in room.  MRI has called and is up now to take pt for a cervical spine MRI.  Pt's right side is weaker than left, but pt can move and feel in all her extremities.  Pt has tingling in both feet, with greater tingling in right foot.  Communicated pt's MRSA contact precaution status with transporter.  Pt has foley draining amber urine currently.  BP (!) 148/77 (BP Location: Left Arm)   Pulse (!) 115   Temp 98.4 F (36.9 C) (Oral)   Resp 18   Ht 5\' 3"  (1.6 m)   Wt 57.4 kg (126 lb 9.6 oz)   LMP 10/25/2015   SpO2 100%   BMI 22.43 kg/m   Tera Helper

## 2016-04-24 NOTE — Progress Notes (Signed)
Patient admitted about 1430. Patient's husband at bedside. Both oriented to rehab process, given rehab notebook and education started. All questions answered. Call bell and phone left within reach.

## 2016-04-24 NOTE — PMR Pre-admission (Signed)
PMR Admission Coordinator Pre-Admission Assessment  Patient: Crystal Park is an 56 y.o., female MRN: [id number] DOB: 09/16/1960 Height: 5' 5" (165.1 cm) Weight: 54.1 kg (119 lb 4.3 oz)              Insurance Information HMO:     PPO: yes     PCP:      IPA:      80/20:      OTHER:  PRIMARY: [employer]      Policy#: [id number]      Subscriber: spouse CM Name: Butch Penny      Phone#: [phone] ext [phone]     Fax#: EPIC access Pre-Cert#: [id number] approved from y days until 2/16 when updates are due to Raynald Blend phone [phone] fax [phone]      Employer: [employer] Benefits:  Phone #: [phone]     Name: 04/23/16 Eff. Date: 03/05/16     Deduct: $3200/met      Out of Pocket Max: $3750 ( met [phone])      Life Max: none CIR: 85 %      SNF: 85% 60 days Outpatient: 85%     Co-Pay: 60 visits combined Home Health: 85%      Co-Pay: unlimited DME: 85%     Co-Pay: 15% Providers: in network  SECONDARY: none       Medicaid Application Date:       Case Manager:  Disability Application Date:       Case Worker:   Emergency Facilities manager Information    Name Relation Home Work Mobile   Faten, Frieson [phone]  [phone]     Current Medical History  Patient Admitting Diagnosis: Tetraplegia related to MS exacerbation.   History of Present Illness: HPI: Crystal Park a 56 y.o.Right-handed femalewith hx of MS diagnosed 9/16 on [employer] followed by Dr.Seiter at [facility] neurology.  Admitted 04/20/16 with complaints of progressive weakness/recent UTI completing Macrobid/urinary retention. Work up with CT of abdomen and pelvis revealed urosepsis  bilateral hydronephrosis. Creatinine 2.12. The patient was started on empiric rocephin and urology was consulted. Urology dxed neurogenic bladder and recommended ongoing abx, foley with plan voiding trial. Patient also was mild hematuria felt to be related to a combination of Foley catheter tubing UTI. Latest  follow-up urine culture 04/23/2016 50,000 MRSA maintained on contact precautions and currently maintained on Vantin 200 mg twice a day initiated 04/24/2016.Marland Kitchen MRI of the brain showed stable appearance of demyelinating disease since August 2017 no new intracranial abnormality identified. 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 Await plan for MRI cervical spine. Renal function improved with gentle hydration latest creatinine 0.60. Patient remains on Dimethyl Fumarate for her multiple sclerosis. Tolerating a regular consistency diet.   Past Medical History  Past Medical History:  Diagnosis Date  . Benign paroxysmal positional vertigo 10/12/2014  . MS (multiple sclerosis) ([place])    diagnosed 9/16    Family History  family history is not on file.  Prior Rehab/Hospitalizations:  Has the patient had major surgery during 100 days prior to admission? No  Current Medications   Current Facility-Administered Medications:  .  albuterol (PROVENTIL) (2.5 MG/3ML) 0.083% nebulizer solution 2.5 mg, 2.5 mg, Nebulization, Q2H PRN, Norval Morton, MD .  cefpodoxime (VANTIN) tablet 200 mg, 200 mg, Oral, Q12H, Thurnell Lose, MD, 200 mg at 04/24/16 1040 .  diazepam (  VALIUM) tablet 5 mg, 5 mg, Oral, Q12H PRN, Rondell A Smith, MD, 5 mg at 04/22/16 0849 .  dimenhyDRINATE (DRAMAMINE) tablet 50 mg, 50 mg, Oral, Q8H PRN, Rondell A Smith, MD .  Dimethyl Fumarate CPDR 240 mg, 1 capsule, Oral, BID, Rondell A Smith, MD, 240 mg at 04/24/16 1040 .  feeding supplement (ENSURE ENLIVE) (ENSURE ENLIVE) liquid 237 mL, 237 mL, Oral, BID BM, Rondell A Smith, MD, 237 mL at 04/24/16 1040 .  HYDROcodone-acetaminophen (NORCO/VICODIN) 5-325 MG per tablet 1 tablet, 1 tablet, Oral, Q6H PRN, Rondell A Smith, MD, 1 tablet at 04/24/16 0808 .  MEDLINE mouth rinse, 15 mL, Mouth Rinse,  BID, Rondell A Smith, MD, 15 mL at 04/24/16 1041 .  morphine 2 MG/ML injection 2 mg, 2 mg, Intravenous, Q3H PRN, Rondell A Smith, MD, 2 mg at 04/22/16 2300 .  [DISCONTINUED] ondansetron (ZOFRAN) tablet 4 mg, 4 mg, Oral, Q6H PRN **OR** ondansetron (ZOFRAN) injection 4 mg, 4 mg, Intravenous, Q6H PRN, Rondell A Smith, MD  Facility-Administered Medications Ordered in Other Encounters:  .  gadopentetate dimeglumine (MAGNEVIST) injection 11 mL, 11 mL, Intravenous, Once PRN, Richard A Sater, MD .  gadopentetate dimeglumine (MAGNEVIST) injection 12 mL, 12 mL, Intravenous, Once PRN, Richard A Sater, MD  Patients Current Diet: Diet regular Room service appropriate? Yes; Fluid consistency: Thin  Precautions / Restrictions Precautions Precautions: Fall Restrictions Weight Bearing Restrictions: No   Has the patient had 2 or more falls or a fall with injury in the past year?No  Prior Activity Level Limited Community (1-2x/wk): Mod I RW pta. does not drive. Mod I with adls, warms food in microwave as needed when spouse out  Home Assistive Devices / Equipment Home Assistive Devices/Equipment: Shower chair without back, Walker (specify type), Cane (specify quad or straight), Bedside commode/3-in-1 Home Equipment: Cane - single point, Shower seat, Walker - 2 wheels, Grab bars - tub/shower  Prior Device Use: Indicate devices/aids used by the patient prior to current illness, exacerbation or injury? Walker  Prior Functional Level Prior Function Level of Independence: Independent with assistive device(s) Comments: Independent household ambulator with RW. Independent with ADLs.  Self Care: Did the patient need help bathing, dressing, using the toilet or eating?  Independent  Indoor Mobility: Did the patient need assistance with walking from room to room (with or without device)? Independent  Stairs: Did the patient need assistance with internal or external stairs (with or without device)?  Independent  Functional Cognition: Did the patient need help planning regular tasks such as shopping or remembering to take medications? Needed some help  Current Functional Level Cognition  Overall Cognitive Status: Within Functional Limits for tasks assessed Orientation Level: Oriented X4    Extremity Assessment (includes Sensation/Coordination)  Upper Extremity Assessment: RUE deficits/detail RUE Deficits / Details: generalized weakness shoulder flexion 90 degrees unable to sustain due to fatigue  Lower Extremity Assessment: Defer to PT evaluation RLE Deficits / Details: weaker side from MS, grossly </= 2/5 LLE Deficits / Details: grossly 3/5    ADLs  Overall ADL's : Needs assistance/impaired Eating/Feeding: Set up, Sitting Grooming: Wash/dry face, Minimal assistance, Sitting Upper Body Bathing: Moderate assistance Lower Body Bathing: Maximal assistance Upper Body Dressing : Moderate assistance Lower Body Dressing: Maximal assistance Toilet Transfer: +2 for physical assistance, Maximal assistance General ADL Comments: pt required total +2 and demonstrates decr trunk control at EOB. pt with anterior LOB. Pt with BIL UE on bed surface (A)ing    Mobility  Overal bed mobility: Needs Assistance Bed   Mobility: Supine to Sit Supine to sit: Max assist, +2 for physical assistance General bed mobility comments: Pt required assist to advance B LEs to edge of bed.  Pt then required assistance to elevate trunk.  Pt with minimal trace muscle contractions in efforts to advance to edge of bed.  PTA used chux pad to advance patient to edge of bed in prep for transfer to chair.  Pt presents with improved sitting baLance edge of bed.      Transfers  Overall transfer level: Needs assistance Equipment used: None, 2 person hand held assist Transfers: Stand Pivot Transfers Squat pivot transfers: Max assist, +2 physical assistance General transfer comment: Pt required blocking of R knee and use of  gait belt to transfer from bed to chair with stand pivot and shuffling steps.  Remains to require +2 external assist from PTA and rehab tech for safe transfer to chair from bed.      Ambulation / Gait / Stairs / Wheelchair Mobility  Ambulation/Gait Ambulation/Gait assistance:  (unable ) General Gait Details: unable    Posture / Balance Dynamic Sitting Balance Sitting balance - Comments: progressed to fair sitting edge of bed.   Balance Overall balance assessment: Needs assistance Sitting-balance support: Single extremity supported, Feet supported Sitting balance-Leahy Scale: Fair Sitting balance - Comments: progressed to fair sitting edge of bed.   Standing balance support: During functional activity, Single extremity supported Standing balance-Leahy Scale: Zero    Special needs/care consideration BiPAP/CPAP  N/a CPM  N/a Continuous Drip IV  N/a Dialysis  N/a Life Vest  N/a Oxygen  N/a Special Bed  N/a Trach Size  N/a Wound Vac (area)  N/a Skin intact Bowel mgmt: incontinent LBM 2/18. Continent pta Bladder mgmt: indwelling catheter Diabetic mgmt  N.a Contact precautions for MRSA   Previous Home Environment Living Arrangements: Spouse/significant other  Lives With: Spouse Available Help at Discharge: Family, Available 24 hours/day (spouse can work from home temporarily) Type of Home: House Home Layout: Two level, Able to live on main level with bedroom/bathroom Home Access: Level entry Bathroom Shower/Tub: Walk-in shower (1 inch step into shower; grab bars installed, pull down show) Bathroom Toilet: Handicapped height Bathroom Accessibility: Yes Home Care Services: No Additional Comments: new handicapped remodel to bathroom down stairs  Discharge Living Setting Plans for Discharge Living Setting: Patient's home, Lives with (comment) (spouse) Type of Home at Discharge: House Discharge Home Layout: Able to live on main level with bedroom/bathroom, Two level Discharge  Home Access: Stairs to enter Entrance Stairs-Rails: Right, Left, Can reach both Entrance Stairs-Number of Steps: 3-4 Discharge Bathroom Shower/Tub: Walk-in shower (with one instep into shower, grab bars and pull down shower ) Discharge Bathroom Toilet: Handicapped height Discharge Bathroom Accessibility: Yes How Accessible: Accessible via walker Does the patient have any problems obtaining your medications?: No  Renovations to bathroom occurring while pt hospitalized for handicapped accommodations  Social/Family/Support Systems Patient Roles: Spouse, Parent (two dauhgters) Contact Information: Andrew, spouse Anticipated Caregiver: spouse and local daughter Anticipated Caregiver's Contact Information: see above Ability/Limitations of Caregiver: spouse works for [employer] and can work from home temporarily Caregiver Availability: 24/7 Discharge Plan Discussed with Primary Caregiver: Yes Is Caregiver In Agreement with Plan?: Yes Does Caregiver/Family have Issues with Lodging/Transportation while Pt is in Rehab?: No  Goals/Additional Needs Patient/Family Goal for Rehab: supervision to min assist with PT and OT, supervision to Mod I with SLP Expected length of stay: ELOS 17-22 days Pt/Family Agrees to Admission and willing to participate: Yes Program Orientation   Provided & Reviewed with Pt/Caregiver Including Roles  & Responsibilities: Yes  Decrease burden of Care through IP rehab admission: n/a  Possible need for SNF placement upon discharge: not anticipated  Patient Condition: This patient's medical and functional status has changed since the consult dated: 04/22/2016 in which the Rehabilitation Physician determined and documented that the patient's condition is appropriate for intensive rehabilitative care in an inpatient rehabilitation facility. Medical changes are: Iv antibiotics to po for d/c.  Functional changes are: overall max assist transfers. After evaluating the patient today and  speaking with the Rehabilitation physician and acute team, the patient remains appropriate for inpatient rehab. Will admit to inpatient rehab today.  Preadmission Screen Completed By:  Cleatrice Burke, 04/24/2016 1:04 PM ______________________________________________________________________   Discussed status with Dr. Naaman Plummer on 04/24/2016 at 13-3 and received telephone approval for admission today.  Admission Coordinator:  Cleatrice Burke, time [id number] Date 04/24/2016

## 2016-04-25 ENCOUNTER — Inpatient Hospital Stay (HOSPITAL_COMMUNITY): Payer: Managed Care, Other (non HMO) | Admitting: Speech Pathology

## 2016-04-25 ENCOUNTER — Inpatient Hospital Stay (HOSPITAL_COMMUNITY): Payer: Managed Care, Other (non HMO) | Admitting: Occupational Therapy

## 2016-04-25 ENCOUNTER — Inpatient Hospital Stay (HOSPITAL_COMMUNITY): Payer: Managed Care, Other (non HMO) | Admitting: Physical Therapy

## 2016-04-25 ENCOUNTER — Inpatient Hospital Stay (HOSPITAL_COMMUNITY): Payer: Managed Care, Other (non HMO)

## 2016-04-25 DIAGNOSIS — D62 Acute posthemorrhagic anemia: Secondary | ICD-10-CM

## 2016-04-25 DIAGNOSIS — M7989 Other specified soft tissue disorders: Secondary | ICD-10-CM

## 2016-04-25 DIAGNOSIS — I10 Essential (primary) hypertension: Secondary | ICD-10-CM

## 2016-04-25 LAB — CBC WITH DIFFERENTIAL/PLATELET
Basophils Absolute: 0.1 10*3/uL (ref 0.0–0.1)
Basophils Relative: 1 %
Eosinophils Absolute: 0.5 10*3/uL (ref 0.0–0.7)
Eosinophils Relative: 6 %
HCT: 29.4 % — ABNORMAL LOW (ref 36.0–46.0)
Hemoglobin: 9.5 g/dL — ABNORMAL LOW (ref 12.0–15.0)
Lymphocytes Relative: 16 %
Lymphs Abs: 1.4 10*3/uL (ref 0.7–4.0)
MCH: 27.9 pg (ref 26.0–34.0)
MCHC: 32.3 g/dL (ref 30.0–36.0)
MCV: 86.2 fL (ref 78.0–100.0)
Monocytes Absolute: 1.3 10*3/uL — ABNORMAL HIGH (ref 0.1–1.0)
Monocytes Relative: 15 %
Neutro Abs: 5.5 10*3/uL (ref 1.7–7.7)
Neutrophils Relative %: 62 %
Platelets: 364 10*3/uL (ref 150–400)
RBC: 3.41 MIL/uL — ABNORMAL LOW (ref 3.87–5.11)
RDW: 15.2 % (ref 11.5–15.5)
WBC: 8.7 10*3/uL (ref 4.0–10.5)

## 2016-04-25 LAB — COMPREHENSIVE METABOLIC PANEL
ALT: 10 U/L — ABNORMAL LOW (ref 14–54)
AST: 15 U/L (ref 15–41)
Albumin: 2.4 g/dL — ABNORMAL LOW (ref 3.5–5.0)
Alkaline Phosphatase: 65 U/L (ref 38–126)
Anion gap: 11 (ref 5–15)
BUN: 15 mg/dL (ref 6–20)
CO2: 26 mmol/L (ref 22–32)
Calcium: 8.7 mg/dL — ABNORMAL LOW (ref 8.9–10.3)
Chloride: 101 mmol/L (ref 101–111)
Creatinine, Ser: 0.56 mg/dL (ref 0.44–1.00)
GFR calc Af Amer: >60 mL/min (ref >60)
GFR calc non Af Amer: >60 mL/min (ref >60)
Glucose, Bld: 95 mg/dL (ref 65–99)
Potassium: 3.7 mmol/L (ref 3.5–5.1)
Sodium: 138 mmol/L (ref 135–145)
Total Bilirubin: 0.3 mg/dL (ref 0.3–1.2)
Total Protein: 5.7 g/dL — ABNORMAL LOW (ref 6.5–8.1)

## 2016-04-25 LAB — CULTURE, BLOOD (ROUTINE X 2)
Culture: NO GROWTH
Culture: NO GROWTH

## 2016-04-25 NOTE — Evaluation (Signed)
Physical Therapy Assessment and Plan  Patient Details  Name: Crystal Park MRN: [id number] Date of Birth: 1960-05-05  PT Diagnosis: Abnormal posture, Abnormality of gait, Difficulty walking, Impaired sensation, Low back pain and Muscle weakness Rehab Potential: Good ELOS: 14-18 days   Today's Date: 04/25/2016 PT Individual Time: 1100-1203 PT Individual Time Calculation (min): 63 min    Problem List:  Patient Active Problem List   Diagnosis Date Noted  . Multiple sclerosis exacerbation ([place]) 04/24/2016  . Neurogenic bladder 04/24/2016  . Weakness of both arms   . Weakness of both legs   . Sepsis ([place]) 04/21/2016  . Bilateral hydronephrosis 04/21/2016  . ARF (acute renal failure) ([place]) 04/21/2016  . Sepsis secondary to UTI ([place]) 04/20/2016  . Attention deficit disorder 02/23/2016  . Gait disturbance 11/24/2015  . Other fatigue 11/24/2015  . Urinary urgency 11/24/2015  . Dehydration   . UTI (lower urinary tract infection) 10/29/2015  . Leukocytosis 10/29/2015  . Nausea and vomiting 10/29/2015  . MS (multiple sclerosis) ([place]) 10/26/2014    Past Medical History:  Past Medical History:  Diagnosis Date  . Benign paroxysmal positional vertigo 10/12/2014  . MS (multiple sclerosis) ([place])    diagnosed 9/16   Past Surgical History:  Past Surgical History:  Procedure Laterality Date  . OOPHORECTOMY Left    2013    Assessment & Plan. Crystal Park a 56 y.o.Right-handed femalewith hx of MSdiagnosed 9/16on [facility]ollowed by [place] neurology. Per chart review patient lives with spouse. Independent with assistive device/RWprior to admission and independent with ADLs. 2 level home with bedroom on first floor. Admitted 04/20/16 with complaints ofprogressiveweakness/recent UTIcompleting Macrobid/urinary retention. Work upwith CT of abdomen and pelvisrevealed urosepsis bilateral hydronephrosis.Creatinine 2.12.The patient was started on empiric  rocephin and urology was consulted. Urology dxed neurogenic bladder and recommended ongoing abx, foleywith plan voiding trial.Patient also was mild hematuria felt to be related to a combination of Foley catheter tubing UTI. Latest follow-up urine culture 04/23/2016 50,000 MRSA maintained on contact precautions and currently maintained on Vantin200 mg twice a day initiated 04/24/2016.Marland KitchenMRI of the brain showed stable appearance of demyelinating disease since August 2017 no new intracranial abnormality identified. 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 Await plan for MRI cervical spine. Renal function improved with gentle hydration latest creatinine 0.60. Patient remains on Dimethyl Fumaratefor her multiple sclerosis. Tolerating a regular consistency diet. Patient transferred to CIR on 04/24/2016 .   Patient currently requires max with mobility secondary to muscle weakness, decreased cardiorespiratoy endurance and decreased sitting balance, decreased standing balance, decreased postural control and decreased balance strategies.  Prior to hospitalization, patient was modified independent  with mobility and lived with Spouse in a House home.  Home access is 4 stepsStairs to enter.  Patient will benefit from skilled PT intervention to maximize safe functional mobility, minimize fall risk and decrease caregiver burden for planned discharge home with 24 hour supervision.  Anticipate patient will benefit from follow up [place] at discharge.  PT - End of Session Activity Tolerance: Tolerates < 10 min activity, no significant change in vital signs Endurance Deficit: Yes Endurance Deficit Description: multiple breaks were needed between transfers and tasks PT Assessment Rehab Potential (ACUTE/IP ONLY): Good PT Patient demonstrates impairments in the following  area(s): Balance;Endurance;Motor;Pain;Safety;Sensory PT Transfers Functional Problem(s): Bed Mobility;Bed to Chair;Car;Furniture PT Locomotion Functional Problem(s): Ambulation;Wheelchair Mobility;Stairs PT Plan PT Intensity: Minimum of  1-2 x/day ,45 to 90 minutes PT Frequency: 5 out of 7 days PT Duration Estimated Length of Stay: 14-18 days PT Treatment/Interventions: Ambulation/gait training;Balance/vestibular training;Community reintegration;Discharge planning;Disease management/prevention;DME/adaptive equipment instruction;Functional mobility training;Patient/family education;Pain management;Psychosocial support;Stair training;Splinting/orthotics;Therapeutic Activities;Therapeutic Exercise;UE/LE Strength taining/ROM;Wheelchair propulsion/positioning PT Transfers Anticipated Outcome(s): supervision with RW PT Locomotion Anticipated Outcome(s): supervision with RW PT Recommendation Recommendations for Other Services: Therapeutic Recreation consult;Neuropsych consult Therapeutic Recreation Interventions: Pet therapy;Kitchen group;Stress management Follow Up Recommendations: Home health PT Patient destination: Home Equipment Recommended: Wheelchair (measurements) Equipment Details: Pt owns RW and [facility]  Skilled Therapeutic Intervention  Pt was sitting up on commode upon arrival. Pt instructed pt through the evaluation then completed skilled intervention. Pt is max assist with transfers and bed mobility with progression to min A with transfers near end of evaluation. Pt required multiple breaks and extra time to complete tasks due to fatigue. Pt was educated on functional mobility tasks, room safety, POC, and rehab goals. Pt was returned to room and left lying in bed with [facility] elevated    PT Evaluation Precautions/Restrictions Precautions Precautions: Fall Restrictions Weight Bearing Restrictions: No General Chart Reviewed: Yes Family/Caregiver Present: No Vital Signs Pain Pain  Assessment Pain Assessment: 0-10 Pain Score: 5  Pain Type: Acute pain Pain Location: Back Pain Orientation: Mid Pain Descriptors / Indicators: Sore Pain Intervention(s): Rest;Repositioned Home Living/Prior Functioning Home Living Living Arrangements: Spouse/significant other Available Help at Discharge: Family;Available 24 hours/day Type of Home: House Home Access: Stairs to enter [employer] of Steps: 4 steps Entrance Stairs-Rails: Can reach both Home Layout: Two level;Able to live on main level with bedroom/bathroom Alternate Level Stairs-Number of Steps: 16 Alternate Level Stairs-Rails: Can reach both Bathroom Shower/Tub: Multimedia programmer: Handicapped height Bathroom Accessibility: Yes Additional Comments: new handicapped remodel to bathroom down stairs  Lives With: Spouse Prior Function Level of Independence: Requires assistive device for independence  Able to Take Stairs?: Yes Driving: No 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, museum) Leisure: Hobbies-yes (Comment) (read; ) Comments: Pt wants to walk safely with the RW again Vision/Perception  Baseline: wears glasses Wears glasses: At all times Patient Visual Report: no change from baseline    Cognition Overall Cognitive Status: Within Functional Limits for tasks assessed Arousal/Alertness: Awake/alert Orientation Level: Oriented X4 Memory: Impaired Sensation Sensation Light Touch: Impaired Detail Light Touch Impaired Details: Impaired RLE Hot/Cold: Appears Intact Proprioception: Appears Intact Coordination Gross Motor Movements are Fluid and Coordinated: No Fine Motor Movements are Fluid and Coordinated: Yes Motor  Motor Motor: Abnormal postural alignment and control  Mobility Bed Mobility Bed Mobility: Rolling Right;Rolling Left;Right Sidelying to Sit;Supine to Sit;Sit to Supine Rolling Right: 3: Mod assist Rolling Right Details: Manual facilitation for weight  shifting;Verbal cues for technique;Verbal cues for sequencing Rolling Left: 3: Mod assist Rolling Left Details: Manual facilitation for weight shifting;Verbal cues for technique;Verbal cues for sequencing Right Sidelying to Sit: 2: Max assist Right Sidelying to Sit Details: Manual facilitation for weight shifting;Verbal cues for technique;Verbal cues for sequencing;Manual facilitation for placement;Tactile cues for placement Supine to Sit: 2: Max assist Supine to Sit Details: Manual facilitation for weight shifting;Verbal cues for sequencing;Verbal cues for technique;Manual facilitation for placement;Tactile cues for placement Sit to Supine: 2: Max assist Sit to Supine - Details: Tactile cues for placement;Verbal cues for technique;Verbal cues for sequencing;Manual facilitation for weight shifting;Manual facilitation for placement Transfers Transfers: Yes Sit to Stand: 2: Max assist Sit to Stand Details: Manual facilitation for placement;Manual facilitation for weight shifting;Verbal cues for technique;Verbal cues for sequencing;Verbal cues for  precautions/safety Stand to Sit: 2: Max assist Stand to Sit Details (indicate cue type and reason): Verbal cues for sequencing;Verbal cues for technique;Manual facilitation for weight shifting;Manual facilitation for placement Stand Pivot Transfers: 2: Max assist Stand Pivot Transfer Details: Verbal cues for technique;Verbal cues for sequencing;Manual facilitation for weight shifting;Manual facilitation for placement;Verbal cues for safe use of DME/AE Lateral/Scoot Transfers: With slide board;3: Mod assist Lateral/Scoot Transfer Details: Tactile cues for placement;Verbal cues for technique;Verbal cues for precautions/safety;Verbal cues for sequencing;Verbal cues for safe use of DME/AE;Manual facilitation for weight shifting;Manual facilitation for placement Transfer via Lift Equipment: Stedy (CGA with stedy) Locomotion  Ambulation Ambulation:  No Gait Gait: No Stairs / Additional Locomotion Stairs: No Wheelchair Mobility Wheelchair Mobility: No  Trunk/Postural Assessment  Cervical Assessment Cervical Assessment: Exceptions to [facility] (forward head) Thoracic Assessment Thoracic Assessment: Exceptions to [facility] (increased kyphotic curvature) Lumbar Assessment Lumbar Assessment: Exceptions to [facility] (increased lumbar flexion; posterior pelvic tilt) Postural Control Postural Control: Deficits on evaluation  Balance Balance Balance Assessed: Yes Static Sitting Balance Static Sitting - Balance Support: Feet supported;Bilateral upper extremity supported Static Sitting - Level of Assistance: 5: Stand by assistance Static Standing Balance Static Standing - Balance Support: Bilateral upper extremity supported;During functional activity Static Standing - Level of Assistance: 4: Min assist Dynamic Standing Balance Dynamic Standing - Balance Support: Bilateral upper extremity supported;During functional activity Dynamic Standing - Level of Assistance: 2: Max assist Extremity Assessment  RUE Assessment RUE Assessment: Exceptions to [facility] RUE Strength RUE Overall Strength: Deficits RUE Overall Strength Comments: grossly 3+/5 LUE Assessment LUE Assessment: Exceptions to [facility] LUE Strength LUE Overall Strength: Deficits LUE Overall Strength Comments: grossly 3-/5 RLE Assessment RLE Assessment: Exceptions to [facility] RLE Strength RLE Overall Strength: Deficits Right Hip Flexion: 2+/5 Right Knee Flexion: 3+/5 Right Knee Extension: 3-/5 Right Ankle Dorsiflexion: 4-/5 Right Ankle Plantar Flexion: 4-/5 LLE Assessment LLE Assessment: Exceptions to [facility] LLE Strength Left Hip Flexion: 3+/5 Left Knee Flexion: 3+/5 Left Knee Extension: 3-/5 Left Ankle Dorsiflexion: 4-/5 Left Ankle Plantar Flexion: 4-/5   See Function Navigator for Current Functional Status.   Refer to Care Plan for Long Term Goals  Recommendations for other services:  Neuropsych and Therapeutic Recreation  Pet therapy, Kitchen group and Stress management  Discharge Criteria: Patient will be discharged from PT if patient refuses treatment 3 consecutive times without medical reason, if treatment goals not met, if there is a change in medical status, if patient makes no progress towards goals or if patient is discharged from hospital.  The above assessment, treatment plan, treatment alternatives and goals were discussed and mutually agreed upon: by patient  Rosendo Gros 04/25/2016, 1:00 PM

## 2016-04-25 NOTE — Evaluation (Signed)
Speech Language Pathology Assessment and Plan  Patient Details  Name: Crystal Park MRN: [id number] Date of Birth: [birth year]  SLP Diagnosis: N/A Rehab Potential: N/A ELOS: N/A    Today's Date: 04/25/2016 SLP Individual Time: 5364-6803 SLP Individual Time Calculation (min): 60 min   Problem List:  Patient Active Problem List   Diagnosis Date Noted  . Acute blood loss anemia   . Reactive hypertension   . Multiple sclerosis exacerbation ([place]) 04/24/2016  . Neurogenic bladder 04/24/2016  . Weakness of both arms   . Weakness of both legs   . Sepsis ([place]) 04/21/2016  . Bilateral hydronephrosis 04/21/2016  . ARF (acute renal failure) ([place]) 04/21/2016  . Sepsis secondary to UTI ([place]) 04/20/2016  . Attention deficit disorder 02/23/2016  . Gait disturbance 11/24/2015  . Other fatigue 11/24/2015  . Urinary urgency 11/24/2015  . Dehydration   . UTI (lower urinary tract infection) 10/29/2015  . Leukocytosis 10/29/2015  . Nausea and vomiting 10/29/2015  . MS (multiple sclerosis) ([place]) 10/26/2014   Past Medical History:  Past Medical History:  Diagnosis Date  . Benign paroxysmal positional vertigo 10/12/2014  . MS (multiple sclerosis) ([place])    diagnosed 9/16   Past Surgical History:  Past Surgical History:  Procedure Laterality Date  . OOPHORECTOMY Left    2013    Assessment / Plan / Recommendation Clinical Impression Patient is a 56 y.o.Right-handed  female with hx of MS diagnosed 9/16 on Tecfidera followed by Dr.Seiter at [facility] neurology. Per chart review patient lives with spouse. Independent with assistive device/RW prior to admission and independent with ADLs. 2 level home with bedroom on first floor. Admitted 04/20/16 with complaints of progressive weakness/recent UTI completing Macrobid/urinary retention. Work up with CT of abdomen and pelvis revealed urosepsis  bilateral hydronephrosis. Creatinine 2.12. The patient was started on empiric rocephin and urology was  consulted. Urology dxed neurogenic bladder and recommended ongoing abx, foley with plan voiding trial. Patient also was mild hematuria felt to be related to a combination of Foley catheter tubing UTI. Latest follow-up urine culture 04/23/2016 50,000 MRSA maintained on contact precautions and currently maintained on Vantin 200 mg twice a day initiated 04/24/2016.Marland Kitchen MRI of the brain showed stable appearance of demyelinating disease since August 2017 no new intracranial abnormality identified. 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 Await plan for MRI cervical spine. Renal function improved with gentle hydration latest creatinine 0.60. Patient remains on Dimethyl Fumarate for her multiple sclerosis. Tolerating a regular consistency diet. Patient admitted to CIR on 04/24/16.  Patient was administered a cognitive-linguistic evaluation. Cognitive function appeared [facility] for all tasks assessed. Patient reports baseline memory impairments but also verbalized compensatory strategies she utilizes at home to maximize recall of functional information. Patient was administered the [facility] and scored 20/22 points with a score of 18 or above considered normal. Patient appears to be at her baseline level of cognitive functioning, therefore, skilled SLP intervention is not warranted at this time.     Skilled Therapeutic Interventions          Administered a cognitive-linguistic evaluation. Please see above for details. Provided education to the patient in regards to her current cognitive function and strategies to utilize at home to maximize recall. She verbalized understanding.    SLP Assessment  Patient does not need any further Speech Lanaguage Pathology Services    Recommendations  Oral Care  Recommendations: Oral care BID Recommendations for Other Services:  Neuropsych consult Patient destination: Home Follow up Recommendations: None Equipment Recommended: None recommended by SLP    SLP Frequency N/A  SLP Duration  SLP Intensity  SLP Treatment/Interventions N/A  N/A  N/A   Pain Pain Assessment Pain Assessment: 0-10 Pain Score: 5  Pain Type: Acute pain Pain Location: Back Pain Orientation: Mid Pain Descriptors / Indicators: Sore Pain Intervention(s): Rest;Repositioned  Prior Functioning Type of Home: House  Lives With: Spouse Available Help at Discharge: Family;Available 24 hours/day 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  Problem Solving Problem solving assist level: Solves complex problems: Recognizes & self-corrects  Memory Memory assist level: Recognizes or recalls 90% of the time/requires cueing < 10% of the time   Short Term Goals: N/A   Refer to Care Plan for Long Term Goals  Recommendations for other services: Neuropsych  Discharge Criteria: Patient will be discharged from SLP if patient refuses treatment 3 consecutive times without medical reason, if treatment goals not met, if there is a change in medical status, if patient makes no progress towards goals or if patient is discharged from hospital.  The above assessment, treatment plan, treatment alternatives and goals were discussed and mutually agreed upon: by patient  [name] 54/21/2018, 3:35 PM

## 2016-04-25 NOTE — Plan of Care (Signed)
Problem: RH BOWEL ELIMINATION Goal: RH STG MANAGE BOWEL WITH ASSISTANCE STG Manage Bowel with min Assistance.  Outcome: Progressing Pt will identify when she has to have bowel movement and call for assistance.

## 2016-04-25 NOTE — Progress Notes (Signed)
[place] PHYSICAL MEDICINE & REHABILITATION     PROGRESS NOTE  Subjective/Complaints:  Pt seen laying in bed this AM.  She slept well overnight and is ready for her first day of therapies.   ROS: Denies CP, SOB, N/V/D.  Objective: Vital Signs: Blood pressure (!) 148/69, pulse 92, temperature 98.5 F (36.9 C), temperature source Oral, resp. rate 18, height 5\' 3"  (1.6 m), weight 57.4 kg (126 lb 9.6 oz), last menstrual period 10/25/2015, SpO2 100 %. Mr Cervical Spine W Wo Contrast  Result Date: 04/24/2016 CLINICAL DATA:  56 y/o F; increased extremity weakness. History of multiple sclerosis. EXAM: MRI CERVICAL SPINE WITHOUT AND WITH CONTRAST TECHNIQUE: Multiplanar and multiecho pulse sequences of the cervical spine, to include the craniocervical junction and cervicothoracic junction, were obtained without and with intravenous contrast. CONTRAST:  12mL MULTIHANCE GADOBENATE DIMEGLUMINE 529 MG/ML IV SOLN COMPARISON:  10/29/2015 cervical MRI.  04/21/2016 MRI of the head. FINDINGS: Alignment: Straightening of cervical lordosis with slight reversal at the C2-3 level. No listhesis. Vertebrae: No fracture, evidence of discitis, or bone lesion. No abnormal enhancement. Cord: Multiple T2 hyperintense cervical cord lesions are stable in comparison with the prior cervical MRI new with the largest lesions at the C2, C4 and C7-T1 levels. No abnormal enhancement to suggest active demyelination. Posterior Fossa, vertebral arteries, paraspinal tissues: Lesions within the right brachium pontis and brainstem are grossly stable in comparison with prior cervical MRI and better characterized on prior MRI of the brain. Disc levels: Stable discogenic degenerative changes with small disc bulges at the C3 through C6 levels. Disc bulges efface the anterior thecal sac and contacts the anterior cord with minimal cord flattening. There is no significant canal stenosis or foraminal narrowing. IMPRESSION: 1. Cervical spine  demyelinating plaques are stable in comparison with the prior cervical MRI. No abnormal enhancement to suggest active demyelination. 2. Stable mild cervical spondylosis without significant foraminal narrowing or canal stenosis. Electronically Signed   By: Mitzi Hansen M.D.   On: 04/24/2016 22:07    Recent Labs  04/23/16 0542 04/25/16 0609  WBC 13.8* 8.7  HGB 9.8* 9.5*  HCT 30.3* 29.4*  PLT 471* 364    Recent Labs  04/23/16 0542 04/25/16 0609  NA 137 138  K 3.7 3.7  CL 107 101  GLUCOSE 99 95  BUN 9 15  CREATININE 0.60 0.56  CALCIUM 8.4* 8.7*   CBG (last 3)   Recent Labs  04/24/16 0815  GLUCAP 100*    Wt Readings from Last 3 Encounters:  04/24/16 57.4 kg (126 lb 9.6 oz)  04/21/16 54.1 kg (119 lb 4.3 oz)  02/23/16 59.2 kg (130 lb 8 oz)    Physical Exam:  BP (!) 148/69 (BP Location: Right Arm)   Pulse 92   Temp 98.5 F (36.9 C) (Oral)   Resp 18   Ht 5\' 3"  (1.6 m)   Wt 57.4 kg (126 lb 9.6 oz)   LMP 10/25/2015   SpO2 100%   BMI 22.43 kg/m  Constitutional: NAD. Vital signs reviewed. Well-developed.  HENT: Normocephalicand atraumatic.  Eyes: EOMI. No discharge.  Cardiovascular: Normal rate, regular rhythmand normal heart sounds. No JVD. Respiratory: No respiratory distress. Clear.  GI: Soft. Bowel sounds are normal.  Musculoskeletal: She exhibits no edema, no tenderness.  Neurological: Alert and oriented x3 with some confusion. RUE 4-/5prox to distal.  RLE 3+/5 prox to distal.  LUE 4/5 proximal to distal.  LLE 3/5 proximal to distal Sensation intact to light touch  Skin. Warm  and dry Psych: Pleasant and appropriate.    Assessment/Plan: 1. Functional deficits secondary to MS exacerbation which require 3+ hours per day of interdisciplinary therapy in a comprehensive inpatient rehab setting. Physiatrist is providing close team supervision and 24 hour management of active medical problems listed below. Physiatrist and rehab team continue to  assess barriers to discharge/monitor patient progress toward functional and medical goals.  Function:  Bathing Bathing position   Position: Shower  Bathing parts Body parts bathed by patient: Left arm, Right arm, Chest, Abdomen, Front perineal area, Buttocks Body parts bathed by helper: Right upper leg, Right lower leg, Left upper leg, Left lower leg, Back  Bathing assist Assist Level: Touching or steadying assistance(Pt > 75%)      Upper Body Dressing/Undressing Upper body dressing   What is the patient wearing?: Pull over shirt/dress     Pull over shirt/dress - Perfomed by patient: Thread/unthread right sleeve, Thread/unthread left sleeve Pull over shirt/dress - Perfomed by helper: Put head through opening, Pull shirt over trunk        Upper body assist Assist Level: Touching or steadying assistance(Pt > 75%)      Lower Body Dressing/Undressing Lower body dressing   What is the patient wearing?: Pants, Non-skid slipper socks       Pants- Performed by helper: Thread/unthread right pants leg, Thread/unthread left pants leg, Pull pants up/down   Non-skid slipper socks- Performed by helper: Don/doff right sock, Don/doff left sock                  Lower body assist Assist for lower body dressing: Touching or steadying assistance (Pt > 75%)      Toileting Toileting     Toileting steps completed by helper: Adjust clothing prior to toileting, Performs perineal hygiene, Adjust clothing after toileting Toileting Assistive Devices: Grab bar or rail  Toileting assist Assist level: Touching or steadying assistance (Pt.75%)   Transfers Chair/bed transfer     Chair/bed transfer assist level: 2 helpers Chair/bed transfer assistive device: Systems developer lift: Holiday representative Comprehension Comprehension assist level: Follows basic conversation/direction with no assist  Expression Expression  assist level: Expresses basic needs/ideas: With no assist  Social Interaction Social Interaction assist level: Interacts appropriately 90% of the time - Needs monitoring or encouragement for participation or interaction.  Problem Solving Problem solving assist level: Solves basic 90% of the time/requires cueing < 10% of the time  Memory Memory assist level: Recognizes or recalls 90% of the time/requires cueing < 10% of the time    Medical Problem List and Plan: 1. 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 Continue Dimethyl Fumarate for multiple sclerosis  Records reviewed, summated, imaging reviewed, multiple areas of demylination  Begin CIR -neuro consult -tecfidera per home dose at present 2. DVT Prophylaxis/Anticoagulation: SCDs.  Vascular study neg for DVT 3. Pain Management: Hydrocodone as needed 4. Mood: Valium 5 mg every 12 hours as needed anxiety 5. Neuropsych: This patient iscapable of making decisions on herown behalf. 6. Skin/Wound Care: Routine skin checks 7. Fluids/Electrolytes/Nutrition: Routine I&O s 8.Neurogenic bladder. Plan voiding trial. Follow-up urology services  Appears to be voiding 9.MRSA UTI. Vantin 200 mg every 12 hours initiated 02/20/20183 more days. Continue contact precautions 10.Acute renal failure/bilateral hydronephrosis.  Follow-up urology services  Cr WNL 2/21 11. HTN  Likely reactive  Monitor with increased mobility  12. ABLA  Hb 9.5 on 2/21  Cont to monitor   LOS (Days) 1 A FACE TO FACE EVALUATION WAS PERFORMED  Keven Soucy Karis Juba 04/25/2016 2:58 PM

## 2016-04-25 NOTE — Progress Notes (Signed)
Initial Nutrition Assessment  DOCUMENTATION CODES:   Not applicable  INTERVENTION:  Continue Ensure Enlive po BID, each supplement provides 350 kcal and 20 grams of protein.  Encourage adequate PO intake.   NUTRITION DIAGNOSIS:   Increased nutrient needs related to  (therapy) as evidenced by estimated needs.  GOAL:   Patient will meet greater than or equal to 90% of their needs  MONITOR:   PO intake, Supplement acceptance, Labs, Weight trends, Skin, I & O's  REASON FOR ASSESSMENT:   Malnutrition Screening Tool    ASSESSMENT:   56 y.o.Right-handed  female with hx of MS diagnosed 9/16. Admitted 04/20/16 with complaints of progressive weakness/recent UTI completing Macrobid/urinary retention. Work up with CT of abdomen and pelvis revealed urosepsis  bilateral hydronephrosis.  Urology dxed neurogenic bladder and recommended ongoing abx, foley with plan voiding trial. Patient also was mild hematuria felt to be related to a combination of Foley catheter tubing UTI. 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  Pt reports having a decreased appetite however reports still consuming her food at meals. Meal completion has been 50-75%. Pt reports consuming at least 3 meals a day and says she has been "making" herself eat at meals. Weight has been stable per weight records. Pt currently has Ensure ordered and has been consuming them. RD to continue with current orders to aid in caloric and protein needs. Pt educated on the importance of adequate protein intake.  Nutrition-Focused physical exam completed. Findings are no fat depletion, moderate muscle depletion, and mild edema.   Labs and medications reviewed.   Diet Order:  Diet regular Room service appropriate? Yes; Fluid consistency: Thin  Skin:  Reviewed, no issues  Last BM:   2/20  Height:   Ht Readings from Last 1 Encounters:  04/24/16 5\' 3"  (1.6 m)    Weight:   Wt Readings from Last 1 Encounters:  04/24/16 126 lb 9.6 oz (57.4 kg)    Ideal Body Weight:  52.27 kg  BMI:  Body mass index is 22.43 kg/m.  Estimated Nutritional Needs:   Kcal:  1550-1750  Protein:  70-80 grams  Fluid:  1.5 - 1.7 L/day  EDUCATION NEEDS:   Education needs addressed  Roslyn Smiling, MS, RD, LDN Pager # [phone] After hours/ weekend pager # [phone]

## 2016-04-25 NOTE — Progress Notes (Signed)
Social Work Assessment and Plan Social Work Assessment and Plan  Patient Details  Name: Crystal Park MRN: [id number] Date of Birth: March 18, 1960  Today's Date: 04/25/2016  Problem List:  Patient Active Problem List   Diagnosis Date Noted  . Acute blood loss anemia   . Reactive hypertension   . Multiple sclerosis exacerbation (HCC) 04/24/2016  . Neurogenic bladder 04/24/2016  . Weakness of both arms   . Weakness of both legs   . Sepsis (HCC) 04/21/2016  . Bilateral hydronephrosis 04/21/2016  . ARF (acute renal failure) (HCC) 04/21/2016  . Sepsis secondary to UTI (HCC) 04/20/2016  . Attention deficit disorder 02/23/2016  . Gait disturbance 11/24/2015  . Other fatigue 11/24/2015  . Urinary urgency 11/24/2015  . Dehydration   . UTI (lower urinary tract infection) 10/29/2015  . Leukocytosis 10/29/2015  . Nausea and vomiting 10/29/2015  . MS (multiple sclerosis) (HCC) 10/26/2014   Past Medical History:  Past Medical History:  Diagnosis Date  . Benign paroxysmal positional vertigo 10/12/2014  . MS (multiple sclerosis) (HCC)    diagnosed 9/16   Past Surgical History:  Past Surgical History:  Procedure Laterality Date  . OOPHORECTOMY Left    2013   Social History:  reports that she has never smoked. She has never used smokeless tobacco. She reports that she does not drink alcohol or use drugs.  Family / Support Systems Marital Status: Married Patient Roles: Spouse, Parent Spouse/Significant Other: Crystal Park [phone]  [phone] Children: two grown daughter's-local Other Supports: Friends Anticipated Caregiver: husand Ability/Limitations of Caregiver: husband can work temporary from [employer] for [employer] Caregiver Availability: 24/7 (short time) Family Dynamics: Close knit family that are there for one another. Pt has been dealing with this MS since 04/2014. She has had multiple UTI's and hospitalized several times. She is hopeful she will do well and get home soon, but  also wants to be doing for herself.  Social History Preferred language: English Religion: Lutheran Cultural Background: No issues Education: Automotive engineer educated Read: Yes Write: Yes Employment Status: Disabled Date Retired/Disabled/Unemployed: 2016 Fish farm manager Issues: No issues Guardian/Conservator: None-according to MD pt is capable of making her own decisions while here. Will also include husband if any decisions need to be made here per pt request.   Abuse/Neglect Physical Abuse: Denies Verbal Abuse: Denies Sexual Abuse: Denies Exploitation of patient/patient's resources: Denies Self-Neglect: Denies  Emotional Status Pt's affect, behavior adn adjustment status: Pt is motivated to do well here and get back home. She finally does feel better but is very deconditioned from her infection and now her therapies today. She wants to get back to using a rolling walker independently at home. Her husband can stay hoem for a short time and work from home. Recent Psychosocial Issues: other health issues-MS exacerbates her issues Pyschiatric History: History of anxiety takes medications for this and finds them helpful. Will ask therapy team if would benefit from seeing neuro-psych while here. She has delat with much since her MS diagnosis. Substance Abuse History: No issues  Patient / Family Perceptions, Expectations & Goals Pt/Family understanding of illness & functional limitations: Pt and husband can explain her condition and husband is usually here but has taken a break today and is catching up on his work at home. Both feel they have a good understanding of the treatment plan while here. Premorbid pt/family roles/activities: Wife, Mother, sister, grandmother, retiree, church member, etc Anticipated changes in roles/activities/participation: resume Pt/family expectations/goals: Pt states: " I want to be able to walk  with a rolling walker like I did before this."  Husband states:  " I hope she does well and can manage at home while I am working."  [employer]: Other (Comment) (been to [facility] before) Premorbid Home Care/DME Agencies: Other (Comment) (has rw, cane, tub seat) Transportation available at discharge: Husband and daughter Resource referrals recommended: Neuropsychology, Support group (specify)  Discharge Planning Living Arrangements: Spouse/significant other Support Systems: Spouse/significant other, Children, Other relatives, Friends/neighbors, Church/faith community Type of Residence: Private residence Insurance Resources: Media planner (specify) Counselling psychologist) Financial Resources: SSD, Family Support Financial Screen Referred: No Living Expenses: Own Money Management: Spouse, Patient Does the patient have any problems obtaining your medications?: No Home Management: Husband does the home management Patient/Family Preliminary Plans: Return home with husband who can work from home for a short time to be able to provide 24 hr care. Pt is pleased with the progress in therapies she made today and hopeful this will continue. Will await therapy team's evaluations and work on a safe discharge plan. Social Work Anticipated Follow Up Needs: HH/OP, Support Group  Clinical Impression Pleasant female who is motivated to do well here, but also very deconditioned due to UTI on top of her MS. She would like to get back to her baseline function of mod/i with rolling walker. Her husband and daughter's are very involved and supportive. Glad to be here over being in a nursing home. Will ask team if feel appropriate for neuro-psych this worker feels she would benefit. Aware team's evaluations.  Crystal Park 04/25/2016, 3:49 PM

## 2016-04-25 NOTE — Progress Notes (Signed)
*  PRELIMINARY RESULTS* Vascular Ultrasound Lower extremity venous duplex has been completed.  Preliminary findings: No evidence of DVT or baker's cyst.    Farrel Demark, RDMS, RVT  04/25/2016, 9:26 AM

## 2016-04-25 NOTE — Progress Notes (Signed)
Patient information reviewed and entered into eRehab system by Lateisha Thurlow, RN, CRRN, PPS Coordinator.  Information including medical coding and functional independence measure will be reviewed and updated through discharge.    

## 2016-04-25 NOTE — IPOC Note (Signed)
Overall Plan of Care [facility]) Patient Details Name: ARIEANNA PRESSEY MRN: [id number] DOB: 1960-06-16  Admitting Diagnosis: [place] related to [facility]  Hospital Problems: Principal Problem:   Multiple sclerosis exacerbation (HCC) Active Problems:   Sepsis secondary to UTI (HCC)   Neurogenic bladder   Weakness of both arms   Weakness of both legs   Acute blood loss anemia   Reactive hypertension     Functional Problem List: Nursing Bowel, Bladder, Pain, Skin Integrity  PT Balance, Behavior, Endurance, Motor, Pain, Safety, Sensory  OT Balance, Endurance  SLP    TR         Basic ADL's: OT Grooming, Bathing, Dressing, Toileting     Advanced  ADL's: OT       Transfers: PT Bed Mobility, Bed to Chair, Car, Occupational psychologist, Research scientist (life sciences): PT Ambulation, Educational psychologist, Psychologist, prison and probation services     Additional Impairments: OT None  SLP        TR      Anticipated Outcomes Item Anticipated Outcome  Self Feeding    Swallowing      Basic self-care  Mod I  Toileting  Mod I   Bathroom Transfers Supervision  Bowel/Bladder  min assist  Transfers  supervision with RW  Locomotion  Supervision with RW  Communication     Cognition     Pain  less than 3 out of 10  Safety/Judgment  min assist   Therapy Plan: PT Intensity: Minimum of 1-2 x/day ,45 to 90 minutes PT Frequency: 5 out of 7 days PT Duration Estimated Length of Stay: 14-18 days OT Intensity: Minimum of 1-2 x/day, 45 to 90 minutes OT Frequency: 5 out of 7 days OT Duration/Estimated Length of Stay: 14-18         Team Interventions: Nursing Interventions Bladder Management, Bowel Management, Pain Management, Disease Management/Prevention, Medication Management, Skin Care/Wound Management  PT interventions Ambulation/gait training, Warden/ranger, Community reintegration, Discharge planning, Disease management/prevention, Fish farm manager, Functional mobility training,  Patient/family education, Pain management, Psychosocial support, Stair training, Splinting/orthotics, Therapeutic Activities, Therapeutic Exercise, UE/LE Strength taining/ROM, Wheelchair propulsion/positioning  OT Interventions Warden/ranger, Cognitive remediation/compensation, Firefighter, Discharge planning, Disease mangement/prevention, Fish farm manager, Patient/family education, Self Care/advanced ADL retraining, Therapeutic Activities, Therapeutic Exercise, UE/LE Strength taining/ROM, UE/LE Coordination activities, Wheelchair propulsion/positioning  SLP Interventions    TR Interventions    SW/CM Interventions      Team Discharge Planning: Destination: PT-Home ,OT- Home , SLP-  Projected Follow-up: PT-Home health PT, OT-  Home health OT, SLP-  Projected Equipment Needs: PT-None recommended by PT, Wheelchair (measurements), OT- To be determined, SLP-  Equipment Details: PT-Pt owns RW and SPC, OT-  Patient/family involved in discharge planning: PT- Patient,  OT-Patient, SLP-   MD ELOS: 13-16 days. Medical Rehab Prognosis:  Good Assessment:  56 y.o.Right-handed femalewith hx of MSdiagnosed 9/16on Tecfiderafollowed by Dr.Seiterat [place] neurology. Independent with assistive device/RWprior to admission and independent with ADLs. Admitted 04/20/16 with complaints ofprogressiveweakness/recent UTIcompleting Macrobid/urinary retention. Work upwith CT of abdomen and pelvisrevealed urosepsis bilateral hydronephrosis.Creatinine 2.12.The patient was started on empiric rocephin and urology was consulted. Urology dced neurogenic bladder and recommended ongoing abx, foleywith plan voiding trial.Patient also was mild hematuria felt to be related to a combination of Foley catheter tubing UTI. Latest follow-up urine culture 04/23/2016 50,000 MRSA maintained on contact precautions and currently maintained on Vantin200 mg twice a day initiated  04/24/2016.Marland KitchenMRI of the brain showed stable appearance of demyelinating disease since August 2017 no new  intracranial abnormality identified. 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 MRI cervical spine with stable plaques. Renal function improved with gentle hydration. Patient remains on Dimethyl Fumaratefor her multiple sclerosis. Pt with resulting deficits with gait, endurance, self-care.  Will set goals for Supervision with PT most tasks and Min with toileting with OT.   See Team Conference Notes for weekly updates to the plan of care

## 2016-04-25 NOTE — Care Management Note (Signed)
Inpatient Rehabilitation Center Individual Statement of Services  Patient Name:  Crystal Park  Date:  04/25/2016  Welcome to the Inpatient Rehabilitation Center.  Our goal is to provide you with an individualized program based on your diagnosis and situation, designed to meet your specific needs.  With this comprehensive rehabilitation program, you will be expected to participate in at least 3 hours of rehabilitation therapies Monday-Friday, with modified therapy programming on the weekends.  Your rehabilitation program will include the following services:  Physical Therapy (PT), Occupational Therapy (OT), Speech Therapy (ST), 24 hour per day rehabilitation nursing, Neuropsychology, Case Management (Social Worker), Rehabilitation Medicine, Nutrition Services and Pharmacy Services  Weekly team conferences will be held on Wednesday to discuss your progress.  Your Social Worker will talk with you frequently to get your input and to update you on team discussions.  Team conferences with you and your family in attendance may also be held.  Expected length of stay: 14-18 days Overall anticipated outcome: supervision-mod/i level  Depending on your progress and recovery, your program may change. Your Social Worker will coordinate services and will keep you informed of any changes. Your Social Worker's name and contact numbers are listed  below.  The following services may also be recommended but are not provided by the Inpatient Rehabilitation Center:    Home Health Rehabiltiation Services  Outpatient Rehabilitation Services    Arrangements will be made to provide these services after discharge if needed.  Arrangements include referral to agencies that provide these services.  Your insurance has been verified to be:  [place] Your primary doctor is:  Duane Lope  Pertinent information will be shared with your doctor and your insurance company.  Social Worker:  Dossie Der, SW [phone] or  ([phone]  Information discussed with and copy given to patient by: [name], 04/25/2016, 3:35 PM

## 2016-04-25 NOTE — Evaluation (Signed)
Occupational Therapy Assessment and Plan  Patient Details  Name: LAKECHIA NAY MRN: [id number] Date of Birth: [birth year]  OT Diagnosis: muscle weakness (generalized) Rehab Potential: Rehab Potential (ACUTE ONLY): Good ELOS: 14-18   Today's Date: 04/25/2016  Session 1 OT Individual Time: 1000-1100 OT Individual Time Calculation (min): 60 min     Session 2 OT Individual Time: 1433-1500 OT Individual Time Calculation (min): 27 min     Problem List:  Patient Active Problem List   Diagnosis Date Noted  . Acute blood loss anemia   . Reactive hypertension   . Multiple sclerosis exacerbation ([place]) 04/24/2016  . Neurogenic bladder 04/24/2016  . Weakness of both arms   . Weakness of both legs   . Sepsis ([place]) 04/21/2016  . Bilateral hydronephrosis 04/21/2016  . ARF (acute renal failure) ([place]) 04/21/2016  . Sepsis secondary to UTI ([place]) 04/20/2016  . Attention deficit disorder 02/23/2016  . Gait disturbance 11/24/2015  . Other fatigue 11/24/2015  . Urinary urgency 11/24/2015  . Dehydration   . UTI (lower urinary tract infection) 10/29/2015  . Leukocytosis 10/29/2015  . Nausea and vomiting 10/29/2015  . MS (multiple sclerosis) ([place]) 10/26/2014    Past Medical History:  Past Medical History:  Diagnosis Date  . Benign paroxysmal positional vertigo 10/12/2014  . MS (multiple sclerosis) ([place])    diagnosed 9/16   Past Surgical History:  Past Surgical History:  Procedure Laterality Date  . OOPHORECTOMY Left    2013    Assessment & Plan Clinical Impression: Patient is a 56 y.o. year old female with recent admission to the hospital on  04/20/16 with complaints of weakness/recent UTI. Work up revealed urosepsis with acute rental failure and bilateral hydronephrosis. The patient was started on empiric rocephin and urology was consulted. Urology dxed neurogenic bladder and recommended ongoing abx, foley. The patient may need stent placement as well. Pt displaying functional  deficits with therapy and PM&R was consulted to assess for potential rehab needs moving forward. Brain and lumbar MRI performed yesterday were negative for progressive disease. However, thoracic imaging notable for progression of demyelination. Cervical MRI not performed. Patient transferred to CIR on 04/24/2016 .    Patient currently requires max with basic self-care skills secondary to muscle weakness and low activity tolerance, and decreased standing balance, and decreased balance strategies.  Prior to hospitalization, patient could complete basic ADL with modified independent .  Patient will benefit from skilled intervention to decrease level of assist with basic self-care skills prior to discharge home with care partner.  Anticipate patient will require 24 hour supervision and follow up home health.  OT - End of Session Endurance Deficit: Yes Endurance Deficit Description: Multiple rest breaks required during bathing/dressing session OT Assessment Rehab Potential (ACUTE ONLY): Good OT Patient demonstrates impairments in the following area(s): Balance;Endurance OT Basic ADL's Functional Problem(s): Grooming;Bathing;Dressing;Toileting OT Transfers Functional Problem(s): Toilet;Tub/Shower OT Additional Impairment(s): None OT Plan OT Intensity: Minimum of 1-2 x/day, 45 to 90 minutes OT Frequency: 5 out of 7 days OT Duration/Estimated Length of Stay: 14-18 OT Treatment/Interventions: Balance/vestibular training;Cognitive remediation/compensation;Community reintegration;Discharge planning;Disease mangement/prevention;DME/adaptive equipment instruction;Patient/family education;Self Care/advanced ADL retraining;Therapeutic Activities;Therapeutic Exercise;UE/LE Strength taining/ROM;UE/LE Coordination activities;Wheelchair propulsion/positioning OT Basic Self-Care Anticipated Outcome(s): Mod I OT Toileting Anticipated Outcome(s): Mod I OT Bathroom Transfers Anticipated Outcome(s): Supervision OT  Recommendation Recommendations for Other Services: Neuropsych consult Patient destination: Home Follow Up Recommendations: Home health OT Equipment Recommended: To be determined  Skilled Therapeutic Intervention Session 1 Initial eval completed with treatment provided to address functional transfers,  activity tolerance, and adapted bathing/dressing skills. Pt greeted in bed and agreeable to Ot. Therapy aid present for +2 assistance. Lateral scoot transfer from Bed>drop arm w/c w/ Max A +2. Pt with weak B LE's R greater than L. Squat-pivot transfer from wc>drop arm commode over regular toilwt w/ Max A +2. Pt successful w/ Bm, total A for peri-care and clothing management. Stedy used for sit<>stand Max +2 from toilet to transfer onto shower chair. Overall max A for bathing tasks with increased time and multiple rest breaks 2/2 fatigue. Pt seems to be able to do more than she thinks she can functionally, will work through this barrier.  Total A for LB dressing, max +2 sit<>stand to pull pants over hips using Stedy. Pt reported need for BM again and Stedy used to transfer back onto raised toilet. Pt left on toilet with Stedy and hand-off completed to PT.  Session 2 Pt asleep in bed upon OT arrival and agreeable to therapy. Worked on bed mobility, dynamic sitting balance, LB/UB there-ex, and establishing plan of care and OT goals. Educated pt on technique to hook legs on edge of bed to assist with sup<>sit, overall Min A with HOB elevated. Dynamic sitting balance activity incorporating reach and weight shifting. There ex 2 sets x 10 reps each. Ankle pumps, knee extension, and shoulder extension. Stand-step turn transfer to w/c with overall Mod A and left in wc with needs met and safety belt on.   OT Evaluation Precautions/Restrictions  Precautions Precautions: Fall Restrictions Weight Bearing Restrictions: No Pain Pain Assessment Pain Assessment: 0-10 Pain Score: 5  Pain Type: Acute pain Pain  Location: Back Pain Orientation: Mid Pain Descriptors / Indicators: Sore Pain Intervention(s): Rest;Repositioned Home Living/Prior Functioning Home Living Family/patient expects to be discharged to:: Private residence Living Arrangements: Spouse/significant other Available Help at Discharge: Family, Available 24 hours/day Type of Home: House Home Access: Stairs to enter [employer] of Steps: 4 steps Entrance Stairs-Rails: Can reach both Home Layout: Two level, Able to live on main level with bedroom/bathroom Alternate Level Stairs-Number of Steps: 16 Alternate Level Stairs-Rails: Can reach both Bathroom Shower/Tub: Multimedia programmer: Handicapped height Bathroom Accessibility: Yes Additional Comments: new handicapped remodel to bathroom down stairs  Lives With: Spouse IADL History Homemaking Responsibilities: No Prior Function Level of Independence: Requires assistive device for independence, Needs assistance with homemaking, Independent with basic ADLs  Able to Take Stairs?: Yes Driving: No 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, museum) Leisure: Hobbies-yes (Comment) (read; ) Comments: Pt wants to walk safely with the RW again ADL ADL ADL Comments: Please see functional navigator Vision/Perception  Vision- History Baseline Vision/History: Wears glasses Wears Glasses: At all times Patient Visual Report: No change from baseline  Cognition Overall Cognitive Status: Within Functional Limits for tasks assessed Arousal/Alertness: Awake/alert Orientation Level: Person;Place;Situation Person: Oriented Place: Oriented Situation: Oriented Year: 2018 Month: February Day of Week: Correct Memory: Impaired Immediate Memory Recall: Sock;Blue;Bed Memory Recall: Blue Memory Recall Blue: Without Cue Sensation Sensation Light Touch: Impaired Detail Light Touch Impaired Details: Impaired RLE Hot/Cold: Appears Intact Proprioception: Appears  Intact Coordination Gross Motor Movements are Fluid and Coordinated: No Fine Motor Movements are Fluid and Coordinated: Yes Motor  Motor Motor: Abnormal postural alignment and control Balance Dynamic Standing Balance Dynamic Standing - Balance Support: Bilateral upper extremity supported;During functional activity Dynamic Standing - Level of Assistance: 2: Max assist Extremity/Trunk Assessment RUE Assessment RUE Assessment: Exceptions to [facility] RUE Strength RUE Overall Strength: Deficits RUE Overall Strength Comments: grossly 3+/5  LUE Assessment LUE Assessment: Exceptions to [facility] LUE Strength LUE Overall Strength: Deficits LUE Overall Strength Comments: grossly 3-/5   See Function Navigator for Current Functional Status.   Refer to Care Plan for Long Term Goals  Recommendations for other services: Neuropsych   Discharge Criteria: Patient will be discharged from OT if patient refuses treatment 3 consecutive times without medical reason, if treatment goals not met, if there is a change in medical status, if patient makes no progress towards goals or if patient is discharged from hospital.  The above assessment, treatment plan, treatment alternatives and goals were discussed and mutually agreed upon: by patient  [name] 04/25/2016, 3:50 PM

## 2016-04-26 ENCOUNTER — Inpatient Hospital Stay (HOSPITAL_COMMUNITY): Payer: Managed Care, Other (non HMO) | Admitting: Physical Therapy

## 2016-04-26 ENCOUNTER — Inpatient Hospital Stay (HOSPITAL_COMMUNITY): Payer: Managed Care, Other (non HMO) | Admitting: Occupational Therapy

## 2016-04-26 DIAGNOSIS — G4459 Other complicated headache syndrome: Secondary | ICD-10-CM

## 2016-04-26 DIAGNOSIS — R112 Nausea with vomiting, unspecified: Secondary | ICD-10-CM

## 2016-04-26 LAB — URINALYSIS, ROUTINE W REFLEX MICROSCOPIC
Bilirubin Urine: NEGATIVE
Glucose, UA: NEGATIVE mg/dL
Ketones, ur: NEGATIVE mg/dL
Nitrite: NEGATIVE
Protein, ur: 100 mg/dL — AB
Specific Gravity, Urine: 1.015 (ref 1.005–1.030)
pH: 6 (ref 5.0–8.0)

## 2016-04-26 MED ORDER — ASPIRIN-ACETAMINOPHEN-CAFFEINE 250-250-65 MG PO TABS
1.0000 | ORAL_TABLET | Freq: Three times a day (TID) | ORAL | Status: DC | PRN
  Administered 2016-04-26 – 2016-05-04 (×6): 1 via ORAL
  Filled 2016-04-26 (×10): qty 1

## 2016-04-26 NOTE — Progress Notes (Signed)
[place] PHYSICAL MEDICINE & REHABILITATION     PROGRESS NOTE  Subjective/Complaints:  Pt seen laying in bed this AM with towel over her head.  She complains of headaches and nausea.  She states she usually feels this way prior to her period.  She states therapies went very well yesterday.   ROS:  +Headache, nausea. Denies CP, SOB, D.  Objective: Vital Signs: Blood pressure (!) 141/89, pulse (!) 104, temperature 98.5 F (36.9 C), temperature source Oral, resp. rate 18, height 5\' 3"  (1.6 m), weight 57.4 kg (126 lb 9.6 oz), last menstrual period 10/25/2015, SpO2 98 %. Mr Cervical Spine W Wo Contrast  Result Date: 04/24/2016 CLINICAL DATA:  56 y/o F; increased extremity weakness. History of multiple sclerosis. EXAM: MRI CERVICAL SPINE WITHOUT AND WITH CONTRAST TECHNIQUE: Multiplanar and multiecho pulse sequences of the cervical spine, to include the craniocervical junction and cervicothoracic junction, were obtained without and with intravenous contrast. CONTRAST:  3mL MULTIHANCE GADOBENATE DIMEGLUMINE 529 MG/ML IV SOLN COMPARISON:  10/29/2015 cervical MRI.  04/21/2016 MRI of the head. FINDINGS: Alignment: Straightening of cervical lordosis with slight reversal at the C2-3 level. No listhesis. Vertebrae: No fracture, evidence of discitis, or bone lesion. No abnormal enhancement. Cord: Multiple T2 hyperintense cervical cord lesions are stable in comparison with the prior cervical MRI new with the largest lesions at the C2, C4 and C7-T1 levels. No abnormal enhancement to suggest active demyelination. Posterior Fossa, vertebral arteries, paraspinal tissues: Lesions within the right brachium pontis and brainstem are grossly stable in comparison with prior cervical MRI and better characterized on prior MRI of the brain. Disc levels: Stable discogenic degenerative changes with small disc bulges at the C3 through C6 levels. Disc bulges efface the anterior thecal sac and contacts the anterior cord with minimal  cord flattening. There is no significant canal stenosis or foraminal narrowing. IMPRESSION: 1. Cervical spine demyelinating plaques are stable in comparison with the prior cervical MRI. No abnormal enhancement to suggest active demyelination. 2. Stable mild cervical spondylosis without significant foraminal narrowing or canal stenosis. Electronically Signed   By: Mitzi Hansen M.D.   On: 04/24/2016 54:07    Recent Labs  04/25/16 0609  WBC 8.7  HGB 9.5*  HCT 29.4*  PLT 364    Recent Labs  04/25/16 0609  NA 138  K 3.7  CL 101  GLUCOSE 95  BUN 15  CREATININE 0.56  CALCIUM 8.7*   CBG (last 3)   Recent Labs  04/24/16 0815  GLUCAP 100*    Wt Readings from Last 3 Encounters:  04/24/16 57.4 kg (126 lb 9.6 oz)  04/21/16 54.1 kg (119 lb 4.3 oz)  02/23/16 59.2 kg (130 lb 8 oz)    Physical Exam:  BP (!) 141/89 (BP Location: Right Arm)   Pulse (!) 104   Temp 98.5 F (36.9 C) (Oral)   Resp 18   Ht 5\' 3"  (1.6 m)   Wt 57.4 kg (126 lb 9.6 oz)   LMP 10/25/2015   SpO2 98%   BMI 22.43 kg/m  Constitutional: NAD. Vital signs reviewed. Well-developed.  HENT: Normocephalicand atraumatic.  Eyes: EOMI. No discharge.  Cardiovascular: RRR. No JVD. Respiratory: Unlabored. Clear.  GI: Soft. Bowel sounds are normal.  Musculoskeletal: She exhibits no edema, no tenderness.  Neurological: Alert and oriented x3 with some confusion. RUE 4-/5prox to distal.  RLE 3/5 prox to distal.  LUE 4/5 proximal to distal.  LLE 3-/5 proximally, 3/5 distally  Skin. Warm and dry Psych: Pleasant and  appropriate.   Assessment/Plan: 1. Functional deficits secondary to MS exacerbation which require 3+ hours per day of interdisciplinary therapy in a comprehensive inpatient rehab setting. Physiatrist is providing close team supervision and 24 hour management of active medical problems listed below. Physiatrist and rehab team continue to assess barriers to discharge/monitor patient progress  toward functional and medical goals.  Function:  Bathing Bathing position   Position: Shower  Bathing parts Body parts bathed by patient: Left arm, Right arm, Chest, Abdomen, Front perineal area, Buttocks Body parts bathed by helper: Right upper leg, Right lower leg, Left upper leg, Left lower leg, Back  Bathing assist Assist Level: Touching or steadying assistance(Pt > 75%)      Upper Body Dressing/Undressing Upper body dressing   What is the patient wearing?: Pull over shirt/dress     Pull over shirt/dress - Perfomed by patient: Thread/unthread right sleeve, Thread/unthread left sleeve Pull over shirt/dress - Perfomed by helper: Put head through opening, Pull shirt over trunk        Upper body assist Assist Level: Touching or steadying assistance(Pt > 75%)      Lower Body Dressing/Undressing Lower body dressing   What is the patient wearing?: Pants, Non-skid slipper socks       Pants- Performed by helper: Thread/unthread right pants leg, Thread/unthread left pants leg, Pull pants up/down   Non-skid slipper socks- Performed by helper: Don/doff right sock, Don/doff left sock                  Lower body assist Assist for lower body dressing: Touching or steadying assistance (Pt > 75%)      Toileting Toileting     Toileting steps completed by helper: Adjust clothing prior to toileting, Performs perineal hygiene, Adjust clothing after toileting Toileting Assistive Devices: Grab bar or rail  Toileting assist Assist level: Touching or steadying assistance (Pt.75%)   Transfers Chair/bed transfer   Chair/bed transfer method: Stand pivot Chair/bed transfer assist level: Maximal assist (Pt 25 - 49%/lift and lower) Chair/bed transfer assistive device: Biochemist, clinical lift: Landscape architect Ambulation activity did not occur: Safety/medical concerns         Wheelchair   Type: Manual      Cognition Comprehension Comprehension assist level:  Follows complex conversation/direction with extra time/assistive device  Expression Expression assist level: Expresses basic needs/ideas: With no assist  Social Interaction Social Interaction assist level: Interacts appropriately with others with medication or extra time (anti-anxiety, antidepressant).  Problem Solving Problem solving assist level: Solves complex problems: With extra time  Memory Memory assist level: Recognizes or recalls 90% of the time/requires cueing < 10% of the time    Medical Problem List and Plan: 1. 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 Continue Dimethyl Fumarate for multiple sclerosis  Cont CIR 2. DVT Prophylaxis/Anticoagulation: SCDs.    Vascular study neg for DVT 3. Pain Management: Hydrocodone as needed 4. Mood: Valium 5 mg every 12 hours as needed anxiety 5. Neuropsych: This patient iscapable of making decisions on herown behalf. 6. Skin/Wound Care: Routine skin checks 7. Fluids/Electrolytes/Nutrition: Routine I&O s 8.Neurogenic bladder. Follow-up urology services  Resolved with tea color urine  UA/Ucx ordered 9.MRSA UTI. Vantin 200 mg every 12 hours initiated 02/20/20182 more days. Continue contact precautions 10.Acute renal failure/bilateral hydronephrosis.  Follow-up urology services  Cr WNL 2/21 11. HTN  Likely reactive, stable  Monitor with increased mobility 12. ABLA  Hb 9.5 on 2/21  Cont to monitor 13. Headache  Excedrin ordered,  pt states this is effective at home 14. Nausea  Zofran PRN    LOS (Days) 2 A FACE TO FACE EVALUATION WAS PERFORMED  Teddie Curd Karis Juba 04/26/2016 8:44 AM

## 2016-04-26 NOTE — Plan of Care (Signed)
Problem: RH BLADDER ELIMINATION Goal: RH STG MANAGE BLADDER WITH ASSISTANCE STG Manage Bladder With total Assistance  Outcome: Progressing Pt will, if possible, regain ability to intentionally void without retention, or will adapt to use of indwelling or self-catheterization without infection.

## 2016-04-26 NOTE — Plan of Care (Signed)
Problem: RH BOWEL ELIMINATION Goal: RH STG MANAGE BOWEL W/MEDICATION W/ASSISTANCE STG Manage Bowel with Medication with min Assistance.  Outcome: Progressing Pt will be able to monitor her own bowel activity and request assistance and medication with minimal cueing.  Pt will become proactive in managing bowel health.

## 2016-04-26 NOTE — Patient Care Conference (Signed)
Inpatient RehabilitationTeam Conference and Plan of Care Update Date: 04/25/2016   Time: 2:30 PM    Patient Name: [place] Park      Medical Record Number: [id number]  Date of Birth: [birth year] Sex: Female         Room/Bed: 4W06C/4W06C-01 Payor Info: Payor: [employer] / Plan: CIGNA MANAGED / Product Type: *No Product type* /    Admitting Diagnosis: Tetraplesia related to MS  Admit Date/Time:  04/24/2016  3:55 PM Admission Comments: No comment available   Primary Diagnosis:  Multiple sclerosis exacerbation ([facility]) Principal Problem: Multiple sclerosis exacerbation [facility])  Patient Active Problem List   Diagnosis Date Noted  . Other complicated headache syndrome   . Acute blood loss anemia   . Reactive hypertension   . Multiple sclerosis exacerbation ([facility]) 04/24/2016  . Neurogenic bladder 04/24/2016  . Weakness of both arms   . Weakness of both legs   . Sepsis ([facility]) 04/21/2016  . Bilateral hydronephrosis 04/21/2016  . ARF (acute renal failure) ([facility]) 04/21/2016  . Sepsis secondary to UTI ([facility]) 04/20/2016  . Attention deficit disorder 02/23/2016  . Gait disturbance 11/24/2015  . Other fatigue 11/24/2015  . Urinary urgency 11/24/2015  . Dehydration   . UTI (lower urinary tract infection) 10/29/2015  . Leukocytosis 10/29/2015  . Nausea and vomiting 10/29/2015  . MS (multiple sclerosis) ([facility]) 10/26/2014    Expected Discharge Date:    Team Members Present: Physician leading conference: Dr. Maryla Morrow Social Worker Present: Dossie Der, LCSW Nurse Present: Carmie End, RN PT Present: Bayard Hugger, PT OT Present: Kearney Hard, OT SLP Present:  Othella Boyer [place]-SP) PPS Coordinator present : Tora Duck, RN, CRRN     Current Status/Progress Goal Weekly Team Focus  Medical   Tetraplegia secondary to MS exacerbation related to Urosepsis  Improve mobility, safety, AKI  See above   Bowel/Bladder   Pt had a large bowel movement 04/24/2016. Pt continent. Pt has an indwelling foley  due to neurogenic bladder currently.   No perineal or sacral breakdown, no incontinence, improved renal and bladder function.resolved UTI, no further infection  Perineal and foley care, hydration, monitoring bowel and bladder output   Swallow/Nutrition/ Hydration             ADL's   Max A overall  Supervision/mod I   LB strengthening, sit<>stand, activity tolerance, modified bathing/dressing, pt/family ed   Mobility     eval pending        Communication             Safety/Cognition/ Behavioral Observations  At baseline, no SLP needs   N/A  N/A   Pain   Pain focused in lower back currently. As high as 7/10.  Pain levels below 3 in order for pt to better participate in therapies and to rest.  Pain management through meds, positioning, alternative methods to have pain less than 3    Skin   Patient's skin has no breakdown currently, Allevyn dressing applied to sacral area but there is no breakdown there, some pinkness.  Pt's skin to remain intact by good nutritional intake, frequent position changes, keeping perineal area dry and clean.  Foley care, perineal care, position changes, monitoring bony prominences, barrier cream, moisturizer, and powder as needed.      *See Care Plan and progress notes for long and short-term goals.  Barriers to Discharge: Anxiety, neuogenic bladder, UTI, AKI, safety, mobility    Possible Resolutions to Barriers:  Therapies. abx for UTI, follow bladder function  Discharge Planning/Teaching Needs:    Home with husband who can work from home for a short time, to provide 24 hr supervision. Pt pleased with her first day of therapies here.      Team Discussion:  New eval-goals supervision level. MD treating UTI and working continence. Kidney injury monitoring this. No Speech follow up needed. Set discharge target next week at conference. Pt adjusting to rehab unit  Revisions to Treatment Plan:  New eval   Continued Need for Acute Rehabilitation Level of  Care: The patient requires daily medical management by a physician with specialized training in physical medicine and rehabilitation for the following conditions: Daily direction of a multidisciplinary physical rehabilitation program to ensure safe treatment while eliciting the highest outcome that is of practical value to the patient.: Yes Daily medical management of patient stability for increased activity during participation in an intensive rehabilitation regime.: Yes Daily analysis of laboratory values and/or radiology reports with any subsequent need for medication adjustment of medical intervention for : Neurological problems;Renal problems;Urological problems  [name] 04/26/2016, 9:59 AM

## 2016-04-26 NOTE — Progress Notes (Signed)
Occupational Therapy Session Note  Patient Details  Name: Crystal Park MRN: [id number] Date of Birth: 03-17-1960  Today's Date: 04/26/2016  Session 1 OT Individual Time: 0800-0900 OT Individual Time Calculation (min): 60 min   Session 2 OT Individual Time: 1500-1530 OT Individual Time Calculation (min): 30 min    Short Term Goals: Week 1:  OT Short Term Goal 1 (Week 1): Pt will complete LB dressing with overall Mod A OT Short Term Goal 2 (Week 1): Pt will tolerate 3 minutes of standing activity with Min A for balance OT Short Term Goal 3 (Week 1): Pt will complete squat-pivot toilet transfer with Mod A of 1 caregiver  Skilled Therapeutic Interventions/Progress Updates:  Session 1   Pt greeted in bed with wash cloth over forehead reporting abdominal soreness, nausea, and severe headache. With encouragement, pt agreeable to try to participate in OT . Pt reluctant to attempt any LB ADLs, very submissive to pain, mental barriers to how much she can actually do. OT applied large ACE wrap to abdomen for comfort and abdominal pain- pt reports improvement. Bed mobility complete with overall Min/mod A with max encouragement and verbal cues for technique, increased time. Pt tolerated sitting EOB for ~ 15 mins to eat a few bites of breakfast with encouragement to self-feed. Educated pt on the importance of therapy and trying to do as much as she can daily.  Pt demonstrates behaviors of learned helplessness and requires encouragement and affirmation. Pt returned to bed at end of session and left with call bell in reach and needs met.  Session 2 1:1 OT session focused on activity tolerance and modified UB and LB dressing. Pt greeted in bed reporting improved medical status this afternoon. Pt came to sitting EOB with Min A and VC for technique. UB dressing completed with overall set-up A. Mod A for LB dressing with assistance to thread B LE's through pant legs. Min A sit<>stand, then pt able to  remove unilateral UE from RW to pull pants over hips with CGA for balance. Pt took seated rest break, then completed stand-step-turn transfer to recliner w/ RW and min A. Pt left with needs met and call bell in reach.   Therapy Documentation Precautions:  Precautions Precautions: Fall Restrictions Weight Bearing Restrictions: No Pain: Pain Assessment Pain Assessment: 0-10 Pain Score: 8  Pain Type: Acute pain Pain Location: Abdomen Pain Orientation: Lower Pain Descriptors / Indicators: Sore Pain Onset: With Activity Pain Intervention(s): Other (Comment) (ace wrap "binder") ADL: ADL ADL Comments: Please see functional navigator  See Function Navigator for Current Functional Status.   Therapy/Group: Individual Therapy  [name] 04/26/2016, 3:42 PM

## 2016-04-26 NOTE — Progress Notes (Signed)
Physical Therapy Session Note  Patient Details  Name: Crystal Park MRN: [id number] Date of Birth: 1960-03-26  Today's Date: 04/26/2016 PT Individual Time: 6789-3810 and 1751-0258 PT Individual Time Calculation (min): 62 min and 45 min  Short Term Goals: Week 1:  PT Short Term Goal 1 (Week 1): Pt will ambulate 15 ft with RW min A PT Short Term Goal 2 (Week 1): Pt will perform supine <->sit with mod A  PT Short Term Goal 3 (Week 1): Pt will perform bed rolling min A  PT Short Term Goal 4 (Week 1): Pt will demonstrate chair/bed transfer min A 50% of time with min cues using RW  Skilled Therapeutic Interventions/Progress Updates:  Session 1:  Pt was supine in bed upon arrival. Pt demonstrated rolling to L and R mod A with verbal and tactile cues to hand placement and sequence. Pt required max A to sit at EOB with HOB elevated. Pt performed stand pivot and ambulatory transfers with RW supervision throughout therapy. Pt self-propelled in W/C supervision using BUE method 60 ft and  25 ft with extended rest break and required max verbal and tactile cues for handplacement and sequencing.  Pt performed 1 car transfer using RW and required mod assist to lift legs into car only while needing only supervision for all other components of the transfers. Pt ambulated 36 ft with RW supervision. Pt ascended and descended 8 three inch steps facing forward with CGA using a reciprocal gait pattern and BUE support. Pt required mod A to return to supine in bed with HOB elevated and use of bedrails. Pt was left in bed with all needs within reach. Pt continued to require max cues for encouragement and participation due to headache and needed frequent rest breaks throughout therapy.   Session 2 Pt was sitting up in recliner with legs elevated upon arrival. Pt was supervision with sit-to-stand and ambulatory transfer from recliner to W/C using RW . Pt performed all sit-to-stand transfers throughout therapy at  supervision level. Pt performed the TUG outcome measure with Rw supervision and scored 1 min, 20 seconds; 1 min, 9 seconds; and 1 min, 8 seconds on the 3 trial respectively. Pt's scores on the TUG indicates an increased risk of falling. Pt self-propelled W/C 50 ft using BUE method requiring min A cueing for sequencing and technique to push W/C straight and turn. Pt performed one bout of standing tolerance using RW while using the [employer]. Pt was supervision for the 60 second trial and scored 17 points. Pt was returned to room and left in nursing's care.    Therapy Documentation Precautions:  Precautions Precautions: Fall Restrictions Weight Bearing Restrictions: No Pain: Pain Assessment Pain Assessment: 0-10 Pain Score: 8  Pain Type: Acute pain Pain Location: Abdomen Pain Orientation: Lower Pain Descriptors / Indicators: Sore Pain Onset: With Activity Pain Intervention(s): Other (Comment) (ace wrap "binder")  Balance: Balance Balance Assessed: Yes Standardized Balance Assessment Standardized Balance Assessment: Timed Up and Go Test Timed Up and Go Test TUG: Normal TUG Normal TUG (seconds): 69   See Function Navigator for Current Functional Status.   Therapy/Group: Individual Therapy  Rudie Meyer 04/26/2016, 12:44 PM

## 2016-04-27 ENCOUNTER — Inpatient Hospital Stay (HOSPITAL_COMMUNITY): Payer: Managed Care, Other (non HMO) | Admitting: Occupational Therapy

## 2016-04-27 ENCOUNTER — Inpatient Hospital Stay (HOSPITAL_COMMUNITY): Payer: Managed Care, Other (non HMO) | Admitting: Physical Therapy

## 2016-04-27 DIAGNOSIS — N390 Urinary tract infection, site not specified: Secondary | ICD-10-CM

## 2016-04-27 LAB — URINE CULTURE: Culture: NO GROWTH

## 2016-04-27 MED ORDER — NITROFURANTOIN MONOHYD MACRO 100 MG PO CAPS
100.0000 mg | ORAL_CAPSULE | Freq: Two times a day (BID) | ORAL | Status: DC
  Administered 2016-04-27 – 2016-04-30 (×7): 100 mg via ORAL
  Filled 2016-04-27 (×7): qty 1

## 2016-04-27 NOTE — Progress Notes (Signed)
Occupational Therapy Session Note  Patient Details  Name: Crystal Park MRN: [id number] Date of Birth: Jun 26, 1960  Today's Date: 04/27/2016 Session 1 OT Individual Time: 1105-1200 OT Individual Time Calculation (min): 55 min   Session 2 OT Individual Time: 1445-1530 OT Individual Time Calculation (min): 45 min    Short Term Goals: Week 1:  OT Short Term Goal 1 (Week 1): Pt will complete LB dressing with overall Mod A OT Short Term Goal 2 (Week 1): Pt will tolerate 3 minutes of standing activity with Min A for balance OT Short Term Goal 3 (Week 1): Pt will complete squat-pivot toilet transfer with Mod A of 1 caregiver  Skilled Therapeutic Interventions/Progress Updates:  Session 1   1:1 OT session focused on increased activity tolerance, functional mobility, standing endurance,and standing balance. Pt greeted in bed with RN present to remove catheter. Pt felt immediate need to urinate after foley removed- bed mobility completed with supervision, sit<>stand RW with verbal cues for technique and supervision. Pt ambulated w/ RW to bathroom with Min A. Pt unable to void. Set-up A for washing buttocks and peri-area, then pt able to maintain standing balance with close supervision while pulling pants over hips. Pt requested abdominal ace wrap for comfort. Pt ambulated w/ RW to recliner w close supervision. Pt left with needs met.    Session 2 1:1 OT session focused on activity tolerance, UB/LB strengthening,   abdominal wrap, and improved sit<>stand in preparation for ADL tasks. Demonstrated technique for donning and doffing abdominal wrap for comfort-informed pt to remove wrap and TED hose at night- completed with min A and set-up. Pt declined to leave room this afternoon, agreeable to there-ex at bedside. 10 sit<>stands w. Rw, supervision, and min verbal cues for safe technique. Short rest breaks between each sit<>stand (~45 seconds), and extended rest break after 5 (~5 mins). Educated pt on  energy conservation techniques and affirmed pt as to how much she is actually doing for herself. UB there-ex using orange thera-band 2 sets of 10 bicep curls and triceps press. Multiple rest breaks required within reps. Pt left semi-reclined in bed at end of session with needs met and call bell in reach.  Therapy Documentation Precautions:  Precautions Precautions: Fall Restrictions Weight Bearing Restrictions: No Pain: Pain Assessment Pain Assessment: 0-10 Pain Score: 4  Faces Pain Scale: Hurts a little bit Pain Type: Chronic pain Pain Location: Abdomen Pain Intervention(s): Repositioned, abdominal wrap ADL: ADL ADL Comments: Please see functional navigator     See Function Navigator for Current Functional Status.   Therapy/Group: Individual Therapy  [name] 04/27/2016, 3:41 PM

## 2016-04-27 NOTE — Progress Notes (Signed)
Physical Therapy Session Note  Patient Details  Name: Crystal Park MRN: [id number] Date of Birth: [birth year]  Today's Date: 04/27/2016 PT Individual Time: 1610-1640 PT Individual Time Calculation (min): 30 min   Short Term Goals: Week 1:  PT Short Term Goal 1 (Week 1): Pt will ambulate 15 ft with RW min A PT Short Term Goal 2 (Week 1): Pt will perform supine <->sit with mod A  PT Short Term Goal 3 (Week 1): Pt will perform bed rolling min A  PT Short Term Goal 4 (Week 1): Pt will demonstrate chair/bed transfer min A 50% of time with min cues using RW  Skilled Therapeutic Interventions/Progress Updates:    c/o soreness/stiffness in BLEs and knees.  Session focus on LE stretching, positioning, and cold compress to knees for pain control.  PT provided PROM stretching to bilat hamstrings, glutes, hip flexors, piriformis, and heel cords.  3 sets of 30 seconds for each stretch.  PT applied ice packs to bilateral knees for pain control and instructed pt to remove ice packs after thirty minutes.  Left positioned in bed with call bell in reach and needs met.   Therapy Documentation Precautions:  Precautions Precautions: Fall Restrictions Weight Bearing Restrictions: No   See Function Navigator for Current Functional Status.   Therapy/Group: Individual Therapy  Crystal Park 04/27/2016, 5:24 PM

## 2016-04-27 NOTE — Progress Notes (Signed)
Physical Therapy Session Note  Patient Details  Name: Crystal Park MRN: [id number] Date of Birth: 01-05-1961  Today's Date: 04/27/2016 PT Individual Time: 0905-1000 PT Individual Time Calculation (min): 55 min   Short Term Goals: Week 1:  PT Short Term Goal 1 (Week 1): Pt will ambulate 15 ft with RW min A PT Short Term Goal 2 (Week 1): Pt will perform supine <->sit with mod A  PT Short Term Goal 3 (Week 1): Pt will perform bed rolling min A  PT Short Term Goal 4 (Week 1): Pt will demonstrate chair/bed transfer min A 50% of time with min cues using RW  Skilled Therapeutic Interventions/Progress Updates:  Pt was supine in bed upon arrival. Pt required min A to roll in bed both ways and sit up on EOB with bedrails, HOB elevated and verbal cues for sequence and handplacement. Pt performed sit-to-stand, ambulatory, and stand pivot transfers supervision with max cues for handplacement when lifting up out of chair and sitting down. Pt ambulated 60 ft and 35 ft using RW supervision. Pt performed seated therapeutic exercises, which include: 1x10 LAQ with 5 sec holds; 1 x 10 seated heel raises; 1 x 10 seated toe raises; 1 x 20 trunk isometrics with 5 sec holds using 1 # Dowel Rod; and 3 x 30 sec heel cord stretch on R ankle applied by PT . Pt continued to require frequent rest breaks and encouragement to complete tasks. Pt was returned to room and required min A to return to lying supine in bed to rest. Pt had all need within reach before leaving.      Therapy Documentation Precautions:  Precautions Precautions: Fall Restrictions Weight Bearing Restrictions: No Pain: Pain Assessment Pain Assessment: 0-10 Pain Score: 4  Pain Type: Chronic pain Pain Location: Back Pain Orientation: Lower Pain Descriptors / Indicators: Aching Pain Intervention(s): Rest;Repositioned   Other Treatments:     See Function Navigator for Current Functional Status.   Therapy/Group: Individual  Therapy  Rudie Meyer 04/27/2016, 12:34 PM

## 2016-04-27 NOTE — Progress Notes (Signed)
[place] PHYSICAL MEDICINE & REHABILITATION     PROGRESS NOTE  Subjective/Complaints:  Pt seen laying bed this AM.  She states she slept well and had a good day in therapies yesterday, however, this AM she is "not good".  She states "the entire middle of my body hurts after they took my blood pressure this morning".    ROS:  +Pain in middle of body. Denies CP, SOB, D.  Objective: Vital Signs: Blood pressure 137/75, pulse 99, temperature 98.4 F (36.9 C), temperature source Oral, resp. rate 18, height 5\' 3"  (1.6 m), weight 57.4 kg (126 lb 9.6 oz), last menstrual period 10/25/2015, SpO2 97 %. No results found.  Recent Labs  04/25/16 0609  WBC 8.7  HGB 9.5*  HCT 29.4*  PLT 364    Recent Labs  04/25/16 0609  NA 138  K 3.7  CL 101  GLUCOSE 95  BUN 15  CREATININE 0.56  CALCIUM 8.7*   CBG (last 3)  No results for input(s): GLUCAP in the last 72 hours.  Wt Readings from Last 3 Encounters:  04/24/16 57.4 kg (126 lb 9.6 oz)  04/21/16 54.1 kg (119 lb 4.3 oz)  02/23/16 59.2 kg (130 lb 8 oz)    Physical Exam:  BP 137/75 (BP Location: Left Arm)   Pulse 99   Temp 98.4 F (36.9 C) (Oral)   Resp 18   Ht 5\' 3"  (1.6 m)   Wt 57.4 kg (126 lb 9.6 oz)   LMP 10/25/2015   SpO2 97%   BMI 22.43 kg/m  Constitutional: NAD. Vital signs reviewed. Well-developed.  HENT: Normocephalicand atraumatic.  Eyes: EOMI. No discharge.  Cardiovascular: RRR. No JVD. Respiratory: Unlabored. Clear.  GI: Soft. Bowel sounds are normal.  Musculoskeletal: She exhibits no edema, no tenderness.  Neurological: Alert and oriented x3 with some confusion. RUE 4-/5prox to distal.  RLE 3+/5 proximally, 4-/5 distally  LUE 4/5 proximal to distal.  LLE 4-/5 proximally, 4/5 distally  Skin. Warm and dry Psych: Pleasant and appropriate.   Assessment/Plan: 1. Functional deficits secondary to MS exacerbation which require 3+ hours per day of interdisciplinary therapy in a comprehensive inpatient rehab  setting. Physiatrist is providing close team supervision and 24 hour management of active medical problems listed below. Physiatrist and rehab team continue to assess barriers to discharge/monitor patient progress toward functional and medical goals.  Function:  Bathing Bathing position   Position: Shower  Bathing parts Body parts bathed by patient: Left arm, Right arm, Chest, Abdomen, Front perineal area, Buttocks Body parts bathed by helper: Right upper leg, Right lower leg, Left upper leg, Left lower leg, Back  Bathing assist Assist Level: Touching or steadying assistance(Pt > 75%)      Upper Body Dressing/Undressing Upper body dressing   What is the patient wearing?: Pull over shirt/dress     Pull over shirt/dress - Perfomed by patient: Thread/unthread right sleeve, Thread/unthread left sleeve, Pull shirt over trunk, Put head through opening Pull over shirt/dress - Perfomed by helper: Put head through opening, Pull shirt over trunk        Upper body assist Assist Level: Supervision or verbal cues, Set up   Set up : To obtain clothing/put away  Lower Body Dressing/Undressing Lower body dressing   What is the patient wearing?: Pants     Pants- Performed by patient: Pull pants up/down Pants- Performed by helper: Thread/unthread left pants leg, Thread/unthread right pants leg   Non-skid slipper socks- Performed by helper: Don/doff left sock, Don/doff  right sock               TED Hose - Performed by helper: Don/doff right TED hose, Don/doff left TED hose  Lower body assist Assist for lower body dressing: Touching or steadying assistance (Pt > 75%)      Toileting Toileting     Toileting steps completed by helper: Adjust clothing prior to toileting Toileting Assistive Devices: Grab bar or rail  Toileting assist Assist level: Touching or steadying assistance (Pt.75%)   Transfers Chair/bed transfer   Chair/bed transfer method: Ambulatory, Stand pivot Chair/bed  transfer assist level: Supervision or verbal cues Chair/bed transfer assistive device: Walker, Armrests Mechanical lift: Landscape architect Ambulation activity did not occur: Safety/medical concerns   Max distance: 36 ft Assist level: Supervision or verbal cues   Wheelchair   Type: Manual Max wheelchair distance: 60 ft Assist Level: Supervision or verbal cues  Cognition Comprehension Comprehension assist level: Follows complex conversation/direction with extra time/assistive device  Expression Expression assist level: Expresses complex ideas: With extra time/assistive device  Social Interaction Social Interaction assist level: Interacts appropriately 90% of the time - Needs monitoring or encouragement for participation or interaction.  Problem Solving Problem solving assist level: Solves basic 90% of the time/requires cueing < 10% of the time  Memory Memory assist level: Recognizes or recalls 90% of the time/requires cueing < 10% of the time    Medical Problem List and Plan: 1. 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 Continue Dimethyl Fumarate for multiple sclerosis  Cont CIR 2. DVT Prophylaxis/Anticoagulation: SCDs.    Vascular study neg for DVT 3. Pain Management: Hydrocodone as needed 4. Mood: Valium 5 mg every 12 hours as needed anxiety 5. Neuropsych: This patient iscapable of making decisions on herown behalf. 6. Skin/Wound Care: Routine skin checks 7. Fluids/Electrolytes/Nutrition: Routine I&O s 8.Neurogenic bladder. Follow-up urology services  Will d/c foley and trial voiding 9.MRSA UTI. Vantin 200 mg every 12 hours initiated 02/20/20181 more days. Continue contact precautions 10.Acute renal failure/bilateral hydronephrosis.  Follow-up urology services  Cr WNL 2/21 11. HTN  Likely reactive  Controlled 2/23  Monitor with increased mobility 12. ABLA  Hb 9.5 on 2/21  Cont to monitor 13. Headache  Excedrin ordered, pt states  this is effective at home  Improved 14. Nausea  Zofran PRN  Improved 15. Acute lower UTI  UA+, Ucx pending  Empiric Macrobid started 2/23    LOS (Days) 3 A FACE TO FACE EVALUATION WAS PERFORMED  Eddy Liszewski Karis Juba 04/27/2016 8:44 AM

## 2016-04-28 ENCOUNTER — Inpatient Hospital Stay (HOSPITAL_COMMUNITY): Payer: Managed Care, Other (non HMO) | Admitting: Occupational Therapy

## 2016-04-28 NOTE — Progress Notes (Signed)
Crystal Park is a 56 y.o. female 25-Apr-1960 [id number]  Subjective: No new complaints. Continued ache in abd/core and body, bladder spasm pain since cath out. No new problems. Poor sleep and tired today.  Objective: Vital signs in last 24 hours: Temp:  [98.4 F (36.9 C)-99 F (37.2 C)] 99 F (37.2 C) (02/24 0552) Pulse Rate:  [83-110] 110 (02/24 0552) Resp:  [18-20] 20 (02/24 0552) BP: (153-154)/(85-98) 154/85 (02/24 0552) SpO2:  [99 %-100 %] 99 % (02/24 0552) Weight change:  Last BM Date: 04/26/16  Intake/Output from previous day: 02/23 0701 - 02/24 0700 In: 360 [P.O.:360] Out: 1240 [Urine:940]  Physical Exam General: No apparent distress    Lungs: Normal effort. Lungs clear to auscultation, no crackles or wheezes. Cardiovascular: Regular rate and rhythm, no edema Abd: SNTND, +BS Neurological: No new neurological deficits - generalized weakness  Lab Results: BMET    Component Value Date/Time   NA 138 04/25/2016 0609   NA 145 (H) 04/26/2015 0926   K 3.7 04/25/2016 0609   CL 101 04/25/2016 0609   CO2 26 04/25/2016 0609   GLUCOSE 95 04/25/2016 0609   BUN 15 04/25/2016 0609   BUN 14 04/26/2015 0926   CREATININE 0.56 04/25/2016 0609   CALCIUM 8.7 (L) 04/25/2016 0609   GFRNONAA >60 04/25/2016 0609   GFRAA >60 04/25/2016 0609   CBC    Component Value Date/Time   WBC 8.7 04/25/2016 0609   RBC 3.41 (L) 04/25/2016 0609   HGB 9.5 (L) 04/25/2016 0609   HCT 29.4 (L) 04/25/2016 0609   HCT 35.5 02/23/2016 1642   PLT 364 04/25/2016 0609   PLT 531 (H) 02/23/2016 1642   MCV 86.2 04/25/2016 0609   MCV 84 02/23/2016 1642   MCH 27.9 04/25/2016 0609   MCHC 32.3 04/25/2016 0609   RDW 15.2 04/25/2016 0609   RDW 15.1 02/23/2016 1642   LYMPHSABS 1.4 04/25/2016 0609   LYMPHSABS 1.1 02/23/2016 1642   MONOABS 1.3 (H) 04/25/2016 0609   EOSABS 0.5 04/25/2016 0609   EOSABS 0.3 02/23/2016 1642   BASOSABS 0.1 04/25/2016 0609   BASOSABS 0.1 02/23/2016 1642   CBG's (last  3):  No results for input(s): GLUCAP in the last 72 hours. LFT's Lab Results  Component Value Date   ALT 10 (L) 04/25/2016   AST 15 04/25/2016   ALKPHOS 65 04/25/2016   BILITOT 0.3 04/25/2016    Studies/Results: No results found.  Medications:  I have reviewed the patient's current medications. Scheduled Medications: . cefpodoxime  200 mg Oral Q12H  . Dimethyl Fumarate  1 capsule Oral BID  . feeding supplement (ENSURE ENLIVE)  237 mL Oral BID BM  . nitrofurantoin (macrocrystal-monohydrate)  100 mg Oral Q12H   PRN Medications: albuterol, aspirin-acetaminophen-caffeine, diazepam, HYDROcodone-acetaminophen, ondansetron **OR** ondansetron (ZOFRAN) IV, sorbitol  Assessment/Plan: Principal Problem:   Multiple sclerosis exacerbation (HCC) Active Problems:   Sepsis secondary to UTI (HCC)   Neurogenic bladder   Weakness of both arms   Weakness of both legs   Acute blood loss anemia   Reactive hypertension   Other complicated headache syndrome   Acute lower UTI   Length of stay, days: 4  continue supportive care and CIR therapies for MS exac following UTI Med mgmt of chronic conditions as PTA  Yoona Ishii A. Felicity Coyer, MD 04/28/2016, 8:04 AM

## 2016-04-28 NOTE — Progress Notes (Signed)
Occupational Therapy Session Note  Patient Details  Name: Crystal Park MRN: [id number] Date of Birth: 21-Sep-1960  Today's Date: 04/28/2016 OT Individual Time: 1400-1430 OT Individual Time Calculation (min): 30 min    Short Term Goals: Week 1:  OT Short Term Goal 1 (Week 1): Pt will complete LB dressing with overall Mod A OT Short Term Goal 2 (Week 1): Pt will tolerate 3 minutes of standing activity with Min A for balance OT Short Term Goal 3 (Week 1): Pt will complete squat-pivot toilet transfer with Mod A of 1 caregiver  Skilled Therapeutic Interventions/Progress Updates:    Upon entering the room, pt supine in bed with husband present in room. Pt with c/o pain in abdomen and declined therapy outside of bed. Pt required coaxing but agreeable to OT intervention. OT educated and demonstrated use of orange theraband for B UE strengthening exercises. Pt returned demonstrations with min verbal cues for proper technique and multiple rest breaks secondary to fatigue. Pt remained in bed at end of session with spouse present and call bell within reach.   Therapy Documentation Precautions:  Precautions Precautions: Fall Restrictions Weight Bearing Restrictions: No General:   Vital Signs: Therapy Vitals Temp: 97.9 F (36.6 C) Temp Source: Oral Pulse Rate: 90 Resp: 18 BP: 122/67 Patient Position (if appropriate): Lying Oxygen Therapy SpO2: 100 % O2 Device: Not Delivered Pain:   ADL: ADL ADL Comments: Please see functional navigator Exercises:   Other Treatments:    See Function Navigator for Current Functional Status.   Therapy/Group: Individual Therapy  Alen Bleacher 04/28/2016, 2:34 PM

## 2016-04-29 ENCOUNTER — Inpatient Hospital Stay (HOSPITAL_COMMUNITY): Payer: Managed Care, Other (non HMO) | Admitting: Physical Therapy

## 2016-04-29 ENCOUNTER — Inpatient Hospital Stay (HOSPITAL_COMMUNITY): Payer: Managed Care, Other (non HMO) | Admitting: Occupational Therapy

## 2016-04-29 NOTE — Progress Notes (Signed)
Physical Therapy Session Note  Patient Details  Name: Crystal Park MRN: [id number] Date of Birth: [birth year]  Today's Date: 04/29/2016 PT Individual Time: 1110-1205 PT Individual Time Calculation (min): 55 min   Short Term Goals: Week 1:  PT Short Term Goal 1 (Week 1): Pt will ambulate 15 ft with RW min A PT Short Term Goal 2 (Week 1): Pt will perform supine <->sit with mod A  PT Short Term Goal 3 (Week 1): Pt will perform bed rolling min A  PT Short Term Goal 4 (Week 1): Pt will demonstrate chair/bed transfer min A 50% of time with min cues using RW  Skilled Therapeutic Interventions/Progress Updates:  Pt was supine in bed upon arrival. Pt verbalized having a bad morning with nausea and vomiting and requested to perform physical activity in bed. Pt performed 8 bed rolls to the L and R using bedrails and HOB lowered supervision throughout therapy. Pt performed supine to sitting at EOB on the R supervision level 3 times with HOB elevated and use of bedrails. Pt performed 2 x 10 sit-to-stand transfers with RW supervision. Pt performed seated therapeutic exercises on EOB that include: 1 x 10 LAQ with 5 sec holds bilaterally; 1 x 10 high knees bilaterally; 1 x 10 heel raises bilaterally. Pt was complaining of dizziness and nausea with transfers so BP was taken. 159/88 in supine and then 148/88 immediately after transitioning to EOB. No nystagmus was observed with transfers. After returning to bed, pt performed 1 x 10 bridges with min A to complete motion, 1 x 10 isometric hip adduction with 5 sec hold, and 1 x 10 isometric supine hip extension. Pt was left lying in bed with all needs within reach.      Therapy Documentation Precautions:  Precautions Precautions: Fall Restrictions Weight Bearing Restrictions: No Vital Signs: Therapy Vitals Pulse Rate: 84 BP: (!) 145/88 Patient Position (if appropriate): Sitting   See Function Navigator for Current Functional  Status.   Therapy/Group: Individual Therapy  Rudie Meyer 04/29/2016, 11:37 AM

## 2016-04-29 NOTE — Progress Notes (Signed)
Physical Therapy Session Note  Patient Details  Name: Crystal Park MRN: [id number] Date of Birth: August 26, 1960  Today's Date: 04/29/2016 PT Individual Time: 4098-1191 PT Individual Time Calculation (min): 44 min   Short Term Goals: Week 1:  PT Short Term Goal 1 (Week 1): Pt will ambulate 15 ft with RW min A PT Short Term Goal 2 (Week 1): Pt will perform supine <->sit with mod A  PT Short Term Goal 3 (Week 1): Pt will perform bed rolling min A  PT Short Term Goal 4 (Week 1): Pt will demonstrate chair/bed transfer min A 50% of time with min cues using RW  Skilled Therapeutic Interventions/Progress Updates:  Pt was received from OT . Pt continued to express having severe nausea  and dizziness that required frequent rest breaks throughout therapy. Pt propelled W/C 60 ft with multiple rest breaks and min cues for technique to rehab gym. Pt ambulated 2 x 30 ft and 52 ft using Rw at supervision level. Pt performed 1x10 seated heel raises; 1x10 seated toe raises; 1x10 alternating LAQ; 2x10 BUE supported mini-squats; 2x5 isometric trunk stabilization with 1# dowel. Pt was returned to room and required supervision with verbal cues for sequence to perform stand pivot transfer to EOB with RW and transition to supine. Pt was left in bed with all needs within reach and husband present in room.       Therapy Documentation Precautions:  Precautions Precautions: Fall Restrictions Weight Bearing Restrictions: No   See Function Navigator for Current Functional Status.   Therapy/Group: Individual Therapy  Rudie Meyer 04/29/2016, 4:06 PM

## 2016-04-29 NOTE — Progress Notes (Addendum)
Occupational Therapy Session Note  Patient Details  Name: Crystal Park MRN: [id number] Date of Birth: 1961/01/04  Today's Date: 04/29/2016 OT Individual Time: 4098-1191 and 1511-1540 OT Individual Time Calculation (min): 66 min and 29 min  Skilled Therapeutic Interventions/Progress Updates:  Pt was lying in bed at time of arrival, agreeable to session. Tx focus on ADL retraining, energy conservation, standing balance, and cognitive skills. Due to pt verbalizing weakness and uncertainty whether she had the strength to ambulate into bathroom, pt completed stand pivot transfer with RW<bed<w/c<toilet with Min A. Pt able to complete toileting tasks with min guard. She then agreed to ambulate short distance to tub bench for shower. She did so with Min A and RW. Bathing completed with overall min guard with extra time, utilizing figure 4 position to wash feet and standing for pericare. Afterwards she ambulated to w/c positioned at sink for dressing. She required overall min guard and questioning cues for sequencing/attention. Oral care/grooming tasks completed in sitting for energy conservation. Discussed home bathroom setup, pts husband is making bathroom w/c accessible including walk in shower with grab bars. After Teds were donned and abdominal wrap was applied with Mod A, pt began vomiting into sink. Emesis bags retrieved for bedside and RN notified. Pt was then returned to bed via stand pivot with RW. She was repositioned for comfort and left with all needs within reach at time of departure.   2nd Session 1:1 tx (29 min) Pt was lying in bed at time of arrival, agreeable to session. No c/o nausea. Tx focus on activity tolerance, cognitive skills, and ADL retraining. Pt reported that she needed to void, ambulated to bathroom with Min A and RW. Pts urine rusty color, RN notified. Hand washing/oral care completed w/c level at sink with min vcs on sequencing (forgetting to turn faucet off). Afterwards she  self propelled in hallway 125 ft to gym for next therapy session (for UB strengthening and activity tolerance). Had pt avoid environmental barriers for problem solving, required mod cues for this (she bumped into cleaning bucket and wall several times instead of going around them).    Pt left with PT for handoff.   Therapy Documentation Precautions:  Precautions Precautions: Fall Restrictions Weight Bearing Restrictions: No   Vital Signs: Therapy Vitals Pulse Rate: 84 BP: (!) 145/88 Patient Position (if appropriate): Sitting Pain: No c/o pain during session  Pain Assessment Pain Assessment: 0-10 Pain Score: 6  Pain Location: Back Pain Orientation: Right;Mid;Lower Pain Frequency: Intermittent Patients Stated Pain Goal: 3 Pain Intervention(s): Medication (See eMAR);Repositioned ADL: ADL ADL Comments: Please see functional navigator     See Function Navigator for Current Functional Status.   Therapy/Group: Individual Therapy  Modesta Sammons A Dimitra Woodstock 04/29/2016, 12:00 PM

## 2016-04-29 NOTE — Progress Notes (Signed)
Crystal Park is a 56 y.o. female December 19, 1960 [id number]  Subjective: No new complaints. Less vaginal burning and bladder spasms. No new problems. Slept OK. Feeling fatigued.  Objective: Vital signs in last 24 hours: Temp:  [97.9 F (36.6 C)-98.6 F (37 C)] 98.6 F (37 C) (02/25 0510) Pulse Rate:  [90-99] 99 (02/25 0510) Resp:  [18-20] 20 (02/25 0510) BP: (122-138)/(67-85) 138/85 (02/25 0510) SpO2:  [100 %] 100 % (02/25 0510) Weight change:  Last BM Date: (P) 04/26/16  Intake/Output from previous day: 02/24 0701 - 02/25 0700 In: 720 [P.O.:720] Out: 1400 [Urine:1400]  Physical Exam General: No apparent distress   Tired Lungs: Normal effort. Lungs clear to auscultation, no crackles or wheezes. Cardiovascular: Regular rate and rhythm, no edema Musculoskeletal:  Neurovascularly intact Neurological: No new neurological deficits; generalized weakness LE>UE  Lab Results: BMET    Component Value Date/Time   NA 138 04/25/2016 0609   NA 145 (H) 04/26/2015 0926   K 3.7 04/25/2016 0609   CL 101 04/25/2016 0609   CO2 26 04/25/2016 0609   GLUCOSE 95 04/25/2016 0609   BUN 15 04/25/2016 0609   BUN 14 04/26/2015 0926   CREATININE 0.56 04/25/2016 0609   CALCIUM 8.7 (L) 04/25/2016 0609   GFRNONAA >60 04/25/2016 0609   GFRAA >60 04/25/2016 0609   CBC    Component Value Date/Time   WBC 8.7 04/25/2016 0609   RBC 3.41 (L) 04/25/2016 0609   HGB 9.5 (L) 04/25/2016 0609   HCT 29.4 (L) 04/25/2016 0609   HCT 35.5 02/23/2016 1642   PLT 364 04/25/2016 0609   PLT 531 (H) 02/23/2016 1642   MCV 86.2 04/25/2016 0609   MCV 84 02/23/2016 1642   MCH 27.9 04/25/2016 0609   MCHC 32.3 04/25/2016 0609   RDW 15.2 04/25/2016 0609   RDW 15.1 02/23/2016 1642   LYMPHSABS 1.4 04/25/2016 0609   LYMPHSABS 1.1 02/23/2016 1642   MONOABS 1.3 (H) 04/25/2016 0609   EOSABS 0.5 04/25/2016 0609   EOSABS 0.3 02/23/2016 1642   BASOSABS 0.1 04/25/2016 0609   BASOSABS 0.1 02/23/2016 1642   CBG's (last  3):  No results for input(s): GLUCAP in the last 72 hours. LFT's Lab Results  Component Value Date   ALT 10 (L) 04/25/2016   AST 15 04/25/2016   ALKPHOS 65 04/25/2016   BILITOT 0.3 04/25/2016    Studies/Results: No results found.  Medications:  I have reviewed the patient's current medications. Scheduled Medications: . Dimethyl Fumarate  1 capsule Oral BID  . feeding supplement (ENSURE ENLIVE)  237 mL Oral BID BM  . nitrofurantoin (macrocrystal-monohydrate)  100 mg Oral Q12H   PRN Medications: albuterol, aspirin-acetaminophen-caffeine, diazepam, HYDROcodone-acetaminophen, ondansetron **OR** ondansetron (ZOFRAN) IV, sorbitol  Assessment/Plan: Principal Problem:   Multiple sclerosis exacerbation (HCC) Active Problems:   Sepsis secondary to UTI (HCC)   Neurogenic bladder   Weakness of both arms   Weakness of both legs   Acute blood loss anemia   Reactive hypertension   Other complicated headache syndrome   Acute lower UTI   Length of stay, days: 5  Continue IP rehab as ongoing Continue med mgmt of chronic conditions as ongoing, no changes needed Support offered and questions answered  Crystal Cadena A. Felicity Coyer, MD 04/29/2016, 9:38 AM

## 2016-04-30 ENCOUNTER — Inpatient Hospital Stay (HOSPITAL_COMMUNITY): Payer: Managed Care, Other (non HMO) | Admitting: Occupational Therapy

## 2016-04-30 ENCOUNTER — Inpatient Hospital Stay (HOSPITAL_COMMUNITY): Payer: Managed Care, Other (non HMO)

## 2016-04-30 DIAGNOSIS — K5901 Slow transit constipation: Secondary | ICD-10-CM

## 2016-04-30 DIAGNOSIS — B379 Candidiasis, unspecified: Secondary | ICD-10-CM

## 2016-04-30 MED ORDER — SENNOSIDES-DOCUSATE SODIUM 8.6-50 MG PO TABS
1.0000 | ORAL_TABLET | Freq: Every day | ORAL | Status: DC
  Administered 2016-04-30 – 2016-05-04 (×5): 1 via ORAL
  Filled 2016-04-30 (×5): qty 1

## 2016-04-30 MED ORDER — FLUCONAZOLE 100 MG PO TABS: 150.0000 mg | ORAL_TABLET | Freq: Once | ORAL | Status: DC

## 2016-04-30 MED ORDER — FLUCONAZOLE 100 MG PO TABS
150.0000 mg | ORAL_TABLET | Freq: Once | ORAL | Status: AC
  Administered 2016-04-30: 150 mg via ORAL
  Filled 2016-04-30: qty 2
  Filled 2016-04-30: qty 1.5

## 2016-04-30 MED ORDER — POLYETHYLENE GLYCOL 3350 17 G PO PACK
17.0000 g | PACK | Freq: Every day | ORAL | Status: DC
Start: 2016-04-30 — End: 2016-05-05
  Administered 2016-04-30 – 2016-05-05 (×6): 17 g via ORAL
  Filled 2016-04-30 (×4): qty 1

## 2016-04-30 NOTE — Progress Notes (Signed)
Occupational Therapy Session Note  Patient Details  Name: Crystal Park MRN: [id number] Date of Birth: 01/06/1961  Today's Date: 04/30/2016 OT Individual Time: 8588-5027 OT Individual Time Calculation (min): 59 min    Short Term Goals: Week 1:  OT Short Term Goal 1 (Week 1): Pt will complete LB dressing with overall Mod A OT Short Term Goal 2 (Week 1): Pt will tolerate 3 minutes of standing activity with Min A for balance OT Short Term Goal 3 (Week 1): Pt will complete squat-pivot toilet transfer with Mod A of 1 caregiver      Skilled Therapeutic Interventions/Progress Updates: Pt was supine in bed at time of arrival, reporting increased nausea and pain in abdomen. RN retrieved to provide medication. RN suggested that pt try to void to ease abdominal discomfort. Pt completed toilet transfer with supervision while ambulating with RW. Supervision for toileting tasks with cues for safe hand placement. Urine rusty red color with RN made aware.Due to soiled brief, pt was agreeable to change clothes. Pt ambulated to dresser with supervision and required cues for walker safety. Dressing then completed at EOB with RW. Pt required questioning and instructional cues regarding sequencing/initiation of tasks. Multiple rest breaks provided due to fatigue/pain. Pt incontinent of urine on floor while changing brief, required cues to problem solve and to acknowledge.  Pt provided with energy conservation handout, discussed strategies to implement at home with verbalized understanding. Education to continue. At end of session pt ambulated to recliner in manner as written above. She was repositioned for comfort with emesis bags nearby. Encouraged her to eat applesauce/breakfast due to lack of appetite. She reports all food makes her nauseated. RN made aware. She was left with all needs within reach at time of departure.      Therapy Documentation Precautions:  Precautions Precautions:  Fall Restrictions Weight Bearing Restrictions: No   Pain: 9/10 pain, rest breaks provided and pt medicated during session    ADL: ADL ADL Comments: Please see functional navigator    See Function Navigator for Current Functional Status.   Therapy/Group: Individual Therapy  Vannary Greening A Kenni Newton 04/30/2016, 12:19 PM

## 2016-04-30 NOTE — Plan of Care (Deleted)
Problem: RH Memory Goal: LTG Patient will demonstrate ability for day to day (OT) LTG:  Patient will demonstrate ability for day to day recall/carryover during activities of daily living with assist  (OT)  Outcome: Not Applicable Date Met: 60/60/04 N/a- pt has no memory deficits - ESD 2/26

## 2016-04-30 NOTE — Progress Notes (Signed)
Physical Therapy Note  Patient Details  Name: Crystal Park MRN: [id number] Date of Birth: January 14, 1961 Today's Date: 04/30/2016  1535-1630, 55 min individual tx Pain: 7/10 abdominal and back pain; RN nformed also 5/10 nausea Pt stated initially she was too nauseated to get OOB, but willing to do tx bedside.  neuromuscular re-education via multimodal cues for 10 x 1 each bil bridging, R assisted/L straight leg raises (limited excursion bil), R/L ankle circles clockwise and countclockwise, R/L short arc quad knee ext with assistance R, R/L shoulder protraction.  Pt requested sitting EOB to eat Jello.  supervison for supine> sit: min assist sit> supine, using rail. Pt sat bedside x 10 minutes while feeding herself, independently.  Pt lay back down when fatigued.  She requested ice pack for abdominal pain.  PT spoke with Dois Davenport, RN re: pt's pain and nausea.  PT spoke with Charisse March, RN to request a K pad for pain.  Pt left resting in bed with alarm set, ice pack on abdomen, with all needs within reach.   See function navigator for current status  Cynda Soule 04/30/2016, 2:39 PM

## 2016-04-30 NOTE — Progress Notes (Signed)
Physical Therapy Session Note  Patient Details  Name: SHAMONICA GOULDING MRN: [id number] Date of Birth: 02/02/1961  Today's Date: 04/30/2016 PT Individual Time: 1345-1428 PT Individual Time Calculation (min): 43 min   Short Term Goals: Week 1:  PT Short Term Goal 1 (Week 1): Pt will ambulate 15 ft with RW min A PT Short Term Goal 2 (Week 1): Pt will perform supine <->sit with mod A  PT Short Term Goal 3 (Week 1): Pt will perform bed rolling min A  PT Short Term Goal 4 (Week 1): Pt will demonstrate chair/bed transfer min A 50% of time with min cues using RW  Skilled Therapeutic Interventions/Progress Updates:    Pt reports overall not feeling well c/o pain in abdominal area and nausea. Agreeable to sit EOB to attempt intake of lunch and educated on importance of continued upright mobility. With encouragement and extra time, pt focused on sitting EOB, using BUE for fine motor tasks to pour juice into cup and self-feed jello. Pt reports having difficulty with opening containers due to weakness, especially in RUE. Discussed bowel and bladder function, and agreeable to attempt toileting. Pt able to gait with RW in/out of bathroom with supervision to steadying assist with cues for upright posture. Completed toilet transfers, standing balance for hygiene and clothing management, and dynamic standing balance at sink for hand hygiene with overall min assist. Pt with tendency for trunk and knee flexion with prolonged standing, but able to correct with cues. Slow pace for all activities and rest breaks as needed. Energy conservation discussed throughout session. End of session request to return to supine with all needs in reach.   NT notified of void in toilet to allow for bladder scan.   Therapy Documentation Precautions:  Precautions Precautions: Fall Restrictions Weight Bearing Restrictions: No  Pain: Pain Assessment Pain Assessment: Faces Pain Score: 8  Pain Type: Acute pain Pain Location:  Head Pain Orientation: Right 2nd Pain Site Pain Score: 8 Pain Type: Acute pain Pain Location: Head Pain Orientation: Right;Other (Comment) (Temperol) Pain Frequency: Intermittent Pain Onset: Gradual Patient's Stated Pain Goal: 0 Pain Intervention(s): Medication (See eMAR)     See Function Navigator for Current Functional Status.   Therapy/Group: Individual Therapy  Karolee Stamps Darrol Poke, PT, DPT  04/30/2016, 2:35 PM

## 2016-04-30 NOTE — Progress Notes (Addendum)
Occupational Therapy Session Note  Patient Details  Name: Crystal Park MRN: [id number] Date of Birth: 05-23-1960  Today's Date: 04/30/2016 OT Individual Time: 1100-1140 OT Individual Time Calculation (min): 40 min   Short Term Goals: Week 1:  OT Short Term Goal 1 (Week 1): Pt will complete LB dressing with overall Mod A OT Short Term Goal 2 (Week 1): Pt will tolerate 3 minutes of standing activity with Min A for balance OT Short Term Goal 3 (Week 1): Pt will complete squat-pivot toilet transfer with Mod A of 1 caregiver  Skilled Therapeutic Interventions/Progress Updates:  Pt greeted in bed, reporting nausea, headache, and fatigue. With encouragement, agreeable to attempt as much therapy as she could. Pt transferred to sitting EOB with supervision, reported increased nausea and abdominal pain, but able to tolerate ~ 15 minutes at EOB for self-feeding and grooming tasks. Pt declined getting out of bed to w/c or participating in any functional mobility, despite max encouragement. Agreeable to there-ex in room. Seated leg extensions and arm raises 10 x 2sets. Then pt returned to supine 2/2 increase nausea and headache. Nausea passed, then pt completed knee extension, ankle pumps, and glute sets 10 x 3 sets. Pt left semi-reclined in bed at end of session with needs met.  Therapy Documentation Precautions:  Precautions Precautions: Fall Restrictions Weight Bearing Restrictions: No General: General OT Amount of Missed Time: 20 Minutes Pain: Pain Assessment Pain Assessment: (P) Faces Pain Score: (P) 8  Pain Type: (P) Acute pain Pain Location: (P) Head Pain Orientation: (P) Right ADL: ADL ADL Comments: Please see functional navigator  See Function Navigator for Current Functional Status.   Therapy/Group: Individual Therapy  [name] 04/30/2016, 12:51 PM

## 2016-04-30 NOTE — Progress Notes (Signed)
[facility] PHYSICAL MEDICINE & REHABILITATION     PROGRESS NOTE  Subjective/Complaints:  Pt seen laying in bed.  She states she slept "okay", however, this morning she states she "does not feel good". With "vaginal burning, problem with urination, bowel movement, and nausea".  ROS:  +vaginal burning, problem with urination, bowel movement, and nausea. Denies CP, SOB, D.  Objective: Vital Signs: Blood pressure (!) 153/90, pulse (!) 104, temperature 98.5 F (36.9 C), temperature source Oral, resp. rate 18, height 5\' 3"  (1.6 m), weight 57.4 kg (126 lb 9.6 oz), last menstrual period 10/25/2015, SpO2 98 %. No results found. No results for input(s): WBC, HGB, HCT, PLT in the last 72 hours. No results for input(s): NA, K, CL, GLUCOSE, BUN, CREATININE, CALCIUM in the last 72 hours.  Invalid input(s): CO CBG (last 3)  No results for input(s): GLUCAP in the last 72 hours.  Wt Readings from Last 3 Encounters:  04/24/16 57.4 kg (126 lb 9.6 oz)  04/21/16 54.1 kg (119 lb 4.3 oz)  02/23/16 59.2 kg (130 lb 8 oz)    Physical Exam:  BP (!) 153/90 (BP Location: Right Arm)   Pulse (!) 104   Temp 98.5 F (36.9 C) (Oral)   Resp 18   Ht 5\' 3"  (1.6 m)   Wt 57.4 kg (126 lb 9.6 oz)   LMP 10/25/2015   SpO2 98%   BMI 22.43 kg/m  Constitutional: NAD. Vital signs reviewed. Well-developed.  HENT: Normocephalicand atraumatic.  Eyes: EOMI. No discharge.  Cardiovascular: RRR. No JVD. Respiratory: Unlabored. Clear.  GI: Soft. Bowel sounds are normal.  Musculoskeletal: She exhibits no edema, no tenderness.  Neurological: Alert and oriented x3 with some confusion. RUE 4/5prox to distal.  RLE 4-/5 proximally, 4/5 distally  LUE 4/5 proximal to distal.  LLE 4-/5 proximally, 4/5 distally  Skin. Warm and dry Psych: Anxious.  Assessment/Plan: 1. Functional deficits secondary to MS exacerbation which require 3+ hours per day of interdisciplinary therapy in a comprehensive inpatient rehab  setting. Physiatrist is providing close team supervision and 24 hour management of active medical problems listed below. Physiatrist and rehab team continue to assess barriers to discharge/monitor patient progress toward functional and medical goals.  Function:  Bathing Bathing position Bathing activity did not occur: Refused Position: Systems developer parts bathed by patient: Left arm, Right arm, Chest, Abdomen, Front perineal area, Buttocks, Right upper leg, Left upper leg, Right lower leg, Left lower leg, Back Body parts bathed by helper: Right upper leg, Right lower leg, Left upper leg, Left lower leg, Back  Bathing assist Assist Level: Touching or steadying assistance(Pt > 75%)      Upper Body Dressing/Undressing Upper body dressing   What is the patient wearing?: Pull over shirt/dress     Pull over shirt/dress - Perfomed by patient: Thread/unthread right sleeve, Thread/unthread left sleeve, Put head through opening, Pull shirt over trunk Pull over shirt/dress - Perfomed by helper: Put head through opening, Pull shirt over trunk        Upper body assist Assist Level: Supervision or verbal cues   Set up : To obtain clothing/put away  Lower Body Dressing/Undressing Lower body dressing   What is the patient wearing?: Pants, Non-skid slipper socks, Ted Hose     Pants- Performed by patient: Thread/unthread right pants leg, Thread/unthread left pants leg, Pull pants up/down Pants- Performed by helper: Thread/unthread left pants leg, Thread/unthread right pants leg Non-skid slipper socks- Performed by patient: Don/doff right sock, Don/doff left  sock Non-skid slipper socks- Performed by helper: Don/doff left sock, Don/doff right sock Socks - Performed by patient: Don/doff right sock, Don/doff left sock   Shoes - Performed by patient: Don/doff right shoe, Don/doff left shoe         TED Hose - Performed by helper: Don/doff right TED hose, Don/doff left TED hose  Lower  body assist Assist for lower body dressing: Touching or steadying assistance (Pt > 75%)      Toileting Toileting   Toileting steps completed by patient: Adjust clothing prior to toileting, Performs perineal hygiene Toileting steps completed by helper: Adjust clothing prior to toileting, Performs perineal hygiene, Adjust clothing after toileting Toileting Assistive Devices: Grab bar or rail  Toileting assist Assist level: Touching or steadying assistance (Pt.75%)   Transfers Chair/bed transfer   Chair/bed transfer method: Ambulatory, Stand pivot Chair/bed transfer assist level: Touching or steadying assistance (Pt > 75%) Chair/bed transfer assistive device: Armrests, Walker Mechanical lift: Landscape architect Ambulation activity did not occur: Safety/medical concerns   Max distance: 52 ft Assist level: Supervision or verbal cues   Wheelchair   Type: Manual Max wheelchair distance: 60 ft Assist Level: Supervision or verbal cues  Cognition Comprehension Comprehension assist level: Follows complex conversation/direction with extra time/assistive device  Expression Expression assist level: Expresses complex 90% of the time/cues < 10% of the time  Social Interaction Social Interaction assist level: Interacts appropriately with others with medication or extra time (anti-anxiety, antidepressant).  Problem Solving Problem solving assist level: Solves basic 75 - 89% of the time/requires cueing 10 - 24% of the time  Memory Memory assist level: Recognizes or recalls 75 - 89% of the time/requires cueing 10 - 24% of the time    Medical Problem List and Plan: 1. 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 Continue Dimethyl Fumarate for multiple sclerosis  Cont CIR  Weekend notes reviewed 2. DVT Prophylaxis/Anticoagulation: SCDs.    Vascular study neg for DVT 3. Pain Management: Hydrocodone as needed 4. Mood: Valium 5 mg every 12 hours as needed anxiety 5.  Neuropsych: This patient iscapable of making decisions on herown behalf. 6. Skin/Wound Care: Routine skin checks 7. Fluids/Electrolytes/Nutrition: Routine I&O s 8.Neurogenic bladder. Follow-up urology services  ?Retention, will consider meds after tx with antifungals 9.MRSA UTI. Completed Vantin 200 mg every 12 hours. Continue contact precautions 10.Acute renal failure/bilateral hydronephrosis.  Follow-up urology services  Cr WNL 2/21 11. HTN  Reactive  Monitor with increased mobility 12. ABLA  Hb 9.5 on 2/21  Cont to monitor 13. Headache  Excedrin ordered, pt states this is effective at home  Improved 14. Nausea  Zofran PRN 15. Yeast infection  Diflucan ordered 2/26 16. Constipation  Bowel reg increased on 2/26  LOS (Days) 6 A FACE TO FACE EVALUATION WAS PERFORMED  Alessander Sikorski Karis Juba 04/30/2016 8:58 AM

## 2016-04-30 NOTE — Plan of Care (Signed)
Problem: RH Simple Meal Prep Goal: LTG Patient will perform simple meal prep w/assist (OT) LTG: Patient will perform simple meal prep with assistance, with/without cues (OT).  Outcome: Not Applicable Date Met: 12/50/87 N/a pt will have assist for iADL-  ED 2/26  Problem: RH Laundry Goal: LTG Patient will perform laundry w/assist, cues (OT) LTG: Patient will perform laundry with assistance, with/without cues (OT).  Outcome: Not Applicable Date Met: 19/94/12 N/a pt will have assist for iADL-  ED 2/26

## 2016-05-01 ENCOUNTER — Inpatient Hospital Stay (HOSPITAL_COMMUNITY): Payer: Managed Care, Other (non HMO) | Admitting: Occupational Therapy

## 2016-05-01 ENCOUNTER — Inpatient Hospital Stay (HOSPITAL_COMMUNITY): Payer: Managed Care, Other (non HMO) | Admitting: Physical Therapy

## 2016-05-01 DIAGNOSIS — R339 Retention of urine, unspecified: Secondary | ICD-10-CM

## 2016-05-01 LAB — URINALYSIS, ROUTINE W REFLEX MICROSCOPIC
Bilirubin Urine: NEGATIVE
Glucose, UA: NEGATIVE mg/dL
Ketones, ur: 5 mg/dL — AB
Nitrite: NEGATIVE
Protein, ur: 100 mg/dL — AB
Specific Gravity, Urine: 1.008 (ref 1.005–1.030)
Squamous Epithelial / LPF: NONE SEEN
pH: 7 (ref 5.0–8.0)

## 2016-05-01 MED ORDER — ENSURE ENLIVE PO LIQD
237.0000 mL | Freq: Three times a day (TID) | ORAL | Status: DC
  Administered 2016-05-02 – 2016-05-04 (×5): 237 mL via ORAL

## 2016-05-01 NOTE — Progress Notes (Signed)
[facility] PHYSICAL MEDICINE & REHABILITATION     PROGRESS NOTE  Subjective/Complaints:  Pt seen laying in bed this AM.  She states she slept well overnight and feels better this AM.    ROS:  +nausea. Denies CP, SOB, D.  Objective: Vital Signs: Blood pressure 133/72, pulse 99, temperature 97.9 F (36.6 C), temperature source Oral, resp. rate 18, height 5\' 3"  (1.6 m), weight 57.4 kg (126 lb 9.6 oz), last menstrual period 10/25/2015, SpO2 96 %. No results found. No results for input(s): WBC, HGB, HCT, PLT in the last 72 hours. No results for input(s): NA, K, CL, GLUCOSE, BUN, CREATININE, CALCIUM in the last 72 hours.  Invalid input(s): CO CBG (last 3)  No results for input(s): GLUCAP in the last 72 hours.  Wt Readings from Last 3 Encounters:  04/24/16 57.4 kg (126 lb 9.6 oz)  04/21/16 54.1 kg (119 lb 4.3 oz)  02/23/16 59.2 kg (130 lb 8 oz)    Physical Exam:  BP 133/72 (BP Location: Left Arm)   Pulse 99   Temp 97.9 F (36.6 C) (Oral)   Resp 18   Ht 5\' 3"  (1.6 m)   Wt 57.4 kg (126 lb 9.6 oz)   LMP 10/25/2015   SpO2 96%   BMI 22.43 kg/m  Constitutional: NAD. Vital signs reviewed. Well-developed.  HENT: Normocephalicand atraumatic.  Eyes: EOMI. No discharge.  Cardiovascular: RRR. No JVD. Respiratory: Unlabored. Clear.  GI: Soft. Bowel sounds are normal.  Musculoskeletal: She exhibits no edema, no tenderness.  Neurological: Alert and oriented x3 with some confusion. RUE 4/5prox to distal.  RLE 4-/5 proximally, 4/5 distally  LUE 4/5 proximal to distal.  LLE 4-/5 proximally, 4/5 distally (unchanged) Skin. Warm and dry Psych: Anxious. Flat  Assessment/Plan: 1. Functional deficits secondary to MS exacerbation which require 3+ hours per day of interdisciplinary therapy in a comprehensive inpatient rehab setting. Physiatrist is providing close team supervision and 24 hour management of active medical problems listed below. Physiatrist and rehab team continue to assess  barriers to discharge/monitor patient progress toward functional and medical goals.  Function:  Bathing Bathing position Bathing activity did not occur: Refused Position: Systems developer parts bathed by patient: Left arm, Right arm, Chest, Abdomen, Front perineal area, Buttocks, Right upper leg, Left upper leg, Right lower leg, Left lower leg, Back Body parts bathed by helper: Right upper leg, Right lower leg, Left upper leg, Left lower leg, Back  Bathing assist Assist Level: Touching or steadying assistance(Pt > 75%)      Upper Body Dressing/Undressing Upper body dressing   What is the patient wearing?: Pull over shirt/dress     Pull over shirt/dress - Perfomed by patient: Thread/unthread right sleeve, Thread/unthread left sleeve, Put head through opening, Pull shirt over trunk Pull over shirt/dress - Perfomed by helper: Put head through opening, Pull shirt over trunk        Upper body assist Assist Level: Supervision or verbal cues   Set up : To obtain clothing/put away  Lower Body Dressing/Undressing Lower body dressing   What is the patient wearing?: Pants, Ted Hose, Non-skid slipper socks     Pants- Performed by patient: Thread/unthread right pants leg, Thread/unthread left pants leg, Pull pants up/down Pants- Performed by helper: Thread/unthread left pants leg, Thread/unthread right pants leg Non-skid slipper socks- Performed by patient: Don/doff right sock, Don/doff left sock Non-skid slipper socks- Performed by helper: Don/doff right sock, Don/doff left sock Socks - Performed by patient: Don/doff right sock,  Don/doff left sock   Shoes - Performed by patient: Don/doff right shoe, Don/doff left shoe         TED Hose - Performed by helper: Don/doff right TED hose, Don/doff left TED hose  Lower body assist Assist for lower body dressing: Touching or steadying assistance (Pt > 75%)      Toileting Toileting   Toileting steps completed by patient: Adjust  clothing prior to toileting, Performs perineal hygiene, Adjust clothing after toileting Toileting steps completed by helper: Adjust clothing prior to toileting, Performs perineal hygiene, Adjust clothing after toileting Toileting Assistive Devices: Grab bar or rail  Toileting assist Assist level: Touching or steadying assistance (Pt.75%)   Transfers Chair/bed transfer   Chair/bed transfer method: Ambulatory, Stand pivot Chair/bed transfer assist level: Supervision or verbal cues Chair/bed transfer assistive device: Biochemist, clinical lift: Landscape architect Ambulation activity did not occur: Safety/medical concerns   Max distance: 12' Assist level: Touching or steadying assistance (Pt > 75%)   Wheelchair   Type: Manual Max wheelchair distance: 60 ft Assist Level: Supervision or verbal cues  Cognition Comprehension Comprehension assist level: Follows basic conversation/direction with no assist  Expression Expression assist level: Expresses basic needs/ideas: With extra time/assistive device  Social Interaction Social Interaction assist level: Interacts appropriately with others with medication or extra time (anti-anxiety, antidepressant).  Problem Solving Problem solving assist level: Solves basic 75 - 89% of the time/requires cueing 10 - 24% of the time  Memory Memory assist level: Recognizes or recalls 75 - 89% of the time/requires cueing 10 - 24% of the time    Medical Problem List and Plan: 1. 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 Continue Dimethyl Fumarate for multiple sclerosis  Cont CIR 2. DVT Prophylaxis/Anticoagulation: SCDs.    Vascular study neg for DVT 3. Pain Management: Hydrocodone as needed 4. Mood: Valium 5 mg every 12 hours as needed anxiety 5. Neuropsych: This patient iscapable of making decisions on herown behalf. 6. Skin/Wound Care: Routine skin checks 7. Fluids/Electrolytes/Nutrition: Routine I&O s 8.Neurogenic  bladder. Follow-up urology services  Retention, will start meds after urine results  Repeat UA/Ucx ordered 9.MRSA UTI. Completed Vantin 200 mg every 12 hours. Continue contact precautions 10.Acute renal failure/bilateral hydronephrosis.  Follow-up urology services  Cr WNL 2/21 11. HTN  Controlled 2/27  Monitor with increased mobility 12. ABLA  Hb 9.5 on 2/21  Cont to monitor 13. Headache  Excedrin ordered, pt states this is effective at home  Improved 14. Nausea  Zofran PRN 15. Yeast infection  Diflucan ordered 2/26 16. Constipation  Bowel reg increased on 2/26  Improving  LOS (Days) 7 A FACE TO FACE EVALUATION WAS PERFORMED  [facility] 05/01/2016 9:19 AM

## 2016-05-01 NOTE — Progress Notes (Signed)
Physical Therapy Session Note  Patient Details  Name: [facility] MRN: [id number] Date of Birth: 03-24-1960  Today's Date: 05/01/2016 PT Individual Time: 0900-1000 and 1415-1530 PT Individual Time Calculation (min): 60 min and 75 min  Short Term Goals: Week 1:  PT Short Term Goal 1 (Week 1): Pt will ambulate 15 ft with RW min A PT Short Term Goal 2 (Week 1): Pt will perform supine <->sit with mod A  PT Short Term Goal 3 (Week 1): Pt will perform bed rolling min A  PT Short Term Goal 4 (Week 1): Pt will demonstrate chair/bed transfer min A 50% of time with min cues using RW  Skilled Therapeutic Interventions/Progress Updates:  Session 1: Pt was supine in bed upon arrival. Pt was complaining of 8-9/10 nausea throughout therapy and nurse was notifed. PT performed supine therapeutic exercises consisting of: 1x8 heel slides bilaterally, 1x8 SAQ with 5 sec holds bilaterally, 2x8 bridges with pillow squeezed between knees, 1x10 ankle pumps. Pt required set up to retrieve and verbal cues for instructions to donn non-slip socks. Pt performed 6 bed rolls in both directions supervision with HOB elevated and with bedrails. Pt was supervision with sitting up at EOB with HOB elevated to prepare to use restroom. Pt ambulated 15 ft supervision with RW to restroom and required supervision to donn/doff pants and briefs, sit and stand from commode, and return to bed. Pt had no bowel or bladder moment. Pt returned to EOB supervision using Rw. Pt performed 10 sit-to-stand transfers at EOB to strengthen BLE and improve CV endurance. Pt refused to sit up in recliner due to continued sensation of nausea. Pt required supervision for sit-to-lying transfer to return to bed. Pt was left in bed with all needs within reach. Pt continues to be limited due to excess fatigue and required max cueing for encouragement to participate and complete tasks.  Session 2:  Pt was supine in bed upon arrival. PT reported wanting to use  restroom because she wasn't sure if she had a bowel movement while she slept. Pt was supervision with transfering to EOB and ambulating 15 ft to commode using RW. Pt was incontinent of bowel and needed to continue having a bowel movement once on the commode. Pt was supervision with donning/doffing pants and set up for hand clothes to perform perineal hygiene. Small amount of blood was seen with bowel movement so nursing was notified. Pt was supervision with ambulating to sink and wash hands. Pt was total assist with donning L shoe and footup orthosis while needing set up to don R shoe before leaving room with verbal cues for sequence and technique. Pt ambulated 110 ft supervision using RW with verbal cues to encourage continuing with task. Pt performed 1 car transfer supervision with RW with min cues for sequence and technique. Pt performed 2 trials of the TUG outcome measure and scored an average time of 43 seconds, improving from a time of 69 seconds, but still at an increased risk for falling. Pt ascended and descended forward on 4 six inch steps using BUE support with supervision level of assistance. Pt continued to be limited by nausea and fatigue so frequent rest breaks were required and verbal cues for sustained attention on distance object with activities to reduce nausea symptoms. Pt was returned to room and transferred into the recliner. Pt was left in recliner with all needs within reach.      Therapy Documentation Precautions:  Precautions Precautions: Fall Restrictions Weight Bearing Restrictions: No  Pain: Pain Assessment Pain Assessment: No/denies pain   See Function Navigator for Current Functional Status.   Therapy/Group: Individual Therapy  Rudie Meyer 05/01/2016, 7:57 AM

## 2016-05-01 NOTE — Progress Notes (Signed)
Nutrition Follow-up  DOCUMENTATION CODES:   Not applicable  INTERVENTION:  Provide Ensure Enlive po TID, each supplement provides 350 kcal and 20 grams of protein.  Encourage adequate PO intake.   NUTRITION DIAGNOSIS:   Increased nutrient needs related to  (therapy) as evidenced by estimated needs; ongoing  GOAL:   Patient will meet greater than or equal to 90% of their needs; progressing  MONITOR:   PO intake, Supplement acceptance, Labs, Weight trends, Skin, I & O's  REASON FOR ASSESSMENT:   Malnutrition Screening Tool    ASSESSMENT:   56 y.o.Right-handed  female with hx of MS diagnosed 9/16. Admitted 04/20/16 with complaints of progressive weakness/recent UTI completing Macrobid/urinary retention. Work up with CT of abdomen and pelvis revealed urosepsis  bilateral hydronephrosis.  Urology dxed neurogenic bladder and recommended ongoing abx, foley with plan voiding trial. Patient also was mild hematuria felt to be related to a combination of Foley catheter tubing UTI. 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  Pt reports nausea which has been ongoing since yesterday. Pt reports she had experienced vomiting after her meal yesterday. Meal completion has been recently 25-50%. Pt reports she would like to continue with her Ensure shakes. RD to modify orders to increase Ensure to TID to aid in caloric and protein needs. Pt encouraged to eat her foods at meals and to drink her supplements.   Diet Order:  Diet regular Room service appropriate? Yes; Fluid consistency: Thin  Skin:  Reviewed, no issues  Last BM:  2/27  Height:   Ht Readings from Last 1 Encounters:  04/24/16 5\' 3"  (1.6 m)    Weight:   Wt Readings from Last 1 Encounters:  04/24/16 126 lb 9.6 oz (57.4 kg)    Ideal Body Weight:  52.27 kg  BMI:  Body mass  index is 22.43 kg/m.  Estimated Nutritional Needs:   Kcal:  1550-1750  Protein:  70-80 grams  Fluid:  1.5 - 1.7 L/day  EDUCATION NEEDS:   Education needs addressed  Roslyn Smiling, MS, RD, LDN Pager # [phone] After hours/ weekend pager # [phone]

## 2016-05-01 NOTE — Plan of Care (Signed)
Problem: RH Balance Goal: LTG Patient will maintain dynamic standing with ADLs (OT) LTG:  Patient will maintain dynamic standing balance with assist during activities of daily living (OT)   Upgraded -ESD 2/27  Problem: RH Toilet Transfers Goal: LTG Patient will perform toilet transfers w/assist (OT) LTG: Patient will perform toilet transfers with assist, with/without cues using equipment (OT)  Goal upgraded -ESD 2/27  Comments: Goals upgraded 2/27 ESD

## 2016-05-01 NOTE — Progress Notes (Signed)
Occupational Therapy Session Note  Patient Details  Name: [facility] MRN: [id number] Date of Birth: 07-26-1960  Today's Date: 05/01/2016 OT Individual Time: 1100-1200 OT Individual Time Calculation (min): 60 min   Short Term Goals: Week 1:  OT Short Term Goal 1 (Week 1): Pt will complete LB dressing with overall Mod A OT Short Term Goal 2 (Week 1): Pt will tolerate 3 minutes of standing activity with Min A for balance OT Short Term Goal 3 (Week 1): Pt will complete squat-pivot toilet transfer with Mod A of 1 caregiver  Skilled Therapeutic Interventions/Progress Updates:    1:1 OT session focused on activity tolerance, modified bathing/dressing, and energy conservation. Sit<>stand with close supervision and verbal cues for hand placement. Pt ambulated from bed to bathroom w/ RW and close supervision to transfer onto toilet. Pt voided successfully, toileted without assistance, then needed min A to doff pants. Pt ambulated to shower w/ RW and supervision. Bathing completed with overall supervision and increased time 2/2 fatigue. Pt ambulated back to toilet after shower for + Bm, set-up for peri-care. Educated on LB dressing techniques including friction reducing device to don TED hose. LB dressing completed with overall set-up/supervision. Pt declined to stay up for lunch despite encouragement and transferred back to bed stand-pivot w/ supervision. Pt left with needs met.   Therapy Documentation Precautions:  Precautions Precautions: Fall Restrictions Weight Bearing Restrictions: No Pain: Pain Assessment Pain Assessment: No/denies pain Pain Score: 0-No pain ADL: ADL ADL Comments: Please see functional navigator  See Function Navigator for Current Functional Status.   Therapy/Group: Individual Therapy  [name] 05/01/2016, 11:16 AM

## 2016-05-02 ENCOUNTER — Inpatient Hospital Stay (HOSPITAL_COMMUNITY): Payer: Managed Care, Other (non HMO)

## 2016-05-02 ENCOUNTER — Inpatient Hospital Stay (HOSPITAL_COMMUNITY): Payer: Managed Care, Other (non HMO) | Admitting: Physical Therapy

## 2016-05-02 ENCOUNTER — Inpatient Hospital Stay (HOSPITAL_COMMUNITY): Payer: Managed Care, Other (non HMO) | Admitting: Occupational Therapy

## 2016-05-02 LAB — URINE CULTURE: Culture: NO GROWTH

## 2016-05-02 MED ORDER — NITROFURANTOIN MONOHYD MACRO 100 MG PO CAPS
100.0000 mg | ORAL_CAPSULE | Freq: Two times a day (BID) | ORAL | Status: DC
  Administered 2016-05-02 (×2): 100 mg via ORAL
  Filled 2016-05-02 (×2): qty 1

## 2016-05-02 NOTE — Patient Care Conference (Signed)
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 Urinary retention   . Slow transit constipation   . Yeast infection   . Acute lower UTI   . Other complicated headache syndrome   . Acute blood loss anemia   . Reactive hypertension   . Multiple sclerosis exacerbation (HCC) 04/24/2016  . Neurogenic bladder 04/24/2016  . Weakness of both arms   . Weakness of both legs   . Sepsis (HCC) 04/21/2016  . Bilateral hydronephrosis 04/21/2016  . ARF (acute renal failure) (HCC) 04/21/2016  . Sepsis secondary to UTI (HCC) 04/20/2016  . Attention deficit disorder 02/23/2016  . Gait disturbance 11/24/2015  . Other fatigue 11/24/2015  . Urinary urgency 11/24/2015  . Dehydration   . UTI (lower urinary tract infection) 10/29/2015  . Leukocytosis 10/29/2015  . Nausea and vomiting 10/29/2015  . MS (multiple sclerosis) (HCC) 10/26/2014    Expected Discharge Date: Expected Discharge Date: 05/05/16  Team Members Present: Physician leading conference: Dr. Maryla Morrow Social Worker Present: Dossie Der, LCSW Nurse Present: Chana Bode, RN PT Present: Bayard Hugger, PT OT Present: Kearney Hard, OT SLP Present: Other (comment) (Happi Overton-SP) PPS Coordinator present : Tora Duck, RN, CRRN     Current Status/Progress Goal Weekly Team Focus  Medical   Tetraplegia secondary to MS exacerbation related to Urosepsis.    Improve mobility, safety, transfers, ?UTI  See above   Bowel/Bladder   Continent of bowel. LBM 05/01/16. Continues to requires I&O cath q 8hrs  Managed bladder program  Monitor   Swallow/Nutrition/ Hydration             ADL's   Supervision overall  Mod I/supervision overall  general strengthening, activity tolerance, pt/family ed, dc planning   Mobility   supervision overall  Mod I - supervision   activity tolerance, Pt education on energy conservation and remaining active, functional mobility,   Communication             Safety/Cognition/ Behavioral Observations            Pain   No c/o pain  <3  Monitor for nonverbal cues of pain   Skin   Buttocks red. Barrier cream applied to area, and pt to encouraged to change position  Skin CDI  Encourage to turn q 2hrs. Begin to encourage family to learn to I&O cath      *See Care Plan and progress notes for long and short-term goals.  Barriers to Discharge: Anxiety, neuogenic bladder, ?recurrent UTI, safety, mobility    Possible Resolutions to Barriers:  Therapies. empiric abx for UTI, follow bladder function    Discharge Planning/Teaching Needs:  Home with husband who can work from home for a short time to provide supervision level.       Team Discussion:  Goals supervision-mod/i level. No speech needed back to baseline. MD checking UTI. RN will begin I & O cath  teaching for pt and husband. Pt concerned about bowels and feeling nauseous. Discussing OP versus HH therapies. PT to do vestibular eval.  Revisions to Treatment Plan:  Upgraded goals to mod/i level  DC 3/3   Continued Need for Acute Rehabilitation Level of Care: The patient requires daily medical management by a physician with specialized training in physical medicine and rehabilitation for the following conditions: Daily direction of a multidisciplinary physical rehabilitation program to ensure safe treatment while eliciting the highest outcome that is of practical value  to the patient.: Yes Daily medical management of patient stability for increased activity during participation in an intensive rehabilitation regime.: Yes Daily analysis of laboratory values and/or radiology reports with any subsequent need for medication adjustment of medical intervention for : Neurological problems;Urological problems  Xavien Dauphinais, Lemar Livings 05/02/2016, 3:21 PM

## 2016-05-02 NOTE — Progress Notes (Signed)
[facility]IC[facility] RE[facility]ILITATION     PROGRESS NOTE  Subjective/Complaints:  Pt seen laying in bed this AM.  She slept well overnight, but complains of nausea this AM.  She has not asked for any medications yet.  ROS:  +nausea. Denies CP, SOB, D.  Objective: Vital Signs: Blood pressure 131/80, pulse 99, temperature 97.9 F (36.6 C), temperature source Oral, resp. rate 18, height 5\' 3"  (1.6 m), weight 57.4 kg (126 lb 9.6 oz), last menstrual period 10/25/2015, SpO2 98 %. No results found. No results for input(s): WBC, HGB, HCT, PLT in the last 72 hours. No results for input(s): NA, K, CL, GLUCOSE, BUN, CREATININE, CALCIUM in the last 72 hours.  Invalid input(s): CO CBG (last 3)  No results for input(s): GLUCAP in the last 72 hours.  Wt Readings from Last 3 Encounters:  04/24/16 57.4 kg (126 lb 9.6 oz)  04/21/16 54.1 kg (119 lb 4.3 oz)  02/23/16 59.2 kg (130 lb 8 oz)    Physical Exam:  BP 131/80 (BP Location: Left Arm)   Pulse 99   Temp 97.9 F (36.6 C) (Oral)   Resp 18   Ht 5\' 3"  (1.6 m)   Wt 57.4 kg (126 lb 9.6 oz)   LMP 10/25/2015   SpO2 98%   BMI 22.43 kg/m  Constitutional: NAD. Vital signs reviewed. Well-developed.  HENT: Normocephalicand atraumatic.  Eyes: EOMI. No discharge.  Cardiovascular: RRR. No JVD. Respiratory: Unlabored. Clear.  GI: Soft. Bowel sounds are normal.  Musculoskeletal: She exhibits no edema, no tenderness.  Neurological: Alert and oriented x3 with some confusion. RUE 4/5prox to distal.  RLE 2+/5 HF, 4/5 KE, 4/5 distally  LUE 4/5 proximal to distal.  LLE 3-/5 HF, 4/5 KE, 4/5 distally  Skin. Warm and dry Psych: Anxious. Flat  Assessment/Plan: 1. Functional deficits secondary to MS exacerbation which require 3+ hours per day of interdisciplinary therapy in a comprehensive inpatient rehab setting. Physiatrist is providing close team supervision and 24 hour management of active medical problems listed below. Physiatrist and rehab  team continue to assess barriers to discharge/monitor patient progress toward functional and medical goals.  Function:  Bathing Bathing position Bathing activity did not occur: Refused Position: Shower  Bathing parts Body parts bathed by patient: Right arm, Left arm, Abdomen, Chest, Front perineal area, Buttocks, Left upper leg, Right lower leg, Left lower leg, Right upper leg Body parts bathed by helper: Right upper leg, Right lower leg, Left upper leg, Left lower leg, Back  Bathing assist Assist Level: Set up, Supervision or verbal cues   Set up : To obtain items  Upper Body Dressing/Undressing Upper body dressing   What is the patient wearing?: Pull over shirt/dress     Pull over shirt/dress - Perfomed by patient: Thread/unthread right sleeve, Thread/unthread left sleeve, Pull shirt over trunk, Put head through opening Pull over shirt/dress - Perfomed by helper: Put head through opening, Pull shirt over trunk        Upper body assist Assist Level: Set up   Set up : To obtain clothing/put away  Lower Body Dressing/Undressing Lower body dressing   What is the patient wearing?: Non-skid slipper socks, Ted Hose, Pants     Pants- Performed by patient: Thread/unthread right pants leg, Thread/unthread left pants leg, Fasten/unfasten pants Pants- Performed by helper: Thread/unthread left pants leg, Thread/unthread right pants leg Non-skid slipper socks- Performed by patient: Don/doff right sock, Don/doff left sock Non-skid slipper socks- Performed by helper: Don/doff right sock, Don/doff  left sock Socks - Performed by patient: Don/doff right sock, Don/doff left sock   Shoes - Performed by patient: Don/doff right shoe, Fasten right Shoes - Performed by helper: Fasten left, Don/doff left shoe   AFO - Performed by helper: Don/doff left AFO TED Hose - Performed by patient: Don/doff right TED hose, Don/doff left TED hose TED Hose - Performed by helper: Don/doff right TED hose, Don/doff  left TED hose  Lower body assist Assist for lower body dressing: Supervision or verbal cues, Set up   Set up : To obtain clothing/put away  Toileting Toileting Toileting activity did not occur: No continent bowel/bladder event Toileting steps completed by patient: Performs perineal hygiene, Adjust clothing prior to toileting, Adjust clothing after toileting Toileting steps completed by helper: Adjust clothing prior to toileting, Performs perineal hygiene, Adjust clothing after toileting Toileting Assistive Devices: Grab bar or rail  Toileting assist Assist level: Set up/obtain supplies   Transfers Chair/bed transfer   Chair/bed transfer method: Stand pivot Chair/bed transfer assist level: Supervision or verbal cues Chair/bed transfer assistive device: Biochemist, clinical lift: Landscape architect Ambulation activity did not occur: Safety/medical concerns   Max distance: 110 ft Assist level: Supervision or verbal cues   Wheelchair   Type: Manual Max wheelchair distance: 60 ft Assist Level: Supervision or verbal cues  Cognition Comprehension Comprehension assist level: Follows basic conversation/direction with no assist  Expression Expression assist level: Expresses basic needs/ideas: With no assist  Social Interaction Social Interaction assist level: Interacts appropriately with others with medication or extra time (anti-anxiety, antidepressant).  Problem Solving Problem solving assist level: Solves basic 75 - 89% of the time/requires cueing 10 - 24% of the time  Memory Memory assist level: Recognizes or recalls 90% of the time/requires cueing < 10% of the time    Medical Problem List and Plan: 1. 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 Continue Dimethyl Fumarate for multiple sclerosis  Cont CIR 2. DVT Prophylaxis/Anticoagulation: SCDs.    Vascular study neg for DVT 3. Pain Management: Hydrocodone as needed 4. Mood: Valium 5 mg every 12 hours  as needed anxiety 5. Neuropsych: This patient iscapable of making decisions on herown behalf. 6. Skin/Wound Care: Routine skin checks 7. Fluids/Electrolytes/Nutrition: Routine I&O s 8.Neurogenic bladder. Follow-up urology services  Retention, will consider tx after UTI 9.MRSA UTI. Completed Vantin 200 mg every 12 hours. Continue contact precautions 10.Acute renal failure/bilateral hydronephrosis.  Follow-up urology services  Cr WNL 2/21 11. HTN  Controlled 2/28  Monitor with increased mobility 12. ABLA  Hb 9.5 on 2/21  Cont to monitor 13. Headache  Excedrin ordered, pt states this is effective at home  Improved 14. Nausea  Zofran PRN 15. Yeast infection  Diflucan ordered 2/26 16. Constipation  Bowel reg increased on 2/26  Improving 17. Suspected UTI  Repeat UA+, Ucx pending  Empiric Macrobid started 2/27   LOS (Days) 8 A FACE TO FACE EVALUATION WAS PERFORMED  Crystal Park 05/02/2016 8:51 AM

## 2016-05-02 NOTE — Progress Notes (Signed)
Occupational Therapy Session Note  Patient Details  Name: Crystal Park MRN: [id number] Date of Birth: [birth year]  Today's Date: 05/02/2016 OT Individual Time: 7829-5621 OT Individual Time Calculation (min): 75 min   Short Term Goals: Week 1:  OT Short Term Goal 1 (Week 1): Pt will complete LB dressing with overall Mod A OT Short Term Goal 2 (Week 1): Pt will tolerate 3 minutes of standing activity with Min A for balance OT Short Term Goal 3 (Week 1): Pt will complete squat-pivot toilet transfer with Mod A of 1 caregiver  Skilled Therapeutic Interventions/Progress Updates:    OT session focused on activity tolerance, LB dressing, and activity modifications. Pt required encouragement to get out of bed this am, reporting continued nausea and fatigue. Ambulatory w/ RW to bathroom  W/ supervision, pt unable to void. Pt able to manage clothing before and after with supervision. Addressed standing balance/endurance during grooming tasks- tolerated  5 minutes standing grooming. Addressed LB dressing techniques and energy conservation within ADL. Pt able to recall LB dressing modifications to don TED hose, socks, and tennis shoes w/ L foot-up brace with set-up A and min verbal cues. Extended rest break after LB dressing, then pt ambulated 50 ft in hallway w/ RW and supervision. Returned to room at end of session and left with RN present.   Therapy Documentation Precautions:  Precautions Precautions: Fall Restrictions Weight Bearing Restrictions: No Vital Signs: Therapy Vitals Patient Position (if appropriate): Orthostatic Vitals  118/86 sitting 107/85 standing  Pain: Pain Assessment Pain Assessment: 0-10 Pain Score: 7  Pain Type: Acute pain Pain Location: Abdomen Pain Orientation: Mid;Lower Pain Descriptors / Indicators: Sore Pain Onset: Gradual Pain Intervention(s): Repositioned ADL: ADL ADL Comments: Please see functional navigator See Function Navigator for Current Functional  Status.  Therapy/Group: Individual Therapy  [name] 05/02/2016, 8:41 AM

## 2016-05-02 NOTE — Progress Notes (Signed)
Physical Therapy Session Note  Patient Details  Name: Crystal Park MRN: [id number] Date of Birth: 10-Apr-1960  Today's Date: 05/02/2016 PT Individual Time: 1000-1115 PT Individual Time Calculation (min): 75 min   Short Term Goals: Week 1:  PT Short Term Goal 1 (Week 1): Pt will ambulate 15 ft with RW min A PT Short Term Goal 2 (Week 1): Pt will perform supine <->sit with mod A  PT Short Term Goal 3 (Week 1): Pt will perform bed rolling min A  PT Short Term Goal 4 (Week 1): Pt will demonstrate chair/bed transfer min A 50% of time with min cues using RW  Skilled Therapeutic Interventions/Progress Updates:  Pt was supine in bed upon arrival. Pt required supervision with transitioning to EOB and donning running shoes. Min A was needed to attach footup orthosis on L foot. Pt ambulated 80 ft and 70 ft with RW supervision with improvement in L foot clearance noted. Pt required occasional verbal cueing for R foot heel strike. Pt ascended and descended forward on 4 six inch steps using BUE support with supervision level of assistance several times. Pt required extended rest breaks between each trial. Pt was returned to room and required supervision to stand pivot into bed using RW and transition into supine. Pt performed supine therapeutic exercises that include: 1x10 heel slides bilaterally, 1x10 LAQ with pause at end range bilaterally, 1x10 isometric hip abductions with 5 sec holds, 1x10 quad sets with 5 sec holds. Pt was left in bed with all needs within reach. Pt continued to need frequent and prolonged rest breaks due to fatigue. Pt also expressed still having dizziness with activity so vestibular evaluation was ordered for 3/1.      Therapy Documentation Precautions:  Precautions Precautions: Fall Restrictions Weight Bearing Restrictions: No Pain: Pain Assessment Pain Assessment: No/denies pain Pain Score: 0-No pain Mobility:   Locomotion :    Trunk/Postural Assessment :     Balance:   Exercises:   Other Treatments:     See Function Navigator for Current Functional Status.   Therapy/Group: Individual Therapy  Rudie Meyer 05/02/2016, 2:19 PM

## 2016-05-02 NOTE — Progress Notes (Signed)
Occupational Therapy Session Note  Patient Details  Name: Crystal Park MRN: [id number] Date of Birth: [birth year]  Today's Date: 05/02/2016 OT Individual Time: 1418-1500 42 min   Short Term Goals: Week 1:  OT Short Term Goal 1 (Week 1): Pt will complete LB dressing with overall Mod A OT Short Term Goal 2 (Week 1): Pt will tolerate 3 minutes of standing activity with Min A for balance OT Short Term Goal 3 (Week 1): Pt will complete squat-pivot toilet transfer with Mod A of 1 caregiver  Skilled Therapeutic Interventions/Progress Updates:    Pt supine in bed upon arrival reporting fatigue, no pain and agreeable to tx. Pt dons B shoes with increased time and set up. Pt required A to attach orthosis. Pt ambulates to bathroom with RW with supervision and VC for safety awareness. Pt stands at sink to wash hands and brush teeth with SET Up. Ambulates to/from day room with seated rest breaks to improve endurance. Pt educated on energy conservation strategies and provided with walker bag to carry items. Pt ties walker bag onto walker and is able to identify household items she would put in there to eliminate excess trips throughout house. Pt returns to room seated in bed with call light in reach and all needs met.   Therapy Documentation Precautions:  Precautions Precautions: Fall Restrictions Weight Bearing Restrictions: No Exercises:   Other Treatments:    See Function Navigator for Current Functional Status.   Therapy/Group: Individual Therapy  Tonny Branch 05/02/2016, 3:15 PM

## 2016-05-03 ENCOUNTER — Encounter: Payer: Self-pay | Admitting: Neurology

## 2016-05-03 ENCOUNTER — Inpatient Hospital Stay (HOSPITAL_COMMUNITY): Payer: Managed Care, Other (non HMO) | Admitting: Physical Therapy

## 2016-05-03 ENCOUNTER — Inpatient Hospital Stay (HOSPITAL_COMMUNITY): Payer: Managed Care, Other (non HMO) | Admitting: Occupational Therapy

## 2016-05-03 DIAGNOSIS — R319 Hematuria, unspecified: Secondary | ICD-10-CM

## 2016-05-03 LAB — CBC
HCT: 33.6 % — ABNORMAL LOW (ref 36.0–46.0)
Hemoglobin: 10.7 g/dL — ABNORMAL LOW (ref 12.0–15.0)
MCH: 27.9 pg (ref 26.0–34.0)
MCHC: 31.8 g/dL (ref 30.0–36.0)
MCV: 87.7 fL (ref 78.0–100.0)
Platelets: 602 10*3/uL — ABNORMAL HIGH (ref 150–400)
RBC: 3.83 MIL/uL — ABNORMAL LOW (ref 3.87–5.11)
RDW: 14.9 % (ref 11.5–15.5)
WBC: 9.3 10*3/uL (ref 4.0–10.5)

## 2016-05-03 NOTE — Progress Notes (Signed)
Pt's bladder was scanned at 0300 w/over 300 mL detected.  Pt was further educated about self catheterization and watched with a handheld mirror while we did and in and out cath on her, which obtained 450 tea colored to frank blood and clotted urine.  Pt started on her Macrobid this evening.  Pt has been afebrile.    Pt expressed some doubts as to ability to self cath, and this RN as well as CNA had to attempt 3 times to successfully cath pt.  The tries were used as a learning for the patient who observed.  The goal is for pt to attempt to cath herself next time and begin to develop some confidence in her ability to acquire this new skill.  Tera Helper

## 2016-05-03 NOTE — Progress Notes (Addendum)
[facility]ILIT[facility]     PROGRESS NOTE  Subjective/Complaints:  Pt seen laying in bed this AM.  She states she is "okay".  She slept well overnight.  She complains of vaginal burning.   ROS:  +vaginal burning. Denies CP, SOB, D.  Objective: Vital Signs: Blood pressure (!) 146/80, pulse 83, temperature 98.3 F (36.8 C), temperature source Oral, resp. rate 18, height 5\' 3"  (1.6 m), weight 57.4 kg (126 lb 9.6 oz), last menstrual period 10/25/2015, SpO2 100 %. No results found. No results for input(s): WBC, HGB, HCT, PLT in the last 72 hours. No results for input(s): NA, K, CL, GLUCOSE, BUN, CREATININE, CALCIUM in the last 72 hours.  Invalid input(s): CO CBG (last 3)  No results for input(s): GLUCAP in the last 72 hours.  Wt Readings from Last 3 Encounters:  04/24/16 57.4 kg (126 lb 9.6 oz)  04/21/16 54.1 kg (119 lb 4.3 oz)  02/23/16 59.2 kg (130 lb 8 oz)    Physical Exam:  BP (!) 146/80 (BP Location: Right Arm)   Pulse 83   Temp 98.3 F (36.8 C) (Oral)   Resp 18   Ht 5\' 3"  (1.6 m)   Wt 57.4 kg (126 lb 9.6 oz)   LMP 10/25/2015   SpO2 100%   BMI 22.43 kg/m  Constitutional: NAD. Vital signs reviewed. Well-developed.  HENT: Normocephalicand atraumatic.  Eyes: EOMI. No discharge.  Cardiovascular: RRR. No JVD. Respiratory: Unlabored. Clear.  GI: Soft. Bowel sounds are normal.  Musculoskeletal: She exhibits no edema, no tenderness.  Neurological: Alert and oriented x3 with some confusion. RUE 4/5prox to distal.  RLE 3/5 HF, 4/5 KE, 4/5 distally  LUE 4/5 proximal to distal.  LLE 4-/5 HF, 4/5 KE, 4/5 distally  Skin. Warm and dry Psych: Anxious. Flat  Assessment/Plan: 1. Functional deficits secondary to MS exacerbation which require 3+ hours per day of interdisciplinary therapy in a comprehensive inpatient rehab setting. Physiatrist is providing close team supervision and 24 hour management of active medical problems listed below. Physiatrist and  rehab team continue to assess barriers to discharge/monitor patient progress toward functional and medical goals.  Function:  Bathing Bathing position Bathing activity did not occur: Refused Position: Shower  Bathing parts Body parts bathed by patient: Right arm, Left arm, Abdomen, Chest, Front perineal area, Buttocks, Left upper leg, Right lower leg, Left lower leg, Right upper leg Body parts bathed by helper: Right upper leg, Right lower leg, Left upper leg, Left lower leg, Back  Bathing assist Assist Level: Set up, Supervision or verbal cues   Set up : To obtain items  Upper Body Dressing/Undressing Upper body dressing   What is the patient wearing?: Pull over shirt/dress     Pull over shirt/dress - Perfomed by patient: Thread/unthread right sleeve, Thread/unthread left sleeve, Pull shirt over trunk, Put head through opening Pull over shirt/dress - Perfomed by helper: Put head through opening, Pull shirt over trunk        Upper body assist Assist Level: Set up   Set up : To obtain clothing/put away  Lower Body Dressing/Undressing Lower body dressing   What is the patient wearing?: Shoes, AFO     Pants- Performed by patient: Thread/unthread right pants leg, Thread/unthread left pants leg, Pull pants up/down Pants- Performed by helper: Thread/unthread left pants leg, Thread/unthread right pants leg Non-skid slipper socks- Performed by patient: Don/doff right sock, Don/doff left sock Non-skid slipper socks- Performed by helper: Don/doff right sock, Don/doff left sock  Socks - Performed by patient: Don/doff right sock, Don/doff left sock   Shoes - Performed by patient: Don/doff right shoe, Don/doff left shoe, Fasten left, Fasten right Shoes - Performed by helper: Fasten left, Don/doff left shoe   AFO - Performed by helper: Don/doff left AFO TED Hose - Performed by patient: Don/doff right TED hose, Don/doff left TED hose TED Hose - Performed by helper: Don/doff right TED hose,  Don/doff left TED hose  Lower body assist Assist for lower body dressing: Touching or steadying assistance (Pt > 75%)   Set up : To obtain clothing/put away  Toileting Toileting Toileting activity did not occur: No continent bowel/bladder event Toileting steps completed by patient: Adjust clothing prior to toileting, Performs perineal hygiene, Adjust clothing after toileting Toileting steps completed by helper: Adjust clothing prior to toileting, Performs perineal hygiene, Adjust clothing after toileting Toileting Assistive Devices: Grab bar or rail  Toileting assist Assist level: Supervision or verbal cues   Transfers Chair/bed transfer   Chair/bed transfer method: Stand pivot Chair/bed transfer assist level: Supervision or verbal cues Chair/bed transfer assistive device: Walker, Armrests Mechanical lift: Landscape architect Ambulation activity did not occur: Safety/medical concerns   Max distance: 80 ft Assist level: Supervision or verbal cues   Wheelchair   Type: Manual Max wheelchair distance: 60 ft Assist Level: Supervision or verbal cues  Cognition Comprehension Comprehension assist level: Understands complex 90% of the time/cues 10% of the time  Expression Expression assist level: Expresses basic needs/ideas: With no assist  Social Interaction Social Interaction assist level: Interacts appropriately 90% of the time - Needs monitoring or encouragement for participation or interaction.  Problem Solving Problem solving assist level: Solves basic 75 - 89% of the time/requires cueing 10 - 24% of the time  Memory Memory assist level: Recognizes or recalls 90% of the time/requires cueing < 10% of the time    Medical Problem List and Plan: 1. 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 Continue Dimethyl Fumarate for multiple sclerosis  Cont CIR 2. DVT Prophylaxis/Anticoagulation: SCDs.    Vascular study neg for DVT 3. Pain Management: Hydrocodone  as needed 4. Mood: Valium 5 mg every 12 hours as needed anxiety 5. Neuropsych: This patient iscapable of making decisions on herown behalf. 6. Skin/Wound Care: Routine skin checks 7. Fluids/Electrolytes/Nutrition: Routine I&O s 8.Neurogenic bladder.   Will speak to Urology regarding, evaluation for retention and RBCs  Retention 9.MRSA UTI. Completed Vantin 200 mg every 12 hours. Continue contact precautions 10.Acute renal failure/bilateral hydronephrosis.  Follow-up urology services  Cr WNL 2/21 11. HTN  Relatively controlled 3/1  Monitor with increased mobility 12. ABLA  Hb 9.5 on 2/21  Labs ordered  Cont to monitor 13. Headache  Excedrin ordered, pt states this is effective at home  Improved 14. Nausea  Zofran PRN 15. Yeast infection  Diflucan ordered 2/26 16. Constipation  Bowel reg increased on 2/26  Improving  LOS (Days) 9 A FACE TO FACE EVALUATION WAS PERFORMED  Valor Quaintance Karis Juba 05/03/2016 8:42 AM

## 2016-05-03 NOTE — Progress Notes (Signed)
Social Work Patient ID: Crystal Park, female   DOB: [birth year], 56 y.o.   MRN: [id number]  Met again with husband after he spoke with Dan-PA and therapists. He has begun I & O cath teaching and would like a HHRN For the first week then transition to OP therapies. Both he and pt to be taught how to I & O cath her. Discussed Home Health and will begin the process through [facility], information faxed to begin the referral process. Husband feels better about discharging on Sat after seeing her in therapies. Will continue to work on discharge needs.

## 2016-05-03 NOTE — Progress Notes (Signed)
I&O cath at 1130. Pt scheduled to be cath again at  0530. Voided x 1 in toilet @1700 . Bladder scanned for 400. Pt  cath for 350 ml of dark, blood tinged urine. Unable to allow pt to cath, due to difficulty locating the urethra. Husband at bedside observing. Education provided.

## 2016-05-03 NOTE — Progress Notes (Signed)
Physical Therapy Session Note  Patient Details  Name: Crystal Park MRN: [id number] Date of Birth: 11-07-1960  Today's Date: 05/03/2016 PT Individual Time: 1300-1430 PT Individual Time Calculation (min): 90 min   Short Term Goals: Week 1:  PT Short Term Goal 1 (Week 1): Pt will ambulate 15 ft with RW min A PT Short Term Goal 2 (Week 1): Pt will perform supine <->sit with mod A  PT Short Term Goal 3 (Week 1): Pt will perform bed rolling min A  PT Short Term Goal 4 (Week 1): Pt will demonstrate chair/bed transfer min A 50% of time with min cues using RW  Skilled Therapeutic Interventions/Progress Updates:  Pt was supine in bed upon arrival. Pt performed bed rolling and sit<->supine to R and L with bed flat and no use of bedrails Mod I. Pt required supervision to stand up from EOB and ambulate 10 ft to W/C. Pt self-propelled W/C towards rehab gym supervision with max distance achieved between frequent rest breaks was 70 ft. Pt performed the DGI and scored 12/25 indicating an increased risk for falling. Pt described having increased dizziness with vertical and horizontal head turning tasks of the DGI with vertical head movements resulting in worse symptoms. Pt required to use the restroom so pt was returned to room and required supervision to ambulate 9 ft to commode, doff pants, and transfer onto elevated commode. Pt required supervision with perineal hygiene, donning pants with new briefs donned dependently, and return to W/C using RW . Pt performed 3 x 4 six inch steps with supervision and extended rest breaks in between trials. Pt was pushed back to room and required supervision to stand pivot onto EOB and Mod I to transition to supine in bed with bed flat and no use of bed rails. Pt was left in bed with HOB elevated, all needs within reach, and with bedrails up. No complaint of pain throughout therapy.      Therapy Documentation Precautions:  Precautions Precautions:  Fall Restrictions Weight Bearing Restrictions: No Balance: Standardized Balance Assessment Standardized Balance Assessment: Dynamic Gait Index Dynamic Gait Index Level Surface: Mild Impairment Change in Gait Speed: Mild Impairment Gait with Horizontal Head Turns: Mild Impairment Gait with Vertical Head Turns: Moderate Impairment Gait and Pivot Turn: Moderate Impairment Step Over Obstacle: Moderate Impairment Step Around Obstacles: Mild Impairment Steps: Moderate Impairment Total Score: 12  See Function Navigator for Current Functional Status.   Therapy/Group: Individual Therapy  Rudie Meyer 05/03/2016, 3:45 PM

## 2016-05-03 NOTE — Progress Notes (Addendum)
Social Work Patient ID: Crystal Park, Crystal Park   DOB: 1961-01-09, 56 y.o.   MRN: [id number]  Met with pt and husband when here to answer questions and husband expressed concerns feels discharge is too soon. He has medical questions about the UA results, pt's nausea and dizziness and feeling ready to go home on Sat. Have asked dan-PA to address medical questions and PT to discuss reasoning for setting discharge date. Discussed follow up and equipment. They have done both home health and OP and feel OP is more beneficial. [facility]. Will work on discharge plans and see if concerns addressed. RN also working on I & O cath training.

## 2016-05-03 NOTE — Progress Notes (Signed)
Physical Therapy Session Note  Patient Details  Name: Crystal Park MRN: [id number] Date of Birth: [birth year]  Today's Date: 05/03/2016 PT Individual Time: 1004-1100 PT Individual Time Calculation (min): 56 min   Skilled Therapeutic Interventions/Progress Updates:  1.   History and Physical Examination   History of Present Illness:  Pt reports longstanding history of dizziness.  Reports that she gets dizzy with sitting too long and when standing up.  Reports that she feels lightheaded and off-balance when the sensation occurs and that this lasts about 30 minutes.  Per pt husband, pt previously had vestibular testing (formal) which showed central origin of complaints.              Pain:  Reports abdominal pain at onset of session, nursing aware.  Glasses: Y  Antivertiginous Medications:  Per huband, takes Valium at home for dizziness  Blood Pressure: Supine_139/88_________Sitting_129/83__________Standing__122/80________  Strength, Coordination and Sensation:  See initial evaluation, pt has dx of MS   2. Vestibular Assessment                           Gross neck ROM  Significant limitations all planes of movement with increased dizziness all directions.  Eye Alignment Not tested  Oculomotor ROM WFL  Spontaneous  Nystagmus (room light and vision occluded) WFL  Gaze holding nystagmus(room light and vision occluded) Direction changing nystagmus with right and left gaze  Smooth pursuit Saccadic intrusions present  Saccades L eye slow to abduct  Vergence L eye does not abduct well  Dynamic Visual Acuity         1 line difference; tested with glasses donned and slow head movements  Rt. Hallpike Dix Negative for sx or nystagmus  Lt. Hallpike Dix Negative for sx or nystagmus  Rt. Roll Test Negative for sx or nystagmus  Lt. Roll Test  Negative for sx or nystagmus   3. Assessment of Gait and Balance(static and dynamic): Pt stands on firm ground with feet together, EO x 30 sec, and  with EC x 16sec.  Pt shows decreased willingness to turn head side to side during ambulation.  Additionally, shows decreased L ankle DF in mid and terminal swing.   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  Therapist discussed with pt social worker that the pt may benefit from neuro-opthamology assessment to see if any visual accomodations for above deficits may have a positive impact on her symptoms and function.    4. Recommendations for Treatment: Diaphragmatic breathing exercises, habituation exercises (active eye head movements, VOR X 1 (but for habituation vs adaptation), education on moving slowly due to decreasing BP with supine to sit, head turns in standing, head turns during gait, functioning in dark room (possibly apartment)  5. Education Provided:  Today, therapy focused on teaching pt diaphragmatic breathing exercises to assist in relaxation as well as ambulation with wheeled walker working on head turns and standing balance with narrow base of support as well as with eyes open and closed.  Additionally, educated pt on community resources such as [facility] in [place].    Therapy Documentation Precautions:  Precautions Precautions: Fall Restrictions Weight Bearing Restrictions: No     See Function Navigator for Current Functional Status.   Therapy/Group: Individual Therapy  Oseias Horsey Elveria Rising 05/03/2016, 12:15 PM

## 2016-05-03 NOTE — Progress Notes (Signed)
Occupational Therapy Session Note  Patient Details  Name: Crystal Park MRN: [id number] Date of Birth: 01-23-1961  Today's Date: 05/03/2016 OT Individual Time: 0806-0900 OT Individual Time Calculation (min): 54 min    Short Term Goals: Week 1:  OT Short Term Goal 1 (Week 1): Pt will complete LB dressing with overall Mod A OT Short Term Goal 2 (Week 1): Pt will tolerate 3 minutes of standing activity with Min A for balance OT Short Term Goal 3 (Week 1): Pt will complete squat-pivot toilet transfer with Mod A of 1 caregiver  Skilled Therapeutic Interventions/Progress Updates:    OT treatment provided with focus on activity tolerance during ADL/self-care tasks. Pt greeted in bed, reports improved medical status this am. Pt dons non-skid socks at EOB with increased time and set-up. VC for hand placement w/ sit<>stand, then ambulated to bathroom with supervision and turns to sit onto raised toilet. Pt unsuccessful w/ Bm. Doffs clothing seated on toilet, then ambulates 5 feet  W/ RW to shower w/ supervision. Bathing completed w/ set-up A. Reviewed home bathroom set-up and safe shower transfers in home environment. Pt dresses seated in wc at the sink with overall set-up A, increased time and 3 rest breaks after sit<>stands. Unable to persuade pt to stay up in w/c, so pt returns to bed with supervision.  Therapy Documentation Precautions:  Precautions Precautions: Fall Restrictions Weight Bearing Restrictions: No Pain: Pain Assessment Pain Assessment: 0-10 Pain Score: 3  Pain Type: Acute pain Pain Location: Abdomen Pain Orientation: Lower Pain Descriptors / Indicators: Sore Pain Onset: Gradual Pain Intervention(s): Repositioned ADL: ADL ADL Comments: Please see functional navigator  See Function Navigator for Current Functional Status.   Therapy/Group: Individual Therapy  [name] 05/03/2016, 3:50 PM

## 2016-05-04 ENCOUNTER — Inpatient Hospital Stay (HOSPITAL_COMMUNITY): Payer: Managed Care, Other (non HMO) | Admitting: Occupational Therapy

## 2016-05-04 ENCOUNTER — Inpatient Hospital Stay (HOSPITAL_COMMUNITY): Payer: Managed Care, Other (non HMO) | Admitting: Physical Therapy

## 2016-05-04 MED ORDER — SENNOSIDES-DOCUSATE SODIUM 8.6-50 MG PO TABS: 1.0000 | ORAL_TABLET | Freq: Every day | ORAL | Status: DC

## 2016-05-04 MED ORDER — HYDROCODONE-ACETAMINOPHEN 5-325 MG PO TABS: 1.0000 | ORAL_TABLET | Freq: Four times a day (QID) | ORAL | 0 refills | Status: DC | PRN

## 2016-05-04 MED ORDER — ASPIRIN-ACETAMINOPHEN-CAFFEINE 250-250-65 MG PO TABS: 1.0000 | ORAL_TABLET | Freq: Three times a day (TID) | ORAL | 0 refills | Status: DC | PRN

## 2016-05-04 MED ORDER — DIAZEPAM 5 MG PO TABS: 5.0000 mg | ORAL_TABLET | Freq: Two times a day (BID) | ORAL | 0 refills | Status: DC | PRN

## 2016-05-04 MED ORDER — BETHANECHOL CHLORIDE 10 MG PO TABS
10.0000 mg | ORAL_TABLET | Freq: Three times a day (TID) | ORAL | Status: DC
  Filled 2016-05-04: qty 1

## 2016-05-04 MED ORDER — POLYETHYLENE GLYCOL 3350 17 G PO PACK: 17.0000 g | PACK | Freq: Every day | ORAL | 0 refills | Status: DC

## 2016-05-04 MED ORDER — ONDANSETRON HCL 4 MG PO TABS: 4.0000 mg | ORAL_TABLET | Freq: Four times a day (QID) | ORAL | 0 refills | Status: DC | PRN

## 2016-05-04 NOTE — Progress Notes (Signed)
Physical Therapy Discharge Summary  Patient Details  Name: Crystal Park MRN: [id number] Date of Birth: [birth year]  Today's Date: 05/04/2016 PT Individual Time: 1135-1205 & 1330-1420 PT Individual Time Calculation (min): 30 min & 50 min   Patient has met 8 of 9 long term goals due to improved activity tolerance, improved balance, improved postural control, increased strength.  Patient to discharge at an ambulatory level Modified Independent - supervision.   Patient's care partner is independent to provide the necessary supervision assistance at discharge.  Reasons goals not met: N/A  Recommendation:  Patient will benefit from ongoing skilled PT services in outpatient setting to continue to advance safe functional mobility, address ongoing impairments in Vestibular impairments, activity tolerance, dynamic balance, energy conservation, and minimize fall risk.  Equipment: No equipment provided  Reasons for discharge: treatment goals met and discharge from hospital  Patient/family agrees with progress made and goals achieved: Yes   Pt intervention Session 1:  Pt was supine in bed upon arrival. With bed flat and no bedrails, pt was mod I with all bed mobility and chair/bed transfers. Pt ambulated 150 ft Mod I using RW. Pt was supervision with car transfer, ramp, and walking over uneven surface using Rw. Pt was returned to room and was mod I to stand pivot into bed to lie supine and rest. Pt was left with all needs within reach and wasn't complaining of any pain throughout therapy.   Session 2: Pt performed all bed mobility and transfers Mod I throughout therapy. Pt recorded an average gait speed of 0.3 m/s for 10 meter walk test using RW. Pt was supervision with ascending and descending 8 three inch steps and 4 six inch steps using BUE support. Pt performed the TUG Mod I with RW with an offical average time of two trials being 30 seconds, improving from 69 seconds supervision with RW on  2/22, however still indicating an increased risk for falling. Pt ambulated over and down curb using RW supervision with verbal cues for sequence and RW placement. Pt required supervision to pick up item from floor. Pt attempted HEP of diaphragmatic breathing in supine on rehab mat, but unable to perform tasks correctly. Pt provided handout and instructed to practice with husband. Pt was returned to bed with all needs within reach. Pt educated on HEP, return to previous activity level to prevent decline, and benefit on follow up with MS FIT. Pt complained of no pain throughout the session. Pt with no further questions regarding discharge.  PT Discharge Precautions/Restrictions Precautions Precautions: Fall Restrictions Weight Bearing Restrictions: No Pain Pain Assessment Pain Assessment: No/denies pain Pain Score: 0-No pain Vision/Perception  Baseline: wears glasses Wears Glasses: all the time Patient Visual Report: No change from baseline    Cognition Overall Cognitive Status: Within Functional Limits for tasks assessed Arousal/Alertness: Awake/alert Orientation Level: Oriented X4 Memory Impairment: Decreased short term memory Awareness: Appears intact Problem Solving: Appears intact Safety/Judgment: Appears intact Sensation Sensation Light Touch: Impaired Detail Light Touch Impaired Details: Impaired LLE;Impaired RLE Hot/Cold: Appears Intact Proprioception: Appears Intact Coordination Gross Motor Movements are Fluid and Coordinated: No Fine Motor Movements are Fluid and Coordinated: Yes Motor  Motor Motor: Within Functional Limits  Mobility Bed Mobility Bed Mobility: Rolling Right;Rolling Left;Right Sidelying to Sit;Left Sidelying to Sit;Supine to Sit;Sit to Supine Rolling Right: 6: Modified independent (Device/Increase time) Rolling Left: 6: Modified independent (Device/Increase time) Right Sidelying to Sit: 6: Modified independent (Device/Increase time) Left Sidelying to  Sit: 6: Modified independent (Device/Increase time) Supine  to Sit: 6: Modified independent (Device/Increase time) Sit to Supine: 6: Modified independent (Device/Increase time) Transfers Transfers: Yes Sit to Stand: 6: Modified independent (Device/Increase time) Stand to Sit: 6: Modified independent (Device/Increase time) Stand Pivot Transfers: 6: Modified independent (Device/Increase time) Locomotion  Ambulation Ambulation: Yes Ambulation/Gait Assistance: 6: Modified independent (Device/Increase time) Ambulation Distance (Feet): 150 Feet Assistive device: Rolling walker Gait Gait: Yes Gait Pattern: Step-through pattern;Poor foot clearance - right;Poor foot clearance - left Gait velocity: 0.3 m/s Stairs / Additional Locomotion Stairs: Yes Stairs Assistance: 5: Supervision Stair Management Technique: Two rails;Step to pattern;Alternating pattern (alternating pattern going up; step to pattern going down) Number of Stairs: 12 Height of Stairs: 3 Ramp: 5: Supervision Curb: 5: Supervision  Trunk/Postural Assessment  Cervical Assessment Cervical Assessment: Exceptions to [facility] (forward head) Thoracic Assessment Thoracic Assessment: Exceptions to [facility] (increased kyphotic posture) Lumbar Assessment Lumbar Assessment: Exceptions to [facility] (increased lumbar flexion and posterior pelvic tilt) Postural Control Postural Control: Deficits on evaluation (improved since evaluation)  Balance Standardized Balance Assessment Standardized Balance Assessment: Dynamic Gait Index Dynamic Gait Index Level Surface: Mild Impairment Change in Gait Speed: Mild Impairment Gait with Horizontal Head Turns: Mild Impairment Gait with Vertical Head Turns: Moderate Impairment Gait and Pivot Turn: Moderate Impairment Step Over Obstacle: Moderate Impairment Step Around Obstacles: Mild Impairment Steps: Moderate Impairment Total Score: 12 Timed Up and Go Test TUG: Normal TUG Normal TUG (seconds): 30 Static  Sitting Balance Static Sitting - Balance Support: No upper extremity supported Static Sitting - Level of Assistance: 6: Modified independent (Device/Increase time) Static Standing Balance Static Standing - Balance Support: Bilateral upper extremity supported Static Standing - Level of Assistance: 6: Modified independent (Device/Increase time) Dynamic Standing Balance Dynamic Standing - Balance Support: During functional activity;Bilateral upper extremity supported Dynamic Standing - Level of Assistance: 6: Modified independent (Device/Increase time) Dynamic Standing - Balance Activities: Forward lean/weight shifting Extremity Assessment  RUE Assessment RUE Assessment: Within Functional Limits RUE Strength RUE Overall Strength: Within Functional Limits for tasks performed RUE Overall Strength Comments: grossly 4/5 LUE Assessment LUE Assessment: Within Functional Limits LUE Strength LUE Overall Strength: Within Functional Limits for tasks assessed LUE Overall Strength Comments: grossly 4+/5 RLE Assessment RLE Assessment: Exceptions to [facility] RLE Strength RLE Overall Strength: Deficits Right Hip Flexion: 2+/5 Right Knee Flexion: 3+/5 Right Knee Extension: 4-/5 Right Ankle Dorsiflexion: 4-/5 Right Ankle Plantar Flexion: 4/5 LLE Assessment LLE Assessment: Exceptions to [facility] LLE Strength Left Hip Flexion: 4-/5 Left Knee Flexion: 4/5 Left Knee Extension: 4/5 Left Ankle Dorsiflexion: 4/5 Left Ankle Plantar Flexion: 4/5   See Function Navigator for Current Functional Status.  Rosendo Gros 05/04/2016, 2:42 PM

## 2016-05-04 NOTE — Progress Notes (Signed)
Social Work  Discharge Note  The overall goal for the admission was met for:   Discharge location: Yes-HOME WITH HUSBAND WHO CAN PROVIDE 24 HR SUPERVISION FOR THE SHORT TERM  Length of Stay: Yes-10 DAYS  Discharge activity level: Yes-SUPERVISION-MOD/I LEVEL  Home/community participation: Yes  Services provided included: MD, RD, PT, OT, RN, CM, Pharmacy and SW  Financial Services: Private Insurance: [employer]  Follow-up services arranged: Home Health: [facility]-PT,OT,RN and Patient/Family has no preference for HH/DME agencies  Comments (or additional information):ATTEMPTED SELF CATHING [place]. HUSBAND HERE Thursday TO OBSERVE IN THERAPIES AND BE EDUCATED REGARDING WHAT PT IS ABLE TO DO FOR HERSELF AND WHAT SHE NEEDS ASSISTANCE WITH. HAS ALL EQUIPMENT FROM PREVIOUS ADMITS.   Patient/Family verbalized understanding of follow-up arrangements: Yes  Individual responsible for coordination of the follow-up plan: ANDY-HUSBAND  Confirmed correct DME delivered: Elease Hashimoto 05/04/2016    Elease Hashimoto

## 2016-05-04 NOTE — Discharge Summary (Signed)
Discharge summary job [id number]

## 2016-05-04 NOTE — Progress Notes (Signed)
[facility]ILIT[facility]     PROGRESS NOTE  Subjective/Complaints:  Pt laying in bed this AM.  She keeps her eyes closed.  She states she feels "okay" and that she slept better. She states she has "a little headache" this AM.   ROS:  +Headache. Denies CP, SOB, N/V/D.  Objective: Vital Signs: Blood pressure 125/80, pulse 94, temperature 98 F (36.7 C), temperature source Oral, resp. rate 18, height 5\' 3"  (1.6 m), weight 57.4 kg (126 lb 9.6 oz), last menstrual period 10/25/2015, SpO2 96 %. No results found.  Recent Labs  05/03/16 1240  WBC 9.3  HGB 10.7*  HCT 33.6*  PLT 602*   No results for input(s): NA, K, CL, GLUCOSE, BUN, CREATININE, CALCIUM in the last 72 hours.  Invalid input(s): CO CBG (last 3)  No results for input(s): GLUCAP in the last 72 hours.  Wt Readings from Last 3 Encounters:  04/24/16 57.4 kg (126 lb 9.6 oz)  04/21/16 54.1 kg (119 lb 4.3 oz)  02/23/16 59.2 kg (130 lb 8 oz)    Physical Exam:  BP 125/80 (BP Location: Left Arm)   Pulse 94   Temp 98 F (36.7 C) (Oral)   Resp 18   Ht 5\' 3"  (1.6 m)   Wt 57.4 kg (126 lb 9.6 oz)   LMP 10/25/2015   SpO2 96%   BMI 22.43 kg/m  Constitutional: NAD. Vital signs reviewed. Well-developed.  HENT: Normocephalicand atraumatic.  Eyes: EOMI. No discharge.  Cardiovascular: RRR. No JVD. Respiratory: Unlabored. Clear.  GI: Soft. Bowel sounds are normal.  Musculoskeletal: She exhibits no edema, no tenderness.  Neurological: Alert and oriented x3 with some confusion. RUE 4/5prox to distal.  RLE 3/5 HF, 4/5 KE, 4/5 distally (stable) LUE 4/5 proximal to distal.  LLE 4-/5 HF, 4/5 KE, 4/5 distally  Skin. Warm and dry Psych: Flat  Assessment/Plan: 1. Functional deficits secondary to MS exacerbation which require 3+ hours per day of interdisciplinary therapy in a comprehensive inpatient rehab setting. Physiatrist is providing close team supervision and 24 hour management of active medical problems  listed below. Physiatrist and rehab team continue to assess barriers to discharge/monitor patient progress toward functional and medical goals.  Function:  Bathing Bathing position Bathing activity did not occur: Refused Position: Shower  Bathing parts Body parts bathed by patient: Right arm, Chest, Left arm, Abdomen, Front perineal area, Right upper leg, Buttocks, Left lower leg, Left upper leg, Right lower leg Body parts bathed by helper: Right upper leg, Right lower leg, Left upper leg, Left lower leg, Back  Bathing assist Assist Level: Set up   Set up : To obtain items  Upper Body Dressing/Undressing Upper body dressing   What is the patient wearing?: Pull over shirt/dress     Pull over shirt/dress - Perfomed by patient: Thread/unthread right sleeve, Thread/unthread left sleeve, Pull shirt over trunk, Put head through opening Pull over shirt/dress - Perfomed by helper: Put head through opening, Pull shirt over trunk        Upper body assist Assist Level: Set up   Set up : To obtain clothing/put away  Lower Body Dressing/Undressing Lower body dressing   What is the patient wearing?: AFO, Shoes     Pants- Performed by patient: Thread/unthread right pants leg, Pull pants up/down, Thread/unthread left pants leg Pants- Performed by helper: Thread/unthread left pants leg, Thread/unthread right pants leg Non-skid slipper socks- Performed by patient: Don/doff right sock, Don/doff left sock Non-skid slipper socks- Performed by  helper: Don/doff right sock, Don/doff left sock Socks - Performed by patient: Don/doff right sock, Don/doff left sock   Shoes - Performed by patient: Don/doff right shoe, Fasten right, Fasten left, Don/doff left shoe Shoes - Performed by helper: Fasten left, Don/doff left shoe   AFO - Performed by helper: Don/doff left AFO TED Hose - Performed by patient: Don/doff right TED hose, Don/doff left TED hose TED Hose - Performed by helper: Don/doff right TED  hose, Don/doff left TED hose  Lower body assist Assist for lower body dressing: Touching or steadying assistance (Pt > 75%)   Set up : To obtain clothing/put away  Toileting Toileting Toileting activity did not occur: No continent bowel/bladder event Toileting steps completed by patient: Adjust clothing prior to toileting, Performs perineal hygiene, Adjust clothing after toileting Toileting steps completed by helper: Adjust clothing prior to toileting, Performs perineal hygiene, Adjust clothing after toileting Toileting Assistive Devices: Grab bar or rail  Toileting assist Assist level: Supervision or verbal cues   Transfers Chair/bed transfer   Chair/bed transfer method: Stand pivot Chair/bed transfer assist level: Supervision or verbal cues Chair/bed transfer assistive device: Walker, Armrests Mechanical lift: Landscape architect Ambulation activity did not occur: Safety/medical concerns   Max distance: 40 ft Assist level: Supervision or verbal cues   Wheelchair   Type: Manual Max wheelchair distance: 70 ft Assist Level: Supervision or verbal cues  Cognition Comprehension Comprehension assist level: Understands complex 90% of the time/cues 10% of the time  Expression Expression assist level: Expresses complex ideas: With extra time/assistive device  Social Interaction Social Interaction assist level: Interacts appropriately 90% of the time - Needs monitoring or encouragement for participation or interaction.  Problem Solving Problem solving assist level: Solves basic 75 - 89% of the time/requires cueing 10 - 24% of the time  Memory Memory assist level: Recognizes or recalls 90% of the time/requires cueing < 10% of the time    Medical Problem List and Plan: 1. 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 Continue Dimethyl Fumarate for multiple sclerosis  Cont CIR 2. DVT Prophylaxis/Anticoagulation: SCDs.    Vascular study neg for DVT 3. Pain  Management: Hydrocodone as needed 4. Mood: Valium 5 mg every 12 hours as needed anxiety 5. Neuropsych: This patient iscapable of making decisions on herown behalf. 6. Skin/Wound Care: Routine skin checks 7. Fluids/Electrolytes/Nutrition: Routine I&O s 8.Neurogenic bladder.   Per Urology no further recs, will follow up as outpt  Bethanechol 10 started 3/2 9.MRSA UTI. Completed Vantin 200 mg every 12 hours. Continue contact precautions 10.Acute renal failure/bilateral hydronephrosis.  Follow-up urology services  Cr WNL 2/21 11. HTN  Relatively controlled 3/2  Monitor with increased mobility 12. ABLA  Hb 10.7 on 3/1  Cont to monitor 13. Headache  Excedrin ordered, pt states this is effective at home  Improved 14. Nausea  Zofran PRN 15. Yeast infection  Diflucan ordered 2/26 16. Constipation  Bowel reg increased on 2/26  Improving  LOS (Days) 10 A FACE TO FACE EVALUATION WAS PERFORMED  Jonna Dittrich Karis Juba 05/04/2016 9:17 AM

## 2016-05-04 NOTE — Progress Notes (Signed)
Social Work Patient ID: Crystal Park, female   DOB: 1960/07/12, 56 y.o.   MRN: [id number]  Spoke with Dan-PA who reports MD order to place a foley in pt prior to discharge. Have already ordered a HHRN for home along with HHPT and HHOT. [facility] via [employer] setting up services, probably through [facility], since pt has had them before. Self teaching and husband learning was not successful here. Will work toward discharge tomorrow.

## 2016-05-04 NOTE — Progress Notes (Signed)
At midnight on this shift, pt being awake and willing, I did education and demonstration of self-catheterization at the bedside using a handheld mirror, flashlight, and straight catheterization kit.  Talking pt through, pt first cleaned her hands, did perineal hygiene, cleaned her hands, donned clean gloves, and took the sterile, lubed straight cath from me.  Pt attempted twice with two different sterile caths, without success.  Pt observed with mirror as I, with Felicia CNA assisting me, cathed pt.  Pt observed, willingly participated, but clearly does not have confidence in her ability to cath herself at home.  Pt will practice tomorrow with goal to have some successful self-catheterization experiences prior to discharge.  Rayann Heman

## 2016-05-04 NOTE — Plan of Care (Signed)
Problem: RH Other (Specify) Goal: RH LTG Other (Specify)1 Outcome: Not Met (add Reason) Pt requires supervision and tactle/verbal cueing to perform tasks correctly. Pt was instructed to have husband present to assist with correct form.

## 2016-05-04 NOTE — Discharge Instructions (Signed)
Inpatient Rehab Discharge Instructions  C[place] Discharge date and time: No discharge date for patient encounter.   Activities/Precautions/ Functional Status: Activity: activity as tolerated Diet: regular diet Wound Care: none needed Functional status:  ___ No restrictions     ___ Walk up steps independently ___ 24/7 supervision/assistance   ___ Walk up steps with assistance ___ Intermittent supervision/assistance  ___ Bathe/dress independently ___ Walk with walker     _x__ Bathe/dress with assistance ___ Walk Independently    ___ Shower independently ___ Walk with assistance    ___ Shower with assistance ___ No alcohol     ___ Return to work/school ________  Special Instructions:    COMMUNITY REFERRALS UPON DISCHARGE:    Home Health:   PT, OT, RN    Agency: [facility] [phone]   Date of last service:05/05/2016  Medical Equipment/Items Ordered:HAS ALL EQUIPMENT FROM PAST ADMISSIONS   GENERAL COMMUNITY RESOURCES FOR PATIENT/FAMILY: Support Groups:[facility] [address] [place] [address] [phone]  My questions have been answered and I understand these instructions. I will adhere to these goals and the provided educational materials after my discharge from the hospital.  Patient/Caregiver Signature _______________________________ Date __________  Clinician Signature _______________________________________ Date __________  Please bring this form and your medication list with you to all your follow-up doctor's appointments.

## 2016-05-04 NOTE — Progress Notes (Signed)
RN educated pt on self cathing with pt attempting to self cath. RN assisted with max assist and pt was unable to successfully insert cath. Pt unable to find the urethra and unable to remain following the clean technique.  Pt has difficult anatomy and pt unable to self cath with multiple attempts. RN notified Harvel Ricks, PA with finding and new order to insert foley placed. Pt will be discharged saturday and follow up with urology via appointment.  Urine remains tea colored with blood tinge and blood clots present. Output remains low. RN will educate on proper management and foley care.

## 2016-05-04 NOTE — Plan of Care (Signed)
Problem: RH BLADDER ELIMINATION Goal: RH STG MANAGE BLADDER WITH EQUIPMENT WITH ASSISTANCE STG Manage Bladder With Equipment With total Assistance  Outcome: Progressing Pt will become proficient at home self-catheterization using clean technique by practicing while in the hospital with moderate assistance and use of hand held mirror.

## 2016-05-04 NOTE — Plan of Care (Signed)
Problem: RH BOWEL ELIMINATION Goal: RH STG MANAGE BOWEL WITH ASSISTANCE STG Manage Bowel with min Assistance.  Outcome: Progressing Pt encouraged to ambulate to bathroom w/rolling walker after meals, at regular times.

## 2016-05-04 NOTE — Progress Notes (Addendum)
Occupational Therapy Discharge Summary  Patient Details  Name: AVAMAE DEHAAN MRN: [id number] Date of Birth: [birth year]  Today's Date: 05/04/2016  Session 1 OT Individual Time: 0901-1000 OT Individual Time Calculation (min): 59 min   Session 2 OT Individual Time: 1601-0932 OT Individual Time Calculation (min): 56 min   Patient has met 9 of 9 long term goals due to improved activity tolerance, improved balance, postural control and ability to compensate for deficits.  Patient to discharge at overall Mod I/Supervision level.  Patient's care partner is independent to provide the necessary physical assistance at discharge.    Reasons goals not met: n/a  Recommendation:  Patient will benefit from ongoing skilled OT services in home health setting to continue to advance functional skills in the area of BADL.  Equipment: No equipment provided  Reasons for discharge: treatment goals met and discharge from hospital  Patient/family agrees with progress made and goals achieved: Yes   Skilled Therapeutic Interventions Session 1 With increased time and modified strategies, pt is able to complete bathing/dressing/self-care tasks at overall Mod I level. She requires supervision for shower transfers w/ RW and can ambulate to the bathroom, transfer on/off toilet and complete hygiene w/ RW and Mod I. Reviewed home modifications and energy conservation techniques within ADL tasks at home. Pt left semi-reclined in bed at end of session with needs met.   Session 2  1:1 OT session focused on home management, kitchen modifications, simple meal prep, and daily routine planning. Pt educated on proper walker placement during meal prep, use of walker bag, and technique for accessing top cabinets. Provided pt with 4 step written directions for collecting food items in kitchen, discussed memory strategies, then pt able to recall all 4 items without cues. Pt-therapist collaboration completed regarding kitchen  modificationsduring completion of simulated meal prep. Pt/ therapist collaboration to come up with daily routine/ daily schedule for pt to follow at dc. Included times for ADLs, leisure, home exercises, meals, and periods of rest during the day. Pt returned to room and left semi-reclined in bed with needs met.    OT Discharge Precautions/Restrictions  Precautions Precautions: Fall Restrictions Weight Bearing Restrictions: No Pain Pain Assessment Pain Assessment: 0-10 Pain Type: Acute pain Pain Location: Abdomen Pain Orientation: Lower Pain Descriptors / Indicators: Sore Pain Onset: Gradual Pain Intervention(s): Repositioned ADL ADL Grooming: Modified independent Where Assessed-Grooming: Standing at sink, Sitting at sink Upper Body Bathing: Modified independent Where Assessed-Upper Body Bathing: Shower Lower Body Bathing: Modified independent Where Assessed-Lower Body Bathing: Shower Upper Body Dressing: Modified independent (Device) Where Assessed-Upper Body Dressing: Edge of bed Lower Body Dressing: Modified independent Where Assessed-Lower Body Dressing: Edge of bed Toileting: Modified independent Where Assessed-Toileting: Glass blower/designer: Modified Programmer, applications Method: Counselling psychologist: Grab bars, Raised toilet seat Social research officer, government: Close supervision Social research officer, government Method: Heritage manager: Civil engineer, contracting with back ADL Comments: Please see functional navigator Vision/Perception  Vision- History Baseline Vision/History: Wears glasses Wears Glasses: At all times Patient Visual Report: No change from baseline Cognition Arousal/Alertness: Awake/alert Orientation Level: Oriented X4 Memory: Impaired Memory Impairment: Decreased short term memory Awareness: Appears intact Problem Solving: Appears intact Safety/Judgment: Appears intact Sensation Sensation Light Touch: Impaired Detail Light Touch  Impaired Details: Impaired RLE;Impaired LLE Hot/Cold: Appears Intact Proprioception: Appears Intact Coordination Fine Motor Movements are Fluid and Coordinated: Yes Mobility  Bed Mobility Supine to Sit: 6: Modified independent (Device/Increase time) Sit to Supine: 6: Modified independent (Device/Increase time)  Balance Static Sitting Balance  Static Sitting - Balance Support: No upper extremity supported Static Sitting - Level of Assistance: 6: Modified independent (Device/Increase time) Dynamic Sitting Balance Dynamic Sitting - Balance Support: During functional activity Dynamic Sitting - Level of Assistance: 6: Modified independent (Device/Increase time) Dynamic Sitting - Balance Activities: Lateral lean/weight shifting;Forward lean/weight shifting;Reaching for objects Static Standing Balance Static Standing - Balance Support: During functional activity Static Standing - Level of Assistance: 6: Modified independent (Device/Increase time) Dynamic Standing Balance Dynamic Standing - Balance Support: During functional activity Dynamic Standing - Level of Assistance: 6: Modified independent (Device/Increase time) Dynamic Standing - Balance Activities: Reaching for objects Extremity/Trunk Assessment RUE Assessment RUE Assessment: Within Functional Limits RUE Strength RUE Overall Strength: Within Functional Limits for tasks performed RUE Overall Strength Comments: grossly 4/5 LUE Assessment LUE Assessment: Within Functional Limits LUE Strength LUE Overall Strength: Within Functional Limits for tasks assessed LUE Overall Strength Comments: grossly 4+/5   See Function Navigator for Current Functional Status.  Daneen Schick Debby Clyne 05/04/2016, 3:50 PM

## 2016-05-04 NOTE — Plan of Care (Signed)
Problem: RH BLADDER ELIMINATION Goal: RH STG MANAGE BLADDER WITH EQUIPMENT WITH ASSISTANCE STG Manage Bladder With Equipment With total Assistance  Outcome: Completed/Met Date Met: 05/04/16 Foley insertion prior to d/c

## 2016-05-05 NOTE — Plan of Care (Signed)
Problem: RH BLADDER ELIMINATION Goal: RH STG MANAGE BLADDER WITH ASSISTANCE STG Manage Bladder With total Assistance  Outcome: Progressing Foley placed on 05/04/16

## 2016-05-05 NOTE — Discharge Summary (Addendum)
Crystal Park, BARMAN NO.:  [id number]  MEDICAL RECORD NO.:  [id number]  LOCATION:  [id number]                        FACILITY:  [facility]  PHYSICIAN:  Mariam Dollar, P.A.  DATE OF BIRTH:  Jun 21, 1960  DATE OF ADMISSION:  04/24/2016 DATE OF DISCHARGE:  05/05/2016                              DISCHARGE SUMMARY   DISCHARGE DIAGNOSES: 1. Tetraplegia secondary to multiple sclerosis exacerbation related to     urosepsis. 2. SCDs for deep venous thrombosis prophylaxis. 3. Pain management. 4. Anxiety. 5. Neurogenic bladder. 6. Methicillin-resistant Staphylococcus aureus urinary tract     infection. 7. Acute renal failure with bilateral hydronephrosis. 8. Hypertension. 9. Acute blood loss anemia. 10.Nausea. 11.Constipation, resolved. 12.Yeast infection.  HISTORY OF PRESENT ILLNESS:  A 56 year old right-handed female with history of multiple sclerosis, diagnosed in September 2016, followed by Dr. Epimenio Foot here for neurological.  She lives with spouse, independent with assistive device prior to admission.  Admitted on 04/20/2016, with complaints of progressive weakness, recent UTI, completing a course of Macrobid with urinary retention.  Workup CT of abdomen and pelvis revealed urosepsis, bilateral hydronephrosis, creatinine 2.12.  The patient was started on empiric Rocephin.  Urology consulted, diagnosed with neurogenic bladder, and recommended ongoing antibiotic care.  A Foley catheter tube initially placed with plan voiding trial.  The patient also had mild hematuria and monitored.  Latest urine culture 50,000 MRSA and placed on contact precautions.  Currently, she had been maintained on Vantin, initiated on 04/24/2016.  MRI of the brain showed stable appearance of demyelinating disease since August, 2017.  No acute intracranial abnormalities.  MRI of the thoracic spine showed progression of thoracic spinal cord demyelinating disease since August, 2017.  Renal function  continued to slowly improve.  Latest creatinine is 0.60.  Physical and occupational therapy ongoing.  The patient was admitted for comprehensive rehab program.  PAST MEDICAL HISTORY:  See discharge diagnoses.  SOCIAL HISTORY:  She lives with spouse, independent with a rolling walker prior to admission.  FUNCTIONAL STATUS:  Upon admission to rehab services was max assist squat, pivot, transfers; max assist, supine-to-sit; mod-to-max assist, activities of daily living.  PHYSICAL EXAMINATION:  VITAL SIGNS:  Blood pressure 148/91, pulse 100, temperature 98, and respirations 18. GENERAL:  This was an alert female, flat affect. HEENT:  EOMs intact. NECK:  Supple.  Nontender.  No JVD. CARDIAC:  Regular rhythm.  No murmur. ABDOMEN:  Soft and nontender.  Good bowel sounds. LUNGS:  Clear to auscultation without wheeze. EXTREMITIES:  Right upper extremity 3-/5 proximal and distal, right lower extremity trace to 1/5 proximal to distal, left upper extremity 3- to 4/5, left lower extremity 1+ to 2/5.  REHABILITATION HOSPITAL COURSE:  The patient was admitted to inpatient rehab services with therapies initiated on a 3-hour daily basis, consisting of physical therapy, occupational therapy, and rehabilitation nursing.  The following issues were addressed during the patient's rehabilitation stay.  Pertaining to Ms. Mccolgan's MS exacerbation related to urosepsis, remained stable.  She had completed antibiotic therapy.  Latest urine culture showed no growth.  She had remained on contact precautions for MRSA in her urine.  She was afebrile.  She would follow up with outpatient Urology Services.  SCDs  for DVT prophylaxis. Her renal function had steadily improved with latest creatinine of 0.56. Blood pressures with no orthostasis.  Acute on chronic anemia 9.5 and monitored.  Bouts of constipation resolved with laxative assistance.  In regard to her acute renal failure and bilateral hydronephrosis as  well as neurogenic bladder, she would follow up outpatient again with Urology Services.  A Foley catheter tube was placed as patient was not able to perform self I&O catheterizations or family, full education had been provided.  The patient received weekly collaborative interdisciplinary team conferences to discuss estimated length of stay, family teaching, any barriers to discharge.  She was ambulating 70-80 feet supervision with assisted device, required supervision to stand up from edge of bed, self-propelled her wheelchair, performed the DGI testing and scored 12 out 25 indicating risk of fall, this was discussed at length with the patient and husband.  She required supervision with ADLs and hygiene, performed stairs with supervision and extended rest breaks working with energy conservation techniques.  Full family teaching was completed and plan discharge to home.  DISCHARGE MEDICATIONS: 1. Dimethyl-fumarate 240 mg p.o. b.i.d. 2. MiraLAX daily. 3. Senokot-S 1 tablet at bedtime. 4. Valium 5 mg p.o. every 12 hours as needed. 5. Hydrocodone 1 tablet every 6 hours as needed. 6. Zofran as needed DIET:  Regular.  FOLLOWUP:  She would follow up with Dr. Maryla Morrow at the outpatient rehab service office as advised; Dr. Despina Arias of Neurology Services, Dr. Jerilee Field of Urology Services, and Dr. Duane Lope, Medical Management.       Mariam Dollar, P.A.     DA/MEDQ  D:  05/04/2016  T:  05/05/2016  Job:  [id number]  cc:   Magnus Sinning) Tenny Craw, M.D. Despina Arias, MD Jerilee Field, MD Maryla Morrow, MD

## 2016-05-05 NOTE — Progress Notes (Signed)
[facility]     PROGRESS NOTE  Subjective/Complaints:  Pt seen laying in bed this AM.  She slept well overnight and is ready for d/c.  ROS:  Denies CP, SOB, N/V/D.  Objective: Vital Signs: Blood pressure 128/85, pulse 83, temperature 98.9 F (37.2 C), temperature source Oral, resp. rate 18, height 5\' 3"  (1.6 m), weight 57.4 kg (126 lb 9.6 oz), last menstrual period 10/25/2015, SpO2 98 %. No results found.  Recent Labs  05/03/16 1240  WBC 9.3  HGB 10.7*  HCT 33.6*  PLT 602*   No results for input(s): NA, K, CL, GLUCOSE, BUN, CREATININE, CALCIUM in the last 72 hours.  Invalid input(s): CO CBG (last 3)  No results for input(s): GLUCAP in the last 72 hours.  Wt Readings from Last 3 Encounters:  04/24/16 57.4 kg (126 lb 9.6 oz)  04/21/16 54.1 kg (119 lb 4.3 oz)  02/23/16 59.2 kg (130 lb 8 oz)    Physical Exam:  BP 128/85 (BP Location: Left Arm)   Pulse 83   Temp 98.9 F (37.2 C) (Oral)   Resp 18   Ht 5\' 3"  (1.6 m)   Wt 57.4 kg (126 lb 9.6 oz)   LMP 10/25/2015   SpO2 98%   BMI 22.43 kg/m  Constitutional: NAD. Vital signs reviewed. Well-developed.  HENT: Normocephalicand atraumatic.  Eyes: EOMI. No discharge.  Cardiovascular: RRR. No JVD. Respiratory: Unlabored. Clear.  GI: Soft. Bowel sounds are normal.  Musculoskeletal: She exhibits no edema, no tenderness.  Neurological: Alert and oriented x3 with some confusion. RUE 4/5prox to distal.  RLE 3/5 HF, 4/5 KE, 4/5 distally (unchanged) LUE 4/5 proximal to distal.  LLE 4-/5 HF, 4/5 KE, 4/5 distally  Skin. Warm and dry Psych: Flat  Assessment/Plan: 1. Functional deficits secondary to MS exacerbation which require 3+ hours per day of interdisciplinary therapy in a comprehensive inpatient rehab setting. Physiatrist is providing close team supervision and 24 hour management of active medical problems listed below. Physiatrist and rehab team continue to assess barriers to  discharge/monitor patient progress toward functional and medical goals.  Function:  Bathing Bathing position Bathing activity did not occur: Refused Position: Shower  Bathing parts Body parts bathed by patient: Right arm, Chest, Left arm, Abdomen, Front perineal area, Right upper leg, Buttocks, Left lower leg, Left upper leg, Right lower leg Body parts bathed by helper: Right upper leg, Right lower leg, Left upper leg, Left lower leg, Back  Bathing assist Assist Level: Set up   Set up : To obtain items  Upper Body Dressing/Undressing Upper body dressing   What is the patient wearing?: Pull over shirt/dress     Pull over shirt/dress - Perfomed by patient: Thread/unthread right sleeve, Thread/unthread left sleeve, Put head through opening, Pull shirt over trunk Pull over shirt/dress - Perfomed by helper: Put head through opening, Pull shirt over trunk        Upper body assist Assist Level: No help, No cues   Set up : To obtain clothing/put away  Lower Body Dressing/Undressing Lower body dressing   What is the patient wearing?: [employer], Socks, Shoes     Pants- Performed by patient: Thread/unthread right pants leg, Thread/unthread left pants leg, Pull pants up/down Pants- Performed by helper: Thread/unthread left pants leg, Thread/unthread right pants leg Non-skid slipper socks- Performed by patient: Don/doff right sock, Don/doff left sock Non-skid slipper socks- Performed by helper: Don/doff right sock, Don/doff left sock Socks - Performed by patient: Don/doff  right sock, Don/doff left sock   Shoes - Performed by patient: Don/doff right shoe, Don/doff left shoe Shoes - Performed by helper: Fasten left, Don/doff left shoe   AFO - Performed by helper: Don/doff left AFO TED Hose - Performed by patient: Don/doff left TED hose, Don/doff right TED hose TED Hose - Performed by helper: Don/doff right TED hose, Don/doff left TED hose  Lower body assist Assist for lower body dressing:  More than reasonable time   Set up : To obtain clothing/put away  Toileting Toileting Toileting activity did not occur: No continent bowel/bladder event Toileting steps completed by patient: Adjust clothing prior to toileting, Performs perineal hygiene, Adjust clothing after toileting Toileting steps completed by helper: Adjust clothing prior to toileting, Performs perineal hygiene, Adjust clothing after toileting Toileting Assistive Devices: Grab bar or rail  Toileting assist Assist level: More than reasonable time   Transfers Chair/bed transfer   Chair/bed transfer method: Stand pivot Chair/bed transfer assist level: No Help, no cues, assistive device, takes more than a reasonable amount of time Chair/bed transfer assistive device: Armrests, Biochemist, clinical lift: Landscape architect Ambulation activity did not occur: Safety/medical concerns   Max distance: 170 ft Assist level: No help, No cues, assistive device, takes more than a reasonable amount of time   Wheelchair   Type: Manual Max wheelchair distance: 70 ft Assist Level: Supervision or verbal cues  Cognition Comprehension Comprehension assist level: Follows complex conversation/direction with no assist  Expression Expression assist level: Expresses basic needs/ideas: With no assist  Social Interaction Social Interaction assist level: Interacts appropriately with others with medication or extra time (anti-anxiety, antidepressant).  Problem Solving Problem solving assist level: Solves complex 90% of the time/cues < 10% of the time  Memory Memory assist level: Recognizes or recalls 90% of the time/requires cueing < 10% of the time    Medical Problem List and Plan: 1. 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 Continue Dimethyl Fumarate for multiple sclerosis  Cont CIR 2. DVT Prophylaxis/Anticoagulation: SCDs.    Vascular study neg for DVT 3. Pain Management: Hydrocodone as needed 4.  Mood: Valium 5 mg every 12 hours as needed anxiety 5. Neuropsych: This patient iscapable of making decisions on herown behalf. 6. Skin/Wound Care: Routine skin checks 7. Fluids/Electrolytes/Nutrition: Routine I&O s 8.Neurogenic bladder.   Per Urology no further recs, will follow up as outpt  Bethanechol 10 started 3/2 9.MRSA UTI. Completed Vantin 200 mg every 12 hours. Continue contact precautions 10.Acute renal failure/bilateral hydronephrosis.  Follow-up urology services  Cr WNL 2/21 11. HTN  Controlled 3/3  Monitor with increased mobility 12. ABLA  Hb 10.7 on 3/1  Cont to monitor 13. Headache  Excedrin ordered, pt states this is effective at home  Improved 14. Nausea  Zofran PRN 15. Yeast infection  Diflucan ordered 2/26 16. Constipation  Bowel reg increased on 2/26  Improving  LOS (Days) 11 A FACE TO FACE EVALUATION WAS PERFORMED  Nesta Scaturro Karis Juba 05/05/2016 7:28 PM

## 2016-05-05 NOTE — Progress Notes (Signed)
Pt D/Ces to home accompanied by husband.  Verbalizes understanding of discharge instructions given by PAC.  VSS. Pt and husband educated re: foley care, leg bag, S/S infection, I/O monitoring.  Returned demo of leg bag placement with proper technique.

## 2016-05-07 ENCOUNTER — Encounter: Payer: Self-pay | Admitting: Physical Medicine & Rehabilitation

## 2016-05-08 ENCOUNTER — Telehealth: Payer: Self-pay | Admitting: *Deleted

## 2016-05-08 NOTE — Telephone Encounter (Signed)
Transitional Care call- I spoke with Crystal Park and her husband    1. Are you/is patient experiencing any problems since coming home? Are there any questions regarding any aspect of care? Really Nauseas and dizzy at times. Zofran helps with the nausea. 2. Are there any questions regarding medications administration/dosing? Are meds being taken as prescribed? Patient should review meds with caller to confirm They have medications 3. Have there been any falls? NO 4. Has Home Health been to the house and/or have they contacted you? If not, have you tried to contact them? Can we help you contact them? They are not having HH RN come out since she is not self cathing. 5. Are bowels and bladder emptying properly? Are there any unexpected incontinence issues? If applicable, is patient following bowel/bladder programs?No problems. Has return appt with Urologist for bladder and foley care 6. Any fevers, problems with breathing, unexpected pain? No abd pain is decreasing 7. Are there any skin problems or new areas of breakdown? No issues 8. Has the patient/family member arranged specialty MD follow up (ie cardiology/neurology/renal/surgical/etc)?  Can we help arrange? Appt given to return to see Dr Allena Katz 9. Does the patient need any other services or support that we can help arrange? No 10. Are caregivers following through as expected in assisting the patient? Yes 11. Has the patient quit smoking, drinking alcohol, or using drugs as recommended? N/A  Appointment time Wednesday 05/16/16 @9 :00 arrive by 8:45 to see Dr Allena Katz [address]

## 2016-05-16 ENCOUNTER — Encounter
Payer: Managed Care, Other (non HMO) | Attending: Physical Medicine & Rehabilitation | Admitting: Physical Medicine & Rehabilitation

## 2016-05-16 ENCOUNTER — Encounter: Payer: Self-pay | Admitting: Physical Medicine & Rehabilitation

## 2016-05-16 VITALS — BP 122/85 | HR 79

## 2016-05-16 DIAGNOSIS — M792 Neuralgia and neuritis, unspecified: Secondary | ICD-10-CM | POA: Diagnosis not present

## 2016-05-16 DIAGNOSIS — R51 Headache: Secondary | ICD-10-CM | POA: Insufficient documentation

## 2016-05-16 DIAGNOSIS — R410 Disorientation, unspecified: Secondary | ICD-10-CM | POA: Insufficient documentation

## 2016-05-16 DIAGNOSIS — K5901 Slow transit constipation: Secondary | ICD-10-CM

## 2016-05-16 DIAGNOSIS — G35 Multiple sclerosis: Secondary | ICD-10-CM | POA: Insufficient documentation

## 2016-05-16 DIAGNOSIS — Z5189 Encounter for other specified aftercare: Secondary | ICD-10-CM | POA: Diagnosis present

## 2016-05-16 DIAGNOSIS — R112 Nausea with vomiting, unspecified: Secondary | ICD-10-CM | POA: Diagnosis not present

## 2016-05-16 DIAGNOSIS — K5900 Constipation, unspecified: Secondary | ICD-10-CM | POA: Insufficient documentation

## 2016-05-16 DIAGNOSIS — R339 Retention of urine, unspecified: Secondary | ICD-10-CM

## 2016-05-16 DIAGNOSIS — N319 Neuromuscular dysfunction of bladder, unspecified: Secondary | ICD-10-CM

## 2016-05-16 DIAGNOSIS — R4589 Other symptoms and signs involving emotional state: Secondary | ICD-10-CM

## 2016-05-16 DIAGNOSIS — R42 Dizziness and giddiness: Secondary | ICD-10-CM | POA: Diagnosis not present

## 2016-05-16 DIAGNOSIS — F419 Anxiety disorder, unspecified: Secondary | ICD-10-CM | POA: Insufficient documentation

## 2016-05-16 DIAGNOSIS — F418 Other specified anxiety disorders: Secondary | ICD-10-CM

## 2016-05-16 DIAGNOSIS — G35D Multiple sclerosis, unspecified: Secondary | ICD-10-CM

## 2016-05-16 DIAGNOSIS — G8929 Other chronic pain: Secondary | ICD-10-CM

## 2016-05-16 MED ORDER — GABAPENTIN 100 MG PO CAPS: 200.0000 mg | ORAL_CAPSULE | Freq: Three times a day (TID) | ORAL | 1 refills | Status: DC | PRN

## 2016-05-16 NOTE — Progress Notes (Signed)
Subjective:    Patient ID: Crystal Park, female    DOB: [birth year], 56 y.o.   MRN: [id number]  HPI 56 year old right-handhanded female with history of multiple sclerosis presents for transitional care management after receiving CIR for MS exacerbation.    DATE OF ADMISSION:  04/24/2016 DATE OF DISCHARGE:  05/05/2016  Husband present, who provides the history. At discharge, she was encouraged to follow up with Neurology, which she has an appointment with.  She has an appointment with Urology this week. She saw PCP yesterday.  She continues to take Valium.  Her BP is controlled.  Her headaches at intermittent, but controlled for the most part.  Her nausea has resolved. Her bowel reg is improving.  DME: None required Mobility: Walker at all times Therapies: They have not been contacted by [facility] agency for therapies, but PCP ordered  Pain Inventory Average Pain 7 Pain Right Now 0 My pain is stabbing  In the last 24 hours, has pain interfered with the following? General activity . Relation with others . Enjoyment of life . What TIME of day is your pain at its worst? night Sleep (in general) Fair  Pain is worse with: unsure Pain improves with: medication Relief from Meds: 9  Mobility use a walker ability to climb steps?  yes do you drive?  no  Function disabled: date disabled . I need assistance with the following:  dressing, bathing, meal prep, household duties and shopping  Neuro/Psych bladder control problems weakness tingling trouble walking dizziness confusion anxiety  Prior Studies Any changes since last visit?  no  Physicians involved in your care Any changes since last visit?  no   No family history on file. Social History   Social History  . Marital status: Married    Spouse name: N/A  . Number of children: N/A  . Years of education: N/A   Social History Main Topics  . Smoking status: Never Smoker  . Smokeless tobacco: Never Used  . Alcohol use  No  . Drug use: No  . Sexual activity: Not Currently   Other Topics Concern  . None   Social History Narrative  . None   Past Surgical History:  Procedure Laterality Date  . OOPHORECTOMY Left    2013   Past Medical History:  Diagnosis Date  . Benign paroxysmal positional vertigo 10/12/2014  . MS (multiple sclerosis) (HCC)    diagnosed 9/16   BP 122/85   Pulse 79   LMP 10/25/2015   SpO2 99%   Opioid Risk Score:   Fall Risk Score:  `1  Depression screen PHQ 2/9  Depression screen PHQ 2/9 05/16/2016  Decreased Interest 2  Down, Depressed, Hopeless 0  PHQ - 2 Score 2  Altered sleeping 1  Tired, decreased energy 3  Change in appetite 2  Feeling bad or failure about yourself  0  Trouble concentrating 3  Moving slowly or fidgety/restless 0  Suicidal thoughts 0  PHQ-9 Score 11  Difficult doing work/chores Very difficult    Review of Systems  HENT: Negative.   Eyes: Negative.   Respiratory: Negative.   Cardiovascular: Negative.   Gastrointestinal: Positive for abdominal pain.  Endocrine: Negative.   Genitourinary: Negative.   Musculoskeletal: Positive for gait problem and joint swelling.  Skin: Negative.   Allergic/Immunologic: Negative.   Neurological: Positive for dizziness and weakness.  Hematological: Negative.   Psychiatric/Behavioral: Negative.   All other systems reviewed and are negative.      Objective:  Physical Exam Constitutional: NAD. Vital signs reviewed. Well-developed.  HENT: Normocephalic and atraumatic.  Eyes: EOMI. No discharge.  Cardiovascular: RRR. No JVD. Respiratory: Unlabored. Clear.  GI: Soft. Bowel sounds are normal.  GU: Foley at present Musculoskeletal: She exhibits no edema, no tenderness.  Neurological: Alert and oriented x3. RUE 4+/5 prox to distal.  RLE 3+/5 HF, 4/5 KE, 4+/5 distally  LUE 4+/5 proximal to distal.  LLE 4+/5 HF, 4+/5 KE, 4+/5 distally  Skin. Warm and dry Psych: Flat.     Assessment & Plan:    56 year old right-handed female with history of multiple sclerosis presents for transitional care management after receiving CIR for MS exacerbation.    1.  MS exacerbation related to Urosepsis.   Cont meds  Cont follow up with Neurology  Cont therapies  2.  Pain  Encouraged weaning of hydrocodone and choosing alternative meds  Will start Gabapentin 200 TID PRN  3. Mood/Dizziness  Cont Valium 5 mg every 12 hours as needed   4. Neurogenic bladder.   Follow up with Urology  Cath at present  5. Headaches  Cont meds PRN  6. Constipation  Cont PRN meds  Improving  Meds reviewed Referrals reviewed All questions answered

## 2016-05-21 ENCOUNTER — Encounter: Payer: Self-pay | Admitting: Rehabilitation

## 2016-05-21 ENCOUNTER — Ambulatory Visit: Payer: Managed Care, Other (non HMO) | Attending: Family Medicine | Admitting: Rehabilitation

## 2016-05-21 ENCOUNTER — Ambulatory Visit: Payer: Managed Care, Other (non HMO) | Admitting: Occupational Therapy

## 2016-05-21 DIAGNOSIS — R41840 Attention and concentration deficit: Secondary | ICD-10-CM | POA: Insufficient documentation

## 2016-05-21 DIAGNOSIS — M6281 Muscle weakness (generalized): Secondary | ICD-10-CM | POA: Diagnosis not present

## 2016-05-21 DIAGNOSIS — R2681 Unsteadiness on feet: Secondary | ICD-10-CM

## 2016-05-21 DIAGNOSIS — M25612 Stiffness of left shoulder, not elsewhere classified: Secondary | ICD-10-CM | POA: Insufficient documentation

## 2016-05-21 DIAGNOSIS — R2689 Other abnormalities of gait and mobility: Secondary | ICD-10-CM | POA: Insufficient documentation

## 2016-05-21 DIAGNOSIS — R278 Other lack of coordination: Secondary | ICD-10-CM

## 2016-05-21 DIAGNOSIS — M25611 Stiffness of right shoulder, not elsewhere classified: Secondary | ICD-10-CM

## 2016-05-21 DIAGNOSIS — R42 Dizziness and giddiness: Secondary | ICD-10-CM | POA: Insufficient documentation

## 2016-05-21 NOTE — Therapy (Signed)
[facility] [address] [place], [place], [address] Phone: [phone]   Fax:  [phone]  Occupational Therapy Evaluation  Patient Details  Name: Crystal Park MRN: [id number] Date of Birth: Feb 28, 1961 Referring Provider: Dr. [name]  Encounter Date: 05/21/2016      OT End of Session - 05/21/16 1912    Visit Number 1   Number of Visits 17   Date for OT Re-Evaluation 07/20/16   Authorization Type [employer], 60 visit limit combined OT/ST, no auth needed   OT Start Time [phone]   OT Stop Time 0932   OT Time Calculation (min) 41 min   Activity Tolerance Patient tolerated treatment well   Behavior During Therapy [facility] for tasks assessed/performed      Past Medical History:  Diagnosis Date  . Benign paroxysmal positional vertigo 10/12/2014  . MS (multiple sclerosis) (HCC)    diagnosed 9/16    Past Surgical History:  Procedure Laterality Date  . OOPHORECTOMY Left    2013    There were no vitals filed for this visit.      Subjective Assessment - 05/21/16 0902    Subjective  Pt reports decline in functioning after hospitalizations in 10/2015 and 04/2016 (UTI/kidney infection).  Pt reports more weakness on R side   Patient is accompained by: Family member  husband   Pertinent History MS diagnosis 9/16 (but some symptoms in 2010)   Limitations urinary catheter, fall risk   Patient Stated Goals bathe herself (including transfer), simple cold/microwave meal prep, fold laundry, feed dog, dress mod I (including getting clothes out)   Currently in Pain? No/denies           [facility] OT Assessment - 05/21/16 0001      Assessment   Diagnosis MS   Referring Provider Dr. [name]   Onset Date --  recent hospitalization 04/20/16-04/28/16   Prior Therapy PT after diagnosis     Precautions   Precautions Fall   Required Braces or Orthoses --  vertigo     Balance Screen   Has the patient fallen in the past 6  months Yes   How many times? 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, broadcasting/film/video   Leisure would like to be able to feed dog again     ADL   Eating/Feeding --  difficulty, set-up for cutting food, opening items   Grooming Modified independent   Upper Body Bathing Supervision/safety  min A to dry off   Lower Body Bathing Supervision/safety  min A for drying off   Upper Body Dressing Set up   Lower Body Dressing --  set-up, needs assist due to catheter   Toilet Tranfer Modified independent  raised   Toileting - Clothing Manipulation Modified independent   Toileting -  Hygiene Modified Independent  catheter currently (since hospitalization)   Tub/Shower Transfer Supervision/safety  to min A   Psychologist, educational Grab bars;Walk in shower  built-in seat     IADL   Prior Level of Function Light Housekeeping has cleaning service, did fold laundry with set-up, but not currently   Prior Level of Function Meal Prep performed  simple prep (breakfast/lunch)   Meal Prep --  is not performing   Prior Level of Function Community Mobility was not driving prior to Aug   Community Mobility Relies on family or friends for transportation   Prior Level of Function Financial Management shared previously   Landscape architect --  husband performs now     Mobility   Mobility Status History of falls  mod I with RW   Mobility Status Comments was using small based quad cane previously     Written Expression   Dominant Hand Right   Handwriting --  reports difficulty     Vision - History   Additional Comments Pt denies diplopia currently, but has had in the past  pt denies difficulty with clarity      Vision Assessment   Vision Assessment Vision not tested  Pt reports vertigo with head/eye movements   Comment Pt reports difficulty with dizziness when looking back, reading, or watching tv     Cognition   Overall Cognitive Status Impaired/Different from baseline   Area of Impairment Attention;Memory   Attention Comments impaired per Epic notes   Memory Decreased short-term memory     Sensation   Light Touch --  denies changes in UEs   Additional Comments Pt reports bilateral tingling in toes BLEs     Coordination   9 Hole Peg Test Right;Left   Right 9 Hole Peg Test 34.85sec   Left 9 Hole Peg Test 33.56sec     ROM / Strength   AROM / PROM / Strength AROM;Strength     AROM   Overall AROM  Deficits   Overall AROM Comments grossly 75% shoulder ROM bilaterally     Strength   Overall Strength Deficits   Overall Strength Comments Proximal UE strength:  RUE shoulder grossly 3 to 3+/5, biceps/triceps grossly 4/5.  L shoulder strength grossly 3+/5, biceps/triceps 4/5.  Pt also demo decr core/trunk stability and posture.     Hand Function   Right Hand Grip (lbs) 30   Left Hand Grip (lbs) 45                         OT Education - 05/21/16 1909    Education provided Yes   Education Details OT eval findings, POC/goals   Person(s) Educated Patient;Spouse   Methods Explanation   Comprehension Verbalized understanding          OT Short Term Goals - 05/21/16 1930      OT SHORT TERM GOAL #1   Title Pt will be independent with initial HEP.--check STGs 06/20/16   Time 4   Period Weeks   Status New     OT SHORT TERM GOAL #2   Title Pt will be able to fold laundry in sitting (with set-up).   Time 4   Period Weeks   Status New     OT SHORT TERM GOAL #3   Title Pt will be able to retrieve clothing prior to dressing mod I.   Time 4   Period Weeks   Status New     OT SHORT TERM GOAL #4   Title Pt will improve bilateral hand coordination for ADLs as shown  by improving time on 9-hole peg test by at least 4 sec bilaterally.   Baseline R-34.85sec, L-33.56sec   Time 4   Period Weeks   Status New     OT SHORT TERM GOAL #5   Title Pt will improve  R hand strength by at least 5lbs for incr ease with opening packages/containers.   Baseline R-30lbs   Time 4   Period Weeks   Status New     Additional Short Term Goals   Additional Short Term Goals Yes     OT SHORT TERM GOAL #6   Title Pt will verbalize understanding of energy conservation strategies and AE for incr safety and independence with ADLs/IADLs.   Time 4   Period Weeks   Status New           OT Long Term Goals - 05/21/16 1937      OT LONG TERM GOAL #1   Title Pt will be independent with updated HEP.--check LTGs 07/20/16   Time 8   Period Weeks   Status New     OT LONG TERM GOAL #2   Title Pt will perform bathing mod I including shower transfer.   Baseline supervision-min A   Time 8   Period Weeks   Status New     OT LONG TERM GOAL #3   Title Pt will perform simple cold meal/snack prep and warm items in microwave mod I.   Time 8   Period Weeks   Status New     OT LONG TERM GOAL #4   Title Pt will improve R hand strength by at least 10lbs for incr ease with opening packages/containers.   Baseline 30lbs   Time 8   Period Weeks   Status New     OT LONG TERM GOAL #5   Title Pt will be able to feed dog mod I.   Time 8   Period Weeks   Status New     Long Term Additional Goals   Additional Long Term Goals Yes     OT LONG TERM GOAL #6   Title Pt will verbalize understanding of memory compensation strategies for ADLs prn.   Time 8   Period Weeks   Status New               Plan - 05/21/16 1914    Clinical Impression Statement Pt is a 56 y.o. female with diagnosis of MS.   Pt reports decline in function/ADLs since hospitalizations 10/2015 and 04/2016 (both hospitalizations due to UTI/sepsis).  Pt presents with decr strength/activity tolerance, decr  ADL/IADL performance, decr balance/functional mobility, decr coordination, decr shoulder ROM, and cognitive deficits.  Pt would benefit from occupational therapy to address these deficits in order to incr independence with ADLs/IADLs, establish HEP, and improve UE functional use.   Rehab Potential Good   OT Frequency 2x / week   OT Duration 8 weeks  +eval   OT Treatment/Interventions Self-care/ADL training;Cryotherapy;Parrafin;Therapeutic exercise;DME and/or AE instruction;Building services engineer;Therapeutic activities;Patient/family education;Balance training;Cognitive remediation/compensation;Manual Therapy;Splinting;Neuromuscular education;Fluidtherapy;Ultrasound;Electrical Stimulation;Moist Heat;Energy conservation;Contrast Bath;Passive range of motion;Therapeutic exercises;Visual/perceptual remediation/compensation   Plan initiate HEP (cane HEP, putty for R hand, coordination HEP)   Recommended Other Services currently receiving PT   Consulted and Agree with Plan of Care Patient;Family member/caregiver   Family Member Consulted husband      Patient will benefit from skilled therapeutic intervention in order to improve the following deficits and impairments:  Decreased balance, Decreased endurance, Decreased mobility, Decreased range of motion, Decreased knowledge of use of DME, Decreased strength, Impaired UE functional use, Impaired flexibility, Decreased activity tolerance, Decreased coordination, Decreased cognition  Visit Diagnosis: Muscle weakness (generalized)  Unsteadiness on feet  Other abnormalities of gait and mobility  Attention and concentration deficit  Other lack  of coordination  Stiffness of right shoulder, not elsewhere classified  Stiffness of left shoulder, not elsewhere classified    Problem List Patient Active Problem List   Diagnosis Date Noted  . Hematuria   . Urinary retention   . Slow transit constipation   . Yeast infection   . Acute lower UTI    . Other complicated headache syndrome   . Acute blood loss anemia   . Reactive hypertension   . Multiple sclerosis exacerbation (HCC) 04/24/2016  . Neurogenic bladder 04/24/2016  . Weakness of both arms   . Weakness of both legs   . Sepsis (HCC) 04/21/2016  . Bilateral hydronephrosis 04/21/2016  . ARF (acute renal failure) (HCC) 04/21/2016  . Sepsis secondary to UTI (HCC) 04/20/2016  . Attention deficit disorder 02/23/2016  . Gait disturbance 11/24/2015  . Other fatigue 11/24/2015  . Urinary urgency 11/24/2015  . Dehydration   . UTI (lower urinary tract infection) 10/29/2015  . Leukocytosis 10/29/2015  . Nausea and vomiting 10/29/2015  . MS (multiple sclerosis) (HCC) 10/26/2014    [facility] 05/21/2016, 7:44 PM   [facility] [address] [place], [place], [address] Phone: [phone]   Fax:  [phone]  Name: Crystal Park MRN: [id number] Date of Birth: 16-Jan-1961   Willa Frater, OTR/L [facility] [address] [place], [place]  [address] [phone] phone [phone] 05/21/16 7:44 PM

## 2016-05-21 NOTE — Therapy (Signed)
[facility] [address] [place], [place], [address] Phone: [phone]   Fax:  [phone]  Physical Therapy Evaluation  Patient Details  Name: Crystal Park MRN: [id number] Date of Birth: 09/08/1960 Referring Provider: Daisy Floro, MD  Encounter Date: 05/21/2016      PT End of Session - 05/21/16 1117    Visit Number 1   Number of Visits 17   Date for PT Re-Evaluation 07/20/16   Authorization Type [employer] Nira Conn Misty Stanley regarding benefits)   PT Start Time [phone]   PT Stop Time 1017   PT Time Calculation (min) 46 min   Activity Tolerance Patient limited by fatigue   Behavior During Therapy [facility] for tasks assessed/performed      Past Medical History:  Diagnosis Date  . Benign paroxysmal positional vertigo 10/12/2014  . MS (multiple sclerosis) (HCC)    diagnosed 9/16    Past Surgical History:  Procedure Laterality Date  . OOPHORECTOMY Left    2013    There were no vitals filed for this visit.       Subjective Assessment - 05/21/16 0935    Subjective "I had a really bad bladder infection and it spread into my kidneys.  I had MRSA in my blood.  I had to go to the hospital.  I got very weak and off balance."    Patient is accompained by: Family member  Crystal Park, husband   Limitations House hold activities;Walking   Patient Stated Goals "To regain strength and balance."    Currently in Pain? Yes   Pain Score 5    Pain Location Arm   Pain Orientation Right   Pain Descriptors / Indicators Sore   Pain Type Acute pain   Pain Onset Today   Pain Frequency Intermittent   Aggravating Factors  with increaesed movement   Pain Relieving Factors rest            [facility] PT Assessment - 05/21/16 0940      Assessment   Medical Diagnosis MS exacerbation   Referring Provider Daisy Floro, MD   Onset Date/Surgical Date 04/20/16   Prior Therapy acute, IP rehab      Precautions   Precautions Fall   Required Braces or Orthoses --  has L foot up brace     Restrictions   Weight Bearing Restrictions No     Balance Screen   Has the patient fallen in the past 6 months Yes   How many times? 1   Has the patient had a decrease in activity level because of a fear of falling?  Yes   Is the patient reluctant to leave their home because of a fear of falling?  Yes     Home Environment   Living Environment Private residence   Living Arrangements Spouse/significant other   Available Help at Discharge Available 24 hours/day   Type of Home House   Home Access Stairs to enter   Entrance Stairs-Number of Steps 3   Entrance Stairs-Rails Right;Left;Can reach both   Home Layout Two level;Able to live on main level with bedroom/bathroom   Home Equipment Walker - 2 wheels;Cane - single point;Tub bench;Grab bars - tub/shower  has quad tip on [facility]     Prior Function   Level of Independence Independent with basic ADLs;Independent with household mobility with device  stairs at S level   Vocation On disability   Leisure Would like to return to getting outside, would like to walk  dog (medium sized dog)     Cognition   Overall Cognitive Status Within Functional Limits for tasks assessed  some memory issues   Memory Impaired  short term memory     Sensation   Light Touch Impaired Detail   Light Touch Impaired Details Impaired RLE;Impaired LLE  numbness in B feet, this is her baseline   Hot/Cold Appears Intact     Coordination   Gross Motor Movements are Fluid and Coordinated No   Fine Motor Movements are Fluid and Coordinated No   Heel Shin Test decreased due to weakness in RLE     ROM / Strength   AROM / PROM / Strength Strength     Strength   Overall Strength Deficits   Overall Strength Comments R hip flex 3-/5, L hip flex 3+5, R knee ext 3+/5, L knee ext 4/5, R knee flex 2/5, L knee flex 3+/5, R ankle DF 3+/5, L ankle DF 3+/5, R ankle PF 3/5, L ankle PF 4/5     Bed Mobility   Bed  Mobility --  reports difficulty scooting back onto bed due to height     Transfers   Transfers Sit to Stand;Stand to Sit   Sit to Stand 6: Modified independent (Device/Increase time)   Five time sit to stand comments  25.31 w/ single UE support   Stand to Sit 6: Modified independent (Device/Increase time)     Ambulation/Gait   Ambulation/Gait Yes   Ambulation/Gait Assistance 5: Supervision;6: Modified independent (Device/Increase time)   Ambulation Distance (Feet) 125 Feet   Assistive device Rolling walker   Gait Pattern Step-through pattern;Decreased arm swing - left;Decreased arm swing - right;Decreased stride length;Trunk flexed;Narrow base of support  had on L foot up brace   Ambulation Surface Level;Indoor   Gait velocity 1.15 ft/sec with RW     Standardized Balance Assessment   Standardized Balance Assessment Timed Up and Go Test     Timed Up and Go Test   TUG Normal TUG   Normal TUG (seconds) 36.5  with RW                           PT Education - 05/21/16 1116    Education provided Yes   Education Details education on goals, POC, evaluation findings, discussion about getting involved in MS society   Person(s) Educated Patient;Spouse   Methods Explanation   Comprehension Verbalized understanding          PT Short Term Goals - 05/21/16 1129      PT SHORT TERM GOAL #1   Title Pt will perform initial HEP with mod I using paper handout to maximize progress made in PT. Target date for all STGs: 06/18/16   Time 4   Period Weeks     PT SHORT TERM GOAL #2   Title Will assess BERG balance test and improve score by 3 points from baseline in order to indicate decreased fall risk.    Time 4   Period Weeks   Status New     PT SHORT TERM GOAL #3   Title Pt will improve 5TSS to </=21 secs with single UE support only in order to indicate improved functional strength.    Time 4   Period Weeks   Status New     PT SHORT TERM GOAL #4   Title Pt will  improve TUG to </=32 secs w/ LRAD in order to indicate decreased fall risk.  Time 4   Period Weeks   Status New     PT SHORT TERM GOAL #5   Title Pt will improve gait speed to 1.75 ft/sec w/ LRAD in order to indicate decreased fall risk and improved efficiency of gait.    Time 4   Period Weeks   Status New     Additional Short Term Goals   Additional Short Term Goals Yes     PT SHORT TERM GOAL #6   Title Pt will ambulate x 200' w/ LRAD over unlevel paved surfaces at mod I level in order to indicate return to community.    Time 4   Period Weeks   Status New           PT Long Term Goals - 05/21/16 1138      PT LONG TERM GOAL #1   Title Pt will be independent with final HEP in order to indicate improved functional mobility and decreased fall risk.  (Target Date for all LTGs: 07/16/16)   Time 8   Period Weeks   Status New     PT LONG TERM GOAL #2   Title Pt will improve BERG balance score by 6 points from baseline in order to indicate decreased fall risk.     Time 8   Period Weeks   Status New     PT LONG TERM GOAL #3   Title Pt will improve 5TSS to </=17 secs without UE support in order to indicate improved functional mobility.     Time 8   Period Weeks   Status New     PT LONG TERM GOAL #4   Title Pt will perform TUG in </=28 secs w/ LRAD in order to indicate decreased fall risk.     Time 8   Period Weeks   Status New     PT LONG TERM GOAL #5   Title Pt will improve gait speed to 2.35 ft/sec in order to indicate decreased fall risk and improved efficiency of gait.    Time 8   Period Weeks   Status New     Additional Long Term Goals   Additional Long Term Goals Yes     PT LONG TERM GOAL #6   Title Pt will ambulate up to 150' over indoor surfaces with quad tip cane at S level in order to indicate improved functional independence.     Time 8   Period Weeks   Status New               Plan - 05/21/16 1123    Clinical Impression Statement Pt  presents with recent MS exacerbation with hospitalization on 2/16 with transfer to inpatient rehab following this due to RUE/LE weakness, decreased balance, and decreased activity tolerance/endurance.  Pt with history of another exacerbation in August of last year in which she still feels she has not recovered from fully.  Both hospitalizations were due to UTI/sepsis.  Upon PT evaluation, note 5TSS was 25.31 secs with single UE support indicative of decreased functional strength, gait speed of 1.15 ft/sec with RW indicative of increased fall risk, and TUG time of 36.50 secs with RW indicative of increased fall risk.  Pt is of evolving presentation and moderate complexity from PT POC standpoint.  Pt will benefit from skilled OP neuro PT in order to address deficits.     Rehab Potential Good   Clinical Impairments Affecting Rehab Potential Progressive disease process   PT Frequency 2x / week  PT Duration 8 weeks   PT Treatment/Interventions ADLs/Self Care Home Management;Electrical Stimulation;DME Instruction;Gait training;Stair training;Functional mobility training;Therapeutic activities;Therapeutic exercise;Balance training;Neuromuscular re-education;Patient/family education;Orthotic Fit/Training;Energy conservation;Vestibular;Visual/perceptual remediation/compensation   PT Next Visit Plan perform BERG-state score in goal section, provide pt with HEP for BLE (R>L) strengthening program, add balance as able, does she need foot up brace for RLE-esp when fatigued?, progress to quad tip cane as able   Consulted and Agree with Plan of Care Patient;Family member/caregiver   Family Member Consulted Husband Crystal Park      Patient will benefit from skilled therapeutic intervention in order to improve the following deficits and impairments:  Decreased activity tolerance, Decreased balance, Decreased coordination, Decreased endurance, Decreased mobility, Decreased strength, Dizziness, Increased edema, Impaired  perceived functional ability, Impaired flexibility, Impaired sensation, Impaired UE functional use, Postural dysfunction  Visit Diagnosis: Muscle weakness (generalized) - Plan: PT plan of care cert/re-cert  Unsteadiness on feet - Plan: PT plan of care cert/re-cert  Other abnormalities of gait and mobility - Plan: PT plan of care cert/re-cert  Dizziness and giddiness - Plan: PT plan of care cert/re-cert     Problem List Patient Active Problem List   Diagnosis Date Noted  . Hematuria   . Urinary retention   . Slow transit constipation   . Yeast infection   . Acute lower UTI   . Other complicated headache syndrome   . Acute blood loss anemia   . Reactive hypertension   . Multiple sclerosis exacerbation (HCC) 04/24/2016  . Neurogenic bladder 04/24/2016  . Weakness of both arms   . Weakness of both legs   . Sepsis (HCC) 04/21/2016  . Bilateral hydronephrosis 04/21/2016  . ARF (acute renal failure) (HCC) 04/21/2016  . Sepsis secondary to UTI (HCC) 04/20/2016  . Attention deficit disorder 02/23/2016  . Gait disturbance 11/24/2015  . Other fatigue 11/24/2015  . Urinary urgency 11/24/2015  . Dehydration   . UTI (lower urinary tract infection) 10/29/2015  . Leukocytosis 10/29/2015  . Nausea and vomiting 10/29/2015  . MS (multiple sclerosis) (HCC) 10/26/2014    Harriet Butte, PT, MPT [facility] [address] [place], [place], [address] Phone: [phone]   Fax:  [phone] 05/21/16, 11:46 AM  Name: Crystal Park MRN: [id number] Date of Birth: 1960/11/11

## 2016-05-28 ENCOUNTER — Ambulatory Visit: Payer: Managed Care, Other (non HMO) | Admitting: Rehabilitation

## 2016-05-28 ENCOUNTER — Ambulatory Visit: Payer: Managed Care, Other (non HMO) | Admitting: Occupational Therapy

## 2016-05-28 DIAGNOSIS — M6281 Muscle weakness (generalized): Secondary | ICD-10-CM | POA: Diagnosis not present

## 2016-05-28 DIAGNOSIS — R41840 Attention and concentration deficit: Secondary | ICD-10-CM

## 2016-05-28 DIAGNOSIS — R2689 Other abnormalities of gait and mobility: Secondary | ICD-10-CM

## 2016-05-28 DIAGNOSIS — R2681 Unsteadiness on feet: Secondary | ICD-10-CM

## 2016-05-28 DIAGNOSIS — M25611 Stiffness of right shoulder, not elsewhere classified: Secondary | ICD-10-CM

## 2016-05-28 DIAGNOSIS — M25612 Stiffness of left shoulder, not elsewhere classified: Secondary | ICD-10-CM

## 2016-05-28 DIAGNOSIS — R278 Other lack of coordination: Secondary | ICD-10-CM

## 2016-05-28 NOTE — Therapy (Signed)
[facility] [address] [place], [place], [address] Phone: [phone]   Fax:  [phone]  Physical Therapy Treatment  Patient Details  Name: Crystal Park MRN: [id number] Date of Birth: [birth year] Referring Provider: Daisy Floro, MD  Encounter Date: 05/28/2016      PT End of Session - 05/28/16 1205    Visit Number 2   Number of Visits 17   Date for PT Re-Evaluation 07/20/16   Authorization Type [employer] (60 visit limit for PT)   Authorization - Visit Number 2   Authorization - Number of Visits 60   PT Start Time 0804   PT Stop Time 0845   PT Time Calculation (min) 41 min   Activity Tolerance Patient limited by fatigue   Behavior During Therapy [facility] for tasks assessed/performed      Past Medical History:  Diagnosis Date  . Benign paroxysmal positional vertigo 10/12/2014  . MS (multiple sclerosis) (HCC)    diagnosed 9/16    Past Surgical History:  Procedure Laterality Date  . OOPHORECTOMY Left    2013    There were no vitals filed for this visit.      Subjective Assessment - 05/28/16 0808    Subjective "I've been doing some arm exercises with 1 and 2# weights.  Also I wanted to tell you that I had two slips at home where I could tell my legs were giving out and I grabbed onto something to lower myself down. "   Limitations House hold activities;Walking   Patient Stated Goals "To regain strength and balance."    Currently in Pain? No/denies                         [facility] Adult PT Treatment/Exercise - 05/28/16 0815      Standardized Balance Assessment   Standardized Balance Assessment Berg Balance Test     Berg Balance Test   Sit to Stand Able to stand  independently using hands   Standing Unsupported Able to stand safely 2 minutes   Sitting with Back Unsupported but Feet Supported on Floor or Stool Able to sit safely and securely 2 minutes   Stand to Sit Controls descent by  using hands   Transfers Able to transfer safely, minor use of hands   Standing Unsupported with Eyes Closed Able to stand 10 seconds with supervision   Standing Ubsupported with Feet Together Able to place feet together independently and stand for 1 minute with supervision   From Standing, Reach Forward with Outstretched Arm Reaches forward but needs supervision   From Standing Position, Pick up Object from Floor Able to pick up shoe, needs supervision   From Standing Position, Turn to Look Behind Over each Shoulder Needs supervision when turning  has increased dizziness   Turn 360 Degrees Needs close supervision or verbal cueing  does vestibular/visual compensations   Standing Unsupported, Alternately Place Feet on Step/Stool Able to complete >2 steps/needs minimal assist   Standing Unsupported, One Foot in Front Able to plae foot ahead of the other independently and hold 30 seconds   Standing on One Leg Tries to lift leg/unable to hold 3 seconds but remains standing independently   Total Score 35     Self-Care   Self-Care Other Self-Care Comments   Other Self-Care Comments  Discussed her falls at home and how to prevent over fatigue when ambulating to/from restroom.  Also education on going to her restroom (  that has been made accessible for her) to maintain safety.       Note that pt only able to complete 1-2 items of BERG at a time due to fatigue.             PT Education - 05/28/16 1203    Education provided Yes   Education Details Education on performing sit<>stands at home with emphasis on increased BLE strength   Person(s) Educated Patient   Methods Explanation   Comprehension Verbalized understanding          PT Short Term Goals - 05/21/16 1129      PT SHORT TERM GOAL #1   Title Pt will perform initial HEP with mod I using paper handout to maximize progress made in PT. Target date for all STGs: 06/18/16   Time 4   Period Weeks     PT SHORT TERM GOAL #2   Title  Will assess BERG balance test and improve score by 3 points from baseline in order to indicate decreased fall risk.    Time 4   Period Weeks   Status New     PT SHORT TERM GOAL #3   Title Pt will improve 5TSS to </=21 secs with single UE support only in order to indicate improved functional strength.    Time 4   Period Weeks   Status New     PT SHORT TERM GOAL #4   Title Pt will improve TUG to </=32 secs w/ LRAD in order to indicate decreased fall risk.    Time 4   Period Weeks   Status New     PT SHORT TERM GOAL #5   Title Pt will improve gait speed to 1.75 ft/sec w/ LRAD in order to indicate decreased fall risk and improved efficiency of gait.    Time 4   Period Weeks   Status New     Additional Short Term Goals   Additional Short Term Goals Yes     PT SHORT TERM GOAL #6   Title Pt will ambulate x 200' w/ LRAD over unlevel paved surfaces at mod I level in order to indicate return to community.    Time 4   Period Weeks   Status New           PT Long Term Goals - 05/21/16 1138      PT LONG TERM GOAL #1   Title Pt will be independent with final HEP in order to indicate improved functional mobility and decreased fall risk.  (Target Date for all LTGs: 07/16/16)   Time 8   Period Weeks   Status New     PT LONG TERM GOAL #2   Title Pt will improve BERG balance score by 6 points from baseline in order to indicate decreased fall risk.     Time 8   Period Weeks   Status New     PT LONG TERM GOAL #3   Title Pt will improve 5TSS to </=17 secs without UE support in order to indicate improved functional mobility.     Time 8   Period Weeks   Status New     PT LONG TERM GOAL #4   Title Pt will perform TUG in </=28 secs w/ LRAD in order to indicate decreased fall risk.     Time 8   Period Weeks   Status New     PT LONG TERM GOAL #5   Title Pt will improve gait speed to  2.35 ft/sec in order to indicate decreased fall risk and improved efficiency of gait.    Time 8    Period Weeks   Status New     Additional Long Term Goals   Additional Long Term Goals Yes     PT LONG TERM GOAL #6   Title Pt will ambulate up to 150' over indoor surfaces with quad tip cane at S level in order to indicate improved functional independence.     Time 8   Period Weeks   Status New               Plan - 05/28/16 1206    Clinical Impression Statement Discussed fall prevention at home as she has had two "slips" in which she lowered herself to ground without injury.  Also assessed balance formally with BERG.  Note score of 35/56 in which scores <36 = High risk for falls (close to 100%).  Discussed this with pt.    Rehab Potential Good   Clinical Impairments Affecting Rehab Potential Progressive disease process   PT Frequency 2x / week   PT Duration 8 weeks   PT Treatment/Interventions ADLs/Self Care Home Management;Electrical Stimulation;DME Instruction;Gait training;Stair training;Functional mobility training;Therapeutic activities;Therapeutic exercise;Balance training;Neuromuscular re-education;Patient/family education;Orthotic Fit/Training;Energy conservation;Vestibular;Visual/perceptual remediation/compensation   PT Next Visit Plan  provide pt with HEP for BLE (R>L) strengthening program, add balance as able, does she need foot up brace for RLE-esp when fatigued?, progress to quad tip cane as able, perhaps go over floor recovery as she is having to lower to the floor many times.    Consulted and Agree with Plan of Care Patient;Family member/caregiver   Family Member Consulted Husband Mardelle Matte      Patient will benefit from skilled therapeutic intervention in order to improve the following deficits and impairments:  Decreased activity tolerance, Decreased balance, Decreased coordination, Decreased endurance, Decreased mobility, Decreased strength, Dizziness, Increased edema, Impaired perceived functional ability, Impaired flexibility, Impaired sensation, Impaired UE  functional use, Postural dysfunction  Visit Diagnosis: Muscle weakness (generalized)  Unsteadiness on feet  Other abnormalities of gait and mobility     Problem List Patient Active Problem List   Diagnosis Date Noted  . Hematuria   . Urinary retention   . Slow transit constipation   . Yeast infection   . Acute lower UTI   . Other complicated headache syndrome   . Acute blood loss anemia   . Reactive hypertension   . Multiple sclerosis exacerbation (HCC) 04/24/2016  . Neurogenic bladder 04/24/2016  . Weakness of both arms   . Weakness of both legs   . Sepsis (HCC) 04/21/2016  . Bilateral hydronephrosis 04/21/2016  . ARF (acute renal failure) (HCC) 04/21/2016  . Sepsis secondary to UTI (HCC) 04/20/2016  . Attention deficit disorder 02/23/2016  . Gait disturbance 11/24/2015  . Other fatigue 11/24/2015  . Urinary urgency 11/24/2015  . Dehydration   . UTI (lower urinary tract infection) 10/29/2015  . Leukocytosis 10/29/2015  . Nausea and vomiting 10/29/2015  . MS (multiple sclerosis) (HCC) 10/26/2014    Harriet Butte, PT, MPT [facility] [address] [place], [place], [address] Phone: [phone]   Fax:  [phone] 05/28/16, 12:09 PM  Name: Crystal Park MRN: [id number] Date of Birth: [birth year]

## 2016-05-28 NOTE — Patient Instructions (Addendum)
Toss / Catch: Feet Lifting Ball (Supine)   Upper Extremity: [employer] on back holding 2 pound weight on chest. Hold weight by the ends in both hands with thumbs facing up.  Start with elbows by your side and then push up to the ceiling. Repeat 10 times per set. Rest 1 min after set. Do 2 sets per day.   Flexion - Supine (Dumbbell)    Lie with arms straight. Hold a 2lb weight in both hands on sides of weight with thumbs facing up.  Lift arms keeping elbows straight to chin height. Repeat 10 times per set. Do 2 sets per session.   Wrist Flexion: Resisted   With right palm up, 1 pound weight in hand, bend wrist up. Return slowly. Repeat 10times per set.  Do 2 sessions per day.  Wrist Extension: Resisted   With right palm down, 1 pound weight in hand, bend wrist up. Return slowly. Repeat 10 times per set. Do 2 sessions per day.    Arm Curl    Sit with feet shoulder width apart, arms straight down at sides, palms forward. Inhale, then exhale while slowly curling weights toward shoulders and keeping elbows touching torso. Slowly return to starting position. Repeat 10 times per set. Do 1-2 sets per session. Perform with 1lb weight.  Perform with each arm.    Scapular Retraction (Standing)    Sit With arms at sides, pinch shoulder blades together and bring your arms back with palms facing forward.  Lift head, belly button and chest. Repeat 10 times per set. Hold 10sec. Do 1-2 sessions per day.

## 2016-05-28 NOTE — Therapy (Signed)
[facility] [address] [place], [place], [address] Phone: [phone]   Fax:  [phone]  Occupational Therapy Treatment  Patient Details  Name: Crystal Park MRN: [id number] Date of Birth: 12/15/1960 Referring Provider: Dr. [name]  Encounter Date: 05/28/2016      OT End of Session - 05/28/16 0917    Visit Number 2   Number of Visits 17   Date for OT Re-Evaluation 07/20/16   Authorization Type [employer], 60 visit limit combined OT/ST, no auth needed   OT Start Time 0847   OT Stop Time 0930   OT Time Calculation (min) 43 min   Activity Tolerance Patient tolerated treatment well   Behavior During Therapy [facility] for tasks assessed/performed      Past Medical History:  Diagnosis Date  . Benign paroxysmal positional vertigo 10/12/2014  . MS (multiple sclerosis) (HCC)    diagnosed 9/16    Past Surgical History:  Procedure Laterality Date  . OOPHORECTOMY Left    2013    There were no vitals filed for this visit.      Subjective Assessment - 05/28/16 0849    Subjective  Pt reports doing some exercises with wights at home and has questions.  2 falls where pt "slid down" due to legs giving out.   Patient is accompained by: Family member  husband   Pertinent History MS diagnosis 9/16 (but some symptoms in 2010)   Limitations urinary catheter, fall risk   Patient Stated Goals bathe herself (including transfer), simple cold/microwave meal prep, fold laundry, feed dog, dress mod I (including getting clothes out)   Currently in Pain? No/denies        Flipping cards with R hand for coordination with min difficulty/incr time.  Exercises in supine (see pt instructions) on wedge due to back discomfort.  Multiple short rest breaks due to fatigue.  Discussed current UE exercises pt is doing at home and modified/updated for improved positioning and added exercises.                        OT  Education - 05/28/16 0941    Education Details Initial HEP--see pt instructions   Person(s) Educated Patient   Methods Explanation;Demonstration;Handout;Verbal cues   Comprehension Verbalized understanding;Returned demonstration;Verbal cues required          OT Short Term Goals - 05/21/16 1930      OT SHORT TERM GOAL #1   Title Pt will be independent with initial HEP.--check STGs 06/20/16   Time 4   Period Weeks   Status New     OT SHORT TERM GOAL #2   Title Pt will be able to fold laundry in sitting (with set-up).   Time 4   Period Weeks   Status New     OT SHORT TERM GOAL #3   Title Pt will be able to retrieve clothing prior to dressing mod I.   Time 4   Period Weeks   Status New     OT SHORT TERM GOAL #4   Title Pt will improve bilateral hand coordination for ADLs as shown by improving time on 9-hole peg test by at least 4 sec bilaterally.   Baseline R-34.85sec, L-33.56sec   Time 4   Period Weeks   Status New     OT SHORT TERM GOAL #5   Title Pt will improve R hand strength by at least 5lbs for incr ease with opening packages/containers.  Baseline R-30lbs   Time 4   Period Weeks   Status New     Additional Short Term Goals   Additional Short Term Goals Yes     OT SHORT TERM GOAL #6   Title Pt will verbalize understanding of energy conservation strategies and AE for incr safety and independence with ADLs/IADLs.   Time 4   Period Weeks   Status New           OT Long Term Goals - 05/21/16 1937      OT LONG TERM GOAL #1   Title Pt will be independent with updated HEP.--check LTGs 07/20/16   Time 8   Period Weeks   Status New     OT LONG TERM GOAL #2   Title Pt will perform bathing mod I including shower transfer.   Baseline supervision-min A   Time 8   Period Weeks   Status New     OT LONG TERM GOAL #3   Title Pt will perform simple cold meal/snack prep and warm items in microwave mod I.   Time 8   Period Weeks   Status New     OT LONG TERM  GOAL #4   Title Pt will improve R hand strength by at least 10lbs for incr ease with opening packages/containers.   Baseline 30lbs   Time 8   Period Weeks   Status New     OT LONG TERM GOAL #5   Title Pt will be able to feed dog mod I.   Time 8   Period Weeks   Status New     Long Term Additional Goals   Additional Long Term Goals Yes     OT LONG TERM GOAL #6   Title Pt will verbalize understanding of memory compensation strategies for ADLs prn.   Time 8   Period Weeks   Status New               Plan - 05/28/16 0370    Clinical Impression Statement Pt progressing towards goals and demo good performance of HEP during instruction.  Decr posture/core strength affects shoulder movement.     Rehab Potential Good   OT Frequency 2x / week   OT Duration 8 weeks  +eval   OT Treatment/Interventions Self-care/ADL training;Cryotherapy;Parrafin;Therapeutic exercise;DME and/or AE instruction;Building services engineer;Therapeutic activities;Patient/family education;Balance training;Cognitive remediation/compensation;Manual Therapy;Splinting;Neuromuscular education;Fluidtherapy;Ultrasound;Electrical Stimulation;Moist Heat;Energy conservation;Contrast Bath;Passive range of motion;Therapeutic exercises;Visual/perceptual remediation/compensation   Plan review HEP   Consulted and Agree with Plan of Care Patient;Family member/caregiver   Family Member Consulted husband      Patient will benefit from skilled therapeutic intervention in order to improve the following deficits and impairments:  Decreased balance, Decreased endurance, Decreased mobility, Decreased range of motion, Decreased knowledge of use of DME, Decreased strength, Impaired UE functional use, Impaired flexibility, Decreased activity tolerance, Decreased coordination, Decreased cognition  Visit Diagnosis: Muscle weakness (generalized)  Unsteadiness on feet  Other abnormalities of gait and mobility  Other lack of  coordination  Attention and concentration deficit  Stiffness of right shoulder, not elsewhere classified  Stiffness of left shoulder, not elsewhere classified    Problem List Patient Active Problem List   Diagnosis Date Noted  . Hematuria   . Urinary retention   . Slow transit constipation   . Yeast infection   . Acute lower UTI   . Other complicated headache syndrome   . Acute blood loss anemia   . Reactive hypertension   . Multiple sclerosis exacerbation (  HCC) 04/24/2016  . Neurogenic bladder 04/24/2016  . Weakness of both arms   . Weakness of both legs   . Sepsis (HCC) 04/21/2016  . Bilateral hydronephrosis 04/21/2016  . ARF (acute renal failure) (HCC) 04/21/2016  . Sepsis secondary to UTI (HCC) 04/20/2016  . Attention deficit disorder 02/23/2016  . Gait disturbance 11/24/2015  . Other fatigue 11/24/2015  . Urinary urgency 11/24/2015  . Dehydration   . UTI (lower urinary tract infection) 10/29/2015  . Leukocytosis 10/29/2015  . Nausea and vomiting 10/29/2015  . MS (multiple sclerosis) (HCC) 10/26/2014    [facility] 05/28/2016, 9:42 AM  [facility] [address] [place], [place], [address] Phone: [phone]   Fax:  [phone]  Name: JACKYE DEVER MRN: [id number] Date of Birth: 10-09-1960   Willa Frater, OTR/L [facility] [address] [place], [place]  [address] [phone] phone [phone] 05/28/16 9:42 AM

## 2016-05-31 ENCOUNTER — Encounter: Payer: Self-pay | Admitting: Rehabilitation

## 2016-05-31 ENCOUNTER — Ambulatory Visit: Payer: Managed Care, Other (non HMO) | Admitting: Rehabilitation

## 2016-05-31 ENCOUNTER — Ambulatory Visit: Payer: Managed Care, Other (non HMO) | Admitting: Occupational Therapy

## 2016-05-31 DIAGNOSIS — R278 Other lack of coordination: Secondary | ICD-10-CM

## 2016-05-31 DIAGNOSIS — M25611 Stiffness of right shoulder, not elsewhere classified: Secondary | ICD-10-CM

## 2016-05-31 DIAGNOSIS — M6281 Muscle weakness (generalized): Secondary | ICD-10-CM

## 2016-05-31 DIAGNOSIS — R2689 Other abnormalities of gait and mobility: Secondary | ICD-10-CM

## 2016-05-31 DIAGNOSIS — M25612 Stiffness of left shoulder, not elsewhere classified: Secondary | ICD-10-CM

## 2016-05-31 DIAGNOSIS — R2681 Unsteadiness on feet: Secondary | ICD-10-CM

## 2016-05-31 NOTE — Therapy (Signed)
[facility] [address] [place], [place], [address] Phone: [phone]   Fax:  [phone]  Occupational Therapy Treatment  Patient Details  Name: Crystal Park MRN: [id number] Date of Birth: 1960/06/25 Referring Provider: Dr. [name]  Encounter Date: 05/31/2016      OT End of Session - 05/31/16 1237    Visit Number 3   Number of Visits 17   Date for OT Re-Evaluation 07/20/16   Authorization Type [employer], 60 visit limit combined OT/ST, no auth needed   OT Start Time 1015   OT Stop Time 1100   OT Time Calculation (min) 45 min   Activity Tolerance Patient tolerated treatment well      Past Medical History:  Diagnosis Date  . Benign paroxysmal positional vertigo 10/12/2014  . MS (multiple sclerosis) (HCC)    diagnosed 9/16    Past Surgical History:  Procedure Laterality Date  . OOPHORECTOMY Left    2013    There were no vitals filed for this visit.      Subjective Assessment - 05/31/16 1018    Subjective  I had a bad day yesterday   Patient is accompained by: Family member   Pertinent History MS diagnosis 9/16 (but some symptoms in 2010)   Limitations urinary catheter, fall risk   Patient Stated Goals bathe herself (including transfer), simple cold/microwave meal prep, fold laundry, feed dog, dress mod I (including getting clothes out)   Currently in Pain? No/denies                      OT Treatments/Exercises (OP) - 05/31/16 1230      ADLs   ADL Comments Energy conservations techniques issued and reviewed. Also discussed energy conservation with HEP including modifications if pt is fatigued or has exacerbation of symptoms (2 sets of 5 reps vs. 10 consecutive reps, reducing to 1 lb vs. 2 lbs. for sh. exercises prn). Also discussed/encouraged MS support group and looking into [facility] MS pool program. (Pt reports already having MS society info including local chapter so did not issue)       Exercises   Exercises --  Reviewed HEP w/ clarifications on hand placement for supine ex's for shoulders                OT Education - 05/31/16 1048    Education provided Yes   Education Details Energy conservation techniques   Person(s) Educated Patient   Methods Explanation;Handout   Comprehension Verbalized understanding          OT Short Term Goals - 05/31/16 1241      OT SHORT TERM GOAL #1   Title Pt will be independent with initial HEP.--check STGs 06/20/16   Time 4   Period Weeks   Status On-going     OT SHORT TERM GOAL #2   Title Pt will be able to fold laundry in sitting (with set-up).   Time 4   Period Weeks   Status New     OT SHORT TERM GOAL #3   Title Pt will be able to retrieve clothing prior to dressing mod I.   Time 4   Period Weeks   Status New     OT SHORT TERM GOAL #4   Title Pt will improve bilateral hand coordination for ADLs as shown by improving time on 9-hole peg test by at least 4 sec bilaterally.   Baseline R-34.85sec, L-33.56sec   Time 4  Period Weeks   Status On-going     OT SHORT TERM GOAL #5   Title Pt will improve R hand strength by at least 5lbs for incr ease with opening packages/containers.   Baseline R-30lbs   Time 4   Period Weeks   Status New     OT SHORT TERM GOAL #6   Title Pt will verbalize understanding of energy conservation strategies and AE for incr safety and independence with ADLs/IADLs.   Time 4   Period Weeks   Status On-going           OT Long Term Goals - 05/21/16 1937      OT LONG TERM GOAL #1   Title Pt will be independent with updated HEP.--check LTGs 07/20/16   Time 8   Period Weeks   Status New     OT LONG TERM GOAL #2   Title Pt will perform bathing mod I including shower transfer.   Baseline supervision-min A   Time 8   Period Weeks   Status New     OT LONG TERM GOAL #3   Title Pt will perform simple cold meal/snack prep and warm items in microwave mod I.   Time 8    Period Weeks   Status New     OT LONG TERM GOAL #4   Title Pt will improve R hand strength by at least 10lbs for incr ease with opening packages/containers.   Baseline 30lbs   Time 8   Period Weeks   Status New     OT LONG TERM GOAL #5   Title Pt will be able to feed dog mod I.   Time 8   Period Weeks   Status New     Long Term Additional Goals   Additional Long Term Goals Yes     OT LONG TERM GOAL #6   Title Pt will verbalize understanding of memory compensation strategies for ADLs prn.   Time 8   Period Weeks   Status New               Plan - 05/31/16 1238    Clinical Impression Statement Pt progressing slowly towards goals. Pt requires re-inforcement of HEP d/t memory deficits.    Rehab Potential Good   OT Frequency 2x / week   OT Duration 8 weeks   OT Treatment/Interventions Self-care/ADL training;Cryotherapy;Parrafin;Therapeutic exercise;DME and/or AE instruction;Building services engineer;Therapeutic activities;Patient/family education;Balance training;Cognitive remediation/compensation;Manual Therapy;Splinting;Neuromuscular education;Fluidtherapy;Ultrasound;Electrical Stimulation;Moist Heat;Energy conservation;Contrast Bath;Passive range of motion;Therapeutic exercises;Visual/perceptual remediation/compensation   Plan Pt to bring in HEP to write clarification of hand placement for sh. ex's, and modifications if pt fatigued or exacerbation. Banker to retrieve items for snack prep, laundry task seated (following session: coordination and putty HEP)   Consulted and Agree with Plan of Care Patient      Patient will benefit from skilled therapeutic intervention in order to improve the following deficits and impairments:  Decreased balance, Decreased endurance, Decreased mobility, Decreased range of motion, Decreased knowledge of use of DME, Decreased strength, Impaired UE functional use, Impaired flexibility, Decreased activity tolerance,  Decreased coordination, Decreased cognition  Visit Diagnosis: Muscle weakness (generalized)  Stiffness of right shoulder, not elsewhere classified  Stiffness of left shoulder, not elsewhere classified    Problem List Patient Active Problem List   Diagnosis Date Noted  . Hematuria   . Urinary retention   . Slow transit constipation   . Yeast infection   . Acute lower UTI   . Other  complicated headache syndrome   . Acute blood loss anemia   . Reactive hypertension   . Multiple sclerosis exacerbation (HCC) 04/24/2016  . Neurogenic bladder 04/24/2016  . Weakness of both arms   . Weakness of both legs   . Sepsis (HCC) 04/21/2016  . Bilateral hydronephrosis 04/21/2016  . ARF (acute renal failure) (HCC) 04/21/2016  . Sepsis secondary to UTI (HCC) 04/20/2016  . Attention deficit disorder 02/23/2016  . Gait disturbance 11/24/2015  . Other fatigue 11/24/2015  . Urinary urgency 11/24/2015  . Dehydration   . UTI (lower urinary tract infection) 10/29/2015  . Leukocytosis 10/29/2015  . Nausea and vomiting 10/29/2015  . MS (multiple sclerosis) (HCC) 10/26/2014    Kelli Churn, OTR/L 05/31/2016, 12:44 PM  [facility] [address] [place], [place], [address] Phone: [phone]   Fax:  [phone]  Name: Crystal Park MRN: [id number] Date of Birth: Apr 14, 1960

## 2016-05-31 NOTE — Therapy (Signed)
[facility] [address] [place], [place], [address] Phone: [phone]   Fax:  [phone]  Physical Therapy Treatment  Patient Details  Name: Crystal Park MRN: [id number] Date of Birth: 07/22/1960 Referring Provider: Daisy Floro, MD  Encounter Date: 05/31/2016      PT End of Session - 05/31/16 1107    Visit Number 3   Number of Visits 17   Date for PT Re-Evaluation 07/20/16   Authorization Type [employer] (60 visit limit for PT)   Authorization - Visit Number 3   Authorization - Number of Visits 60   PT Start Time 1100   PT Stop Time 1143   PT Time Calculation (min) 43 min   Activity Tolerance Patient limited by fatigue   Behavior During Therapy [facility] for tasks assessed/performed      Past Medical History:  Diagnosis Date  . Benign paroxysmal positional vertigo 10/12/2014  . MS (multiple sclerosis) (HCC)    diagnosed 9/16    Past Surgical History:  Procedure Laterality Date  . OOPHORECTOMY Left    2013    There were no vitals filed for this visit.      Subjective Assessment - 05/31/16 1105    Subjective No falls or "slips" since last visit.  Standing going well.    Patient is accompained by: Family member   Limitations House hold activities;Walking   Patient Stated Goals "To regain strength and balance."    Currently in Pain? No/denies              TE and NMR:  Provided initial HEP for BLE strength and balance.  See pt instruction for details.                     PT Education - 05/31/16 1224    Education provided Yes   Education Details HEP-how to spread exercises out to avoid fatigue.    Person(s) Educated Patient   Methods Explanation;Demonstration;Handout   Comprehension Verbalized understanding;Returned demonstration          PT Short Term Goals - 05/31/16 1227      PT SHORT TERM GOAL #1   Title Pt will perform initial HEP with mod I using paper handout  to maximize progress made in PT. Target date for all STGs: 06/18/16   Time 4   Period Weeks     PT SHORT TERM GOAL #2   Title Will assess BERG balance test and improve score by 3 points from baseline in order to indicate decreased fall risk.    Baseline 35/56 baseline   Time 4   Period Weeks   Status New     PT SHORT TERM GOAL #3   Title Pt will improve 5TSS to </=21 secs with single UE support only in order to indicate improved functional strength.    Time 4   Period Weeks   Status New     PT SHORT TERM GOAL #4   Title Pt will improve TUG to </=32 secs w/ LRAD in order to indicate decreased fall risk.    Time 4   Period Weeks   Status New     PT SHORT TERM GOAL #5   Title Pt will improve gait speed to 1.75 ft/sec w/ LRAD in order to indicate decreased fall risk and improved efficiency of gait.    Time 4   Period Weeks   Status New     PT SHORT TERM GOAL #6  Title Pt will ambulate x 200' w/ LRAD over unlevel paved surfaces at mod I level in order to indicate return to community.    Time 4   Period Weeks   Status New           PT Long Term Goals - 05/21/16 1138      PT LONG TERM GOAL #1   Title Pt will be independent with final HEP in order to indicate improved functional mobility and decreased fall risk.  (Target Date for all LTGs: 07/16/16)   Time 8   Period Weeks   Status New     PT LONG TERM GOAL #2   Title Pt will improve BERG balance score by 6 points from baseline in order to indicate decreased fall risk.     Time 8   Period Weeks   Status New     PT LONG TERM GOAL #3   Title Pt will improve 5TSS to </=17 secs without UE support in order to indicate improved functional mobility.     Time 8   Period Weeks   Status New     PT LONG TERM GOAL #4   Title Pt will perform TUG in </=28 secs w/ LRAD in order to indicate decreased fall risk.     Time 8   Period Weeks   Status New     PT LONG TERM GOAL #5   Title Pt will improve gait speed to 2.35 ft/sec  in order to indicate decreased fall risk and improved efficiency of gait.    Time 8   Period Weeks   Status New     Additional Long Term Goals   Additional Long Term Goals Yes     PT LONG TERM GOAL #6   Title Pt will ambulate up to 150' over indoor surfaces with quad tip cane at S level in order to indicate improved functional independence.     Time 8   Period Weeks   Status New               Plan - 05/31/16 1107    Clinical Impression Statement Skilled session focused providing and performing initial HEP for BLE strength and balance.  See pt instruction for details.    Rehab Potential Good   Clinical Impairments Affecting Rehab Potential Progressive disease process   PT Frequency 2x / week   PT Duration 8 weeks   PT Treatment/Interventions ADLs/Self Care Home Management;Electrical Stimulation;DME Instruction;Gait training;Stair training;Functional mobility training;Therapeutic activities;Therapeutic exercise;Balance training;Neuromuscular re-education;Patient/family education;Orthotic Fit/Training;Energy conservation;Vestibular;Visual/perceptual remediation/compensation   PT Next Visit Plan  provide pt with HEP for BLE (R>L) strengthening program, add balance as able, does she need foot up brace for RLE-esp when fatigued?, progress to quad tip cane as able, perhaps go over floor recovery as she is having to lower to the floor many times.    Consulted and Agree with Plan of Care Patient;Family member/caregiver   Family Member Consulted Husband Mardelle Matte      Patient will benefit from skilled therapeutic intervention in order to improve the following deficits and impairments:  Decreased activity tolerance, Decreased balance, Decreased coordination, Decreased endurance, Decreased mobility, Decreased strength, Dizziness, Increased edema, Impaired perceived functional ability, Impaired flexibility, Impaired sensation, Impaired UE functional use, Postural dysfunction  Visit  Diagnosis: Muscle weakness (generalized)  Unsteadiness on feet  Other abnormalities of gait and mobility  Other lack of coordination     Problem List Patient Active Problem List   Diagnosis Date Noted  .  Hematuria   . Urinary retention   . Slow transit constipation   . Yeast infection   . Acute lower UTI   . Other complicated headache syndrome   . Acute blood loss anemia   . Reactive hypertension   . Multiple sclerosis exacerbation (HCC) 04/24/2016  . Neurogenic bladder 04/24/2016  . Weakness of both arms   . Weakness of both legs   . Sepsis (HCC) 04/21/2016  . Bilateral hydronephrosis 04/21/2016  . ARF (acute renal failure) (HCC) 04/21/2016  . Sepsis secondary to UTI (HCC) 04/20/2016  . Attention deficit disorder 02/23/2016  . Gait disturbance 11/24/2015  . Other fatigue 11/24/2015  . Urinary urgency 11/24/2015  . Dehydration   . UTI (lower urinary tract infection) 10/29/2015  . Leukocytosis 10/29/2015  . Nausea and vomiting 10/29/2015  . MS (multiple sclerosis) (HCC) 10/26/2014    Harriet Butte, PT, MPT [facility] [address] [place], [place], [address] Phone: [phone]   Fax:  [phone] 05/31/16, 12:30 PM  Name: MUSETTE KISAMORE MRN: [id number] Date of Birth: 11-09-1960

## 2016-05-31 NOTE — Patient Instructions (Addendum)
Bracing With Bridging (Hook-Lying)    With neutral spine, tighten pelvic floor and abdominals and hold. Lift bottom. Repeat __10_ times. Do _1_ times a day.   Copyright  VHI. All rights reserved.   Hip Flexion / Knee Extension: Straight-Leg Raise (Eccentric)    Lie on back. Lift leg with knee straight. Slowly lower leg for 3-5 seconds. _7__ reps per set, __1_ sets per day, _5-7__ days per week. Lower like elevator, stopping at each floor.  Copyright  VHI. All rights reserved.   Abduction: Clam (Eccentric) - Side-Lying    Lie on side with knees bent. Lift top knee, keeping feet together. Keep trunk steady. Slowly lower for 3-5 seconds. _10__ reps per set, _1__ sets per day, _5-7_ days per week. Use yellow band when on your right side.    http://ecce.exer.us/65   Copyright  VHI. All rights reserved.      Tandem Walking    Walk with each foot directly in front of other, heel of one foot touching toes of other foot with each step. Both feet straight ahead.  Walk along kitchen counter top for support as needed.  Repeat forwards and backwards x 2 reps.  Have Mardelle Matte place a chair at the end just in case you need to rest in between reps.     Copyright  VHI. All rights reserved.   Mini Squat: Double Leg    With feet shoulder width apart, reach forward for balance and do a mini squat. Keep knees in line with second toe. Knees do not go past toes.  Have a chair behind you and think about aiming your bottom for the chair when you squat.  Do about a 45 degree bend and then come back up.  Repeat _7__ times per set. Do 5-7 days per week.    http://plyo.exer.us/70   Copyright  VHI. All rights reserved.

## 2016-05-31 NOTE — Patient Instructions (Signed)

## 2016-06-04 ENCOUNTER — Encounter: Payer: Self-pay | Admitting: Rehabilitation

## 2016-06-04 ENCOUNTER — Ambulatory Visit: Payer: Managed Care, Other (non HMO) | Attending: Family Medicine | Admitting: Occupational Therapy

## 2016-06-04 ENCOUNTER — Ambulatory Visit: Payer: Managed Care, Other (non HMO) | Admitting: Rehabilitation

## 2016-06-04 DIAGNOSIS — M6281 Muscle weakness (generalized): Secondary | ICD-10-CM

## 2016-06-04 DIAGNOSIS — M25612 Stiffness of left shoulder, not elsewhere classified: Secondary | ICD-10-CM | POA: Diagnosis present

## 2016-06-04 DIAGNOSIS — R278 Other lack of coordination: Secondary | ICD-10-CM

## 2016-06-04 DIAGNOSIS — R2681 Unsteadiness on feet: Secondary | ICD-10-CM

## 2016-06-04 DIAGNOSIS — R41840 Attention and concentration deficit: Secondary | ICD-10-CM | POA: Diagnosis present

## 2016-06-04 DIAGNOSIS — R2689 Other abnormalities of gait and mobility: Secondary | ICD-10-CM | POA: Insufficient documentation

## 2016-06-04 DIAGNOSIS — M25611 Stiffness of right shoulder, not elsewhere classified: Secondary | ICD-10-CM | POA: Insufficient documentation

## 2016-06-04 NOTE — Therapy (Signed)
[facility] [address] [place], [place], [address] Phone: [phone]   Fax:  [phone]  Physical Therapy Treatment  Patient Details  Name: Crystal Park MRN: [id number] Date of Birth: 20-Jul-1960 Referring Provider: Daisy Floro, MD  Encounter Date: 06/04/2016      PT End of Session - 06/04/16 0853    Visit Number 4   Number of Visits 17   Date for PT Re-Evaluation 07/20/16   Authorization Type [employer] (60 visit limit for PT)   Authorization - Visit Number 4   Authorization - Number of Visits 60   PT Start Time 0847   PT Stop Time 0930   PT Time Calculation (min) 43 min   Activity Tolerance Patient limited by fatigue   Behavior During Therapy [facility] for tasks assessed/performed      Past Medical History:  Diagnosis Date  . Benign paroxysmal positional vertigo 10/12/2014  . MS (multiple sclerosis) (HCC)    diagnosed 9/16    Past Surgical History:  Procedure Laterality Date  . OOPHORECTOMY Left    2013    There were no vitals filed for this visit.      Subjective Assessment - 06/04/16 0852    Subjective reports weekend went well, no falls. Did not take dizziness medication this morning due to making her fatigued.    Limitations House hold activities;Walking   Patient Stated Goals "To regain strength and balance."    Currently in Pain? No/denies                         [facility] Adult PT Treatment/Exercise - 06/04/16 0001      Therapeutic Activites    Therapeutic Activities Other Therapeutic Activities   Other Therapeutic Activities Went over floor recovery during session and how to perform as well as return demonstration from pt.  Educated on how to get down safely, as well as how to get over to sturdy surface before getting up, performing vestibular compensations and how to get onto seated surface without standing all the way to avoid injury or increased dizziness.  Pt able to  perform at min/guard to min A level.  Cues for ensuring that both feet get flat when turning to sit at end of transfer.       Exercises   Exercises Other Exercises   Other Exercises  At counter top: Standing heel raises x 10 reps, standing hip abduction BLE x 10 reps each, standing hip extension BLE x 10 reps each, standing hip flex x 5 reps each with cues for posture and technique throughout.                  PT Education - 06/04/16 1004    Education provided Yes   Education Details floor recovery   Person(s) Educated Patient   Methods Explanation;Demonstration   Comprehension Verbalized understanding;Returned demonstration          PT Short Term Goals - 05/31/16 1227      PT SHORT TERM GOAL #1   Title Pt will perform initial HEP with mod I using paper handout to maximize progress made in PT. Target date for all STGs: 06/18/16   Time 4   Period Weeks     PT SHORT TERM GOAL #2   Title Will assess BERG balance test and improve score by 3 points from baseline in order to indicate decreased fall risk.    Baseline 35/56 baseline  Time 4   Period Weeks   Status New     PT SHORT TERM GOAL #3   Title Pt will improve 5TSS to </=21 secs with single UE support only in order to indicate improved functional strength.    Time 4   Period Weeks   Status New     PT SHORT TERM GOAL #4   Title Pt will improve TUG to </=32 secs w/ LRAD in order to indicate decreased fall risk.    Time 4   Period Weeks   Status New     PT SHORT TERM GOAL #5   Title Pt will improve gait speed to 1.75 ft/sec w/ LRAD in order to indicate decreased fall risk and improved efficiency of gait.    Time 4   Period Weeks   Status New     PT SHORT TERM GOAL #6   Title Pt will ambulate x 200' w/ LRAD over unlevel paved surfaces at mod I level in order to indicate return to community.    Time 4   Period Weeks   Status New           PT Long Term Goals - 05/21/16 1138      PT LONG TERM GOAL #1    Title Pt will be independent with final HEP in order to indicate improved functional mobility and decreased fall risk.  (Target Date for all LTGs: 07/16/16)   Time 8   Period Weeks   Status New     PT LONG TERM GOAL #2   Title Pt will improve BERG balance score by 6 points from baseline in order to indicate decreased fall risk.     Time 8   Period Weeks   Status New     PT LONG TERM GOAL #3   Title Pt will improve 5TSS to </=17 secs without UE support in order to indicate improved functional mobility.     Time 8   Period Weeks   Status New     PT LONG TERM GOAL #4   Title Pt will perform TUG in </=28 secs w/ LRAD in order to indicate decreased fall risk.     Time 8   Period Weeks   Status New     PT LONG TERM GOAL #5   Title Pt will improve gait speed to 2.35 ft/sec in order to indicate decreased fall risk and improved efficiency of gait.    Time 8   Period Weeks   Status New     Additional Long Term Goals   Additional Long Term Goals Yes     PT LONG TERM GOAL #6   Title Pt will ambulate up to 150' over indoor surfaces with quad tip cane at S level in order to indicate improved functional independence.     Time 8   Period Weeks   Status New               Plan - 06/04/16 1004    Clinical Impression Statement Skilled session focused on floor recovery as pt is having several "slips" to the floor at home.  She was able to demonstrate return at min A level with cues for doing more independently.  Also continue to work on BLE strengthening during session.     Rehab Potential Good   Clinical Impairments Affecting Rehab Potential Progressive disease process   PT Frequency 2x / week   PT Duration 8 weeks   PT Treatment/Interventions ADLs/Self Care Home  Management;Electrical Stimulation;DME Instruction;Gait training;Stair training;Functional mobility training;Therapeutic activities;Therapeutic exercise;Balance training;Neuromuscular re-education;Patient/family  education;Orthotic Fit/Training;Energy conservation;Vestibular;Visual/perceptual remediation/compensation   PT Next Visit Plan  provide pt with HEP for BLE (R>L) strengthening program, add balance as able, does she need foot up brace for RLE-esp when fatigued?, progress to quad tip cane as able, endurance   Consulted and Agree with Plan of Care Patient;Family member/caregiver   Family Member Consulted Husband Mardelle Matte      Patient will benefit from skilled therapeutic intervention in order to improve the following deficits and impairments:  Decreased activity tolerance, Decreased balance, Decreased coordination, Decreased endurance, Decreased mobility, Decreased strength, Dizziness, Increased edema, Impaired perceived functional ability, Impaired flexibility, Impaired sensation, Impaired UE functional use, Postural dysfunction  Visit Diagnosis: Unsteadiness on feet  Muscle weakness (generalized)  Other abnormalities of gait and mobility     Problem List Patient Active Problem List   Diagnosis Date Noted  . Hematuria   . Urinary retention   . Slow transit constipation   . Yeast infection   . Acute lower UTI   . Other complicated headache syndrome   . Acute blood loss anemia   . Reactive hypertension   . Multiple sclerosis exacerbation (HCC) 04/24/2016  . Neurogenic bladder 04/24/2016  . Weakness of both arms   . Weakness of both legs   . Sepsis (HCC) 04/21/2016  . Bilateral hydronephrosis 04/21/2016  . ARF (acute renal failure) (HCC) 04/21/2016  . Sepsis secondary to UTI (HCC) 04/20/2016  . Attention deficit disorder 02/23/2016  . Gait disturbance 11/24/2015  . Other fatigue 11/24/2015  . Urinary urgency 11/24/2015  . Dehydration   . UTI (lower urinary tract infection) 10/29/2015  . Leukocytosis 10/29/2015  . Nausea and vomiting 10/29/2015  . MS (multiple sclerosis) (HCC) 10/26/2014    Harriet Butte, PT, MPT [facility] [address]  [address] [place], [place], [address] Phone: [phone]   Fax:  [phone] 06/04/16, 10:07 AM  Name: COLBY CATANESE MRN: [id number] Date of Birth: 22-Feb-1961

## 2016-06-04 NOTE — Therapy (Signed)
[facility] [address] [place], [place], [address] Phone: [phone]   Fax:  [phone]  Occupational Therapy Treatment  Patient Details  Name: Crystal Park MRN: [id number] Date of Birth: 1960/11/06 Referring Provider: Dr. [name]  Encounter Date: 06/04/2016      OT End of Session - 06/04/16 0810    Visit Number 4   Number of Visits 17   Date for OT Re-Evaluation 07/20/16   Authorization Type [employer], 60 visit limit combined OT/ST, no auth needed   OT Start Time 0804   OT Stop Time 0845   OT Time Calculation (min) 41 min   Activity Tolerance Patient tolerated treatment well   Behavior During Therapy [facility] for tasks assessed/performed      Past Medical History:  Diagnosis Date  . Benign paroxysmal positional vertigo 10/12/2014  . MS (multiple sclerosis) (HCC)    diagnosed 9/16    Past Surgical History:  Procedure Laterality Date  . OOPHORECTOMY Left    2013    There were no vitals filed for this visit.      Subjective Assessment - 06/04/16 0810    Subjective  "My right side gets tired"  Pt reports that she can open things with her RUE now   Patient is accompained by: Family member   Pertinent History MS diagnosis 9/16 (but some symptoms in 2010)   Limitations urinary catheter, fall risk   Patient Stated Goals bathe herself (including transfer), simple cold/microwave meal prep, fold laundry, feed dog, dress mod I (including getting clothes out)   Currently in Pain? No/denies      Sitting at table and folding laundry.   Discussed ways to conserve energy with this activity (sit and fold on tabletop, having husband dump laundry out for her, etc).   Pt able to perform this task without rest for small load.  Flipping cards with R hand for coordination with min difficulty and incr time.                      OT Education - 06/04/16 [id number]    Education Details Reviewed HEP and  modifications if fatigued   Person(s) Educated Patient   Methods Explanation;Demonstration;Verbal cues   Comprehension Verbalized understanding;Returned demonstration;Verbal cues required  min cueing          OT Short Term Goals - 05/31/16 1241      OT SHORT TERM GOAL #1   Title Pt will be independent with initial HEP.--check STGs 06/20/16   Time 4   Period Weeks   Status On-going     OT SHORT TERM GOAL #2   Title Pt will be able to fold laundry in sitting (with set-up).   Time 4   Period Weeks   Status New     OT SHORT TERM GOAL #3   Title Pt will be able to retrieve clothing prior to dressing mod I.   Time 4   Period Weeks   Status New     OT SHORT TERM GOAL #4   Title Pt will improve bilateral hand coordination for ADLs as shown by improving time on 9-hole peg test by at least 4 sec bilaterally.   Baseline R-34.85sec, L-33.56sec   Time 4   Period Weeks   Status On-going     OT SHORT TERM GOAL #5   Title Pt will improve R hand strength by at least 5lbs for incr ease with opening packages/containers.   Baseline  R-30lbs   Time 4   Period Weeks   Status New     OT SHORT TERM GOAL #6   Title Pt will verbalize understanding of energy conservation strategies and AE for incr safety and independence with ADLs/IADLs.   Time 4   Period Weeks   Status On-going           OT Long Term Goals - 05/21/16 1937      OT LONG TERM GOAL #1   Title Pt will be independent with updated HEP.--check LTGs 07/20/16   Time 8   Period Weeks   Status New     OT LONG TERM GOAL #2   Title Pt will perform bathing mod I including shower transfer.   Baseline supervision-min A   Time 8   Period Weeks   Status New     OT LONG TERM GOAL #3   Title Pt will perform simple cold meal/snack prep and warm items in microwave mod I.   Time 8   Period Weeks   Status New     OT LONG TERM GOAL #4   Title Pt will improve R hand strength by at least 10lbs for incr ease with opening  packages/containers.   Baseline 30lbs   Time 8   Period Weeks   Status New     OT LONG TERM GOAL #5   Title Pt will be able to feed dog mod I.   Time 8   Period Weeks   Status New     Long Term Additional Goals   Additional Long Term Goals Yes     OT LONG TERM GOAL #6   Title Pt will verbalize understanding of memory compensation strategies for ADLs prn.   Time 8   Period Weeks   Status New               Plan - 06/04/16 1610    Clinical Impression Statement Pt progressing slowly towards goals with improved HEP initial performance.   Rehab Potential Good   Clinical Impairments Affecting Rehab Potential cognitive/memory deficits   OT Frequency 2x / week   OT Duration 8 weeks   OT Treatment/Interventions Self-care/ADL training;Cryotherapy;Parrafin;Therapeutic exercise;DME and/or AE instruction;Building services engineer;Therapeutic activities;Patient/family education;Balance training;Cognitive remediation/compensation;Manual Therapy;Splinting;Neuromuscular education;Fluidtherapy;Ultrasound;Electrical Stimulation;Moist Heat;Energy conservation;Contrast Bath;Passive range of motion;Therapeutic exercises;Visual/perceptual remediation/compensation   Plan coordination/putty HEP; walker safety in kitchen and gathering items for snack prep   OT Home Exercise Plan Education provided:  UE strengthening HEP   Consulted and Agree with Plan of Care Patient      Patient will benefit from skilled therapeutic intervention in order to improve the following deficits and impairments:  Decreased balance, Decreased endurance, Decreased mobility, Decreased range of motion, Decreased knowledge of use of DME, Decreased strength, Impaired UE functional use, Impaired flexibility, Decreased activity tolerance, Decreased coordination, Decreased cognition  Visit Diagnosis: Muscle weakness (generalized)  Unsteadiness on feet  Other abnormalities of gait and mobility  Other lack of  coordination  Stiffness of left shoulder, not elsewhere classified  Stiffness of right shoulder, not elsewhere classified  Attention and concentration deficit    Problem List Patient Active Problem List   Diagnosis Date Noted  . Hematuria   . Urinary retention   . Slow transit constipation   . Yeast infection   . Acute lower UTI   . Other complicated headache syndrome   . Acute blood loss anemia   . Reactive hypertension   . Multiple sclerosis exacerbation (HCC) 04/24/2016  . Neurogenic  bladder 04/24/2016  . Weakness of both arms   . Weakness of both legs   . Sepsis (HCC) 04/21/2016  . Bilateral hydronephrosis 04/21/2016  . ARF (acute renal failure) (HCC) 04/21/2016  . Sepsis secondary to UTI (HCC) 04/20/2016  . Attention deficit disorder 02/23/2016  . Gait disturbance 11/24/2015  . Other fatigue 11/24/2015  . Urinary urgency 11/24/2015  . Dehydration   . UTI (lower urinary tract infection) 10/29/2015  . Leukocytosis 10/29/2015  . Nausea and vomiting 10/29/2015  . MS (multiple sclerosis) (HCC) 10/26/2014    [facility] 06/04/2016, 8:34 AM  [facility] [address] [place], [place], [address] Phone: [phone]   Fax:  [phone]  Name: EVONDA ENGE MRN: [id number] Date of Birth: [birth year]   Willa Frater, OTR/L [facility] [address] [place], [place]  [address] [phone] phone [phone] 06/04/16 8:34 AM

## 2016-06-07 ENCOUNTER — Encounter: Payer: Self-pay | Admitting: Rehabilitation

## 2016-06-07 ENCOUNTER — Ambulatory Visit: Payer: Managed Care, Other (non HMO) | Admitting: Occupational Therapy

## 2016-06-07 ENCOUNTER — Ambulatory Visit: Payer: Managed Care, Other (non HMO) | Admitting: Rehabilitation

## 2016-06-07 DIAGNOSIS — R2689 Other abnormalities of gait and mobility: Secondary | ICD-10-CM

## 2016-06-07 DIAGNOSIS — R2681 Unsteadiness on feet: Secondary | ICD-10-CM

## 2016-06-07 DIAGNOSIS — R41840 Attention and concentration deficit: Secondary | ICD-10-CM

## 2016-06-07 DIAGNOSIS — R278 Other lack of coordination: Secondary | ICD-10-CM

## 2016-06-07 DIAGNOSIS — M6281 Muscle weakness (generalized): Secondary | ICD-10-CM

## 2016-06-07 DIAGNOSIS — M25611 Stiffness of right shoulder, not elsewhere classified: Secondary | ICD-10-CM

## 2016-06-07 DIAGNOSIS — M25612 Stiffness of left shoulder, not elsewhere classified: Secondary | ICD-10-CM

## 2016-06-07 NOTE — Patient Instructions (Addendum)
  Coordination Activities  Perform the following activities for 20 minutes 1 times per day with right hand(s).  Do different activities each day.   Rotate ball in fingertips (clockwise and counter-clockwise).  Toss ball in air and catch with the same hand.  Flip cards 1 at a time as fast as you can.  Deal cards with your thumb (Hold deck in hand and push card off top with thumb).  Shuffle cards.  Pick up coins and stack.  Pick up coins one at a time until you get 5-10 in your hand, then move coins from palm to fingertips to stack one at a time.  Practice writing and/or typing.  Squeeze red putty with your whole hand 15x  Roll out putty and pinch with each finger/thumb.  Roll out 2-3x.  Place coins/buttons in putty and pull out with right hand.

## 2016-06-07 NOTE — Therapy (Signed)
[facility] [address] [place], [place], [address] Phone: [phone]   Fax:  [phone]  Physical Therapy Treatment  Patient Details  Name: Crystal Park MRN: [id number] Date of Birth: February 07, 1961 Referring Provider: Daisy Floro, MD  Encounter Date: 06/07/2016      PT End of Session - 06/07/16 0855    Visit Number 5   Number of Visits 17   Date for PT Re-Evaluation 07/20/16   Authorization Type [employer] (60 visit limit for PT)   Authorization - Visit Number 5   Authorization - Number of Visits 60   PT Start Time 0847   PT Stop Time 0931   PT Time Calculation (min) 44 min   Activity Tolerance Patient limited by fatigue   Behavior During Therapy [facility] for tasks assessed/performed      Past Medical History:  Diagnosis Date  . Benign paroxysmal positional vertigo 10/12/2014  . MS (multiple sclerosis) (HCC)    diagnosed 9/16    Past Surgical History:  Procedure Laterality Date  . OOPHORECTOMY Left    2013    There were no vitals filed for this visit.      Subjective Assessment - 06/07/16 0854    Subjective Reports things are going okay, had hot flash last night, no falls.    Patient is accompained by: Family member   Limitations House hold activities;Walking   Patient Stated Goals "To regain strength and balance."    Currently in Pain? Yes   Pain Score 2    Pain Location Arm   Pain Orientation Right   Pain Descriptors / Indicators Sore   Pain Type Acute pain   Pain Onset Today   Pain Frequency Intermittent   Aggravating Factors  exercise   Pain Relieving Factors rest                         [facility] Adult PT Treatment/Exercise - 06/07/16 0912      Ambulation/Gait   Ambulation/Gait Yes   Ambulation/Gait Assistance 5: Supervision;4: Min assist   Ambulation/Gait Assistance Details Had pt perform gait with RW indoors in order to improve quality and effieciency.  Provided cues  for improved stride length and improved heel to toe contact with initial contact.  Also cued pt for relaxed and upright posture.  She is able to correct stride length, but feel that pt is limited with posture due to tightness in neck/shoulder muscles.  Encouraged her to continue shoulder circles with emphasis on scapular depression/retraction.  Then worked on gait over simulated outdoor gait (due to colder weather) over mats with weights underneath.  Pt states that this simulates going to mailbox and back in which she does over grass as it is less of an incline than driveway.  She was able to perform at S level, therefore provided okay for her to do this at home weather permiting with husband S.  Pt verbalized understanding.  Also worked on gait indoors with quad tip attachment on SPC.  She performed 115' at min/guard level with min cues for posture and continued increased stride length.  She did switch hands from R hand to L hand due to fatigue.  Note no safety difference.  Okayed pt to begin this at home along counter top or back of couch for safety.  Pt verbalized understanding.     Ambulation Distance (Feet) 115 Feet  x 3, 18' x 4 over mats   Assistive device Rolling  walker  quad tip cane   Gait Pattern Step-through pattern;Decreased arm swing - left;Decreased arm swing - right;Decreased stride length;Trunk flexed;Narrow base of support   Ambulation Surface Level;Unlevel;Indoor;Outdoor     Exercises   Other Exercises  Performed supine pectoral stretch over small towel roll with arms in "T" position x 2-3 mins.  Pt tolerated well and encouraged her to do this at home.                 PT Education - 06/07/16 1850    Education provided Yes   Education Details pectoral stretch-did not provide formal picture   Person(s) Educated Patient   Methods Explanation   Comprehension Verbalized understanding;Returned demonstration          PT Short Term Goals - 05/31/16 1227      PT SHORT TERM  GOAL #1   Title Pt will perform initial HEP with mod I using paper handout to maximize progress made in PT. Target date for all STGs: 06/18/16   Time 4   Period Weeks     PT SHORT TERM GOAL #2   Title Will assess BERG balance test and improve score by 3 points from baseline in order to indicate decreased fall risk.    Baseline 35/56 baseline   Time 4   Period Weeks   Status New     PT SHORT TERM GOAL #3   Title Pt will improve 5TSS to </=21 secs with single UE support only in order to indicate improved functional strength.    Time 4   Period Weeks   Status New     PT SHORT TERM GOAL #4   Title Pt will improve TUG to </=32 secs w/ LRAD in order to indicate decreased fall risk.    Time 4   Period Weeks   Status New     PT SHORT TERM GOAL #5   Title Pt will improve gait speed to 1.75 ft/sec w/ LRAD in order to indicate decreased fall risk and improved efficiency of gait.    Time 4   Period Weeks   Status New     PT SHORT TERM GOAL #6   Title Pt will ambulate x 200' w/ LRAD over unlevel paved surfaces at mod I level in order to indicate return to community.    Time 4   Period Weeks   Status New           PT Long Term Goals - 05/21/16 1138      PT LONG TERM GOAL #1   Title Pt will be independent with final HEP in order to indicate improved functional mobility and decreased fall risk.  (Target Date for all LTGs: 07/16/16)   Time 8   Period Weeks   Status New     PT LONG TERM GOAL #2   Title Pt will improve BERG balance score by 6 points from baseline in order to indicate decreased fall risk.     Time 8   Period Weeks   Status New     PT LONG TERM GOAL #3   Title Pt will improve 5TSS to </=17 secs without UE support in order to indicate improved functional mobility.     Time 8   Period Weeks   Status New     PT LONG TERM GOAL #4   Title Pt will perform TUG in </=28 secs w/ LRAD in order to indicate decreased fall risk.     Time 8  Period Weeks   Status New      PT LONG TERM GOAL #5   Title Pt will improve gait speed to 2.35 ft/sec in order to indicate decreased fall risk and improved efficiency of gait.    Time 8   Period Weeks   Status New     Additional Long Term Goals   Additional Long Term Goals Yes     PT LONG TERM GOAL #6   Title Pt will ambulate up to 150' over indoor surfaces with quad tip cane at S level in order to indicate improved functional independence.     Time 8   Period Weeks   Status New               Plan - 06/07/16 0856    Clinical Impression Statement Skilled session focused on improving gait quality with RW, gait over simulated outdoor surfaces to begin walking to mailbox for endurance, and gait with quad tip cane to increase independence in home.  Pt tolerated well with seated rest breaks in between bouts of gait.    Rehab Potential Good   Clinical Impairments Affecting Rehab Potential Progressive disease process   PT Frequency 2x / week   PT Duration 8 weeks   PT Treatment/Interventions ADLs/Self Care Home Management;Electrical Stimulation;DME Instruction;Gait training;Stair training;Functional mobility training;Therapeutic activities;Therapeutic exercise;Balance training;Neuromuscular re-education;Patient/family education;Orthotic Fit/Training;Energy conservation;Vestibular;Visual/perceptual remediation/compensation   PT Next Visit Plan  add balance as able to HEP (she has a lot of exercises from OT as well), does she need foot up brace for RLE-esp when fatigued?, progress to quad tip cane as able, endurance, outdoor gait   Consulted and Agree with Plan of Care Patient   Family Member Consulted --      Patient will benefit from skilled therapeutic intervention in order to improve the following deficits and impairments:  Decreased activity tolerance, Decreased balance, Decreased coordination, Decreased endurance, Decreased mobility, Decreased strength, Dizziness, Increased edema, Impaired perceived functional  ability, Impaired flexibility, Impaired sensation, Impaired UE functional use, Postural dysfunction  Visit Diagnosis: Unsteadiness on feet  Muscle weakness (generalized)  Other abnormalities of gait and mobility     Problem List Patient Active Problem List   Diagnosis Date Noted  . Hematuria   . Urinary retention   . Slow transit constipation   . Yeast infection   . Acute lower UTI   . Other complicated headache syndrome   . Acute blood loss anemia   . Reactive hypertension   . Multiple sclerosis exacerbation (HCC) 04/24/2016  . Neurogenic bladder 04/24/2016  . Weakness of both arms   . Weakness of both legs   . Sepsis (HCC) 04/21/2016  . Bilateral hydronephrosis 04/21/2016  . ARF (acute renal failure) (HCC) 04/21/2016  . Sepsis secondary to UTI (HCC) 04/20/2016  . Attention deficit disorder 02/23/2016  . Gait disturbance 11/24/2015  . Other fatigue 11/24/2015  . Urinary urgency 11/24/2015  . Dehydration   . UTI (lower urinary tract infection) 10/29/2015  . Leukocytosis 10/29/2015  . Nausea and vomiting 10/29/2015  . MS (multiple sclerosis) (HCC) 10/26/2014    Harriet Butte, PT, MPT [facility] [address] [place], [place], [address] Phone: [phone]   Fax:  [phone] 06/07/16, 6:54 PM  Name: Crystal Park MRN: [id number] Date of Birth: 1960/07/22

## 2016-06-07 NOTE — Therapy (Signed)
[facility] [address] [place], [place], [address] Phone: [phone]   Fax:  [phone]  Occupational Therapy Treatment  Patient Details  Name: Crystal Park MRN: [id number] Date of Birth: [birth year] Referring Provider: Dr. [name]  Encounter Date: 06/07/2016      OT End of Session - 06/07/16 0814    Visit Number 5   Number of Visits 17   Date for OT Re-Evaluation 07/20/16   Authorization Type [employer], 60 visit limit combined OT/ST, no auth needed   OT Start Time 0806   OT Stop Time 0845   OT Time Calculation (min) 39 min   Activity Tolerance Patient tolerated treatment well   Behavior During Therapy [facility] for tasks assessed/performed      Past Medical History:  Diagnosis Date  . Benign paroxysmal positional vertigo 10/12/2014  . MS (multiple sclerosis) (HCC)    diagnosed 9/16    Past Surgical History:  Procedure Laterality Date  . OOPHORECTOMY Left    2013    There were no vitals filed for this visit.      Subjective Assessment - 06/07/16 0811    Subjective  "I noticed my signature is getting better"   Patient is accompained by: Family member   Pertinent History MS diagnosis 9/16 (but some symptoms in 2010)   Limitations urinary catheter, fall risk   Patient Stated Goals bathe herself (including transfer), simple cold/microwave meal prep, fold laundry, feed dog, dress mod I (including getting clothes out)   Currently in Pain? No/denies         Pt was able to write 3 sentences and signature with good legibility.                     OT Education - 06/07/16 0819    Education Details Coordination HEP; Red putty HEP   Person(s) Educated Patient   Methods Explanation;Demonstration;Handout;Verbal cues   Comprehension Verbalized understanding;Returned demonstration;Verbal cues required          OT Short Term Goals - 05/31/16 1241      OT SHORT TERM GOAL #1   Title Pt  will be independent with initial HEP.--check STGs 06/20/16   Time 4   Period Weeks   Status On-going     OT SHORT TERM GOAL #2   Title Pt will be able to fold laundry in sitting (with set-up).   Time 4   Period Weeks   Status New     OT SHORT TERM GOAL #3   Title Pt will be able to retrieve clothing prior to dressing mod I.   Time 4   Period Weeks   Status New     OT SHORT TERM GOAL #4   Title Pt will improve bilateral hand coordination for ADLs as shown by improving time on 9-hole peg test by at least 4 sec bilaterally.   Baseline R-34.85sec, L-33.56sec   Time 4   Period Weeks   Status On-going     OT SHORT TERM GOAL #5   Title Pt will improve R hand strength by at least 5lbs for incr ease with opening packages/containers.   Baseline R-30lbs   Time 4   Period Weeks   Status New     OT SHORT TERM GOAL #6   Title Pt will verbalize understanding of energy conservation strategies and AE for incr safety and independence with ADLs/IADLs.   Time 4   Period Weeks   Status On-going  OT Long Term Goals - 05/21/16 1937      OT LONG TERM GOAL #1   Title Pt will be independent with updated HEP.--check LTGs 07/20/16   Time 8   Period Weeks   Status New     OT LONG TERM GOAL #2   Title Pt will perform bathing mod I including shower transfer.   Baseline supervision-min A   Time 8   Period Weeks   Status New     OT LONG TERM GOAL #3   Title Pt will perform simple cold meal/snack prep and warm items in microwave mod I.   Time 8   Period Weeks   Status New     OT LONG TERM GOAL #4   Title Pt will improve R hand strength by at least 10lbs for incr ease with opening packages/containers.   Baseline 30lbs   Time 8   Period Weeks   Status New     OT LONG TERM GOAL #5   Title Pt will be able to feed dog mod I.   Time 8   Period Weeks   Status New     Long Term Additional Goals   Additional Long Term Goals Yes     OT LONG TERM GOAL #6   Title Pt will  verbalize understanding of memory compensation strategies for ADLs prn.   Time 8   Period Weeks   Status New               Plan - 06/07/16 3754    Clinical Impression Statement Pt is progressing towards goals with improving coordination and incr speed/fluidity of movement with R hand.   Rehab Potential Good   Clinical Impairments Affecting Rehab Potential cognitive/memory deficits   OT Frequency 2x / week   OT Duration 8 weeks   OT Treatment/Interventions Self-care/ADL training;Cryotherapy;Parrafin;Therapeutic exercise;DME and/or AE instruction;Building services engineer;Therapeutic activities;Patient/family education;Balance training;Cognitive remediation/compensation;Manual Therapy;Splinting;Neuromuscular education;Fluidtherapy;Ultrasound;Electrical Stimulation;Moist Heat;Energy conservation;Contrast Bath;Passive range of motion;Therapeutic exercises;Visual/perceptual remediation/compensation   Plan walker safety in kitchen and gathering items for snack prep   OT Home Exercise Plan Education provided:  UE strengthening HEP; coordination/red putty HEP 06/07/16   Consulted and Agree with Plan of Care Patient      Patient will benefit from skilled therapeutic intervention in order to improve the following deficits and impairments:  Decreased balance, Decreased endurance, Decreased mobility, Decreased range of motion, Decreased knowledge of use of DME, Decreased strength, Impaired UE functional use, Impaired flexibility, Decreased activity tolerance, Decreased coordination, Decreased cognition  Visit Diagnosis: Muscle weakness (generalized)  Unsteadiness on feet  Other abnormalities of gait and mobility  Other lack of coordination  Stiffness of left shoulder, not elsewhere classified  Stiffness of right shoulder, not elsewhere classified  Attention and concentration deficit    Problem List Patient Active Problem List   Diagnosis Date Noted  . Hematuria   . Urinary  retention   . Slow transit constipation   . Yeast infection   . Acute lower UTI   . Other complicated headache syndrome   . Acute blood loss anemia   . Reactive hypertension   . Multiple sclerosis exacerbation (HCC) 04/24/2016  . Neurogenic bladder 04/24/2016  . Weakness of both arms   . Weakness of both legs   . Sepsis (HCC) 04/21/2016  . Bilateral hydronephrosis 04/21/2016  . ARF (acute renal failure) (HCC) 04/21/2016  . Sepsis secondary to UTI (HCC) 04/20/2016  . Attention deficit disorder 02/23/2016  . Gait disturbance 11/24/2015  . Other  fatigue 11/24/2015  . Urinary urgency 11/24/2015  . Dehydration   . UTI (lower urinary tract infection) 10/29/2015  . Leukocytosis 10/29/2015  . Nausea and vomiting 10/29/2015  . MS (multiple sclerosis) (HCC) 10/26/2014    [facility] 06/07/2016, 8:30 AM  [facility] [address] [place], [place], [address] Phone: [phone]   Fax:  [phone]  Name: Crystal Park MRN: [id number] Date of Birth: [birth year]    Willa Frater, OTR/L [facility] [address] [place], [place]  [address] [phone] phone [phone] 06/07/16 8:30 AM

## 2016-06-11 ENCOUNTER — Ambulatory Visit: Payer: Managed Care, Other (non HMO) | Admitting: Occupational Therapy

## 2016-06-11 ENCOUNTER — Ambulatory Visit: Payer: Managed Care, Other (non HMO) | Admitting: Rehabilitation

## 2016-06-11 DIAGNOSIS — R41840 Attention and concentration deficit: Secondary | ICD-10-CM

## 2016-06-11 DIAGNOSIS — R2689 Other abnormalities of gait and mobility: Secondary | ICD-10-CM

## 2016-06-11 DIAGNOSIS — M6281 Muscle weakness (generalized): Secondary | ICD-10-CM

## 2016-06-11 DIAGNOSIS — R2681 Unsteadiness on feet: Secondary | ICD-10-CM

## 2016-06-11 DIAGNOSIS — R278 Other lack of coordination: Secondary | ICD-10-CM

## 2016-06-11 DIAGNOSIS — M25611 Stiffness of right shoulder, not elsewhere classified: Secondary | ICD-10-CM

## 2016-06-11 DIAGNOSIS — M25612 Stiffness of left shoulder, not elsewhere classified: Secondary | ICD-10-CM

## 2016-06-11 NOTE — Therapy (Signed)
[facility] [address] [place], [place], [address] Phone: [phone]   Fax:  [phone]  Occupational Therapy Treatment  Patient Details  Name: Crystal Park MRN: [id number] Date of Birth: 1960/11/17 Referring Provider: Dr. [name]  Encounter Date: 06/11/2016      OT End of Session - 06/11/16 0820    Visit Number 6   Number of Visits 17   Date for OT Re-Evaluation 07/20/16   Authorization Type [employer], 60 visit limit combined OT/ST, no auth needed   OT Start Time 0804   OT Stop Time 0845   OT Time Calculation (min) 41 min   Activity Tolerance Patient tolerated treatment well   Behavior During Therapy [facility] for tasks assessed/performed      Past Medical History:  Diagnosis Date  . Benign paroxysmal positional vertigo 10/12/2014  . MS (multiple sclerosis) (HCC)    diagnosed 9/16    Past Surgical History:  Procedure Laterality Date  . OOPHORECTOMY Left    2013    There were no vitals filed for this visit.      Subjective Assessment - 06/11/16 0819    Subjective  Pt reports that she uses her cane in the kitchen because she can hold onto the counter.  Pt reports that she was able to get her clothes out of the dresser herself.   Patient is accompained by: Family member   Pertinent History MS diagnosis 9/16 (but some symptoms in 2010)   Limitations urinary catheter, fall risk   Patient Stated Goals bathe herself (including transfer), simple cold/microwave meal prep, fold laundry, feed dog, dress mod I (including getting clothes out)   Currently in Pain? No/denies     Snack prep (making a peanut butter sandwich).  Pt able to gather ingredients using cane and make sandwich.  Pt able to clean up by rinsing dishes and putting in dishwasher and putting away ingredients.  Pt stood for approx for task and perform safely.                          OT Education - 06/11/16 [phone]     Education Details Reviewed coordination/putty HEP; recommended activities to incr activity tolerance/ADL participation at home  (see pt instructions)   Person(s) Educated Patient   Methods Explanation;Verbal cues;Handout   Comprehension Verbalized understanding;Returned demonstration;Verbal cues required          OT Short Term Goals - 06/11/16 0838      OT SHORT TERM GOAL #1   Title Pt will be independent with initial HEP.--check STGs 06/20/16   Time 4   Period Weeks   Status On-going     OT SHORT TERM GOAL #2   Title Pt will be able to fold laundry in sitting (with set-up).   Time 4   Period Weeks   Status New     OT SHORT TERM GOAL #3   Title Pt will be able to retrieve clothing prior to dressing mod I.   Time 4   Period Weeks   Status New     OT SHORT TERM GOAL #4   Title Pt will improve bilateral hand coordination for ADLs as shown by improving time on 9-hole peg test by at least 4 sec bilaterally.   Baseline R-34.85sec, L-33.56sec   Time 4   Period Weeks   Status New     OT SHORT TERM GOAL #5   Title Pt will improve  R hand strength by at least 5lbs for incr ease with opening packages/containers.   Baseline R-30lbs   Time 4   Period Weeks   Status New     OT SHORT TERM GOAL #6   Title Pt will verbalize understanding of energy conservation strategies and AE for incr safety and independence with ADLs/IADLs.   Time 4   Period Weeks   Status On-going           OT Long Term Goals - 05/21/16 1937      OT LONG TERM GOAL #1   Title Pt will be independent with updated HEP.--check LTGs 07/20/16   Time 8   Period Weeks   Status New     OT LONG TERM GOAL #2   Title Pt will perform bathing mod I including shower transfer.   Baseline supervision-min A   Time 8   Period Weeks   Status New     OT LONG TERM GOAL #3   Title Pt will perform simple cold meal/snack prep and warm items in microwave mod I.   Time 8   Period Weeks   Status New     OT LONG TERM  GOAL #4   Title Pt will improve R hand strength by at least 10lbs for incr ease with opening packages/containers.   Baseline 30lbs   Time 8   Period Weeks   Status New     OT LONG TERM GOAL #5   Title Pt will be able to feed dog mod I.   Time 8   Period Weeks   Status New     Long Term Additional Goals   Additional Long Term Goals Yes     OT LONG TERM GOAL #6   Title Pt will verbalize understanding of memory compensation strategies for ADLs prn.   Time 8   Period Weeks   Status New               Plan - 06/11/16 1610    Clinical Impression Statement Pt is progressing towards goals.  Pt did well with simple snack prep with good safety/performance.  Pt also continues to make progress with incr activity tolerance and coordination.   Rehab Potential Good   Clinical Impairments Affecting Rehab Potential cognitive/memory deficits   OT Frequency 2x / week   OT Duration 8 weeks   OT Treatment/Interventions Self-care/ADL training;Cryotherapy;Parrafin;Therapeutic exercise;DME and/or AE instruction;Building services engineer;Therapeutic activities;Patient/family education;Balance training;Cognitive remediation/compensation;Manual Therapy;Splinting;Neuromuscular education;Fluidtherapy;Ultrasound;Electrical Stimulation;Moist Heat;Energy conservation;Contrast Bath;Passive range of motion;Therapeutic exercises;Visual/perceptual remediation/compensation   Plan continue with IADLs, coordination, strength/activity tolerance   OT Home Exercise Plan Education provided:  UE strengthening HEP; coordination/red putty HEP 06/07/16   Consulted and Agree with Plan of Care Patient      Patient will benefit from skilled therapeutic intervention in order to improve the following deficits and impairments:  Decreased balance, Decreased endurance, Decreased mobility, Decreased range of motion, Decreased knowledge of use of DME, Decreased strength, Impaired UE functional use, Impaired flexibility,  Decreased activity tolerance, Decreased coordination, Decreased cognition  Visit Diagnosis: Muscle weakness (generalized)  Other lack of coordination  Other abnormalities of gait and mobility  Unsteadiness on feet  Stiffness of left shoulder, not elsewhere classified  Stiffness of right shoulder, not elsewhere classified  Attention and concentration deficit    Problem List Patient Active Problem List   Diagnosis Date Noted  . Hematuria   . Urinary retention   . Slow transit constipation   . Yeast infection   .  Acute lower UTI   . Other complicated headache syndrome   . Acute blood loss anemia   . Reactive hypertension   . Multiple sclerosis exacerbation (HCC) 04/24/2016  . Neurogenic bladder 04/24/2016  . Weakness of both arms   . Weakness of both legs   . Sepsis (HCC) 04/21/2016  . Bilateral hydronephrosis 04/21/2016  . ARF (acute renal failure) (HCC) 04/21/2016  . Sepsis secondary to UTI (HCC) 04/20/2016  . Attention deficit disorder 02/23/2016  . Gait disturbance 11/24/2015  . Other fatigue 11/24/2015  . Urinary urgency 11/24/2015  . Dehydration   . UTI (lower urinary tract infection) 10/29/2015  . Leukocytosis 10/29/2015  . Nausea and vomiting 10/29/2015  . MS (multiple sclerosis) (HCC) 10/26/2014    [facility] 06/11/2016, 8:43 AM  [facility] [address] [place], [place], [address] Phone: [phone]   Fax:  [phone]  Name: Crystal Park MRN: [id number] Date of Birth: 1961-03-05   Willa Frater, OTR/L [facility] [address] [place], [place]  [address] [phone] phone [phone] 06/11/16 8:43 AM

## 2016-06-11 NOTE — Patient Instructions (Signed)
   While sitting down (have your husband bring you the laundry basket) and fold laundry.  Try to make yourself a sandwich with husband nearby, use walker.  Continue to get your clothes from the dresser before getting dressed.

## 2016-06-11 NOTE — Therapy (Signed)
[facility] [address] [place], [place], [address] Phone: [phone]   Fax:  [phone]  Physical Therapy Treatment  Patient Details  Name: Crystal Park MRN: [id number] Date of Birth: Jun 09, 1960 Referring Provider: Daisy Floro, MD  Encounter Date: 06/11/2016      PT End of Session - 06/11/16 0943    Visit Number 6   Number of Visits 17   Date for PT Re-Evaluation 07/20/16   Authorization Type [employer] (60 visit limit for PT)   Authorization - Visit Number 6   Authorization - Number of Visits 60   PT Start Time [phone]   PT Stop Time 0930   PT Time Calculation (min) 44 min   Activity Tolerance Patient limited by fatigue   Behavior During Therapy [facility] for tasks assessed/performed      Past Medical History:  Diagnosis Date  . Benign paroxysmal positional vertigo 10/12/2014  . MS (multiple sclerosis) (HCC)    diagnosed 9/16    Past Surgical History:  Procedure Laterality Date  . OOPHORECTOMY Left    2013    There were no vitals filed for this visit.      Subjective Assessment - 06/11/16 0853    Subjective Reports no changes, walking along couch with cane, did walk down hall with cane.    Patient is accompained by: Family member   Limitations House hold activities;Walking   Patient Stated Goals "To regain strength and balance."    Currently in Pain? No/denies                         [facility] Adult PT Treatment/Exercise - 06/11/16 0001      Ambulation/Gait   Ambulation/Gait Yes   Ambulation/Gait Assistance 5: Supervision;4: Min guard   Ambulation/Gait Assistance Details Continue to address gait with quad tip cane (pt did bring her personal quad tip cane today) in order to work on quality, endurance, and balance.  Note that she intermittently switches which hand she prefers to ambulate with cane in and at times switches during gait.  Educated to not switch during gait to avoid LOB.   Pt verbalized understanding.  Ambulated x 115' x 2 with cane.  Note that during first rep, she reports increaed dizziness, despite visual targeting with turns.  Allowed pt to break for BLE therex before performing another round of gait.  Performed much better during second rep with improved stride length and posture.  Cues to relax LUE when cane in R hand.     Ambulation Distance (Feet) 115 Feet  x 2 reps   Assistive device --  quad tip attachment cane   Gait Pattern Step-through pattern;Decreased arm swing - left;Decreased arm swing - right;Decreased stride length;Trunk flexed;Narrow base of support   Ambulation Surface Level;Indoor     Exercises   Exercises Other Exercises   Other Exercises  Performed supine BLE bridging x 10 reps, BLE bridging with LEs on physioball to work on improved proximal control x 10 reps, SL hip abduction x 8 reps each, supine SLR x 10 reps each (light assist with RLE).                  PT Education - 06/11/16 [phone]    Education provided Yes   Education Details walking with cane only along couch/counter top or with husband for safety.    Person(s) Educated Patient;Spouse   Methods Explanation   Comprehension Verbalized understanding  PT Short Term Goals - 05/31/16 1227      PT SHORT TERM GOAL #1   Title Pt will perform initial HEP with mod I using paper handout to maximize progress made in PT. Target date for all STGs: 06/18/16   Time 4   Period Weeks     PT SHORT TERM GOAL #2   Title Will assess BERG balance test and improve score by 3 points from baseline in order to indicate decreased fall risk.    Baseline 35/56 baseline   Time 4   Period Weeks   Status New     PT SHORT TERM GOAL #3   Title Pt will improve 5TSS to </=21 secs with single UE support only in order to indicate improved functional strength.    Time 4   Period Weeks   Status New     PT SHORT TERM GOAL #4   Title Pt will improve TUG to </=32 secs w/ LRAD in order to  indicate decreased fall risk.    Time 4   Period Weeks   Status New     PT SHORT TERM GOAL #5   Title Pt will improve gait speed to 1.75 ft/sec w/ LRAD in order to indicate decreased fall risk and improved efficiency of gait.    Time 4   Period Weeks   Status New     PT SHORT TERM GOAL #6   Title Pt will ambulate x 200' w/ LRAD over unlevel paved surfaces at mod I level in order to indicate return to community.    Time 4   Period Weeks   Status New           PT Long Term Goals - 05/21/16 1138      PT LONG TERM GOAL #1   Title Pt will be independent with final HEP in order to indicate improved functional mobility and decreased fall risk.  (Target Date for all LTGs: 07/16/16)   Time 8   Period Weeks   Status New     PT LONG TERM GOAL #2   Title Pt will improve BERG balance score by 6 points from baseline in order to indicate decreased fall risk.     Time 8   Period Weeks   Status New     PT LONG TERM GOAL #3   Title Pt will improve 5TSS to </=17 secs without UE support in order to indicate improved functional mobility.     Time 8   Period Weeks   Status New     PT LONG TERM GOAL #4   Title Pt will perform TUG in </=28 secs w/ LRAD in order to indicate decreased fall risk.     Time 8   Period Weeks   Status New     PT LONG TERM GOAL #5   Title Pt will improve gait speed to 2.35 ft/sec in order to indicate decreased fall risk and improved efficiency of gait.    Time 8   Period Weeks   Status New     Additional Long Term Goals   Additional Long Term Goals Yes     PT LONG TERM GOAL #6   Title Pt will ambulate up to 150' over indoor surfaces with quad tip cane at S level in order to indicate improved functional independence.     Time 8   Period Weeks   Status New  Plan - 06/11/16 0950    Clinical Impression Statement Skilled session focused on quality and balance with gait with quad tip attachment cane as well as BLE strengthening therex.   Note increased dizziness today, but seemed improved following supine therex.  Education to pt and spouse regarding gait with cane at home.    Rehab Potential Good   Clinical Impairments Affecting Rehab Potential Progressive disease process   PT Frequency 2x / week   PT Duration 8 weeks   PT Treatment/Interventions ADLs/Self Care Home Management;Electrical Stimulation;DME Instruction;Gait training;Stair training;Functional mobility training;Therapeutic activities;Therapeutic exercise;Balance training;Neuromuscular re-education;Patient/family education;Orthotic Fit/Training;Energy conservation;Vestibular;Visual/perceptual remediation/compensation   PT Next Visit Plan  add balance as able to HEP (she has a lot of exercises from OT as well), does she need foot up brace for RLE-esp when fatigued?, progress to quad tip cane as able, endurance, outdoor gait   Consulted and Agree with Plan of Care Patient   Family Member Consulted Husband Mardelle Matte      Patient will benefit from skilled therapeutic intervention in order to improve the following deficits and impairments:  Decreased activity tolerance, Decreased balance, Decreased coordination, Decreased endurance, Decreased mobility, Decreased strength, Dizziness, Increased edema, Impaired perceived functional ability, Impaired flexibility, Impaired sensation, Impaired UE functional use, Postural dysfunction  Visit Diagnosis: Other abnormalities of gait and mobility  Muscle weakness (generalized)  Unsteadiness on feet     Problem List Patient Active Problem List   Diagnosis Date Noted  . Hematuria   . Urinary retention   . Slow transit constipation   . Yeast infection   . Acute lower UTI   . Other complicated headache syndrome   . Acute blood loss anemia   . Reactive hypertension   . Multiple sclerosis exacerbation (HCC) 04/24/2016  . Neurogenic bladder 04/24/2016  . Weakness of both arms   . Weakness of both legs   . Sepsis (HCC) 04/21/2016   . Bilateral hydronephrosis 04/21/2016  . ARF (acute renal failure) (HCC) 04/21/2016  . Sepsis secondary to UTI (HCC) 04/20/2016  . Attention deficit disorder 02/23/2016  . Gait disturbance 11/24/2015  . Other fatigue 11/24/2015  . Urinary urgency 11/24/2015  . Dehydration   . UTI (lower urinary tract infection) 10/29/2015  . Leukocytosis 10/29/2015  . Nausea and vomiting 10/29/2015  . MS (multiple sclerosis) (HCC) 10/26/2014    Harriet Butte, PT, MPT [facility] [address] [place], [place], [address] Phone: [phone]   Fax:  [phone] 06/11/16, 9:53 AM  Name: Crystal Park MRN: [id number] Date of Birth: 06-26-1960

## 2016-06-14 ENCOUNTER — Ambulatory Visit: Payer: Managed Care, Other (non HMO) | Admitting: Rehabilitation

## 2016-06-14 ENCOUNTER — Encounter: Payer: Self-pay | Admitting: Rehabilitation

## 2016-06-14 ENCOUNTER — Ambulatory Visit: Payer: Managed Care, Other (non HMO) | Admitting: Occupational Therapy

## 2016-06-14 DIAGNOSIS — M6281 Muscle weakness (generalized): Secondary | ICD-10-CM | POA: Diagnosis not present

## 2016-06-14 DIAGNOSIS — R2681 Unsteadiness on feet: Secondary | ICD-10-CM

## 2016-06-14 DIAGNOSIS — R2689 Other abnormalities of gait and mobility: Secondary | ICD-10-CM

## 2016-06-14 DIAGNOSIS — M25611 Stiffness of right shoulder, not elsewhere classified: Secondary | ICD-10-CM

## 2016-06-14 DIAGNOSIS — R278 Other lack of coordination: Secondary | ICD-10-CM

## 2016-06-14 DIAGNOSIS — M25612 Stiffness of left shoulder, not elsewhere classified: Secondary | ICD-10-CM

## 2016-06-14 DIAGNOSIS — R41840 Attention and concentration deficit: Secondary | ICD-10-CM

## 2016-06-14 NOTE — Therapy (Signed)
[facility] [address] [place], [place], [address] Phone: [phone]   Fax:  [phone]  Occupational Therapy Treatment  Patient Details  Name: Crystal Park MRN: [id number] Date of Birth: 03/04/1961 Referring Provider: Dr. [name]  Encounter Date: 06/14/2016      OT End of Session - 06/14/16 0818    Visit Number 7   Number of Visits 17   Date for OT Re-Evaluation 07/20/16   Authorization Type [employer], 60 visit limit combined OT/ST, no auth needed   OT Start Time 0803   OT Stop Time 0845   OT Time Calculation (min) 42 min   Activity Tolerance Patient tolerated treatment well   Behavior During Therapy [facility] for tasks assessed/performed      Past Medical History:  Diagnosis Date  . Benign paroxysmal positional vertigo 10/12/2014  . MS (multiple sclerosis) (HCC)    diagnosed 9/16    Past Surgical History:  Procedure Laterality Date  . OOPHORECTOMY Left    2013    There were no vitals filed for this visit.      Subjective Assessment - 06/14/16 0807    Subjective  Pt reports that she had a "fall" where she slid down because she turned too fast.  Pt reports that she folded 2 loads of clothes and got her own cold breakfast.  Pt reports exercises are getting easier.   Patient is accompained by: Family member   Pertinent History MS diagnosis 9/16 (but some symptoms in 2010)   Limitations urinary catheter, fall risk   Patient Stated Goals bathe herself (including transfer), simple cold/microwave meal prep, fold laundry, feed dog, dress mod I (including getting clothes out)   Currently in Pain? No/denies          In standing functional reaching incorporating for incr activity tolerance, coordination, and balance with each UE to place/remove clothespins with 1-8lb resistance on vertical pole.  Stood and performed for approx 4 min prior to rest, but then completed remaining in  sitting.                        OT Education - 06/14/16 0829    Education Details Reviewed strengthening HEP   Person(s) Educated Patient   Methods Explanation;Demonstration;Verbal cues   Comprehension Verbalized understanding;Returned demonstration;Verbal cues required          OT Short Term Goals - 06/14/16 0827      OT SHORT TERM GOAL #1   Title Pt will be independent with initial HEP.--check STGs 06/20/16   Time 4   Period Weeks   Status Achieved  06/14/16     OT SHORT TERM GOAL #2   Title Pt will be able to fold laundry in sitting (with set-up).   Time 4   Period Weeks   Status Achieved  06/14/16     OT SHORT TERM GOAL #3   Title Pt will be able to retrieve clothing prior to dressing mod I.   Time 4   Period Weeks   Status Achieved  06/14/16     OT SHORT TERM GOAL #4   Title Pt will improve bilateral hand coordination for ADLs as shown by improving time on 9-hole peg test by at least 4 sec bilaterally.   Baseline R-34.85sec, L-33.56sec   Time 4   Period Weeks   Status New     OT SHORT TERM GOAL #5   Title Pt will improve R hand strength  by at least 5lbs for incr ease with opening packages/containers.   Baseline R-30lbs   Time 4   Period Weeks   Status New     OT SHORT TERM GOAL #6   Title Pt will verbalize understanding of energy conservation strategies and AE for incr safety and independence with ADLs/IADLs.   Time 4   Period Weeks   Status On-going           OT Long Term Goals - 05/21/16 1937      OT LONG TERM GOAL #1   Title Pt will be independent with updated HEP.--check LTGs 07/20/16   Time 8   Period Weeks   Status New     OT LONG TERM GOAL #2   Title Pt will perform bathing mod I including shower transfer.   Baseline supervision-min A   Time 8   Period Weeks   Status New     OT LONG TERM GOAL #3   Title Pt will perform simple cold meal/snack prep and warm items in microwave mod I.   Time 8   Period Weeks    Status New     OT LONG TERM GOAL #4   Title Pt will improve R hand strength by at least 10lbs for incr ease with opening packages/containers.   Baseline 30lbs   Time 8   Period Weeks   Status New     OT LONG TERM GOAL #5   Title Pt will be able to feed dog mod I.   Time 8   Period Weeks   Status New     Long Term Additional Goals   Additional Long Term Goals Yes     OT LONG TERM GOAL #6   Title Pt will verbalize understanding of memory compensation strategies for ADLs prn.   Time 8   Period Weeks   Status New               Plan - 06/14/16 0820    Clinical Impression Statement Pt is progressing towards goals.  Pt demo improving IADL participation and incr activity tolerance.   Rehab Potential Good   Clinical Impairments Affecting Rehab Potential cognitive/memory deficits   OT Frequency 2x / week   OT Duration 8 weeks   OT Treatment/Interventions Self-care/ADL training;Cryotherapy;Parrafin;Therapeutic exercise;DME and/or AE instruction;Building services engineer;Therapeutic activities;Patient/family education;Balance training;Cognitive remediation/compensation;Manual Therapy;Splinting;Neuromuscular education;Fluidtherapy;Ultrasound;Electrical Stimulation;Moist Heat;Energy conservation;Contrast Bath;Passive range of motion;Therapeutic exercises;Visual/perceptual remediation/compensation   Plan continue with IADLs, review energy conservation strategies (check STGs)   OT Home Exercise Plan Education provided:  UE strengthening HEP; coordination/red putty HEP 06/07/16   Consulted and Agree with Plan of Care Patient      Patient will benefit from skilled therapeutic intervention in order to improve the following deficits and impairments:  Decreased balance, Decreased endurance, Decreased mobility, Decreased range of motion, Decreased knowledge of use of DME, Decreased strength, Impaired UE functional use, Impaired flexibility, Decreased activity tolerance, Decreased  coordination, Decreased cognition  Visit Diagnosis: Muscle weakness (generalized)  Stiffness of right shoulder, not elsewhere classified  Stiffness of left shoulder, not elsewhere classified  Other lack of coordination  Unsteadiness on feet  Attention and concentration deficit  Other abnormalities of gait and mobility    Problem List Patient Active Problem List   Diagnosis Date Noted  . Hematuria   . Urinary retention   . Slow transit constipation   . Yeast infection   . Acute lower UTI   . Other complicated headache syndrome   . Acute  blood loss anemia   . Reactive hypertension   . Multiple sclerosis exacerbation (HCC) 04/24/2016  . Neurogenic bladder 04/24/2016  . Weakness of both arms   . Weakness of both legs   . Sepsis (HCC) 04/21/2016  . Bilateral hydronephrosis 04/21/2016  . ARF (acute renal failure) (HCC) 04/21/2016  . Sepsis secondary to UTI (HCC) 04/20/2016  . Attention deficit disorder 02/23/2016  . Gait disturbance 11/24/2015  . Other fatigue 11/24/2015  . Urinary urgency 11/24/2015  . Dehydration   . UTI (lower urinary tract infection) 10/29/2015  . Leukocytosis 10/29/2015  . Nausea and vomiting 10/29/2015  . MS (multiple sclerosis) (HCC) 10/26/2014    [facility] 06/14/2016, 8:42 AM  [facility] [address] [place], [place], [address] Phone: [phone]   Fax:  [phone]  Name: Crystal Park MRN: [id number] Date of Birth: Mar 28, 1960   Willa Frater, OTR/L [facility] [address] [place], [place]  [address] [phone] phone [phone] 06/14/16 8:42 AM

## 2016-06-14 NOTE — Patient Instructions (Signed)
Stand in corner with chair in front of you for safety.     Feet Apart, Arm Motion - Eyes Closed    With eyes closed and feet shoulder width apart, keep arms by your side.  Repeat __3__ times per session for 20 secs. Do __2__ sessions per day.  Copyright  VHI. All rights reserved.   Feet Together, Arm Motion - Eyes Closed    With eyes closed and feet together, keep arms by your side.  Repeat __3__ times per session for 15-20 secs. Do __2__ sessions per day.  Can start with your feet mostly together, as you get more balanced, get your feet closer together.    Copyright  VHI. All rights reserved.

## 2016-06-14 NOTE — Therapy (Signed)
[facility] [address] [place], [place], [address] Phone: [phone]   Fax:  [phone]  Physical Therapy Treatment  Patient Details  Name: Crystal Park MRN: [id number] Date of Birth: 1960-09-26 Referring Provider: Daisy Floro, MD  Encounter Date: 06/14/2016      PT End of Session - 06/14/16 0914    Visit Number 7   Number of Visits 17   Date for PT Re-Evaluation 07/20/16   Authorization Type [employer] (60 visit limit for PT)   Authorization - Visit Number 7   Authorization - Number of Visits 60   PT Start Time 0846   PT Stop Time 0932   PT Time Calculation (min) 46 min   Activity Tolerance Patient limited by fatigue   Behavior During Therapy [facility] for tasks assessed/performed      Past Medical History:  Diagnosis Date  . Benign paroxysmal positional vertigo 10/12/2014  . MS (multiple sclerosis) (HCC)    diagnosed 9/16    Past Surgical History:  Procedure Laterality Date  . OOPHORECTOMY Left    2013    There were no vitals filed for this visit.      Subjective Assessment - 06/14/16 0848    Subjective Reports that she did have a "slip" where she turned too quickly and got dizzy, fell against wall and then went down to floor. No injury.     Patient is accompained by: Family member   Limitations House hold activities;Walking   Patient Stated Goals "To regain strength and balance."    Currently in Pain? Yes   Pain Score 2    Pain Location Shoulder   Pain Orientation Right   Pain Descriptors / Indicators Sore   Pain Type Acute pain   Pain Onset Today   Pain Frequency Intermittent   Aggravating Factors  over head use, repeated use   Pain Relieving Factors rest                         [facility] Adult PT Treatment/Exercise - 06/14/16 0001      Transfers   Transfers Sit to Stand;Stand to Sit   Sit to Stand 6: Modified independent (Device/Increase time)   Stand to Sit 6:  Modified independent (Device/Increase time)   Comments Pt performing at mod I level, however did cue once or twice regarding scooting forward to avoid hitting back of leg on seating surface.      Ambulation/Gait   Ambulation/Gait Yes   Ambulation/Gait Assistance 5: Supervision;4: Min guard   Ambulation/Gait Assistance Details Continue to work on quality of gait with RW and quad tip attachment to [facility].   Pt requires only min cues for relaxed shoulders during gait with RW.  Did have her negotiate curb/ramp with RW for community negotiation, see below.  Then switched to quad tip attachment.  Note improved cane placement today as she has tended to place too far ahead of her in previous sessions.  Again, cues for relaxed opposite UE, relaxed and upright posture.  Good improvement in stride length today as well.    Ambulation Distance (Feet) 115 Feet  x 2 (RW and quad tip) and 85'   Assistive device Rolling walker  quad tip attach   Gait Pattern Step-through pattern;Decreased arm swing - left;Decreased arm swing - right;Decreased stride length;Trunk flexed;Narrow base of support   Ambulation Surface Level;Indoor   Ramp 5: Supervision   Ramp Details (indicate cue type and reason) min  cues for posture when descending ramp   Curb 5: Supervision   Curb Details (indicate cue type and reason) Min cues for sequencing     Neuro Re-ed    Neuro Re-ed Details  Corner balance tasks; standing with feet apart EC x 2 sets of 30 secs with cues for visual targeting prior to closing eyes progressing to feet together x 2 sets of 20 secs with cues for visual targeting.  Note that feet closer together causes more fatigue, therefore recommended rest breaks as needed at home, added to HEP, see pt instruction.      Exercises   Exercises Other Exercises   Other Exercises  Performed seated nustep x 4 mins for overall strength and endurance at level 3 resistance with BUEs/LEs at pace of 40 steps per minute.  Tolerated well.                   PT Education - 06/14/16 0958    Education provided Yes   Education Details Added balance to HEP   Person(s) Educated Patient;Spouse   Methods Handout;Explanation;Demonstration   Comprehension Verbalized understanding;Returned demonstration          PT Short Term Goals - 05/31/16 1227      PT SHORT TERM GOAL #1   Title Pt will perform initial HEP with mod I using paper handout to maximize progress made in PT. Target date for all STGs: 06/18/16   Time 4   Period Weeks     PT SHORT TERM GOAL #2   Title Will assess BERG balance test and improve score by 3 points from baseline in order to indicate decreased fall risk.    Baseline 35/56 baseline   Time 4   Period Weeks   Status New     PT SHORT TERM GOAL #3   Title Pt will improve 5TSS to </=21 secs with single UE support only in order to indicate improved functional strength.    Time 4   Period Weeks   Status New     PT SHORT TERM GOAL #4   Title Pt will improve TUG to </=32 secs w/ LRAD in order to indicate decreased fall risk.    Time 4   Period Weeks   Status New     PT SHORT TERM GOAL #5   Title Pt will improve gait speed to 1.75 ft/sec w/ LRAD in order to indicate decreased fall risk and improved efficiency of gait.    Time 4   Period Weeks   Status New     PT SHORT TERM GOAL #6   Title Pt will ambulate x 200' w/ LRAD over unlevel paved surfaces at mod I level in order to indicate return to community.    Time 4   Period Weeks   Status New           PT Long Term Goals - 05/21/16 1138      PT LONG TERM GOAL #1   Title Pt will be independent with final HEP in order to indicate improved functional mobility and decreased fall risk.  (Target Date for all LTGs: 07/16/16)   Time 8   Period Weeks   Status New     PT LONG TERM GOAL #2   Title Pt will improve BERG balance score by 6 points from baseline in order to indicate decreased fall risk.     Time 8   Period Weeks   Status New      PT LONG  TERM GOAL #3   Title Pt will improve 5TSS to </=17 secs without UE support in order to indicate improved functional mobility.     Time 8   Period Weeks   Status New     PT LONG TERM GOAL #4   Title Pt will perform TUG in </=28 secs w/ LRAD in order to indicate decreased fall risk.     Time 8   Period Weeks   Status New     PT LONG TERM GOAL #5   Title Pt will improve gait speed to 2.35 ft/sec in order to indicate decreased fall risk and improved efficiency of gait.    Time 8   Period Weeks   Status New     Additional Long Term Goals   Additional Long Term Goals Yes     PT LONG TERM GOAL #6   Title Pt will ambulate up to 150' over indoor surfaces with quad tip cane at S level in order to indicate improved functional independence.     Time 8   Period Weeks   Status New               Plan - 06/14/16 6962    Clinical Impression Statement Skilled session focused on gait with RW, negotiation of curb/ramp with RW for community purposes, gait with quad tip cane and addition of balance exercises to HEP.  Educated pt at end of session regarding small bursts of activity as husband states that fall was also related to her being fatigued from doing lots of laundry.     Rehab Potential Good   Clinical Impairments Affecting Rehab Potential Progressive disease process   PT Frequency 2x / week   PT Duration 8 weeks   PT Treatment/Interventions ADLs/Self Care Home Management;Electrical Stimulation;DME Instruction;Gait training;Stair training;Functional mobility training;Therapeutic activities;Therapeutic exercise;Balance training;Neuromuscular re-education;Patient/family education;Orthotic Fit/Training;Energy conservation;Vestibular;Visual/perceptual remediation/compensation   PT Next Visit Plan Start looking at STGs, balance, progress to quad tip cane as able, endurance, outdoor gait   Consulted and Agree with Plan of Care Patient   Family Member Consulted Husband Mardelle Matte       Patient will benefit from skilled therapeutic intervention in order to improve the following deficits and impairments:  Decreased activity tolerance, Decreased balance, Decreased coordination, Decreased endurance, Decreased mobility, Decreased strength, Dizziness, Increased edema, Impaired perceived functional ability, Impaired flexibility, Impaired sensation, Impaired UE functional use, Postural dysfunction  Visit Diagnosis: Unsteadiness on feet  Other abnormalities of gait and mobility  Muscle weakness (generalized)     Problem List Patient Active Problem List   Diagnosis Date Noted  . Hematuria   . Urinary retention   . Slow transit constipation   . Yeast infection   . Acute lower UTI   . Other complicated headache syndrome   . Acute blood loss anemia   . Reactive hypertension   . Multiple sclerosis exacerbation (HCC) 04/24/2016  . Neurogenic bladder 04/24/2016  . Weakness of both arms   . Weakness of both legs   . Sepsis (HCC) 04/21/2016  . Bilateral hydronephrosis 04/21/2016  . ARF (acute renal failure) (HCC) 04/21/2016  . Sepsis secondary to UTI (HCC) 04/20/2016  . Attention deficit disorder 02/23/2016  . Gait disturbance 11/24/2015  . Other fatigue 11/24/2015  . Urinary urgency 11/24/2015  . Dehydration   . UTI (lower urinary tract infection) 10/29/2015  . Leukocytosis 10/29/2015  . Nausea and vomiting 10/29/2015  . MS (multiple sclerosis) (HCC) 10/26/2014   Harriet Butte, PT, MPT [facility] [address] [place], [place], [address] Phone: [phone]   Fax:  [phone] 06/14/16, 10:02 AM  Name: Crystal Park MRN: [id number] Date of Birth: 1960-08-20

## 2016-06-18 ENCOUNTER — Ambulatory Visit: Payer: Managed Care, Other (non HMO) | Admitting: Rehabilitation

## 2016-06-18 ENCOUNTER — Ambulatory Visit: Payer: Managed Care, Other (non HMO) | Admitting: Occupational Therapy

## 2016-06-21 ENCOUNTER — Ambulatory Visit: Payer: Managed Care, Other (non HMO) | Admitting: Rehabilitation

## 2016-06-21 ENCOUNTER — Ambulatory Visit: Payer: Managed Care, Other (non HMO) | Admitting: Occupational Therapy

## 2016-06-21 ENCOUNTER — Encounter: Payer: Self-pay | Admitting: Rehabilitation

## 2016-06-21 DIAGNOSIS — M25611 Stiffness of right shoulder, not elsewhere classified: Secondary | ICD-10-CM

## 2016-06-21 DIAGNOSIS — M25612 Stiffness of left shoulder, not elsewhere classified: Secondary | ICD-10-CM

## 2016-06-21 DIAGNOSIS — M6281 Muscle weakness (generalized): Secondary | ICD-10-CM

## 2016-06-21 DIAGNOSIS — R2689 Other abnormalities of gait and mobility: Secondary | ICD-10-CM

## 2016-06-21 DIAGNOSIS — R41840 Attention and concentration deficit: Secondary | ICD-10-CM

## 2016-06-21 DIAGNOSIS — R278 Other lack of coordination: Secondary | ICD-10-CM

## 2016-06-21 DIAGNOSIS — R2681 Unsteadiness on feet: Secondary | ICD-10-CM

## 2016-06-21 NOTE — Therapy (Signed)
[facility] [address] [place], [place], [address] Phone: [phone]   Fax:  [phone]  Occupational Therapy Treatment  Patient Details  Name: Crystal Park MRN: [id number] Date of Birth: 08-21-1960 Referring Provider: Dr. Lona Kettle  Encounter Date: 06/21/2016      OT End of Session - 06/21/16 0824    Visit Number 8   Number of Visits 17   Date for OT Re-Evaluation 07/20/16   Authorization Type [employer], 60 visit limit combined OT/ST, no auth needed   OT Start Time 0805   OT Stop Time 0845   OT Time Calculation (min) 40 min   Activity Tolerance Patient tolerated treatment well   Behavior During Therapy [facility] for tasks assessed/performed      Past Medical History:  Diagnosis Date  . Benign paroxysmal positional vertigo 10/12/2014  . MS (multiple sclerosis) ([place])    diagnosed 9/16    Past Surgical History:  Procedure Laterality Date  . OOPHORECTOMY Left    2013    There were no vitals filed for this visit.      Subjective Assessment - 06/21/16 0808    Subjective  My walking has improved    Pertinent History MS diagnosis 9/16 (but some symptoms in 2010)   Limitations urinary catheter, fall risk   Patient Stated Goals bathe herself (including transfer), simple cold/microwave meal prep, fold laundry, feed dog, dress mod I (including getting clothes out)   Currently in Pain? No/denies     Therapist  reviewed energy conservation strategies with pt as she was unable to verbalize. Pt can benefit from review. Therapist checked short term goals. Reviewed red putty HEP with RUE for composite grip and pinch. Standing to unload dryer with close supervision, min v.c., with pt reporting some dizziness,  Followed by standing to fold several items the patient required seated rest break due to fatigue. Pt was able to resume folding the rest of her load in standing with close  supervision.                           OT Short Term Goals - 06/21/16 0809      OT SHORT TERM GOAL #1   Title Pt will be independent with initial HEP.--check STGs 06/20/16   Time 4   Period Weeks   Status Achieved  06/14/16     OT SHORT TERM GOAL #2   Title Pt will be able to fold laundry in sitting (with set-up).   Time 4   Period Weeks   Status Achieved  06/14/16     OT SHORT TERM GOAL #3   Title Pt will be able to retrieve clothing prior to dressing mod I.   Time 4   Period Weeks   Status Achieved  06/14/16     OT SHORT TERM GOAL #4   Title Pt will improve bilateral hand coordination for ADLs as shown by improving time on 9-hole peg test by at least 4 sec bilaterally.   Baseline R-34.85sec, L-33.56sec   Time 4   Period Weeks   Status On-going  RUE 33.72 secs LUE 29.75- not fully met     OT SHORT TERM GOAL #5   Title Pt will improve R hand strength by at least 5lbs for incr ease with opening packages/containers.   Baseline R-30lbs   Time 4   Period Weeks   Status Achieved  40 lbs     OT  SHORT TERM GOAL #6   Title Pt will verbalize understanding of energy conservation strategies and AE for incr safety and independence with ADLs/IADLs.   Time 4   Period Weeks   Status On-going  needs reinforcement           OT Long Term Goals - 05/21/16 1937      OT LONG TERM GOAL #1   Title Pt will be independent with updated HEP.--check LTGs 07/20/16   Time 8   Period Weeks   Status New     OT LONG TERM GOAL #2   Title Pt will perform bathing mod I including shower transfer.   Baseline supervision-min A   Time 8   Period Weeks   Status New     OT LONG TERM GOAL #3   Title Pt will perform simple cold meal/snack prep and warm items in microwave mod I.   Time 8   Period Weeks   Status New     OT LONG TERM GOAL #4   Title Pt will improve R hand strength by at least 10lbs for incr ease with opening packages/containers.   Baseline 30lbs   Time  8   Period Weeks   Status New     OT LONG TERM GOAL #5   Title Pt will be able to feed dog mod I.   Time 8   Period Weeks   Status New     Long Term Additional Goals   Additional Long Term Goals Yes     OT LONG TERM GOAL #6   Title Pt will verbalize understanding of memory compensation strategies for ADLs prn.   Time 8   Period Weeks   Status New               Plan - 06/21/16 1314    Clinical Impression Statement Pt is progressing towards goals. She demonstrates improved RUE grip strength for ADLs.   Rehab Potential Good   Clinical Impairments Affecting Rehab Potential cognitive/memory deficits   OT Frequency 2x / week   OT Duration 8 weeks   OT Treatment/Interventions Self-care/ADL training;Cryotherapy;Parrafin;Therapeutic exercise;DME and/or AE instruction;Building services engineer;Therapeutic activities;Patient/family education;Balance training;Cognitive remediation/compensation;Manual Therapy;Splinting;Neuromuscular education;Fluidtherapy;Ultrasound;Electrical Stimulation;Moist Heat;Energy conservation;Contrast Bath;Passive range of motion;Therapeutic exercises;Visual/perceptual remediation/compensation   Plan continue to address strength and standing balance for ADLs.   Consulted and Agree with Plan of Care Patient      Patient will benefit from skilled therapeutic intervention in order to improve the following deficits and impairments:  Decreased balance, Decreased endurance, Decreased mobility, Decreased range of motion, Decreased knowledge of use of DME, Decreased strength, Impaired UE functional use, Impaired flexibility, Decreased activity tolerance, Decreased coordination, Decreased cognition  Visit Diagnosis: Muscle weakness (generalized)  Stiffness of right shoulder, not elsewhere classified  Stiffness of left shoulder, not elsewhere classified  Other lack of coordination  Attention and concentration deficit    Problem List Patient Active  Problem List   Diagnosis Date Noted  . Hematuria   . Urinary retention   . Slow transit constipation   . Yeast infection   . Acute lower UTI   . Other complicated headache syndrome   . Acute blood loss anemia   . Reactive hypertension   . Multiple sclerosis exacerbation (HCC) 04/24/2016  . Neurogenic bladder 04/24/2016  . Weakness of both arms   . Weakness of both legs   . Sepsis (HCC) 04/21/2016  . Bilateral hydronephrosis 04/21/2016  . ARF (acute renal failure) (HCC) 04/21/2016  . Sepsis secondary  to UTI ([place]) 04/20/2016  . Attention deficit disorder 02/23/2016  . Gait disturbance 11/24/2015  . Other fatigue 11/24/2015  . Urinary urgency 11/24/2015  . Dehydration   . UTI (lower urinary tract infection) 10/29/2015  . Leukocytosis 10/29/2015  . Nausea and vomiting 10/29/2015  . MS (multiple sclerosis) ([place]) 10/26/2014    Lylian Sanagustin 06/21/2016, 8:25 AM  [facility] [address] [place], [place], [address] Phone: [phone]   Fax:  [phone]  Name: MEGEAN FABIO MRN: [id number] Date of Birth: 17-Jan-1961

## 2016-06-21 NOTE — Therapy (Signed)
[facility] [address] [place], [place], [address] Phone: [phone]   Fax:  [phone]  Physical Therapy Treatment  Patient Details  Name: Crystal Park MRN: [id number] Date of Birth: 10/12/1960 Referring Provider: Lawerance Cruel, MD  Encounter Date: 06/21/2016      PT End of Session - 06/21/16 1026    Visit Number 8   Number of Visits 17   Date for PT Re-Evaluation 07/20/16   Authorization Type [employer] (60 visit limit for PT)   Authorization - Visit Number 8   Authorization - Number of Visits 60   PT Start Time 0845   PT Stop Time 0931   PT Time Calculation (min) 46 min   Activity Tolerance Patient limited by fatigue   Behavior During Therapy [facility] for tasks assessed/performed      Past Medical History:  Diagnosis Date  . Benign paroxysmal positional vertigo 10/12/2014  . MS (multiple sclerosis) ([place])    diagnosed 9/16    Past Surgical History:  Procedure Laterality Date  . OOPHORECTOMY Left    2013    There were no vitals filed for this visit.      Subjective Assessment - 06/21/16 0848    Subjective Reports to PT that she has been doing a little more with cane at home with husband providing S.     Patient is accompained by: Family member   Limitations House hold activities;Walking   Patient Stated Goals "To regain strength and balance."    Currently in Pain? No/denies                         [facility] Adult PT Treatment/Exercise - 06/21/16 0853      Transfers   Transfers Sit to Stand;Stand to Sit   Sit to Stand 7: Independent   Five time sit to stand comments  18.25 without UE support   Stand to Sit 7: Independent     Ambulation/Gait   Ambulation/Gait Yes   Ambulation/Gait Assistance 5: Supervision   Assistive device Rolling walker   Gait Pattern Step-through pattern;Decreased arm swing - left;Decreased arm swing - right;Decreased stride length;Trunk flexed;Narrow base  of support   Ambulation Surface Level;Indoor   Gait velocity 1.13 ft/sec with RW     Standardized Balance Assessment   Standardized Balance Assessment Berg Balance Test     Berg Balance Test   Sit to Stand Able to stand without using hands and stabilize independently   Standing Unsupported Able to stand safely 2 minutes   Sitting with Back Unsupported but Feet Supported on Floor or Stool Able to sit safely and securely 2 minutes   Stand to Sit Sits safely with minimal use of hands   Transfers Able to transfer safely, minor use of hands   Standing Unsupported with Eyes Closed Able to stand 10 seconds safely   Standing Ubsupported with Feet Together Able to place feet together independently and stand for 1 minute with supervision   From Standing, Reach Forward with Outstretched Arm Can reach forward >12 cm safely (5")   From Standing Position, Pick up Object from Floor Able to pick up shoe, needs supervision   From Standing Position, Turn to Look Behind Over each Shoulder Turn sideways only but maintains balance  using visual compensations due to dizziness   Turn 360 Degrees Able to turn 360 degrees safely but slowly  using visual compensations due to dizzines   Standing Unsupported, Alternately Place  Feet on Step/Stool Able to complete 4 steps without aid or supervision   Standing Unsupported, One Foot in Front Able to plae foot ahead of the other independently and hold 30 seconds   Standing on One Leg Able to lift leg independently and hold equal to or more than 3 seconds  on R leg   Total Score 44     Timed Up and Go Test   TUG Normal TUG   Normal TUG (seconds) 34.19  with RW, 44.07 w/ quad tip cane                  PT Short Term Goals - 06/21/16 0849      PT SHORT TERM GOAL #1   Title Pt will perform initial HEP with mod I using paper handout to maximize progress made in PT. Target date for all STGs: 06/18/16   Time 4   Period Weeks     PT SHORT TERM GOAL #2    Title Will assess BERG balance test and improve score by 3 points from baseline in order to indicate decreased fall risk.    Baseline 35/56 baseline, 44/56 on 06/21/16   Time 4   Period Weeks   Status Achieved     PT SHORT TERM GOAL #3   Title Pt will improve 5TSS to </=21 secs with single UE support only in order to indicate improved functional strength.    Baseline 18.25 secs without UE support 06/21/16   Time 4   Period Weeks   Status Achieved     PT SHORT TERM GOAL #4   Title Pt will improve TUG to </=32 secs w/ LRAD in order to indicate decreased fall risk.    Baseline 34.19 secs with RW, 44.07 secs with quad tip cane   Time 4   Period Weeks   Status Partially Met     PT SHORT TERM GOAL #5   Title Pt will improve gait speed to 1.75 ft/sec w/ LRAD in order to indicate decreased fall risk and improved efficiency of gait.    Baseline 1.13 ft/sec with RW on 06/21/16   Time 4   Period Weeks   Status Not Met     PT SHORT TERM GOAL #6   Title Pt will ambulate x 200' w/ LRAD over unlevel paved surfaces at mod I level in order to indicate return to community.    Time 4   Period Weeks   Status New           PT Long Term Goals - 05/21/16 1138      PT LONG TERM GOAL #1   Title Pt will be independent with final HEP in order to indicate improved functional mobility and decreased fall risk.  (Target Date for all LTGs: 07/16/16)   Time 8   Period Weeks   Status New     PT LONG TERM GOAL #2   Title Pt will improve BERG balance score by 6 points from baseline in order to indicate decreased fall risk.     Time 8   Period Weeks   Status New     PT LONG TERM GOAL #3   Title Pt will improve 5TSS to </=17 secs without UE support in order to indicate improved functional mobility.     Time 8   Period Weeks   Status New     PT LONG TERM GOAL #4   Title Pt will perform TUG in </=28 secs w/ LRAD in  order to indicate decreased fall risk.     Time 8   Period Weeks   Status New      PT LONG TERM GOAL #5   Title Pt will improve gait speed to 2.35 ft/sec in order to indicate decreased fall risk and improved efficiency of gait.    Time 8   Period Weeks   Status New     Additional Long Term Goals   Additional Long Term Goals Yes     PT LONG TERM GOAL #6   Title Pt will ambulate up to 150' over indoor surfaces with quad tip cane at S level in order to indicate improved functional independence.     Time 8   Period Weeks   Status New               Plan - 06/21/16 1027    Clinical Impression Statement Skilled session focused on addressing STGs.  Note she has met 2/6 and partially met TUG goal.  Note gait speed is about the same as baseline, and did not have time to assess HEP or outdoor gait.  Pt making good progress towards goals.    Rehab Potential Good   Clinical Impairments Affecting Rehab Potential Progressive disease process   PT Frequency 2x / week   PT Duration 8 weeks   PT Treatment/Interventions ADLs/Self Care Home Management;Electrical Stimulation;DME Instruction;Gait training;Stair training;Functional mobility training;Therapeutic activities;Therapeutic exercise;Balance training;Neuromuscular re-education;Patient/family education;Orthotic Fit/Training;Energy conservation;Vestibular;Visual/perceptual remediation/compensation   PT Next Visit Plan Check HEP and outdoor gait STG, balance, progress to quad tip cane as able, endurance, outdoor gait   Consulted and Agree with Plan of Care Patient   Family Member Consulted Husband [name]      Patient will benefit from skilled therapeutic intervention in order to improve the following deficits and impairments:  Decreased activity tolerance, Decreased balance, Decreased coordination, Decreased endurance, Decreased mobility, Decreased strength, Dizziness, Increased edema, Impaired perceived functional ability, Impaired flexibility, Impaired sensation, Impaired UE functional use, Postural dysfunction  Visit  Diagnosis: Muscle weakness (generalized)  Unsteadiness on feet  Other abnormalities of gait and mobility     Problem List Patient Active Problem List   Diagnosis Date Noted  . Hematuria   . Urinary retention   . Slow transit constipation   . Yeast infection   . Acute lower UTI   . Other complicated headache syndrome   . Acute blood loss anemia   . Reactive hypertension   . Multiple sclerosis exacerbation ([place]) 04/24/2016  . Neurogenic bladder 04/24/2016  . Weakness of both arms   . Weakness of both legs   . Sepsis ([place]) 04/21/2016  . Bilateral hydronephrosis 04/21/2016  . ARF (acute renal failure) ([place]) 04/21/2016  . Sepsis secondary to UTI ([place]) 04/20/2016  . Attention deficit disorder 02/23/2016  . Gait disturbance 11/24/2015  . Other fatigue 11/24/2015  . Urinary urgency 11/24/2015  . Dehydration   . UTI (lower urinary tract infection) 10/29/2015  . Leukocytosis 10/29/2015  . Nausea and vomiting 10/29/2015  . MS (multiple sclerosis) ([place]) 10/26/2014    Cameron Sprang, PT, MPT [facility] [address] [place], [place], [address] Phone: [phone]   Fax:  [phone] 06/21/16, 10:29 AM  Name: Crystal Park MRN: [id number] Date of Birth: 01-30-1961

## 2016-06-22 ENCOUNTER — Ambulatory Visit: Payer: Managed Care, Other (non HMO) | Admitting: Rehabilitation

## 2016-06-25 ENCOUNTER — Ambulatory Visit: Payer: Managed Care, Other (non HMO) | Admitting: Rehabilitation

## 2016-06-25 ENCOUNTER — Encounter: Payer: Self-pay | Admitting: Rehabilitation

## 2016-06-25 ENCOUNTER — Ambulatory Visit: Payer: Managed Care, Other (non HMO) | Admitting: Occupational Therapy

## 2016-06-25 DIAGNOSIS — R2689 Other abnormalities of gait and mobility: Secondary | ICD-10-CM

## 2016-06-25 DIAGNOSIS — R278 Other lack of coordination: Secondary | ICD-10-CM

## 2016-06-25 DIAGNOSIS — M6281 Muscle weakness (generalized): Secondary | ICD-10-CM

## 2016-06-25 DIAGNOSIS — R41840 Attention and concentration deficit: Secondary | ICD-10-CM

## 2016-06-25 DIAGNOSIS — M25611 Stiffness of right shoulder, not elsewhere classified: Secondary | ICD-10-CM

## 2016-06-25 DIAGNOSIS — R2681 Unsteadiness on feet: Secondary | ICD-10-CM

## 2016-06-25 DIAGNOSIS — M25612 Stiffness of left shoulder, not elsewhere classified: Secondary | ICD-10-CM

## 2016-06-25 NOTE — Patient Instructions (Signed)
Bracing With Bridging (Hook-Lying)    With neutral spine, tighten pelvic floor and abdominals and hold. Lift bottom. Repeat __10_ times. Do _1_ times a day.   Copyright  VHI. All rights reserved.   Hip Flexion / Knee Extension: Straight-Leg Raise (Eccentric)    Lie on back. Lift leg with knee straight. Slowly lower leg for 3-5 seconds. _7__ reps per set, __1_ sets per day, _5-7__ days per week. Lower like elevator, stopping at each floor.  Copyright  VHI. All rights reserved.   Abduction: Clam (Eccentric) - Side-Lying    Lie on side with knees bent. Lift top knee, keeping feet together. Keep trunk steady. Slowly lower for 3-5 seconds. _10__ reps per set, _1__ sets per day, _5-7_ days per week. Use yellow band when on your right side.    http://ecce.exer.us/65   Copyright  VHI. All rights reserved.      Tandem Walking    Walk with each foot directly in front of other, heel of one foot touching toes of other foot with each step. Both feet straight ahead.  Walk along kitchen counter top for support as needed.  Repeat forwards and backwards x 2 reps.  Have Andy place a chair at the end just in case you need to rest in between reps.     Copyright  VHI. All rights reserved.   Mini Squat: Double Leg    With feet shoulder width apart, reach forward for balance and do a mini squat. Keep knees in line with second toe. Knees do not go past toes.  Have a chair behind you and think about aiming your bottom for the chair when you squat.  Do about a 45 degree bend and then come back up.  Repeat _7__ times per set. Do 5-7 days per week.    http://plyo.exer.us/70   Copyright  VHI. All rights reserved.   

## 2016-06-25 NOTE — Therapy (Signed)
[facility] [address] [place], [place], [address] Phone: [phone]   Fax:  [phone]  Physical Therapy Treatment  Patient Details  Name: Crystal Park MRN: [id number] Date of Birth: 31-May-1960 Referring Provider: Lawerance Cruel, MD  Encounter Date: 06/25/2016      PT End of Session - 06/25/16 1038    Visit Number 9   Number of Visits 17   Date for PT Re-Evaluation 07/20/16   Authorization Type [employer] (60 visit limit for PT)   Authorization - Visit Number 9   Authorization - Number of Visits 60   PT Start Time 0845   PT Stop Time 0931   PT Time Calculation (min) 46 min   Activity Tolerance Patient limited by fatigue   Behavior During Therapy [facility] for tasks assessed/performed      Past Medical History:  Diagnosis Date  . Benign paroxysmal positional vertigo 10/12/2014  . MS (multiple sclerosis) ([place])    diagnosed 9/16    Past Surgical History:  Procedure Laterality Date  . OOPHORECTOMY Left    2013    There were no vitals filed for this visit.      Subjective Assessment - 06/25/16 0849    Subjective Pt reports no falls, no changes.  Did go over to a friend's house over the weekend.     Patient is accompained by: Family member   Limitations House hold activities;Walking   Patient Stated Goals "To regain strength and balance."    Currently in Pain? No/denies                         [facility] Adult PT Treatment/Exercise - 06/25/16 0001      Ambulation/Gait   Ambulation/Gait Yes   Ambulation/Gait Assistance 6: Modified independent (Device/Increase time)   Ambulation/Gait Assistance Details Assessed STG for gait over paved unlevel outdoor surfaces with RW.  Note that she is able to ambulate both over paved and grassy surfaces with RW at mod I level (recommend S over grass from a fatigue standpoint) up to 275'.  Pt able to use visual compensatory strategies during gait to avoid  increaesd dizziness.   Pt requires increased time to complete task, esp over grassy surface.  Pt moderately fatigued following gait outdoors and requires increased seated rest break to recover once indoors.    Ambulation Distance (Feet) 275 Feet   Assistive device Rolling walker   Gait Pattern Step-through pattern;Decreased arm swing - left;Decreased arm swing - right;Decreased stride length;Trunk flexed;Narrow base of support   Ambulation Surface Level;Unlevel;Outdoor;Indoor;Paved;Grass   Curb 6: Modified independent (Device/increase time)   Curb Details (indicate cue type and reason) mod I with RW     Neuro Re-ed    Neuro Re-ed Details  Went over tandem walking as part of HEP, see pt instruction for details     Exercises   Exercises Other Exercises   Other Exercises  Had pt go through HEP with supine and sit<>stand exercises.  See HEP for details on exercises and reps performed, but note that she was able to recall all without use of handout.                  PT Education - 06/25/16 1038    Education provided Yes   Education Details beginning to work on gait with quad tip cane and carrying item at next session   Person(s) Educated Patient;Spouse   Methods Explanation   Comprehension Verbalized  understanding          PT Short Term Goals - 06/25/16 4680      PT SHORT TERM GOAL #1   Title Pt will perform initial HEP with mod I using paper handout to maximize progress made in PT. Target date for all STGs: 06/18/16   Baseline met    Time 4   Period Weeks   Status Achieved     PT SHORT TERM GOAL #2   Title Will assess BERG balance test and improve score by 3 points from baseline in order to indicate decreased fall risk.    Baseline 35/56 baseline, 44/56 on 06/21/16   Time 4   Period Weeks   Status Achieved     PT SHORT TERM GOAL #3   Title Pt will improve 5TSS to </=21 secs with single UE support only in order to indicate improved functional strength.    Baseline 18.25  secs without UE support 06/21/16   Time 4   Period Weeks   Status Achieved     PT SHORT TERM GOAL #4   Title Pt will improve TUG to </=32 secs w/ LRAD in order to indicate decreased fall risk.    Baseline 34.19 secs with RW, 44.07 secs with quad tip cane   Time 4   Period Weeks   Status Partially Met     PT SHORT TERM GOAL #5   Title Pt will improve gait speed to 1.75 ft/sec w/ LRAD in order to indicate decreased fall risk and improved efficiency of gait.    Baseline 1.13 ft/sec with RW on 06/21/16   Time 4   Period Weeks   Status Not Met     PT SHORT TERM GOAL #6   Title Pt will ambulate x 200' w/ LRAD over unlevel paved surfaces at mod I level in order to indicate return to community.    Baseline met 06/25/16   Time 4   Period Weeks   Status Achieved           PT Long Term Goals - 05/21/16 1138      PT LONG TERM GOAL #1   Title Pt will be independent with final HEP in order to indicate improved functional mobility and decreased fall risk.  (Target Date for all LTGs: 07/16/16)   Time 8   Period Weeks   Status New     PT LONG TERM GOAL #2   Title Pt will improve BERG balance score by 6 points from baseline in order to indicate decreased fall risk.     Time 8   Period Weeks   Status New     PT LONG TERM GOAL #3   Title Pt will improve 5TSS to </=17 secs without UE support in order to indicate improved functional mobility.     Time 8   Period Weeks   Status New     PT LONG TERM GOAL #4   Title Pt will perform TUG in </=28 secs w/ LRAD in order to indicate decreased fall risk.     Time 8   Period Weeks   Status New     PT LONG TERM GOAL #5   Title Pt will improve gait speed to 2.35 ft/sec in order to indicate decreased fall risk and improved efficiency of gait.    Time 8   Period Weeks   Status New     Additional Long Term Goals   Additional Long Term Goals Yes  PT LONG TERM GOAL #6   Title Pt will ambulate up to 150' over indoor surfaces with quad tip  cane at S level in order to indicate improved functional independence.     Time 8   Period Weeks   Status New               Plan - 06/25/16 1039    Clinical Impression Statement Session focused on addressing remaining STGs.  Overall, she has met 4/6 LTGs, partially meeting TUG goal.  She continues to make steady progress towards LTGs.  Continue POC.     Rehab Potential Good   Clinical Impairments Affecting Rehab Potential Progressive disease process   PT Frequency 2x / week   PT Duration 8 weeks   PT Treatment/Interventions ADLs/Self Care Home Management;Electrical Stimulation;DME Instruction;Gait training;Stair training;Functional mobility training;Therapeutic activities;Therapeutic exercise;Balance training;Neuromuscular re-education;Patient/family education;Orthotic Fit/Training;Energy conservation;Vestibular;Visual/perceptual remediation/compensation   PT Next Visit Plan gait with quad tip cane indoors-doing turns, begin to have her carry items when using cane, adding balance to HEP as able   Consulted and Agree with Plan of Care Patient   Family Member Consulted Husband [name]      Patient will benefit from skilled therapeutic intervention in order to improve the following deficits and impairments:  Decreased activity tolerance, Decreased balance, Decreased coordination, Decreased endurance, Decreased mobility, Decreased strength, Dizziness, Increased edema, Impaired perceived functional ability, Impaired flexibility, Impaired sensation, Impaired UE functional use, Postural dysfunction  Visit Diagnosis: Muscle weakness (generalized)  Unsteadiness on feet  Other abnormalities of gait and mobility     Problem List Patient Active Problem List   Diagnosis Date Noted  . Hematuria   . Urinary retention   . Slow transit constipation   . Yeast infection   . Acute lower UTI   . Other complicated headache syndrome   . Acute blood loss anemia   . Reactive hypertension   .  Multiple sclerosis exacerbation ([place]) 04/24/2016  . Neurogenic bladder 04/24/2016  . Weakness of both arms   . Weakness of both legs   . Sepsis ([place]) 04/21/2016  . Bilateral hydronephrosis 04/21/2016  . ARF (acute renal failure) ([place]) 04/21/2016  . Sepsis secondary to UTI ([place]) 04/20/2016  . Attention deficit disorder 02/23/2016  . Gait disturbance 11/24/2015  . Other fatigue 11/24/2015  . Urinary urgency 11/24/2015  . Dehydration   . UTI (lower urinary tract infection) 10/29/2015  . Leukocytosis 10/29/2015  . Nausea and vomiting 10/29/2015  . MS (multiple sclerosis) ([place]) 10/26/2014    Cameron Sprang, PT, MPT [facility] [address] [place], [place], [address] Phone: [phone]   Fax:  [phone] 06/25/16, 10:42 AM  Name: MAGNOLIA MATTILA MRN: [id number] Date of Birth: 08-22-1960

## 2016-06-25 NOTE — Therapy (Addendum)
[facility] [address] [place], [place], [address] Phone: [phone]   Fax:  [phone]  Occupational Therapy Treatment  Patient Details  Name: Crystal Park MRN: [id number] Date of Birth: [birth year] Referring Provider: Dr. Lona Kettle  Encounter Date: 06/25/2016      OT End of Session - 06/25/16 0815    Visit Number 9   Number of Visits 17   Date for OT Re-Evaluation 07/20/16   Authorization Type [employer], 60 visit limit combined OT/ST, no auth needed   OT Start Time 0806   OT Stop Time 0845   OT Time Calculation (min) 39 min   Activity Tolerance Patient tolerated treatment well   Behavior During Therapy [facility] for tasks assessed/performed      Past Medical History:  Diagnosis Date  . Benign paroxysmal positional vertigo 10/12/2014  . MS (multiple sclerosis) ([place])    diagnosed 9/16    Past Surgical History:  Procedure Laterality Date  . OOPHORECTOMY Left    2013    There were no vitals filed for this visit.      Subjective Assessment - 06/25/16 0814    Subjective  Pt reports tha tshe was able to get in/out of the shower by herself.  Pt reports that she was able to water her plants with squirt bottle by herself.  Pt reports that she has unloaded dishwasher while sitting.   Pertinent History MS diagnosis 9/16 (but some symptoms in 2010)   Limitations urinary catheter, fall risk   Patient Stated Goals bathe herself (including transfer), simple cold/microwave meal prep, fold laundry, feed dog, dress mod I (including getting clothes out)   Currently in Pain? No/denies                      OT Treatments/Exercises (OP) - 06/25/16 0001      Fine Motor Coordination   Fine Motor Coordination Grooved pegs   Grooved pegs placing in pegboard with R hand with min difficulty with in-hand manipulation       Picking up blocks using gripper with R hand for incr sustained grip strength (level 1,  black spring).  Pt needed 1 rest break.    In standing, functional reaching to place large pegs in vertical pegboard with each UE for incr activity tolerance.  Pt able to put in 2 rows in R hand prior to seated rest.  Then 2 rows with LUE.  Standing without UE support for incr balance.    Discussed progress and performance with ADLs/IADLs.  Pt reports that she has been able to pour drinks, feed the dog, fold laundry in sitting, and unload dishwasher in sitting.   Pt reports that she is not confident with the cane.  Wants to be able to carry items when walking with the cane.  Reviewed energy conservation techniques during ADLs/IADLs and pt able to verbalize understanding.  Recommended walker tray and discussed benefit with pt/husband.  Also instructed pt in benefit of reacher for incr safety/energy conservation.  Pt verbalized understanding.              OT Short Term Goals - 06/25/16 0821      OT SHORT TERM GOAL #1   Title Pt will be independent with initial HEP.--check STGs 06/20/16   Time 4   Period Weeks   Status Achieved  06/14/16     OT SHORT TERM GOAL #2   Title Pt will be able to fold laundry in  sitting (with set-up).   Time 4   Period Weeks   Status Achieved  06/14/16     OT SHORT TERM GOAL #3   Title Pt will be able to retrieve clothing prior to dressing mod I.   Time 4   Period Weeks   Status Achieved  06/14/16     OT SHORT TERM GOAL #4   Title Pt will improve bilateral hand coordination for ADLs as shown by improving time on 9-hole peg test by at least 4 sec bilaterally.   Baseline R-34.85sec, L-33.56sec   Time 4   Period Weeks   Status On-going  RUE 33.72 secs LUE 29.75- not fully met     OT SHORT TERM GOAL #5   Title Pt will improve R hand strength by at least 5lbs for incr ease with opening packages/containers.   Baseline R-30lbs   Time 4   Period Weeks   Status Achieved  40 lbs     OT SHORT TERM GOAL #6   Title Pt will verbalize understanding of  energy conservation strategies and AE for incr safety and independence with ADLs/IADLs.   Time 4   Period Weeks   Status Achieved  needs reinforcement.  06/25/16 met           OT Long Term Goals - 06/25/16 0842      OT LONG TERM GOAL #1   Title Pt will be independent with updated HEP.--check LTGs 07/20/16   Time 8   Period Weeks   Status New     OT LONG TERM GOAL #2   Title Pt will perform bathing mod I including shower transfer.   Baseline supervision-min A   Time 8   Period Weeks   Status On-going  06/25/16  has performed once     OT LONG TERM GOAL #3   Title Pt will perform simple cold meal/snack prep and warm items in microwave mod I.   Time 8   Period Weeks   Status New     OT LONG TERM GOAL #4   Title Pt will improve R hand strength by at least 10lbs for incr ease with opening packages/containers.   Baseline 30lbs   Time 8   Period Weeks   Status New     OT LONG TERM GOAL #5   Title Pt will be able to feed dog mod I.   Time 8   Period Weeks   Status Achieved  06/25/16  per pt report     OT LONG TERM GOAL #6   Title Pt will verbalize understanding of memory compensation strategies for ADLs prn.   Time 8   Period Weeks   Status New               Plan - 06/25/16 1610    Clinical Impression Statement Pt is progressing towards goals,  She reports incr independence with ADLs.   Rehab Potential Good   Clinical Impairments Affecting Rehab Potential cognitive/memory deficits   OT Frequency 2x / week   OT Duration 8 weeks   OT Treatment/Interventions Self-care/ADL training;Cryotherapy;Parrafin;Therapeutic exercise;DME and/or AE instruction;Therapist, nutritional;Therapeutic activities;Patient/family education;Balance training;Cognitive remediation/compensation;Manual Therapy;Splinting;Neuromuscular education;Fluidtherapy;Ultrasound;Electrical Stimulation;Moist Heat;Energy conservation;Contrast Bath;Passive range of motion;Therapeutic  exercises;Visual/perceptual remediation/compensation   Plan continue to addres strength and standing balance for ADLs   OT Home Exercise Plan Education provided:  UE strengthening HEP; coordination/red putty HEP 06/07/16   Consulted and Agree with Plan of Care Patient      Patient will benefit from  skilled therapeutic intervention in order to improve the following deficits and impairments:  Decreased balance, Decreased endurance, Decreased mobility, Decreased range of motion, Decreased knowledge of use of DME, Decreased strength, Impaired UE functional use, Impaired flexibility, Decreased activity tolerance, Decreased coordination, Decreased cognition  Visit Diagnosis: Muscle weakness (generalized)  Unsteadiness on feet  Other abnormalities of gait and mobility  Stiffness of right shoulder, not elsewhere classified  Stiffness of left shoulder, not elsewhere classified  Other lack of coordination  Attention and concentration deficit    Problem List Patient Active Problem List   Diagnosis Date Noted  . Hematuria   . Urinary retention   . Slow transit constipation   . Yeast infection   . Acute lower UTI   . Other complicated headache syndrome   . Acute blood loss anemia   . Reactive hypertension   . Multiple sclerosis exacerbation ([place]) 04/24/2016  . Neurogenic bladder 04/24/2016  . Weakness of both arms   . Weakness of both legs   . Sepsis ([place]) 04/21/2016  . Bilateral hydronephrosis 04/21/2016  . ARF (acute renal failure) ([place]) 04/21/2016  . Sepsis secondary to UTI ([place]) 04/20/2016  . Attention deficit disorder 02/23/2016  . Gait disturbance 11/24/2015  . Other fatigue 11/24/2015  . Urinary urgency 11/24/2015  . Dehydration   . UTI (lower urinary tract infection) 10/29/2015  . Leukocytosis 10/29/2015  . Nausea and vomiting 10/29/2015  . MS (multiple sclerosis) ([place]) 10/26/2014    [facility] 06/25/2016, 8:43 AM  [facility] [address] [place], [place], [address] Phone: [phone]   Fax:  [phone]  Name: GABRIELA IRIGOYEN MRN: [id number] Date of Birth: [birth year]   [name], OTR/L [facility] [address] [place], [place]  [address] [phone] phone [phone] 06/25/16 8:43 AM

## 2016-06-28 ENCOUNTER — Ambulatory Visit: Payer: Managed Care, Other (non HMO) | Admitting: Rehabilitation

## 2016-06-28 ENCOUNTER — Encounter: Payer: Self-pay | Admitting: Rehabilitation

## 2016-06-28 ENCOUNTER — Ambulatory Visit: Payer: Managed Care, Other (non HMO) | Admitting: Occupational Therapy

## 2016-06-28 DIAGNOSIS — R278 Other lack of coordination: Secondary | ICD-10-CM

## 2016-06-28 DIAGNOSIS — R2689 Other abnormalities of gait and mobility: Secondary | ICD-10-CM

## 2016-06-28 DIAGNOSIS — M25612 Stiffness of left shoulder, not elsewhere classified: Secondary | ICD-10-CM

## 2016-06-28 DIAGNOSIS — M6281 Muscle weakness (generalized): Secondary | ICD-10-CM

## 2016-06-28 DIAGNOSIS — R2681 Unsteadiness on feet: Secondary | ICD-10-CM

## 2016-06-28 DIAGNOSIS — R41840 Attention and concentration deficit: Secondary | ICD-10-CM

## 2016-06-28 DIAGNOSIS — M25611 Stiffness of right shoulder, not elsewhere classified: Secondary | ICD-10-CM

## 2016-06-28 NOTE — Therapy (Signed)
[facility] [address] [place], [place], [address] Phone: [phone]   Fax:  [phone]  Physical Therapy Treatment  Patient Details  Name: Crystal Park MRN: [id number] Date of Birth: December 27, 1960 Referring Provider: Lawerance Cruel, MD  Encounter Date: 06/28/2016      PT End of Session - 06/28/16 1214    Visit Number 10   Number of Visits 17   Date for PT Re-Evaluation 07/20/16   Authorization Type [employer] (60 visit limit for PT)   Authorization - Visit Number 10   Authorization - Number of Visits 60   PT Start Time [phone]   PT Stop Time 0930   PT Time Calculation (min) 44 min   Activity Tolerance Patient limited by fatigue   Behavior During Therapy [facility] for tasks assessed/performed      Past Medical History:  Diagnosis Date  . Benign paroxysmal positional vertigo 10/12/2014  . MS (multiple sclerosis) ([place])    diagnosed 9/16    Past Surgical History:  Procedure Laterality Date  . OOPHORECTOMY Left    2013    There were no vitals filed for this visit.      Subjective Assessment - 06/28/16 0848    Subjective Reports she had urology tess run yesterday that was painful and she did not sleep well last night.     Limitations House hold activities;Walking   Patient Stated Goals "To regain strength and balance."    Currently in Pain? No/denies                         [facility] Adult PT Treatment/Exercise - 06/28/16 0849      Ambulation/Gait   Ambulation/Gait Yes   Ambulation/Gait Assistance 5: Supervision   Ambulation/Gait Assistance Details Worked on gait with quad tip attachment cane while negotiating around obstacles and tight spaces.  Had ner negotiate in a figure 8 pattern and also around cones that were placed close together x 2 reps of 20' progressing to carrying water bottle in R hand and cane remained in L hand while ambulating around therapy gym including around previously  mentioned obstacles.  Ended with gait while carrying simulated plate of food.  Performed 115' (with single rest break to allow RUE rest).  Note that on second half of lap, pt very fatigued needing increased cues to attend to position of plate.  Feel that in smaller spaces, she would be better able to do this, therefore recommend she practice this at home with very light single object with husband's supervision and carrying plate in kitchen.  Pt verbalized understanding.  Note that she wanted to ambulate out into lobby with cane, however had two instances of LOB and required assist, therefore recommended her change to RW for remainder of walking.    Ambulation Distance (Feet) 115 Feet  x 4 reps   Assistive device --  quad tip cane   Gait Pattern Step-through pattern;Decreased arm swing - left;Decreased arm swing - right;Decreased stride length;Trunk flexed;Narrow base of support   Ambulation Surface Level;Indoor   Stairs Yes   Stairs Assistance 5: Supervision   Stairs Assistance Details (indicate cue type and reason) Performed stairs x 2 sets of 2 reps with B rails during first rep to simulate enter/exit of home and then practiced with single rail and cane to better simulate going up/down stairs inside the home.  Will continue to work on this as we did not get to finish due to  time.    Stair Management Technique Two rails;One rail Right;Alternating pattern;Forwards   Number of Stairs 8  x 2 reps   Height of Stairs 6                PT Education - 06/28/16 0849    Education provided Yes   Education Details working on carrying single light item with S with cane at home and carrying light plate in kitchen with cane with S    Person(s) Educated Patient   Methods Explanation   Comprehension Verbalized understanding          PT Short Term Goals - 06/25/16 2395      PT SHORT TERM GOAL #1   Title Pt will perform initial HEP with mod I using paper handout to maximize progress made in PT.  Target date for all STGs: 06/18/16   Baseline met    Time 4   Period Weeks   Status Achieved     PT SHORT TERM GOAL #2   Title Will assess BERG balance test and improve score by 3 points from baseline in order to indicate decreased fall risk.    Baseline 35/56 baseline, 44/56 on 06/21/16   Time 4   Period Weeks   Status Achieved     PT SHORT TERM GOAL #3   Title Pt will improve 5TSS to </=21 secs with single UE support only in order to indicate improved functional strength.    Baseline 18.25 secs without UE support 06/21/16   Time 4   Period Weeks   Status Achieved     PT SHORT TERM GOAL #4   Title Pt will improve TUG to </=32 secs w/ LRAD in order to indicate decreased fall risk.    Baseline 34.19 secs with RW, 44.07 secs with quad tip cane   Time 4   Period Weeks   Status Partially Met     PT SHORT TERM GOAL #5   Title Pt will improve gait speed to 1.75 ft/sec w/ LRAD in order to indicate decreased fall risk and improved efficiency of gait.    Baseline 1.13 ft/sec with RW on 06/21/16   Time 4   Period Weeks   Status Not Met     PT SHORT TERM GOAL #6   Title Pt will ambulate x 200' w/ LRAD over unlevel paved surfaces at mod I level in order to indicate return to community.    Baseline met 06/25/16   Time 4   Period Weeks   Status Achieved           PT Long Term Goals - 05/21/16 1138      PT LONG TERM GOAL #1   Title Pt will be independent with final HEP in order to indicate improved functional mobility and decreased fall risk.  (Target Date for all LTGs: 07/16/16)   Time 8   Period Weeks   Status New     PT LONG TERM GOAL #2   Title Pt will improve BERG balance score by 6 points from baseline in order to indicate decreased fall risk.     Time 8   Period Weeks   Status New     PT LONG TERM GOAL #3   Title Pt will improve 5TSS to </=17 secs without UE support in order to indicate improved functional mobility.     Time 8   Period Weeks   Status New     PT  LONG TERM GOAL #4  Title Pt will perform TUG in </=28 secs w/ LRAD in order to indicate decreased fall risk.     Time 8   Period Weeks   Status New     PT LONG TERM GOAL #5   Title Pt will improve gait speed to 2.35 ft/sec in order to indicate decreased fall risk and improved efficiency of gait.    Time 8   Period Weeks   Status New     Additional Long Term Goals   Additional Long Term Goals Yes     PT LONG TERM GOAL #6   Title Pt will ambulate up to 150' over indoor surfaces with quad tip cane at S level in order to indicate improved functional independence.     Time 8   Period Weeks   Status New               Plan - 06/28/16 1214    Clinical Impression Statement Skilled session focused on gait with quad tip attachment cane while negotiating around obstacles progressing to carrying objects while using quad tip cane.  Encouraged her to work on this at home with S of husband.     Rehab Potential Good   Clinical Impairments Affecting Rehab Potential Progressive disease process   PT Frequency 2x / week   PT Duration 8 weeks   PT Treatment/Interventions ADLs/Self Care Home Management;Electrical Stimulation;DME Instruction;Gait training;Stair training;Functional mobility training;Therapeutic activities;Therapeutic exercise;Balance training;Neuromuscular re-education;Patient/family education;Orthotic Fit/Training;Energy conservation;Vestibular;Visual/perceptual remediation/compensation   PT Next Visit Plan gait with quad tip cane indoors-doing turns, begin to have her carry items when using cane, adding balance to HEP as able   Consulted and Agree with Plan of Care Patient   Family Member Consulted Husband [name]      Patient will benefit from skilled therapeutic intervention in order to improve the following deficits and impairments:  Decreased activity tolerance, Decreased balance, Decreased coordination, Decreased endurance, Decreased mobility, Decreased strength, Dizziness,  Increased edema, Impaired perceived functional ability, Impaired flexibility, Impaired sensation, Impaired UE functional use, Postural dysfunction  Visit Diagnosis: Muscle weakness (generalized)  Unsteadiness on feet  Other abnormalities of gait and mobility     Problem List Patient Active Problem List   Diagnosis Date Noted  . Hematuria   . Urinary retention   . Slow transit constipation   . Yeast infection   . Acute lower UTI   . Other complicated headache syndrome   . Acute blood loss anemia   . Reactive hypertension   . Multiple sclerosis exacerbation ([place]) 04/24/2016  . Neurogenic bladder 04/24/2016  . Weakness of both arms   . Weakness of both legs   . Sepsis ([place]) 04/21/2016  . Bilateral hydronephrosis 04/21/2016  . ARF (acute renal failure) ([place]) 04/21/2016  . Sepsis secondary to UTI ([place]) 04/20/2016  . Attention deficit disorder 02/23/2016  . Gait disturbance 11/24/2015  . Other fatigue 11/24/2015  . Urinary urgency 11/24/2015  . Dehydration   . UTI (lower urinary tract infection) 10/29/2015  . Leukocytosis 10/29/2015  . Nausea and vomiting 10/29/2015  . MS (multiple sclerosis) ([place]) 10/26/2014    Cameron Sprang, PT, MPT [facility] [address] [place], [place], [address] Phone: [phone]   Fax:  [phone] 06/28/16, 12:16 PM  Name: ANETRIA HARWICK MRN: [id number] Date of Birth: 02-May-1960

## 2016-06-28 NOTE — Therapy (Signed)
[place] [address] [place], [place], [address] Phone: [phone]   Fax:  [phone]  Occupational Therapy Treatment  Patient Details  Name: Crystal Park MRN: [id number] Date of Birth: [birth year] Referring Provider: Dr. Lona Kettle  Encounter Date: 06/28/2016      OT End of Session - 06/28/16 0807    Visit Number 10   Number of Visits 17   Date for OT Re-Evaluation 07/20/16   Authorization Type [employer], 60 visit limit combined OT/ST, no auth needed   OT Start Time 0804   OT Stop Time 0845   OT Time Calculation (min) 41 min   Activity Tolerance Patient tolerated treatment well   Behavior During Therapy [facility] for tasks assessed/performed      Past Medical History:  Diagnosis Date  . Benign paroxysmal positional vertigo 10/12/2014  . MS (multiple sclerosis) ([place])    diagnosed 9/16    Past Surgical History:  Procedure Laterality Date  . OOPHORECTOMY Left    2013    There were no vitals filed for this visit.      Subjective Assessment - 06/28/16 0806    Subjective  Pt reportsthat she had a bladder biopsy yesterday.  Had pain meds last night so feels more weak/tired.   Pertinent History MS diagnosis 9/16 (but some symptoms in 2010)   Limitations urinary catheter, fall risk   Patient Stated Goals bathe herself (including transfer), simple cold/microwave meal prep, fold laundry, feed dog, dress mod I (including getting clothes out)   Currently in Pain? No/denies                      OT Treatments/Exercises (OP) - 06/28/16 0001      Fine Motor Coordination   Fine Motor Coordination Purdue Pegboard   Purdue Pegboard completing with R hand with min difficulty and reports of fatigue       Arm bike x 5 min level 1 for reciprocal movements and conditioning without rest and maintaining approx 25-28rpms.  Picking up blocks using gripper set on level 1 (black spring) for sustained grip  strength with 3 rest breaks (arm bike during last break, after completing approx 2/3).  Pt with mod-max difficulty/drops.   In standing functional reaching from overhead shelf incorporating wt. Shift and reaching outside base of support for incr activity tolerance, and balance for simulated loading/unloading dishwasher.  Pt needed break due to 5/10 dizziness.  More dizziness reported when reaching to the L side than to the R side.                OT Short Term Goals - 06/25/16 0821      OT SHORT TERM GOAL #1   Title Pt will be independent with initial HEP.--check STGs 06/20/16   Time 4   Period Weeks   Status Achieved  06/14/16     OT SHORT TERM GOAL #2   Title Pt will be able to fold laundry in sitting (with set-up).   Time 4   Period Weeks   Status Achieved  06/14/16     OT SHORT TERM GOAL #3   Title Pt will be able to retrieve clothing prior to dressing mod I.   Time 4   Period Weeks   Status Achieved  06/14/16     OT SHORT TERM GOAL #4   Title Pt will improve bilateral hand coordination for ADLs as shown by improving time on 9-hole peg test by at  least 4 sec bilaterally.   Baseline R-34.85sec, L-33.56sec   Time 4   Period Weeks   Status On-going  RUE 33.72 secs LUE 29.75- not fully met     OT SHORT TERM GOAL #5   Title Pt will improve R hand strength by at least 5lbs for incr ease with opening packages/containers.   Baseline R-30lbs   Time 4   Period Weeks   Status Achieved  40 lbs     OT SHORT TERM GOAL #6   Title Pt will verbalize understanding of energy conservation strategies and AE for incr safety and independence with ADLs/IADLs.   Time 4   Period Weeks   Status Achieved  needs reinforcement.  06/25/16 met           OT Long Term Goals - 06/28/16 0810      OT LONG TERM GOAL #1   Title Pt will be independent with updated HEP.--check LTGs 07/20/16   Time 8   Period Weeks   Status New     OT LONG TERM GOAL #2   Title Pt will perform bathing  mod I including shower transfer.   Baseline supervision-min A   Time 8   Period Weeks   Status On-going  06/25/16  has performed once     OT LONG TERM GOAL #3   Title Pt will perform simple cold meal/snack prep and warm items in microwave mod I.   Time 8   Period Weeks   Status On-going  06/28/16:  partially met.  pt is performing cold meal prep     OT LONG TERM GOAL #4   Title Pt will improve R hand strength by at least 15lbs for incr ease with opening packages/containers.  (upgraded 06/28/16 to 15lb improvement)   Baseline 30lbs   Time 8   Period Weeks   Status Revised     OT LONG TERM GOAL #5   Title Pt will be able to feed dog mod I.   Time 8   Period Weeks   Status Achieved  06/25/16  per pt report     OT LONG TERM GOAL #6   Title Pt will verbalize understanding of memory compensation strategies for ADLs prn.   Time 8   Period Weeks   Status New               Plan - 06/28/16 9563    Clinical Impression Statement Pt is progressing towards goals with improving IADL performance and improving coordination.   Rehab Potential Good   Clinical Impairments Affecting Rehab Potential cognitive/memory deficits   OT Frequency 2x / week   OT Duration 8 weeks   OT Treatment/Interventions Self-care/ADL training;Cryotherapy;Parrafin;Therapeutic exercise;DME and/or AE instruction;Therapist, nutritional;Therapeutic activities;Patient/family education;Balance training;Cognitive remediation/compensation;Manual Therapy;Splinting;Neuromuscular education;Fluidtherapy;Ultrasound;Electrical Stimulation;Moist Heat;Energy conservation;Contrast Bath;Passive range of motion;Therapeutic exercises;Visual/perceptual remediation/compensation   Plan continue to address strength, standing balance for ADLs   OT Home Exercise Plan Education provided:  UE strengthening HEP; coordination/red putty HEP 06/07/16   Consulted and Agree with Plan of Care Patient      Patient will benefit from skilled  therapeutic intervention in order to improve the following deficits and impairments:  Decreased balance, Decreased endurance, Decreased mobility, Decreased range of motion, Decreased knowledge of use of DME, Decreased strength, Impaired UE functional use, Impaired flexibility, Decreased activity tolerance, Decreased coordination, Decreased cognition  Visit Diagnosis: Muscle weakness (generalized)  Unsteadiness on feet  Other abnormalities of gait and mobility  Stiffness of right shoulder, not elsewhere classified  Stiffness of left shoulder, not elsewhere classified  Attention and concentration deficit  Other lack of coordination    Problem List Patient Active Problem List   Diagnosis Date Noted  . Hematuria   . Urinary retention   . Slow transit constipation   . Yeast infection   . Acute lower UTI   . Other complicated headache syndrome   . Acute blood loss anemia   . Reactive hypertension   . Multiple sclerosis exacerbation ([place]) 04/24/2016  . Neurogenic bladder 04/24/2016  . Weakness of both arms   . Weakness of both legs   . Sepsis ([place]) 04/21/2016  . Bilateral hydronephrosis 04/21/2016  . ARF (acute renal failure) ([place]) 04/21/2016  . Sepsis secondary to UTI ([place]) 04/20/2016  . Attention deficit disorder 02/23/2016  . Gait disturbance 11/24/2015  . Other fatigue 11/24/2015  . Urinary urgency 11/24/2015  . Dehydration   . UTI (lower urinary tract infection) 10/29/2015  . Leukocytosis 10/29/2015  . Nausea and vomiting 10/29/2015  . MS (multiple sclerosis) ([place]) 10/26/2014    [facility] 06/28/2016, 8:18 AM  [place] [address] [place], [place], [address] Phone: [phone]   Fax:  [phone]  Name: Crystal Park MRN: [id number] Date of Birth: Jul 18, 1960   [name], OTR/L [facility] [address] [place], [place]  [address] [phone]  phone [phone] 06/28/16 8:47 AM

## 2016-07-02 ENCOUNTER — Ambulatory Visit (INDEPENDENT_AMBULATORY_CARE_PROVIDER_SITE_OTHER): Payer: Managed Care, Other (non HMO) | Admitting: Neurology

## 2016-07-02 ENCOUNTER — Encounter: Payer: Self-pay | Admitting: Neurology

## 2016-07-02 VITALS — BP 108/78 | HR 76 | Resp 16 | Ht 62.0 in | Wt 124.5 lb

## 2016-07-02 DIAGNOSIS — R29898 Other symptoms and signs involving the musculoskeletal system: Secondary | ICD-10-CM

## 2016-07-02 DIAGNOSIS — N319 Neuromuscular dysfunction of bladder, unspecified: Secondary | ICD-10-CM | POA: Diagnosis not present

## 2016-07-02 DIAGNOSIS — G35 Multiple sclerosis: Secondary | ICD-10-CM

## 2016-07-02 DIAGNOSIS — R5383 Other fatigue: Secondary | ICD-10-CM

## 2016-07-02 DIAGNOSIS — R269 Unspecified abnormalities of gait and mobility: Secondary | ICD-10-CM | POA: Diagnosis not present

## 2016-07-02 MED ORDER — DIAZEPAM 5 MG PO TABS: 5.0000 mg | ORAL_TABLET | Freq: Two times a day (BID) | ORAL | 5 refills | Status: DC | PRN

## 2016-07-02 NOTE — Progress Notes (Signed)
='''''''''''''''''''''''''''''''''''''''''''''''''''''''''''''''''''''''''''''''''''''''''''''''''''''''''''''''''''''''''''''''''''''''''''''''''''''''''''''''''''''''''''''''''''''''''''''''''''''''''''''''''''''''''''''''''''''''''''''''''''''''''''''''''''''''''''''''''''''''''''''''''''''''''''''''''''''''''''''''''''''''''''''''''''''''''''''''''''''''''''''''''''''''''''''''''''''''''''''''''''''''''''''''''''''''''''''''''''''''''''''''''''''''''''''''''''''''''''''''''''''''''''''''''''''''''''''''''''''''''''''''''''''''''''''''''''''''''''''''''''''''''''''''pomj.   [facility]  PATIENT: Crystal Park DOB: [birth year]  REFERRING DOCTOR OR PCP:  Narda Bonds (ENT referred)   [name] (PCP) SOURCE: paitent and records from Dr. Ezzard Standing.    CT images on PACS  _________________________________   HISTORICAL  CHIEF COMPLAINT:  Chief Complaint  Patient presents with  . Multiple Sclerosis    Sts. she continues to toelrate Tecfidera well.  Sts. vertigo is some improved with Valium.  She continues PT.  Sts. she couldn't tolerate Adderall (caused excessive fatigue) so she stopped it  Sts. she has stopped Myrbetriq--is seeing Urology for this now. Currently has an indwelling foley cath/fim    HISTORY OF PRESENT ILLNESS:  Crystal Park is a 56 year old woman with multiple sclerosis.      In February, she went to the ED when she presented with weakness, worsening balance.  She was found to have a bad UTI which was treated.    She was felt to be retaining fluids.  She was admitted and did inpt Rehab and is doing outpt rehab now.   Urodynamics was performed and Myrbetriq was d/c.   She has an indwelling catheter being changed monthly.    She goes back for UD results in a week or two.   A suprapubic cath was also discussed with them.    MS:   She is on Tecfidera tolerates it well. She has not had any definite exacerbation.  Studies:   The brain and spine MRIs did  not show any new lesions in Feb 2018.    Gait/strength/sensation:  She was using a walker but is back to using a cane now..  No falls in last 2 weeks but she fell down a few times in March without injury.   She notes right weakness and clumsiness.     Ampyra never helped and she stopped.     Vertigo:   She still has vertigo, but it is much better with valium.    In the past, she saw her ENT and had ENG testing done that was reportedly consistent with central vertigo.  Austin Miles and other exercises helped minimally.   Valium helps some.  Vision:   She denies any difficulty with her vision.   Bladder:   She is now seeing urology (see above)    Fatigue/sleep:   She has fatigue and tried Amantadine with some benefit but it caused edema and she stopped.   Adderall was not well tolerated.    She does not sleep well as she wakes up easily and she needs to use the bathroom.  Mood/Cognitive:   She denies any depression or anxiety.   She has not noted any major cognitive issues  MS History:   About 2010, she had an event with vertigo and diplopia that took 6 months to improve.  After that she began to have issues with her legs.   I have reviewed the MRI of the brain and the spinal cord.   The MRI of the spine shows several T2 weighted lesions, some peripherally located consistent with multiple sclerosis. None of the foci enhanced. The MRI of the brain shows multiple foci in the periventricular, juxtacortical cortical and deep white matter consistent with multiple sclerosis.   There is also a left cerebellar focus that could represent remote stroke (it is also present on CT scan from 2010)    REVIEW OF SYSTEMS: Constitutional: No fevers, chills, sweats, or change in appetite.   She notes fatigue  Eyes: No visual changes, double vision, eye pain Ear, nose and throat: No hearing loss, ear pain, nasal congestion, sore throat.   Has vertigo Cardiovascular: No chest pain, palpitations Respiratory: No  shortness of breath at rest or with exertion.   No wheezes GastrointestinaI: No nausea, vomiting, diarrhea, abdominal pain, fecal incontinence Genitourinary: As above. No UTIs. Musculoskeletal: No neck pain, back pain Integumentary: No rash, pruritus, skin lesions Neurological: as above Psychiatric: No depression at this time.  Mild anxiety Endocrine: No palpitations, diaphoresis, change in appetite, change in weigh or increased thirst Hematologic/Lymphatic: No anemia, purpura, petechiae. Allergic/Immunologic: No itchy/runny eyes, nasal congestion, recent allergic reactions, rashes  ALLERGIES: No Known Allergies  HOME MEDICATIONS:  Current Outpatient Prescriptions:  .  cholecalciferol (VITAMIN D) 1000 UNITS tablet, Take 1,000 Units by mouth daily. otc vit. d 5,000iu daily/fim, Disp: , Rfl:  .  CRANBERRY PO, Take 1 capsule by mouth daily., Disp: , Rfl:  .  diazepam (VALIUM) 5 MG tablet, Take 1 tablet (5 mg total) by mouth every 12 (twelve) hours as needed for anxiety (for dizziness also)., Disp: 60 tablet, Rfl: 5 .  TECFIDERA 240 MG CPDR, TAKE 1 CAPSULE BY MOUTH TWICE DAILY, Disp: 60 capsule, Rfl: 10 .  gabapentin (NEURONTIN) 100 MG capsule, Take 2 capsules (200 mg total) by mouth 3 (three) times daily as needed. (Patient not taking: Reported on 07/02/2016), Disp: 180 capsule, Rfl: 1 .  HYDROcodone-acetaminophen (NORCO/VICODIN) 5-325 MG tablet, Take 1 tablet by mouth every 6 (six) hours as needed for moderate pain. (Patient not taking: Reported on 05/21/2016), Disp: 20 tablet, Rfl: 0 .  ondansetron (ZOFRAN) 4 MG tablet, Take 1 tablet (4 mg total) by mouth every 6 (six) hours as needed for nausea. (Patient not taking: Reported on 06/04/2016), Disp: 30 tablet, Rfl: 0 .  polyethylene glycol (MIRALAX / GLYCOLAX) packet, Take 17 g by mouth daily. (Patient not taking: Reported on 07/02/2016), Disp: 14 each, Rfl: 0 No current facility-administered medications for this visit.   Facility-Administered  Medications Ordered in Other Visits:  .  gadopentetate dimeglumine (MAGNEVIST) injection 11 mL, 11 mL, Intravenous, Once PRN, Asa Lente, MD .  gadopentetate dimeglumine (MAGNEVIST) injection 12 mL, 12 mL, Intravenous, Once PRN, Asa Lente, MD  PAST MEDICAL HISTORY: Past Medical History:  Diagnosis Date  . Benign paroxysmal positional vertigo 10/12/2014  . MS (multiple sclerosis) ([facility])    diagnosed 9/16    PAST SURGICAL HISTORY: Past Surgical History:  Procedure Laterality Date  . OOPHORECTOMY Left    2013    FAMILY HISTORY: No family history on file.  SOCIAL HISTORY:  Social History   Social History  . Marital status: Married    Spouse name: N/A  . Number of children: N/A  . Years of education: N/A   Occupational History  . Not on file.   Social History Main Topics  . Smoking status: Never Smoker  . Smokeless tobacco: Never Used  . Alcohol use No  . Drug use: No  . Sexual activity: Not Currently   Other Topics Concern  . Not on file   Social History Narrative  . No narrative on file     PHYSICAL EXAM  Vitals:   07/02/16 0840  BP: 108/78  Pulse: 76  Resp: 16  Weight: 124 lb 8 oz (56.5 kg)  Height: 5\' 2"  (1.575 m)    Body mass index is 22.77 kg/m.   General: The patient is well-developed and well-nourished and in no acute distress.  Hearing is symmetric.   Right > left ankle edema.  Neurologic Exam  Mental status: The patient is alert and oriented x 3 at the time of the examination. The patient has apparent normal recent and remote memory, with an apparently normal attention span and concentration ability.   Speech is normal.  Cranial nerves: Extraocular movements are full.   There is good facial sensation to soft touch bilaterally.Facial strength is normal.  Trapezius and sternocleidomastoid strength is normal. No dysarthria is noted.    No obvious hearing deficits are noted.  Motor:  Muscle bulk is normal.   Tone is normal. Strength  is  5 / 5 in the arms and 4+/5 in left leg and 4-/5 in right leg.   Sensory: Sensory testing is intact to touch and vibration sensation in the arms. She has reduced vibration sensation at ankles.  Coordination: Cerebellar testing reveals good finger-nose-finger and reduced heel-to-shin bilaterally, worse on right.  Gait and station: Station is normal.   Gait requires a cane and she has mild right foot drop.  Romberg is negative.   Reflexes: Deep tendon reflexes are symmetric and 3+ bilaterally  and increased in the knees (with spread).  No ankle clonus.Marland Kitchen       DIAGNOSTIC DATA (LABS, IMAGING, TESTING) - I reviewed patient records, labs, notes, testing and imaging myself where available.     ASSESSMENT AND PLAN  MS (multiple sclerosis) ([facility])  Weakness of both legs  Gait disturbance  Neurogenic bladder  Other fatigue   1.   Continue tecfidera.     2.    She will continue outpt PT and see urology.    3.   Continue valium for Vertigo.    4.   Stay active and exercises tolerated.    5.   I will see her back 5 months or sooner if she has new or worsening symptoms.  45 minutes face-to-face evaluation with greater than one half of the time counseling and coordinating care about her recent pseudo-exacerbation and her MS symptoms.   Itza Maniaci A. Epimenio Foot, MD, PhD 07/02/2016, 9:09 AM Certified in Neurology, Clinical Neurophysiology, Sleep Medicine, Pain Medicine and Neuroimaging  [facility] [address], [address] [place], [place] [address] [phone]

## 2016-07-04 ENCOUNTER — Ambulatory Visit: Payer: Managed Care, Other (non HMO) | Attending: Family Medicine | Admitting: Occupational Therapy

## 2016-07-04 ENCOUNTER — Encounter: Payer: Self-pay | Admitting: Physical Therapy

## 2016-07-04 ENCOUNTER — Ambulatory Visit: Payer: Managed Care, Other (non HMO) | Admitting: Physical Therapy

## 2016-07-04 DIAGNOSIS — M6281 Muscle weakness (generalized): Secondary | ICD-10-CM | POA: Insufficient documentation

## 2016-07-04 DIAGNOSIS — R2681 Unsteadiness on feet: Secondary | ICD-10-CM | POA: Diagnosis present

## 2016-07-04 DIAGNOSIS — M25612 Stiffness of left shoulder, not elsewhere classified: Secondary | ICD-10-CM | POA: Insufficient documentation

## 2016-07-04 DIAGNOSIS — R2689 Other abnormalities of gait and mobility: Secondary | ICD-10-CM

## 2016-07-04 DIAGNOSIS — R29898 Other symptoms and signs involving the musculoskeletal system: Secondary | ICD-10-CM | POA: Insufficient documentation

## 2016-07-04 DIAGNOSIS — M25611 Stiffness of right shoulder, not elsewhere classified: Secondary | ICD-10-CM | POA: Insufficient documentation

## 2016-07-04 DIAGNOSIS — R278 Other lack of coordination: Secondary | ICD-10-CM | POA: Diagnosis present

## 2016-07-04 DIAGNOSIS — R41840 Attention and concentration deficit: Secondary | ICD-10-CM | POA: Diagnosis present

## 2016-07-04 NOTE — Therapy (Signed)
[facility] [address] [place], [place], [address] Phone: [phone]   Fax:  [phone]  Physical Therapy Treatment  Patient Details  Name: Crystal Park MRN: [id number] Date of Birth: 1961/01/06 Referring Provider: Lawerance Cruel, MD  Encounter Date: 07/04/2016      PT End of Session - 07/04/16 0850    Visit Number 11   Number of Visits 17   Date for PT Re-Evaluation 07/20/16   Authorization Type [employer] (60 visit limit for PT)   Authorization - Visit Number 11   Authorization - Number of Visits 60   PT Start Time 0805   PT Stop Time 0848   PT Time Calculation (min) 43 min   Activity Tolerance Patient limited by fatigue   Behavior During Therapy [facility] for tasks assessed/performed      Past Medical History:  Diagnosis Date  . Benign paroxysmal positional vertigo 10/12/2014  . MS (multiple sclerosis) ([place])    diagnosed 9/16    Past Surgical History:  Procedure Laterality Date  . OOPHORECTOMY Left    2013    There were no vitals filed for this visit.      Subjective Assessment - 07/04/16 0808    Subjective Pt reports using cane at home and today being the first day using cane out in the community; feeling nervous.  No problems with using cane at home.  Still having significant dizziness, medication not resolving dizziness.   Patient is accompained by: Family member   Limitations House hold activities;Walking   Patient Stated Goals "To regain strength and balance."    Currently in Pain? No/denies                Vestibular Assessment - 07/04/16 0810      Symptom Behavior   Type of Dizziness Imbalance  when not on Valium, feels like the world is "shifting"   Frequency of Dizziness daily   Duration of Dizziness feels it when she awakens, takes medication and disequilibrium eases off until medication wears off   Aggravating Factors Turning body quickly;Turning head quickly  reaching down  to floor   Relieving Factors Medication     Occulomotor Exam   Occulomotor Alignment Normal   Spontaneous Absent   Gaze-induced Left beating nystagmus with L gaze   Smooth Pursuits Saccades   Saccades Poor trajectory;Slow  over shooting   Comment Convergence WFL, reports difficulty with reading     Vestibulo-Occular Reflex   VOR 1 Head Only (x 1 viewing) pt feels symptomatic   VOR Cancellation Corrective saccades   Comment HIT: + to R and + to L with R beating nystagmus     Positional Sensitivities   Nose to Right Knee Mild dizziness   Right Knee to Sitting Severe dizziness   Nose to Left Knee Moderate dizziness   Left Knee to Sitting Severe dizziness   Head Turning x 5 Mild dizziness   Head Nodding x 5 Severe dizziness   Pivot Right in Standing Severe dizziness   Pivot Left in Standing Severe dizziness                  Vestibular Treatment/Exercise - 07/04/16 7782      Vestibular Treatment/Exercise   Vestibular Treatment Provided Habituation;Gaze   Gaze Exercises X1 Viewing Horizontal;X1 Viewing Vertical;Eye/Head Exercise Horizontal;Eye/Head Exercise Vertical     X1 Viewing Horizontal   Foot Position seated   Reps 1   Comments 30 seconds, fatigued afterwards  X1 Viewing Vertical   Foot Position seated   Reps 1   Comments 30 seconds without glasses; reports nausea     Eye/Head Exercise Horizontal   Foot Position seated   Reps 10   Comments very slow head movements     Eye/Head Exercise Vertical   Foot Position seated   Reps 10               PT Education - 07/04/16 0847    Education provided Yes   Education Details Reviewed vestibular exercises from past episode of care and adjusted   Person(s) Educated Patient   Methods Explanation;Demonstration;Handout   Comprehension Verbalized understanding      1. Holding two stationary targets placed _12___ inches apart, move eyes to target, keep head still. 2. Then move head in direction of  target while eyes remain on target. 3/4. Repeat in opposite direction. Perform sitting. Repeat sequence _10___ times side to side and then up and down. Do _1___ sessions per day.  Copyright  VHI. All rights reserved.       PT Short Term Goals - 06/25/16 6623      PT SHORT TERM GOAL #1   Title Pt will perform initial HEP with mod I using paper handout to maximize progress made in PT. Target date for all STGs: 06/18/16   Baseline met    Time 4   Period Weeks   Status Achieved     PT SHORT TERM GOAL #2   Title Will assess BERG balance test and improve score by 3 points from baseline in order to indicate decreased fall risk.    Baseline 35/56 baseline, 44/56 on 06/21/16   Time 4   Period Weeks   Status Achieved     PT SHORT TERM GOAL #3   Title Pt will improve 5TSS to </=21 secs with single UE support only in order to indicate improved functional strength.    Baseline 18.25 secs without UE support 06/21/16   Time 4   Period Weeks   Status Achieved     PT SHORT TERM GOAL #4   Title Pt will improve TUG to </=32 secs w/ LRAD in order to indicate decreased fall risk.    Baseline 34.19 secs with RW, 44.07 secs with quad tip cane   Time 4   Period Weeks   Status Partially Met     PT SHORT TERM GOAL #5   Title Pt will improve gait speed to 1.75 ft/sec w/ LRAD in order to indicate decreased fall risk and improved efficiency of gait.    Baseline 1.13 ft/sec with RW on 06/21/16   Time 4   Period Weeks   Status Not Met     PT SHORT TERM GOAL #6   Title Pt will ambulate x 200' w/ LRAD over unlevel paved surfaces at mod I level in order to indicate return to community.    Baseline met 06/25/16   Time 4   Period Weeks   Status Achieved           PT Long Term Goals - 05/21/16 1138      PT LONG TERM GOAL #1   Title Pt will be independent with final HEP in order to indicate improved functional mobility and decreased fall risk.  (Target Date for all LTGs: 07/16/16)   Time 8    Period Weeks   Status New     PT LONG TERM GOAL #2   Title Pt will improve BERG balance  score by 6 points from baseline in order to indicate decreased fall risk.     Time 8   Period Weeks   Status New     PT LONG TERM GOAL #3   Title Pt will improve 5TSS to </=17 secs without UE support in order to indicate improved functional mobility.     Time 8   Period Weeks   Status New     PT LONG TERM GOAL #4   Title Pt will perform TUG in </=28 secs w/ LRAD in order to indicate decreased fall risk.     Time 8   Period Weeks   Status New     PT LONG TERM GOAL #5   Title Pt will improve gait speed to 2.35 ft/sec in order to indicate decreased fall risk and improved efficiency of gait.    Time 8   Period Weeks   Status New     Additional Long Term Goals   Additional Long Term Goals Yes     PT LONG TERM GOAL #6   Title Pt will ambulate up to 150' over indoor surfaces with quad tip cane at S level in order to indicate improved functional independence.     Time 8   Period Weeks   Status New               Plan - 07/04/16 6754    Clinical Impression Statement Pt able to ambulate into treatment area from waiting area with cane with supervision and no LOB but with very slowed gait.  Due to pt continued reports of dizziness and disequilibrium with specific movements, performed re-assessment of oculomotor and vestibular systems.  Pt continues to present with impaired saccades, eye coordination, gaze stability/VOR and visual-vestibular interactions and significant motion sensitivity consistent with central vertigo.  Reviewed x 1 viewing HEP and upgraded exercises for home and then provided pt with corrective saccades.  Pt required multiple rest breaks to allow symptoms to settle.  Pt felt that corrective saccade exercise will be very helpful and beneficial.     Rehab Potential Good   Clinical Impairments Affecting Rehab Potential Progressive disease process   PT Treatment/Interventions  ADLs/Self Care Home Management;Electrical Stimulation;DME Instruction;Gait training;Stair training;Functional mobility training;Therapeutic activities;Therapeutic exercise;Balance training;Neuromuscular re-education;Patient/family education;Orthotic Fit/Training;Energy conservation;Vestibular;Visual/perceptual remediation/compensation   PT Next Visit Plan Incorporate x 1 viewing, corrective saccades, and habituation to balance exercises (reaching down to floor from seated); gait with quad tip cane indoors-doing turns, begin to have her carry items when using cane, adding balance to HEP as able   Consulted and Agree with Plan of Care Patient      Patient will benefit from skilled therapeutic intervention in order to improve the following deficits and impairments:  Decreased activity tolerance, Decreased balance, Decreased coordination, Decreased endurance, Decreased mobility, Decreased strength, Dizziness, Increased edema, Impaired perceived functional ability, Impaired flexibility, Impaired sensation, Impaired UE functional use, Postural dysfunction  Visit Diagnosis: Muscle weakness (generalized)  Unsteadiness on feet  Other abnormalities of gait and mobility     Problem List Patient Active Problem List   Diagnosis Date Noted  . Hematuria   . Urinary retention   . Slow transit constipation   . Yeast infection   . Acute lower UTI   . Other complicated headache syndrome   . Acute blood loss anemia   . Reactive hypertension   . Multiple sclerosis exacerbation ([place]) 04/24/2016  . Neurogenic bladder 04/24/2016  . Weakness of both arms   . Weakness of both  legs   . Sepsis ([place]) 04/21/2016  . Bilateral hydronephrosis 04/21/2016  . ARF (acute renal failure) ([place]) 04/21/2016  . Sepsis secondary to UTI ([place]) 04/20/2016  . Attention deficit disorder 02/23/2016  . Gait disturbance 11/24/2015  . Other fatigue 11/24/2015  . Urinary urgency 11/24/2015  . Dehydration   . UTI (lower urinary  tract infection) 10/29/2015  . Leukocytosis 10/29/2015  . Nausea and vomiting 10/29/2015  . MS (multiple sclerosis) ([place]) 10/26/2014   Raylene Everts, PT, DPT 07/04/16    8:58 AM     [facility] [address] [place], [place], [address] Phone: [phone]   Fax:  [phone]  Name: Crystal Park MRN: [id number] Date of Birth: March 16, 1960

## 2016-07-04 NOTE — Therapy (Signed)
[facility] [address] [place], [place], [address] Phone: [phone]   Fax:  [phone]  Occupational Therapy Treatment  Patient Details  Name: Crystal Park MRN: [id number] Date of Birth: Mar 14, 1960 Referring Provider: Dr. Lona Kettle  Encounter Date: 07/04/2016      OT End of Session - 07/04/16 0908    Visit Number 10   Number of Visits 17   Date for OT Re-Evaluation 07/20/16   Authorization Type [employer], 60 visit limit combined OT/ST, no auth needed   OT Start Time 0850   OT Stop Time 0930   OT Time Calculation (min) 40 min   Activity Tolerance Patient tolerated treatment well   Behavior During Therapy [facility] for tasks assessed/performed      Past Medical History:  Diagnosis Date  . Benign paroxysmal positional vertigo 10/12/2014  . MS (multiple sclerosis) ([place])    diagnosed 9/16    Past Surgical History:  Procedure Laterality Date  . OOPHORECTOMY Left    2013    There were no vitals filed for this visit.      Subjective Assessment - 07/04/16 0920    Subjective  Denies pain   Pertinent History MS diagnosis 9/16 (but some symptoms in 2010)   Limitations urinary catheter, fall risk   Patient Stated Goals bathe herself (including transfer), simple cold/microwave meal prep, fold laundry, feed dog, dress mod I (including getting clothes out)   Currently in Pain? No/denies          Arm bike x 5 min level 1 for reciprocal movements and conditioning without rest and maintaining approx 30-35rpms.  Picking up blocks using gripper set on level 1 (black spring) for sustained grip strength with 2 rest break  Pt with mod difficulty/drops.   In standing functional reaching from to lower surface to retrieve clothespins and place on vertical antennae,  incorporating wt. Shift and reaching outside base of support for incr activity tolerance, and balance for simulated IADLS  Pt needed break due to 8/10  dizziness.  More dizziness reported when reaching to the L side than to the R side.                          OT Short Term Goals - 06/25/16 0821      OT SHORT TERM GOAL #1   Title Pt will be independent with initial HEP.--check STGs 06/20/16   Time 4   Period Weeks   Status Achieved  06/14/16     OT SHORT TERM GOAL #2   Title Pt will be able to fold laundry in sitting (with set-up).   Time 4   Period Weeks   Status Achieved  06/14/16     OT SHORT TERM GOAL #3   Title Pt will be able to retrieve clothing prior to dressing mod I.   Time 4   Period Weeks   Status Achieved  06/14/16     OT SHORT TERM GOAL #4   Title Pt will improve bilateral hand coordination for ADLs as shown by improving time on 9-hole peg test by at least 4 sec bilaterally.   Baseline R-34.85sec, L-33.56sec   Time 4   Period Weeks   Status On-going  RUE 33.72 secs LUE 29.75- not fully met     OT SHORT TERM GOAL #5   Title Pt will improve R hand strength by at least 5lbs for incr ease with opening packages/containers.  Baseline R-30lbs   Time 4   Period Weeks   Status Achieved  40 lbs     OT SHORT TERM GOAL #6   Title Pt will verbalize understanding of energy conservation strategies and AE for incr safety and independence with ADLs/IADLs.   Time 4   Period Weeks   Status Achieved  needs reinforcement.  06/25/16 met           OT Long Term Goals - 06/28/16 0810      OT LONG TERM GOAL #1   Title Pt will be independent with updated HEP.--check LTGs 07/20/16   Time 8   Period Weeks   Status New     OT LONG TERM GOAL #2   Title Pt will perform bathing mod I including shower transfer.   Baseline supervision-min A   Time 8   Period Weeks   Status On-going  06/25/16  has performed once     OT LONG TERM GOAL #3   Title Pt will perform simple cold meal/snack prep and warm items in microwave mod I.   Time 8   Period Weeks   Status On-going  06/28/16:  partially met.  pt is  performing cold meal prep     OT LONG TERM GOAL #4   Title Pt will improve R hand strength by at least 15lbs for incr ease with opening packages/containers.  (upgraded 06/28/16 to 15lb improvement)   Baseline 30lbs   Time 8   Period Weeks   Status Revised     OT LONG TERM GOAL #5   Title Pt will be able to feed dog mod I.   Time 8   Period Weeks   Status Achieved  06/25/16  per pt report     OT LONG TERM GOAL #6   Title Pt will verbalize understanding of memory compensation strategies for ADLs prn.   Time 8   Period Weeks   Status New             Patient will benefit from skilled therapeutic intervention in order to improve the following deficits and impairments:     Visit Diagnosis: Muscle weakness (generalized)  Unsteadiness on feet  Stiffness of right shoulder, not elsewhere classified  Stiffness of left shoulder, not elsewhere classified  Attention and concentration deficit  Other abnormalities of gait and mobility    Problem List Patient Active Problem List   Diagnosis Date Noted  . Hematuria   . Urinary retention   . Slow transit constipation   . Yeast infection   . Acute lower UTI   . Other complicated headache syndrome   . Acute blood loss anemia   . Reactive hypertension   . Multiple sclerosis exacerbation ([place]) 04/24/2016  . Neurogenic bladder 04/24/2016  . Weakness of both arms   . Weakness of both legs   . Sepsis ([place]) 04/21/2016  . Bilateral hydronephrosis 04/21/2016  . ARF (acute renal failure) ([place]) 04/21/2016  . Sepsis secondary to UTI ([place]) 04/20/2016  . Attention deficit disorder 02/23/2016  . Gait disturbance 11/24/2015  . Other fatigue 11/24/2015  . Urinary urgency 11/24/2015  . Dehydration   . UTI (lower urinary tract infection) 10/29/2015  . Leukocytosis 10/29/2015  . Nausea and vomiting 10/29/2015  . MS (multiple sclerosis) ([place]) 10/26/2014    Akshith Moncus 07/04/2016, 9:21 AM  [facility] [address] [place], [place], [address] Phone: [phone]   Fax:  [phone]  Name: Crystal Park MRN:  [id number] Date of Birth: 1960-08-29

## 2016-07-04 NOTE — Patient Instructions (Addendum)
Compensatory Strategies: Corrective Saccades    1. Holding two stationary targets placed _12___ inches apart, move eyes to target, keep head still. 2. Then move head in direction of target while eyes remain on target. 3/4. Repeat in opposite direction. Perform sitting. Repeat sequence _10___ times side to side and then up and down. Do _1___ sessions per day.  Copyright  VHI. All rights reserved.

## 2016-07-05 ENCOUNTER — Encounter: Payer: Managed Care, Other (non HMO) | Admitting: Physical Medicine & Rehabilitation

## 2016-07-09 ENCOUNTER — Ambulatory Visit: Payer: Managed Care, Other (non HMO) | Admitting: Occupational Therapy

## 2016-07-09 ENCOUNTER — Encounter: Payer: Self-pay | Admitting: Rehabilitation

## 2016-07-09 ENCOUNTER — Ambulatory Visit: Payer: Managed Care, Other (non HMO) | Admitting: Rehabilitation

## 2016-07-09 DIAGNOSIS — R2681 Unsteadiness on feet: Secondary | ICD-10-CM

## 2016-07-09 DIAGNOSIS — R41840 Attention and concentration deficit: Secondary | ICD-10-CM

## 2016-07-09 DIAGNOSIS — M6281 Muscle weakness (generalized): Secondary | ICD-10-CM

## 2016-07-09 DIAGNOSIS — M25612 Stiffness of left shoulder, not elsewhere classified: Secondary | ICD-10-CM

## 2016-07-09 DIAGNOSIS — M25611 Stiffness of right shoulder, not elsewhere classified: Secondary | ICD-10-CM

## 2016-07-09 DIAGNOSIS — R2689 Other abnormalities of gait and mobility: Secondary | ICD-10-CM

## 2016-07-09 DIAGNOSIS — R278 Other lack of coordination: Secondary | ICD-10-CM

## 2016-07-09 NOTE — Therapy (Signed)
[facility] [address] [place], [place], [address] Phone: [phone]   Fax:  [phone]  Occupational Therapy Treatment  Patient Details  Name: Crystal Park MRN: [id number] Date of Birth: 1960-06-30 Referring Provider: Dr. Lona Kettle  Encounter Date: 07/09/2016      OT End of Session - 07/09/16 0813    Visit Number 11   Number of Visits 17   Date for OT Re-Evaluation 07/20/16   Authorization Type [employer], 60 visit limit combined OT/ST, no auth needed   OT Start Time 0804   OT Stop Time 0845   OT Time Calculation (min) 41 min   Activity Tolerance Patient tolerated treatment well   Behavior During Therapy [facility] for tasks assessed/performed      Past Medical History:  Diagnosis Date  . Benign paroxysmal positional vertigo 10/12/2014  . MS (multiple sclerosis) ([place])    diagnosed 9/16    Past Surgical History:  Procedure Laterality Date  . OOPHORECTOMY Left    2013    There were no vitals filed for this visit.      Subjective Assessment - 07/09/16 0807    Subjective  Pt is trying to use only the cane at home   Pertinent History MS diagnosis 9/16 (but some symptoms in 2010)   Limitations urinary catheter, fall risk   Patient Stated Goals bathe herself (including transfer), simple cold/microwave meal prep, fold laundry, feed dog, dress mod I (including getting clothes out)   Currently in Pain? No/denies        In supine, closed chain shoulder flex with BUEs for chest press and shoulder flex to 90* with 2lb wt x2sets of 10.  Then with 1lb wt. In [place], performed diagonals to each side x10.   Picking up blocks using gripper with black spring set on level 1 for sustained grip strength.  Min difficulty with R hand, no rest breaks.  In sitting, R wrist flex/ext with 2lb wt x2 sets of 10.  Elbow flex/extension with 2lb wt x2sets of 10 with each UE.  Placing small pegs in pegboard to copy design for  visual/cognitive component with min cues/difficulty for omission.  Pt with min difficulty/incr time with R hand coordination.   Discussed progress towards goals.--see below.                 OT Education - 07/09/16 0835    Education Details Memory Compensation Strategies   Person(s) Educated Patient   Methods Explanation;Handout   Comprehension Verbalized understanding          OT Short Term Goals - 06/25/16 0821      OT SHORT TERM GOAL #1   Title Pt will be independent with initial HEP.--check STGs 06/20/16   Time 4   Period Weeks   Status Achieved  06/14/16     OT SHORT TERM GOAL #2   Title Pt will be able to fold laundry in sitting (with set-up).   Time 4   Period Weeks   Status Achieved  06/14/16     OT SHORT TERM GOAL #3   Title Pt will be able to retrieve clothing prior to dressing mod I.   Time 4   Period Weeks   Status Achieved  06/14/16     OT SHORT TERM GOAL #4   Title Pt will improve bilateral hand coordination for ADLs as shown by improving time on 9-hole peg test by at least 4 sec bilaterally.   Baseline R-34.85sec, L-33.56sec  Time 4   Period Weeks   Status On-going  RUE 33.72 secs LUE 29.75- not fully met     OT SHORT TERM GOAL #5   Title Pt will improve R hand strength by at least 5lbs for incr ease with opening packages/containers.   Baseline R-30lbs   Time 4   Period Weeks   Status Achieved  40 lbs     OT SHORT TERM GOAL #6   Title Pt will verbalize understanding of energy conservation strategies and AE for incr safety and independence with ADLs/IADLs.   Time 4   Period Weeks   Status Achieved  needs reinforcement.  06/25/16 met           OT Long Term Goals - 07/09/16 0823      OT LONG TERM GOAL #1   Title Pt will be independent with updated HEP.--check LTGs 07/20/16   Time 8   Period Weeks   Status New     OT LONG TERM GOAL #2   Title Pt will perform bathing mod I including shower transfer.   Baseline supervision-min  A   Time 8   Period Weeks   Status Achieved  06/25/16  has performed once.  07/09/16  distant supervision-mod I     OT LONG TERM GOAL #3   Title Pt will perform simple cold meal/snack prep and warm items in microwave mod I.   Time 8   Period Weeks   Status On-going  06/28/16:  partially met.  pt is performing cold meal prep     OT LONG TERM GOAL #4   Title Pt will improve R hand strength by at least 15lbs for incr ease with opening packages/containers.  (upgraded 06/28/16 to 15lb improvement)   Baseline 30lbs   Time 8   Period Weeks   Status Revised     OT LONG TERM GOAL #5   Title Pt will be able to feed dog mod I.   Time 8   Period Weeks   Status Achieved  06/25/16  per pt report     OT LONG TERM GOAL #6   Title Pt will verbalize understanding of memory compensation strategies for ADLs prn.   Time 8   Period Weeks   Status New               Plan - 07/09/16 0814    Clinical Impression Statement Pt continues to progress towards goals with improving strength, functional mobility, and coordination.   Rehab Potential Good   Clinical Impairments Affecting Rehab Potential cognitive/memory deficits   OT Frequency 2x / week   OT Duration 8 weeks   OT Treatment/Interventions Self-care/ADL training;Cryotherapy;Parrafin;Therapeutic exercise;DME and/or AE instruction;Therapist, nutritional;Therapeutic activities;Patient/family education;Balance training;Cognitive remediation/compensation;Manual Therapy;Splinting;Neuromuscular education;Fluidtherapy;Ultrasound;Electrical Stimulation;Moist Heat;Energy conservation;Contrast Bath;Passive range of motion;Therapeutic exercises;Visual/perceptual remediation/compensation   Plan continue to address strength, standing balance for ADLs   OT Home Exercise Plan Education provided:  UE strengthening HEP; coordination/red putty HEP 06/07/16   Consulted and Agree with Plan of Care Patient      Patient will benefit from skilled therapeutic  intervention in order to improve the following deficits and impairments:  Decreased balance, Decreased endurance, Decreased mobility, Decreased range of motion, Decreased knowledge of use of DME, Decreased strength, Impaired UE functional use, Impaired flexibility, Decreased activity tolerance, Decreased coordination, Decreased cognition  Visit Diagnosis: Muscle weakness (generalized)  Unsteadiness on feet  Other abnormalities of gait and mobility  Stiffness of right shoulder, not elsewhere classified  Stiffness of left  shoulder, not elsewhere classified  Attention and concentration deficit  Other lack of coordination    Problem List Patient Active Problem List   Diagnosis Date Noted  . Hematuria   . Urinary retention   . Slow transit constipation   . Yeast infection   . Acute lower UTI   . Other complicated headache syndrome   . Acute blood loss anemia   . Reactive hypertension   . Multiple sclerosis exacerbation ([place]) 04/24/2016  . Neurogenic bladder 04/24/2016  . Weakness of both arms   . Weakness of both legs   . Sepsis ([place]) 04/21/2016  . Bilateral hydronephrosis 04/21/2016  . ARF (acute renal failure) ([place]) 04/21/2016  . Sepsis secondary to UTI ([place]) 04/20/2016  . Attention deficit disorder 02/23/2016  . Gait disturbance 11/24/2015  . Other fatigue 11/24/2015  . Urinary urgency 11/24/2015  . Dehydration   . UTI (lower urinary tract infection) 10/29/2015  . Leukocytosis 10/29/2015  . Nausea and vomiting 10/29/2015  . MS (multiple sclerosis) ([place]) 10/26/2014    [facility] 07/09/2016, 11:17 AM  [facility] [address] [place], [place], [address] Phone: [phone]   Fax:  [phone]  Name: Crystal Park MRN: [id number] Date of Birth: 1960/05/16   [name], OTR/L [facility] [address] [place], [place]  [address] [phone]  phone [phone] 07/09/16 11:17 AM

## 2016-07-09 NOTE — Therapy (Signed)
[facility] [address] [place], [place], [address] Phone: [phone]   Fax:  [phone]  Physical Therapy Treatment  Patient Details  Name: Crystal Park MRN: [id number] Date of Birth: 01-04-1961 Referring Provider: Lawerance Cruel, MD  Encounter Date: 07/09/2016      PT End of Session - 07/09/16 0854    Visit Number 12   Number of Visits 17   Date for PT Re-Evaluation 07/20/16   Authorization Type [employer] (60 visit limit for PT)   Authorization - Visit Number 12   Authorization - Number of Visits 60   PT Start Time 0846   PT Stop Time 0931   PT Time Calculation (min) 45 min   Activity Tolerance Patient limited by fatigue   Behavior During Therapy [facility] for tasks assessed/performed      Past Medical History:  Diagnosis Date  . Benign paroxysmal positional vertigo 10/12/2014  . MS (multiple sclerosis) ([place])    diagnosed 9/16    Past Surgical History:  Procedure Laterality Date  . OOPHORECTOMY Left    2013    There were no vitals filed for this visit.      Subjective Assessment - 07/09/16 0853    Subjective Note she is using cane at home and also to come to therapy.    Patient is accompained by: Family member   Limitations House hold activities;Walking   Patient Stated Goals "To regain strength and balance."    Currently in Pain? No/denies                         [facility] Adult PT Treatment/Exercise - 07/09/16 0001      Ambulation/Gait   Ambulation/Gait Yes   Ambulation/Gait Assistance 5: Supervision   Ambulation/Gait Assistance Details Since pt states she is walking outdoors (to therapy only) with cane (quad tip).  She was able to perform up to 300' with cane at S level with min cues for relaxed posture.  Provided okay to continue doing so with S from husband/family.     Ambulation Distance (Feet) 300 Feet   Assistive device --  quad tip cane   Gait Pattern Step-through  pattern;Decreased arm swing - left;Decreased arm swing - right;Decreased stride length;Trunk flexed;Narrow base of support   Ambulation Surface Level;Unlevel;Outdoor;Indoor;Paved   Stairs Yes   Stairs Assistance 6: Modified independent (Device/Increase time);5: Supervision   Stairs Assistance Details (indicate cue type and reason) Feel that she is safe to do stairs at home.  Discussed how to perform in step to fashion if feeling more fatigued.     Stair Management Technique Alternating pattern;Forwards;With cane;One rail Right;One rail Left   Number of Stairs 4  x 3 reps    Height of Stairs 6   Gait Comments Note pt with increased fatigue today, therefore allowed increased rest breaks between all tasks.       Exercises   Exercises Other Exercises   Other Exercises  Standing therex at counter top: slow high knee marching x 15 reps on each side with cues for posture, standing hip abduction with emphasis on stance control x 10 reps on each side.                  PT Education - 07/09/16 0853    Education provided Yes   Education Details safety on the stairs   Person(s) Educated Patient   Methods Explanation   Comprehension Verbalized understanding  PT Short Term Goals - 06/25/16 2536      PT SHORT TERM GOAL #1   Title Pt will perform initial HEP with mod I using paper handout to maximize progress made in PT. Target date for all STGs: 06/18/16   Baseline met    Time 4   Period Weeks   Status Achieved     PT SHORT TERM GOAL #2   Title Will assess BERG balance test and improve score by 3 points from baseline in order to indicate decreased fall risk.    Baseline 35/56 baseline, 44/56 on 06/21/16   Time 4   Period Weeks   Status Achieved     PT SHORT TERM GOAL #3   Title Pt will improve 5TSS to </=21 secs with single UE support only in order to indicate improved functional strength.    Baseline 18.25 secs without UE support 06/21/16   Time 4   Period Weeks    Status Achieved     PT SHORT TERM GOAL #4   Title Pt will improve TUG to </=32 secs w/ LRAD in order to indicate decreased fall risk.    Baseline 34.19 secs with RW, 44.07 secs with quad tip cane   Time 4   Period Weeks   Status Partially Met     PT SHORT TERM GOAL #5   Title Pt will improve gait speed to 1.75 ft/sec w/ LRAD in order to indicate decreased fall risk and improved efficiency of gait.    Baseline 1.13 ft/sec with RW on 06/21/16   Time 4   Period Weeks   Status Not Met     PT SHORT TERM GOAL #6   Title Pt will ambulate x 200' w/ LRAD over unlevel paved surfaces at mod I level in order to indicate return to community.    Baseline met 06/25/16   Time 4   Period Weeks   Status Achieved           PT Long Term Goals - 05/21/16 1138      PT LONG TERM GOAL #1   Title Pt will be independent with final HEP in order to indicate improved functional mobility and decreased fall risk.  (Target Date for all LTGs: 07/16/16)   Time 8   Period Weeks   Status New     PT LONG TERM GOAL #2   Title Pt will improve BERG balance score by 6 points from baseline in order to indicate decreased fall risk.     Time 8   Period Weeks   Status New     PT LONG TERM GOAL #3   Title Pt will improve 5TSS to </=17 secs without UE support in order to indicate improved functional mobility.     Time 8   Period Weeks   Status New     PT LONG TERM GOAL #4   Title Pt will perform TUG in </=28 secs w/ LRAD in order to indicate decreased fall risk.     Time 8   Period Weeks   Status New     PT LONG TERM GOAL #5   Title Pt will improve gait speed to 2.35 ft/sec in order to indicate decreased fall risk and improved efficiency of gait.    Time 8   Period Weeks   Status New     Additional Long Term Goals   Additional Long Term Goals Yes     PT LONG TERM GOAL #6   Title Pt  will ambulate up to 150' over indoor surfaces with quad tip cane at S level in order to indicate improved functional  independence.     Time 8   Period Weeks   Status New               Plan - 07/09/16 0854    Clinical Impression Statement Skilled session focused on gait over outdoor surfaces with quad tip cane as she is doing this to/from therapy.  Feel she is okay to perform at S level.  Note improved ability to make turns using compensatory strategies from a vestibular standpoint.  Also worked on stairs per pt request to use stairs at home.  Pt very fatigued following stairs, therefore, limited standing exercises to two at end of session.    Rehab Potential Good   Clinical Impairments Affecting Rehab Potential Progressive disease process   PT Treatment/Interventions ADLs/Self Care Home Management;Electrical Stimulation;DME Instruction;Gait training;Stair training;Functional mobility training;Therapeutic activities;Therapeutic exercise;Balance training;Neuromuscular re-education;Patient/family education;Orthotic Fit/Training;Energy conservation;Vestibular;Visual/perceptual remediation/compensation   PT Next Visit Plan look at clam exercise with red band on L leg, gait with quad tip cane indoors-doing turns, begin to have her carry items when using cane, adding balance to HEP as able, recert for 4 more weeks (note to [place])   Consulted and Agree with Plan of Care Patient      Patient will benefit from skilled therapeutic intervention in order to improve the following deficits and impairments:  Decreased activity tolerance, Decreased balance, Decreased coordination, Decreased endurance, Decreased mobility, Decreased strength, Dizziness, Increased edema, Impaired perceived functional ability, Impaired flexibility, Impaired sensation, Impaired UE functional use, Postural dysfunction  Visit Diagnosis: Muscle weakness (generalized)  Unsteadiness on feet  Other abnormalities of gait and mobility     Problem List Patient Active Problem List   Diagnosis Date Noted  . Hematuria   . Urinary retention    . Slow transit constipation   . Yeast infection   . Acute lower UTI   . Other complicated headache syndrome   . Acute blood loss anemia   . Reactive hypertension   . Multiple sclerosis exacerbation ([place]) 04/24/2016  . Neurogenic bladder 04/24/2016  . Weakness of both arms   . Weakness of both legs   . Sepsis ([place]) 04/21/2016  . Bilateral hydronephrosis 04/21/2016  . ARF (acute renal failure) ([place]) 04/21/2016  . Sepsis secondary to UTI ([place]) 04/20/2016  . Attention deficit disorder 02/23/2016  . Gait disturbance 11/24/2015  . Other fatigue 11/24/2015  . Urinary urgency 11/24/2015  . Dehydration   . UTI (lower urinary tract infection) 10/29/2015  . Leukocytosis 10/29/2015  . Nausea and vomiting 10/29/2015  . MS (multiple sclerosis) ([place]) 10/26/2014    Cameron Sprang, PT, MPT [facility] [address] [place], [place], [address] Phone: [phone]   Fax:  [phone] 07/09/16, 1:05 PM  Name: AMBRIE CARTE MRN: [id number] Date of Birth: 10/18/1960

## 2016-07-09 NOTE — Patient Instructions (Signed)

## 2016-07-12 ENCOUNTER — Encounter: Payer: Self-pay | Admitting: Physical Medicine & Rehabilitation

## 2016-07-12 ENCOUNTER — Encounter: Payer: Self-pay | Admitting: Rehabilitation

## 2016-07-12 ENCOUNTER — Ambulatory Visit: Payer: Managed Care, Other (non HMO) | Admitting: Occupational Therapy

## 2016-07-12 ENCOUNTER — Encounter
Payer: Managed Care, Other (non HMO) | Attending: Physical Medicine & Rehabilitation | Admitting: Physical Medicine & Rehabilitation

## 2016-07-12 ENCOUNTER — Ambulatory Visit: Payer: Managed Care, Other (non HMO) | Admitting: Rehabilitation

## 2016-07-12 VITALS — BP 124/79 | HR 75 | Resp 14

## 2016-07-12 DIAGNOSIS — R2689 Other abnormalities of gait and mobility: Secondary | ICD-10-CM

## 2016-07-12 DIAGNOSIS — F419 Anxiety disorder, unspecified: Secondary | ICD-10-CM | POA: Insufficient documentation

## 2016-07-12 DIAGNOSIS — R42 Dizziness and giddiness: Secondary | ICD-10-CM | POA: Diagnosis not present

## 2016-07-12 DIAGNOSIS — R269 Unspecified abnormalities of gait and mobility: Secondary | ICD-10-CM

## 2016-07-12 DIAGNOSIS — M25612 Stiffness of left shoulder, not elsewhere classified: Secondary | ICD-10-CM

## 2016-07-12 DIAGNOSIS — Z5189 Encounter for other specified aftercare: Secondary | ICD-10-CM | POA: Insufficient documentation

## 2016-07-12 DIAGNOSIS — R339 Retention of urine, unspecified: Secondary | ICD-10-CM

## 2016-07-12 DIAGNOSIS — N319 Neuromuscular dysfunction of bladder, unspecified: Secondary | ICD-10-CM

## 2016-07-12 DIAGNOSIS — R41840 Attention and concentration deficit: Secondary | ICD-10-CM

## 2016-07-12 DIAGNOSIS — M25611 Stiffness of right shoulder, not elsewhere classified: Secondary | ICD-10-CM

## 2016-07-12 DIAGNOSIS — K5900 Constipation, unspecified: Secondary | ICD-10-CM | POA: Insufficient documentation

## 2016-07-12 DIAGNOSIS — R51 Headache: Secondary | ICD-10-CM | POA: Diagnosis not present

## 2016-07-12 DIAGNOSIS — G35 Multiple sclerosis: Secondary | ICD-10-CM

## 2016-07-12 DIAGNOSIS — M6281 Muscle weakness (generalized): Secondary | ICD-10-CM | POA: Diagnosis not present

## 2016-07-12 DIAGNOSIS — R2681 Unsteadiness on feet: Secondary | ICD-10-CM

## 2016-07-12 DIAGNOSIS — R278 Other lack of coordination: Secondary | ICD-10-CM

## 2016-07-12 DIAGNOSIS — R410 Disorientation, unspecified: Secondary | ICD-10-CM | POA: Diagnosis not present

## 2016-07-12 NOTE — Therapy (Signed)
[facility] [address] [place], [place], [address] Phone: [phone]   Fax:  [phone]  Physical Therapy Treatment  Patient Details  Name: Crystal Park MRN: [id number] Date of Birth: Aug 26, 1960 Referring Provider: Lawerance Cruel, MD  Encounter Date: 07/12/2016      PT End of Session - 07/12/16 0853    Visit Number 13   Number of Visits 17   Date for PT Re-Evaluation 07/20/16   Authorization Type [employer] (60 visit limit for PT)   Authorization - Visit Number 13   Authorization - Number of Visits 60   PT Start Time 0846   PT Stop Time 0931   PT Time Calculation (min) 45 min   Activity Tolerance Patient limited by fatigue   Behavior During Therapy [facility] for tasks assessed/performed      Past Medical History:  Diagnosis Date  . Benign paroxysmal positional vertigo 10/12/2014  . MS (multiple sclerosis) ([place])    diagnosed 9/16    Past Surgical History:  Procedure Laterality Date  . OOPHORECTOMY Left    2013    There were no vitals filed for this visit.      Subjective Assessment - 07/12/16 1155    Subjective Nothing new to report.    Patient is accompained by: Family member   Limitations House hold activities;Walking   Patient Stated Goals "To regain strength and balance."    Currently in Pain? No/denies                         [facility] Adult PT Treatment/Exercise - 07/12/16 0908      Ambulation/Gait   Ambulation/Gait Yes   Ambulation/Gait Assistance 4: Min assist;3: Mod assist   Ambulation/Gait Assistance Details Assessed gait over outdoor surfaces with quad tip cane including grass as she states she may want to walk the dog while using the cane.  In attempt for a "planned failure" had pt ambulate outdoors with quad tip cane, S over paved surfaces but min to mod A for grassy surfaces and HIGHLY recommend use of RW and S from husband when on grassy surfaces.  Pt with VERY slow gait  speed   Ambulation Distance (Feet) 250 Feet   Assistive device --  quad tip cane    Gait Pattern Step-through pattern;Decreased arm swing - left;Decreased arm swing - right;Decreased stride length;Trunk flexed;Narrow base of support   Ambulation Surface Level;Unlevel;Indoor;Outdoor;Paved;Grass   Stairs Yes   Stairs Assistance 6: Modified independent (Device/Increase time);5: Supervision   Stairs Assistance Details (indicate cue type and reason) Continue to perform stairs to ensure safe home negotiation at home.  She was able to complete 2 sets of 8 steps today with single rail and use of cane.  Pt alternates between step to fashion and alternating fashion depending on level of fatigue.     Stair Management Technique One rail Right;Alternating pattern;Step to pattern;Forwards   Number of Stairs 4  x 2 reps, rest then 2 more reps   Height of Stairs 6   Gait Comments Note that with stairs pt requires increased rest breaks between set of 8 steps, therefore recommend she start there and also recommended step to fashion if more fatigued.                  PT Education - 07/12/16 1200    Education provided Yes   Education Details Lengthy discussion regarding gait outdoors on grass with RW vs cane.  Recommend RW and S at all times when on grass.    Person(s) Educated Patient   Methods Explanation   Comprehension Verbalized understanding          PT Short Term Goals - 06/25/16 5830      PT SHORT TERM GOAL #1   Title Pt will perform initial HEP with mod I using paper handout to maximize progress made in PT. Target date for all STGs: 06/18/16   Baseline met    Time 4   Period Weeks   Status Achieved     PT SHORT TERM GOAL #2   Title Will assess BERG balance test and improve score by 3 points from baseline in order to indicate decreased fall risk.    Baseline 35/56 baseline, 44/56 on 06/21/16   Time 4   Period Weeks   Status Achieved     PT SHORT TERM GOAL #3   Title Pt will  improve 5TSS to </=21 secs with single UE support only in order to indicate improved functional strength.    Baseline 18.25 secs without UE support 06/21/16   Time 4   Period Weeks   Status Achieved     PT SHORT TERM GOAL #4   Title Pt will improve TUG to </=32 secs w/ LRAD in order to indicate decreased fall risk.    Baseline 34.19 secs with RW, 44.07 secs with quad tip cane   Time 4   Period Weeks   Status Partially Met     PT SHORT TERM GOAL #5   Title Pt will improve gait speed to 1.75 ft/sec w/ LRAD in order to indicate decreased fall risk and improved efficiency of gait.    Baseline 1.13 ft/sec with RW on 06/21/16   Time 4   Period Weeks   Status Not Met     PT SHORT TERM GOAL #6   Title Pt will ambulate x 200' w/ LRAD over unlevel paved surfaces at mod I level in order to indicate return to community.    Baseline met 06/25/16   Time 4   Period Weeks   Status Achieved           PT Long Term Goals - 05/21/16 1138      PT LONG TERM GOAL #1   Title Pt will be independent with final HEP in order to indicate improved functional mobility and decreased fall risk.  (Target Date for all LTGs: 07/16/16)   Time 8   Period Weeks   Status New     PT LONG TERM GOAL #2   Title Pt will improve BERG balance score by 6 points from baseline in order to indicate decreased fall risk.     Time 8   Period Weeks   Status New     PT LONG TERM GOAL #3   Title Pt will improve 5TSS to </=17 secs without UE support in order to indicate improved functional mobility.     Time 8   Period Weeks   Status New     PT LONG TERM GOAL #4   Title Pt will perform TUG in </=28 secs w/ LRAD in order to indicate decreased fall risk.     Time 8   Period Weeks   Status New     PT LONG TERM GOAL #5   Title Pt will improve gait speed to 2.35 ft/sec in order to indicate decreased fall risk and improved efficiency of gait.    Time 8  Period Weeks   Status New     Additional Long Term Goals    Additional Long Term Goals Yes     PT LONG TERM GOAL #6   Title Pt will ambulate up to 150' over indoor surfaces with quad tip cane at S level in order to indicate improved functional independence.     Time 8   Period Weeks   Status New               Plan - 07/12/16 1201    Clinical Impression Statement Skilled session focused on stair training for improved technique and also endurance to complete 16 at home.  She is only able to complete 8 at a time, but also note that this is up/down 4.  Also went over gait outdoors with cane on grass as she states she feels that she will soon be able to walk dog with cane in back yard.  When performed during session, requires up to mod A to maintain balance and therefore recommend RW and S at all times.  Pt okay to D/C following next week in that she would like to save visits in case of exacerbation.    Rehab Potential Good   Clinical Impairments Affecting Rehab Potential Progressive disease process   PT Treatment/Interventions ADLs/Self Care Home Management;Electrical Stimulation;DME Instruction;Gait training;Stair training;Functional mobility training;Therapeutic activities;Therapeutic exercise;Balance training;Neuromuscular re-education;Patient/family education;Orthotic Fit/Training;Energy conservation;Vestibular;Visual/perceptual remediation/compensation   PT Next Visit Plan start to look LTGs   Consulted and Agree with Plan of Care Patient      Patient will benefit from skilled therapeutic intervention in order to improve the following deficits and impairments:  Decreased activity tolerance, Decreased balance, Decreased coordination, Decreased endurance, Decreased mobility, Decreased strength, Dizziness, Increased edema, Impaired perceived functional ability, Impaired flexibility, Impaired sensation, Impaired UE functional use, Postural dysfunction  Visit Diagnosis: Muscle weakness (generalized)  Unsteadiness on feet  Other abnormalities of  gait and mobility     Problem List Patient Active Problem List   Diagnosis Date Noted  . Hematuria   . Urinary retention   . Slow transit constipation   . Yeast infection   . Acute lower UTI   . Other complicated headache syndrome   . Acute blood loss anemia   . Reactive hypertension   . Multiple sclerosis exacerbation ([place]) 04/24/2016  . Neurogenic bladder 04/24/2016  . Weakness of both arms   . Weakness of both legs   . Sepsis ([place]) 04/21/2016  . Bilateral hydronephrosis 04/21/2016  . ARF (acute renal failure) ([place]) 04/21/2016  . Sepsis secondary to UTI ([place]) 04/20/2016  . Attention deficit disorder 02/23/2016  . Gait disturbance 11/24/2015  . Other fatigue 11/24/2015  . Urinary urgency 11/24/2015  . Dehydration   . UTI (lower urinary tract infection) 10/29/2015  . Leukocytosis 10/29/2015  . Nausea and vomiting 10/29/2015  . MS (multiple sclerosis) ([place]) 10/26/2014    Cameron Sprang, PT, MPT [facility] [address] [place], [place], [address] Phone: [phone]   Fax:  [phone] 07/12/16, 12:06 PM  Name: KAYONNA LAWNICZAK MRN: [id number] Date of Birth: 05-17-1960

## 2016-07-12 NOTE — Progress Notes (Signed)
Subjective:    Patient ID: Crystal Park, female    DOB: 1960-12-04, 56 y.o.   MRN: [id number]  HPI 56 year old right-handed female with history of multiple sclerosis presents for follow up for MS exacerbation.    Last clinic visit 05/16/16.  Presents with husband, who provides majority of the history. Since that time, she saw Neurology, no changes in medication.   She is still in therapies for 1 more week.  She has stopped taking pain medications, including gabapentin.  She saw Urology last week for urodynamic study. She was started on a leg bag by Urology. Headaches and constipation have improved. She continues to use a cane for ambulation. She had one episode of near fall when she turned too fast.   Pain Inventory Average Pain 1 Pain Right Now 0 My pain is discomfort  In the last 24 hours, has pain interfered with the following? General activity 0 Relation with others 0 Enjoyment of life 0 What TIME of day is your pain at its worst? no pain Sleep (in general) Good  Pain is worse with: walking Pain improves with: rest, heat/ice and medication Relief from Meds: n/a  Mobility walk with assistance use a cane use a walker how many minutes can you walk? 5 ability to climb steps?  no do you drive?  no Do you have any goals in this area?  yes  Function disabled: date disabled . I need assistance with the following:  meal prep, household duties and shopping  Neuro/Psych bladder control problems weakness tingling trouble walking dizziness  Prior Studies Any changes since last visit?  no  Physicians involved in your care Any changes since last visit?  no   No family history on file. Social History   Social History  . Marital status: Married    Spouse name: N/A  . Number of children: N/A  . Years of education: N/A   Social History Main Topics  . Smoking status: Never Smoker  . Smokeless tobacco: Never Used  . Alcohol use No  . Drug use: No  . Sexual  activity: Not Currently   Other Topics Concern  . Not on file   Social History Narrative  . No narrative on file   Past Surgical History:  Procedure Laterality Date  . OOPHORECTOMY Left    2013   Past Medical History:  Diagnosis Date  . Benign paroxysmal positional vertigo 10/12/2014  . MS (multiple sclerosis) ([facility])    diagnosed 9/16   BP 124/79 (BP Location: Right Arm, Patient Position: Sitting, Cuff Size: Normal)   Pulse 75   Resp 14   LMP 10/25/2015   SpO2 98%   Opioid Risk Score:   Fall Risk Score:  `1  Depression screen PHQ 2/9  Depression screen PHQ 2/9 05/16/2016  Decreased Interest 2  Down, Depressed, Hopeless 0  PHQ - 2 Score 2  Altered sleeping 1  Tired, decreased energy 3  Change in appetite 2  Feeling bad or failure about yourself  0  Trouble concentrating 3  Moving slowly or fidgety/restless 0  Suicidal thoughts 0  PHQ-9 Score 11  Difficult doing work/chores Very difficult    Review of Systems  HENT: Negative.   Eyes: Negative.   Respiratory: Negative.   Cardiovascular: Negative.   Gastrointestinal: Positive for abdominal pain.  Endocrine: Negative.   Genitourinary: Positive for difficulty urinating.  Musculoskeletal: Positive for gait problem.  Skin: Negative.   Allergic/Immunologic: Negative.   Neurological: Positive for dizziness and weakness.  Tingling  Hematological: Negative.   Psychiatric/Behavioral: Negative.   All other systems reviewed and are negative.      Objective:   Physical Exam Constitutional: NAD. Vital signs reviewed. Well-developed.  HENT: Normocephalic and atraumatic.  Eyes: EOMI. No discharge.  Cardiovascular: RRR. No JVD. Respiratory: Unlabored. Clear.  GI: Soft. Bowel sounds are normal.  GU: Foley at present Musculoskeletal: She exhibits no edema, no tenderness.  Neurological: Alert and oriented x3. RUE 4+/5 prox to distal.  RLE 3/5 HF, 4-4+/5 KE, 4+/5 ADF/PF LUE 4+/5 proximal to distal.  LLE 4+/5 HF,  4+/5 KE, 4+/5 ADF/PF Skin. Warm and dry. Intact.  Psych: Flat.    Assessment & Plan:  56 year old right-handed female with history of multiple sclerosis presents for follow up for MS exacerbation.    1.  MS.   Cont meds  Cont follow up with Neurology  Cont therapies  2. Mood/Dizziness  Cont Valium 5 mg every 12 hours as needed   3. Neurogenic bladder.   Follow up with Urology  Leg bag at present  Pt with recent urodynamic study  4. Gait abnormality  Cont cane for safety

## 2016-07-12 NOTE — Patient Instructions (Addendum)
Strengthening: Resisted Flexion   Attach tube to door.  Hold tubing with one arm at side. Pull forward and up. Move shoulder through pain-free range of motion. Repeat 10 times per set.  Do 1 sessions per day.     Hold onto something (chair/table) with other hand for balance.     Strengthening: Resisted Extension   Attach one end to door.  Hold tubing in one hand, arm forward. Pull arm back, elbow straight. Repeat 10 times per set. Do 1 sessions per day.  Hold onto something (chair/table) with other hand for balance.   Resisted Horizontal Abduction: Bilateral   Sit, tubing in both hands, arms out in front (BELLY BUTTON LEVEL). Keeping arms straight, pinch shoulder blades together and stretch arms out. Repeat 10 times per set.  Do 1 sessions per day.

## 2016-07-12 NOTE — Therapy (Signed)
[facility] [address] [place], [place], [address] Phone: [phone]   Fax:  [phone]  Occupational Therapy Treatment  Patient Details  Name: Crystal Park MRN: [id number] Date of Birth: [birth year] Referring Provider: Dr. Lona Kettle  Encounter Date: 07/12/2016      OT End of Session - 07/12/16 0815    Visit Number 12   Number of Visits 17   Date for OT Re-Evaluation 07/20/16   Authorization Type [employer], 60 visit limit combined OT/ST, no auth needed   OT Start Time 0805   OT Stop Time 0848   OT Time Calculation (min) 43 min   Activity Tolerance Patient tolerated treatment well   Behavior During Therapy [facility] for tasks assessed/performed      Past Medical History:  Diagnosis Date  . Benign paroxysmal positional vertigo 10/12/2014  . MS (multiple sclerosis) ([place])    diagnosed 9/16    Past Surgical History:  Procedure Laterality Date  . OOPHORECTOMY Left    2013    There were no vitals filed for this visit.      Subjective Assessment - 07/12/16 0832    Subjective  "I'm getting my own drink.  I'm opening my own drink now."   Pertinent History MS diagnosis 9/16 (but some symptoms in 2010)   Limitations urinary catheter, fall risk   Patient Stated Goals bathe herself (including transfer), simple cold/microwave meal prep, fold laundry, feed dog, dress mod I (including getting clothes out)   Currently in Pain? No/denies      Self care:  Began checking goals and discussed d/c vs. Renewal.  Pt reports desire to d/c next week due to being pleased with progress, husband going out of town in June, and saving visits for later in year or in case of exacerbation.--see goal section below.  In standing functional reaching with RUE with LUE support  for incr activity tolerance, coordination, and balance.  (Placing large pegs in vertical pegboard x4 rows with rest after 2 rows).  Completing purdue pegboard with  R hand for incr coordination with incr time/min difficulty.  Picking up blocks using gripper set on level 1 with black spring for sustained grip strength.  Pt able to complete all with no significant rest break and min difficulty.                              OT Education - 07/12/16 0957    Education Details Yellow low range theraband HEP (with UE support in standing)   Person(s) Educated Patient   Methods Explanation;Demonstration;Verbal cues;Handout   Comprehension Verbalized understanding;Returned demonstration;Verbal cues required          OT Short Term Goals - 07/12/16 0819      OT SHORT TERM GOAL #1   Title Pt will be independent with initial HEP.--check STGs 06/20/16   Time 4   Period Weeks   Status Achieved  06/14/16     OT SHORT TERM GOAL #2   Title Pt will be able to fold laundry in sitting (with set-up).   Time 4   Period Weeks   Status Achieved  06/14/16     OT SHORT TERM GOAL #3   Title Pt will be able to retrieve clothing prior to dressing mod I.   Time 4   Period Weeks   Status Achieved  06/14/16     OT SHORT TERM GOAL #4   Title Pt  will improve bilateral hand coordination for ADLs as shown by improving time on 9-hole peg test by at least 4 sec bilaterally.   Baseline R-34.85sec, L-33.56sec   Time 4   Period Weeks   Status Achieved  RUE 33.72 secs LUE 29.75- not fully met.  07/12/16:  R-28.91sec, L-29.44sec     OT SHORT TERM GOAL #5   Title Pt will improve R hand strength by at least 5lbs for incr ease with opening packages/containers.   Baseline R-30lbs   Time 4   Period Weeks   Status Achieved  40 lbs     OT SHORT TERM GOAL #6   Title Pt will verbalize understanding of energy conservation strategies and AE for incr safety and independence with ADLs/IADLs.   Time 4   Period Weeks   Status Achieved  needs reinforcement.  06/25/16 met           OT Long Term Goals - 07/12/16 0821      OT LONG TERM GOAL #1   Title Pt  will be independent with updated HEP.--check LTGs 07/20/16   Time 8   Period Weeks   Status New     OT LONG TERM GOAL #2   Title Pt will perform bathing mod I including shower transfer.   Baseline supervision-min A   Time 8   Period Weeks   Status Achieved  06/25/16  has performed once.  07/09/16  distant supervision-mod I (husband in the house)     OT LONG TERM GOAL #3   Title Pt will perform simple cold meal/snack prep and warm items in microwave mod I.   Time 8   Period Weeks   Status Achieved  06/28/16:  partially met.  pt is performing cold meal prep.  07/12/16  met     OT LONG TERM GOAL #4   Title Pt will improve R hand strength by at least 15lbs for incr ease with opening packages/containers.  (upgraded 06/28/16 to 15lb improvement)   Baseline 30lbs   Time 8   Period Weeks   Status Revised  07/12/16:  40lbs     OT LONG TERM GOAL #5   Title Pt will be able to feed dog mod I.   Time 8   Period Weeks   Status Achieved  06/25/16  per pt report     OT LONG TERM GOAL #6   Title Pt will verbalize understanding of memory compensation strategies for ADLs prn.   Time 8   Period Weeks   Status New               Plan - 07/12/16 7654    Clinical Impression Statement Pt is making good progress towards goals with improved coordination, functional mobility, and activity tolerance.   Rehab Potential Good   Clinical Impairments Affecting Rehab Potential cognitive/memory deficits   OT Frequency 2x / week   OT Duration 8 weeks   OT Treatment/Interventions Self-care/ADL training;Cryotherapy;Parrafin;Therapeutic exercise;DME and/or AE instruction;Therapist, nutritional;Therapeutic activities;Patient/family education;Balance training;Cognitive remediation/compensation;Manual Therapy;Splinting;Neuromuscular education;Fluidtherapy;Ultrasound;Electrical Stimulation;Moist Heat;Energy conservation;Contrast Bath;Passive range of motion;Therapeutic exercises;Visual/perceptual  remediation/compensation   Plan review theraband HEP, review memory compensation strategies; anticipate d/c at end of next week   OT Home Exercise Plan Education provided:  UE strengthening HEP; coordination/red putty HEP 06/07/16; 07/12/16  yellow band HEP   Consulted and Agree with Plan of Care Patient   Family Member Consulted husband      Patient will benefit from skilled therapeutic intervention in order to improve the  following deficits and impairments:  Decreased balance, Decreased endurance, Decreased mobility, Decreased range of motion, Decreased knowledge of use of DME, Decreased strength, Impaired UE functional use, Impaired flexibility, Decreased activity tolerance, Decreased coordination, Decreased cognition  Visit Diagnosis: Muscle weakness (generalized)  Unsteadiness on feet  Other abnormalities of gait and mobility  Stiffness of right shoulder, not elsewhere classified  Stiffness of left shoulder, not elsewhere classified  Attention and concentration deficit  Other lack of coordination    Problem List Patient Active Problem List   Diagnosis Date Noted  . Hematuria   . Urinary retention   . Slow transit constipation   . Yeast infection   . Acute lower UTI   . Other complicated headache syndrome   . Acute blood loss anemia   . Reactive hypertension   . Multiple sclerosis exacerbation ([place]) 04/24/2016  . Neurogenic bladder 04/24/2016  . Weakness of both arms   . Weakness of both legs   . Sepsis ([place]) 04/21/2016  . Bilateral hydronephrosis 04/21/2016  . ARF (acute renal failure) ([place]) 04/21/2016  . Sepsis secondary to UTI ([place]) 04/20/2016  . Attention deficit disorder 02/23/2016  . Gait disturbance 11/24/2015  . Other fatigue 11/24/2015  . Urinary urgency 11/24/2015  . Dehydration   . UTI (lower urinary tract infection) 10/29/2015  . Leukocytosis 10/29/2015  . Nausea and vomiting 10/29/2015  . MS (multiple sclerosis) ([place]) 10/26/2014     [facility] 07/12/2016, 9:59 AM  [facility] [address] [place], [place], [address] Phone: [phone]   Fax:  [phone]  Name: OVIDA DELAGARZA MRN: [id number] Date of Birth: [birth year]   [name], OTR/L [facility] [address] [place], [place]  [address] [phone] phone [phone] 07/12/16 10:03 AM

## 2016-07-16 ENCOUNTER — Encounter: Payer: Self-pay | Admitting: Rehabilitation

## 2016-07-16 ENCOUNTER — Ambulatory Visit: Payer: Managed Care, Other (non HMO) | Admitting: Rehabilitation

## 2016-07-16 ENCOUNTER — Encounter: Payer: Self-pay | Admitting: *Deleted

## 2016-07-16 ENCOUNTER — Ambulatory Visit: Payer: Managed Care, Other (non HMO) | Admitting: *Deleted

## 2016-07-16 DIAGNOSIS — M6281 Muscle weakness (generalized): Secondary | ICD-10-CM

## 2016-07-16 DIAGNOSIS — M25611 Stiffness of right shoulder, not elsewhere classified: Secondary | ICD-10-CM

## 2016-07-16 DIAGNOSIS — R2681 Unsteadiness on feet: Secondary | ICD-10-CM

## 2016-07-16 DIAGNOSIS — R278 Other lack of coordination: Secondary | ICD-10-CM

## 2016-07-16 DIAGNOSIS — M25612 Stiffness of left shoulder, not elsewhere classified: Secondary | ICD-10-CM

## 2016-07-16 DIAGNOSIS — R41840 Attention and concentration deficit: Secondary | ICD-10-CM

## 2016-07-16 DIAGNOSIS — R29898 Other symptoms and signs involving the musculoskeletal system: Secondary | ICD-10-CM

## 2016-07-16 DIAGNOSIS — R2689 Other abnormalities of gait and mobility: Secondary | ICD-10-CM

## 2016-07-16 NOTE — Patient Instructions (Addendum)
Memory Compensation Strategies  1. Use "WARM" strategy.  W= write it down  A= associate it  R= repeat it  M= make a mental note  2.   You can keep a Glass blower/designer.  Use a 3-ring notebook with sections for the following: calendar, important names and phone numbers,  medications, doctors' names/phone numbers, lists/reminders, and a section to journal what you did  each day.   3.    Use a calendar to write appointments down.  4.    Write yourself a schedule for the day.  This can be placed on the calendar or in a separate section of the Memory Notebook.  Keeping a  regular schedule can help memory.  5.    Use medication organizer with sections for each day or morning/evening pills.  You may need help loading it  6.    Keep a basket, or pegboard by the door.  Place items that you need to take out with you in the basket or on the pegboard.  You may also want to  include a message board for reminders.  7.    Use sticky notes.  Place sticky notes with reminders in a place where the task is performed.  For example: " turn off the  stove" placed by the stove, "lock the door" placed on the door at eye level, " take your medications" on  the bathroom mirror or by the place where you normally take your medications.  8.    Use alarms/timers.  Use while cooking to remind yourself to check on food or as a reminder to take your medicine, or as a  reminder to make a call, or as a reminder to perform another task, etc.   Do putty exercises: 2 sets of 8-10 each - take a break between sets, just like you do for your 3# weight exercises.

## 2016-07-16 NOTE — Therapy (Signed)
[facility] [address] [place], [place], [address] Phone: [phone]   Fax:  [phone]  Physical Therapy Treatment  Patient Details  Name: Crystal Park MRN: [id number] Date of Birth: Jul 14, 1960 Referring Provider: Lawerance Cruel, MD  Encounter Date: 07/16/2016      PT End of Session - 07/16/16 0858    Visit Number 14   Number of Visits 17   Date for PT Re-Evaluation 07/20/16   Authorization Type [employer] (60 visit limit for PT)   Authorization - Visit Number 14   Authorization - Number of Visits 60   PT Start Time 0846   PT Stop Time 0931   PT Time Calculation (min) 45 min   Activity Tolerance Patient limited by fatigue   Behavior During Therapy [facility] for tasks assessed/performed      Past Medical History:  Diagnosis Date  . Benign paroxysmal positional vertigo 10/12/2014  . MS (multiple sclerosis) ([place])    diagnosed 9/16    Past Surgical History:  Procedure Laterality Date  . OOPHORECTOMY Left    2013    There were no vitals filed for this visit.      Subjective Assessment - 07/16/16 0857    Subjective Reports some issues with her catheter, husband is calling MD about the issue.     Patient is accompained by: Family member   Limitations House hold activities;Walking   Patient Stated Goals "To regain strength and balance."    Currently in Pain? No/denies                         [facility] Adult PT Treatment/Exercise - 07/16/16 0904      Transfers   Transfers Sit to Stand;Stand to Sit   Sit to Stand 7: Independent   Five time sit to stand comments  14.72 secs without UE support      Standardized Balance Assessment   Standardized Balance Assessment Berg Balance Test;Timed Up and Go Test     Berg Balance Test   Sit to Stand Able to stand without using hands and stabilize independently   Standing Unsupported Able to stand safely 2 minutes   Sitting with Back Unsupported but  Feet Supported on Floor or Stool Able to sit safely and securely 2 minutes   Stand to Sit Sits safely with minimal use of hands   Transfers Able to transfer safely, minor use of hands   Standing Unsupported with Eyes Closed Able to stand 10 seconds safely   Standing Ubsupported with Feet Together Able to place feet together independently and stand 1 minute safely   From Standing, Reach Forward with Outstretched Arm Can reach confidently >25 cm (10")   From Standing Position, Pick up Object from Floor Able to pick up shoe, needs supervision   From Standing Position, Turn to Look Behind Over each Shoulder Looks behind one side only/other side shows less weight shift   Turn 360 Degrees Able to turn 360 degrees safely but slowly  due to vestibular/visual compensation   Standing Unsupported, Alternately Place Feet on Step/Stool Able to complete 4 steps without aid or supervision   Standing Unsupported, One Foot in Front Able to plae foot ahead of the other independently and hold 30 seconds   Standing on One Leg Able to lift leg independently and hold equal to or more than 3 seconds  on LLE   Total Score 47     Timed Up and Go  Test   TUG Normal TUG   Normal TUG (seconds) 20.49  w/ cane, 20.78 w/ RW                PT Education - 07/16/16 0857    Education provided Yes   Education Details BERG balance results   Person(s) Educated Patient   Methods Explanation   Comprehension Verbalized understanding          PT Short Term Goals - 06/25/16 2094      PT SHORT TERM GOAL #1   Title Pt will perform initial HEP with mod I using paper handout to maximize progress made in PT. Target date for all STGs: 06/18/16   Baseline met    Time 4   Period Weeks   Status Achieved     PT SHORT TERM GOAL #2   Title Will assess BERG balance test and improve score by 3 points from baseline in order to indicate decreased fall risk.    Baseline 35/56 baseline, 44/56 on 06/21/16   Time 4   Period  Weeks   Status Achieved     PT SHORT TERM GOAL #3   Title Pt will improve 5TSS to </=21 secs with single UE support only in order to indicate improved functional strength.    Baseline 18.25 secs without UE support 06/21/16   Time 4   Period Weeks   Status Achieved     PT SHORT TERM GOAL #4   Title Pt will improve TUG to </=32 secs w/ LRAD in order to indicate decreased fall risk.    Baseline 34.19 secs with RW, 44.07 secs with quad tip cane   Time 4   Period Weeks   Status Partially Met     PT SHORT TERM GOAL #5   Title Pt will improve gait speed to 1.75 ft/sec w/ LRAD in order to indicate decreased fall risk and improved efficiency of gait.    Baseline 1.13 ft/sec with RW on 06/21/16   Time 4   Period Weeks   Status Not Met     PT SHORT TERM GOAL #6   Title Pt will ambulate x 200' w/ LRAD over unlevel paved surfaces at mod I level in order to indicate return to community.    Baseline met 06/25/16   Time 4   Period Weeks   Status Achieved           PT Long Term Goals - 07/16/16 7096      PT LONG TERM GOAL #1   Title Pt will be independent with final HEP in order to indicate improved functional mobility and decreased fall risk.  (Target Date for all LTGs: 07/16/16)   Time 8   Period Weeks   Status New     PT LONG TERM GOAL #2   Title Pt will improve BERG balance score by 6 points from baseline in order to indicate decreased fall risk.     Baseline from 35 to 44/56, met at STG, 47/56 on 07/16/16   Time 8   Period Weeks   Status Achieved     PT LONG TERM GOAL #3   Title Pt will improve 5TSS to </=17 secs without UE support in order to indicate improved functional mobility.     Baseline 14.72 secs without UE support   Time 8   Period Weeks   Status Achieved     PT LONG TERM GOAL #4   Title Pt will perform TUG in </=28 secs w/  LRAD in order to indicate decreased fall risk.     Baseline met with both cane (quad tip) and RW, cane 20.49 and RW 20.78 secs   Time 8    Period Weeks   Status Achieved     PT LONG TERM GOAL #5   Title Pt will improve gait speed to 2.35 ft/sec in order to indicate decreased fall risk and improved efficiency of gait.    Time 8   Period Weeks   Status New     PT LONG TERM GOAL #6   Title Pt will ambulate up to 150' over indoor surfaces with quad tip cane at S level in order to indicate improved functional independence.     Time 8   Period Weeks   Status New               Plan - 07/16/16 5638    Clinical Impression Statement Skilled session began looking at [place].  Pt has met 3/3 LTGs checked with marked improvement on BERG balance, meeting goals for 5TSS and TUG with cane and RW.  Pt making great gains towards remaining LTGs and will plan to DC on next visit.    Rehab Potential Good   Clinical Impairments Affecting Rehab Potential Progressive disease process   PT Treatment/Interventions ADLs/Self Care Home Management;Electrical Stimulation;DME Instruction;Gait training;Stair training;Functional mobility training;Therapeutic activities;Therapeutic exercise;Balance training;Neuromuscular re-education;Patient/family education;Orthotic Fit/Training;Energy conservation;Vestibular;Visual/perceptual remediation/compensation   PT Next Visit Plan Continue LTGs and dc   Consulted and Agree with Plan of Care Patient      Patient will benefit from skilled therapeutic intervention in order to improve the following deficits and impairments:  Decreased activity tolerance, Decreased balance, Decreased coordination, Decreased endurance, Decreased mobility, Decreased strength, Dizziness, Increased edema, Impaired perceived functional ability, Impaired flexibility, Impaired sensation, Impaired UE functional use, Postural dysfunction  Visit Diagnosis: Muscle weakness (generalized)  Unsteadiness on feet  Other abnormalities of gait and mobility     Problem List Patient Active Problem List   Diagnosis Date Noted  . Hematuria    . Urinary retention   . Slow transit constipation   . Yeast infection   . Acute lower UTI   . Other complicated headache syndrome   . Acute blood loss anemia   . Reactive hypertension   . Multiple sclerosis exacerbation ([place]) 04/24/2016  . Neurogenic bladder 04/24/2016  . Weakness of both arms   . Weakness of both legs   . Sepsis ([place]) 04/21/2016  . Bilateral hydronephrosis 04/21/2016  . ARF (acute renal failure) ([place]) 04/21/2016  . Sepsis secondary to UTI ([place]) 04/20/2016  . Attention deficit disorder 02/23/2016  . Gait disturbance 11/24/2015  . Other fatigue 11/24/2015  . Urinary urgency 11/24/2015  . Dehydration   . UTI (lower urinary tract infection) 10/29/2015  . Leukocytosis 10/29/2015  . Nausea and vomiting 10/29/2015  . MS (multiple sclerosis) ([place]) 10/26/2014    Cameron Sprang, PT, MPT [facility] [address] [place], [place], [address] Phone: [phone]   Fax:  [phone] 07/16/16, 11:34 AM  Name: TAKERRA LUPINACCI MRN: [id number] Date of Birth: 19-Apr-1960

## 2016-07-16 NOTE — Therapy (Signed)
[facility] [address] [place], [place], [address] Phone: [phone]   Fax:  [phone]  Occupational Therapy Treatment  Patient Details  Name: Crystal Park MRN: [id number] Date of Birth: 07-28-1960 Referring Provider: Dr. Lona Kettle  Encounter Date: 07/16/2016      OT End of Session - 07/16/16 0951    Visit Number 13   Number of Visits 17   Date for OT Re-Evaluation 07/20/16   Authorization Type [employer], 60 visit limit combined OT/ST, no auth needed   OT Start Time 0800   OT Stop Time 0845   OT Time Calculation (min) 45 min   Activity Tolerance Patient tolerated treatment well   Behavior During Therapy [facility] for tasks assessed/performed      Past Medical History:  Diagnosis Date  . Benign paroxysmal positional vertigo 10/12/2014  . MS (multiple sclerosis) ([place])    diagnosed 9/16    Past Surgical History:  Procedure Laterality Date  . OOPHORECTOMY Left    2013    There were no vitals filed for this visit.      Subjective Assessment - 07/16/16 0804    Subjective  "I am having problems with my catheter" Pt reports leaking from catheter starting last night so she/husband changed it. This morning it was empty. Her husband is going to call the MD office to discuss.   Patient is accompained by: Family member   Pertinent History MS diagnosis 9/16 (but some symptoms in 2010)   Limitations urinary catheter, fall risk   Patient Stated Goals bathe herself (including transfer), simple cold/microwave meal prep, fold laundry, feed dog, dress mod I (including getting clothes out)   Currently in Pain? No/denies   Pain Score 0-No pain            [facility] OT Assessment - 07/16/16 0001      ROM / Strength   AROM / PROM / Strength Strength     Hand Function   Right Hand Grip (lbs) 45   Left Hand Grip (lbs) 46                  OT Treatments/Exercises (OP) - 07/16/16 0001      ADLs   ADL  Education Given Yes   Increased Safety Strategies Reviewed safety strategies for ADL's and self care tasks. Take rest breaks PRN; Place frequently used items within reach on countertops, remove clutter (area rugs), close by etc.    Compensatory Strategies Reviewed compensatory strategies ie. leave tasks that can wait; take rest breaks PRN; prioritize etc.     Cognitive Exercises   Other Cognitive Exercises 1 Memory strategies, handout issued and reviewed with pt in clinic today. Pt able to state 2-3 strategies that she uses at home to assist with memory tech's.     Exercises   Exercises Elbow;Hand     Elbow Exercises   Bar Weights/Barbell (Elbow Flexion) 3 lbs  R & L hands for bicep curls 2 sets of 8 each   Elbow Extension Strengthening;Right;Left  3# wt for flexion/extension x2 sets of 8 each bilaterally     Additional Elbow Exercises   Theraputty - Grip Red putty for grip and pinch with right hand.     Fine Motor Coordination   Other Fine Motor Exercises small pegboard pattern using right hand. Min difficulty noted. Encouraged rest breaks PRN to avoid fatigue.  to assist with coordination.   Other Fine Motor Exercises Removing 3-4 pegs at a time  from small pegboard and placing one at a time into container with min difficulty noted.      Memory Compensation Strategies  1. Use "WARM" strategy.  W= write it down  A= associate it  R= repeat it  M= make a mental note  2.   You can keep a Social worker.  Use a 3-ring notebook with sections for the following: calendar, important names and phone numbers,  medications, doctors' names/phone numbers, lists/reminders, and a section to journal what you did  each day.   3.    Use a calendar to write appointments down.  4.    Write yourself a schedule for the day.  This can be placed on the calendar or in a separate section of the Memory Notebook.  Keeping a  regular schedule can help memory.  5.    Use medication organizer with  sections for each day or morning/evening pills.  You may need help loading it  6.    Keep a basket, or pegboard by the door.  Place items that you need to take out with you in the basket or on the pegboard.  You may also want to  include a message board for reminders.  7.    Use sticky notes.  Place sticky notes with reminders in a place where the task is performed.  For example: " turn off the  stove" placed by the stove, "lock the door" placed on the door at eye level, " take your medications" on  the bathroom mirror or by the place where you normally take your medications.  8.    Use alarms/timers.  Use while cooking to remind yourself to check on food or as a reminder to take your medicine, or as a  reminder to make a call, or as a reminder to perform another task, etc.   Do putty exercises: 2 sets of 8-10 each - take a break between sets, just like you do for your 3# weight exercises.  Home safety           OT Education - 07/16/16 [phone]    Education provided Yes   Education Details Reviewed Safety in the home; memory strategies; HEP review. D/C planning begun.   Person(s) Educated Patient   Methods Explanation;Demonstration;Handout;Verbal cues   Comprehension Verbalized understanding;Returned demonstration          OT Short Term Goals - 07/12/16 0819      OT SHORT TERM GOAL #1   Title Pt will be independent with initial HEP.--check STGs 06/20/16   Time 4   Period Weeks   Status Achieved  06/14/16     OT SHORT TERM GOAL #2   Title Pt will be able to fold laundry in sitting (with set-up).   Time 4   Period Weeks   Status Achieved  06/14/16     OT SHORT TERM GOAL #3   Title Pt will be able to retrieve clothing prior to dressing mod I.   Time 4   Period Weeks   Status Achieved  06/14/16     OT SHORT TERM GOAL #4   Title Pt will improve bilateral hand coordination for ADLs as shown by improving time on 9-hole peg test by at least 4 sec bilaterally.   Baseline  R-34.85sec, L-33.56sec   Time 4   Period Weeks   Status Achieved  RUE 33.72 secs LUE 29.75- not fully met.  07/12/16:  R-28.91sec, L-29.44sec     OT SHORT TERM GOAL #  5   Title Pt will improve R hand strength by at least 5lbs for incr ease with opening packages/containers.   Baseline R-30lbs   Time 4   Period Weeks   Status Achieved  40 lbs     OT SHORT TERM GOAL #6   Title Pt will verbalize understanding of energy conservation strategies and AE for incr safety and independence with ADLs/IADLs.   Time 4   Period Weeks   Status Achieved  needs reinforcement.  06/25/16 met           OT Long Term Goals - 07/12/16 0821      OT LONG TERM GOAL #1   Title Pt will be independent with updated HEP.--check LTGs 07/20/16   Time 8   Period Weeks   Status New     OT LONG TERM GOAL #2   Title Pt will perform bathing mod I including shower transfer.   Baseline supervision-min A   Time 8   Period Weeks   Status Achieved  06/25/16  has performed once.  07/09/16  distant supervision-mod I (husband in the house)     OT LONG TERM GOAL #3   Title Pt will perform simple cold meal/snack prep and warm items in microwave mod I.   Time 8   Period Weeks   Status Achieved  06/28/16:  partially met.  pt is performing cold meal prep.  07/12/16  met     OT LONG TERM GOAL #4   Title Pt will improve R hand strength by at least 15lbs for incr ease with opening packages/containers.  (upgraded 06/28/16 to 15lb improvement)   Baseline 30lbs   Time 8   Period Weeks   Status Revised  07/12/16:  40lbs     OT LONG TERM GOAL #5   Title Pt will be able to feed dog mod I.   Time 8   Period Weeks   Status Achieved  06/25/16  per pt report     OT LONG TERM GOAL #6   Title Pt will verbalize understanding of memory compensation strategies for ADLs prn.   Time 8   Period Weeks   Status New               Plan - 07/16/16 8546    Clinical Impression Statement Pt is making progress toward goals with  improved strength, coordination, and activity tolerance. D/C planning has begun and should assess LTG's next visit.   Rehab Potential Good   Clinical Impairments Affecting Rehab Potential cognitive/memory deficits   OT Frequency 2x / week   OT Duration 8 weeks   OT Treatment/Interventions Self-care/ADL training;Cryotherapy;Parrafin;Therapeutic exercise;DME and/or AE instruction;Therapist, nutritional;Therapeutic activities;Patient/family education;Balance training;Cognitive remediation/compensation;Manual Therapy;Splinting;Neuromuscular education;Fluidtherapy;Ultrasound;Electrical Stimulation;Moist Heat;Energy conservation;Contrast Bath;Passive range of motion;Therapeutic exercises;Visual/perceptual remediation/compensation   Plan Review theraband exercises, assess LTG's and anticipate d/c end of this week.   Consulted and Agree with Plan of Care Patient      Patient will benefit from skilled therapeutic intervention in order to improve the following deficits and impairments:  Decreased balance, Decreased endurance, Decreased mobility, Decreased range of motion, Decreased knowledge of use of DME, Decreased strength, Impaired UE functional use, Impaired flexibility, Decreased activity tolerance, Decreased coordination, Decreased cognition  Visit Diagnosis: Muscle weakness (generalized)  Other lack of coordination  Stiffness of right shoulder, not elsewhere classified  Stiffness of left shoulder, not elsewhere classified  Weakness of both arms  Unsteadiness on feet  Attention and concentration deficit    Problem List Patient  Active Problem List   Diagnosis Date Noted  . Hematuria   . Urinary retention   . Slow transit constipation   . Yeast infection   . Acute lower UTI   . Other complicated headache syndrome   . Acute blood loss anemia   . Reactive hypertension   . Multiple sclerosis exacerbation ([place]) 04/24/2016  . Neurogenic bladder 04/24/2016  . Weakness of both  arms   . Weakness of both legs   . Sepsis ([place]) 04/21/2016  . Bilateral hydronephrosis 04/21/2016  . ARF (acute renal failure) ([place]) 04/21/2016  . Sepsis secondary to UTI ([place]) 04/20/2016  . Attention deficit disorder 02/23/2016  . Gait disturbance 11/24/2015  . Other fatigue 11/24/2015  . Urinary urgency 11/24/2015  . Dehydration   . UTI (lower urinary tract infection) 10/29/2015  . Leukocytosis 10/29/2015  . Nausea and vomiting 10/29/2015  . MS (multiple sclerosis) ([place]) 10/26/2014    Caidence Kaseman Ardath Sax, OTR/L 07/16/2016, 9:56 AM  [facility] [address] [place], [place], [address] Phone: [phone]   Fax:  [phone]  Name: Crystal Park MRN: [id number] Date of Birth: 1960-11-06

## 2016-07-19 ENCOUNTER — Ambulatory Visit: Payer: Managed Care, Other (non HMO) | Admitting: Occupational Therapy

## 2016-07-19 ENCOUNTER — Ambulatory Visit: Payer: Managed Care, Other (non HMO) | Admitting: Rehabilitation

## 2016-07-19 ENCOUNTER — Encounter: Payer: Self-pay | Admitting: Rehabilitation

## 2016-07-19 DIAGNOSIS — R2681 Unsteadiness on feet: Secondary | ICD-10-CM

## 2016-07-19 DIAGNOSIS — M6281 Muscle weakness (generalized): Secondary | ICD-10-CM | POA: Diagnosis not present

## 2016-07-19 DIAGNOSIS — M25611 Stiffness of right shoulder, not elsewhere classified: Secondary | ICD-10-CM

## 2016-07-19 DIAGNOSIS — R2689 Other abnormalities of gait and mobility: Secondary | ICD-10-CM

## 2016-07-19 DIAGNOSIS — R278 Other lack of coordination: Secondary | ICD-10-CM

## 2016-07-19 DIAGNOSIS — R41840 Attention and concentration deficit: Secondary | ICD-10-CM

## 2016-07-19 DIAGNOSIS — M25612 Stiffness of left shoulder, not elsewhere classified: Secondary | ICD-10-CM

## 2016-07-19 NOTE — Patient Instructions (Signed)
(  Exercise) Monday Tuesday Wednesday Thursday Friday Saturday Sunday   Coordination (cards, coins, ball, etc) & Putty           Arm exercises with weights           Band exercises for Arm

## 2016-07-19 NOTE — Patient Instructions (Addendum)
Bracing With Bridging (Hook-Lying)    With neutral spine, tighten pelvic floor and abdominals and hold. Lift bottom. Repeat __10_ times. Do _1_ times a day.  Add yellow band around knees.  Before lifting, spread legs and then lift.     Copyright  VHI. All rights reserved.  Hip Flexion / Knee Extension: Straight-Leg Raise (Eccentric)    Lie on back. Lift leg with knee straight. Slowly lower leg for 3-5 seconds. _7__ reps per set, __1_ sets per day, _5-7__ days per week. Lower like elevator, stopping at each floor.  Copyright  VHI. All rights reserved.  Abduction: Clam (Eccentric) - Side-Lying    Lie on side with knees bent. Lift top knee, keeping feet together. Keep trunk steady. Slowly lower for 3-5 seconds. _10__ reps per set, _1__ sets per day, _5-7_ days per week. Use yellow band when on your right side.   http://ecce.exer.us/65  Copyright  VHI. All rights reserved.     Tandem Walking    Walk with each foot directly in front of other, heel of one foot touching toes of other foot with each step. Both feet straight ahead. Walk along kitchen counter top for support as needed. Repeat forwards and backwards x 2 reps. Have Mardelle Matte place a chair at the end just in case you need to rest in between reps.    Copyright  VHI. All rights reserved.  Mini Squat: Double Leg    With feet shoulder width apart, reach forward for balance and do a mini squat. Keep knees in line with second toe. Knees do not go past toes. Have a chair behind you and think about aiming your bottom for the chair when you squat. Do about a 45 degree bend and then come back up.  Repeat _7__ times per set. Do 5-7 days per week.   http://plyo.exer.us/70  Copyright  VHI. All rights reserved.  Stand in corner with chair in front of you for safety.     Feet Apart, Arm Motion - Eyes Closed    With eyes closed and feet shoulder width apart, keep arms by your side.  Repeat  __3__ times per session for 20 secs. Do __2__ sessions per day.  Copyright  VHI. All rights reserved.   Feet Together, Arm Motion - Eyes Closed    With eyes closed and feet together, keep arms by your side.  Repeat __3__ times per session for 15-20 secs. Do __2__ sessions per day.  Can start with your feet mostly together, as you get more balanced, get your feet closer together.    Copyright  VHI. All rights reserved.   "I love a Parade" Lift    Using a chair or counter top if necessary, March in place with support as needed.  Do 10 times total.   Repeat __1__ times. Do __1-2__ sessions per day.  http://gt2.exer.us/345   Copyright  VHI. All rights reserved.

## 2016-07-19 NOTE — Therapy (Signed)
[facility] [address] [place], [place], [address] Phone: [phone]   Fax:  [phone]  Physical Therapy Treatment and D/C Summary   Patient Details  Name: Crystal Park MRN: [id number] Date of Birth: [birth year] Referring Provider: Lawerance Cruel, MD  Encounter Date: 07/19/2016      PT End of Session - 07/19/16 0853    Visit Number 15   Number of Visits 17   Date for PT Re-Evaluation 07/20/16   Authorization Type [employer] (60 visit limit for PT)   Authorization - Visit Number 15   Authorization - Number of Visits 60   PT Start Time 2330   PT Stop Time 0935   PT Time Calculation (min) 48 min   Activity Tolerance Patient limited by fatigue   Behavior During Therapy [facility] for tasks assessed/performed      Past Medical History:  Diagnosis Date  . Benign paroxysmal positional vertigo 10/12/2014  . MS (multiple sclerosis) ([place])    diagnosed 9/16    Past Surgical History:  Procedure Laterality Date  . OOPHORECTOMY Left    2013    There were no vitals filed for this visit.      Subjective Assessment - 07/19/16 0851    Subjective Reports walking in house this morning without cane.  PT recommends she still use cane in house.    Patient is accompained by: Family member   Limitations House hold activities;Walking   Patient Stated Goals "To regain strength and balance."    Currently in Pain? No/denies                         [facility] Adult PT Treatment/Exercise - 07/19/16 0001      Ambulation/Gait   Gait velocity 1.31 ft/sec with quad tip cane     Neuro Re-ed    Neuro Re-ed Details  Went through HEP for corner balance exercises-see pt instruction     Exercises   Other Exercises  Went over current HEP and made additions/modifications as able, see pt instruction for details on exercises and reps performed.                 PT Education - 07/19/16 [phone]    Education provided Yes    Education Details reviewed HEP and made single addition and another modifications, see pt instruction    Person(s) Educated Patient   Methods Explanation;Demonstration;Handout   Comprehension Verbalized understanding;Returned demonstration          PT Short Term Goals - 07/19/16 0853      PT SHORT TERM GOAL #1   Title Pt will perform initial HEP with mod I using paper handout to maximize progress made in PT. Target date for all STGs: 06/18/16   Baseline met    Time 4   Period Weeks   Status Achieved     PT SHORT TERM GOAL #2   Title Will assess BERG balance test and improve score by 3 points from baseline in order to indicate decreased fall risk.    Baseline 35/56 baseline, 44/56 on 06/21/16   Time 4   Period Weeks   Status Achieved     PT SHORT TERM GOAL #3   Title Pt will improve 5TSS to </=21 secs with single UE support only in order to indicate improved functional strength.    Baseline 18.25 secs without UE support 06/21/16   Time 4   Period Weeks   Status Achieved  PT SHORT TERM GOAL #4   Title Pt will improve TUG to </=32 secs w/ LRAD in order to indicate decreased fall risk.    Baseline 34.19 secs with RW, 44.07 secs with quad tip cane   Time 4   Period Weeks   Status Partially Met     PT SHORT TERM GOAL #5   Title Pt will improve gait speed to 1.75 ft/sec w/ LRAD in order to indicate decreased fall risk and improved efficiency of gait.    Baseline 1.13 ft/sec with RW on 06/21/16   Time 4   Period Weeks   Status Not Met     PT SHORT TERM GOAL #6   Title Pt will ambulate x 200' w/ LRAD over unlevel paved surfaces at mod I level in order to indicate return to community.    Baseline met 06/25/16   Time 4   Period Weeks   Status Achieved           PT Long Term Goals - 07/19/16 0930      PT LONG TERM GOAL #1   Title Pt will be independent with final HEP in order to indicate improved functional mobility and decreased fall risk.  (Target Date for all  LTGs: 07/16/16)   Baseline met 07/19/16   Time 8   Period Weeks   Status Achieved     PT LONG TERM GOAL #2   Title Pt will improve BERG balance score by 6 points from baseline in order to indicate decreased fall risk.     Baseline from 35 to 44/56, met at STG, 47/56 on 07/16/16   Time 8   Period Weeks   Status Achieved     PT LONG TERM GOAL #3   Title Pt will improve 5TSS to </=17 secs without UE support in order to indicate improved functional mobility.     Baseline 14.72 secs without UE support   Time 8   Period Weeks   Status Achieved     PT LONG TERM GOAL #4   Title Pt will perform TUG in </=28 secs w/ LRAD in order to indicate decreased fall risk.     Baseline met with both cane (quad tip) and RW, cane 20.49 and RW 20.78 secs   Time 8   Period Weeks   Status Achieved     PT LONG TERM GOAL #5   Title Pt will improve gait speed to 2.35 ft/sec in order to indicate decreased fall risk and improved efficiency of gait.    Baseline 1.31 ft/sec with cane (did not have time to assess RW) however would not want her to increase speed much more than this with cane due to safety concerns.    Time 8   Period Weeks   Status Not Met     PT LONG TERM GOAL #6   Title Pt will ambulate up to 150' over indoor surfaces with quad tip cane at S level in order to indicate improved functional independence.     Baseline met 07/19/16   Time 8   Period Weeks   Status Achieved               Plan - 07/19/16 6546    Clinical Impression Statement Pt has met 5/6 LTGs, not meeting gait speed goal.  Note that it was checked with quad tip cane as that is what she is using primarily.  For safety concerns, would not want her to increase her speed with cane.  Pt ready for DC.    Rehab Potential Good   Clinical Impairments Affecting Rehab Potential Progressive disease process   PT Treatment/Interventions ADLs/Self Care Home Management;Electrical Stimulation;DME Instruction;Gait training;Stair  training;Functional mobility training;Therapeutic activities;Therapeutic exercise;Balance training;Neuromuscular re-education;Patient/family education;Orthotic Fit/Training;Energy conservation;Vestibular;Visual/perceptual remediation/compensation   PT Next Visit Plan Continue LTGs and dc   Consulted and Agree with Plan of Care Patient      Patient will benefit from skilled therapeutic intervention in order to improve the following deficits and impairments:  Decreased activity tolerance, Decreased balance, Decreased coordination, Decreased endurance, Decreased mobility, Decreased strength, Dizziness, Increased edema, Impaired perceived functional ability, Impaired flexibility, Impaired sensation, Impaired UE functional use, Postural dysfunction  Visit Diagnosis: Muscle weakness (generalized)  Other abnormalities of gait and mobility  Unsteadiness on feet    PHYSICAL THERAPY DISCHARGE SUMMARY  Visits from Start of Care: 15  Current functional level related to goals / functional outcomes: See LTGs above   Remaining deficits: Pt mainly limited by fatigue and vestibular deficits, both directly related to MS.  She has made marked improvement with functional strength, endurance and balance during PT.    Education / Equipment: HEP  Plan: Patient agrees to discharge.  Patient goals were met. Patient is being discharged due to meeting the stated rehab goals.  ?????       Problem List Patient Active Problem List   Diagnosis Date Noted  . Hematuria   . Urinary retention   . Slow transit constipation   . Yeast infection   . Acute lower UTI   . Other complicated headache syndrome   . Acute blood loss anemia   . Reactive hypertension   . Multiple sclerosis exacerbation ([place]) 04/24/2016  . Neurogenic bladder 04/24/2016  . Weakness of both arms   . Weakness of both legs   . Sepsis ([place]) 04/21/2016  . Bilateral hydronephrosis 04/21/2016  . ARF (acute renal failure) ([place]) 04/21/2016   . Sepsis secondary to UTI ([place]) 04/20/2016  . Attention deficit disorder 02/23/2016  . Gait disturbance 11/24/2015  . Other fatigue 11/24/2015  . Urinary urgency 11/24/2015  . Dehydration   . UTI (lower urinary tract infection) 10/29/2015  . Leukocytosis 10/29/2015  . Nausea and vomiting 10/29/2015  . MS (multiple sclerosis) ([place]) 10/26/2014    Cameron Sprang, PT, MPT [facility] [address] [place], [place], [address] Phone: [phone]   Fax:  [phone] 07/19/16, 9:56 AM  Name: Crystal Park MRN: [id number] Date of Birth: 05-04-1960

## 2016-07-19 NOTE — Therapy (Signed)
[facility] [address] [place], [place], [address] Phone: [phone]   Fax:  [phone]  Occupational Therapy Treatment  Patient Details  Name: Georgina M Falletta MRN: [id number] Date of Birth: 09/01/1960 Referring Provider: Dr. Charles Ross  Encounter Date: 07/19/2016      OT End of Session - 07/19/16 0814    Visit Number 14   Number of Visits 17   Date for OT Re-Evaluation 07/20/16   Authorization Type [employer], 60 visit limit combined OT/ST, no auth needed   OT Start Time 0804   OT Stop Time 0845   OT Time Calculation (min) 41 min   Activity Tolerance Patient tolerated treatment well   Behavior During Therapy WFL for tasks assessed/performed      Past Medical History:  Diagnosis Date  . Benign paroxysmal positional vertigo 10/12/2014  . MS (multiple sclerosis) (HCC)    diagnosed 9/16    Past Surgical History:  Procedure Laterality Date  . OOPHORECTOMY Left    2013    There were no vitals filed for this visit.      Subjective Assessment - 07/19/16 0807    Subjective  Walked to bathroom and kitchen table today without cane unintentionally.  Made a sandwich yesterday.   Patient is accompained by: Family member   Pertinent History MS diagnosis 9/16 (but some symptoms in 2010)   Limitations urinary catheter, fall risk   Patient Stated Goals bathe herself (including transfer), simple cold/microwave meal prep, fold laundry, feed dog, dress mod I (including getting clothes out)   Currently in Pain? No/denies       Picking up blocks using gripper set on level 1 with black spring for sustained grip strength.  Pt able to complete all with no significant rest break and min difficulty/drops.  Placing grooved pegs in pegboard with R hand for coordination with incr time.   In standing functional reaching with RUE with LUE support  for incr activity tolerance, coordination, and balance.  (Placing medium pegs in  vertical pegboard x4 rows with rest after 2 rows).  Checked remaining goals and discussed progress.   Placing O'connor pegs in pegboard with tweezers with mod difficulty for incr strength/coordination.                 OT Education - 07/19/16 0822    Education Details Reviewed yellow theraband HEP; Exercise Chart for HEP    Person(s) Educated Patient   Methods Explanation;Demonstration;Verbal cues;Handout   Comprehension Verbalized understanding;Returned demonstration          OT Short Term Goals - 07/12/16 0819      OT SHORT TERM GOAL #1   Title Pt will be independent with initial HEP.--check STGs 06/20/16   Time 4   Period Weeks   Status Achieved  06/14/16     OT SHORT TERM GOAL #2   Title Pt will be able to fold laundry in sitting (with set-up).   Time 4   Period Weeks   Status Achieved  06/14/16     OT SHORT TERM GOAL #3   Title Pt will be able to retrieve clothing prior to dressing mod I.   Time 4   Period Weeks   Status Achieved  06/14/16     OT SHORT TERM GOAL #4   Title Pt will improve bilateral hand coordination for ADLs as shown by improving time on 9-hole peg test by at least 4 sec bilaterally.   Baseline R-34.85sec, L-33.56sec     Time 4   Period Weeks   Status Achieved  RUE 33.72 secs LUE 29.75- not fully met.  07/12/16:  R-28.91sec, L-29.44sec     OT SHORT TERM GOAL #5   Title Pt will improve R hand strength by at least 5lbs for incr ease with opening packages/containers.   Baseline R-30lbs   Time 4   Period Weeks   Status Achieved  40 lbs     OT SHORT TERM GOAL #6   Title Pt will verbalize understanding of energy conservation strategies and AE for incr safety and independence with ADLs/IADLs.   Time 4   Period Weeks   Status Achieved  needs reinforcement.  06/25/16 met           OT Long Term Goals - 07/19/16 0815      OT LONG TERM GOAL #1   Title Pt will be independent with updated HEP.--check LTGs 07/20/16   Time 8   Period  Weeks   Status Achieved  07/19/16     OT LONG TERM GOAL #2   Title Pt will perform bathing mod I including shower transfer.   Baseline supervision-min A   Time 8   Period Weeks   Status Achieved  06/25/16  has performed once.  07/09/16  distant supervision-mod I (husband in the house)     OT LONG TERM GOAL #3   Title Pt will perform simple cold meal/snack prep and warm items in microwave mod I.   Time 8   Period Weeks   Status Achieved  06/28/16:  partially met.  pt is performing cold meal prep.  07/12/16  met     OT LONG TERM GOAL #4   Title Pt will improve R hand strength by at least 15lbs for incr ease with opening packages/containers.  (upgraded 06/28/16 to 15lb improvement)   Baseline 30lbs   Time 8   Period Weeks   Status Not Met  07/12/16:  40lbs     OT LONG TERM GOAL #5   Title Pt will be able to feed dog mod I.   Time 8   Period Weeks   Status Achieved  06/25/16  per pt report     OT LONG TERM GOAL #6   Title Pt will verbalize understanding of memory compensation strategies for ADLs prn.   Time 8   Period Weeks   Status Achieved               Plan - 07/19/16 0815    Clinical Impression Statement Pt has made good progress with incr activity tolerance, strength, functional mobility, coordination, and ADL/IADL performance.     Rehab Potential Good   Clinical Impairments Affecting Rehab Potential cognitive/memory deficits   OT Frequency 2x / week   OT Duration 8 weeks   OT Treatment/Interventions Self-care/ADL training;Cryotherapy;Parrafin;Therapeutic exercise;DME and/or AE instruction;Functional Mobility Training;Therapeutic activities;Patient/family education;Balance training;Cognitive remediation/compensation;Manual Therapy;Splinting;Neuromuscular education;Fluidtherapy;Ultrasound;Electrical Stimulation;Moist Heat;Energy conservation;Contrast Bath;Passive range of motion;Therapeutic exercises;Visual/perceptual remediation/compensation   Plan d/c OT   OT Home  Exercise Plan Education provided:  UE strengthening HEP; coordination/red putty HEP 06/07/16; 07/12/16  yellow band HEP   Consulted and Agree with Plan of Care Patient      Patient will benefit from skilled therapeutic intervention in order to improve the following deficits and impairments:  Decreased balance, Decreased endurance, Decreased mobility, Decreased range of motion, Decreased knowledge of use of DME, Decreased strength, Impaired UE functional use, Impaired flexibility, Decreased activity tolerance, Decreased coordination, Decreased cognition  Visit Diagnosis: Muscle weakness (  generalized)  Other abnormalities of gait and mobility  Unsteadiness on feet  Stiffness of right shoulder, not elsewhere classified  Other lack of coordination  Stiffness of left shoulder, not elsewhere classified  Attention and concentration deficit    Problem List Patient Active Problem List   Diagnosis Date Noted  . Hematuria   . Urinary retention   . Slow transit constipation   . Yeast infection   . Acute lower UTI   . Other complicated headache syndrome   . Acute blood loss anemia   . Reactive hypertension   . Multiple sclerosis exacerbation (HCC) 04/24/2016  . Neurogenic bladder 04/24/2016  . Weakness of both arms   . Weakness of both legs   . Sepsis (HCC) 04/21/2016  . Bilateral hydronephrosis 04/21/2016  . ARF (acute renal failure) (HCC) 04/21/2016  . Sepsis secondary to UTI (HCC) 04/20/2016  . Attention deficit disorder 02/23/2016  . Gait disturbance 11/24/2015  . Other fatigue 11/24/2015  . Urinary urgency 11/24/2015  . Dehydration   . UTI (lower urinary tract infection) 10/29/2015  . Leukocytosis 10/29/2015  . Nausea and vomiting 10/29/2015  . MS (multiple sclerosis) (HCC) 10/26/2014    OCCUPATIONAL THERAPY DISCHARGE SUMMARY  Visits from Start of Care: 14  Current functional level related to goals / functional outcomes: See above   Remaining deficits: decr  strength/decr activity tolerance, decr coordination, decr functional mobility/balance for ADLs--all improved    Education / Equipment: Pt instructed in HEP, memory compensation strategies, energy conservation strategies.  Pt verbalized understanding of all education provided.  Plan: Patient agrees to discharge.  Patient goals were met. Patient is being discharged due to being pleased with the current functional level.  ?????    /     Name: Shoshana M Morris MRN: [id number] Date of Birth: 01/13/1961   Angela Freeman, OTR/L [facility] [address] [place], [place]  [address] [phone] phone [phone] 07/19/16 8:49 AM    

## 2016-09-04 ENCOUNTER — Encounter: Payer: Self-pay | Admitting: Hematology and Oncology

## 2016-09-04 ENCOUNTER — Telehealth: Payer: Self-pay | Admitting: Hematology and Oncology

## 2016-09-04 NOTE — Telephone Encounter (Signed)
Appt has been scheduled for the pt to see Dr. Pamelia Hoit on 7/25 at 345pm. Aware to arrive 30 minute early. Letter mailed to the pt and faxed to the referring.

## 2016-09-06 ENCOUNTER — Telehealth: Payer: Self-pay | Admitting: *Deleted

## 2016-09-06 NOTE — Telephone Encounter (Signed)
Disability forms completed and sent  up front to [employer] Records/fim

## 2016-09-11 ENCOUNTER — Telehealth: Payer: Self-pay | Admitting: *Deleted

## 2016-09-11 DIAGNOSIS — Z0289 Encounter for other administrative examinations: Secondary | ICD-10-CM

## 2016-09-11 NOTE — Telephone Encounter (Signed)
Pt [place] form faxed to [phone].

## 2016-09-24 ENCOUNTER — Telehealth: Payer: Self-pay | Admitting: *Deleted

## 2016-09-24 NOTE — Telephone Encounter (Signed)
Fax received from [place].  [employer] PA approved for dates 09/24/16 thru 09/24/17.  Request ID# [id number]

## 2016-09-24 NOTE — Telephone Encounter (Signed)
[employer] PA completed and faxed to [facility], fax# [phone].  Pt. has been on [employer] since her MS dx. in June 2017./fim

## 2016-09-26 ENCOUNTER — Ambulatory Visit (HOSPITAL_BASED_OUTPATIENT_CLINIC_OR_DEPARTMENT_OTHER): Payer: Managed Care, Other (non HMO) | Admitting: Hematology and Oncology

## 2016-09-26 ENCOUNTER — Encounter: Payer: Self-pay | Admitting: Hematology and Oncology

## 2016-09-26 DIAGNOSIS — D649 Anemia, unspecified: Secondary | ICD-10-CM | POA: Diagnosis not present

## 2016-09-26 NOTE — Assessment & Plan Note (Signed)
Patient has had previously normal hemoglobins in the 13-15 g. Since the hospitalizations in February her hemoglobin had dipped down to 9.7 and has since recovered to 10.7 Patient feels extremely fatigued.  Differential diagnosis: 1. Anemia due to chronic disease and inflammation 2. anemia due to renal dysfunction 3. Combined B-12 and iron deficiency anemias 4. Hemolysis 5. Hypothyroidism 6. Plasma cell disorders myeloma 7. Bone marrow dysfunction with MDS  Workup performed: 1. CBC with differential to evaluate the smear 2. CMP to evaluate liver and kidney function 3. Haptoglobin, LDH, reticulocyte count to evaluate hemolysis 4. TSH 5. SPEP 6. Iron and Y-43 and folic acid levels  Patient has multiple sclerosis and has chronic generalized weakness. She has new-onset fatigue which appears to be constant.  If the above blood work remains nondiagnostic, then we may have to evaluate bone marrow biopsy if her hemoglobin drops below 10 g.

## 2016-09-26 NOTE — Progress Notes (Signed)
[facility] NOTE  Patient Care Team: Lawerance Cruel, MD as PCP - General (Family Medicine)  CHIEF COMPLAINTS/PURPOSE OF CONSULTATION:  Normocytic anemia  HISTORY OF PRESENTING ILLNESS:  Crystal Park 56 y.o. female is here because of recent diagnosis of normocytic anemia. Patient has multiple sclerosis which is primary progressive type and has Recurrent her legs. She also has trouble with urination. Recently she was admitted in February for urinary tract infection and developed anemia that time. She had to be readmitted. Since he hospitalizations patient has been progressively more fatigued. She was brought in by her husband.  I reviewed her records extensively and collaborated the history with the patient.  MEDICAL HISTORY:  Past Medical History:  Diagnosis Date  . Benign paroxysmal positional vertigo 10/12/2014  . MS (multiple sclerosis) ([facility])    diagnosed 9/16    SURGICAL HISTORY: Past Surgical History:  Procedure Laterality Date  . OOPHORECTOMY Left    2013    SOCIAL HISTORY: Social History   Social History  . Marital status: Married    Spouse name: N/A  . Number of children: N/A  . Years of education: N/A   Occupational History  . Not on file.   Social History Main Topics  . Smoking status: Never Smoker  . Smokeless tobacco: Never Used  . Alcohol use No  . Drug use: No  . Sexual activity: Not Currently   Other Topics Concern  . Not on file   Social History Narrative  . No narrative on file    FAMILY HISTORY: No family history on file.  ALLERGIES:  has No Known Allergies.  MEDICATIONS:  Current Outpatient Prescriptions  Medication Sig Dispense Refill  . cholecalciferol (VITAMIN D) 1000 UNITS tablet Take 1,000 Units by mouth daily. otc vit. d 5,000iu daily/fim    . CRANBERRY PO Take 1 capsule by mouth daily.    . diazepam (VALIUM) 5 MG tablet Take 1 tablet (5 mg total) by mouth every 12 (twelve) hours as needed for  anxiety (for dizziness also). 60 tablet 5  . senna (SENOKOT) 8.6 MG TABS tablet Take 1 tablet by mouth daily.    . TECFIDERA 240 MG CPDR TAKE 1 CAPSULE BY MOUTH TWICE DAILY 60 capsule 10   No current facility-administered medications for this visit.    Facility-Administered Medications Ordered in Other Visits  Medication Dose Route Frequency Provider Last Rate Last Dose  . gadopentetate dimeglumine (MAGNEVIST) injection 11 mL  11 mL Intravenous Once PRN Sater, Richard A, MD      . gadopentetate dimeglumine (MAGNEVIST) injection 12 mL  12 mL Intravenous Once PRN Sater, Nanine Means, MD        REVIEW OF SYSTEMS:   Constitutional: Denies fevers, chills or abnormal night sweats Eyes: Denies blurriness of vision, double vision or watery eyes Ears, nose, mouth, throat, and face: Denies mucositis or sore throat Respiratory: Denies cough, dyspnea or wheezes Cardiovascular: Denies palpitation, chest discomfort or lower extremity swelling Gastrointestinal:  Denies nausea, heartburn or change in bowel habits Skin: Denies abnormal skin rashes Lymphatics: Denies new lymphadenopathy or easy bruising Neurological:Generalized lower extremity weakness from multiple sclerosis, uses a cane to walk Behavioral/Psych: Mood is stable, no new changes  All other systems were reviewed with the patient and are negative.  PHYSICAL EXAMINATION: ECOG PERFORMANCE STATUS: 2 - Symptomatic, <50% confined to bed  Vitals:   09/26/16 1548  BP: 117/73  Pulse: 72  Resp: 18  Temp: 97.9 F (36.6 C)  Filed Weights   09/26/16 1548  Weight: 124 lb 6.4 oz (56.4 kg)    GENERAL:alert, no distress and comfortable SKIN: skin color, texture, turgor are normal, no rashes or significant lesions EYES: normal, conjunctiva are pink and non-injected, sclera clear OROPHARYNX:no exudate, no erythema and lips, buccal mucosa, and tongue normal  NECK: supple, thyroid normal size, non-tender, without nodularity LYMPH:  no palpable  lymphadenopathy in the cervical, axillary or inguinal LUNGS: clear to auscultation and percussion with normal breathing effort HEART: regular rate & rhythm and no murmurs and no lower extremity edema ABDOMEN:abdomen soft, non-tender and normal bowel sounds Musculoskeletal:no cyanosis of digits and no clubbing  PSYCH: alert & oriented x 3 with fluent speech NEURO: no focal motor/sensory deficits  LABORATORY DATA:  I have reviewed the data as listed Lab Results  Component Value Date   WBC 9.3 05/03/2016   HGB 10.7 (L) 05/03/2016   HCT 33.6 (L) 05/03/2016   MCV 87.7 05/03/2016   PLT 602 (H) 05/03/2016   Lab Results  Component Value Date   NA 138 04/25/2016   K 3.7 04/25/2016   CL 101 04/25/2016   CO2 26 04/25/2016    RADIOGRAPHIC STUDIES: I have personally reviewed the radiological reports and agreed with the findings in the report.  ASSESSMENT AND PLAN:  Normocytic anemia Patient has had previously normal hemoglobins in the 13-15 g. Since the hospitalizations in February her hemoglobin had dipped down to 9.7 and has since recovered to 10.7 Patient feels extremely fatigued.  Differential diagnosis: 1. Anemia due to chronic disease and inflammation 2. anemia due to renal dysfunction 3. Combined B-12 and iron deficiency anemias 4. Hemolysis 5. Hypothyroidism 6. Plasma cell disorders myeloma 7. Bone marrow dysfunction with MDS  Workup performed: 1. CBC with differential to evaluate the smear 2. CMP to evaluate liver and kidney function 3. Haptoglobin, LDH, reticulocyte count to evaluate hemolysis 4. TSH 5. SPEP 6. Iron and S-93 and folic acid levels  Patient has multiple sclerosis and has chronic generalized weakness. She has new-onset fatigue which appears to be constant.  If the above blood work remains nondiagnostic, then we may have to evaluate bone marrow biopsy if her hemoglobin drops below 10 g.  Return to clinic in one week for review of the blood work  All  questions were answered. The patient knows to call the clinic with any problems, questions or concerns.    Rulon Eisenmenger, MD 09/26/16

## 2016-09-27 ENCOUNTER — Other Ambulatory Visit (HOSPITAL_BASED_OUTPATIENT_CLINIC_OR_DEPARTMENT_OTHER): Payer: Managed Care, Other (non HMO)

## 2016-09-27 DIAGNOSIS — R112 Nausea with vomiting, unspecified: Secondary | ICD-10-CM | POA: Diagnosis not present

## 2016-09-27 DIAGNOSIS — R319 Hematuria, unspecified: Secondary | ICD-10-CM

## 2016-09-27 DIAGNOSIS — D649 Anemia, unspecified: Secondary | ICD-10-CM

## 2016-09-27 DIAGNOSIS — E860 Dehydration: Secondary | ICD-10-CM

## 2016-09-27 DIAGNOSIS — D72829 Elevated white blood cell count, unspecified: Secondary | ICD-10-CM | POA: Diagnosis not present

## 2016-09-27 LAB — CBC & DIFF AND RETIC
BASO%: 0.8 % (ref 0.0–2.0)
Basophils Absolute: 0 10*3/uL (ref 0.0–0.1)
EOS%: 3.3 % (ref 0.0–7.0)
Eosinophils Absolute: 0.2 10*3/uL (ref 0.0–0.5)
HCT: 42.6 % (ref 34.8–46.6)
HGB: 14.1 g/dL (ref 11.6–15.9)
Immature Retic Fract: 3 % (ref 1.60–10.00)
LYMPH%: 24.1 % (ref 14.0–49.7)
MCH: 31.3 pg (ref 25.1–34.0)
MCHC: 33.1 g/dL (ref 31.5–36.0)
MCV: 94.5 fL (ref 79.5–101.0)
MONO#: 0.7 10*3/uL (ref 0.1–0.9)
MONO%: 13.9 % (ref 0.0–14.0)
NEUT#: 2.8 10*3/uL (ref 1.5–6.5)
NEUT%: 57.9 % (ref 38.4–76.8)
Platelets: 330 10*3/uL (ref 145–400)
RBC: 4.51 10*6/uL (ref 3.70–5.45)
RDW: 14.3 % (ref 11.2–14.5)
Retic %: 0.86 % (ref 0.70–2.10)
Retic Ct Abs: 38.79 10*3/uL (ref 33.70–90.70)
WBC: 4.9 10*3/uL (ref 3.9–10.3)
lymph#: 1.2 10*3/uL (ref 0.9–3.3)

## 2016-09-27 LAB — IRON AND TIBC
%SAT: 20 % — ABNORMAL LOW (ref 21–57)
Iron: 76 ug/dL (ref 41–142)
TIBC: 384 ug/dL (ref 236–444)
UIBC: 308 ug/dL (ref 120–384)

## 2016-09-27 LAB — TSH: TSH: 1.692 m(IU)/L (ref 0.308–3.960)

## 2016-09-27 LAB — LACTATE DEHYDROGENASE: LDH: 141 U/L (ref 125–245)

## 2016-09-27 LAB — FERRITIN: Ferritin: 40 ng/ml (ref 9–269)

## 2016-09-28 LAB — FOLATE: Folate: >20 ng/mL (ref >3.0)

## 2016-09-28 LAB — HAPTOGLOBIN: Haptoglobin: 77 mg/dL (ref 34–200)

## 2016-09-28 LAB — VITAMIN B12: Vitamin B12: 579 pg/mL (ref 232–1245)

## 2016-09-28 LAB — ERYTHROPOIETIN: Erythropoietin: 6.1 m[IU]/mL (ref 2.6–18.5)

## 2016-10-01 ENCOUNTER — Telehealth: Payer: Self-pay | Admitting: *Deleted

## 2016-10-01 LAB — MULTIPLE MYELOMA PANEL, SERUM
Albumin SerPl Elph-Mcnc: 4.3 g/dL (ref 2.9–4.4)
Albumin/Glob SerPl: 1.5 (ref 0.7–1.7)
Alpha 1: 0.3 g/dL (ref 0.0–0.4)
Alpha2 Glob SerPl Elph-Mcnc: 0.7 g/dL (ref 0.4–1.0)
B-Globulin SerPl Elph-Mcnc: 1.1 g/dL (ref 0.7–1.3)
Gamma Glob SerPl Elph-Mcnc: 1 g/dL (ref 0.4–1.8)
Globulin, Total: 3 g/dL (ref 2.2–3.9)
IgA, Qn, Serum: 169 mg/dL (ref 87–352)
IgG, Qn, Serum: 938 mg/dL (ref 700–1600)
IgM, Qn, Serum: 118 mg/dL (ref 26–217)
Total Protein: 7.3 g/dL (ref 6.0–8.5)

## 2016-10-01 NOTE — Telephone Encounter (Signed)
Disability paperwork completed and faxed to [facility], fax# [phone].  Copy mailed to pt.  Copy sent to be scanned into EPIC/fim

## 2016-10-04 ENCOUNTER — Encounter: Payer: Self-pay | Admitting: Hematology and Oncology

## 2016-10-04 ENCOUNTER — Ambulatory Visit (HOSPITAL_BASED_OUTPATIENT_CLINIC_OR_DEPARTMENT_OTHER): Payer: Managed Care, Other (non HMO) | Admitting: Hematology and Oncology

## 2016-10-04 DIAGNOSIS — D649 Anemia, unspecified: Secondary | ICD-10-CM

## 2016-10-04 NOTE — Progress Notes (Signed)
Patient Care Team: Daisy Floro, MD as PCP - General (Family Medicine)  DIAGNOSIS:  Encounter Diagnosis  Name Primary?  . Normocytic anemia     CHIEF COMPLIANT: follow-up of normocytic anemia  INTERVAL HISTORY: Crystal Park is a 56 year old with above-mentioned history of normocytic anemia who is hemoglobin had decreased significantly previously and was sent to me for evaluation. Upon my assessment she did not have any evidence of anemia as she had full normalization of her hemoglobin 14.1 g.she is here to discuss the lab work report.  REVIEW OF SYSTEMS:   Constitutional: Denies fevers, chills or abnormal weight loss Eyes: Denies blurriness of vision Ears, nose, mouth, throat, and face: Denies mucositis or sore throat Respiratory: Denies cough, dyspnea or wheezes Cardiovascular: Denies palpitation, chest discomfort Gastrointestinal:  Denies nausea, heartburn or change in bowel habits Skin: Denies abnormal skin rashes Lymphatics: Denies new lymphadenopathy or easy bruising Neurological:Denies numbness, tingling or new weaknesses Behavioral/Psych: Mood is stable, no new changes  Extremities: No lower extremity edema All other systems were reviewed with the patient and are negative.  I have reviewed the past medical history, past surgical history, social history and family history with the patient and they are unchanged from previous note.  ALLERGIES:  has No Known Allergies.  MEDICATIONS:  Current Outpatient Prescriptions  Medication Sig Dispense Refill  . cholecalciferol (VITAMIN D) 1000 UNITS tablet Take 1,000 Units by mouth daily. otc vit. d 5,000iu daily/fim    . CRANBERRY PO Take 1 capsule by mouth daily.    . diazepam (VALIUM) 5 MG tablet Take 1 tablet (5 mg total) by mouth every 12 (twelve) hours as needed for anxiety (for dizziness also). 60 tablet 5  . senna (SENOKOT) 8.6 MG TABS tablet Take 1 tablet by mouth daily.    . TECFIDERA 240 MG CPDR TAKE 1  CAPSULE BY MOUTH TWICE DAILY 60 capsule 10   No current facility-administered medications for this visit.    Facility-Administered Medications Ordered in Other Visits  Medication Dose Route Frequency Provider Last Rate Last Dose  . gadopentetate dimeglumine (MAGNEVIST) injection 11 mL  11 mL Intravenous Once PRN Sater, Richard A, MD      . gadopentetate dimeglumine (MAGNEVIST) injection 12 mL  12 mL Intravenous Once PRN Sater, Pearletha Furl, MD        PHYSICAL EXAMINATION: ECOG PERFORMANCE STATUS: 0 - Asymptomatic  There were no vitals filed for this visit. There were no vitals filed for this visit.  GENERAL:alert, no distress and comfortable SKIN: skin color, texture, turgor are normal, no rashes or significant lesions EYES: normal, Conjunctiva are pink and non-injected, sclera clear OROPHARYNX:no exudate, no erythema and lips, buccal mucosa, and tongue normal  NECK: supple, thyroid normal size, non-tender, without nodularity LYMPH:  no palpable lymphadenopathy in the cervical, axillary or inguinal LUNGS: clear to auscultation and percussion with normal breathing effort HEART: regular rate & rhythm and no murmurs and no lower extremity edema ABDOMEN:abdomen soft, non-tender and normal bowel sounds MUSCULOSKELETAL:no cyanosis of digits and no clubbing  NEURO: alert & oriented x 3 with fluent speech, no focal motor/sensory deficits EXTREMITIES: No lower extremity edema BREAST: No palpable masses or nodules in either right or left breasts. No palpable axillary supraclavicular or infraclavicular adenopathy no breast tenderness or nipple discharge. (exam performed in the presence of a chaperone)  LABORATORY DATA:  I have reviewed the data as listed   Chemistry      Component Value Date/Time   NA 138 04/25/2016  1610   NA 145 (H) 04/26/2015 0926   K 3.7 04/25/2016 0609   CL 101 04/25/2016 0609   CO2 26 04/25/2016 0609   BUN 15 04/25/2016 0609   BUN 14 04/26/2015 0926   CREATININE 0.56  04/25/2016 0609      Component Value Date/Time   CALCIUM 8.7 (L) 04/25/2016 0609   ALKPHOS 65 04/25/2016 0609   AST 15 04/25/2016 0609   ALT 10 (L) 04/25/2016 0609   BILITOT 0.3 04/25/2016 0609   BILITOT 0.6 04/26/2015 0926       Lab Results  Component Value Date   WBC 4.9 09/27/2016   HGB 14.1 09/27/2016   HCT 42.6 09/27/2016   MCV 94.5 09/27/2016   PLT 330 09/27/2016   NEUTROABS 2.8 09/27/2016    ASSESSMENT & PLAN:  Normocytic anemia Patient has had previously normal hemoglobins in the 13-15 g. Since the hospitalizations in February her hemoglobin had dipped down to 9.7 and has since recovered to 10.7  On 09/27/2016: Hemoglobin was 14.1 without any other blood abnormalities. Erythropoietin: 6.1 normal Ferritin 40 iron saturation 20% Folic acid greater than 20, vitamin B-12 579; LDH 141,  M protein not observed TSH 1.69  Since all the blood tests came back normal, we can see her on an as-needed basis.  I spent 15 minutes talking to the patient of which more than half was spent in counseling and coordination of care.  No orders of the defined types were placed in this encounter.  The patient has a good understanding of the overall plan. she agrees with it. she will call with any problems that may develop before the next visit here.   Crystal Sous, MD 10/04/16

## 2016-10-04 NOTE — Assessment & Plan Note (Signed)
Patient has had previously normal hemoglobins in the 13-15 g. Since the hospitalizations in February her hemoglobin had dipped down to 9.7 and has since recovered to 10.7 On 09/27/2016: Hemoglobin was 14.1 without any other blood abnormalities. Erythropoietinit's 0.1 normal Ferritin 40 iron saturation 20% Folic acid greater than 20, vitamin B-12 579LDH 141, M protein not observed TSH 1.69  Visit is no further issues with anemia and all the blood tests came back normal, there is no need for her to follow-up with [place].  We can see her on an as-needed basis.

## 2016-10-17 ENCOUNTER — Other Ambulatory Visit: Payer: Self-pay | Admitting: Neurology

## 2016-11-27 ENCOUNTER — Other Ambulatory Visit: Payer: Self-pay | Admitting: Obstetrics and Gynecology

## 2016-11-28 ENCOUNTER — Other Ambulatory Visit: Payer: Self-pay | Admitting: Obstetrics and Gynecology

## 2016-11-28 DIAGNOSIS — Z9189 Other specified personal risk factors, not elsewhere classified: Secondary | ICD-10-CM

## 2016-11-28 DIAGNOSIS — Z1509 Genetic susceptibility to other malignant neoplasm: Secondary | ICD-10-CM

## 2016-11-28 DIAGNOSIS — Z1501 Genetic susceptibility to malignant neoplasm of breast: Secondary | ICD-10-CM

## 2016-12-03 ENCOUNTER — Encounter: Payer: Self-pay | Admitting: Neurology

## 2016-12-03 ENCOUNTER — Ambulatory Visit (INDEPENDENT_AMBULATORY_CARE_PROVIDER_SITE_OTHER): Payer: Managed Care, Other (non HMO) | Admitting: Neurology

## 2016-12-03 VITALS — BP 114/76 | HR 73 | Resp 14 | Ht 62.0 in | Wt 121.5 lb

## 2016-12-03 DIAGNOSIS — R29898 Other symptoms and signs involving the musculoskeletal system: Secondary | ICD-10-CM

## 2016-12-03 DIAGNOSIS — R269 Unspecified abnormalities of gait and mobility: Secondary | ICD-10-CM | POA: Diagnosis not present

## 2016-12-03 DIAGNOSIS — N319 Neuromuscular dysfunction of bladder, unspecified: Secondary | ICD-10-CM

## 2016-12-03 DIAGNOSIS — R5383 Other fatigue: Secondary | ICD-10-CM

## 2016-12-03 DIAGNOSIS — G35 Multiple sclerosis: Secondary | ICD-10-CM | POA: Diagnosis not present

## 2016-12-03 IMAGING — DX DG CHEST 1V PORT
1 series · 1 of 1 positions shown · non-contrast
Comparison: None.

CLINICAL DATA: Chest soreness

EXAM:
PORTABLE CHEST 1 VIEW

[chest ap]
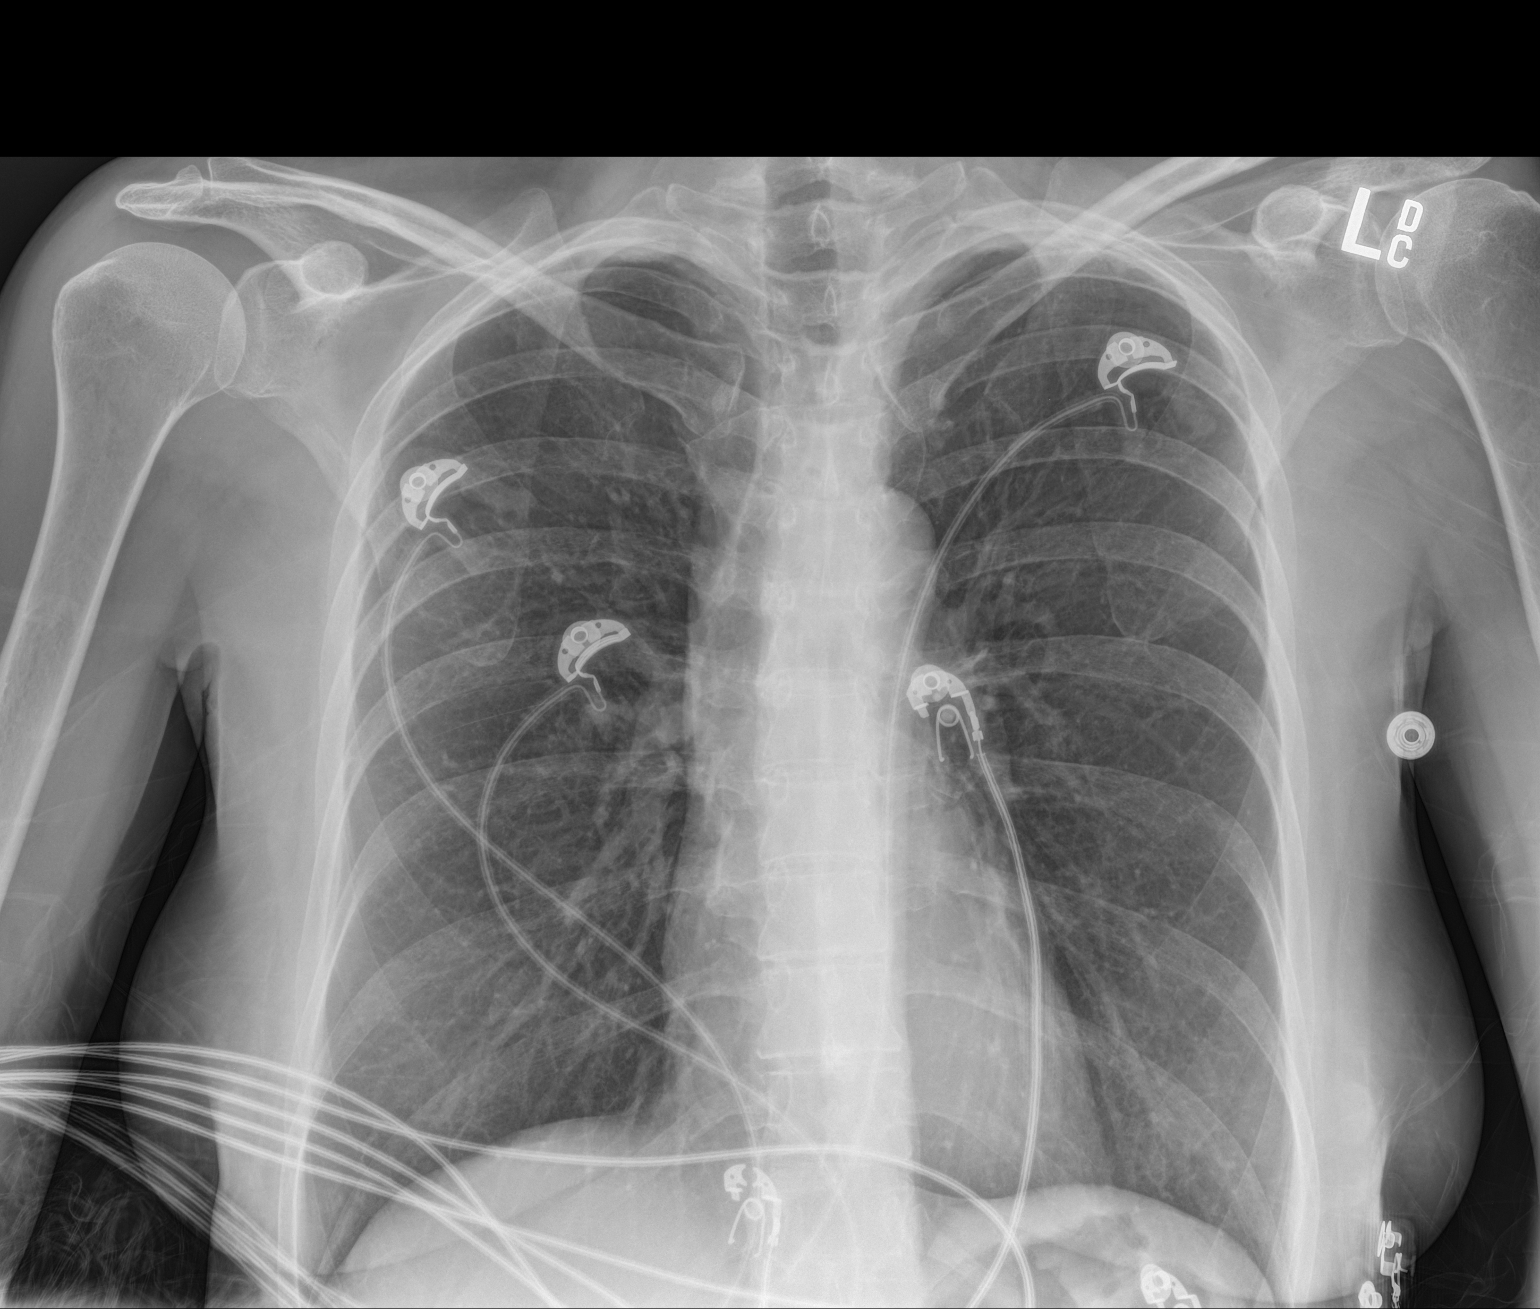

[1 of 1 positions shown; findings below may reference images not displayed]

FINDINGS: The heart size and mediastinal contours are within normal limits.
Both lungs are clear. The visualized skeletal structures are
unremarkable.
IMPRESSION: No active disease.

## 2016-12-03 MED ORDER — DIAZEPAM 5 MG PO TABS: ORAL_TABLET | ORAL | 5 refills | Status: DC

## 2016-12-03 MED ORDER — ARMODAFINIL 250 MG PO TABS: ORAL_TABLET | ORAL | 5 refills | Status: DC

## 2016-12-03 NOTE — Progress Notes (Signed)
='''''''''''''''''''''''''''''''''''''''''''''''''''''''''''''''''''''''''''''''''''''''''''''''''''''''''''''''''''''''''''''''''''''''''''''''''''''''''''''''''''''''''''''''''''''''''''''''''''''''''''''''''''''''''''''''''''''''''''''''''''''''''''''''''''''''''''''''''''''''''''''''''''''''''''''''''''''''''''''''''''''''''''''''''''''''''''''''''''''''''''''''''''''''''''''''''''''''''''''''''''''''''''''''''''''''''''''''''''''''''''''''''''''''''''''''''''''''''''''''''''''''''''''''''''''''''''''''''''''''''''''''''''''''''''''''''''''''''''''''''''''''''''''''pomj.   [facility]  PATIENT: Crystal Park DOB: 1960-05-07  REFERRING DOCTOR OR PCP:  Crystal Park (ENT referred)   Crystal Park (PCP) SOURCE: paitent and records from Dr. Ezzard Park.    CT images on PACS  _________________________________   HISTORICAL  CHIEF COMPLAINT:  Chief Complaint  Patient presents with  . Multiple Sclerosis    Sts. she continues to tolerate Tecfidera well.  Feels dizziness, fatigue and balance are worse. 2 falls since last ov./fim    HISTORY OF PRESENT ILLNESS:  Crystal Park is Park 56 year old woman with multiple sclerosis.      MS:   She is on Tecfidera tolerates it well. She has not had any definite exacerbation.   However, fatigue s much worse.  Brain and spine MRIs did not show any new lesions in Feb 2018.    Gait/strength/sensation:  She uses Park cane mostly due to poor balance and right leg weakness/spasticity.   She uses Park walker if more tired..   She notes right weakness and clumsiness.  She feels the leg is doing better than earlier this year.   Her right arm is doing better than earlier this year.     Ampyra did not help so she stopped.     Vertigo:   She reports bouts of vertigo, helped by valium (takes twice most days, occ tid)    In the past, she saw ENT and had ENG testing. It showed Park central vertigo. Parenteral and other exercises slightly helped. Valium helps  more.  Vision:   She denies any difficulty with her vision.   Bladder:   She had Park recent UTI and nitrofurantoin was started but she couldn't tolerate it well due to nausea.   She is doing catherizes 6 times Park day and that is well tolerated.   She is now seeing urology (sees Dr. Junius Park at [facility])  She had urosepsis in February 2018.   She is on oxybutynin for bladder spasms.   She is not have any incontinence with this regimen  Fatigue/sleep:   Her fatigue is worse(mental more than physical).   We tried amantadine but it caused edema and she stopped.   Adderall was not well tolerated.    She wakes up once or twice Park night because of her bladder..  Mood/Cognitive:   She denies any depression or anxiety. Sometimes she feels Park little bit more irritable and afternoons.  She has not noted any major cognitive issues  MS History:   About 2010, she had an event with vertigo and diplopia that took 6 months to improve.  After that she began to have issues with her legs.   I have reviewed the MRI of the brain and the spinal cord.   The MRI of the spine shows several T2 weighted lesions, some peripherally located consistent with multiple sclerosis. None of the foci enhanced. The MRI of the brain shows multiple foci in the periventricular, juxtacortical cortical and deep white matter consistent with multiple sclerosis.   There is also Park left cerebellar focus that could represent remote stroke (it is also present on CT scan from 2010)    REVIEW OF SYSTEMS: Constitutional: No fevers, chills, sweats, or change in appetite.   She notes fatigue Eyes: No visual changes, double vision, eye pain Ear, nose and throat: No hearing loss, ear pain, nasal congestion, sore throat.   Has vertigo Cardiovascular: No chest pain, palpitations Respiratory: No shortness of breath at rest or with exertion.  No wheezes GastrointestinaI: No nausea, vomiting, diarrhea, abdominal pain, fecal incontinence Genitourinary: As above.  No UTIs. Musculoskeletal: No neck pain, back pain Integumentary: No rash, pruritus, skin lesions Neurological: as above Psychiatric: No depression at this time.  Mild anxiety Endocrine: No palpitations, diaphoresis, change in appetite, change in weigh or increased thirst Hematologic/Lymphatic: No anemia, purpura, petechiae. Allergic/Immunologic: No itchy/runny eyes, nasal congestion, recent allergic reactions, rashes  ALLERGIES: No Known Allergies  HOME MEDICATIONS:  Current Outpatient Prescriptions:  .  cholecalciferol (VITAMIN D) 1000 UNITS tablet, Take 1,000 Units by mouth daily. otc vit. d 5,000iu daily/fim, Disp: , Rfl:  .  CRANBERRY PO, Take 1 capsule by mouth daily., Disp: , Rfl:  .  diazepam (VALIUM) 5 MG tablet, Take up to three times Park day po prn dizziness, Disp: 90 tablet, Rfl: 5 .  oxybutynin (DITROPAN) 5 MG tablet, , Disp: , Rfl:  .  senna (SENOKOT) 8.6 MG TABS tablet, Take 1 tablet by mouth daily., Disp: , Rfl:  .  TECFIDERA 240 MG CPDR, TAKE 1 CAPSULE BY MOUTH TWICE DAILY, Disp: 60 capsule, Rfl: 11 .  Armodafinil (NUVIGIL) 250 MG tablet, 1/2 to 1 po every morning po, Disp: 30 tablet, Rfl: 5 No current facility-administered medications for this visit.   Facility-Administered Medications Ordered in Other Visits:  .  gadopentetate dimeglumine (MAGNEVIST) injection 11 mL, 11 mL, Intravenous, Once PRN, Crystal Weiskopf A, MD .  gadopentetate dimeglumine (MAGNEVIST) injection 12 mL, 12 mL, Intravenous, Once PRN, Crystal Park, Pearletha Furl, MD  PAST MEDICAL HISTORY: Past Medical History:  Diagnosis Date  . Benign paroxysmal positional vertigo 10/12/2014  . MS (multiple sclerosis) (HCC)    diagnosed 9/16    PAST SURGICAL HISTORY: Past Surgical History:  Procedure Laterality Date  . OOPHORECTOMY Left    2013    FAMILY HISTORY: No family history on file.  SOCIAL HISTORY:  Social History   Social History  . Marital status: Married    Spouse name: N/Park  . Number of  children: N/Park  . Years of education: N/Park   Occupational History  . Not on file.   Social History Main Topics  . Smoking status: Never Smoker  . Smokeless tobacco: Never Used  . Alcohol use No  . Drug use: No  . Sexual activity: Not Currently   Other Topics Concern  . Not on file   Social History Narrative  . No narrative on file     PHYSICAL EXAM  Vitals:   12/03/16 1304  BP: 114/76  Pulse: 73  Resp: 14  Weight: 121 lb 8 oz (55.1 kg)  Height:  (1.575 m)    Body mass index is 22.22 kg/m.   General: The patient is well-developed and well-nourished and in no acute distress.   Hearing is symmetric.   Right > left ankle edema.  Neurologic Exam  Mental status: The patient is alert and oriented x 3 at the time of the examination. The patient has apparent normal recent and remote memory, with an apparently normal attention span and concentration ability.   Speech is normal.  Cranial nerves: Extraocular movements shows right nystagmus to right gaze.Marland Kitchen   Decreased color vision OS.   There is good facial sensation to soft touch bilaterally.Facial strength is normal.  Trapezius and sternocleidomastoid strength is normal. No dysarthria is noted.    No obvious hearing deficits are noted.  Motor:  Muscle bulk is normal.   Tone is mildly increased in right leg. Strength is  5 /  5 in the arms and 4+/5 in left leg and 4-/5 in right leg.   Sensory: She has normal sensation to touch and vibration in the arms and normal sensation to touch in the legs. Vibration sensation is mildly reduced in the legs.  Coordination: Cerebellar testing reveals good finger-nose-finger and reduced heel-to-shin bilaterally, worse on right.  Gait and station: Station is normal.   Gait requires Park cane .   Her foot drop is better.   Romberg is negative.   Reflexes: Deep tendon reflexes are symmetric and 3+ bilaterally  and increased in the knees (with spread).  No ankle clonus.Marland Kitchen       DIAGNOSTIC DATA  (LABS, IMAGING, TESTING) - I reviewed patient records, labs, notes, testing and imaging myself where available.     ASSESSMENT AND PLAN  MS (multiple sclerosis) (HCC)  Gait disturbance  Other fatigue  Neurogenic bladder  Weakness of both legs   1.   She will continue Tecfidera. She has recent blood work 6 does not need any at this time. 2.    Bladder issues per urology..    3.   Continue valium for Vertigo.   He had Nuvigil for fatigue. If this does not help, we can go back to amantadine. 4.   Stay active and exercises tolerated.    5.   I will see her back 5 months or sooner if she has new or worsening symptoms.    Daril Warga Park. Epimenio Foot, MD, PhD 12/03/2016, 1:37 PM Certified in Neurology, Clinical Neurophysiology, Sleep Medicine, Pain Medicine and Neuroimaging  [facility] [address] [place], [place] [address] [phone]

## 2016-12-04 ENCOUNTER — Telehealth: Payer: Self-pay | Admitting: Neurology

## 2016-12-04 MED ORDER — DIAZEPAM 5 MG PO TABS: ORAL_TABLET | ORAL | 1 refills | Status: DC

## 2016-12-04 NOTE — Addendum Note (Signed)
Addended by: Candis Schatz I on: 12/04/2016 02:52 PM   Modules accepted: Orders

## 2016-12-04 NOTE — Telephone Encounter (Signed)
Patient's husband calling to get a Rx for diazepam (VALIUM) 5 MG tablet called to [facility]. Patient was given written Rx yesterday but needs it sent to mail order.

## 2016-12-04 NOTE — Telephone Encounter (Signed)
Diazepam faxed to [facility] as requested/fim

## 2016-12-05 ENCOUNTER — Telehealth: Payer: Self-pay | Admitting: *Deleted

## 2016-12-05 NOTE — Telephone Encounter (Signed)
PA for Armodafinil 250mg  #30/30 completed via Cover My Meds.  Dx: R53.83 (fatigue) Key# [id number]

## 2016-12-10 NOTE — Telephone Encounter (Signed)
Fax received from [facility].  Armodafinil PA approved for dates 12/07/16 thru 12/07/17.  Request ID: [id number]

## 2016-12-15 ENCOUNTER — Ambulatory Visit
Admission: RE | Admit: 2016-12-15 | Discharge: 2016-12-15 | Disposition: A | Discharge status: Home / Self Care | Payer: Managed Care, Other (non HMO) | Source: Ambulatory Visit | Attending: Obstetrics and Gynecology | Admitting: Obstetrics and Gynecology

## 2016-12-15 DIAGNOSIS — Z15060 Genetic susceptibility to colorectal cancer: Secondary | ICD-10-CM

## 2016-12-15 DIAGNOSIS — Z1509 Genetic susceptibility to other malignant neoplasm: Secondary | ICD-10-CM

## 2016-12-15 DIAGNOSIS — Z1501 Genetic susceptibility to malignant neoplasm of breast: Secondary | ICD-10-CM

## 2016-12-15 DIAGNOSIS — Z9189 Other specified personal risk factors, not elsewhere classified: Secondary | ICD-10-CM

## 2016-12-15 MED ORDER — GADOBENATE DIMEGLUMINE 529 MG/ML IV SOLN
11.0000 mL | Freq: Once | INTRAVENOUS | Status: AC | PRN
  Administered 2016-12-15: 11 mL via INTRAVENOUS

## 2017-01-16 ENCOUNTER — Encounter: Payer: Managed Care, Other (non HMO) | Admitting: Physical Medicine & Rehabilitation

## 2017-02-15 ENCOUNTER — Encounter: Payer: Self-pay | Admitting: Neurology

## 2017-02-18 ENCOUNTER — Telehealth: Payer: Self-pay | Admitting: *Deleted

## 2017-02-18 MED ORDER — AMPHETAMINE-DEXTROAMPHETAMINE 10 MG PO TABS: 10.0000 mg | ORAL_TABLET | Freq: Two times a day (BID) | ORAL | 0 refills | Status: DC

## 2017-02-19 NOTE — Telephone Encounter (Signed)
Adderall rx. up front [facility]/fim 

## 2017-03-17 ENCOUNTER — Encounter: Payer: Self-pay | Admitting: Neurology

## 2017-03-17 ENCOUNTER — Other Ambulatory Visit: Payer: Self-pay | Admitting: Neurology

## 2017-03-18 MED ORDER — AMPHETAMINE-DEXTROAMPHETAMINE 10 MG PO TABS: 10.0000 mg | ORAL_TABLET | Freq: Two times a day (BID) | ORAL | 0 refills | Status: DC

## 2017-03-20 ENCOUNTER — Encounter: Payer: Self-pay | Admitting: Gastroenterology

## 2017-03-20 ENCOUNTER — Other Ambulatory Visit: Payer: Self-pay | Admitting: *Deleted

## 2017-03-20 DIAGNOSIS — G35 Multiple sclerosis: Secondary | ICD-10-CM

## 2017-03-20 DIAGNOSIS — K5909 Other constipation: Secondary | ICD-10-CM

## 2017-04-17 ENCOUNTER — Encounter (HOSPITAL_BASED_OUTPATIENT_CLINIC_OR_DEPARTMENT_OTHER): Payer: Self-pay

## 2017-04-17 ENCOUNTER — Other Ambulatory Visit: Payer: Self-pay | Admitting: Urology

## 2017-04-17 ENCOUNTER — Other Ambulatory Visit: Payer: Self-pay

## 2017-04-17 MED ORDER — ONABOTULINUMTOXINA 100 UNITS IJ SOLR: 200.0000 [IU] | Freq: Once | INTRAMUSCULAR | Status: DC

## 2017-04-17 NOTE — Progress Notes (Signed)
Spoke with:  Crystal Park NPO:  After Midnight, no gum, candy, or mints  Arrival time:  [id number] Labs: PCR, Hemoglobin, Urine preg AM medications: Tecfidera Pre op orders: Needs second sign Ride home:  Greig Castilla (husband) [phone]

## 2017-04-19 ENCOUNTER — Encounter: Payer: Self-pay | Admitting: Gastroenterology

## 2017-04-19 ENCOUNTER — Ambulatory Visit (INDEPENDENT_AMBULATORY_CARE_PROVIDER_SITE_OTHER): Payer: Managed Care, Other (non HMO) | Admitting: Gastroenterology

## 2017-04-19 VITALS — BP 102/70 | HR 60 | Ht 66.0 in | Wt 117.0 lb

## 2017-04-19 DIAGNOSIS — K5909 Other constipation: Secondary | ICD-10-CM

## 2017-04-19 DIAGNOSIS — G35 Multiple sclerosis: Secondary | ICD-10-CM | POA: Diagnosis not present

## 2017-04-19 NOTE — Patient Instructions (Signed)
If you are age 55 or older, your body mass index should be between 23-30. Your Body mass index is 18.88 kg/m. If this is out of the aforementioned range listed, please consider follow up with your Primary Care Provider.  If you are age 94 or younger, your body mass index should be between 19-25. Your Body mass index is 18.88 kg/m. If this is out of the aformentioned range listed, please consider follow up with your Primary Care Provider.   Please start one tablespoon of Benefiber daily.  Talk to the neurologist about coming off the Aderral.  Thank you for choosing [facility]  Dr Amada Jupiter III

## 2017-04-19 NOTE — Progress Notes (Signed)
Gastroenterology Consult Note:  History: Crystal Park 04/19/2017  Referring physician: Daisy Floro, MD  Reason for consult/chief complaint: Constipation (pt reports constipation - patient has to strain to have a bm and states that bm are infrequent.  Gets reliief with dulcolax suppositories)   Subjective  HPI:  This is a 57 year old woman referred to see [place] for chronic constipation. She has not previously been seen by this practice.  She has multiple sclerosis and is scheduled next week for a TURBT.  She is with her husband Crystal Park, who provides much of the history because Crystal Park has some difficulty with her memory.  She has had constipation since her MS diagnosis a few years ago.  She apparently underwent a routine screening colonoscopy with the [facility] in 2014 that was normal as best they recall.  We were unable to get those records. Crystal Park will have no real feelings of need for a bowel movement for a couple of days, then will feel some urgency but be unable to pass a BM.  She has been taking MiraLAX once daily for perhaps the last 6-9 months, it seems to make the stools softer but it does not go anywhere easily.  Her husband has to give her a Dulcolax suppository every 2 or 3 days.  There is been no rectal bleeding, abdominal pain, nausea or vomiting.   ROS:  Review of Systems  Constitutional: Positive for fatigue. Negative for appetite change and unexpected weight change.  HENT: Negative for mouth sores and voice change.   Eyes: Negative for pain and redness.  Respiratory: Negative for cough and shortness of breath.   Cardiovascular: Negative for chest pain and palpitations.  Genitourinary: Negative for dysuria and hematuria.       Urinary retention requiring self catheter  Musculoskeletal: Negative for arthralgias and myalgias.  Skin: Negative for pallor and rash.  Neurological: Positive for weakness. Negative for headaches.   Generalized weakness and difficulty with coordination due to MS.  Her mobility is quite limited, she ambulates with the aid of a walker.   Hematological: Negative for adenopathy.     Past Medical History: Past Medical History:  Diagnosis Date  . Benign paroxysmal positional vertigo 10/12/2014  . Constipation   . Family history of breast cancer   . Fatigue   . Headache    history of migaines  . History of kidney stones   . History of MRSA infection   . History of normocytic normochromic anemia   . MS (multiple sclerosis) (HCC)    diagnosed 9/16  . PONV (postoperative nausea and vomiting)      Past Surgical History: Past Surgical History:  Procedure Laterality Date  . BREAST LUMPECTOMY Right    benign  . COLONOSCOPY    . OOPHORECTOMY Left    2013  . TONSILLECTOMY AND ADENOIDECTOMY     age 89     Family History: History reviewed. No pertinent family history. No known family history of colorectal cancer Social History: Social History   Socioeconomic History  . Marital status: Married    Spouse name: None  . Number of children: None  . Years of education: None  . Highest education level: None  Social Needs  . Financial resource strain: None  . Food insecurity - worry: None  . Food insecurity - inability: None  . Transportation needs - medical: None  . Transportation needs - non-medical: None  Occupational History  . None  Tobacco Use  .  Smoking status: Never Smoker  . Smokeless tobacco: Never Used  Substance and Sexual Activity  . Alcohol use: Yes    Alcohol/week: 0.0 oz    Comment: rare  . Drug use: No  . Sexual activity: Not Currently  Other Topics Concern  . None  Social History Narrative  . None    Allergies: No Known Allergies  Outpatient Meds: Current Outpatient Medications  Medication Sig Dispense Refill  . amphetamine-dextroamphetamine (ADDERALL) 10 MG tablet Take 1 tablet (10 mg total) by mouth 2 (two) times daily. 60 tablet 0  .  cholecalciferol (VITAMIN D) 1000 UNITS tablet Take 5,000 Units by mouth daily. otc vit. d 5,000iu daily/fim     . CRANBERRY PO Take 1 capsule by mouth daily.    . diazepam (VALIUM) 5 MG tablet Take up to three times a day po prn dizziness 270 tablet 1  . Misc Natural Products (FIBER SUPREME PO) Take by mouth.    . Multiple Vitamin (MULTIVITAMIN) tablet Take 1 tablet by mouth daily.    Marland Kitchen oxybutynin (DITROPAN) 5 MG tablet 5 mg 2 (two) times daily.     . polyethylene glycol (MIRALAX / GLYCOLAX) packet Take 17 g by mouth daily.    Marland Kitchen sulfamethoxazole-trimethoprim (BACTRIM DS,SEPTRA DS) 800-160 MG tablet Take 1 tablet by mouth daily.    . TECFIDERA 240 MG CPDR TAKE 1 CAPSULE BY MOUTH TWICE DAILY 60 capsule 11   No current facility-administered medications for this visit.    Facility-Administered Medications Ordered in Other Visits  Medication Dose Route Frequency Provider Last Rate Last Dose  . botulinum toxin Type A (BOTOX) injection 200 Units  200 Units Intramuscular Once Jerilee Field, MD      . gadopentetate dimeglumine (MAGNEVIST) injection 11 mL  11 mL Intravenous Once PRN Sater, Richard A, MD      . gadopentetate dimeglumine (MAGNEVIST) injection 12 mL  12 mL Intravenous Once PRN Sater, Pearletha Furl, MD          ___________________________________________________________________ Objective   Exam: Chronically ill-appearing woman with a flattened affect  BP 102/70   Pulse 60   Ht 5\' 6"  (1.676 m)   Wt 117 lb (53.1 kg)   BMI 18.88 kg/m   Generally weak, required much assistance to get on exam table in position for exam.  Eyes: sclera anicteric, no redness  ENT: oral mucosa moist without lesions, no cervical or supraclavicular lymphadenopathy, good dentition  CV: RRR without murmur, S1/S2, no JVD, no peripheral edema  Resp: clear to auscultation bilaterally, normal RR and effort noted  GI: soft, no tenderness, with active bowel sounds. No guarding or palpable organomegaly  noted.  Skin; warm and dry, no rash or jaundice noted  Neuro: awake, alert and oriented x 3. Normal gross motor function and fluent speech Rectal: No external lesions, mildly prolapsed anal tissue.  Decreased resting and voluntary sphincter tone.  There is not good rectal descent felt with Valsalva.  Labs:  Report of CT abdomen and pelvis from February 2018 reveals bilateral hydronephrosis and hydroureter with thickening of the bladder dome   Assessment: Encounter Diagnoses  Name Primary?  . Other constipation Yes  . MS (multiple sclerosis) (HCC)     She has multifactorial constipation related to multiple sclerosis and possibly anticholinergic side effect of Ditropan and/or side effect of Adderall.  I had a long discussion with her and Crystal Park about the nature of this condition as well as the limited available treatments.  We do not want to get  too aggressive with laxatives or stool softening agents, otherwise she will develop incontinence, especially because of impaired anorectal sensation and overall decreased mobility.  Plan:  2-week trial of Benefiber 1 tablespoon daily with plenty of water intake instead of MiraLAX. I would like them to discuss with the radiologist the feasibility of a short trial off of Adderall to see if there is any improvement there. I think she will need to continue the bisacodyl suppository every 2 or 3 days. No further testing planned at this point. See me as needed.  Total time 45 minutes, over half spent in chart review and in discussion with patient and her husband.  Thank you for the courtesy of this consult.  Please call me with any questions or concerns.  Charlie Pitter III  CC: Daisy Floro, MD  Despina Arias, MD (Neurology)

## 2017-04-20 ENCOUNTER — Other Ambulatory Visit: Payer: Self-pay | Admitting: Neurology

## 2017-04-22 MED ORDER — AMPHETAMINE-DEXTROAMPHETAMINE 10 MG PO TABS: 10.0000 mg | ORAL_TABLET | Freq: Two times a day (BID) | ORAL | 0 refills | Status: DC

## 2017-04-22 NOTE — Telephone Encounter (Signed)
Placed printed/signed rx up front for patient pick up.  

## 2017-04-22 NOTE — H&P (Signed)
Office Visit Report     04/08/2017   --------------------------------------------------------------------------------   Crystal Park  MRN: [id number]  PRIMARY CARE:  Crystal Bulls, MD  DOB: November 05, 1960, 57 year old Female  REFERRING:  Crystal Park. Arleta Creek, MD  SSN: [id number]  PROVIDER:  Jerilee Field, M.D.    LOCATION:  [facility] [phone]   --------------------------------------------------------------------------------   CC: I have a neurogenic bladder.  HPI: Crystal Park is a 57 year-old female established patient who is here for a neurogenic bladder.  The event occurred approximately 03/06/2015. Her neurogenic bladder was caused by Multiple Sclerosis. 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   Urgency, UUI developed in 2015. PVR's in 150's. Cystoscopy in 2016 was normal.   Progressive MS in 2017. She was on tolterodine and changed to Myrbetriq, but ins said no. Tried oxybutynin qhs for urgency and UUI. Increased leakage. PVR was 500 ml. Scheduled UDS, but she decompensated and was admitted with hydronephrosis February 2018. She had acute kidney injury which quickly normalized to a creatinine of 0.6 and the Foley placed. She had a MRSA UTI (Sens to NF, clinda). She had a MRI of the C, T, L-spine which showed progressive demyelinating disease. F/u Renal U/S showed no hydro with foley.   UDS Apr 2018 - small capacity (160 mL), unstable bladder with leakage and vesicoureteral reflux with a loss of compliance and DLPP of 33 cm H20. She was never able to generate a urine flow and left a PVR of 130 mL. Exam/cysto were normal May 2018 and she learned CIC.   She's doing well with CIC. Doing it 3-4 times per day. Ultrasound today showed no hydronephrosis. Bladder contained a hypoechoic posterior nodule. Possible clot versus debris versus mass. Minimal LUTS. She's had constipation.   She returns for cystoscopy. She  continues to have urgency and suprapubic pressure in between CIC. She does CIC 3-4 times a day. No incontinence. No dysuria or fever.     ALLERGIES: No Allergies    MEDICATIONS: Oxybutynin Chloride 5 mg tablet 1 tablet PO BID  Adderall 10 mg tablet  Cranberry  Diazepam 1 tablet PO BID  Dulcolax  Lidocaine Hcl 2 % jelly 1 ml Topical Daily PRN  Multi Vitamin/Minerals TABS Oral  Tecfidera 120 mg capsule,delayed release Oral  Vitamin D3 2,000 unit tablet Oral     GU PSH: Catheterize For Residual - 03/18/2017 Complex cystometrogram, w/ void pressure and urethral pressure profile studies, any technique - 06/27/2016 Complex Uroflow - 06/27/2016 Cystoscopy - 07/23/2016 D&C Non-OB - 2015 Emg surf Electrd - 06/27/2016 Inject For cystogram - 06/27/2016 Intrabd voidng Press - 06/27/2016    NON-GU PSH: Breast Surgery Procedure - 2015 Remove Tonsils - 2015    GU PMH: 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 If she has good relief of discomfort can call office for RX. Recommend she alternate ice/heat to vagina. May use either NSAID or Tylenol for pain. Will discuss SP tube placement at f/u. Both her husband and pt have tired CIC w/o success. 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 Urge incontinence - 04/04/2016 Urinary Urgency, Urinary urgency - 2017 Other Disorders Of Bladder, Erythematous bladder mucosa - 2015 Urethral Stricture, Unspec, Urethral stricture - 2015 Urinary Retention, Unspec, Incomplete bladder emptying - 2015    NON-GU PMH: Encounter  for general adult medical examination without abnormal findings, Encounter for preventive health examination - 2015-07-26 Bacteriuria, Bacteriuria, asymptomatic - Jul 25, 2013 Anxiety, Anxiety - 25-Jul-2013    FAMILY HISTORY: Death In The Family Father - Father Diabetes - Runs In Family Glaucoma - Runs In Family   SOCIAL HISTORY:  Marital Status: Married Preferred Language: English; Race: White Current Smoking Status: Patient has never smoked.   Tobacco Use Assessment Completed: Used Tobacco in last 30 days? Does not use smokeless tobacco. Has never drank.  Does not use drugs. Drinks 1 caffeinated drink per day.    REVIEW OF SYSTEMS:    GU Review Female:   Patient reports frequent urination. Patient denies hard to postpone urination, burning /pain with urination, get up at night to urinate, leakage of urine, stream starts and stops, trouble starting your stream, have to strain to urinate, and being pregnant.  Gastrointestinal (Upper):   Patient denies nausea, vomiting, and indigestion/ heartburn.  Gastrointestinal (Lower):   Patient reports constipation and diarrhea.   Constitutional:   Patient reports fatigue. Patient denies fever, night sweats, and weight loss.  Skin:   Patient denies skin rash/ lesion and itching.  Eyes:   Patient denies blurred vision and double vision.  Ears/ Nose/ Throat:   Patient denies sore throat and sinus problems.  Hematologic/Lymphatic:   Patient denies swollen glands and easy bruising.  Cardiovascular:   Patient denies leg swelling and chest pains.  Respiratory:   Patient denies cough and shortness of breath.  Endocrine:   Patient denies excessive thirst.  Musculoskeletal:   Patient denies back pain and joint pain.  Neurological:   Patient reports dizziness. Patient denies headaches.  Psychologic:   Patient denies depression and anxiety.   VITAL SIGNS:      04/08/2017 01:53 PM  BP 111/79 mmHg  Pulse 73 /min  Temperature 96.6 F / 35.8 C   MULTI-SYSTEM PHYSICAL EXAMINATION:    Constitutional: Well-nourished. No physical deformities. Normally developed. Good grooming.  Neck: Neck symmetrical, not swollen. Normal tracheal position.  Respiratory: No labored breathing, no use of accessory muscles.   Cardiovascular: Normal temperature, normal extremity pulses, no swelling, no  varicosities.  Neurologic / Psychiatric: Oriented to time, oriented to place, oriented to person. No depression, no anxiety, no agitation.  Gastrointestinal: No mass, no tenderness, no rigidity, non obese abdomen.     PAST DATA REVIEWED:  Source Of History:  Patient   PROCEDURES:         Flexible Cystoscopy - 52000  Risks, benefits, and some of the potential complications of the procedure were discussed at length with the patient including infection, bleeding, voiding discomfort, urinary retention, fever, chills, sepsis, and others. All questions were answered. Informed consent was obtained. Antibiotic prophylaxis was given. Sterile technique and intraurethral analgesia were used. Chaperone - Kim - for exam and cystoscopy.   Meatus:  Normal size. Normal location. Normal condition.  Urethra:  No hypermobility. No leakage.  Ureteral Orifices:  Normal location. Normal size. Normal shape. Effluxed clear urine.  Bladder:  Mild trabeculation. Solitary tumor - cystic appearing posteriorly. Normal mucosa. No stones.      The lower urinary tract was carefully examined. The procedure was well-tolerated and without complications. Antibiotic instructions were given. Instructions were given to call the office immediately for bloody urine, difficulty urinating, urinary retention, painful or frequent urination, fever, chills, nausea, vomiting or other illness. The patient stated that she understood these instructions and would comply with them.  Catheter / SP Tube - 51701 In and Out Catheterization  A 14 French red rubber or straight catheter was inserted into the bladder using sterile technique. Drained 25cc.         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 We'll discuss with the patient and her husband the nature risks benefits and alternatives to cystoscopy with bladder biopsy/TURBT. All questions answered and they elect to proceed.   Neurogenic bladder with areflexia-she has continued urgency and suprapubic pressure in between CIC. We discussed the nature risks benefits and alternatives to Botox injection and she elected to proceed. Discussed Botox black box warnings.   Will start pt on bactrim ss BID x 5 days and then nightly up to procedure. Urine sent for cx today.   cc: Dr. Tenny Craw     ** Signed by Jerilee Field, M.D. on 04/09/17 at 5:01 PM (EST)**     The information contained in this medical record document is considered private and confidential patient information. This information can only be used for the medical diagnosis and/or medical services that are being provided by the patient's selected caregivers. This information can only be distributed outside of the patient's care if the patient agrees and signs waivers of authorization for this information to be sent to an outside source or  route.  Addendum: urine cx + for pan-sensitive e coli. I started patient on Bactrim.

## 2017-04-23 ENCOUNTER — Ambulatory Visit (HOSPITAL_BASED_OUTPATIENT_CLINIC_OR_DEPARTMENT_OTHER): Payer: Managed Care, Other (non HMO) | Admitting: Anesthesiology

## 2017-04-23 ENCOUNTER — Encounter (HOSPITAL_BASED_OUTPATIENT_CLINIC_OR_DEPARTMENT_OTHER): Payer: Self-pay | Admitting: *Deleted

## 2017-04-23 ENCOUNTER — Encounter (HOSPITAL_BASED_OUTPATIENT_CLINIC_OR_DEPARTMENT_OTHER)
Admission: RE | Disposition: A | Discharge status: Home / Self Care | Payer: Self-pay | Source: Ambulatory Visit | Attending: Urology

## 2017-04-23 ENCOUNTER — Ambulatory Visit (HOSPITAL_BASED_OUTPATIENT_CLINIC_OR_DEPARTMENT_OTHER)
Admission: RE | Admit: 2017-04-23 | Discharge: 2017-04-23 | Disposition: A | Discharge status: Home / Self Care | Payer: Managed Care, Other (non HMO) | Source: Ambulatory Visit | Attending: Urology | Admitting: Urology

## 2017-04-23 ENCOUNTER — Other Ambulatory Visit: Payer: Self-pay

## 2017-04-23 DIAGNOSIS — N3289 Other specified disorders of bladder: Secondary | ICD-10-CM | POA: Diagnosis present

## 2017-04-23 DIAGNOSIS — F419 Anxiety disorder, unspecified: Secondary | ICD-10-CM | POA: Insufficient documentation

## 2017-04-23 DIAGNOSIS — G35 Multiple sclerosis: Secondary | ICD-10-CM | POA: Insufficient documentation

## 2017-04-23 DIAGNOSIS — N319 Neuromuscular dysfunction of bladder, unspecified: Secondary | ICD-10-CM | POA: Diagnosis present

## 2017-04-23 DIAGNOSIS — Z79899 Other long term (current) drug therapy: Secondary | ICD-10-CM | POA: Insufficient documentation

## 2017-04-23 HISTORY — DX: Personal history of urinary calculi: Z87.442

## 2017-04-23 HISTORY — PX: TRANSURETHRAL RESECTION OF BLADDER TUMOR: SHX2575

## 2017-04-23 HISTORY — DX: Constipation, unspecified: K59.00

## 2017-04-23 HISTORY — DX: Other specified postprocedural states: Z98.890

## 2017-04-23 HISTORY — PX: CYSTOSCOPY WITH INJECTION: SHX1424

## 2017-04-23 HISTORY — DX: Other fatigue: R53.83

## 2017-04-23 HISTORY — DX: Family history of malignant neoplasm of breast: Z80.3

## 2017-04-23 HISTORY — DX: Personal history of diseases of the blood and blood-forming organs and certain disorders involving the immune mechanism: Z86.2

## 2017-04-23 HISTORY — DX: Nausea with vomiting, unspecified: R11.2

## 2017-04-23 HISTORY — DX: Headache: R51

## 2017-04-23 HISTORY — DX: Personal history of Methicillin resistant Staphylococcus aureus infection: Z86.14

## 2017-04-23 HISTORY — DX: Headache, unspecified: R51.9

## 2017-04-23 LAB — POCT HEMOGLOBIN-HEMACUE: Hemoglobin: 13.4 g/dL (ref 12.0–15.0)

## 2017-04-23 LAB — POCT PREGNANCY, URINE: Preg Test, Ur: NEGATIVE

## 2017-04-23 SURGERY — TURBT (TRANSURETHRAL RESECTION OF BLADDER TUMOR)
Anesthesia: General

## 2017-04-23 MED ORDER — DEXAMETHASONE SODIUM PHOSPHATE 4 MG/ML IJ SOLN
INTRAMUSCULAR | Status: DC | PRN
  Administered 2017-04-23: 10 mg via INTRAVENOUS

## 2017-04-23 MED ORDER — HYDROMORPHONE HCL 1 MG/ML IJ SOLN
0.2500 mg | INTRAMUSCULAR | Status: DC | PRN
  Administered 2017-04-23: 0.25 mg via INTRAVENOUS
  Filled 2017-04-23: qty 0.5

## 2017-04-23 MED ORDER — HYDROMORPHONE HCL 1 MG/ML IJ SOLN
INTRAMUSCULAR | Status: AC
  Filled 2017-04-23: qty 1

## 2017-04-23 MED ORDER — MIDAZOLAM HCL 5 MG/5ML IJ SOLN
INTRAMUSCULAR | Status: DC | PRN
  Administered 2017-04-23: 1 mg via INTRAVENOUS

## 2017-04-23 MED ORDER — SCOPOLAMINE 1 MG/3DAYS TD PT72
MEDICATED_PATCH | TRANSDERMAL | Status: AC
  Filled 2017-04-23: qty 1

## 2017-04-23 MED ORDER — ONDANSETRON HCL 4 MG/2ML IJ SOLN
INTRAMUSCULAR | Status: DC | PRN
  Administered 2017-04-23: 4 mg via INTRAVENOUS

## 2017-04-23 MED ORDER — PROPOFOL 10 MG/ML IV BOLUS
INTRAVENOUS | Status: DC | PRN
  Administered 2017-04-23: 150 mg via INTRAVENOUS
  Administered 2017-04-23: 10 mg via INTRAVENOUS

## 2017-04-23 MED ORDER — ACETAMINOPHEN 10 MG/ML IV SOLN
1000.0000 mg | Freq: Once | INTRAVENOUS | Status: DC | PRN
  Filled 2017-04-23: qty 100

## 2017-04-23 MED ORDER — PROMETHAZINE HCL 25 MG/ML IJ SOLN
6.2500 mg | INTRAMUSCULAR | Status: DC | PRN
  Filled 2017-04-23: qty 1

## 2017-04-23 MED ORDER — MEPERIDINE HCL 25 MG/ML IJ SOLN
6.2500 mg | INTRAMUSCULAR | Status: DC | PRN
  Filled 2017-04-23: qty 1

## 2017-04-23 MED ORDER — PROPOFOL 10 MG/ML IV BOLUS
INTRAVENOUS | Status: AC
  Filled 2017-04-23: qty 20

## 2017-04-23 MED ORDER — OXYCODONE HCL 5 MG PO TABS
ORAL_TABLET | ORAL | Status: AC
  Filled 2017-04-23: qty 1

## 2017-04-23 MED ORDER — OXYCODONE HCL 5 MG PO TABS
5.0000 mg | ORAL_TABLET | Freq: Once | ORAL | Status: AC
  Administered 2017-04-23: 5 mg via ORAL
  Filled 2017-04-23: qty 1

## 2017-04-23 MED ORDER — CEFAZOLIN SODIUM-DEXTROSE 2-4 GM/100ML-% IV SOLN
INTRAVENOUS | Status: AC
  Filled 2017-04-23: qty 100

## 2017-04-23 MED ORDER — ACETAMINOPHEN 325 MG PO TABS
650.0000 mg | ORAL_TABLET | Freq: Once | ORAL | Status: AC
  Administered 2017-04-23: 650 mg via ORAL
  Filled 2017-04-23: qty 2

## 2017-04-23 MED ORDER — BELLADONNA ALKALOIDS-OPIUM 16.2-60 MG RE SUPP
RECTAL | Status: AC
  Filled 2017-04-23: qty 1

## 2017-04-23 MED ORDER — ACETAMINOPHEN 325 MG PO TABS
ORAL_TABLET | ORAL | Status: AC
  Filled 2017-04-23: qty 2

## 2017-04-23 MED ORDER — PROPOFOL 500 MG/50ML IV EMUL
INTRAVENOUS | Status: DC | PRN
  Administered 2017-04-23: 100 ug/kg/min via INTRAVENOUS

## 2017-04-23 MED ORDER — CEFAZOLIN SODIUM-DEXTROSE 2-4 GM/100ML-% IV SOLN
2.0000 g | Freq: Once | INTRAVENOUS | Status: AC
  Administered 2017-04-23: 2 g via INTRAVENOUS
  Filled 2017-04-23: qty 100

## 2017-04-23 MED ORDER — DEXAMETHASONE SODIUM PHOSPHATE 10 MG/ML IJ SOLN
INTRAMUSCULAR | Status: AC
  Filled 2017-04-23: qty 1

## 2017-04-23 MED ORDER — FENTANYL CITRATE (PF) 100 MCG/2ML IJ SOLN
INTRAMUSCULAR | Status: AC
  Filled 2017-04-23: qty 2

## 2017-04-23 MED ORDER — LACTATED RINGERS IV SOLN
INTRAVENOUS | Status: DC
  Administered 2017-04-23: 10:00:00 via INTRAVENOUS
  Filled 2017-04-23: qty 1000

## 2017-04-23 MED ORDER — ONDANSETRON HCL 4 MG/2ML IJ SOLN
INTRAMUSCULAR | Status: AC
  Filled 2017-04-23: qty 2

## 2017-04-23 MED ORDER — HYDROCODONE-ACETAMINOPHEN 7.5-325 MG PO TABS
1.0000 | ORAL_TABLET | Freq: Once | ORAL | Status: DC | PRN
  Filled 2017-04-23: qty 1

## 2017-04-23 MED ORDER — LIDOCAINE HCL 2 % EX GEL: 1.0000 "application " | CUTANEOUS | 5 refills | Status: DC | PRN

## 2017-04-23 MED ORDER — SODIUM CHLORIDE 0.9 % IJ SOLN
INTRAMUSCULAR | Status: DC | PRN
  Administered 2017-04-23: 20 mL

## 2017-04-23 MED ORDER — FENTANYL CITRATE (PF) 100 MCG/2ML IJ SOLN
INTRAMUSCULAR | Status: DC | PRN
  Administered 2017-04-23: 25 ug via INTRAVENOUS

## 2017-04-23 MED ORDER — LIDOCAINE 2% (20 MG/ML) 5 ML SYRINGE
INTRAMUSCULAR | Status: DC | PRN
  Administered 2017-04-23: 100 mg via INTRAVENOUS

## 2017-04-23 MED ORDER — MIDAZOLAM HCL 2 MG/2ML IJ SOLN
INTRAMUSCULAR | Status: AC
  Filled 2017-04-23: qty 2

## 2017-04-23 MED ORDER — ONABOTULINUMTOXINA 100 UNITS IJ SOLR
INTRAMUSCULAR | Status: DC | PRN
  Administered 2017-04-23: 200 [IU] via INTRAMUSCULAR

## 2017-04-23 MED ORDER — LIDOCAINE 2% (20 MG/ML) 5 ML SYRINGE
INTRAMUSCULAR | Status: AC
  Filled 2017-04-23: qty 5

## 2017-04-23 MED ORDER — SCOPOLAMINE 1 MG/3DAYS TD PT72
1.0000 | MEDICATED_PATCH | TRANSDERMAL | Status: DC
  Administered 2017-04-23: 1.5 mg via TRANSDERMAL
  Filled 2017-04-23: qty 1

## 2017-04-23 SURGICAL SUPPLY — 32 items
BAG DRAIN URO-CYSTO SKYTR STRL (DRAIN) ×2 IMPLANT
BAG DRN ANRFLXCHMBR STRAP LEK (BAG)
BAG DRN UROCATH (DRAIN) ×1
BAG URINE DRAINAGE (UROLOGICAL SUPPLIES) ×1 IMPLANT
BAG URINE LEG 19OZ MD ST LTX (BAG) IMPLANT
CATH FOLEY 2WAY SLVR  5CC 16FR (CATHETERS) ×1
CATH FOLEY 2WAY SLVR  5CC 20FR (CATHETERS)
CATH FOLEY 2WAY SLVR  5CC 22FR (CATHETERS)
CATH FOLEY 2WAY SLVR 5CC 16FR (CATHETERS) IMPLANT
CATH FOLEY 2WAY SLVR 5CC 20FR (CATHETERS) IMPLANT
CATH FOLEY 2WAY SLVR 5CC 22FR (CATHETERS) IMPLANT
CLOTH BEACON ORANGE TIMEOUT ST (SAFETY) ×2 IMPLANT
DRSG TELFA 3X8 NADH (GAUZE/BANDAGES/DRESSINGS) IMPLANT
ELECT REM PT RETURN 9FT ADLT (ELECTROSURGICAL) ×2
ELECTRODE REM PT RTRN 9FT ADLT (ELECTROSURGICAL) ×1 IMPLANT
EVACUATOR MICROVAS BLADDER (UROLOGICAL SUPPLIES) IMPLANT
GLOVE BIO SURGEON STRL SZ7.5 (GLOVE) ×2 IMPLANT
GOWN STRL REUS W/ TWL XL LVL3 (GOWN DISPOSABLE) ×1 IMPLANT
GOWN STRL REUS W/TWL LRG LVL3 (GOWN DISPOSABLE) ×2 IMPLANT
GOWN STRL REUS W/TWL XL LVL3 (GOWN DISPOSABLE) ×2
HOLDER FOLEY CATH W/STRAP (MISCELLANEOUS) ×1 IMPLANT
IV NS IRRIG 3000ML ARTHROMATIC (IV SOLUTION) ×1 IMPLANT
KIT RM TURNOVER CYSTO AR (KITS) ×2 IMPLANT
LOOP CUT BIPOLAR 24F LRG (ELECTROSURGICAL) IMPLANT
MANIFOLD NEPTUNE II (INSTRUMENTS) ×1 IMPLANT
NEEDLE HYPO 22GX1.5 SAFETY (NEEDLE) IMPLANT
NS IRRIG 500ML POUR BTL (IV SOLUTION) IMPLANT
PACK CYSTO (CUSTOM PROCEDURE TRAY) ×2 IMPLANT
PAD DRESSING TELFA 3X8 NADH (GAUZE/BANDAGES/DRESSINGS) IMPLANT
PLUG CATH AND CAP STER (CATHETERS) IMPLANT
TUBE CONNECTING 12X1/4 (SUCTIONS) ×1 IMPLANT
WATER STERILE IRR 3000ML UROMA (IV SOLUTION) ×2 IMPLANT

## 2017-04-23 NOTE — Discharge Instructions (Signed)
Botulinum Toxin Bladder Injection, Care After Refer to this sheet in the next few weeks. These instructions provide you with information about caring for yourself after your procedure. Your health care provider may also give you more specific instructions. Your treatment has been planned according to current medical practices, but problems sometimes occur. Call your health care provider if you have any problems or questions after your procedure. What can I expect after the procedure? After the procedure, it is common to have:  Blood-tinged urine.  Burning or soreness when you pass urine.  Follow these instructions at home:   Do not drive for 24 hours if you received a sedative.  Take over-the-counter and prescription medicines only as told by your health care provider.  If you were prescribed an antibiotic medicine, take it as told by your health care provider. Do not stop taking the antibiotic even if you start to feel better.  Drink enough fluid to keep your urine clear or pale yellow.  Return to your normal activities as told by your health care provider. Ask your health care provider what activities are safe for you.  Keep all follow-up visits as told by your health care provider. This is important. Contact a health care provider if:  You have a fever or chills.  You have blood-tinged urine for more than one day after your procedure.  You have worsening pain or burning when you pass urine.  You have pain or burning when passing urine for more than two days after your procedure.  You have trouble emptying your bladder. Get help right away if:  You have bright red blood in your urine.  You are unable to pass urine. This information is not intended to replace advice given to you by your health care provider. Make sure you discuss any questions you have with your health care provider. Document Released: 11/10/2014 Document Revised: 09/09/2015 Document Reviewed:  08/18/2014 Elsevier Interactive Patient Education  2018 [employer]   Indwelling Urinary Catheter Care, Adult Take good care of your catheter to keep it working and to prevent problems.  Removal of the Foley catheter: Remove the catheter as instructed on Friday morning, Apr 26, 2017 and resume self clean intermittent catheterization  How to wear your catheter Attach your catheter to your leg with tape (adhesive tape) or a leg strap. Make sure it is not too tight. If you use tape, remove any bits of tape that are already on the catheter. How to wear a drainage bag You should have:  A large overnight bag.  A small leg bag.  Overnight Bag You may wear the overnight bag at any time. Always keep the bag below the level of your bladder but off the floor. When you sleep, put a clean plastic bag in a wastebasket. Then hang the bag inside the wastebasket. Leg Bag Never wear the leg bag at night. Always wear the leg bag below your knee. Keep the leg bag secure with a leg strap or tape. How to care for your skin  Clean the skin around the catheter at least once every day.  Shower every day. Do not take baths.  Put creams, lotions, or ointments on your genital area only as told by your doctor.  Do not use powders, sprays, or lotions on your genital area. How to clean your catheter and your skin 1. Wash your hands with soap and water. 2. Wet a washcloth in warm water and gentle (mild) soap. 3. Use the washcloth to  clean the skin where the catheter enters your body. Clean downward and wipe away from the catheter in small circles. Do not wipe toward the catheter. 4. Pat the area dry with a clean towel. Make sure to clean off all soap. How to care for your drainage bags Empty your drainage bag when it is ?- full or at least 2-3 times a day. Replace your drainage bag once a month or sooner if it starts to smell bad or look dirty. Do not clean your drainage bag unless told by your  doctor. Emptying a drainage bag  Supplies Needed  Rubbing alcohol.  Gauze pad or cotton ball.  Tape or a leg strap.  Steps 1. Wash your hands with soap and water. 2. Separate (detach) the bag from your leg. 3. Hold the bag over the toilet or a clean container. Keep the bag below your hips and bladder. This stops pee (urine) from going back into the tube. 4. Open the pour spout at the bottom of the bag. 5. Empty the pee into the toilet or container. Do not let the pour spout touch any surface. 6. Put rubbing alcohol on a gauze pad or cotton ball. 7. Use the gauze pad or cotton ball to clean the pour spout. 8. Close the pour spout. 9. Attach the bag to your leg with tape or a leg strap. 10. Wash your hands.  Changing a drainage bag Supplies Needed  Alcohol wipes.  A clean drainage bag.  Adhesive tape or a leg strap.  Steps 1. Wash your hands with soap and water. 2. Separate the dirty bag from your leg. 3. Pinch the rubber catheter with your fingers so that pee does not spill out. 4. Separate the catheter tube from the drainage tube where these tubes connect (at the connection valve). Do not let the tubes touch any surface. 5. Clean the end of the catheter tube with an alcohol wipe. Use a different alcohol wipe to clean the end of the drainage tube. 6. Connect the catheter tube to the drainage tube of the clean bag. 7. Attach the new bag to the leg with adhesive tape or a leg strap. 8. Wash your hands.  How to prevent infection and other problems  Never pull on your catheter or try to remove it. Pulling can damage tissue in your body.  Always wash your hands before and after touching your catheter.  If a leg strap gets wet, replace it with a dry one.  Drink enough fluids to keep your pee clear or pale yellow, or as told by your doctor.  Do not let the drainage bag or tubing touch the floor.  Wear cotton underwear.  If you are female, wipe from front to back after  you poop (have a bowel movement).  Check on the catheter often to make sure it works and the tubing is not twisted. Get help if:  Your pee is cloudy.  Your pee smells unusually bad.  Your pee is not draining into the bag.  Your tube gets clogged.  Your catheter starts to leak.  Your bladder feels full. Get help right away if:  You have redness, swelling, or pain where the catheter enters your body.  You have fluid, pus, or a bad smell coming from the area where the catheter enters your body.  The area where the catheter enters your body feels warm.  You have a fever.  You have pain in your: ? Stomach (abdomen). ? Legs. ? Lower  back. ? Bladder.  You see blood fill the catheter.  Your pee is pink or red.  You feel sick to your stomach (nauseous).  You throw up (vomit).  You have chills.  Your catheter gets pulled out. This information is not intended to replace advice given to you by your health care provider. Make sure you discuss any questions you have with your health care provider. Document Released: 06/16/2012 Document Revised: 01/18/2016 Document Reviewed: 08/04/2013 Elsevier Interactive Patient Education  2018 [employer].    Post Anesthesia Home Care Instructions  Activity: Get plenty of rest for the remainder of the day. A responsible individual must stay with you for 24 hours following the procedure.  For the next 24 hours, DO NOT: -Drive a car -Advertising copywriter -Drink alcoholic beverages -Take any medication unless instructed by your physician -Make any legal decisions or sign important papers.  Meals: Start with liquid foods such as gelatin or soup. Progress to regular foods as tolerated. Avoid greasy, spicy, heavy foods. If nausea and/or vomiting occur, drink only clear liquids until the nausea and/or vomiting subsides. Call your physician if vomiting continues.  Special Instructions/Symptoms: Your throat may feel dry or sore from the  anesthesia or the breathing tube placed in your throat during surgery. If this causes discomfort, gargle with warm salt water. The discomfort should disappear within 24 hours.  If you had a scopolamine patch placed behind your ear for the management of post- operative nausea and/or vomiting:  1. The medication in the patch is effective for 72 hours, after which it should be removed.  Wrap patch in a tissue and discard in the trash. Wash hands thoroughly with soap and water. 2. You may remove the patch earlier than 72 hours if you experience unpleasant side effects which may include dry mouth, dizziness or visual disturbances. 3. Avoid touching the patch. Wash your hands with soap and water after contact with the patch.

## 2017-04-23 NOTE — Interval H&P Note (Signed)
History and Physical Interval Note:  04/23/2017 10:41 AM  Crystal Park  has presented today for surgery, with the diagnosis of NEUROGENIC BLADDER, BLADDER NEOPLASM  The various methods of treatment have been discussed with the patient and family. After consideration of risks, benefits and other options for treatment, the patient has consented to  Procedure(s): TRANSURETHRAL RESECTION OF BLADDER TUMOR (TURBT) (N/A) CYSTOSCOPY WITH INJECTION/ BOTOX 200 UNITS (N/A) as a surgical intervention .  She has been well.  No dysuria, cloudy urine or fever.  She continues to have urgency.  The patient's history has been reviewed, patient examined, no change in status, stable for surgery.  I have reviewed the patient's chart and labs.  Questions were answered to the patient's satisfaction.     Jerilee Field

## 2017-04-23 NOTE — Anesthesia Postprocedure Evaluation (Signed)
Anesthesia Post Note  Patient: [name]  Procedure(s) Performed: TRANSURETHRAL RESECTION OF BLADDER TUMOR (TURBT) (N/A ) CYSTOSCOPY WITH INJECTION/ BOTOX 200 UNITS (N/A )     Patient location during evaluation: PACU Anesthesia Type: General Level of consciousness: awake and alert Pain management: pain level controlled Vital Signs Assessment: post-procedure vital signs reviewed and stable Respiratory status: spontaneous breathing, nonlabored ventilation, respiratory function stable and patient connected to nasal cannula oxygen Cardiovascular status: blood pressure returned to baseline and stable Postop Assessment: no apparent nausea or vomiting Anesthetic complications: no    Last Vitals:  Vitals:   04/23/17 1250 04/23/17 1345  BP:  109/68  Pulse: 62 63  Resp: 14 10  Temp:  36.5 C  SpO2: 100% 100%    Last Pain:  Vitals:   04/23/17 1345  TempSrc:   PainSc: 1                  Trevor Iha

## 2017-04-23 NOTE — Transfer of Care (Signed)
Last Vitals:  Vitals:   04/23/17 0820 04/23/17 1200  BP: 102/76 (P) 107/67  Pulse: 90   Resp: 14   Temp: 37 C   SpO2: 100%     Last Pain:  Vitals:   04/23/17 0820  TempSrc: Oral      Patients Stated Pain Goal: 5 (04/23/17 6384)  Immediate Anesthesia Transfer of Care Note  Patient: JOYCEANN KIRALY  Procedure(s) Performed: Procedure(s) (LRB): TRANSURETHRAL RESECTION OF BLADDER TUMOR (TURBT) (N/A) CYSTOSCOPY WITH INJECTION/ BOTOX 200 UNITS (N/A)  Patient Location: PACU  Anesthesia Type: General  Level of Consciousness: awake, alert  and oriented  Airway & Oxygen Therapy: Patient Spontanous Breathing and Patient connected to nasal cannula oxygen  Post-op Assessment: Report given to PACU RN and Post -op Vital signs reviewed and stable  Post vital signs: Reviewed and stable  Complications: No apparent anesthesia complications

## 2017-04-23 NOTE — Op Note (Signed)
Preoperative diagnosis: Neurogenic bladder, bladder erythema and cystic bladder mass Postoperative diagnosis: Same  Procedure: Cystoscopy with bladder biopsy and fulguration 3 cm; intravesical injection of botulinum toxin 200 units  Surgeon: Mena Goes  Anesthesia: General  Indication for procedure: 57 year old white female who performs CIC due to neurogenic bladder due to MS who has persistent urgency as well as cystic bladder mass and erythema on cystoscopy.  Findings: On exam under anesthesia of the introitus, meatus and vagina all appeared normal.  On cystoscopy the urethra appeared normal, the trigone and ureteral orifice ease were in the normal orthotopic position with clear reflux.  In the posterior bladder there was a band of friable erythema that bled easily after the bladder was distended.  Superior to this appeared to be a cystic bladder mass that had smooth cyst.  However, after distending the bladder it became apparent these were superficial individual cysts with a normal bladder wall in between.  Description of procedure: After consent was obtained patient brought to the operating room.  After adequate anesthesia she was placed in lithotomy position and prepped and draped in the usual sterile fashion.  A timeout was performed to confirm the patient and procedure.  The cystoscope was passed per urethra and the bladder carefully inspected.  Because these lesions were superficial I elected to use the 9 French flexible cold cup biopsy forceps and the Bugbee electrode.  I biopsied the erythematous friable posterior bladder in 2 locations and sent that as one specimen, #1.  This bandlike region was then fulgurated.  I then distended the bladder again and biopsied 1 of the cystic areas as #2.  The cysts were smooth and appeared benign.  This biopsy site was fulgurated and then I was able to mostly fulgurate each individual cyst and spare some of the bladder wall in between.  Hemostasis was ensured  and then we turned our attention to Botox and passed the Botox injection handle and needle.  200 units were injected at a concentration of 10 units/mL for a total of 20 injections with one final injection of saline to clear the needle.  Because of the injection, biopsies and fulguration I decided to leave a catheter for a few days.  A 16 French Foley was placed after I ensured excellent hemostasis.  This was left to gravity drainage with clear drainage.  She was awakened taken to recovery room in stable condition.  Complications: None  Blood loss: 25 mL  Specimens to pathology: #1 friable, erythematous posterior bladder biopsy #2 cystic posterior bladder biopsy  Drains: 16 French Foley  Disposition: Patient stable to PACU

## 2017-04-23 NOTE — Anesthesia Postprocedure Evaluation (Signed)
Anesthesia Post Note  Patient: VERENISSE OCONNOR  Procedure(s) Performed: TRANSURETHRAL RESECTION OF BLADDER TUMOR (TURBT) (N/A ) CYSTOSCOPY WITH INJECTION/ BOTOX 200 UNITS (N/A )     Patient location during evaluation: PACU Anesthesia Type: General Level of consciousness: awake and alert and oriented Pain management: pain level controlled Vital Signs Assessment: post-procedure vital signs reviewed and stable Respiratory status: spontaneous breathing, nonlabored ventilation and respiratory function stable Cardiovascular status: blood pressure returned to baseline and stable Postop Assessment: no apparent nausea or vomiting Anesthetic complications: no    Last Vitals:  Vitals:   04/23/17 1215 04/23/17 1221  BP: 102/65   Pulse: 69 68  Resp: 15 16  Temp:    SpO2: 100% 100%    Last Pain:  Vitals:   04/23/17 1221  TempSrc:   PainSc: 6                  Aubra Pappalardo A.

## 2017-04-23 NOTE — Anesthesia Preprocedure Evaluation (Addendum)
Anesthesia Evaluation  Patient identified by MRN, date of birth, ID band Patient awake    Reviewed: Allergy & Precautions, NPO status , Patient's Chart, lab work & pertinent test results  History of Anesthesia Complications (+) PONV  Airway Mallampati: II  TM Distance: <3 FB Neck ROM: Full  Mouth opening: Limited Mouth Opening  Dental no notable dental hx. (+) Teeth Intact   Pulmonary neg pulmonary ROS,    Pulmonary exam normal        Cardiovascular hypertension, Normal cardiovascular exam     Neuro/Psych  Headaches, PSYCHIATRIC DISORDERS ADDMultiple Sclerosis-weakness both arms and legs Neurogenic bladder    GI/Hepatic negative GI ROS, Neg liver ROS,   Endo/Other  negative endocrine ROS  Renal/GU ARFRenal diseaseHx/o Bilateral hydronephrosis Bladder dysfunction  Neurogenic bladder    Musculoskeletal negative musculoskeletal ROS (+)   Abdominal   Peds  Hematology negative hematology ROS (+) anemia ,   Anesthesia Other Findings   Reproductive/Obstetrics negative OB ROS                           Anesthesia Physical Anesthesia Plan  ASA: III  Anesthesia Plan: General   Post-op Pain Management:    Induction:   PONV Risk Score and Plan: 2 and Treatment may vary due to age or medical condition, Ondansetron, Dexamethasone and Scopolamine patch - Pre-op  Airway Management Planned: LMA  Additional Equipment:   Intra-op Plan:   Post-operative Plan: Extubation in OR  Informed Consent: I have reviewed the patients History and Physical, chart, labs and discussed the procedure including the risks, benefits and alternatives for the proposed anesthesia with the patient or authorized representative who has indicated his/her understanding and acceptance.   Dental advisory given  Plan Discussed with: CRNA and Surgeon  Anesthesia Plan Comments:        Anesthesia Quick Evaluation

## 2017-04-23 NOTE — Anesthesia Procedure Notes (Signed)
Procedure Name: LMA Insertion Date/Time: 04/23/2017 10:50 AM Performed by: Trevor Iha, MD Pre-anesthesia Checklist: Patient identified, Emergency Drugs available, Suction available and Patient being monitored Patient Re-evaluated:Patient Re-evaluated prior to induction Oxygen Delivery Method: Circle system utilized Preoxygenation: Pre-oxygenation with 100% oxygen Induction Type: IV induction Ventilation: Mask ventilation without difficulty LMA: LMA inserted LMA Size: 4.0 Number of attempts: 1 Airway Equipment and Method: Bite block Placement Confirmation: positive ETCO2 Tube secured with: Tape Dental Injury: Teeth and Oropharynx as per pre-operative assessment

## 2017-04-24 ENCOUNTER — Encounter (HOSPITAL_BASED_OUTPATIENT_CLINIC_OR_DEPARTMENT_OTHER): Payer: Self-pay | Admitting: Urology

## 2017-05-03 ENCOUNTER — Ambulatory Visit (INDEPENDENT_AMBULATORY_CARE_PROVIDER_SITE_OTHER): Payer: Managed Care, Other (non HMO) | Admitting: Neurology

## 2017-05-03 ENCOUNTER — Other Ambulatory Visit: Payer: Self-pay

## 2017-05-03 ENCOUNTER — Encounter: Payer: Self-pay | Admitting: Neurology

## 2017-05-03 VITALS — BP 122/82 | HR 118 | Resp 18 | Ht 66.0 in | Wt 117.0 lb

## 2017-05-03 DIAGNOSIS — N319 Neuromuscular dysfunction of bladder, unspecified: Secondary | ICD-10-CM

## 2017-05-03 DIAGNOSIS — G35 Multiple sclerosis: Secondary | ICD-10-CM | POA: Diagnosis not present

## 2017-05-03 DIAGNOSIS — R3915 Urgency of urination: Secondary | ICD-10-CM | POA: Diagnosis not present

## 2017-05-03 DIAGNOSIS — R5383 Other fatigue: Secondary | ICD-10-CM | POA: Diagnosis not present

## 2017-05-03 DIAGNOSIS — R29898 Other symptoms and signs involving the musculoskeletal system: Secondary | ICD-10-CM

## 2017-05-03 DIAGNOSIS — K5901 Slow transit constipation: Secondary | ICD-10-CM

## 2017-05-03 NOTE — Progress Notes (Signed)
[facility]  PATIENT: Crystal Park DOB: 02-05-1961  REFERRING DOCTOR OR PCP:  Narda Bonds (ENT referred)   [name] (PCP) SOURCE: paitent and records from Dr. Ezzard Standing.    CT images on PACS  _________________________________   HISTORICAL  CHIEF COMPLAINT:  Chief Complaint  Patient presents with  . Multiple Sclerosis    Sts. she continues to tolerate Tecfidera well.  Dizziness improved with Valium tid.  Was unable to tolerate Nuvigil (constipation), so switched back to Adderall.  Has seen gastroenterology, and it was rec. she stop Adderall if able, as this may be contributing to constipation/fim    HISTORY OF PRESENT ILLNESS:  Crystal Park is a 57 year old woman with multiple sclerosis.      Update 05/03/2017: She feels that her MS is mostly stable though her gait is slightly worse her fatigue is significantly worse. She is on Tecfidera 240 mg twice a day and she tolerates it well. She uses a cane to ambulate and has weakness in both legs.   Her arms are just minimally weak.     She notes her fatigue is more severe.    It is physical > cognitive.      Adderall has not helped her fatigue much.    She doe snot note much congnitive issues unless she gets very tired.    The Adderall has not helped the cognitive issues much.     Amantadine may have helped some but caused ankle swelling.    Learta Codding was only tried a short time and while she was having a lot of urinary tract infection so they are uncertain where the not it helped a lot. She could not tolerate Nuvigil (worse constipation)  Her dizziness is doing fairly well on Valium 3 times a day as needed.  Constipation is worse.   Miralax did not help much.  The GI advised dulcolax suppositories every few days.    She had urodynamics.   She had urinary retention and spasming.   She self-caths.    Botox has helped the bladder spasms and associated pain.     From 12/03/2016: MS:   She is on Tecfidera  tolerates it well. She has not had any definite exacerbation.   However, fatigue s much worse.  Brain and spine MRIs did not show any new lesions in Feb 2018.    Gait/strength/sensation:  She uses a cane mostly due to poor balance and right leg weakness/spasticity.   She uses a walker if more tired..   She notes right weakness and clumsiness.  She feels the leg is doing better than earlier this year.   Her right arm is doing better than earlier this year.     Ampyra did not help so she stopped.     Vertigo:   She reports bouts of vertigo, helped by valium (takes twice most days, occ tid)    In the past, she saw ENT and had ENG testing. It showed a central vertigo. Parenteral and other exercises slightly helped. Valium helps more.  Vision:   She denies any difficulty with her vision.   Bladder:   She had a recent UTI and nitrofurantoin was started but she couldn't tolerate it well due to nausea.   She is doing catherizes 6 times a day and that is well tolerated.   She is now seeing urology (sees Dr. Junius Roads at [facility])  She had urosepsis in February 2018.   She is on oxybutynin for bladder spasms.  She is not have any incontinence with this regimen  Fatigue/sleep:   Her fatigue is worse(mental more than physical).   We tried amantadine but it caused edema and she stopped.   Adderall was not well tolerated.    She wakes up once or twice a night because of her bladder..  Mood/Cognitive:   She denies any depression or anxiety. Sometimes she feels a little bit more irritable and afternoons.  She has not noted any major cognitive issues  MS History:   About 2010, she had an event with vertigo and diplopia that took 6 months to improve.  After that she began to have issues with her legs.   I have reviewed the MRI of the brain and the spinal cord.   The MRI of the spine shows several T2 weighted lesions, some peripherally located consistent with multiple sclerosis. None of the foci enhanced. The MRI of the  brain shows multiple foci in the periventricular, juxtacortical cortical and deep white matter consistent with multiple sclerosis.   There is also a left cerebellar focus that could represent remote stroke (it is also present on CT scan from 2010)    REVIEW OF SYSTEMS: Constitutional: No fevers, chills, sweats, or change in appetite.   She notes fatigue Eyes: No visual changes, double vision, eye pain Ear, nose and throat: No hearing loss, ear pain, nasal congestion, sore throat.   Has vertigo Cardiovascular: No chest pain, palpitations Respiratory: No shortness of breath at rest or with exertion.   No wheezes GastrointestinaI: No nausea, vomiting, diarrhea, abdominal pain, fecal incontinence Genitourinary: See above. She has not had any recent UTI.Marland Kitchen Musculoskeletal: No neck pain, back pain Integumentary: No rash, pruritus, skin lesions Neurological: as above Psychiatric: No depression at this time.  Mild anxiety Endocrine: No palpitations, diaphoresis, change in appetite, change in weigh or increased thirst Hematologic/Lymphatic: No anemia, purpura, petechiae. Allergic/Immunologic: No itchy/runny eyes, nasal congestion, recent allergic reactions, rashes  ALLERGIES: No Known Allergies  HOME MEDICATIONS:  Current Outpatient Medications:  .  amphetamine-dextroamphetamine (ADDERALL) 10 MG tablet, Take 1 tablet (10 mg total) by mouth 2 (two) times daily., Disp: 60 tablet, Rfl: 0 .  cholecalciferol (VITAMIN D) 1000 UNITS tablet, Take 5,000 Units by mouth daily. otc vit. d 5,000iu daily/fim , Disp: , Rfl:  .  CRANBERRY PO, Take 1 capsule by mouth daily., Disp: , Rfl:  .  diazepam (VALIUM) 5 MG tablet, Take up to three times a day po prn dizziness, Disp: 270 tablet, Rfl: 1 .  lidocaine (XYLOCAINE) 2 % jelly, Apply 1 application topically as needed. Lidocaine jelly tube, Disp: 30 mL, Rfl: 5 .  Misc Natural Products (FIBER SUPREME PO), Take by mouth., Disp: , Rfl:  .  Multiple Vitamin  (MULTIVITAMIN) tablet, Take 1 tablet by mouth daily., Disp: , Rfl:  .  oxybutynin (DITROPAN) 5 MG tablet, 5 mg 2 (two) times daily. , Disp: , Rfl:  .  polyethylene glycol (MIRALAX / GLYCOLAX) packet, Take 17 g by mouth daily., Disp: , Rfl:  .  sulfamethoxazole-trimethoprim (BACTRIM DS,SEPTRA DS) 800-160 MG tablet, Take 1 tablet by mouth daily., Disp: , Rfl:  .  TECFIDERA 240 MG CPDR, TAKE 1 CAPSULE BY MOUTH TWICE DAILY, Disp: 60 capsule, Rfl: 11 No current facility-administered medications for this visit.   Facility-Administered Medications Ordered in Other Visits:  .  botulinum toxin Type A (BOTOX) injection 200 Units, 200 Units, Intramuscular, Once, Jerilee Field, MD .  gadopentetate dimeglumine (MAGNEVIST) injection 11 mL, 11 mL, Intravenous,  Once PRN, Braedon Sjogren A, MD .  gadopentetate dimeglumine (MAGNEVIST) injection 12 mL, 12 mL, Intravenous, Once PRN, Vlad Mayberry, Pearletha Furl, MD  PAST MEDICAL HISTORY: Past Medical History:  Diagnosis Date  . Benign paroxysmal positional vertigo 10/12/2014  . Constipation   . Family history of breast cancer   . Fatigue   . Headache    history of migaines  . History of kidney stones   . History of MRSA infection   . History of normocytic normochromic anemia   . MS (multiple sclerosis) (HCC)    diagnosed 9/16  . PONV (postoperative nausea and vomiting)     PAST SURGICAL HISTORY: Past Surgical History:  Procedure Laterality Date  . BREAST LUMPECTOMY Right    benign  . COLONOSCOPY    . CYSTOSCOPY WITH INJECTION N/A 04/23/2017   Procedure: CYSTOSCOPY WITH INJECTION/ BOTOX 200 UNITS;  Surgeon: Jerilee Field, MD;  Location: [facility];  Service: Urology;  Laterality: N/A;  . OOPHORECTOMY Left    2013  . TONSILLECTOMY AND ADENOIDECTOMY     age 52  . TRANSURETHRAL RESECTION OF BLADDER TUMOR N/A 04/23/2017   Procedure: TRANSURETHRAL RESECTION OF BLADDER TUMOR (TURBT);  Surgeon: Jerilee Field, MD;  Location: [facility];  Service: Urology;  Laterality: N/A;    FAMILY HISTORY: No family history on file.  SOCIAL HISTORY:  Social History   Socioeconomic History  . Marital status: Married    Spouse name: Not on file  . Number of children: Not on file  . Years of education: Not on file  . Highest education level: Not on file  Social Needs  . Financial resource strain: Not on file  . Food insecurity - worry: Not on file  . Food insecurity - inability: Not on file  . Transportation needs - medical: Not on file  . Transportation needs - non-medical: Not on file  Occupational History  . Not on file  Tobacco Use  . Smoking status: Never Smoker  . Smokeless tobacco: Never Used  Substance and Sexual Activity  . Alcohol use: Yes    Alcohol/week: 0.0 oz    Comment: rare  . Drug use: No  . Sexual activity: Not Currently  Other Topics Concern  . Not on file  Social History Narrative  . Not on file     PHYSICAL EXAM  Vitals:   05/03/17 1005  BP: 122/82  Pulse: (!) 118  Resp: 18  Weight: 117 lb (53.1 kg)  Height: 5\' 6"  (1.676 m)    Body mass index is 18.88 kg/m.   General: The patient is well-developed and well-nourished and in no acute distress.   Hearing is symmetric.   Right > left ankle edema.  Neurologic Exam  Mental status: The patient is alert and oriented x 3 at the time of the examination. The patient has apparent normal recent and remote memory, with an apparently normal attention span and concentration ability.   Speech is normal.  Cranial nerves: Extraocular movements shows right nystagmus to right gaze.Marland Kitchen   Decreased color vision OS.  Facial strength and sensation is normal. Trapezius strength is strong.  No obvious hearing deficits are noted.  Motor:  Muscle bulk is normal.   Tone is mildly increased in right leg. Strength is  5 / 5 in the arms and 4+/5 in left leg and 4-/5 in right leg.   Sensory: She has normal sensation to touch and vibration in the arms  and normal sensation to touch  in the legs. Vibration sensation is mildly reduced in the legs, relative to the arms.  Coordination: Cerebellar testing reveals good finger-nose-finger and reduced heel-to-shin bilaterally, worse on right.  Gait and station: Station is normal.   Gait requires a cane and there is a left foot drop.   Romberg is negative.   Reflexes: Deep tendon reflexes are symmetric and 3+ bilaterally  and increased in the knees (with spread).  No ankle clonus..     25 foot timed walk was 18.1 seconds (average of 2 trials)    DIAGNOSTIC DATA (LABS, IMAGING, TESTING) - I reviewed patient records, labs, notes, testing and imaging myself where available.     ASSESSMENT AND PLAN  MS (multiple sclerosis) (HCC) - Plan: CBC with Differential/Platelet, Comprehensive metabolic panel  Weakness of both legs  Other fatigue  Urinary urgency  Neurogenic bladder  Slow transit constipation   1.   Continue Tecfidera. I will recheck a CBC with differential. Later this year we will need to recheck an MRI of the brain and/or cervical spine.     To help with her poor gait, will add Ampyra. 2.    Bladder issues per urology..    3.   Continue valium for Vertigo.   She will stop the Adderall as it has not helped.  4.   Stay active and exercises tolerated.    5.   I will see her back 5 months or sooner if she has new or worsening symptoms.    Alyha Marines A. Epimenio Foot, MD, PhD 05/03/2017, 11:12 AM Certified in Neurology, Clinical Neurophysiology, Sleep Medicine, Pain Medicine and Neuroimaging  [facility] [address], [address] [place], [place] [address] [phone]

## 2017-05-04 LAB — CBC WITH DIFFERENTIAL/PLATELET
Basophils Absolute: 0 10*3/uL (ref 0.0–0.2)
Basos: 1 %
EOS (ABSOLUTE): 0.1 10*3/uL (ref 0.0–0.4)
Eos: 1 %
Hematocrit: 44.6 % (ref 34.0–46.6)
Hemoglobin: 14.7 g/dL (ref 11.1–15.9)
Immature Grans (Abs): 0 10*3/uL (ref 0.0–0.1)
Immature Granulocytes: 1 %
Lymphocytes Absolute: 1 10*3/uL (ref 0.7–3.1)
Lymphs: 14 %
MCH: 32.2 pg (ref 26.6–33.0)
MCHC: 33 g/dL (ref 31.5–35.7)
MCV: 98 fL — ABNORMAL HIGH (ref 79–97)
Monocytes Absolute: 0.8 10*3/uL (ref 0.1–0.9)
Monocytes: 11 %
Neutrophils Absolute: 5.2 10*3/uL (ref 1.4–7.0)
Neutrophils: 72 %
Platelets: 438 10*3/uL — ABNORMAL HIGH (ref 150–379)
RBC: 4.56 x10E6/uL (ref 3.77–5.28)
RDW: 13.2 % (ref 12.3–15.4)
WBC: 7.1 10*3/uL (ref 3.4–10.8)

## 2017-05-04 LAB — COMPREHENSIVE METABOLIC PANEL
ALT: 68 IU/L — ABNORMAL HIGH (ref 0–32)
AST: 32 IU/L (ref 0–40)
Albumin/Globulin Ratio: 2.3 — ABNORMAL HIGH (ref 1.2–2.2)
Albumin: 5.1 g/dL (ref 3.5–5.5)
Alkaline Phosphatase: 142 IU/L — ABNORMAL HIGH (ref 39–117)
BUN/Creatinine Ratio: 20 (ref 9–23)
BUN: 17 mg/dL (ref 6–24)
Bilirubin Total: 0.4 mg/dL (ref 0.0–1.2)
CO2: 18 mmol/L — ABNORMAL LOW (ref 20–29)
Calcium: 10.3 mg/dL — ABNORMAL HIGH (ref 8.7–10.2)
Chloride: 99 mmol/L (ref 96–106)
Creatinine, Ser: 0.86 mg/dL (ref 0.57–1.00)
GFR calc Af Amer: 89 mL/min/{1.73_m2} (ref >59)
GFR calc non Af Amer: 77 mL/min/{1.73_m2} (ref >59)
Globulin, Total: 2.2 g/dL (ref 1.5–4.5)
Glucose: 63 mg/dL — ABNORMAL LOW (ref 65–99)
Potassium: 4.5 mmol/L (ref 3.5–5.2)
Sodium: 140 mmol/L (ref 134–144)
Total Protein: 7.3 g/dL (ref 6.0–8.5)

## 2017-05-06 ENCOUNTER — Telehealth: Payer: Self-pay | Admitting: *Deleted

## 2017-05-06 NOTE — Telephone Encounter (Signed)
-----   Message from Asa Lente, MD sent at 05/06/2017  9:33 AM EST ----- Labs are ok. One of the liver tests was mildly high but not enough to worry about.

## 2017-05-06 NOTE — Telephone Encounter (Signed)
LMOM (Identified vm) with below lab results.  She does not need to return this call unless she has questions/fim

## 2017-05-29 ENCOUNTER — Encounter: Payer: Self-pay | Admitting: *Deleted

## 2017-06-27 ENCOUNTER — Telehealth: Payer: Self-pay | Admitting: *Deleted

## 2017-06-27 NOTE — Telephone Encounter (Signed)
Ampyra PA approved by Rosann Auerbach [phone]).  Pt UJ#[id number].  AOZ#[id number].  Valid through 06/26/2018.  Ampyra Patient Support Services notified of approval by fax (ph: [phone], fax: [phone]).

## 2017-07-01 ENCOUNTER — Telehealth: Payer: Self-pay | Admitting: *Deleted

## 2017-07-01 MED ORDER — DALFAMPRIDINE ER 10 MG PO TB12: 10.0000 mg | ORAL_TABLET | Freq: Two times a day (BID) | ORAL | 11 refills | Status: DC

## 2017-07-01 NOTE — Telephone Encounter (Signed)
Ampyra escribed to [employer] in response to faxed request from them/fim

## 2017-07-26 ENCOUNTER — Encounter: Payer: Self-pay | Admitting: Neurology

## 2017-07-26 MED ORDER — DIAZEPAM 5 MG PO TABS: ORAL_TABLET | ORAL | 1 refills | Status: DC

## 2017-08-26 ENCOUNTER — Telehealth: Payer: Self-pay | Admitting: *Deleted

## 2017-08-26 NOTE — Telephone Encounter (Signed)
PA for Tecfidera 240mg  #180/90 completed and faxed to [facility], fax# [phone].  Dx: RRMS (G35).  She has been on Tecfidera since dx. in Oct. 2016.  MS has been clinically stable on Tecfidera.  Changing medications at this time would put her at risk for relapse/permanent disability/fim

## 2017-08-26 NOTE — Telephone Encounter (Signed)
Fax received from [facility].  Tecfidera 240mg  approved for dates 08/26/17 thru 08/27/18. Request ID# [id number]/fim

## 2017-09-19 ENCOUNTER — Other Ambulatory Visit: Payer: Self-pay | Admitting: Neurology

## 2017-10-03 ENCOUNTER — Encounter: Payer: Self-pay | Admitting: Neurology

## 2017-10-03 ENCOUNTER — Ambulatory Visit (INDEPENDENT_AMBULATORY_CARE_PROVIDER_SITE_OTHER): Payer: Managed Care, Other (non HMO) | Admitting: Neurology

## 2017-10-03 VITALS — BP 126/78 | HR 75 | Ht 66.0 in | Wt 127.5 lb

## 2017-10-03 DIAGNOSIS — G35 Multiple sclerosis: Secondary | ICD-10-CM

## 2017-10-03 DIAGNOSIS — N319 Neuromuscular dysfunction of bladder, unspecified: Secondary | ICD-10-CM | POA: Diagnosis not present

## 2017-10-03 DIAGNOSIS — R269 Unspecified abnormalities of gait and mobility: Secondary | ICD-10-CM | POA: Diagnosis not present

## 2017-10-03 DIAGNOSIS — R5383 Other fatigue: Secondary | ICD-10-CM

## 2017-10-03 NOTE — Progress Notes (Signed)
[facility]  PATIENT: Crystal Park DOB: [birth year]  REFERRING DOCTOR OR PCP:  Narda Bonds (ENT referred)   Crystal Park (PCP) SOURCE: paitent and records from Dr. Ezzard Park.    CT images on PACS  _________________________________   HISTORICAL  CHIEF COMPLAINT:  Chief Complaint  Patient presents with  . Follow-up  . Multiple Sclerosis    Noted some improvement with gait on amypra.  Adderall caused constipation, noted no change when off.      HISTORY OF PRESENT ILLNESS:  Crystal Park is a 57 y.o. woman with multiple sclerosis.      Update 10/03/2017: She feels her MS is stable and has no recent exacerbations.   She is on Tecfidera and she tolerates it well.    Her last lymphocyte count was 1.0.       Gait is off and she uses a cane.    She started Ampyra and feels gait is improved some.    She did have a fall.  She looked to the left (turned quickly) and lost balance.    She fell, hitting the washer/dryer.    She hit her head but there was no LOC.   She did not have HA afterwards but her vision felt blurry for under a minute.   She  Notes that she gets dizzy looking left, even when sitting nand she feels off balanced when she does while Park.      She feels strength is better on the right side.    Fatigue is a daily problem.  It is physical > cognitive.    She stopped Adderall due to constipation.    It had not really helped her any.   She feels her mood is variable.   She sometimes gets weepy and sometimes is irritable.  Valiu has helped the anxiety and she takes tid.   It also helps dizziness        Update 05/03/2017: She feels that her MS is mostly stable though her gait is slightly worse her fatigue is significantly worse. She is on Tecfidera 240 mg twice a day and she tolerates it well. She uses a cane to ambulate and has weakness in both legs.   Her arms are just minimally weak.     She notes her fatigue is more severe.    It is physical >  cognitive.      Adderall has not helped her fatigue much.    She doe snot note much congnitive issues unless she gets very tired.    The Adderall has not helped the cognitive issues much.     Amantadine may have helped some but caused ankle swelling.    Learta Codding was only tried a short time and while she was having a lot of urinary tract infection so they are uncertain where the not it helped a lot. She could not tolerate Nuvigil (worse constipation)  Her dizziness is doing fairly well on Valium 3 times a day as needed.  Constipation is worse.   Miralax did not help much.  The GI advised dulcolax suppositories every few days.    She had urodynamics.   She had urinary retention and spasming.   She self-caths.    Botox has helped the bladder spasms and associated pain.     From 12/03/2016: MS:   She is on Tecfidera tolerates it well. She has not had any definite exacerbation.   However, fatigue s much worse.  Brain and spine MRIs  did not show any new lesions in Feb 2018.    Gait/strength/sensation:  She uses a cane mostly due to poor balance and right leg weakness/spasticity.   She uses a walker if more tired..   She notes right weakness and clumsiness.  She feels the leg is doing better than earlier this year.   Her right arm is doing better than earlier this year.     Ampyra did not help so she stopped.     Vertigo:   She reports bouts of vertigo, helped by valium (takes twice most days, occ tid)    In the past, she saw ENT and had ENG testing. It showed a central vertigo. Parenteral and other exercises slightly helped. Valium helps more.  Vision:   She denies any difficulty with her vision.   Bladder:   She had a recent UTI and nitrofurantoin was started but she couldn't tolerate it well due to nausea.   She is doing catherizes 6 times a day and that is well tolerated.   She is now seeing urology (sees Dr. Junius Roads at [facility])  She had urosepsis in February 2018.   She is on oxybutynin for bladder  spasms.   She is not have any incontinence with this regimen  Fatigue/sleep:   Her fatigue is worse(mental more than physical).   We tried amantadine but it caused edema and she stopped.   Adderall was not well tolerated.    She wakes up once or twice a night because of her bladder..  Mood/Cognitive:   She denies any depression or anxiety. Sometimes she feels a little bit more irritable and afternoons.  She has not noted any major cognitive issues  MS History:   About 2010, she had an event with vertigo and diplopia that took 6 months to improve.  After that she began to have issues with her legs.   I have reviewed the MRI of the brain and the spinal cord.   The MRI of the spine shows several T2 weighted lesions, some peripherally located consistent with multiple sclerosis. None of the foci enhanced. The MRI of the brain shows multiple foci in the periventricular, juxtacortical cortical and deep white matter consistent with multiple sclerosis.   There is also a left cerebellar focus that could represent remote stroke (it is also present on CT scan from 2010)    REVIEW OF SYSTEMS: Constitutional: No fevers, chills, sweats, or change in appetite.   She notes fatigue Eyes: No visual changes, double vision, eye pain Ear, nose and throat: No hearing loss, ear pain, nasal congestion, sore throat.   Has vertigo Cardiovascular: No chest pain, palpitations Respiratory: No shortness of breath at rest or with exertion.   No wheezes GastrointestinaI: No nausea, vomiting, diarrhea, abdominal pain, fecal incontinence Genitourinary: See above. She has not had any recent UTI.Marland Kitchen Musculoskeletal: No neck pain, back pain Integumentary: No rash, pruritus, skin lesions Neurological: as above Psychiatric: No depression at this time.  Mild anxiety Endocrine: No palpitations, diaphoresis, change in appetite, change in weigh or increased thirst Hematologic/Lymphatic: No anemia, purpura,  petechiae. Allergic/Immunologic: No itchy/runny eyes, nasal congestion, recent allergic reactions, rashes  ALLERGIES: Allergies  Allergen Reactions  . Adderall [Amphetamine-Dextroamphetamine] Other (See Comments)    constipation    HOME MEDICATIONS:  Current Outpatient Medications:  .  cholecalciferol (VITAMIN D) 1000 UNITS tablet, Take 5,000 Units by mouth daily. otc vit. d 5,000iu daily/fim , Disp: , Rfl:  .  CRANBERRY PO, Take 1 capsule by mouth  daily., Disp: , Rfl:  .  dalfampridine (AMPYRA) 10 MG TB12, Take 1 tablet (10 mg total) by mouth 2 (two) times daily., Disp: 60 tablet, Rfl: 11 .  diazepam (VALIUM) 5 MG tablet, Take up to three times a day po prn dizziness, Disp: 270 tablet, Rfl: 1 .  Multiple Vitamin (MULTIVITAMIN) tablet, Take 1 tablet by mouth daily., Disp: , Rfl:  .  oxybutynin (DITROPAN) 5 MG tablet, 5 mg daily. , Disp: , Rfl:  .  TECFIDERA 240 MG CPDR, TAKE 1 CAPSULE BY MOUTH TWICE DAILY, Disp: 60 capsule, Rfl: 11 No current facility-administered medications for this visit.   Facility-Administered Medications Ordered in Other Visits:  .  botulinum toxin Type A (BOTOX) injection 200 Units, 200 Units, Intramuscular, Once, Jerilee Field, MD .  gadopentetate dimeglumine (MAGNEVIST) injection 11 mL, 11 mL, Intravenous, Once PRN, Mayeli Bornhorst A, MD .  gadopentetate dimeglumine (MAGNEVIST) injection 12 mL, 12 mL, Intravenous, Once PRN, Mckynleigh Mussell, Pearletha Furl, MD  PAST MEDICAL HISTORY: Past Medical History:  Diagnosis Date  . Benign paroxysmal positional vertigo 10/12/2014  . Constipation   . Family history of breast cancer   . Fatigue   . Headache    history of migaines  . History of kidney stones   . History of MRSA infection   . History of normocytic normochromic anemia   . MS (multiple sclerosis) (HCC)    diagnosed 9/16  . PONV (postoperative nausea and vomiting)     PAST SURGICAL HISTORY: Past Surgical History:  Procedure Laterality Date  . BREAST  LUMPECTOMY Right    benign  . COLONOSCOPY    . CYSTOSCOPY WITH INJECTION N/A 04/23/2017   Procedure: CYSTOSCOPY WITH INJECTION/ BOTOX 200 UNITS;  Surgeon: Jerilee Field, MD;  Location: [facility];  Service: Urology;  Laterality: N/A;  . OOPHORECTOMY Left    2013  . TONSILLECTOMY AND ADENOIDECTOMY     age 57  . TRANSURETHRAL RESECTION OF BLADDER TUMOR N/A 04/23/2017   Procedure: TRANSURETHRAL RESECTION OF BLADDER TUMOR (TURBT);  Surgeon: Jerilee Field, MD;  Location: [facility];  Service: Urology;  Laterality: N/A;    FAMILY HISTORY: History reviewed. No pertinent family history.  SOCIAL HISTORY:  Social History   Socioeconomic History  . Marital status: Married    Spouse name: Not on file  . Number of children: Not on file  . Years of education: Not on file  . Highest education level: Not on file  Occupational History  . Not on file  Social Needs  . Financial resource strain: Not on file  . Food insecurity:    Worry: Not on file    Inability: Not on file  . Transportation needs:    Medical: Not on file    Non-medical: Not on file  Tobacco Use  . Smoking status: Never Smoker  . Smokeless tobacco: Never Used  Substance and Sexual Activity  . Alcohol use: Yes    Alcohol/week: 0.0 oz    Comment: rare  . Drug use: No  . Sexual activity: Not Currently  Lifestyle  . Physical activity:    Days per week: Not on file    Minutes per session: Not on file  . Stress: Not on file  Relationships  . Social connections:    Talks on phone: Not on file    Gets together: Not on file    Attends religious service: Not on file    Active member of club or organization: Not on file  Attends meetings of clubs or organizations: Not on file    Relationship status: Not on file  . Intimate partner violence:    Fear of current or ex partner: Not on file    Emotionally abused: Not on file    Physically abused: Not on file    Forced sexual activity:  Not on file  Other Topics Concern  . Not on file  Social History Narrative  . Not on file     PHYSICAL EXAM  Vitals:   10/03/17 1126  BP: 126/78  Pulse: 75  Weight: 127 lb 8 oz (57.8 kg)  Height: 5\' 6"  (1.676 m)    Body mass index is 20.58 kg/m.   General: The patient is well-developed and well-nourished and in no acute distress.   Hearing is symmetric.   Right > left ankle edema.  Neurologic Exam  Mental status: The patient is alert and oriented x 3 at the time of the examination. The patient has apparent normal recent and remote memory, with an apparently normal attention span and concentration ability.   Speech is normal.  Cranial nerves: Extraocular movements shows right nystagmus to right gaze with mildly reduced ability to abduct the right eye and milder nystagmus when she looks to the left..   She has reduced color vision on the left facial strength and sensation is normal. Trapezius strength is strong.  No obvious hearing deficits are noted.  Motor:  Muscle bulk is normal.   Muscle tone is increased in the legs, right greater than left. Strength is  5 / 5 in the arms and 4+/5 in left leg and 4-/5 in right leg.   Sensory: She has normal sensation to touch and vibration in the arms and normal sensation to touch in the legs.  Vibration sensation was symmetric in the legs. Coordination: Cerebellar testing shows fairly good finger-nose-finger and mildly reduced heel-to-shin.  Gait and station: Station is normal.   She is able to walk within the room not using a cane but walks faster with a cane..   Romberg is negative.   Reflexes: Deep tendon reflexes are symmetric and 3+ bilaterally  and increased in the knees (with spread).  No ankle clonus..     25 foot timed walk was 18.1 seconds (average of 2 trials)    DIAGNOSTIC DATA (LABS, IMAGING, TESTING) - I reviewed patient records, labs, notes, testing and imaging myself where available.     ASSESSMENT AND PLAN  MS  (multiple sclerosis) (HCC)  Multiple sclerosis (HCC) - Plan: MR BRAIN W WO CONTRAST, CBC with Differential/Platelet  Gait disturbance  Other fatigue  Neurogenic bladder   1.   She will continue Tecfidera.  We will check a CBC with differential today.  We will also check a follow-up MRI of the brain to determine if there is any subclinical activity.  If present, consider a switch to a more efficacious medication.   2.    Continue Ampyra for gait. 3.   Continue valium for Vertigo.     4.   Stay active and exercises tolerated.    5.   I will see her back 5 months or sooner if she has new or worsening symptoms.    Marylu Dudenhoeffer A. Epimenio Foot, MD, PhD 10/03/2017, 1:31 PM Certified in Neurology, Clinical Neurophysiology, Sleep Medicine, Pain Medicine and Neuroimaging  [facility] [address], [address] [place], [place] [address] [phone]

## 2017-10-04 ENCOUNTER — Telehealth: Payer: Self-pay | Admitting: *Deleted

## 2017-10-04 LAB — CBC WITH DIFFERENTIAL/PLATELET
Basophils Absolute: 0.1 10*3/uL (ref 0.0–0.2)
Basos: 1 %
EOS (ABSOLUTE): 0.1 10*3/uL (ref 0.0–0.4)
Eos: 2 %
Hematocrit: 40.7 % (ref 34.0–46.6)
Hemoglobin: 13.1 g/dL (ref 11.1–15.9)
Immature Grans (Abs): 0 10*3/uL (ref 0.0–0.1)
Immature Granulocytes: 0 %
Lymphocytes Absolute: 1.1 10*3/uL (ref 0.7–3.1)
Lymphs: 16 %
MCH: 31.2 pg (ref 26.6–33.0)
MCHC: 32.2 g/dL (ref 31.5–35.7)
MCV: 97 fL (ref 79–97)
Monocytes Absolute: 0.8 10*3/uL (ref 0.1–0.9)
Monocytes: 13 %
Neutrophils Absolute: 4.4 10*3/uL (ref 1.4–7.0)
Neutrophils: 68 %
Platelets: 339 10*3/uL (ref 150–450)
RBC: 4.2 x10E6/uL (ref 3.77–5.28)
RDW: 14 % (ref 12.3–15.4)
WBC: 6.5 10*3/uL (ref 3.4–10.8)

## 2017-10-04 NOTE — Telephone Encounter (Signed)
Spoke to patient she is aware of the results ?

## 2017-10-04 NOTE — Telephone Encounter (Signed)
-----   Message from Asa Lente, MD sent at 10/04/2017  8:18 AM EDT ----- Please let the patient know that the lab work is fine.

## 2017-11-25 DIAGNOSIS — Z0289 Encounter for other administrative examinations: Secondary | ICD-10-CM

## 2017-11-26 ENCOUNTER — Telehealth: Payer: Self-pay | Admitting: *Deleted

## 2017-11-26 NOTE — Telephone Encounter (Signed)
Disability Forms completed and returned to med. records/fim

## 2017-11-26 NOTE — Telephone Encounter (Signed)
Pt [place] form on Faith desk. 

## 2017-11-27 NOTE — Telephone Encounter (Signed)
Disability forms faxed to [facility] @ [phone].

## 2017-12-26 NOTE — Telephone Encounter (Signed)
Crystal Park- can you look into this? Thank you!

## 2017-12-26 NOTE — Telephone Encounter (Signed)
Noted, thank you

## 2017-12-26 NOTE — Telephone Encounter (Signed)
Patients husband called and stated that when the form was received only the front side was filled out and it was missing a second page or the back side of the page. Please call and advise.

## 2017-12-26 NOTE — Telephone Encounter (Signed)
I called the pt to inform him that ,  I re faxed the front and the back side of the  [facility] form.  [phone]

## 2018-01-27 ENCOUNTER — Telehealth: Payer: Self-pay | Admitting: Neurology

## 2018-01-27 NOTE — Telephone Encounter (Signed)
Pt husband(on [employer],Andrew J) has called stating he was told a MRI would be ordered for pt but they have not been contacted, please call

## 2018-01-27 NOTE — Telephone Encounter (Signed)
Spoke with the husband and gave him the number to GI. DW

## 2018-02-07 ENCOUNTER — Other Ambulatory Visit: Payer: Self-pay | Admitting: *Deleted

## 2018-02-07 MED ORDER — DIAZEPAM 5 MG PO TABS: ORAL_TABLET | ORAL | 1 refills | Status: DC

## 2018-02-14 ENCOUNTER — Ambulatory Visit
Admission: RE | Admit: 2018-02-14 | Discharge: 2018-02-14 | Disposition: A | Discharge status: Home / Self Care | Payer: Managed Care, Other (non HMO) | Source: Ambulatory Visit | Attending: Neurology | Admitting: Neurology

## 2018-02-14 DIAGNOSIS — G35 Multiple sclerosis: Secondary | ICD-10-CM

## 2018-02-14 MED ORDER — GADOBENATE DIMEGLUMINE 529 MG/ML IV SOLN
10.0000 mL | Freq: Once | INTRAVENOUS | Status: AC | PRN
  Administered 2018-02-14: 10 mL via INTRAVENOUS

## 2018-02-17 ENCOUNTER — Encounter: Payer: Self-pay | Admitting: *Deleted

## 2018-03-26 ENCOUNTER — Other Ambulatory Visit: Payer: Self-pay | Admitting: Obstetrics and Gynecology

## 2018-06-01 ENCOUNTER — Other Ambulatory Visit: Payer: Self-pay | Admitting: Neurology

## 2018-06-05 ENCOUNTER — Ambulatory Visit (INDEPENDENT_AMBULATORY_CARE_PROVIDER_SITE_OTHER): Payer: Managed Care, Other (non HMO) | Admitting: Neurology

## 2018-06-05 ENCOUNTER — Other Ambulatory Visit: Payer: Self-pay

## 2018-06-05 ENCOUNTER — Telehealth: Payer: Self-pay | Admitting: Neurology

## 2018-06-05 ENCOUNTER — Encounter: Payer: Self-pay | Admitting: Neurology

## 2018-06-05 DIAGNOSIS — G35 Multiple sclerosis: Secondary | ICD-10-CM | POA: Diagnosis not present

## 2018-06-05 DIAGNOSIS — R29898 Other symptoms and signs involving the musculoskeletal system: Secondary | ICD-10-CM

## 2018-06-05 DIAGNOSIS — N319 Neuromuscular dysfunction of bladder, unspecified: Secondary | ICD-10-CM | POA: Diagnosis not present

## 2018-06-05 DIAGNOSIS — R269 Unspecified abnormalities of gait and mobility: Secondary | ICD-10-CM | POA: Diagnosis not present

## 2018-06-05 DIAGNOSIS — R5383 Other fatigue: Secondary | ICD-10-CM

## 2018-06-05 MED ORDER — ARMODAFINIL 200 MG PO TABS: ORAL_TABLET | ORAL | 5 refills | Status: DC

## 2018-06-05 NOTE — Telephone Encounter (Signed)
06/05/18 LVM to schedule 6 mo f/u per Dr. Epimenio Foot

## 2018-06-05 NOTE — Progress Notes (Signed)
[facility]  PATIENT: Crystal Park DOB: 1960/12/25  REFERRING DOCTOR OR PCP:  Narda Bonds (ENT referred)   [name] (PCP) SOURCE: paitent and records from Dr. Ezzard Standing.    CT images on PACS  _________________________________   HISTORICAL  CHIEF COMPLAINT:  Chief Complaint  Patient presents with   Multiple Sclerosis    On Tecfidera    HISTORY OF PRESENT ILLNESS:  Crystal Park is a 58 y.o. woman with multiple sclerosis.      Update 06/05/2018: Virtual Visit via Video Note I connected with Crystal Park on 06/05/18 at  2:30 PM EDT by a video enabled telemedicine application and verified that I am speaking with the correct person.   Her husband was also present.   I discussed the limitations of evaluation and management by telemedicine and the availability of in person appointments. The patient expressed understanding and agreed to proceed.  History of Present Illness: She is on Tecfidera and tolerates it ok but feesl it makes her more tired.  She denies any exacerbation but is feeling mor tired and generally weak.   This seems worse the last few weeks.   She denies fevers.   She is sleeping poorly getting 6 hours most nights but then taking 2 long naps a day (probalby getting 10 hours sleep/day). .  She notes nocturia keeps her up.   She takes oxybutynin with some bnefiit.   She is usually in bed many hours a day not sleeping.   She also takes a nap most afternoons  Her gait is doing a little worse.  She uses a walker now more than sue was doing and rarely uses the cane now.   She has one fall where her knees just gave out while using the walker.  She is on Ampyra.   It seemed to help he more initially.  Both legs are weak but right leg is more spastic.  She takes diazepam twice a day.       She feels dizzy when she moves her head.  She takes valium with some benefit.    She denies depression.  She is staying inside more because of Covid-19.   She  keeps mentally active.   She does word game puzzles.     She was started on thyroid medication levothyroxine 88 mcg.  Observations/Objective: She is a well-developed well-nourished woman in no acute distress.  The head is normocephalic and atraumatic.  Visible skin is normal.  She is alert and fully oriented with fluent speech and good attention, focus and memory.  Speech was normal.  Extraocular muscles are intact.  Facial strength and trapezius strength are normal.  She appears to have muscle bulk and normal strength and coordination in the arms.  Sensation and reflexes could not be tested.  Assessment and Plan: Multiple sclerosis exacerbation (HCC)  Weakness of both legs  Neurogenic bladder  Gait disturbance  Other fatigue  1.   She will continue Tecfidera.  We will check a CBC with differential of the next visit.  Her last few lymphocyte counts have been fine 2.   She will retry Nuvigil for her fatigue.  Difficulties tolerating Adderall and it did not help. 3.   Continue dalfampridine for gait issues.  Try to stay active and exercise as tolerated. 4.   He will return to see me in 6 months but call sooner if new or worsening neurologic symptoms.  Follow Up Instructions: I discussed the assessment and  treatment plan with the patient. The patient was provided an opportunity to ask questions and all were answered. The patient agreed with the plan and demonstrated an understanding of the instructions.   The patient was advised to call back or seek an in-person evaluation if the symptoms worsen or if the condition fails to improve as anticipated.  I provided 25 minutes of non-face-to-face time during this encounter.   Asa Lente, MD   Update 10/03/2017: She feels her MS is stable and has no recent exacerbations.   She is on Tecfidera and she tolerates it well.    Her last lymphocyte count was 1.0.       Gait is off and she uses a cane.    She started Ampyra and feels gait is  improved some.    She did have a fall.  She looked to the left (turned quickly) and lost balance.    She fell, hitting the washer/dryer.    She hit her head but there was no LOC.   She did not have HA afterwards but her vision felt blurry for under a minute.   She  Notes that she gets dizzy looking left, even when sitting nand she feels off balanced when she does while standing.      She feels strength is better on the right side.    Fatigue is a daily problem.  It is physical > cognitive.    She stopped Adderall due to constipation.    It had not really helped her any.   She feels her mood is variable.   She sometimes gets weepy and sometimes is irritable.  Valiu has helped the anxiety and she takes tid.   It also helps dizziness        Update 05/03/2017: She feels that her MS is mostly stable though her gait is slightly worse her fatigue is significantly worse. She is on Tecfidera 240 mg twice a day and she tolerates it well. She uses a cane to ambulate and has weakness in both legs.   Her arms are just minimally weak.     She notes her fatigue is more severe.    It is physical > cognitive.      Adderall has not helped her fatigue much.    She doe snot note much congnitive issues unless she gets very tired.    The Adderall has not helped the cognitive issues much.     Amantadine may have helped some but caused ankle swelling.    Learta Codding was only tried a short time and while she was having a lot of urinary tract infection so they are uncertain where the not it helped a lot. She could not tolerate Nuvigil (worse constipation)  Her dizziness is doing fairly well on Valium 3 times a day as needed.  Constipation is worse.   Miralax did not help much.  The GI advised dulcolax suppositories every few days.    She had urodynamics.   She had urinary retention and spasming.   She self-caths.    Botox has helped the bladder spasms and associated pain.     From 12/03/2016: MS:   She is on Tecfidera tolerates it  well. She has not had any definite exacerbation.   However, fatigue s much worse.  Brain and spine MRIs did not show any new lesions in Feb 2018.    Gait/strength/sensation:  She uses a cane mostly due to poor balance and right leg weakness/spasticity.  She uses a walker if more tired..   She notes right weakness and clumsiness.  She feels the leg is doing better than earlier this year.   Her right arm is doing better than earlier this year.     Ampyra did not help so she stopped.     Vertigo:   She reports bouts of vertigo, helped by valium (takes twice most days, occ tid)    In the past, she saw ENT and had ENG testing. It showed a central vertigo. Parenteral and other exercises slightly helped. Valium helps more.  Vision:   She denies any difficulty with her vision.   Bladder:   She had a recent UTI and nitrofurantoin was started but she couldn't tolerate it well due to nausea.   She is doing catherizes 6 times a day and that is well tolerated.   She is now seeing urology (sees Dr. Junius Roads at [facility])  She had urosepsis in February 2018.   She is on oxybutynin for bladder spasms.   She is not have any incontinence with this regimen  Fatigue/sleep:   Her fatigue is worse(mental more than physical).   We tried amantadine but it caused edema and she stopped.   Adderall was not well tolerated.    She wakes up once or twice a night because of her bladder..  Mood/Cognitive:   She denies any depression or anxiety. Sometimes she feels a little bit more irritable and afternoons.  She has not noted any major cognitive issues  MS History:   About 2010, she had an event with vertigo and diplopia that took 6 months to improve.  After that she began to have issues with her legs.   I have reviewed the MRI of the brain and the spinal cord.   The MRI of the spine shows several T2 weighted lesions, some peripherally located consistent with multiple sclerosis. None of the foci enhanced. The MRI of the brain shows  multiple foci in the periventricular, juxtacortical cortical and deep white matter consistent with multiple sclerosis.   There is also a left cerebellar focus that could represent remote stroke (it is also present on CT scan from 2010)    REVIEW OF SYSTEMS: Constitutional: No fevers, chills, sweats, or change in appetite.   She notes fatigue Eyes: No visual changes, double vision, eye pain Ear, nose and throat: No hearing loss, ear pain, nasal congestion, sore throat.   Has vertigo Cardiovascular: No chest pain, palpitations Respiratory: No shortness of breath at rest or with exertion.   No wheezes GastrointestinaI: No nausea, vomiting, diarrhea, abdominal pain, fecal incontinence Genitourinary: See above. She has not had any recent UTI.Marland Kitchen Musculoskeletal: No neck pain, back pain Integumentary: No rash, pruritus, skin lesions Neurological: as above Psychiatric: No depression at this time.  Mild anxiety Endocrine: No palpitations, diaphoresis, change in appetite, change in weigh or increased thirst Hematologic/Lymphatic: No anemia, purpura, petechiae. Allergic/Immunologic: No itchy/runny eyes, nasal congestion, recent allergic reactions, rashes  ALLERGIES: Allergies  Allergen Reactions   Adderall [Amphetamine-Dextroamphetamine] Other (See Comments)    constipation    HOME MEDICATIONS:  Current Outpatient Medications:    Armodafinil 200 MG TABS, Take one half to one pill qAM, Disp: 30 tablet, Rfl: 5   cholecalciferol (VITAMIN D) 1000 UNITS tablet, Take 5,000 Units by mouth daily. otc vit. d 5,000iu daily/fim , Disp: , Rfl:    CRANBERRY PO, Take 1 capsule by mouth daily., Disp: , Rfl:    dalfampridine 10 MG TB12, TAKE  1 TABLET TWICE DAILY, Disp: 60 tablet, Rfl: 11   diazepam (VALIUM) 5 MG tablet, Take up to three times a day po prn dizziness, Disp: 270 tablet, Rfl: 1   Multiple Vitamin (MULTIVITAMIN) tablet, Take 1 tablet by mouth daily., Disp: , Rfl:    oxybutynin  (DITROPAN) 5 MG tablet, 5 mg daily. , Disp: , Rfl:    TECFIDERA 240 MG CPDR, TAKE 1 CAPSULE BY MOUTH TWICE DAILY, Disp: 60 capsule, Rfl: 11 No current facility-administered medications for this visit.   Facility-Administered Medications Ordered in Other Visits:    botulinum toxin Type A (BOTOX) injection 200 Units, 200 Units, Intramuscular, Once, Jerilee Field, MD   gadopentetate dimeglumine (MAGNEVIST) injection 11 mL, 11 mL, Intravenous, Once PRN, Marney Treloar, Pearletha Furl, MD   gadopentetate dimeglumine (MAGNEVIST) injection 12 mL, 12 mL, Intravenous, Once PRN, Keyla Milone, Pearletha Furl, MD  PAST MEDICAL HISTORY: Past Medical History:  Diagnosis Date   Benign paroxysmal positional vertigo 10/12/2014   Constipation    Family history of breast cancer    Fatigue    Headache    history of migaines   History of kidney stones    History of MRSA infection    History of normocytic normochromic anemia    MS (multiple sclerosis) (HCC)    diagnosed 9/16   PONV (postoperative nausea and vomiting)     PAST SURGICAL HISTORY: Past Surgical History:  Procedure Laterality Date   BREAST LUMPECTOMY Right    benign   COLONOSCOPY     CYSTOSCOPY WITH INJECTION N/A 04/23/2017   Procedure: CYSTOSCOPY WITH INJECTION/ BOTOX 200 UNITS;  Surgeon: Jerilee Field, MD;  Location: [facility];  Service: Urology;  Laterality: N/A;   OOPHORECTOMY Left    2013   TONSILLECTOMY AND ADENOIDECTOMY     age 33   TRANSURETHRAL RESECTION OF BLADDER TUMOR N/A 04/23/2017   Procedure: TRANSURETHRAL RESECTION OF BLADDER TUMOR (TURBT);  Surgeon: Jerilee Field, MD;  Location: [facility];  Service: Urology;  Laterality: N/A;    FAMILY HISTORY: History reviewed. No pertinent family history.  SOCIAL HISTORY:  Social History   Socioeconomic History   Marital status: Married    Spouse name: Not on file   Number of children: Not on file   Years of education: Not on file    Highest education level: Not on file  Occupational History   Not on file  Social Needs   Financial resource strain: Not on file   Food insecurity:    Worry: Not on file    Inability: Not on file   Transportation needs:    Medical: Not on file    Non-medical: Not on file  Tobacco Use   Smoking status: Never Smoker   Smokeless tobacco: Never Used  Substance and Sexual Activity   Alcohol use: Yes    Alcohol/week: 0.0 standard drinks    Comment: rare   Drug use: No   Sexual activity: Not Currently  Lifestyle   Physical activity:    Days per week: Not on file    Minutes per session: Not on file   Stress: Not on file  Relationships   Social connections:    Talks on phone: Not on file    Gets together: Not on file    Attends religious service: Not on file    Active member of club or organization: Not on file    Attends meetings of clubs or organizations: Not on file    Relationship status: Not on  file   Intimate partner violence:    Fear of current or ex partner: Not on file    Emotionally abused: Not on file    Physically abused: Not on file    Forced sexual activity: Not on file  Other Topics Concern   Not on file  Social History Narrative   Not on file     PHYSICAL EXAM  There were no vitals filed for this visit.  There is no height or weight on file to calculate BMI.   General: The patient is well-developed and well-nourished and in no acute distress.   Hearing is symmetric.   Right > left ankle edema.  Neurologic Exam  Mental status: The patient is alert and oriented x 3 at the time of the examination. The patient has apparent normal recent and remote memory, with an apparently normal attention span and concentration ability.   Speech is normal.  Cranial nerves: Extraocular movements shows right nystagmus to right gaze with mildly reduced ability to abduct the right eye and milder nystagmus when she looks to the left..   She has reduced color  vision on the left facial strength and sensation is normal. Trapezius strength is strong.  No obvious hearing deficits are noted.  Motor:  Muscle bulk is normal.   Muscle tone is increased in the legs, right greater than left. Strength is  5 / 5 in the arms and 4+/5 in left leg and 4-/5 in right leg.   Sensory: She has normal sensation to touch and vibration in the arms and normal sensation to touch in the legs.  Vibration sensation was symmetric in the legs. Coordination: Cerebellar testing shows fairly good finger-nose-finger and mildly reduced heel-to-shin.  Gait and station: Station is normal.   She is able to walk within the room not using a cane but walks faster with a cane..   Romberg is negative.   Reflexes: Deep tendon reflexes are symmetric and 3+ bilaterally  and increased in the knees (with spread).  No ankle clonus..     25 foot timed walk was 18.1 seconds (average of 2 trials)    DIAGNOSTIC DATA (LABS, IMAGING, TESTING) - I reviewed patient records, labs, notes, testing and imaging myself where available.     ASSESSMENT AND PLAN  Multiple sclerosis exacerbation (HCC)  Weakness of both legs  Neurogenic bladder  Gait disturbance  Other fatigue   1.   She will continue Tecfidera.  We will check a CBC with differential today.  We will also check a follow-up MRI of the brain to determine if there is any subclinical activity.  If present, consider a switch to a more efficacious medication.   2.    Continue Ampyra for gait. 3.   Continue valium for Vertigo.     4.   Stay active and exercises tolerated.    5.   I will see her back 5 months or sooner if she has new or worsening symptoms.    Shmuel Girgis A. Epimenio Foot, MD, PhD 06/05/2018, 2:55 PM Certified in Neurology, Clinical Neurophysiology, Sleep Medicine, Pain Medicine and Neuroimaging  [facility] [address] [place], [place] [address] [phone]

## 2018-06-05 NOTE — Telephone Encounter (Signed)
-----   Message from Asa Lente, MD sent at 06/05/2018  2:56 PM EDT ----- She will follow-up in 6 months

## 2018-06-09 ENCOUNTER — Telehealth: Payer: Self-pay

## 2018-06-09 NOTE — Telephone Encounter (Signed)
Pending approval for Armodafinil 200 mg tablets [id number] RX #: [id number] PA Case ID: [id number]    Approved from 06/09/2018 through 06/09/2019

## 2018-08-29 ENCOUNTER — Other Ambulatory Visit: Payer: Self-pay | Admitting: Neurology

## 2018-11-12 ENCOUNTER — Telehealth: Payer: Self-pay | Admitting: *Deleted

## 2018-11-12 NOTE — Telephone Encounter (Signed)
Received PA form to be filled out for Tecfidera from [place]. Faxed completed/signed form back at [phone]. Marked urgent. Received fax confirmation and waiting on determination.

## 2018-11-12 NOTE — Telephone Encounter (Signed)
Received fax notification from [place] that [place] approved 11/12/18-11/12/19. Request ID: [id number]. Member ID: [id number].

## 2018-11-12 NOTE — Telephone Encounter (Signed)
Received PA request on C[facility] for Tecfidera. Key: [id number]. Rx #: [id number]. Submitted demographic info and received the following response from [facility]: "Drug is covered by current benefit plan. No further PA activity needed"

## 2018-11-18 ENCOUNTER — Encounter: Payer: Self-pay | Admitting: *Deleted

## 2018-11-18 ENCOUNTER — Other Ambulatory Visit: Payer: Self-pay | Admitting: Neurology

## 2018-11-18 MED ORDER — DIAZEPAM 5 MG PO TABS: ORAL_TABLET | ORAL | 1 refills | Status: DC

## 2018-11-18 NOTE — Addendum Note (Signed)
Addended by: Hope Pigeon on: 11/18/2018 04:22 PM   Modules accepted: Orders

## 2018-11-18 NOTE — Telephone Encounter (Addendum)
Spoke with Dr. Felecia Shelling. [place] for pt to d/c armodafinil. He will send in refill for diazepam. I checked drug registry and she last refilled 04/20/18 #[id number]. Last seen 06/05/2018 and next f/u 01/01/2019

## 2018-11-18 NOTE — Telephone Encounter (Signed)
Pt has called to inform that since on the Armodafinil 200 MG TABS everything has worsen, balance and all.  Pt asking to be taken off of Armodafinil 200 MG TABS.  Pt asking for a refill on her  diazepam (VALIUM) 5 MG tablet [facility]  Pt states it helps with her balance.

## 2018-12-31 ENCOUNTER — Telehealth: Payer: Self-pay | Admitting: Neurology

## 2018-12-31 NOTE — Telephone Encounter (Signed)
I called patient regarding confirming 01/01/19 appointment. Message was left on patient's number listed in chart [phone]) to advise patient to arrive at our office at 8:00 to check-in for 8:30 appointment. I also advised in voicemail that due to ongoing COVID-19 guidelines, it is encouraged that patient come alone to appointment if possible.

## 2019-01-01 ENCOUNTER — Encounter: Payer: Self-pay | Admitting: Neurology

## 2019-01-01 ENCOUNTER — Telehealth: Payer: Self-pay | Admitting: Neurology

## 2019-01-01 ENCOUNTER — Ambulatory Visit (INDEPENDENT_AMBULATORY_CARE_PROVIDER_SITE_OTHER): Payer: Managed Care, Other (non HMO) | Admitting: Neurology

## 2019-01-01 ENCOUNTER — Other Ambulatory Visit: Payer: Self-pay

## 2019-01-01 VITALS — BP 113/75 | HR 73 | Temp 97.9°F | Wt 140.0 lb

## 2019-01-01 DIAGNOSIS — R5383 Other fatigue: Secondary | ICD-10-CM

## 2019-01-01 DIAGNOSIS — N319 Neuromuscular dysfunction of bladder, unspecified: Secondary | ICD-10-CM | POA: Diagnosis not present

## 2019-01-01 DIAGNOSIS — F988 Other specified behavioral and emotional disorders with onset usually occurring in childhood and adolescence: Secondary | ICD-10-CM

## 2019-01-01 DIAGNOSIS — R269 Unspecified abnormalities of gait and mobility: Secondary | ICD-10-CM

## 2019-01-01 DIAGNOSIS — G35 Multiple sclerosis: Secondary | ICD-10-CM | POA: Diagnosis not present

## 2019-01-01 DIAGNOSIS — E559 Vitamin D deficiency, unspecified: Secondary | ICD-10-CM

## 2019-01-01 NOTE — Telephone Encounter (Signed)
[employer] order sent to GI. They will obtain the auth and reach out to the patient to schedule.  °

## 2019-01-01 NOTE — Progress Notes (Signed)
[facility]  PATIENT: Crystal Park DOB: 08/16/1960  REFERRING DOCTOR OR PCP:  Narda Bondshris Newman (ENT referred)   Gildardo Crankerharles Ross (PCP) SOURCE: paitent and records from Dr. Ezzard StandingNewman.    CT images on PACS  _________________________________   HISTORICAL  CHIEF COMPLAINT:  Chief Complaint  Patient presents with   Follow-up    MS follow up pt with husband Mardelle Mattendy and his temp is 97.8    HISTORY OF PRESENT ILLNESS:  Crystal Park is a 58 y.o. woman with multiple sclerosis.      Update 01/01/2019: She is on Tecfidera and tolerates it well.   She feels her MS is stable and gait is a little better.   She is doing better with her cane and walker (uses a walker inside the house and cane/husband outside).   The legs start to feel weak as she walks and she needs to sit down.  Due to spasticity, she takes valium 5 mg bid (tid did not help).    Bladder function is poor and she needs to self-cath 4-6 times a day (sees [facility]).       She is on oxybutynin for bladder spasms.  Her fatigue is doing worse and she needs to nap 1-2 times during the day.    She wakes up at night to cath.  Added up she gets 10 hours or so every day in 3-4 segments, longest at night).   She felt worse when she tried Adderall with poor balance.   Armodafinil had negligible effect.   She feels mood is fine but has some irritability.    STM is a little worse than in the past.     She has done the flu, pneumonic and shingles vaccinations since last visit.   Update 06/05/2018 (video) She is on Tecfidera and tolerates it ok but feesl it makes her more tired.  She denies any exacerbation but is feeling mor tired and generally weak.   This seems worse the last few weeks.   She denies fevers.   She is sleeping poorly getting 6 hours most nights but then taking 2 long naps a day (probalby getting 10 hours sleep/day). .  She notes nocturia keeps her up.   She takes oxybutynin with some bnefiit.   She is  usually in bed many hours a day not sleeping.   She also takes a nap most afternoons  Her gait is doing a little worse.  She uses a walker now more than sue was doing and rarely uses the cane now.   She has one fall where her knees just gave out while using the walker.  She is on Ampyra.   It seemed to help he more initially.  Both legs are weak but right leg is more spastic.  She takes diazepam twice a day.       She feels dizzy when she moves her head.  She takes valium with some benefit.    She denies depression.  She is staying inside more because of Covid-19.   She keeps mentally active.   She does word game puzzles.     She was started on thyroid medication levothyroxine 88 mcg.    Update 10/03/2017: She feels her MS is stable and has no recent exacerbations.   She is on Tecfidera and she tolerates it well.    Her last lymphocyte count was 1.0.       Gait is off and she uses a cane.  She started Ampyra and feels gait is improved some.    She did have a fall.  She looked to the left (turned quickly) and lost balance.    She fell, hitting the washer/dryer.    She hit her head but there was no LOC.   She did not have HA afterwards but her vision felt blurry for under a minute.   She  Notes that she gets dizzy looking left, even when sitting nand she feels off balanced when she does while standing.      She feels strength is better on the right side.    Fatigue is a daily problem.  It is physical > cognitive.    She stopped Adderall due to constipation.    It had not really helped her any.   She feels her mood is variable.   She sometimes gets weepy and sometimes is irritable.  Valiu has helped the anxiety and she takes tid.   It also helps dizziness        Update 05/03/2017: She feels that her MS is mostly stable though her gait is slightly worse her fatigue is significantly worse. She is on Tecfidera 240 mg twice a day and she tolerates it well. She uses a cane to ambulate and has weakness in  both legs.   Her arms are just minimally weak.     She notes her fatigue is more severe.    It is physical > cognitive.      Adderall has not helped her fatigue much.    She doe snot note much congnitive issues unless she gets very tired.    The Adderall has not helped the cognitive issues much.     Amantadine may have helped some but caused ankle swelling.    Learta Coddingmyra was only tried a short time and while she was having a lot of urinary tract infection so they are uncertain where the not it helped a lot. She could not tolerate Nuvigil (worse constipation)  Her dizziness is doing fairly well on Valium 3 times a day as needed.  Constipation is worse.   Miralax did not help much.  The GI advised dulcolax suppositories every few days.    She had urodynamics.   She had urinary retention and spasming.   She self-caths.    Botox has helped the bladder spasms and associated pain.     From 12/03/2016: MS:   She is on Tecfidera tolerates it well. She has not had any definite exacerbation.   However, fatigue s much worse.  Brain and spine MRIs did not show any new lesions in Feb 2018.    Gait/strength/sensation:  She uses a cane mostly due to poor balance and right leg weakness/spasticity.   She uses a walker if more tired..   She notes right weakness and clumsiness.  She feels the leg is doing better than earlier this year.   Her right arm is doing better than earlier this year.     Ampyra did not help so she stopped.     Vertigo:   She reports bouts of vertigo, helped by valium (takes twice most days, occ tid)    In the past, she saw ENT and had ENG testing. It showed a central vertigo. Parenteral and other exercises slightly helped. Valium helps more.  Vision:   She denies any difficulty with her vision.   Bladder:   She had a recent UTI and nitrofurantoin was started but she couldn't tolerate it  well due to nausea.   She is doing catherizes 6 times a day and that is well tolerated.   She is now seeing  urology (sees Dr. Junius Roads at [facility])  She had urosepsis in February 2018.   She is on oxybutynin for bladder spasms.   She is not have any incontinence with this regimen  Fatigue/sleep:   Her fatigue is worse(mental more than physical).   We tried amantadine but it caused edema and she stopped.   Adderall was not well tolerated.    She wakes up once or twice a night because of her bladder..  Mood/Cognitive:   She denies any depression or anxiety. Sometimes she feels a little bit more irritable and afternoons.  She has not noted any major cognitive issues  MS History:   About 2010, she had an event with vertigo and diplopia that took 6 months to improve.  After that she began to have issues with her legs.   I have reviewed the MRI of the brain and the spinal cord.   The MRI of the spine shows several T2 weighted lesions, some peripherally located consistent with multiple sclerosis. None of the foci enhanced. The MRI of the brain shows multiple foci in the periventricular, juxtacortical cortical and deep white matter consistent with multiple sclerosis.   There is also a left cerebellar focus that could represent remote stroke (it is also present on CT scan from 2010)    REVIEW OF SYSTEMS: Constitutional: No fevers, chills, sweats, or change in appetite.   She notes fatigue Eyes: No visual changes, double vision, eye pain Ear, nose and throat: No hearing loss, ear pain, nasal congestion, sore throat.   Has vertigo Cardiovascular: No chest pain, palpitations Respiratory: No shortness of breath at rest or with exertion.   No wheezes GastrointestinaI: No nausea, vomiting, diarrhea, abdominal pain, fecal incontinence Genitourinary: See above. She has not had any recent UTI.Marland Kitchen Musculoskeletal: No neck pain, back pain Integumentary: No rash, pruritus, skin lesions Neurological: as above Psychiatric: No depression at this time.  Mild anxiety Endocrine: No palpitations, diaphoresis, change in  appetite, change in weigh or increased thirst Hematologic/Lymphatic: No anemia, purpura, petechiae. Allergic/Immunologic: No itchy/runny eyes, nasal congestion, recent allergic reactions, rashes  ALLERGIES: Allergies  Allergen Reactions   Adderall [Amphetamine-Dextroamphetamine] Other (See Comments)    constipation    HOME MEDICATIONS:  Current Outpatient Medications:    alendronate (FOSAMAX) 70 MG tablet, , Disp: , Rfl:    cholecalciferol (VITAMIN D) 1000 UNITS tablet, Take 5,000 Units by mouth daily. otc vit. d 5,000iu daily/fim , Disp: , Rfl:    CRANBERRY PO, Take 1 capsule by mouth daily., Disp: , Rfl:    dalfampridine 10 MG TB12, TAKE 1 TABLET TWICE DAILY, Disp: 60 tablet, Rfl: 11   diazepam (VALIUM) 5 MG tablet, Take up to three times a day po prn dizziness, Disp: 270 tablet, Rfl: 1   Dimethyl Fumarate (TECFIDERA) 240 MG CPDR, Tecfidera 240 mg capsule,delayed release, Disp: , Rfl:    levothyroxine (SYNTHROID) 88 MCG tablet, levothyroxine 88 mcg tablet, Disp: , Rfl:    Multiple Vitamin (MULTIVITAMIN) tablet, Take 1 tablet by mouth daily., Disp: , Rfl:    oxybutynin (DITROPAN) 5 MG tablet, 5 mg daily. , Disp: , Rfl:  No current facility-administered medications for this visit.   Facility-Administered Medications Ordered in Other Visits:    botulinum toxin Type A (BOTOX) injection 200 Units, 200 Units, Intramuscular, Once, Jerilee Field, MD   gadopentetate dimeglumine (MAGNEVIST) injection  11 mL, 11 mL, Intravenous, Once PRN, Deania Siguenza, Pearletha Furl, MD   gadopentetate dimeglumine (MAGNEVIST) injection 12 mL, 12 mL, Intravenous, Once PRN, Huberta Tompkins, Pearletha Furl, MD  PAST MEDICAL HISTORY: Past Medical History:  Diagnosis Date   Benign paroxysmal positional vertigo 10/12/2014   Constipation    Family history of breast cancer    Fatigue    Headache    history of migaines   History of kidney stones    History of MRSA infection    History of normocytic normochromic  anemia    MS (multiple sclerosis) (HCC)    diagnosed 9/16   PONV (postoperative nausea and vomiting)     PAST SURGICAL HISTORY: Past Surgical History:  Procedure Laterality Date   BREAST LUMPECTOMY Right    benign   COLONOSCOPY     CYSTOSCOPY WITH INJECTION N/A 04/23/2017   Procedure: CYSTOSCOPY WITH INJECTION/ BOTOX 200 UNITS;  Surgeon: Jerilee Field, MD;  Location: [facility];  Service: Urology;  Laterality: N/A;   OOPHORECTOMY Left    2013   TONSILLECTOMY AND ADENOIDECTOMY     age 42   TRANSURETHRAL RESECTION OF BLADDER TUMOR N/A 04/23/2017   Procedure: TRANSURETHRAL RESECTION OF BLADDER TUMOR (TURBT);  Surgeon: Jerilee Field, MD;  Location: [facility];  Service: Urology;  Laterality: N/A;    FAMILY HISTORY: History reviewed. No pertinent family history.  SOCIAL HISTORY:  Social History   Socioeconomic History   Marital status: Married    Spouse name: Not on file   Number of children: Not on file   Years of education: Not on file   Highest education level: Not on file  Occupational History   Not on file  Social Needs   Financial resource strain: Not on file   Food insecurity    Worry: Not on file    Inability: Not on file   Transportation needs    Medical: Not on file    Non-medical: Not on file  Tobacco Use   Smoking status: Never Smoker   Smokeless tobacco: Never Used  Substance and Sexual Activity   Alcohol use: Yes    Alcohol/week: 0.0 standard drinks    Comment: rare   Drug use: No   Sexual activity: Not Currently  Lifestyle   Physical activity    Days per week: Not on file    Minutes per session: Not on file   Stress: Not on file  Relationships   Social connections    Talks on phone: Not on file    Gets together: Not on file    Attends religious service: Not on file    Active member of club or organization: Not on file    Attends meetings of clubs or organizations: Not on file     Relationship status: Not on file   Intimate partner violence    Fear of current or ex partner: Not on file    Emotionally abused: Not on file    Physically abused: Not on file    Forced sexual activity: Not on file  Other Topics Concern   Not on file  Social History Narrative   Not on file     PHYSICAL EXAM  Vitals:   01/01/19 0816  BP: 113/75  Pulse: 73  Temp: 97.9 F (36.6 C)  Weight: 140 lb (63.5 kg)    Body mass index is 22.6 kg/m.   General: The patient is well-developed and well-nourished and in no acute distress.   Hearing is symmetric.  Right > left ankle edema.  Neurologic Exam  Mental status: The patient is alert and oriented x 3 at the time of the examination. The patient has apparent normal recent and remote memory, with an apparently normal attention span and concentration ability.   Speech is normal.  Cranial nerves: Extraocular movements shows right nystagmus to right gaze with mildly reduced ability to abduct the right eye and milder nystagmus when she looks to the left.. There is mildly reduced color vision OS.  Facial strength and sensation is normal. Trapezius strength is strong.  No obvious hearing deficits are noted.  Motor:  Muscle bulk is normal.   Muscle tone is increased in the legs, right greater than left. Strength is  5 / 5 in the arms though slightly reduced left RAM  and 4/5 in left leg and 4-/5 in right leg.   Sensory: She has normal sensation to touch and vibration..  Coordination: Cerebellar testing shows fairly good finger-nose-finger and reduced heel-to-shin.  Gait and station: Station is normal though she uses one hand to rise from chair..   She is able to walk within the room not using a cane but walks faster with a cane..   Romberg is negative.   Reflexes: Deep tendon reflexes are symmetric and 3+ bilaterally  and increased in the knees (with spread).  No ankle clonus.Marland Kitchen         DIAGNOSTIC DATA (LABS, IMAGING, TESTING) - I  reviewed patient records, labs, notes, testing and imaging myself where available.     ASSESSMENT AND PLAN  MS (multiple sclerosis) ([place]) - Plan: CBC with Differential/Platelet  Gait disturbance  Attention deficit disorder, unspecified hyperactivity presence  Neurogenic bladder  Other fatigue  Vitamin D deficiency - Plan: VITAMIN D 25 Hydroxy (Vit-D Deficiency, Fractures)  Multiple sclerosis (HCC) - Plan: MR BRAIN W WO CONTRAST   1.   Continue Tecfidera.  Check CBC with differential.    We will check a CBC with differential today.  We will also check a follow-up MRI of the brain to determine if there is any subclinical activity.  If present, consider a switch to a more efficacious medication.   2.   She will continue Ampyra for gait and Valium for vertigo and spasticity. 3.   Stay active and exercises tolerated.    4.   I will see her back 6 months or sooner if she has new or worsening symptoms.    Makala Fetterolf A. Felecia Shelling, MD, PhD 88/50/2774, 1:28 AM Certified in Neurology, Clinical Neurophysiology, Sleep Medicine, Pain Medicine and Neuroimaging  [facility] [address], [place], [place] [address] [phone]

## 2019-01-02 LAB — CBC WITH DIFFERENTIAL/PLATELET
Basophils Absolute: 0.1 10*3/uL (ref 0.0–0.2)
Basos: 1 %
EOS (ABSOLUTE): 0.2 10*3/uL (ref 0.0–0.4)
Eos: 3 %
Hematocrit: 41.4 % (ref 34.0–46.6)
Hemoglobin: 13.9 g/dL (ref 11.1–15.9)
Immature Grans (Abs): 0 10*3/uL (ref 0.0–0.1)
Immature Granulocytes: 0 %
Lymphocytes Absolute: 1.5 10*3/uL (ref 0.7–3.1)
Lymphs: 26 %
MCH: 31.4 pg (ref 26.6–33.0)
MCHC: 33.6 g/dL (ref 31.5–35.7)
MCV: 94 fL (ref 79–97)
Monocytes Absolute: 0.7 10*3/uL (ref 0.1–0.9)
Monocytes: 13 %
Neutrophils Absolute: 3.3 10*3/uL (ref 1.4–7.0)
Neutrophils: 57 %
Platelets: 316 10*3/uL (ref 150–450)
RBC: 4.42 x10E6/uL (ref 3.77–5.28)
RDW: 12.4 % (ref 11.7–15.4)
WBC: 5.7 10*3/uL (ref 3.4–10.8)

## 2019-01-02 LAB — VITAMIN D 25 HYDROXY (VIT D DEFICIENCY, FRACTURES): Vit D, 25-Hydroxy: 65.8 ng/mL (ref 30.0–100.0)

## 2019-01-05 ENCOUNTER — Telehealth: Payer: Self-pay | Admitting: *Deleted

## 2019-01-05 NOTE — Telephone Encounter (Signed)
-----   Message from Britt Bottom, MD sent at 01/02/2019  1:52 PM EDT ----- Please let the patient know that the lab work is fine.

## 2019-01-14 ENCOUNTER — Telehealth: Payer: Self-pay | Admitting: *Deleted

## 2019-01-14 DIAGNOSIS — Z0289 Encounter for other administrative examinations: Secondary | ICD-10-CM

## 2019-01-14 NOTE — Telephone Encounter (Signed)
Gave completed/signed form from [employer]Attending Physicians statement-progress report back to medical records to process for pt.

## 2019-01-23 ENCOUNTER — Other Ambulatory Visit: Payer: Self-pay

## 2019-01-23 ENCOUNTER — Ambulatory Visit
Admission: RE | Admit: 2019-01-23 | Discharge: 2019-01-23 | Disposition: A | Discharge status: Home / Self Care | Payer: Managed Care, Other (non HMO) | Source: Ambulatory Visit | Attending: Neurology | Admitting: Neurology

## 2019-01-23 DIAGNOSIS — G35 Multiple sclerosis: Secondary | ICD-10-CM

## 2019-01-23 MED ORDER — GADOBENATE DIMEGLUMINE 529 MG/ML IV SOLN
13.0000 mL | Freq: Once | INTRAVENOUS | Status: AC | PRN
  Administered 2019-01-23: 13 mL via INTRAVENOUS

## 2019-01-26 ENCOUNTER — Telehealth: Payer: Self-pay

## 2019-01-26 NOTE — Telephone Encounter (Signed)
-----   Message from Britt Bottom, MD sent at 01/26/2019  3:17 PM EST ----- Please let her know that the MRI was stable.  No new MS lesions.

## 2019-01-26 NOTE — Telephone Encounter (Signed)
I reached out to the pt and advised of MRI results.  Pt verbalized understanding.

## 2019-02-04 ENCOUNTER — Telehealth: Payer: Self-pay | Admitting: Neurology

## 2019-02-04 NOTE — Telephone Encounter (Signed)
Received a cover my meds request to complete PA for the patient for Dalfampridine. PA completed but when submitted it replied, "Drug is covered by current benefit plan. No further PA activity needed This PA cannot be submitted. Please refer to the other notes for further details."  Drug is covered for the patient.

## 2019-03-12 ENCOUNTER — Telehealth: Payer: Self-pay | Admitting: *Deleted

## 2019-03-12 DIAGNOSIS — G35 Multiple sclerosis: Secondary | ICD-10-CM

## 2019-03-12 NOTE — Telephone Encounter (Signed)
Submitted PA Tecfidera on CMM. [id number]. Waiting on determination from [facility].

## 2019-03-16 MED ORDER — DIMETHYL FUMARATE 240 MG PO CPDR: DELAYED_RELEASE_CAPSULE | ORAL | 11 refills | Status: DC

## 2019-03-16 NOTE — Telephone Encounter (Addendum)
PA denied. Pt must try/fail generic dimethyl fumarate first. I called pt. She is agreeable to switch to generic. I sent in new rx to [facility] for her. Advised we may need to do a PA but we will work on this if one is needed. She verbalized understanding.

## 2019-03-16 NOTE — Addendum Note (Signed)
Addended by: Hillis Range on: 03/16/2019 05:21 PM   Modules accepted: Orders

## 2019-03-17 NOTE — Telephone Encounter (Signed)
Tried initiating PA dimethyl fumarate on CMM. [id number] - PA Case ID: [id number].  Received the following response: "No Prior Authorization is required at this time based on the alternative drug you chose to prescribe.;CaseId:[id number];Status:Cancelled;"

## 2019-04-08 ENCOUNTER — Telehealth: Payer: Self-pay | Admitting: *Deleted

## 2019-04-08 NOTE — Telephone Encounter (Signed)
Took call from phone staff. Spoke with Research scientist (physical sciences) at [employer] [facility] [place]. They are requesting letter from Dr. Epimenio Foot clearing pt for surgery (hysteroscopy D&C) scheduled for 04/29/19. Advised I will send message to Dr. Epimenio Foot. Fax: [phone].

## 2019-04-09 ENCOUNTER — Encounter: Payer: Self-pay | Admitting: Neurology

## 2019-04-09 NOTE — Telephone Encounter (Signed)
Took call from phone staff and spoke with [facility]. She noticed I had wrong fax number documented. Correct fax number: [phone].

## 2019-04-09 NOTE — Progress Notes (Signed)
   04/09/2019   XN:[id number] M Cabeza  1960-07-11   [facility] [place] Fax:  [phone]  To whom it may concern:  Crystal Park is a patient at [facility] where she is followed for multiple sclerosis.  She is neurologically cleared to proceed with hysteroscopy with D&C or similar procedures.  No changes in medications are required.  Please call if there are any questions   Sincerely,   Richie Vadala A. Epimenio Foot, MD, PhD, FAAN Certified in Neurology, Clinical Neurophysiology, Sleep Medicine, Pain Medicine and Neuroimaging Director, Multiple Sclerosis Center at [facility]  [facility] [address], [address] [place], [place] [address] [phone]

## 2019-04-09 NOTE — Progress Notes (Signed)
     04/09/2019  Re: HALLIE ERTL [birth year]   To whom it may concern:  Glenna Fellows is followed at the MS center at [facility] for multiple sclerosis.  She is neurologically cleared to proceed with hysteroscopy with D&C all related procedures.  Please let me know if you have any questions  Sincerely,   Laticha Ferrucci A. Epimenio Foot, MD, PhD, FAAN Certified in Neurology, Clinical Neurophysiology, Sleep Medicine, Pain Medicine and Neuroimaging Director, Multiple Sclerosis Center at [facility]  [facility] [address] [place], [place] [address] [phone]

## 2019-04-13 ENCOUNTER — Encounter: Payer: Self-pay | Admitting: Neurology

## 2019-04-13 NOTE — Telephone Encounter (Signed)
I have faxed the letter to [facility].

## 2019-04-13 NOTE — Telephone Encounter (Signed)
Lette ris signed on ledge

## 2019-04-13 NOTE — Progress Notes (Signed)
    04/13/2019  Re: ALLEE BUSK  1960-11-30  [facility] [place]. Fax: [phone].  To Whom It May Concern:  REMINGTYN DEPAOLA is a patient at the multiple sclerosis Center at [facility] where she is followed for MS.  She is neurologically cleared to proceed with gynecologic procedure such as hysteroscopy/D&C  Please let me know if there are any questions.  Sincerely,   Kyrel Leighton A. Epimenio Foot, MD, PhD, FAAN Certified in Neurology, Clinical Neurophysiology, Sleep Medicine, Pain Medicine and Neuroimaging Director, Multiple Sclerosis Center at [facility]  [facility] [address] [place], [place] [address] [phone]

## 2019-04-29 ENCOUNTER — Other Ambulatory Visit: Payer: Self-pay | Admitting: Obstetrics and Gynecology

## 2019-04-30 ENCOUNTER — Other Ambulatory Visit: Payer: Self-pay | Admitting: *Deleted

## 2019-04-30 MED ORDER — DALFAMPRIDINE ER 10 MG PO TB12: 10.0000 mg | ORAL_TABLET | Freq: Two times a day (BID) | ORAL | 11 refills | Status: DC

## 2019-07-06 ENCOUNTER — Other Ambulatory Visit: Payer: Self-pay

## 2019-07-06 ENCOUNTER — Ambulatory Visit (INDEPENDENT_AMBULATORY_CARE_PROVIDER_SITE_OTHER): Payer: Managed Care, Other (non HMO) | Admitting: Neurology

## 2019-07-06 ENCOUNTER — Telehealth: Payer: Self-pay | Admitting: *Deleted

## 2019-07-06 ENCOUNTER — Encounter: Payer: Self-pay | Admitting: Neurology

## 2019-07-06 VITALS — BP 120/85 | HR 84 | Temp 97.4°F | Ht 66.0 in | Wt 145.5 lb

## 2019-07-06 DIAGNOSIS — R269 Unspecified abnormalities of gait and mobility: Secondary | ICD-10-CM

## 2019-07-06 DIAGNOSIS — N319 Neuromuscular dysfunction of bladder, unspecified: Secondary | ICD-10-CM

## 2019-07-06 DIAGNOSIS — G35 Multiple sclerosis: Secondary | ICD-10-CM | POA: Diagnosis not present

## 2019-07-06 DIAGNOSIS — R5383 Other fatigue: Secondary | ICD-10-CM

## 2019-07-06 DIAGNOSIS — F988 Other specified behavioral and emotional disorders with onset usually occurring in childhood and adolescence: Secondary | ICD-10-CM | POA: Diagnosis not present

## 2019-07-06 DIAGNOSIS — E559 Vitamin D deficiency, unspecified: Secondary | ICD-10-CM | POA: Diagnosis not present

## 2019-07-06 MED ORDER — DIAZEPAM 5 MG PO TABS: ORAL_TABLET | ORAL | 1 refills | Status: DC

## 2019-07-06 MED ORDER — ARMODAFINIL 200 MG PO TABS: ORAL_TABLET | ORAL | 5 refills | Status: DC

## 2019-07-06 NOTE — Progress Notes (Signed)
[facility]  PATIENT: Crystal Park DOB: August 13, 1960  REFERRING DOCTOR OR PCP:  Narda Bonds (ENT referred)   [name] (PCP) SOURCE: paitent and records from Dr. Ezzard Standing.    CT images on PACS  _________________________________   HISTORICAL  CHIEF COMPLAINT:  Chief Complaint  Patient presents with  . Follow-up    RM 12 with spouse, temp: 97.2. Last seen 01/01/2019.   . Multiple Sclerosis    On dimethyl fumarate (generic). More faitgued.   . Gait Problem    Ambulates with cane. Has had two falls since last seen. One of the falls she hit her head on washing machine. She denies any problems post fall, she did not get evaluated after this fall.    HISTORY OF PRESENT ILLNESS:  Crystal Park is a 59 y.o. woman with relapsing remitting multiple sclerosis.      Update 07/06/19: She is on Tecfidera and tolerates it well.  No definite new neurologic symptoms but fatigue is worse.   She has fatigue, worse with taking a shower or walking further (uses cane).      Gait is poor and she needs a cane or walker.   She has a recent fall in the laundry room, hitting her head but no LOC.   She is weaker in the right leg than left leg    Not much spasticity.    She needs to self cath (sees [facility]).  She is on oxybutynin for bladder spasms.  Besides fatigue she notes difficulty with focus, attention and short term fatigue.   She denies difficulty with word finding.   Adderall had not helped her and she noted constipation.  She sleeps poorly (has to wake up to cath at 1 am and then wakes up at 5 am to take her thyroid medication.   She goes to bed around 9 pm  Since the last visit she has had her Covid vaccination.  She had a uterine cyst removed.   Update 01/01/2019: She is on Tecfidera and tolerates it well.   She feels her MS is stable and gait is a little better.   She is doing better with her cane and walker (uses a walker inside the house and cane/husband outside).    The legs start to feel weak as she walks and she needs to sit down.  Due to spasticity, she takes valium 5 mg bid (tid did not help).    Bladder function is poor and she needs to self-cath 4-6 times a day (sees [facility]).       She is on oxybutynin for bladder spasms.  Her fatigue is doing worse and she needs to nap 1-2 times during the day.    She wakes up at night to cath.  Added up she gets 10 hours or so every day in 3-4 segments, longest at night).   She felt worse when she tried Adderall with poor balance.   Armodafinil had negligible effect.   She feels mood is fine but has some irritability.    STM is a little worse than in the past.     She has done the flu, pneumonic and shingles vaccinations since last visit.   Update 06/05/2018 (video) She is on Tecfidera and tolerates it ok but feesl it makes her more tired.  She denies any exacerbation but is feeling mor tired and generally weak.   This seems worse the last few weeks.   She denies fevers.   She  is sleeping poorly getting 6 hours most nights but then taking 2 long naps a day (probalby getting 10 hours sleep/day). .  She notes nocturia keeps her up.   She takes oxybutynin with some bnefiit.   She is usually in bed many hours a day not sleeping.   She also takes a nap most afternoons  Her gait is doing a little worse.  She uses a walker now more than sue was doing and rarely uses the cane now.   She has one fall where her knees just gave out while using the walker.  She is on Ampyra.   It seemed to help he more initially.  Both legs are weak but right leg is more spastic.  She takes diazepam twice a day.       She feels dizzy when she moves her head.  She takes valium with some benefit.    She denies depression.  She is staying inside more because of Covid-19.   She keeps mentally active.   She does word game puzzles.     She was started on thyroid medication levothyroxine 88 mcg.   Update 10/03/2017: She feels her MS is stable and has  no recent exacerbations.   She is on Tecfidera and she tolerates it well.    Her last lymphocyte count was 1.0.       Gait is off and she uses a cane.    She started Ampyra and feels gait is improved some.    She did have a fall.  She looked to the left (turned quickly) and lost balance.    She fell, hitting the washer/dryer.    She hit her head but there was no LOC.   She did not have HA afterwards but her vision felt blurry for under a minute.   She  Notes that she gets dizzy looking left, even when sitting nand she feels off balanced when she does while standing.      She feels strength is better on the right side.    Fatigue is a daily problem.  It is physical > cognitive.    She stopped Adderall due to constipation.    It had not really helped her any.   She feels her mood is variable.   She sometimes gets weepy and sometimes is irritable.  Valiu has helped the anxiety and she takes tid.   It also helps dizziness        Update 05/03/2017: She feels that her MS is mostly stable though her gait is slightly worse her fatigue is significantly worse. She is on Tecfidera 240 mg twice a day and she tolerates it well. She uses a cane to ambulate and has weakness in both legs.   Her arms are just minimally weak.     She notes her fatigue is more severe.    It is physical > cognitive.      Adderall has not helped her fatigue much.    She doe snot note much congnitive issues unless she gets very tired.    The Adderall has not helped the cognitive issues much.     Amantadine may have helped some but caused ankle swelling.    Learta Codding was only tried a short time and while she was having a lot of urinary tract infection so they are uncertain where the not it helped a lot. She could not tolerate Nuvigil (worse constipation)  Her dizziness is doing fairly well on Valium 3 times  a day as needed.  Constipation is worse.   Miralax did not help much.  The GI advised dulcolax suppositories every few days.    She had  urodynamics.   She had urinary retention and spasming.   She self-caths.    Botox has helped the bladder spasms and associated pain.     From 12/03/2016: MS:   She is on Tecfidera tolerates it well. She has not had any definite exacerbation.   However, fatigue s much worse.  Brain and spine MRIs did not show any new lesions in Feb 2018.    Gait/strength/sensation:  She uses a cane mostly due to poor balance and right leg weakness/spasticity.   She uses a walker if more tired..   She notes right weakness and clumsiness.  She feels the leg is doing better than earlier this year.   Her right arm is doing better than earlier this year.     Ampyra did not help so she stopped.     Vertigo:   She reports bouts of vertigo, helped by valium (takes twice most days, occ tid)    In the past, she saw ENT and had ENG testing. It showed a central vertigo. Parenteral and other exercises slightly helped. Valium helps more.  Vision:   She denies any difficulty with her vision.   Bladder:   She had a recent UTI and nitrofurantoin was started but she couldn't tolerate it well due to nausea.   She is doing catherizes 6 times a day and that is well tolerated.   She is now seeing urology (sees Dr. Junius Roads at [facility])  She had urosepsis in February 2018.   She is on oxybutynin for bladder spasms.   She is not have any incontinence with this regimen  Fatigue/sleep:   Her fatigue is worse(mental more than physical).   We tried amantadine but it caused edema and she stopped.   Adderall was not well tolerated.    She wakes up once or twice a night because of her bladder..  Mood/Cognitive:   She denies any depression or anxiety. Sometimes she feels a little bit more irritable and afternoons.  She has not noted any major cognitive issues  MS History:   About 2010, she had an event with vertigo and diplopia that took 6 months to improve.  After that she began to have issues with her legs.   I have reviewed the MRI of the brain  and the spinal cord.   The MRI of the spine shows several T2 weighted lesions, some peripherally located consistent with multiple sclerosis. None of the foci enhanced. The MRI of the brain shows multiple foci in the periventricular, juxtacortical cortical and deep white matter consistent with multiple sclerosis.   There is also a left cerebellar focus that could represent remote stroke (it is also present on CT scan from 2010)    REVIEW OF SYSTEMS: Constitutional: No fevers, chills, sweats, or change in appetite.   She notes fatigue Eyes: No visual changes, double vision, eye pain Ear, nose and throat: No hearing loss, ear pain, nasal congestion, sore throat.   Has vertigo Cardiovascular: No chest pain, palpitations Respiratory: No shortness of breath at rest or with exertion.   No wheezes GastrointestinaI: No nausea, vomiting, diarrhea, abdominal pain, fecal incontinence Genitourinary: See above. She has not had any recent UTI.Marland Kitchen Musculoskeletal: No neck pain, back pain Integumentary: No rash, pruritus, skin lesions Neurological: as above Psychiatric: No depression at this time.  Mild  anxiety Endocrine: No palpitations, diaphoresis, change in appetite, change in weigh or increased thirst Hematologic/Lymphatic: No anemia, purpura, petechiae. Allergic/Immunologic: No itchy/runny eyes, nasal congestion, recent allergic reactions, rashes  ALLERGIES: Allergies  Allergen Reactions  . Adderall [Amphetamine-Dextroamphetamine] Other (See Comments)    constipation    HOME MEDICATIONS:  Current Outpatient Medications:  .  cholecalciferol (VITAMIN D) 1000 UNITS tablet, Take 5,000 Units by mouth daily. otc vit. d 5,000iu daily/fim , Disp: , Rfl:  .  CRANBERRY PO, Take 1 capsule by mouth daily., Disp: , Rfl:  .  dalfampridine 10 MG TB12, Take 1 tablet (10 mg total) by mouth 2 (two) times daily., Disp: 60 tablet, Rfl: 11 .  diazepam (VALIUM) 5 MG tablet, Take up to three times a day po prn  dizziness, Disp: 270 tablet, Rfl: 1 .  Dimethyl Fumarate (TECFIDERA) 240 MG CPDR, Take 1 capsule by mouth twice daily, Disp: 60 capsule, Rfl: 11 .  levothyroxine (SYNTHROID) 88 MCG tablet, levothyroxine 88 mcg tablet, Disp: , Rfl:  .  Multiple Vitamin (MULTIVITAMIN) tablet, Take 1 tablet by mouth daily., Disp: , Rfl:  .  oxybutynin (DITROPAN) 5 MG tablet, 5 mg daily. , Disp: , Rfl:  .  alendronate (FOSAMAX) 70 MG tablet, , Disp: , Rfl:  .  Armodafinil 200 MG TABS, One po qAm, Disp: 30 tablet, Rfl: 5 No current facility-administered medications for this visit.  Facility-Administered Medications Ordered in Other Visits:  .  botulinum toxin Type A (BOTOX) injection 200 Units, 200 Units, Intramuscular, Once, Jerilee Field, MD .  gadopentetate dimeglumine (MAGNEVIST) injection 11 mL, 11 mL, Intravenous, Once PRN, Haylea Schlichting A, MD .  gadopentetate dimeglumine (MAGNEVIST) injection 12 mL, 12 mL, Intravenous, Once PRN, Alizon Schmeling, Pearletha Furl, MD  PAST MEDICAL HISTORY: Past Medical History:  Diagnosis Date  . Benign paroxysmal positional vertigo 10/12/2014  . Constipation   . Family history of breast cancer   . Fatigue   . Headache    history of migaines  . History of kidney stones   . History of MRSA infection   . History of normocytic normochromic anemia   . MS (multiple sclerosis) (HCC)    diagnosed 9/16  . PONV (postoperative nausea and vomiting)     PAST SURGICAL HISTORY: Past Surgical History:  Procedure Laterality Date  . BREAST LUMPECTOMY Right    benign  . COLONOSCOPY    . CYSTOSCOPY WITH INJECTION N/A 04/23/2017   Procedure: CYSTOSCOPY WITH INJECTION/ BOTOX 200 UNITS;  Surgeon: Jerilee Field, MD;  Location: [facility];  Service: Urology;  Laterality: N/A;  . OOPHORECTOMY Left    2013  . TONSILLECTOMY AND ADENOIDECTOMY     age 33  . TRANSURETHRAL RESECTION OF BLADDER TUMOR N/A 04/23/2017   Procedure: TRANSURETHRAL RESECTION OF BLADDER TUMOR (TURBT);   Surgeon: Jerilee Field, MD;  Location: [facility];  Service: Urology;  Laterality: N/A;    FAMILY HISTORY: History reviewed. No pertinent family history.  SOCIAL HISTORY:  Social History   Socioeconomic History  . Marital status: Married    Spouse name: Not on file  . Number of children: Not on file  . Years of education: Not on file  . Highest education level: Not on file  Occupational History  . Not on file  Tobacco Use  . Smoking status: Never Smoker  . Smokeless tobacco: Never Used  Substance and Sexual Activity  . Alcohol use: Yes    Alcohol/week: 0.0 standard drinks    Comment: rare  .  Drug use: No  . Sexual activity: Not Currently  Other Topics Concern  . Not on file  Social History Narrative  . Not on file   Social Determinants of Health   Financial Resource Strain:   . Difficulty of Paying Living Expenses:   Food Insecurity:   . Worried About Programme researcher, broadcasting/film/video in the Last Year:   . Barista in the Last Year:   Transportation Needs:   . Freight forwarder (Medical):   Marland Kitchen Lack of Transportation (Non-Medical):   Physical Activity:   . Days of Exercise per Week:   . Minutes of Exercise per Session:   Stress:   . Feeling of Stress :   Social Connections:   . Frequency of Communication with Friends and Family:   . Frequency of Social Gatherings with Friends and Family:   . Attends Religious Services:   . Active Member of Clubs or Organizations:   . Attends Banker Meetings:   Marland Kitchen Marital Status:   Intimate Partner Violence:   . Fear of Current or Ex-Partner:   . Emotionally Abused:   Marland Kitchen Physically Abused:   . Sexually Abused:      PHYSICAL EXAM  Vitals:   07/06/19 0821  BP: 120/85  Pulse: 84  Temp: (!) 97.4 F (36.3 C)  Weight: 145 lb 8 oz (66 kg)  Height:  (1.676 m)    Body mass index is 23.48 kg/m.   General: The patient is well-developed and well-nourished and in no acute distress.    Hearing is symmetric.   Right > left ankle edema.  Neurologic Exam  Mental status: The patient is alert and oriented x 3 at the time of the examination. The patient has apparent normal recent and remote memory, with an apparently normal attention span and concentration ability.   Speech is normal.  Cranial nerves: Extraocular movements shows right nystagmus to right gaze with mildly reduced ability to abduct the right eye and milder nystagmus when she looks to the left.. There is mildly reduced color vision OS.  Facial strength and sensation is normal. Trapezius strength is strong.  No obvious hearing deficits are noted.  Motor:  Muscle bulk is normal.   Muscle tone is increased in the legs, right greater than left. Strength is  5 / 5 in the arms though slightly reduced left RAM  and 4/5 in left leg and 4-/5 in right leg.   Sensory: She has normal sensation to touch and vibration..  Coordination: Cerebellar testing shows fairly good finger-nose-finger and reduced heel-to-shin.  Gait and station: Station is normal though she uses one hand to rise from chair..   She is able to walk within the room not using a cane but walks faster with a cane..   Romberg is negative.   Reflexes: Deep tendon reflexes are symmetric and 3+ bilaterally  and increased in the knees (with spread).  No ankle clonus.Marland Kitchen         DIAGNOSTIC DATA (LABS, IMAGING, TESTING) - I reviewed patient records, labs, notes, testing and imaging myself where available.     ASSESSMENT AND PLAN  Multiple sclerosis (HCC)  Gait disturbance  Attention deficit disorder, unspecified hyperactivity presence  Vitamin D deficiency  Neurogenic bladder  Other fatigue   1.   Continue Tecfidera.  Lab work was fine recently.Marland Kitchen  MRI of the brain 01/23/2019 showed no new lesions 2.   Armodafinil for fatigue and sleepiness,  continue Ampyra for gait  and Valium for vertigo and spasticity. 3.   Stay active and exercises tolerated.    4.    I will see her back 6 months or sooner if she has new or worsening symptoms.    Thais Silberstein A. Felecia Shelling, MD, PhD 06/09/3401, 7:09 PM Certified in Neurology, Clinical Neurophysiology, Sleep Medicine, Pain Medicine and Neuroimaging  [facility] [address], [place], [place] [address] [phone]

## 2019-07-06 NOTE — Telephone Encounter (Signed)
Submitted. PA armodafinil on CMM. Key: [id number]. [id number];Review Type:Prior Auth;Coverage Start Date:07/06/2019;Coverage End Date:07/05/2020;

## 2019-10-26 ENCOUNTER — Telehealth: Payer: Self-pay | Admitting: *Deleted

## 2019-10-26 NOTE — Telephone Encounter (Addendum)
Received PA request on CMM for dimethyl fumarate. [employer] - Rx #: [id number] Tried submitting and received the following response: "Drug is covered by current benefit plan. No further PA activity needed"

## 2020-01-06 ENCOUNTER — Encounter: Payer: Self-pay | Admitting: Neurology

## 2020-01-06 ENCOUNTER — Telehealth: Payer: Self-pay | Admitting: Neurology

## 2020-01-06 ENCOUNTER — Ambulatory Visit (INDEPENDENT_AMBULATORY_CARE_PROVIDER_SITE_OTHER): Payer: Managed Care, Other (non HMO) | Admitting: Neurology

## 2020-01-06 VITALS — BP 124/78 | HR 73 | Ht 66.0 in | Wt 153.5 lb

## 2020-01-06 DIAGNOSIS — F988 Other specified behavioral and emotional disorders with onset usually occurring in childhood and adolescence: Secondary | ICD-10-CM

## 2020-01-06 DIAGNOSIS — G35 Multiple sclerosis: Secondary | ICD-10-CM

## 2020-01-06 DIAGNOSIS — R269 Unspecified abnormalities of gait and mobility: Secondary | ICD-10-CM

## 2020-01-06 DIAGNOSIS — R339 Retention of urine, unspecified: Secondary | ICD-10-CM

## 2020-01-06 DIAGNOSIS — E559 Vitamin D deficiency, unspecified: Secondary | ICD-10-CM | POA: Diagnosis not present

## 2020-01-06 DIAGNOSIS — N319 Neuromuscular dysfunction of bladder, unspecified: Secondary | ICD-10-CM

## 2020-01-06 DIAGNOSIS — R5383 Other fatigue: Secondary | ICD-10-CM

## 2020-01-06 DIAGNOSIS — Z79899 Other long term (current) drug therapy: Secondary | ICD-10-CM

## 2020-01-06 MED ORDER — DIAZEPAM 5 MG PO TABS
ORAL_TABLET | ORAL | 1 refills | Status: DC
Start: 2020-01-06 — End: 2020-05-23

## 2020-01-06 NOTE — Telephone Encounter (Signed)
[employer] order sent to GI. They will obtain the auth and reach out to the patient to schedule.  

## 2020-01-06 NOTE — Progress Notes (Signed)
[facility]  PATIENT: Crystal Park DOB: 1960-05-25  REFERRING DOCTOR OR PCP:  Narda Bonds (ENT referred)   [name] (PCP) SOURCE: paitent and records from Dr. Ezzard Standing.    CT images on PACS  _________________________________   HISTORICAL  CHIEF COMPLAINT:  Chief Complaint  Patient presents with  . Follow-up    Rm 12 with husband. Last seen 07/06/2019. No falls since last seen. Reports left foot has bruise on top/swelling. Swelling is starting to resolve. Feels it is from her foot brace. Also hit left calf muscle on car a couple weeks ago. Has some bruising/swelling left over.  . Multiple Sclerosis    On dimethyl fumarate.   . Gait Problem    Takes ampyra. Ambulates with quad cane.     HISTORY OF PRESENT ILLNESS:  Crystal Park is a 59 y.o. woman with relapsing remitting multiple sclerosis.   Update 01/06/2020: She is on DMF and tolerates it well.   No recent CBC.  TSH in September was normal.  Her main problem is gait.   She does not think Ampyra has helped much.  We discussed a trial off Ampyra and then back on over a month to see if the medication is helping much.   If there is not a significant benefit she should stop.  Her main problem is her gait.    Both legs are weak but right needs to be lifted when she gets into the car while she can get left in by herself.  She feels hands are symmetric but there is mild reduced fine motor control (writing).   This is stable.   She denies any falls since the last visit.   Getting in and out of a car is difficult and she banged her leg hard the one time .   She needs to does self caths about every 4-5 hours.   Also on oxybutynin for spasms..   She has no UTI's recently.   Vision is fine and is 20/20 with correction.     She has physical > cognitive fatigue.  She takes naps every day.    She has a light snore but no OSA signs.   She also has difficulty with focus, attention and short term fatigue.   She  denies difficulty with word finding.   Adderall had not helped her and she noted constipation and headache and it did not help. .  She sleeps poorly (has to wake up to cath at 1 am and then wakes up at 5 am to take her thyroid medication.   She denies depression but has anxiety.      Since the last visit she has had her Covid vaccination.  She has not done the booster yet.  She had a uterine cyst removed.    She has done her flu shot recently.     MS History:   About 2010, she had an event with vertigo and diplopia that took 6 months to improve.  After that she began to have issues with her legs.   I have reviewed the MRI of the brain and the spinal cord.   The MRI of the spine shows several T2 weighted lesions, some peripherally located consistent with multiple sclerosis. None of the foci enhanced. The MRI of the brain shows multiple foci in the periventricular, juxtacortical cortical and deep white matter consistent with multiple sclerosis.   There is also a left cerebellar focus that could represent remote stroke (it is  also present on CT scan from 2010)  IMAGING MRI of the brain 01/23/2019 was unchanged.  MRI of the brain 02/15/2018 showed multiple T2/flair hyperintense foci in the hemispheres, thalamus, brainstem in a pattern and configuration consistent with chronic demyelinating plaque associated with multiple sclerosis.  None of the foci appears to be acute.  When compared to the MRI dated 04/21/2016, there is no definite interval change.   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   Normal enhancement pattern and no acute findings.  MRI of the cervical spine 04/24/2016 showed Multiple T2 hyperintense cervical cord lesions are stable in comparison with the prior cervical MRI new with the largest lesions at the C2, C4 and C7-T1 levels. No abnormal enhancement to suggest active demyelination.   No changes  compared to the 10/29/2015 cervical spine MRI.  MRI of the thoracic spine 04/21/2016 showed Increased confluence of abnormal thoracic spinal cord signal from the T3 through the T5 level (series 6, image 7). Cord involvement here various from the right hemicord at T3 - to the central and left hemi cord at T4 - to nearly holocord involvement at T5 (series 8, image 13). Progressed and near holo cord signal abnormality also at T7 (series 6, image 8). Similar progressed thoracic cord signal abnormality at T9 and T10 (series 6, images 9 and 10).  Other patchy thoracic spinal cord heterogeneity, including at T8-T9 and also just above the conus at T12-L1, appears stable. No thoracic cord expansion.    There was felt to be progression compared to the August 2017 MRI  REVIEW OF SYSTEMS: Constitutional: No fevers, chills, sweats, or change in appetite.   She notes fatigue Eyes: No visual changes, double vision, eye pain Ear, nose and throat: No hearing loss, ear pain, nasal congestion, sore throat.   Has vertigo Cardiovascular: No chest pain, palpitations Respiratory: No shortness of breath at rest or with exertion.   No wheezes GastrointestinaI: No nausea, vomiting, diarrhea, abdominal pain, fecal incontinence Genitourinary: See above. She has not had any recent UTI.Marland Kitchen Musculoskeletal: No neck pain, back pain Integumentary: No rash, pruritus, skin lesions Neurological: as above Psychiatric: No depression at this time.  Mild anxiety Endocrine: No palpitations, diaphoresis, change in appetite, change in weigh or increased thirst Hematologic/Lymphatic: No anemia, purpura, petechiae. Allergic/Immunologic: No itchy/runny eyes, nasal congestion, recent allergic reactions, rashes  ALLERGIES: Allergies  Allergen Reactions  . Adderall [Amphetamine-Dextroamphetamine] Other (See Comments)    constipation    HOME MEDICATIONS:  Current Outpatient Medications:  .  alendronate (FOSAMAX) 70 MG tablet, Take 70  mg by mouth once a week. Take with a full glass of water on an empty stomach., Disp: , Rfl:  .  cholecalciferol (VITAMIN D) 1000 UNITS tablet, Take 5,000 Units by mouth daily. otc vit. d 5,000iu daily/fim , Disp: , Rfl:  .  CRANBERRY PO, Take 1 capsule by mouth daily., Disp: , Rfl:  .  dalfampridine 10 MG TB12, Take 1 tablet (10 mg total) by mouth 2 (two) times daily., Disp: 60 tablet, Rfl: 11 .  diazepam (VALIUM) 5 MG tablet, Take up to three times a day po prn dizziness, Disp: 270 tablet, Rfl: 1 .  Dimethyl Fumarate (TECFIDERA) 240 MG CPDR, Take 1 capsule by mouth twice daily, Disp: 60 capsule, Rfl: 11 .  levothyroxine (SYNTHROID) 88 MCG tablet, levothyroxine 88 mcg tablet, Disp: , Rfl:  .  Multiple Vitamin (MULTIVITAMIN) tablet, Take 1 tablet by mouth  daily., Disp: , Rfl:  .  oxybutynin (DITROPAN) 5 MG tablet, 5 mg daily. , Disp: , Rfl:  No current facility-administered medications for this visit.  Facility-Administered Medications Ordered in Other Visits:  .  botulinum toxin Type A (BOTOX) injection 200 Units, 200 Units, Intramuscular, Once, Jerilee Field, MD .  gadopentetate dimeglumine (MAGNEVIST) injection 11 mL, 11 mL, Intravenous, Once PRN, Chanta Bauers A, MD .  gadopentetate dimeglumine (MAGNEVIST) injection 12 mL, 12 mL, Intravenous, Once PRN, Philmore Lepore, Pearletha Furl, MD  PAST MEDICAL HISTORY: Past Medical History:  Diagnosis Date  . Benign paroxysmal positional vertigo 10/12/2014  . Constipation   . Family history of breast cancer   . Fatigue   . Headache    history of migaines  . History of kidney stones   . History of MRSA infection   . History of normocytic normochromic anemia   . MS (multiple sclerosis) (HCC)    diagnosed 9/16  . PONV (postoperative nausea and vomiting)     PAST SURGICAL HISTORY: Past Surgical History:  Procedure Laterality Date  . BREAST LUMPECTOMY Right    benign  . COLONOSCOPY    . CYSTOSCOPY WITH INJECTION N/A 04/23/2017   Procedure: CYSTOSCOPY  WITH INJECTION/ BOTOX 200 UNITS;  Surgeon: Jerilee Field, MD;  Location: [facility];  Service: Urology;  Laterality: N/A;  . OOPHORECTOMY Left    2013  . TONSILLECTOMY AND ADENOIDECTOMY     age 61  . TRANSURETHRAL RESECTION OF BLADDER TUMOR N/A 04/23/2017   Procedure: TRANSURETHRAL RESECTION OF BLADDER TUMOR (TURBT);  Surgeon: Jerilee Field, MD;  Location: [facility];  Service: Urology;  Laterality: N/A;    FAMILY HISTORY: History reviewed. No pertinent family history.  SOCIAL HISTORY:  Social History   Socioeconomic History  . Marital status: Married    Spouse name: Not on file  . Number of children: Not on file  . Years of education: Not on file  . Highest education level: Not on file  Occupational History  . Not on file  Tobacco Use  . Smoking status: Never Smoker  . Smokeless tobacco: Never Used  Substance and Sexual Activity  . Alcohol use: Yes    Alcohol/week: 0.0 standard drinks    Comment: rare  . Drug use: No  . Sexual activity: Not Currently  Other Topics Concern  . Not on file  Social History Narrative  . Not on file   Social Determinants of Health   Financial Resource Strain:   . Difficulty of Paying Living Expenses: Not on file  Food Insecurity:   . Worried About Programme researcher, broadcasting/film/video in the Last Year: Not on file  . Ran Out of Food in the Last Year: Not on file  Transportation Needs:   . Lack of Transportation (Medical): Not on file  . Lack of Transportation (Non-Medical): Not on file  Physical Activity:   . Days of Exercise per Week: Not on file  . Minutes of Exercise per Session: Not on file  Stress:   . Feeling of Stress : Not on file  Social Connections:   . Frequency of Communication with Friends and Family: Not on file  . Frequency of Social Gatherings with Friends and Family: Not on file  . Attends Religious Services: Not on file  . Active Member of Clubs or Organizations: Not on file  . Attends Tax inspector Meetings: Not on file  . Marital Status: Not on file  Intimate Partner Violence:   .  Fear of Current or Ex-Partner: Not on file  . Emotionally Abused: Not on file  . Physically Abused: Not on file  . Sexually Abused: Not on file     PHYSICAL EXAM  Vitals:   01/06/20 0809  BP: 124/78  Pulse: 73  Weight: 153 lb 8 oz (69.6 kg)  Height: 5\' 6"  (1.676 m)    Body mass index is 24.78 kg/m.   General: The patient is well-developed and well-nourished and in no acute distress.   Hearing is symmetric.   Right > left ankle edema.  Neurologic Exam  Mental status: The patient is alert and oriented x 3 at the time of the examination. The patient has apparent normal recent and remote memory, with an apparently normal attention span and concentration ability.   Speech is normal.  Cranial nerves: Extraocular movements shows right nystagmus to right gaze with mildly reduced ability to abduct the right eye and milder nystagmus when she looks to the left.. There is mildly reduced color vision OS.  Facial strength and sensation is normal. Trapezius strength is strong.  No obvious hearing deficits are noted.  Motor:  Muscle bulk is normal.   Muscle tone is increased in the legs, right greater than left. Strength is  5 / 5 in the arms though slightly reduced left RAM  and 4/5 in left leg and 4-/5 in right leg and 3/5 EHL  Sensory: She has normal sensation to touch and vibration in arms but reduced right vibration sense in leg  Coordination: Cerebellar testing shows fairly good finger-nose-finger and reduced heel-to-shin, poor on right.  Gait and station: She is able to rise up from a chair using one arm for support and can stand without support.  She has a right greater than left foot drop and mild ataxia with her gait.  She cannot do a tandem walk.   Romberg is negative.   Reflexes: Deep tendon reflexes are symmetric and 3+ bilaterally  and increased in the knees (with spread, worse  right).  No ankle clonus..          ASSESSMENT AND PLAN  MS (multiple sclerosis) (HCC) - Plan: CBC with Differential/Platelet, Comprehensive metabolic panel  Urinary retention  High risk medication use - Plan: CBC with Differential/Platelet, Comprehensive metabolic panel  Multiple sclerosis (HCC) - Plan: MR BRAIN W WO CONTRAST  Vitamin D deficiency  Gait disturbance  Neurogenic bladder  Attention deficit disorder, unspecified hyperactivity presence  Other fatigue   1.   Continue Tecfidera.  We will check lab work.  Check MRI to determine if any subclinical activity and consider a different DMT if this is occurring.   2.    Trial off/on Ampyra for gait and d/c if not clearly better 'on'.  Renew Valium for vertigo and spasticity. 3.   Stay active and exercises tolerated.    4.    Continue vit D supplements.  5.  I will see her back 6 months or sooner if she has new or worsening symptoms.    45-minute office visit with the majority of the time spent face-to-face for history and physical, discussion/counseling and decision-making.  Additional time with record review and documentation.  Clarisse Rodriges A. Epimenio Foot, MD, PhD 01/06/2020, 9:07 AM Certified in Neurology, Clinical Neurophysiology, Sleep Medicine, Pain Medicine and Neuroimaging  [facility] [address] [place], [place] [address] [phone]

## 2020-01-07 ENCOUNTER — Other Ambulatory Visit: Payer: Self-pay | Admitting: *Deleted

## 2020-01-07 ENCOUNTER — Telehealth: Payer: Self-pay | Admitting: *Deleted

## 2020-01-07 DIAGNOSIS — R945 Abnormal results of liver function studies: Secondary | ICD-10-CM

## 2020-01-07 DIAGNOSIS — Z1329 Encounter for screening for other suspected endocrine disorder: Secondary | ICD-10-CM

## 2020-01-07 DIAGNOSIS — G35 Multiple sclerosis: Secondary | ICD-10-CM

## 2020-01-07 DIAGNOSIS — Z79899 Other long term (current) drug therapy: Secondary | ICD-10-CM

## 2020-01-07 LAB — CBC WITH DIFFERENTIAL/PLATELET
Basophils Absolute: 0.1 10*3/uL (ref 0.0–0.2)
Basos: 1 %
EOS (ABSOLUTE): 0.2 10*3/uL (ref 0.0–0.4)
Eos: 3 %
Hematocrit: 45.6 % (ref 34.0–46.6)
Hemoglobin: 15.2 g/dL (ref 11.1–15.9)
Immature Grans (Abs): 0 10*3/uL (ref 0.0–0.1)
Immature Granulocytes: 0 %
Lymphocytes Absolute: 1.8 10*3/uL (ref 0.7–3.1)
Lymphs: 27 %
MCH: 32 pg (ref 26.6–33.0)
MCHC: 33.3 g/dL (ref 31.5–35.7)
MCV: 96 fL (ref 79–97)
Monocytes Absolute: 0.7 10*3/uL (ref 0.1–0.9)
Monocytes: 11 %
Neutrophils Absolute: 3.9 10*3/uL (ref 1.4–7.0)
Neutrophils: 58 %
Platelets: 345 10*3/uL (ref 150–450)
RBC: 4.75 x10E6/uL (ref 3.77–5.28)
RDW: 12.8 % (ref 11.7–15.4)
WBC: 6.7 10*3/uL (ref 3.4–10.8)

## 2020-01-07 LAB — COMPREHENSIVE METABOLIC PANEL
ALT: 145 IU/L — ABNORMAL HIGH (ref 0–32)
AST: 36 IU/L (ref 0–40)
Albumin/Globulin Ratio: 1.9 (ref 1.2–2.2)
Albumin: 4.9 g/dL (ref 3.8–4.9)
Alkaline Phosphatase: 122 IU/L — ABNORMAL HIGH (ref 44–121)
BUN/Creatinine Ratio: 38 — ABNORMAL HIGH (ref 9–23)
BUN: 25 mg/dL — ABNORMAL HIGH (ref 6–24)
Bilirubin Total: 0.4 mg/dL (ref 0.0–1.2)
CO2: 23 mmol/L (ref 20–29)
Calcium: 9.8 mg/dL (ref 8.7–10.2)
Chloride: 102 mmol/L (ref 96–106)
Creatinine, Ser: 0.65 mg/dL (ref 0.57–1.00)
GFR calc Af Amer: 114 mL/min/{1.73_m2} (ref >59)
GFR calc non Af Amer: 99 mL/min/{1.73_m2} (ref >59)
Globulin, Total: 2.6 g/dL (ref 1.5–4.5)
Glucose: 77 mg/dL (ref 65–99)
Potassium: 3.9 mmol/L (ref 3.5–5.2)
Sodium: 141 mmol/L (ref 134–144)
Total Protein: 7.5 g/dL (ref 6.0–8.5)

## 2020-01-07 NOTE — Telephone Encounter (Signed)
-----   Message from Asa Lente, MD sent at 01/07/2020 12:33 PM EDT ----- Can you please let her know and order:   The one liver test was elevated (ALT) but other liver tests were fine.  Since it was moderately elevated, I would like to have you repeat the liver function test in about 6 weeks.  I'll also check some other blood work at that time (creatinine kinase and TSH)

## 2020-01-07 NOTE — Telephone Encounter (Signed)
Called and spoke with pt about lab results per Dr. Epimenio Foot note. She verbalized understanding. I placed future lab orders.Scheduled next lab draw appt for 02/15/20 at 8am with pt.

## 2020-01-13 ENCOUNTER — Other Ambulatory Visit: Payer: Self-pay

## 2020-01-13 ENCOUNTER — Ambulatory Visit
Admission: RE | Admit: 2020-01-13 | Discharge: 2020-01-13 | Disposition: A | Discharge status: Home / Self Care | Payer: Managed Care, Other (non HMO) | Source: Ambulatory Visit | Attending: Neurology | Admitting: Neurology

## 2020-01-13 DIAGNOSIS — G35 Multiple sclerosis: Secondary | ICD-10-CM

## 2020-01-13 MED ORDER — GADOBENATE DIMEGLUMINE 529 MG/ML IV SOLN
14.0000 mL | Freq: Once | INTRAVENOUS | Status: AC | PRN
  Administered 2020-01-13: 14 mL via INTRAVENOUS

## 2020-02-15 ENCOUNTER — Encounter: Payer: Self-pay | Admitting: *Deleted

## 2020-02-15 ENCOUNTER — Other Ambulatory Visit (INDEPENDENT_AMBULATORY_CARE_PROVIDER_SITE_OTHER): Payer: Self-pay

## 2020-02-15 ENCOUNTER — Telehealth: Payer: Self-pay | Admitting: *Deleted

## 2020-02-15 DIAGNOSIS — Z0289 Encounter for other administrative examinations: Secondary | ICD-10-CM

## 2020-02-15 DIAGNOSIS — Z1329 Encounter for screening for other suspected endocrine disorder: Secondary | ICD-10-CM

## 2020-02-15 DIAGNOSIS — G35 Multiple sclerosis: Secondary | ICD-10-CM

## 2020-02-15 DIAGNOSIS — Z79899 Other long term (current) drug therapy: Secondary | ICD-10-CM

## 2020-02-15 DIAGNOSIS — R945 Abnormal results of liver function studies: Secondary | ICD-10-CM

## 2020-02-15 NOTE — Telephone Encounter (Signed)
Called and LVM for pt to call office to discuss Attending physician statement from [facility]. Wanting to discuss driving ability.

## 2020-02-16 ENCOUNTER — Other Ambulatory Visit: Payer: Self-pay | Admitting: Neurology

## 2020-02-16 DIAGNOSIS — R748 Abnormal levels of other serum enzymes: Secondary | ICD-10-CM

## 2020-02-16 LAB — HEPATIC FUNCTION PANEL
ALT: 127 IU/L — ABNORMAL HIGH (ref 0–32)
AST: 66 IU/L — ABNORMAL HIGH (ref 0–40)
Albumin: 4.3 g/dL (ref 3.8–4.9)
Alkaline Phosphatase: 80 IU/L (ref 44–121)
Bilirubin Total: 0.3 mg/dL (ref 0.0–1.2)
Bilirubin, Direct: 0.14 mg/dL (ref 0.00–0.40)
Total Protein: 6.4 g/dL (ref 6.0–8.5)

## 2020-02-16 LAB — TSH: TSH: 1.8 u[IU]/mL (ref 0.450–4.500)

## 2020-02-16 LAB — CK: Total CK: 24 U/L — ABNORMAL LOW (ref 32–182)

## 2020-02-16 NOTE — Telephone Encounter (Signed)
LVM for pt to call office back.

## 2020-02-17 ENCOUNTER — Other Ambulatory Visit: Payer: Self-pay | Admitting: Neurology

## 2020-02-17 DIAGNOSIS — G35 Multiple sclerosis: Secondary | ICD-10-CM

## 2020-02-17 NOTE — Telephone Encounter (Signed)
Took call from phone staff and spoke with pt. She reports that she never drives. Advised we will process form for her. She verbalized understanding and appreciation.

## 2020-02-17 NOTE — Telephone Encounter (Signed)
Faxed completed/signed form to the [facility] at [phone]. Received fax confirmation. Sent to MR to be scanned.

## 2020-05-23 ENCOUNTER — Other Ambulatory Visit: Payer: Self-pay | Admitting: *Deleted

## 2020-05-23 MED ORDER — DIAZEPAM 5 MG PO TABS: ORAL_TABLET | ORAL | 1 refills | Status: DC

## 2020-05-25 ENCOUNTER — Other Ambulatory Visit (INDEPENDENT_AMBULATORY_CARE_PROVIDER_SITE_OTHER): Payer: Self-pay

## 2020-05-25 DIAGNOSIS — R748 Abnormal levels of other serum enzymes: Secondary | ICD-10-CM

## 2020-05-25 DIAGNOSIS — Z0289 Encounter for other administrative examinations: Secondary | ICD-10-CM

## 2020-05-26 LAB — HEPATIC FUNCTION PANEL
ALT: 32 IU/L (ref 0–32)
AST: 26 IU/L (ref 0–40)
Albumin: 4.5 g/dL (ref 3.8–4.9)
Alkaline Phosphatase: 86 IU/L (ref 44–121)
Bilirubin Total: 0.5 mg/dL (ref 0.0–1.2)
Bilirubin, Direct: 0.12 mg/dL (ref 0.00–0.40)
Total Protein: 6.7 g/dL (ref 6.0–8.5)

## 2020-05-26 LAB — HEPATITIS B CORE ANTIBODY, TOTAL: Hep B Core Total Ab: NEGATIVE

## 2020-05-26 LAB — HEPATITIS B SURFACE ANTIBODY,QUALITATIVE: Hep B Surface Ab, Qual: NONREACTIVE

## 2020-05-26 LAB — HEPATITIS B SURFACE ANTIGEN: Hepatitis B Surface Ag: NEGATIVE

## 2020-05-26 LAB — HEPATITIS C ANTIBODY: Hep C Virus Ab: <0.1 s/co ratio (ref 0.0–0.9)

## 2020-06-21 ENCOUNTER — Encounter: Payer: Self-pay | Admitting: *Deleted

## 2020-07-06 ENCOUNTER — Encounter: Payer: Self-pay | Admitting: Neurology

## 2020-07-06 ENCOUNTER — Ambulatory Visit (INDEPENDENT_AMBULATORY_CARE_PROVIDER_SITE_OTHER): Payer: Managed Care, Other (non HMO) | Admitting: Neurology

## 2020-07-06 VITALS — BP 112/78 | HR 86 | Ht 66.0 in | Wt 142.0 lb

## 2020-07-06 DIAGNOSIS — G35 Multiple sclerosis: Secondary | ICD-10-CM

## 2020-07-06 DIAGNOSIS — G4710 Hypersomnia, unspecified: Secondary | ICD-10-CM

## 2020-07-06 DIAGNOSIS — R269 Unspecified abnormalities of gait and mobility: Secondary | ICD-10-CM

## 2020-07-06 DIAGNOSIS — R339 Retention of urine, unspecified: Secondary | ICD-10-CM | POA: Diagnosis not present

## 2020-07-06 DIAGNOSIS — R5383 Other fatigue: Secondary | ICD-10-CM

## 2020-07-06 DIAGNOSIS — Z79899 Other long term (current) drug therapy: Secondary | ICD-10-CM

## 2020-07-06 NOTE — Progress Notes (Signed)
[facility]  PATIENT: Crystal Park DOB: 07/27/1960  REFERRING DOCTOR OR PCP:  Narda Bonds (ENT referred)   [name] (PCP) SOURCE: paitent and records from Dr. Ezzard Standing.    CT images on PACS  _________________________________   HISTORICAL  CHIEF COMPLAINT:  Chief Complaint  Patient presents with  . Follow-up    RM 13. Last seen 01/06/2020. On dimethyl fumarate for MS. Ambulates w/ cane. No new sx. 1 fall since last seen. Bruised up right arm/abdomen. She fell in the bathroom.     HISTORY OF PRESENT ILLNESS:  Crystal Park is a 60 y.o. woman with relapsing remitting multiple sclerosis.   Update 07/06/2020: She is on DMF and tolerates it well.   She did no thave GI issues when she started.   She is eating less.  She has reduced appetite and gets stomach aches when she eats.    Neurologically she is doing about the same.   She needs to use the cane.   Ampyra did not help.     Both legs are weak, right > left and she needs to lift the right leg with arms when she gets into the car .  Arms are strong.   Handwriting is unchanged     She needs to does self caths about every 4-5 hours.   Also on oxybutynin for spasms..   She has no UTI's recently.   Vision is fine and is 20/20 with correction.     She has physical > cognitive fatigue.  She takes naps every day (more than last year).    She has a light snore but no OSA signs.   She also has difficulty with focus, attention and short term fatigue.   She denies difficulty with word finding.   Adderall, armodafinil and amantadine had not helped her.  She sleeps ok (has to wake up to cath 3-4 am and then wakes up at  am to take her morning medication but can fall back asleep and sleeps a few more hours).  Overall 13-14 hours a day/sleep. .She used to work and stay awake from 5am to 8 pm (9 hours of sleep).   She denies depression but has anxiety.    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    Since the last visit  she has had her Covid vaccination and the booster.    MS History:   About 2010, she had an event with vertigo and diplopia that took 6 months to improve.  After that she began to have issues with her legs.   I have reviewed the MRI of the brain and the spinal cord.   The MRI of the spine shows several T2 weighted lesions, some peripherally located consistent with multiple sclerosis. None of the foci enhanced. The MRI of the brain shows multiple foci in the periventricular, juxtacortical cortical and deep white matter consistent with multiple sclerosis.   There is also a left cerebellar focus that could represent remote stroke (it is also present on CT scan from 2010)  IMAGING MRI brain 07/06/2020 showed Multiple T2/FLAIR hyperintense foci in the hemispheres and brainstem in a pattern and configuration consistent with chronic demyelinating plaque associated with multiple sclerosis.  None of the foci appear to be acute and they do not enhance.  Compared to the MRI from 01/23/2019, there are no new lesions. 2.    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 MRI of the brain 01/23/2019 was unchanged.  MRI of the brain 02/15/2018 showed multiple T2/flair hyperintense foci in the hemispheres, thalamus, brainstem in a pattern and configuration consistent with chronic demyelinating plaque associated with multiple sclerosis.  None of the foci appears to be acute.  When compared to the MRI dated 04/21/2016, there is no definite interval change.   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   Normal enhancement pattern and no acute findings.  MRI of the cervical spine 04/24/2016 showed Multiple T2 hyperintense cervical cord lesions are stable in comparison with the prior cervical MRI new with the largest lesions at the C2, C4 and C7-T1 levels.  No abnormal enhancement to suggest active demyelination.   No changes compared to the 10/29/2015 cervical spine MRI.  MRI of the thoracic spine 04/21/2016 showed Increased confluence of abnormal thoracic spinal cord signal from the T3 through the T5 level (series 6, image 7). Cord involvement here various from the right hemicord at T3 - to the central and left hemi cord at T4 - to nearly holocord involvement at T5 (series 8, image 13). Progressed and near holo cord signal abnormality also at T7 (series 6, image 8). Similar progressed thoracic cord signal abnormality at T9 and T10 (series 6, images 9 and 10).  Other patchy thoracic spinal cord heterogeneity, including at T8-T9 and also just above the conus at T12-L1, appears stable. No thoracic cord expansion.    There was felt to be progression compared to the August 2017 MRI  REVIEW OF SYSTEMS: Constitutional: No fevers, chills, sweats, or change in appetite.   She notes fatigue Eyes: No visual changes, double vision, eye pain Ear, nose and throat: No hearing loss, ear pain, nasal congestion, sore throat.   Has vertigo Cardiovascular: No chest pain, palpitations Respiratory: No shortness of breath at rest or with exertion.   No wheezes GastrointestinaI: No nausea, vomiting, diarrhea, abdominal pain, fecal incontinence Genitourinary: See above. She has not had any recent UTI.Marland Kitchen. Musculoskeletal: No neck pain, back pain Integumentary: No rash, pruritus, skin lesions Neurological: as above Psychiatric: No depression at this time.  Mild anxiety Endocrine: No palpitations, diaphoresis, change in appetite, change in weigh or increased thirst Hematologic/Lymphatic: No anemia, purpura, petechiae. Allergic/Immunologic: No itchy/runny eyes, nasal congestion, recent allergic reactions, rashes  ALLERGIES: Allergies  Allergen Reactions  . Adderall [Amphetamine-Dextroamphetamine] Other (See Comments)    constipation    HOME MEDICATIONS:  Current  Outpatient Medications:  .  alendronate (FOSAMAX) 70 MG tablet, Take 70 mg by mouth once a week. Take with a full glass of water on an empty stomach., Disp: , Rfl:  .  cholecalciferol (VITAMIN D) 1000 UNITS tablet, Take 5,000 Units by mouth daily. otc vit. d 5,000iu daily/fim, Disp: , Rfl:  .  CRANBERRY PO, Take 1 capsule by mouth daily., Disp: , Rfl:  .  diazepam (VALIUM) 5 MG tablet, Take up to three times a day po prn dizziness, Disp: 270 tablet, Rfl: 1 .  Dimethyl Fumarate 240 MG CPDR, TAKE 1 CAPSULE TWICE A DAY, Disp: 60 capsule, Rfl: 11 .  levothyroxine (SYNTHROID) 88 MCG tablet, levothyroxine 88 mcg tablet, Disp: , Rfl:  .  Multiple Vitamin (MULTIVITAMIN) tablet, Take 1 tablet by mouth daily., Disp: , Rfl:  .  oxybutynin (DITROPAN) 5 MG tablet, 5 mg daily. , Disp: , Rfl:  No current facility-administered medications for this visit.  Facility-Administered Medications Ordered in Other Visits:  .  botulinum toxin Type A (BOTOX) injection 200 Units, 200 Units, Intramuscular, Once, Jerilee Field, MD .  gadopentetate dimeglumine (MAGNEVIST) injection 11 mL, 11 mL, Intravenous, Once PRN, Pauline Pegues A, MD .  gadopentetate dimeglumine (MAGNEVIST) injection 12 mL, 12 mL, Intravenous, Once PRN, Gladiola Madore, Pearletha Furl, MD  PAST MEDICAL HISTORY: Past Medical History:  Diagnosis Date  . Benign paroxysmal positional vertigo 10/12/2014  . Constipation   . Family history of breast cancer   . Fatigue   . Headache    history of migaines  . History of kidney stones   . History of MRSA infection   . History of normocytic normochromic anemia   . MS (multiple sclerosis) (HCC)    diagnosed 9/16  . PONV (postoperative nausea and vomiting)     PAST SURGICAL HISTORY: Past Surgical History:  Procedure Laterality Date  . BREAST LUMPECTOMY Right    benign  . COLONOSCOPY    . CYSTOSCOPY WITH INJECTION N/A 04/23/2017   Procedure: CYSTOSCOPY WITH INJECTION/ BOTOX 200 UNITS;  Surgeon: Jerilee Field,  MD;  Location: [facility];  Service: Urology;  Laterality: N/A;  . OOPHORECTOMY Left    2013  . TONSILLECTOMY AND ADENOIDECTOMY     age 69  . TRANSURETHRAL RESECTION OF BLADDER TUMOR N/A 04/23/2017   Procedure: TRANSURETHRAL RESECTION OF BLADDER TUMOR (TURBT);  Surgeon: Jerilee Field, MD;  Location: [facility];  Service: Urology;  Laterality: N/A;    FAMILY HISTORY: History reviewed. No pertinent family history.  SOCIAL HISTORY:  Social History   Socioeconomic History  . Marital status: Married    Spouse name: Not on file  . Number of children: Not on file  . Years of education: Not on file  . Highest education level: Not on file  Occupational History  . Not on file  Tobacco Use  . Smoking status: Never Smoker  . Smokeless tobacco: Never Used  Substance and Sexual Activity  . Alcohol use: Yes    Alcohol/week: 0.0 standard drinks    Comment: rare  . Drug use: No  . Sexual activity: Not Currently  Other Topics Concern  . Not on file  Social History Narrative  . Not on file   Social Determinants of Health   Financial Resource Strain: Not on file  Food Insecurity: Not on file  Transportation Needs: Not on file  Physical Activity: Not on file  Stress: Not on file  Social Connections: Not on file  Intimate Partner Violence: Not on file     PHYSICAL EXAM  Vitals:   07/06/20 1123  BP: 112/78  Pulse: 86  Weight: 142 lb (64.4 kg)  Height: 5\' 6"  (1.676 m)    Body mass index is 22.92 kg/m.   General: The patient is well-developed and well-nourished and in no acute distress.   Hearing is symmetric.  Mild right ankle edema.  Neurologic Exam  Mental status: The patient is alert and oriented x 3 at the time of the examination. The patient has apparent normal recent and remote memory, with an apparently normal attention span and concentration ability.   Speech is normal.  Cranial nerves: Extraocular movements shows right nystagmus  to right gaze with mildly reduced ability to abduct the right eye and milder nystagmus when she looks to the left.. There is mildly reduced color vision OS.  Facial strength and sensation is normal. Trapezius strength is strong   Motor:  Muscle bulk is normal.   Muscle tone is increased in the legs,  right greater than left. Strength is  5 / 5 in the arms though slightly reduced left RAM  and 4/5 in left leg and 4-/5 in right leg and 3/5 EHL  Sensory: She has normal sensation to touch and vibration in arms but reduced right vibration sense in leg  Coordination: Cerebellar testing shows fairly good finger-nose-finger and reduced heel-to-shin, poor on right.  Gait and station: She is able to rise up from a chair using one arm for support and can stand without support.  She has a right greater than left foot drop and mild ataxia with her gait.  Unable to do tandem walk.  Romberg negative.  Reflexes: Deep tendon reflexes are symmetric and 3+ bilaterally  and increased in the knees (with spread, worse right).  No ankle clonus..          ASSESSMENT AND PLAN  MS (multiple sclerosis) (HCC) - Plan: Hepatic function panel, CBC with Differential/Platelet  Gait disturbance  Urinary retention  Other fatigue  Hypersomnia  High risk medication use - Plan: Hepatic function panel, CBC with Differential/Platelet   1.   Continue Tecfidera.  We will check lab work.   2.   Will remain off Ampyra as it was not helpful.  Fatigue has not responded to med's.   3.   Stay active and exercises tolerated.    4.    Continue vit D supplements.  5.  I will see her back 6 months or sooner if she has new or worsening symptoms.    Patric Vanpelt A. Epimenio Foot, MD, PhD 07/06/2020, 4:57 PM Certified in Neurology, Clinical Neurophysiology, Sleep Medicine, Pain Medicine and Neuroimaging  [facility] [address] [place], [place] [address] [phone]

## 2020-07-07 LAB — CBC WITH DIFFERENTIAL/PLATELET
Basophils Absolute: 0.1 10*3/uL (ref 0.0–0.2)
Basos: 1 %
EOS (ABSOLUTE): 0.2 10*3/uL (ref 0.0–0.4)
Eos: 3 %
Hematocrit: 43.9 % (ref 34.0–46.6)
Hemoglobin: 14.5 g/dL (ref 11.1–15.9)
Immature Grans (Abs): 0 10*3/uL (ref 0.0–0.1)
Immature Granulocytes: 0 %
Lymphocytes Absolute: 1.5 10*3/uL (ref 0.7–3.1)
Lymphs: 21 %
MCH: 31 pg (ref 26.6–33.0)
MCHC: 33 g/dL (ref 31.5–35.7)
MCV: 94 fL (ref 79–97)
Monocytes Absolute: 0.8 10*3/uL (ref 0.1–0.9)
Monocytes: 11 %
Neutrophils Absolute: 4.5 10*3/uL (ref 1.4–7.0)
Neutrophils: 64 %
Platelets: 347 10*3/uL (ref 150–450)
RBC: 4.67 x10E6/uL (ref 3.77–5.28)
RDW: 13 % (ref 11.7–15.4)
WBC: 7 10*3/uL (ref 3.4–10.8)

## 2020-07-07 LAB — HEPATIC FUNCTION PANEL
ALT: 25 IU/L (ref 0–32)
AST: 13 IU/L (ref 0–40)
Albumin: 4.4 g/dL (ref 3.8–4.9)
Alkaline Phosphatase: 84 IU/L (ref 44–121)
Bilirubin Total: 0.3 mg/dL (ref 0.0–1.2)
Bilirubin, Direct: <0.1 mg/dL (ref 0.00–0.40)
Total Protein: 7 g/dL (ref 6.0–8.5)

## 2021-01-12 ENCOUNTER — Ambulatory Visit (INDEPENDENT_AMBULATORY_CARE_PROVIDER_SITE_OTHER): Payer: Managed Care, Other (non HMO) | Admitting: Neurology

## 2021-01-12 ENCOUNTER — Encounter: Payer: Self-pay | Admitting: Neurology

## 2021-01-12 VITALS — BP 120/72 | HR 79 | Ht 66.0 in | Wt 136.5 lb

## 2021-01-12 DIAGNOSIS — R269 Unspecified abnormalities of gait and mobility: Secondary | ICD-10-CM

## 2021-01-12 DIAGNOSIS — E559 Vitamin D deficiency, unspecified: Secondary | ICD-10-CM

## 2021-01-12 DIAGNOSIS — G35 Multiple sclerosis: Secondary | ICD-10-CM

## 2021-01-12 DIAGNOSIS — R339 Retention of urine, unspecified: Secondary | ICD-10-CM | POA: Diagnosis not present

## 2021-01-12 DIAGNOSIS — R5383 Other fatigue: Secondary | ICD-10-CM

## 2021-01-12 DIAGNOSIS — Z79899 Other long term (current) drug therapy: Secondary | ICD-10-CM

## 2021-01-12 DIAGNOSIS — G4710 Hypersomnia, unspecified: Secondary | ICD-10-CM | POA: Diagnosis not present

## 2021-01-12 NOTE — Progress Notes (Signed)
[facility]  PATIENT: Crystal Park DOB: October 30, 1960  REFERRING DOCTOR OR PCP:  Crystal Park (ENT referred)   Crystal Park (PCP) SOURCE: paitent and records from Dr. Ezzard Park.    CT images on PACS  _________________________________   HISTORICAL  CHIEF COMPLAINT:  Chief Complaint  Patient presents with   Follow-up    Rm 1, w husband. Here for 6 month MS f/u, on dimethyl fumarate. Pt reports worsening in fatigue and walking. Pt ambulates w/ WC. Pt reports getting tired faster when walking long distance.     HISTORY OF PRESENT ILLNESS:  Crystal Park is a 60 y.o. woman with relapsing remitting multiple sclerosis.   Update 01/12/2021: She is on DMF and tolerates it well.   She did no thave GI issues when she started.   Appetite is ok now.     Neurologically she is doing a little worse.   With a cane, she can do only 13 steps.    Ampyra did not help.     Both legs are weak, right > left and she needs to lift the right leg with arms when she gets into the car .  Arms are strong.   However, handwriting has slowly worsened but other activities the same.      She has urinary retention and she needs to does self caths about every 4-5 hours.   Also on oxybutynin for spasms..   She has no UTI's recently.     Vision is fine.     She reports fatigue that is physical > cognitive.  She takes naps every day (more than last year).    She has a light snore but no OSA signs.     She has tried bed yoga but does not do more physical exercises.      She also has difficulty with focus, attention and short term fatigue.   She denies difficulty with word finding.   Adderall, armodafinil and amantadine had not helped her.  She sleeps ok (has to wake up to cath 3-4 am and then wakes up at  am to take her morning medication but can fall back asleep and sleeps a few more hours).  Overall 12-14 hours a day/sleep.   She denies depression but has anxiety.   A friend passed away.       MS History:   About 2010, she had an event with vertigo and diplopia that took 6 months to improve.  After that she began to have issues with her legs.   I have reviewed the MRI of the brain and the spinal cord.   The MRI of the spine shows several T2 weighted lesions, some peripherally located consistent with multiple sclerosis. None of the foci enhanced. The MRI of the brain shows multiple foci in the periventricular, juxtacortical cortical and deep white matter consistent with multiple sclerosis.   There is also a left cerebellar focus that could represent remote stroke (it is also present on CT scan from 2010)  IMAGING MRI brain 07/06/2020 showed Multiple T2/FLAIR hyperintense foci in the hemispheres and brainstem in a pattern and configuration consistent with chronic demyelinating plaque associated with multiple sclerosis.  None of the foci appear to be acute and they do not enhance.  Compared to the MRI from 01/23/2019, there are no new lesions.        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 MRI of the  brain 01/23/2019 was unchanged.      MRI Brain 01/13/2020 unchanged compared to 01/23/2019  MRI of the brain 02/15/2018 showed multiple T2/flair hyperintense foci in the hemispheres, thalamus, brainstem in a pattern and configuration consistent with chronic demyelinating plaque associated with multiple sclerosis.  None of the foci appears to be acute.  When compared to the MRI dated 04/21/2016, there is no definite interval change.   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   Normal enhancement pattern and no acute findings.  MRI of the cervical spine 04/24/2016 showed Multiple T2 hyperintense cervical cord lesions are stable in comparison with the prior cervical MRI new with the largest lesions at the C2, C4 and C7-T1  levels. No abnormal enhancement to suggest active demyelination.   No changes compared to the 10/29/2015 cervical spine MRI.  MRI of the thoracic spine 04/21/2016 showed Increased confluence of abnormal thoracic spinal cord signal from the T3 through the T5 level (series 6, image 7). Cord involvement here various from the right hemicord at T3 - to the central and left hemi cord at T4 - to nearly holocord involvement at T5 (series 8, image 13). Progressed and near holo cord signal abnormality also at T7 (series 6, image 8). Similar progressed thoracic cord signal abnormality at T9 and T10 (series 6, images 9 and 10).  Other patchy thoracic spinal cord heterogeneity, including at T8-T9 and also just above the conus at T12-L1, appears stable. No thoracic cord expansion.    There was felt to be progression compared to the August 2017 MRI  REVIEW OF SYSTEMS: Constitutional: No fevers, chills, sweats, or change in appetite.   She notes fatigue Eyes: No visual changes, double vision, eye pain Ear, nose and throat: No hearing loss, ear pain, nasal congestion, sore throat.   Has vertigo Cardiovascular: No chest pain, palpitations Respiratory:  No shortness of breath at rest or with exertion.   No wheezes GastrointestinaI: No nausea, vomiting, diarrhea, abdominal pain, fecal incontinence Genitourinary:  See above. She has not had any recent UTI.Marland Kitchen Musculoskeletal:  No neck pain, back pain Integumentary: No rash, pruritus, skin lesions Neurological: as above Psychiatric: No depression at this time.  Mild anxiety Endocrine: No palpitations, diaphoresis, change in appetite, change in weigh or increased thirst Hematologic/Lymphatic:  No anemia, purpura, petechiae. Allergic/Immunologic: No itchy/runny eyes, nasal congestion, recent allergic reactions, rashes  ALLERGIES: Allergies  Allergen Reactions   Adderall [Amphetamine-Dextroamphetamine] Other (See Comments)    constipation    HOME  MEDICATIONS:  Current Outpatient Medications:    alendronate (FOSAMAX) 70 MG tablet, Take 70 mg by mouth once a week. Take with a full glass of water on an empty stomach., Disp: , Rfl:    CRANBERRY PO, Take 1 capsule by mouth daily., Disp: , Rfl:    diazepam (VALIUM) 5 MG tablet, Take up to three times a day po prn dizziness, Disp: 270 tablet, Rfl: 1   Dimethyl Fumarate 240 MG CPDR, TAKE 1 CAPSULE TWICE A DAY, Disp: 60 capsule, Rfl: 11   levothyroxine (SYNTHROID) 88 MCG tablet, levothyroxine 88 mcg tablet, Disp: , Rfl:    Multiple Vitamin (MULTIVITAMIN) tablet, Take 1 tablet by mouth daily., Disp: , Rfl:    oxybutynin (DITROPAN) 5 MG tablet, 5 mg daily. , Disp: , Rfl:  No current facility-administered medications for this visit.  Facility-Administered Medications Ordered in Other Visits:    botulinum toxin Type A (BOTOX) injection 200 Units,  200 Units, Intramuscular, Once, Jerilee Field, MD   gadopentetate dimeglumine (MAGNEVIST) injection 11 mL, 11 mL, Intravenous, Once PRN, Kana Reimann, Pearletha Furl, MD   gadopentetate dimeglumine (MAGNEVIST) injection 12 mL, 12 mL, Intravenous, Once PRN, Temesha Queener, Pearletha Furl, MD  PAST MEDICAL HISTORY: Past Medical History:  Diagnosis Date   Benign paroxysmal positional vertigo 10/12/2014   Constipation    Family history of breast cancer    Fatigue    Headache    history of migaines   History of kidney stones    History of MRSA infection    History of normocytic normochromic anemia    MS (multiple sclerosis) (HCC)    diagnosed 9/16   PONV (postoperative nausea and vomiting)     PAST SURGICAL HISTORY: Past Surgical History:  Procedure Laterality Date   BREAST LUMPECTOMY Right    benign   COLONOSCOPY     CYSTOSCOPY WITH INJECTION N/A 04/23/2017   Procedure: CYSTOSCOPY WITH INJECTION/ BOTOX 200 UNITS;  Surgeon: Jerilee Field, MD;  Location: [facility];  Service: Urology;  Laterality: N/A;   OOPHORECTOMY Left    2013   TONSILLECTOMY  AND ADENOIDECTOMY     age 38   TRANSURETHRAL RESECTION OF BLADDER TUMOR N/A 04/23/2017   Procedure: TRANSURETHRAL RESECTION OF BLADDER TUMOR (TURBT);  Surgeon: Jerilee Field, MD;  Location: [facility];  Service: Urology;  Laterality: N/A;    FAMILY HISTORY: History reviewed. No pertinent family history.  SOCIAL HISTORY:  Social History   Socioeconomic History   Marital status: Married    Spouse name: Not on file   Number of children: Not on file   Years of education: Not on file   Highest education level: Not on file  Occupational History   Not on file  Tobacco Use   Smoking status: Never   Smokeless tobacco: Never  Substance and Sexual Activity   Alcohol use: Yes    Alcohol/week: 0.0 standard drinks    Comment: rare   Drug use: No   Sexual activity: Not Currently  Other Topics Concern   Not on file  Social History Narrative   Not on file   Social Determinants of Health   Financial Resource Strain: Not on file  Food Insecurity: Not on file  Transportation Needs: Not on file  Physical Activity: Not on file  Stress: Not on file  Social Connections: Not on file  Intimate Partner Violence: Not on file     PHYSICAL EXAM  Vitals:   01/12/21 0816  BP: 120/72  Pulse: 79  Weight: 136 lb 8 oz (61.9 kg)  Height: 5\' 6"  (1.676 m)    Body mass index is 22.03 kg/m.   General: The patient is well-developed and well-nourished and in no acute distress.   Hearing is symmetric.  Mild right ankle edema.  Neurologic Exam  Mental status: The patient is alert and oriented x 3 at the time of the examination. The patient has apparent normal recent and remote memory, with an apparently normal attention span and concentration ability.   Speech is normal.  Cranial nerves: Extraocular movements shows right nystagmus to right gaze with mildly reduced ability to abduct the right eye and milder nystagmus when she looks to the left.. There is mildly reduced color  vision OS.  Facial strength and sensation is normal. Trapezius strength is strong   Motor:  Muscle bulk is normal.   Increased muscle tone in the legs, right greater than left.  Strength was 5/5 in  the arms.  However, rapid alternating movements were mildly reduced on the left hand.  Strength was 4/5 in left leg and 3/5 in the right iliopsoas and EHL and 4-/5 elsewhere in the right leg  Sensory: She has normal sensation to touch and vibration in arms but reduced right vibration sense in leg  Coordination: Cerebellar testing shows fairly good finger-nose-finger and reduced heel-to-shin, poor on right.  Gait and station: She is able to rise up from a chair using one arm for support and can stand without support.  She has a right greater than left foot drop and mild ataxia with her gait.  Unable to do tandem walk.  Romberg negative.  Reflexes: Deep tendon reflexes are symmetric and 3+ bilaterally  and increased in the knees (with spread, worse right).  No ankle clonus..          ASSESSMENT AND PLAN  MS (multiple sclerosis) (HCC) - Plan: Ambulatory referral to Physical Therapy  Gait disturbance - Plan: Ambulatory referral to Physical Therapy  Urinary retention  Hypersomnia  High risk medication use  Other fatigue  Vitamin D deficiency   1.   Continue Tecfidera.  Recent labs with [facility] showed lymphocytes = 1.4   2.   Will remain off Ampyra as it was not helpful.  Fatigue has not responded to med's.   3.   Stay active and exercises tolerated.    4.    PT for gait and help develop program to be more active at home.   I will see her back 6 months or sooner if she has new or worsening symptoms.    40-minute office visit with the majority of the time spent face-to-face for history and physical, discussion/counseling and decision-making.  Additional time with record review and documentation.  Geralda Baumgardner A. Epimenio Foot, MD, PhD 01/12/2021, 4:28 PM Certified in Neurology, Clinical  Neurophysiology, Sleep Medicine, Pain Medicine and Neuroimaging  [facility] [address], [address] [place], [place] [address] [phone]

## 2021-01-18 ENCOUNTER — Ambulatory Visit: Payer: Medicare Other | Admitting: Neurology

## 2021-01-22 ENCOUNTER — Other Ambulatory Visit: Payer: Self-pay | Admitting: Neurology

## 2021-01-22 DIAGNOSIS — G35 Multiple sclerosis: Secondary | ICD-10-CM

## 2021-01-31 ENCOUNTER — Telehealth: Payer: Self-pay | Admitting: Physical Therapy

## 2021-01-31 ENCOUNTER — Encounter: Payer: Self-pay | Admitting: Physical Therapy

## 2021-01-31 ENCOUNTER — Other Ambulatory Visit: Payer: Self-pay | Admitting: Neurology

## 2021-01-31 ENCOUNTER — Other Ambulatory Visit: Payer: Self-pay

## 2021-01-31 ENCOUNTER — Ambulatory Visit: Payer: Managed Care, Other (non HMO) | Attending: Neurology | Admitting: Physical Therapy

## 2021-01-31 DIAGNOSIS — R29818 Other symptoms and signs involving the nervous system: Secondary | ICD-10-CM | POA: Insufficient documentation

## 2021-01-31 DIAGNOSIS — G35 Multiple sclerosis: Secondary | ICD-10-CM | POA: Diagnosis not present

## 2021-01-31 DIAGNOSIS — R2689 Other abnormalities of gait and mobility: Secondary | ICD-10-CM | POA: Insufficient documentation

## 2021-01-31 DIAGNOSIS — R2681 Unsteadiness on feet: Secondary | ICD-10-CM | POA: Diagnosis not present

## 2021-01-31 DIAGNOSIS — M6281 Muscle weakness (generalized): Secondary | ICD-10-CM | POA: Insufficient documentation

## 2021-01-31 DIAGNOSIS — R29898 Other symptoms and signs involving the musculoskeletal system: Secondary | ICD-10-CM

## 2021-01-31 DIAGNOSIS — R269 Unspecified abnormalities of gait and mobility: Secondary | ICD-10-CM | POA: Diagnosis not present

## 2021-01-31 NOTE — Telephone Encounter (Signed)
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  The patient would benefit from an OT evaluation for decr fine motor skills/coordination in RUE.  If you agree, please place an order in [facility] workque in [facility] or fax the order to [phone]. Thank you, Sherlie Ban, PT, DPT 01/31/21 2:43 PM    [facility] [address] [place], [place]  [address] Phone:  [phone] Fax:  [phone]

## 2021-01-31 NOTE — Therapy (Signed)
[facility] [address] [place], [place], [address] Phone: [phone]   Fax:  [phone]  Physical Therapy Evaluation  Patient Details  Name: Crystal Park MRN: [id number] Date of Birth: 1960-11-30 Referring Provider (PT): Dr. Epimenio Foot   Encounter Date: 01/31/2021   PT End of Session - 01/31/21 1503     Visit Number 1    Number of Visits 17    Date for PT Re-Evaluation 05/01/21    Authorization Type [employer], VL: 60    PT Start Time 1103    PT Stop Time 1145    PT Time Calculation (min) 42 min    Equipment Utilized During Treatment Gait belt    Activity Tolerance Patient tolerated treatment well    Behavior During Therapy WFL for tasks assessed/performed             Past Medical History:  Diagnosis Date   Benign paroxysmal positional vertigo 10/12/2014   Constipation    Family history of breast cancer    Fatigue    Headache    history of migaines   History of kidney stones    History of MRSA infection    History of normocytic normochromic anemia    MS (multiple sclerosis) (HCC)    diagnosed 9/16   PONV (postoperative nausea and vomiting)     Past Surgical History:  Procedure Laterality Date   BREAST LUMPECTOMY Right    benign   COLONOSCOPY     CYSTOSCOPY WITH INJECTION N/A 04/23/2017   Procedure: CYSTOSCOPY WITH INJECTION/ BOTOX 200 UNITS;  Surgeon: Jerilee Field, MD;  Location: [facility] [place];  Service: Urology;  Laterality: N/A;   OOPHORECTOMY Left    2013   TONSILLECTOMY AND ADENOIDECTOMY     age 60   TRANSURETHRAL RESECTION OF BLADDER TUMOR N/A 04/23/2017   Procedure: TRANSURETHRAL RESECTION OF BLADDER TUMOR (TURBT);  Surgeon: Jerilee Field, MD;  Location: [facility];  Service: Urology;  Laterality: N/A;    There were no vitals filed for this visit.    Subjective Assessment - 01/31/21 1106     Subjective Diagnosed with RRMS in 2016. Having a lot of  trouble walking and lifting right leg. Needs to lift up the right leg with her arms in order to get into the house. Can't write very well. Mainly using SPC for smaller distances in the house. For longer distances will have to use her rollator or when more fatigued in the house. If going out to a restaurant or a longer distance in the community, will be pushed in a transport chair by her husband. Earlier this year, had a fall when he legs gave out and fell into the laundry basket. No recent falls. Spends a lot of time sitting on the couch. Not doing anything for exercise right now.Came here a few years ago for dizziness, reports it is much better now.    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    Patient Stated Goals Wants to be able to walk better.    Currently in Pain? Yes    Pain Score 5     Pain Location Leg    Pain Orientation Right    Pain Descriptors / Indicators Aching  will get spasms in RLE   Pain Type Chronic pain    Aggravating Factors  Walking too much    Pain Relieving Factors Aspirin                [facility] PT Assessment - 01/31/21 1113       Assessment   Medical Diagnosis RRMS    Referring Provider (PT) Dr. Felecia Shelling    Onset Date/Surgical Date 01/12/21    Hand Dominance Right    Prior Therapy in 2018 at this location for vestibular rehab      Precautions   Precautions Fall      Balance Screen   Has the patient fallen in the past 6 months No    Has the patient had a decrease in activity level because of a fear of falling?  No   just tired   Is the patient reluctant to leave their home because of a fear of falling?  No   not if someone is with pt     [place] residence    Living Arrangements Spouse/significant other    Type of [place] to enter    Entrance Stairs-Number of Steps 1    Entrance Stairs-Rails None   uses door frame and the cane   Home Layout Two level;Able to live on main level with bedroom/bathroom    Home Equipment Transport chair;Walker - 4 wheels;Grab bars - tub/shower;Grab bars - toilet;Shower seat   SPC with quad tip   Additional Comments Fatigue limits ability to participate in chores around the house. Reports husband takes care of most things around the house.      Prior Function   Level of Independence Independent with household mobility with device;Requires assistive device for independence;Needs assistance with homemaking    Leisure Used to love to read, quilting      Sensation   Light Touch Appears Intact      Coordination   Gross Motor Movements are Fluid and Coordinated No    Fine Motor Movements are Fluid and Coordinated No    Finger Nose Finger Test slower with RUE    Heel Shin Test limited RLE>LLE due to weakness.      Posture/Postural Control   Posture/Postural Control Postural limitations    Postural Limitations Forward head;Rounded Shoulders      ROM / Strength   AROM / PROM / Strength Strength      Strength   Strength Assessment Site Hip;Knee;Ankle    Right/Left Hip Right;Left    Right Hip Flexion 3-/5    Left Hip Flexion 4-/5    Right/Left Knee Right;Left    Right Knee Flexion 3+/5    Right Knee Extension 4/5    Left Knee Flexion 4/5    Left Knee Extension 4+/5    Right/Left Ankle Right;Left    Right Ankle Dorsiflexion 2+/5    Left Ankle Dorsiflexion 4+/5      Transfers   Transfers Sit to Stand;Stand to Sit    Sit to Stand 5: Supervision    Sit to Stand Details (indicate cue type and reason) Performed 1 rep without UE support with decr eccentric control.    Five time sit to stand comments  25.19 seconds with UE support    Stand to Sit 5: Supervision     Transfer Cueing Pt needs cues to lock rollator prior to sitting and standing.    Comments 30 second chair stand:  6 sit <> stands using UE support, rating 7/10 fatigue      Ambulation/Gait   Ambulation/Gait Yes    Ambulation/Gait Assistance 5: Supervision    Assistive device 4-wheeled walker    Gait Pattern Step-through pattern;Decreased stride length;Decreased dorsiflexion - right;Decreased weight shift to right;Right foot flat;Left foot flat;Narrow base of support   L toe in   Ambulation Surface Level;Indoor    Gait velocity 23.09 seconds = 1.42 ft/sec    Gait Comments Pt with incr shoulder elevation bilat during gait, lowered rollator handles for better positioning.      Standardized Balance Assessment   Standardized Balance Assessment Timed Up and Go Test      Timed Up and Go Test   Normal TUG (seconds) 29.47    TUG Comments with rollator                        Objective measurements completed on examination: See above findings.                PT Education - 01/31/21 1503     Education Details Clinical findings, POC. OT referral due to difficulties with fine motor coordination and handwriting.    Person(s) Educated Patient    Methods Explanation    Comprehension Verbalized understanding              PT Short Term Goals - 01/31/21 1505       PT SHORT TERM GOAL #1   Title Pt will be independent with initial HEP with family supervision in order to build upon functional gains made in therapy. ALL STGS DUE 02/28/21    Time 4    Period Weeks    Status New    Target Date 02/28/21      PT SHORT TERM GOAL #2   Title pt will undergo further assessment of BERG with LTG written.    Time 4    Period Weeks    Status New      PT SHORT TERM GOAL #3   Title Pt will improve 5x sit <> stand using UE support from chair to 21 seconds or less in order to demo improved functional BLE strength/decr fall risk.    Baseline 25.19 seconds    Time 4    Period  Weeks    Status New      PT SHORT TERM GOAL #4   Title Pt will improve gait speed with rollator to at least 1.8 ft/sec in order to demo decr fall risk.    Baseline 1.42 ft/sec with rollator.    Time 4    Period Weeks    Status New      PT SHORT TERM GOAL #5   Title Pt and pt's husband will verbalize understanding of fall prevention in the home.    Time 4    Period Weeks    Status New               PT Long Term Goals - 01/31/21 1508       PT LONG TERM GOAL #1   Title Pt will be independent with final HEP with family supervision in order to build upon functional gains made in therapy. ALL LTGS DUE 03/28/21    Time 8    Period Weeks    Status New    Target Date 03/28/21      PT LONG TERM GOAL #2   Title BERG goal to be written in order  to demo decr fall risk.    Baseline not yet assessed.    Time 8    Period Weeks    Status New      PT LONG TERM GOAL #3   Title Pt will improve 30 second chair stand to at least 8 sit <> stands using BUE support in order to demo improved strength/endurance.    Baseline 6 sit <> Stands    Time 8    Period Weeks    Status New      PT LONG TERM GOAL #4   Title Pt will perform TUG in 24 seconds or less with rollator vs. LRAD in order to demo decr fall risk.    Baseline 29.47 with rollator.    Time 8    Period Weeks    Status New      PT LONG TERM GOAL #5   Title Pt will improve gait speed with rollator to at least 2.1 ft/sec in order to demo decr fall risk/improved efficiency with gait.    Baseline 1.42 ft/sec    Time 8    Period Weeks    Status New                    Plan - 01/31/21 1510     Clinical Impression Statement Patient is a 60 year old female referred to Neuro OPPT for RRMS.    Pt's PMH is significant for: RRMS (diagnosed 2016). Pt having more worsening fatigue with fatigue and walking. The following deficits were present during the exam: decr strength, impaired balance, postural abnormalities, gait  abnormalities, impaired coordination, decr endurance/activity tolerance. Based on 5x sit <> stand, TUG, and gait speed with rollator pt is an incr risk for falls. Pt would benefit from skilled PT to address these impairments and functional limitations to maximize functional mobility independence.    Personal Factors and Comorbidities Comorbidity 1;Time since onset of injury/illness/exacerbation;Behavior Pattern;Past/Current Experience    Comorbidities PMH: RRMS (diagnosed 10/2014)    Examination-Activity Limitations Carry;Lift;Locomotion Level;Squat;Stairs;Transfers;Stand;Reach Overhead    Examination-Participation Restrictions Cleaning;Community Activity;Laundry    Stability/Clinical Decision Making Evolving/Moderate complexity    Clinical Decision Making Moderate    Rehab Potential Good    PT Frequency 2x / week    PT Duration 12 weeks    PT Treatment/Interventions ADLs/Self Care Home Management;Aquatic Therapy;DME Instruction;Stair training;Functional mobility training;Gait training;Balance training;Therapeutic activities;Neuromuscular re-education;Therapeutic exercise;Patient/family education;Passive range of motion;Vestibular    PT Next Visit Plan Perform BERG. Initial HEP for BLE strengthening/standing balance.    Recommended Other Services OT referral, PT sent referral request.    Consulted and Agree with Plan of Care Patient;Family member/caregiver    Family Member Consulted pt's daughter             Patient will benefit from skilled therapeutic intervention in order to improve the following deficits and impairments:  Abnormal gait, Decreased balance, Decreased activity tolerance, Decreased coordination, Decreased endurance, Decreased knowledge of use of DME, Decreased mobility, Decreased strength, Difficulty walking, Postural dysfunction, Pain  Visit Diagnosis: Unsteadiness on feet  Muscle weakness (generalized)  Other abnormalities of gait and mobility  Other symptoms and  signs involving the nervous system     Problem List Patient Active Problem List   Diagnosis Date Noted   Hypersomnia 01/12/2021   High risk medication use 01/06/2020   Vitamin D deficiency 01/06/2020   Normocytic anemia 09/26/2016   Hematuria    Urinary retention    Slow transit constipation    Yeast  infection    Acute lower UTI    Other complicated headache syndrome    Acute blood loss anemia    Reactive hypertension    Multiple sclerosis exacerbation ([place]) 04/24/2016   Neurogenic bladder 04/24/2016   Weakness of both arms    Weakness of both legs    Sepsis ([place]) 04/21/2016   Bilateral hydronephrosis 04/21/2016   ARF (acute renal failure) ([place]) 04/21/2016   Sepsis secondary to UTI ([place]) 04/20/2016   Attention deficit disorder 02/23/2016   Gait disturbance 11/24/2015   Other fatigue 11/24/2015   Urinary urgency 11/24/2015   Dehydration    UTI (lower urinary tract infection) 10/29/2015   Leukocytosis 10/29/2015   Nausea and vomiting 10/29/2015   MS (multiple sclerosis) ([place]) 10/26/2014    Arliss Journey, PT, DPT  01/31/2021, 3:16 PM  [facility] [address] [place], [place], [address] Phone: [phone]   Fax:  [phone]  Name: ALI HARRALSON MRN: [id number] Date of Birth: 1960/09/08

## 2021-02-06 ENCOUNTER — Encounter: Payer: Self-pay | Admitting: Physical Therapy

## 2021-02-06 ENCOUNTER — Other Ambulatory Visit: Payer: Self-pay

## 2021-02-06 ENCOUNTER — Ambulatory Visit: Payer: Managed Care, Other (non HMO) | Attending: Neurology | Admitting: Physical Therapy

## 2021-02-06 DIAGNOSIS — R2681 Unsteadiness on feet: Secondary | ICD-10-CM | POA: Insufficient documentation

## 2021-02-06 DIAGNOSIS — R2689 Other abnormalities of gait and mobility: Secondary | ICD-10-CM | POA: Insufficient documentation

## 2021-02-06 DIAGNOSIS — R29818 Other symptoms and signs involving the nervous system: Secondary | ICD-10-CM | POA: Insufficient documentation

## 2021-02-06 DIAGNOSIS — M6281 Muscle weakness (generalized): Secondary | ICD-10-CM | POA: Insufficient documentation

## 2021-02-06 NOTE — Patient Instructions (Signed)
Access Code: [email or web address] URL: [email or web address] Date: 02/06/2021 Prepared by: Sherlie Ban  Exercises Seated Long Arc Quad - 2 x daily - 5 x weekly - 2 sets - 5 reps Seated Hip Adduction Isometrics with Ball - 2 x daily - 5 x weekly - 2 sets - 5 reps Seated Hip Abduction with Resistance - 2 x daily - 5 x weekly - 2 sets - 5 reps Seated Active Hip Flexion - 2 x daily - 5 x weekly - 2 sets - 5 reps Seated March - 2 x daily - 5 x weekly - 2 sets - 5 reps

## 2021-02-06 NOTE — Therapy (Addendum)
[facility] [address] [place], [place], [address] Phone: [phone]   Fax:  [phone]  Physical Therapy Treatment  Patient Details  Name: Crystal Park MRN: [id number] Date of Birth: 1960/04/05 Referring Provider (PT): Dr. Epimenio Foot   Encounter Date: 02/06/2021   PT End of Session - 02/06/21 0852     Visit Number 2    Number of Visits 17    Date for PT Re-Evaluation 05/01/21    Authorization Type [employer], VL: 60    PT Start Time 0849    PT Stop Time 0929    PT Time Calculation (min) 40 min    Equipment Utilized During Treatment Gait belt    Activity Tolerance Patient tolerated treatment well    Behavior During Therapy [facility] for tasks assessed/performed             Past Medical History:  Diagnosis Date   Benign paroxysmal positional vertigo 10/12/2014   Constipation    Family history of breast cancer    Fatigue    Headache    history of migaines   History of kidney stones    History of MRSA infection    History of normocytic normochromic anemia    MS (multiple sclerosis) (HCC)    diagnosed 9/16   PONV (postoperative nausea and vomiting)     Past Surgical History:  Procedure Laterality Date   BREAST LUMPECTOMY Right    benign   COLONOSCOPY     CYSTOSCOPY WITH INJECTION N/A 04/23/2017   Procedure: CYSTOSCOPY WITH INJECTION/ BOTOX 200 UNITS;  Surgeon: Jerilee Field, MD;  Location: [facility] [employer];  Service: Urology;  Laterality: N/A;   OOPHORECTOMY Left    2013   TONSILLECTOMY AND ADENOIDECTOMY     age 18   TRANSURETHRAL RESECTION OF BLADDER TUMOR N/A 04/23/2017   Procedure: TRANSURETHRAL RESECTION OF BLADDER TUMOR (TURBT);  Surgeon: Jerilee Field, MD;  Location: [facility];  Service: Urology;  Laterality: N/A;    There were no vitals filed for this visit.   Subjective Assessment - 02/06/21 0852     Subjective No changes, no falls. Wearing her L foot up brace  today.    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    Patient Stated Goals Wants to be able to walk better.    Currently in Pain? No/denies                [facility] PT Assessment - 02/06/21 0916       Standardized Balance Assessment   Standardized Balance Assessment Berg Balance Test      Berg Balance Test   Sit to Stand Able to stand  independently using hands    Standing Unsupported Able to stand safely 2 minutes    Sitting with Back Unsupported but Feet Supported on Floor or Stool Able to sit safely and securely 2 minutes    Stand to Sit Controls descent by using hands    Transfers Able to transfer safely, definite need of hands    Standing Unsupported with Eyes Closed Able to stand 10 seconds with supervision    Standing Unsupported with Feet Together Able to place feet together independently and stand for 1 minute with supervision  Access Code: [email or web address] URL: [email or web address] Date: 02/06/2021 Prepared by: Sherlie Ban  Initiated HEP today. See MedBridge for more details.  Exercises Seated Long Arc Quad - 2 x daily - 5 x weekly - 2 sets - 5 reps Seated Hip Adduction Isometrics with Ball - 2 x daily - 5 x weekly - 2 sets - 5 reps Seated Hip Abduction with Resistance - 2 x daily - 5 x weekly - 2 sets - 5 reps - with use of red tband  Seated Active Hip Flexion - 2 x daily - 5 x weekly - 2 sets - 5 reps - with coming up tall and performing scap retraction  Seated March - 2 x daily - 5 x weekly - 2 sets - 5 reps                    PT Education - 02/06/21 0930     Education Details Initial HEP    Person(s) Educated Patient;Spouse     Methods Explanation;Demonstration;Handout    Comprehension Verbalized understanding;Returned demonstration;Verbal cues required              PT Short Term Goals - 01/31/21 1505       PT SHORT TERM GOAL #1   Title Pt will be independent with initial HEP with family supervision in order to build upon functional gains made in therapy. ALL STGS DUE 02/28/21    Time 4    Period Weeks    Status New    Target Date 02/28/21      PT SHORT TERM GOAL #2   Title pt will undergo further assessment of BERG with LTG written.    Time 4    Period Weeks    Status New      PT SHORT TERM GOAL #3   Title Pt will improve 5x sit <> stand using UE support from chair to 21 seconds or less in order to demo improved functional BLE strength/decr fall risk.    Baseline 25.19 seconds    Time 4    Period Weeks    Status New      PT SHORT TERM GOAL #4   Title Pt will improve gait speed with rollator to at least 1.8 ft/sec in order to demo decr fall risk.    Baseline 1.42 ft/sec with rollator.    Time 4    Period Weeks    Status New      PT SHORT TERM GOAL #5   Title Pt and pt's husband will verbalize understanding of fall prevention in the home.    Time 4    Period Weeks    Status New               PT Long Term Goals - 01/31/21 1508       PT LONG TERM GOAL #1   Title Pt will be independent with final HEP with family supervision in order to build upon functional gains made in therapy. ALL LTGS DUE 03/28/21    Time 8    Period Weeks    Status New    Target Date 03/28/21      PT LONG TERM GOAL #2   Title BERG goal to be written in order to demo decr fall risk.    Baseline not yet assessed.    Time 8    Period Weeks    Status New      PT LONG TERM GOAL #3   Title Pt will improve  30 second chair stand to at least 8 sit <> stands using BUE support in order to demo improved strength/endurance.    Baseline 6 sit <> Stands    Time 8    Period Weeks    Status New      PT LONG TERM  GOAL #4   Title Pt will perform TUG in 24 seconds or less with rollator vs. LRAD in order to demo decr fall risk.    Baseline 29.47 with rollator.    Time 8    Period Weeks    Status New      PT LONG TERM GOAL #5   Title Pt will improve gait speed with rollator to at least 2.1 ft/sec in order to demo decr fall risk/improved efficiency with gait.    Baseline 1.42 ft/sec    Time 8    Period Weeks    Status New                   Plan - 02/06/21 1156     Clinical Impression Statement Initiated seated HEP today for seated strength/posture. Pt fatigued more with RLE and it was difficult performing 10 reps at times. Discussed at home can break it down into 2 sets of 5. Initiated the BERG, but did not get to finish it due to time constraints. Will finish at next session. Will continue to progress towards LTGs.    Personal Factors and Comorbidities Comorbidity 1;Time since onset of injury/illness/exacerbation;Behavior Pattern;Past/Current Experience    Comorbidities PMH: RRMS (diagnosed 10/2014)    Examination-Activity Limitations Carry;Lift;Locomotion Level;Squat;Stairs;Transfers;Stand;Reach Overhead    Examination-Participation Restrictions Cleaning;Community Activity;Laundry    Stability/Clinical Decision Making Evolving/Moderate complexity    Rehab Potential Good    PT Frequency 2x / week    PT Duration 12 weeks    PT Treatment/Interventions ADLs/Self Care Home Management;Aquatic Therapy;DME Instruction;Stair training;Functional mobility training;Gait training;Balance training;Therapeutic activities;Neuromuscular re-education;Therapeutic exercise;Patient/family education;Passive range of motion;Vestibular    PT Next Visit Plan how is initial HEP? finish rest of BERG. seated/supine strength. SciFit/NuStep    Consulted and Agree with Plan of Care Patient;Family member/caregiver    Family Member Consulted pt's husband             Patient will benefit from skilled therapeutic  intervention in order to improve the following deficits and impairments:  Abnormal gait, Decreased balance, Decreased activity tolerance, Decreased coordination, Decreased endurance, Decreased knowledge of use of DME, Decreased mobility, Decreased strength, Difficulty walking, Postural dysfunction, Pain  Visit Diagnosis: Unsteadiness on feet  Muscle weakness (generalized)  Other abnormalities of gait and mobility     Problem List Patient Active Problem List   Diagnosis Date Noted   Hypersomnia 01/12/2021   High risk medication use 01/06/2020   Vitamin D deficiency 01/06/2020   Normocytic anemia 09/26/2016   Hematuria    Urinary retention    Slow transit constipation    Yeast infection    Acute lower UTI    Other complicated headache syndrome    Acute blood loss anemia    Reactive hypertension    Multiple sclerosis exacerbation (HCC) 04/24/2016   Neurogenic bladder 04/24/2016   Weakness of both arms    Weakness of both legs    Sepsis (HCC) 04/21/2016   Bilateral hydronephrosis 04/21/2016   ARF (acute renal failure) (HCC) 04/21/2016   Sepsis secondary to UTI (HCC) 04/20/2016   Attention deficit disorder 02/23/2016   Gait disturbance 11/24/2015   Other fatigue 11/24/2015   Urinary urgency 11/24/2015  Dehydration    UTI (lower urinary tract infection) 10/29/2015   Leukocytosis 10/29/2015   Nausea and vomiting 10/29/2015   MS (multiple sclerosis) ([place]) 10/26/2014    Arliss Journey, PT, DPT  02/06/2021, 11:58 AM  [place] [address] [place], [place], [address] Phone: [phone]   Fax:  [phone]  Name: Crystal Park MRN: [id number] Date of Birth: 1960/11/01

## 2021-02-07 ENCOUNTER — Encounter: Payer: Self-pay | Admitting: Neurology

## 2021-02-07 ENCOUNTER — Other Ambulatory Visit: Payer: Self-pay | Admitting: Neurology

## 2021-02-07 MED ORDER — DIAZEPAM 5 MG PO TABS: ORAL_TABLET | ORAL | 1 refills | Status: DC

## 2021-02-17 ENCOUNTER — Ambulatory Visit: Payer: Managed Care, Other (non HMO) | Admitting: Physical Therapy

## 2021-02-17 ENCOUNTER — Other Ambulatory Visit: Payer: Self-pay

## 2021-02-17 ENCOUNTER — Encounter: Payer: Self-pay | Admitting: Physical Therapy

## 2021-02-17 DIAGNOSIS — R2681 Unsteadiness on feet: Secondary | ICD-10-CM

## 2021-02-17 DIAGNOSIS — M6281 Muscle weakness (generalized): Secondary | ICD-10-CM

## 2021-02-17 DIAGNOSIS — R2689 Other abnormalities of gait and mobility: Secondary | ICD-10-CM

## 2021-02-17 NOTE — Therapy (Addendum)
[facility] [address] [place], [place], [address] Phone: [phone]   Fax:  [phone]  Physical Therapy Treatment  Patient Details  Name: Crystal Park MRN: [id number] Date of Birth: 03-25-1960 Referring Provider (PT): Dr. Felecia Shelling   Encounter Date: 02/17/2021   PT End of Session - 02/17/21 0720     Visit Number 3    Number of Visits 17    Date for PT Re-Evaluation 05/01/21    Authorization Type [employer], VL: 50    Authorization - Visit Number 2    Authorization - Number of Visits 60    PT Start Time 0718    PT Stop Time 0758    PT Time Calculation (min) 40 min    Equipment Utilized During Treatment Gait belt    Activity Tolerance Patient tolerated treatment well    Behavior During Therapy [facility] for tasks assessed/performed             Past Medical History:  Diagnosis Date   Benign paroxysmal positional vertigo 10/12/2014   Constipation    Family history of breast cancer    Fatigue    Headache    history of migaines   History of kidney stones    History of MRSA infection    History of normocytic normochromic anemia    MS (multiple sclerosis) ([place])    diagnosed 9/16   PONV (postoperative nausea and vomiting)     Past Surgical History:  Procedure Laterality Date   BREAST LUMPECTOMY Right    benign   COLONOSCOPY     CYSTOSCOPY WITH INJECTION N/A 04/23/2017   Procedure: CYSTOSCOPY WITH INJECTION/ BOTOX 200 UNITS;  Surgeon: Festus Aloe, MD;  Location: [facility];  Service: Urology;  Laterality: N/A;   OOPHORECTOMY Left    2013   TONSILLECTOMY AND ADENOIDECTOMY     age 60   TRANSURETHRAL RESECTION OF BLADDER TUMOR N/A 04/23/2017   Procedure: TRANSURETHRAL RESECTION OF BLADDER TUMOR (TURBT);  Surgeon: Festus Aloe, MD;  Location: [facility];  Service: Urology;  Laterality: N/A;    There were no vitals filed for this visit.   Subjective Assessment -  02/17/21 0721     Subjective No falls. Has been using her cane more at home. Legs are feeling stronger.    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    Patient Stated Goals Wants to be able to walk better.    Currently in Pain? No/denies                [facility] PT Assessment - 02/17/21 0721       Berg Balance Test   Sit to Stand Able to stand  independently using hands    Standing Unsupported Able to stand safely 2 minutes    Sitting with Back Unsupported but Feet Supported on Floor or Stool Able to sit safely and securely 2 minutes    Stand to Sit Controls descent by using hands    Transfers Able to transfer safely, definite need of hands    Standing Unsupported with Eyes Closed Able to stand 10 seconds with supervision    Standing Unsupported with Feet Together Able to place feet together independently and stand for 1  minute with supervision    From Standing, Reach Forward with Outstretched Arm Can reach forward >12 cm safely (5")    From Standing Position, Pick up Object from Floor Able to pick up shoe, needs supervision   unable to look down to floor due to dizziness   From Standing Position, Turn to Look Behind Over each Shoulder Looks behind from both sides and weight shifts well   reports feeling a little dizzy   Turn 360 Degrees Able to turn 360 degrees safely but slowly   22.25 to R, 21.69 to L   Standing Unsupported, Alternately Place Feet on Step/Stool Able to complete 4 steps without aid or supervision    Standing Unsupported, One Foot in Front Able to take small step independently and hold 30 seconds    Standing on One Leg Tries to lift leg/unable to hold 3 seconds but remains standing  independently    Total Score 40    Berg comment: 40/56 = significant fall risk                           [facility] Adult PT Treatment/Exercise - 02/17/21 0742       Exercises   Exercises Other Exercises    Other Exercises  Scapular retraction x10 reps in seated. Modified this exercise for pt to perform instead of leaning forwards and coming up to tall posture due to pt reporting incr dizziness with leaning forwards. Supine on table with incline wedge: 2 x 5 reps bridging, x5 reps each side - straight leg raises with cues for quad set first, limited ROM RLE>LLE, x10 reps each side SAQs over bolster, 2 x 5 reps alternating marching with cues for core activation. Pt needing rest breaks between exercises due to fatigue. Pt reporting feeling a little dizzy from supine > sit, resolved quickly.                     PT Education - 02/17/21 0800     Education Details Scheduling OT eval (pt is not interested in this at this time), changing forward lean to scap retraction for home to just performing scapular retraction.    Person(s) Educated Patient    Methods Explanation;Demonstration    Comprehension Verbalized understanding;Returned demonstration              PT Short Term Goals - 02/17/21 1205       PT SHORT TERM GOAL #1   Title Pt will be independent with initial HEP with family supervision in order to build upon functional gains made in therapy. ALL STGS DUE 02/28/21    Time 4    Period Weeks    Status New    Target Date 02/28/21      PT SHORT TERM GOAL #2   Title pt will undergo further assessment of BERG with LTG written.    Baseline 40/56 on 02/17/21    Time 4    Period Weeks    Status Achieved      PT SHORT TERM GOAL #3   Title Pt will improve 5x sit <> stand using UE support from chair to 21 seconds or less in order to demo improved functional BLE strength/decr fall risk.    Baseline 25.19 seconds    Time 4    Period Weeks    Status New       PT SHORT TERM GOAL #4   Title Pt will improve gait speed with  rollator to at least 1.8 ft/sec in order to demo decr fall risk.    Baseline 1.42 ft/sec with rollator.    Time 4    Period Weeks    Status New      PT SHORT TERM GOAL #5   Title Pt and pt's husband will verbalize understanding of fall prevention in the home.    Time 4    Period Weeks    Status New               PT Long Term Goals - 02/17/21 1205       PT LONG TERM GOAL #1   Title Pt will be independent with final HEP with family supervision in order to build upon functional gains made in therapy. ALL LTGS DUE 03/28/21    Time 8    Period Weeks    Status New    Target Date 03/28/21      PT LONG TERM GOAL #2   Title Pt will improve BERG to at least a 46/56 in order to demo decr fall risk.    Baseline 40/56    Time 8    Period Weeks    Status Revised      PT LONG TERM GOAL #3   Title Pt will improve 30 second chair stand to at least 8 sit <> stands using BUE support in order to demo improved strength/endurance.    Baseline 6 sit <> Stands    Time 8    Period Weeks    Status New      PT LONG TERM GOAL #4   Title Pt will perform TUG in 24 seconds or less with rollator vs. LRAD in order to demo decr fall risk.    Baseline 29.47 with rollator.    Time 8    Period Weeks    Status New      PT LONG TERM GOAL #5   Title Pt will improve gait speed with rollator to at least 2.1 ft/sec in order to demo decr fall risk/improved efficiency with gait.    Baseline 1.42 ft/sec    Time 8    Period Weeks    Status New                   Plan - 02/17/21 1206     Clinical Impression Statement Finished performing the BERG today with pt scoring a 40/56, putting pt at a significant risk of falls. Updated LTG as appropriate. Remainder of session focused on BLE strengthening while supine (with a wedge) on the mat table. Pt fatigued more easily with RLE. Needing rest breaks in between exercises due to fatigue. Will  continue to progress towards LTGs.    Personal Factors and Comorbidities Comorbidity 1;Time since onset of injury/illness/exacerbation;Behavior Pattern;Past/Current Experience    Comorbidities PMH: RRMS (diagnosed 10/2014)    Examination-Activity Limitations Carry;Lift;Locomotion Level;Squat;Stairs;Transfers;Stand;Reach Overhead    Examination-Participation Restrictions Cleaning;Community Activity;Laundry    Stability/Clinical Decision Making Evolving/Moderate complexity    Rehab Potential Good    PT Frequency 2x / week    PT Duration 12 weeks    PT Treatment/Interventions ADLs/Self Care Home Management;Aquatic Therapy;DME Instruction;Stair training;Functional mobility training;Gait training;Balance training;Therapeutic activities;Neuromuscular re-education;Therapeutic exercise;Patient/family education;Passive range of motion;Vestibular    PT Next Visit Plan Standing balance/standing tolerance. seated/supine strength. SciFit/NuStep. Gait training with cane. would benefit from vestibular assessment.    Consulted and Agree with Plan of Care Patient;Family member/caregiver    Family Member Consulted pt's husband  Patient will benefit from skilled therapeutic intervention in order to improve the following deficits and impairments:  Abnormal gait, Decreased balance, Decreased activity tolerance, Decreased coordination, Decreased endurance, Decreased knowledge of use of DME, Decreased mobility, Decreased strength, Difficulty walking, Postural dysfunction, Pain  Visit Diagnosis: Unsteadiness on feet  Muscle weakness (generalized)  Other abnormalities of gait and mobility     Problem List Patient Active Problem List   Diagnosis Date Noted   Hypersomnia 01/12/2021   High risk medication use 01/06/2020   Vitamin D deficiency 01/06/2020   Normocytic anemia 09/26/2016   Hematuria    Urinary retention    Slow transit constipation    Yeast infection    Acute lower UTI    Other  complicated headache syndrome    Acute blood loss anemia    Reactive hypertension    Multiple sclerosis exacerbation ([place]) 04/24/2016   Neurogenic bladder 04/24/2016   Weakness of both arms    Weakness of both legs    Sepsis ([place]) 04/21/2016   Bilateral hydronephrosis 04/21/2016   ARF (acute renal failure) ([place]) 04/21/2016   Sepsis secondary to UTI ([place]) 04/20/2016   Attention deficit disorder 02/23/2016   Gait disturbance 11/24/2015   Other fatigue 11/24/2015   Urinary urgency 11/24/2015   Dehydration    UTI (lower urinary tract infection) 10/29/2015   Leukocytosis 10/29/2015   Nausea and vomiting 10/29/2015   MS (multiple sclerosis) ([place]) 10/26/2014    Arliss Journey, PT, DPT  02/17/2021, 12:10 PM  [facility] [address] [place], [place], [address] Phone: [phone]   Fax:  [phone]  Name: MARKELLE ZARAGOZA MRN: [id number] Date of Birth: 01/29/1961

## 2021-03-01 ENCOUNTER — Encounter: Payer: Self-pay | Admitting: Physical Therapy

## 2021-03-01 ENCOUNTER — Other Ambulatory Visit: Payer: Self-pay

## 2021-03-01 ENCOUNTER — Ambulatory Visit: Payer: Managed Care, Other (non HMO) | Admitting: Physical Therapy

## 2021-03-01 DIAGNOSIS — R29818 Other symptoms and signs involving the nervous system: Secondary | ICD-10-CM

## 2021-03-01 DIAGNOSIS — R2689 Other abnormalities of gait and mobility: Secondary | ICD-10-CM

## 2021-03-01 DIAGNOSIS — R2681 Unsteadiness on feet: Secondary | ICD-10-CM | POA: Diagnosis not present

## 2021-03-01 DIAGNOSIS — M6281 Muscle weakness (generalized): Secondary | ICD-10-CM

## 2021-03-01 NOTE — Patient Instructions (Signed)
WALKING  Walking is a great form of exercise to increase your strength, endurance and overall fitness.  A walking program can help you start slowly and gradually build endurance as you go.  Everyone's ability is different, so each person's starting point will be different.  You do not have to follow them exactly.  The are just samples. You should simply find out what's right for you and stick to that program.   In the beginning, you'll start off walking 2-3 times a day for short distances.  As you get stronger, you'll be walking further at just 1-2 times per day.  A. You Can Walk For A Certain Length Of Time Each Day    Walk 1.5-2 minutes minutes 3 times per day.  Increase by 1 minute ~week.    Example:   Day 1-7 2 minutes 3 times per day   Day 8-14 3-4 minutes 2-3 times per day   Day 14-21 4-5 minutes 2-3 times per day  B. You Can Walk For a Certain Distance Each Day     Distance can be substituted for time.    Example:   2 trips - from the couch around through the dining room, kitchen, back around.

## 2021-03-01 NOTE — Therapy (Signed)
[facility] [address] [place], [place], [address] Phone: [phone]   Fax:  [phone]  Physical Therapy Treatment  Patient Details  Name: Crystal Park MRN: [id number] Date of Birth: May 29, 1960 Referring Provider (PT): Dr. Felecia Shelling   Encounter Date: 03/01/2021   PT End of Session - 03/01/21 0850     Visit Number 4    Number of Visits 17    Date for PT Re-Evaluation 05/01/21    Authorization Type [employer], VL: 16    Authorization - Visit Number 3    Authorization - Number of Visits 60    PT Start Time 0848    PT Stop Time 0928    PT Time Calculation (min) 40 min    Equipment Utilized During Treatment Gait belt    Activity Tolerance Patient tolerated treatment well    Behavior During Therapy [facility] for tasks assessed/performed             Past Medical History:  Diagnosis Date   Benign paroxysmal positional vertigo 10/12/2014   Constipation    Family history of breast cancer    Fatigue    Headache    history of migaines   History of kidney stones    History of MRSA infection    History of normocytic normochromic anemia    MS (multiple sclerosis) ([place])    diagnosed 9/16   PONV (postoperative nausea and vomiting)     Past Surgical History:  Procedure Laterality Date   BREAST LUMPECTOMY Right    benign   COLONOSCOPY     CYSTOSCOPY WITH INJECTION N/A 04/23/2017   Procedure: CYSTOSCOPY WITH INJECTION/ BOTOX 200 UNITS;  Surgeon: Festus Aloe, MD;  Location: [facility];  Service: Urology;  Laterality: N/A;   OOPHORECTOMY Left    2013   TONSILLECTOMY AND ADENOIDECTOMY     age 61   TRANSURETHRAL RESECTION OF BLADDER TUMOR N/A 04/23/2017   Procedure: TRANSURETHRAL RESECTION OF BLADDER TUMOR (TURBT);  Surgeon: Festus Aloe, MD;  Location: [facility];  Service: Urology;  Laterality: N/A;    There were no vitals filed for this visit.   Subjective Assessment -  03/01/21 0851     Subjective Had a fall the other day, missed the chair. Husband helped her up from the ground. Had a good Christmas.    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    Patient Stated Goals Wants to be able to walk better.    Currently in Pain? No/denies                               [facility] Adult PT Treatment/Exercise - 03/01/21 0918       Transfers   Transfers Sit to Stand;Stand to Sit    Sit to Stand 5: Supervision    Stand to Sit 5: Supervision    Comments Cued to lock rollator prior to sitting down.      Ambulation/Gait   Ambulation/Gait Yes    Ambulation/Gait Assistance 5: Supervision    Ambulation/Gait Assistance Details 85' with rollator took approx. 1 min 50 seconds, rating 5-6/10 fatigue afterwards. Discussed beginning a walking program at  home for endurance/aerobic activity with rollator (see pt instructions for more info).    Ambulation Distance (Feet) 115 Feet    Assistive device 4-wheeled walker    Gait Pattern Step-through pattern;Decreased stride length;Decreased dorsiflexion - right;Decreased weight shift to right;Right foot flat;Left foot flat;Narrow base of support    Ambulation Surface Level;Indoor      Exercises   Exercises Other Exercises    Other Exercises  Reviewed seated hip ABD with red tband x10 reps. 2 x 5 reps mini squats with intermittent UE support, cues for technique.                 Balance Exercises - 03/01/21 0900       Balance Exercises: Standing   Standing Eyes Opened Narrow base of support (BOS);Limitations    Standing Eyes Opened Limitations 2 x 30 seconds    Standing Eyes Closed Solid  surface;Limitations    Standing Eyes Closed Limitations With feet hip width 3 x 15 seconds    SLS with Vectors Solid surface;Limitations    SLS with Vectors Limitations Alternating SLS taps to 4" step, x10 reps each side, holding on to chair, pt does not look down due to dizziness    Standing, One Foot on a Step Eyes open;4 inch;Limitations    Standing, One Foot on a Step Limitations x2 reps each side, ~10 seconds, incr difficutly performing with RLE. Min guard for balance                PT Education - 03/01/21 0958     Education Details Walking program for home with rollator.    Person(s) Educated Patient;Spouse    Methods Explanation;Handout    Comprehension Verbalized understanding              PT Short Term Goals - 02/17/21 1205       PT SHORT TERM GOAL #1   Title Pt will be independent with initial HEP with family supervision in order to build upon functional gains made in therapy. ALL STGS DUE 02/28/21    Time 4    Period Weeks    Status New    Target Date 02/28/21      PT SHORT TERM GOAL #2   Title pt will undergo further assessment of BERG with LTG written.    Baseline 40/56 on 02/17/21    Time 4    Period Weeks    Status Achieved      PT SHORT TERM GOAL #3   Title Pt will improve 5x sit <> stand using UE support from chair to 21 seconds or less in order to demo improved functional BLE strength/decr fall risk.    Baseline 25.19 seconds    Time 4    Period Weeks    Status New      PT SHORT TERM GOAL #4   Title Pt will improve gait speed with rollator to at least 1.8 ft/sec in order to demo decr fall risk.    Baseline 1.42 ft/sec with rollator.    Time 4    Period Weeks    Status New      PT SHORT TERM GOAL #5   Title Pt and pt's husband will verbalize understanding of fall prevention in the home.    Time 4    Period Weeks    Status New               PT Long Term Goals - 02/17/21 1205  PT LONG TERM GOAL #1   Title Pt will be  independent with final HEP with family supervision in order to build upon functional gains made in therapy. ALL LTGS DUE 03/28/21    Time 8    Period Weeks    Status New    Target Date 03/28/21      PT LONG TERM GOAL #2   Title Pt will improve BERG to at least a 46/56 in order to demo decr fall risk.    Baseline 40/56    Time 8    Period Weeks    Status Revised      PT LONG TERM GOAL #3   Title Pt will improve 30 second chair stand to at least 8 sit <> stands using BUE support in order to demo improved strength/endurance.    Baseline 6 sit <> Stands    Time 8    Period Weeks    Status New      PT LONG TERM GOAL #4   Title Pt will perform TUG in 24 seconds or less with rollator vs. LRAD in order to demo decr fall risk.    Baseline 29.47 with rollator.    Time 8    Period Weeks    Status New      PT LONG TERM GOAL #5   Title Pt will improve gait speed with rollator to at least 2.1 ft/sec in order to demo decr fall risk/improved efficiency with gait.    Baseline 1.42 ft/sec    Time 8    Period Weeks    Status New                   Plan - 03/01/21 1000     Clinical Impression Statement Today's skilled session focused on BLE strengthening, standing tolerance/balance, and gait with rollator. Added walking program with rollator for home to work on aerobic activity/endurance. Pt needing intermittent seated rest breaks after standing balance due to fatigue. Will continue to progress towards LTGs.    Personal Factors and Comorbidities Comorbidity 1;Time since onset of injury/illness/exacerbation;Behavior Pattern;Past/Current Experience    Comorbidities PMH: RRMS (diagnosed 10/2014)    Examination-Activity Limitations Carry;Lift;Locomotion Level;Squat;Stairs;Transfers;Stand;Reach Overhead    Examination-Participation Restrictions Cleaning;Community Activity;Laundry    Stability/Clinical Decision Making Evolving/Moderate complexity    Rehab Potential Good    PT Frequency 2x /  week    PT Duration 12 weeks    PT Treatment/Interventions ADLs/Self Care Home Management;Aquatic Therapy;DME Instruction;Stair training;Functional mobility training;Gait training;Balance training;Therapeutic activities;Neuromuscular re-education;Therapeutic exercise;Patient/family education;Passive range of motion;Vestibular    PT Next Visit Plan check STGs. Standing balance/standing tolerance. seated/supine strength. SciFit/NuStep. Gait training with cane. would benefit from vestibular assessment.    Consulted and Agree with Plan of Care Patient;Family member/caregiver    Family Member Consulted pt's husband             Patient will benefit from skilled therapeutic intervention in order to improve the following deficits and impairments:  Abnormal gait, Decreased balance, Decreased activity tolerance, Decreased coordination, Decreased endurance, Decreased knowledge of use of DME, Decreased mobility, Decreased strength, Difficulty walking, Postural dysfunction, Pain  Visit Diagnosis: Unsteadiness on feet  Muscle weakness (generalized)  Other abnormalities of gait and mobility  Other symptoms and signs involving the nervous system     Problem List Patient Active Problem List   Diagnosis Date Noted   Hypersomnia 01/12/2021   High risk medication use 01/06/2020   Vitamin D deficiency 01/06/2020   Normocytic anemia 09/26/2016  Hematuria    Urinary retention    Slow transit constipation    Yeast infection    Acute lower UTI    Other complicated headache syndrome    Acute blood loss anemia    Reactive hypertension    Multiple sclerosis exacerbation ([place]) 04/24/2016   Neurogenic bladder 04/24/2016   Weakness of both arms    Weakness of both legs    Sepsis ([place]) 04/21/2016   Bilateral hydronephrosis 04/21/2016   ARF (acute renal failure) ([place]) 04/21/2016   Sepsis secondary to UTI ([place]) 04/20/2016   Attention deficit disorder 02/23/2016   Gait disturbance 11/24/2015    Other fatigue 11/24/2015   Urinary urgency 11/24/2015   Dehydration    UTI (lower urinary tract infection) 10/29/2015   Leukocytosis 10/29/2015   Nausea and vomiting 10/29/2015   MS (multiple sclerosis) ([place]) 10/26/2014    Arliss Journey, PT, DPT  03/01/2021, 10:01 AM  [facility] [address] [place], [place], [address] Phone: [phone]   Fax:  [phone]  Name: Crystal Park MRN: [id number] Date of Birth: May 05, 1960

## 2021-03-02 ENCOUNTER — Ambulatory Visit: Payer: Managed Care, Other (non HMO) | Admitting: Physical Therapy

## 2021-03-03 ENCOUNTER — Inpatient Hospital Stay (HOSPITAL_COMMUNITY)
Admission: EM | Admit: 2021-03-03 | Discharge: 2021-03-08 | DRG: 683 | Disposition: A | Discharge status: Rehabilitation Facility | Payer: Managed Care, Other (non HMO) | Attending: Internal Medicine | Admitting: Internal Medicine

## 2021-03-03 ENCOUNTER — Other Ambulatory Visit: Payer: Self-pay

## 2021-03-03 DIAGNOSIS — Z20822 Contact with and (suspected) exposure to covid-19: Secondary | ICD-10-CM | POA: Diagnosis present

## 2021-03-03 DIAGNOSIS — Z79899 Other long term (current) drug therapy: Secondary | ICD-10-CM

## 2021-03-03 DIAGNOSIS — Z803 Family history of malignant neoplasm of breast: Secondary | ICD-10-CM

## 2021-03-03 DIAGNOSIS — R531 Weakness: Secondary | ICD-10-CM

## 2021-03-03 DIAGNOSIS — N319 Neuromuscular dysfunction of bladder, unspecified: Secondary | ICD-10-CM | POA: Diagnosis present

## 2021-03-03 DIAGNOSIS — Z681 Body mass index (BMI) 19 or less, adult: Secondary | ICD-10-CM

## 2021-03-03 DIAGNOSIS — N179 Acute kidney failure, unspecified: Principal | ICD-10-CM | POA: Diagnosis present

## 2021-03-03 DIAGNOSIS — G35 Multiple sclerosis: Secondary | ICD-10-CM | POA: Diagnosis present

## 2021-03-03 DIAGNOSIS — E860 Dehydration: Secondary | ICD-10-CM | POA: Diagnosis present

## 2021-03-03 DIAGNOSIS — R5381 Other malaise: Secondary | ICD-10-CM | POA: Diagnosis present

## 2021-03-03 DIAGNOSIS — E872 Acidosis, unspecified: Secondary | ICD-10-CM | POA: Diagnosis present

## 2021-03-03 DIAGNOSIS — Z79890 Hormone replacement therapy (postmenopausal): Secondary | ICD-10-CM

## 2021-03-03 DIAGNOSIS — N390 Urinary tract infection, site not specified: Secondary | ICD-10-CM | POA: Diagnosis present

## 2021-03-03 DIAGNOSIS — B349 Viral infection, unspecified: Secondary | ICD-10-CM | POA: Diagnosis present

## 2021-03-03 DIAGNOSIS — R64 Cachexia: Secondary | ICD-10-CM | POA: Diagnosis present

## 2021-03-03 DIAGNOSIS — G35D Multiple sclerosis, unspecified: Secondary | ICD-10-CM | POA: Diagnosis present

## 2021-03-03 DIAGNOSIS — B9620 Unspecified Escherichia coli [E. coli] as the cause of diseases classified elsewhere: Secondary | ICD-10-CM | POA: Diagnosis present

## 2021-03-03 DIAGNOSIS — Z7983 Long term (current) use of bisphosphonates: Secondary | ICD-10-CM

## 2021-03-03 DIAGNOSIS — E876 Hypokalemia: Secondary | ICD-10-CM | POA: Diagnosis present

## 2021-03-03 HISTORY — DX: Neuromuscular dysfunction of bladder, unspecified: N31.9

## 2021-03-03 LAB — CBC WITH DIFFERENTIAL/PLATELET
Abs Immature Granulocytes: 0.2 10*3/uL — ABNORMAL HIGH (ref 0.00–0.07)
Basophils Absolute: 0 10*3/uL (ref 0.0–0.1)
Basophils Relative: 0 %
Eosinophils Absolute: 0 10*3/uL (ref 0.0–0.5)
Eosinophils Relative: 0 %
HCT: 52.5 % — ABNORMAL HIGH (ref 36.0–46.0)
Hemoglobin: 17.1 g/dL — ABNORMAL HIGH (ref 12.0–15.0)
Immature Granulocytes: 1 %
Lymphocytes Relative: 5 %
Lymphs Abs: 0.8 10*3/uL (ref 0.7–4.0)
MCH: 32 pg (ref 26.0–34.0)
MCHC: 32.6 g/dL (ref 30.0–36.0)
MCV: 98.3 fL (ref 80.0–100.0)
Monocytes Absolute: 1.2 10*3/uL — ABNORMAL HIGH (ref 0.1–1.0)
Monocytes Relative: 7 %
Neutro Abs: 14.2 10*3/uL — ABNORMAL HIGH (ref 1.7–7.7)
Neutrophils Relative %: 87 %
Platelets: 431 10*3/uL — ABNORMAL HIGH (ref 150–400)
RBC: 5.34 MIL/uL — ABNORMAL HIGH (ref 3.87–5.11)
RDW: 12.7 % (ref 11.5–15.5)
WBC: 16.4 10*3/uL — ABNORMAL HIGH (ref 4.0–10.5)
nRBC: 0 % (ref 0.0–0.2)

## 2021-03-03 LAB — LACTIC ACID, PLASMA: Lactic Acid, Venous: 1.4 mmol/L (ref 0.5–1.9)

## 2021-03-03 NOTE — ED Provider Notes (Signed)
Emergency Medicine Provider Triage Evaluation Note  Crystal Park , a 60 y.o. female  was evaluated in triage.  Pt complains of weakness, headache, abd pain. Normally self caths at home and cannot due to weakness  Review of Systems  Positive: Weakness, headache, abd pain Negative: fevers  Physical Exam  BP (!) 148/102    Pulse (!) 141    Temp 98.6 F (37 C) (Oral)    Resp 17    SpO2 96%  Gen:   Awake, no distress   Resp:  Normal effort  MSK:   Moves extremities without difficulty  Other:  Suprapubic ttp  Medical Decision Making  Medically screening exam initiated at 11:28 PM.  Appropriate orders placed.  Crystal Park was informed that the remainder of the evaluation will be completed by another provider, this initial triage assessment does not replace that evaluation, and the importance of remaining in the ED until their evaluation is complete.     Rayne Du 03/03/21 2330    Geoffery Lyons, MD 03/04/21 [phone]

## 2021-03-03 NOTE — ED Triage Notes (Signed)
Pt c/o abdominal pain and generalized weakness. Has had NV. Lower Abdominal pain onset x 3 days, self caths at home, last time earlier this afternoon. Has a history of MS and noted decreased ability to ambulate, usually walks with cane/walkers. Today pt reports being too weak to ambulate. Husband with pt.

## 2021-03-04 ENCOUNTER — Emergency Department (HOSPITAL_COMMUNITY): Payer: Managed Care, Other (non HMO)

## 2021-03-04 ENCOUNTER — Encounter (HOSPITAL_COMMUNITY): Payer: Self-pay | Admitting: Internal Medicine

## 2021-03-04 DIAGNOSIS — N179 Acute kidney failure, unspecified: Secondary | ICD-10-CM | POA: Diagnosis not present

## 2021-03-04 LAB — COMPREHENSIVE METABOLIC PANEL
ALT: 22 U/L (ref 0–44)
AST: 18 U/L (ref 15–41)
Albumin: 4.8 g/dL (ref 3.5–5.0)
Alkaline Phosphatase: 78 U/L (ref 38–126)
Anion gap: 18 — ABNORMAL HIGH (ref 5–15)
BUN: 11 mg/dL (ref 6–20)
CO2: 12 mmol/L — ABNORMAL LOW (ref 22–32)
Calcium: 9.7 mg/dL (ref 8.9–10.3)
Chloride: 106 mmol/L (ref 98–111)
Creatinine, Ser: 1.16 mg/dL — ABNORMAL HIGH (ref 0.44–1.00)
GFR, Estimated: 55 mL/min — ABNORMAL LOW (ref >60)
Glucose, Bld: 126 mg/dL — ABNORMAL HIGH (ref 70–99)
Potassium: 4.5 mmol/L (ref 3.5–5.1)
Sodium: 136 mmol/L (ref 135–145)
Total Bilirubin: 1.2 mg/dL (ref 0.3–1.2)
Total Protein: 8.4 g/dL — ABNORMAL HIGH (ref 6.5–8.1)

## 2021-03-04 LAB — URINALYSIS, ROUTINE W REFLEX MICROSCOPIC
Bacteria, UA: NONE SEEN
Bilirubin Urine: NEGATIVE
Bilirubin Urine: NEGATIVE
Glucose, UA: NEGATIVE mg/dL
Glucose, UA: NEGATIVE mg/dL
Ketones, ur: 80 mg/dL — AB
Ketones, ur: 80 mg/dL — AB
Leukocytes,Ua: NEGATIVE
Nitrite: NEGATIVE
Nitrite: NEGATIVE
Protein, ur: >=300 mg/dL — AB
Protein, ur: >=300 mg/dL — AB
Specific Gravity, Urine: 1.023 (ref 1.005–1.030)
Specific Gravity, Urine: 1.022 (ref 1.005–1.030)
pH: 6 (ref 5.0–8.0)
pH: 5 (ref 5.0–8.0)

## 2021-03-04 LAB — RESP PANEL BY RT-PCR (FLU A&B, COVID) ARPGX2
Influenza A by PCR: NEGATIVE
Influenza B by PCR: NEGATIVE
SARS Coronavirus 2 by RT PCR: NEGATIVE

## 2021-03-04 LAB — TROPONIN I (HIGH SENSITIVITY): Troponin I (High Sensitivity): 21 ng/L — ABNORMAL HIGH (ref <18)

## 2021-03-04 LAB — HIV ANTIBODY (ROUTINE TESTING W REFLEX): HIV Screen 4th Generation wRfx: NONREACTIVE

## 2021-03-04 MED ORDER — ENOXAPARIN SODIUM 40 MG/0.4ML IJ SOSY
40.0000 mg | PREFILLED_SYRINGE | INTRAMUSCULAR | Status: DC
  Administered 2021-03-04 – 2021-03-07 (×4): 40 mg via SUBCUTANEOUS
  Filled 2021-03-04 (×4): qty 0.4

## 2021-03-04 MED ORDER — SODIUM CHLORIDE 0.9 % IV BOLUS
1000.0000 mL | Freq: Once | INTRAVENOUS | Status: AC
  Administered 2021-03-04: 1000 mL via INTRAVENOUS

## 2021-03-04 MED ORDER — SODIUM CHLORIDE 0.9% FLUSH
3.0000 mL | Freq: Two times a day (BID) | INTRAVENOUS | Status: DC
  Administered 2021-03-04 – 2021-03-08 (×7): 3 mL via INTRAVENOUS

## 2021-03-04 MED ORDER — LACTATED RINGERS IV SOLN: INTRAVENOUS | Status: DC

## 2021-03-04 MED ORDER — HYDRALAZINE HCL 20 MG/ML IJ SOLN: 5.0000 mg | INTRAMUSCULAR | Status: DC | PRN

## 2021-03-04 MED ORDER — ACETAMINOPHEN 325 MG PO TABS
650.0000 mg | ORAL_TABLET | Freq: Four times a day (QID) | ORAL | Status: DC | PRN
  Administered 2021-03-04 (×2): 650 mg via ORAL
  Filled 2021-03-04 (×2): qty 2

## 2021-03-04 MED ORDER — ACETAMINOPHEN 500 MG PO TABS
1000.0000 mg | ORAL_TABLET | Freq: Once | ORAL | Status: AC
  Administered 2021-03-04: 1000 mg via ORAL
  Filled 2021-03-04: qty 2

## 2021-03-04 MED ORDER — OXYBUTYNIN CHLORIDE 5 MG PO TABS
5.0000 mg | ORAL_TABLET | Freq: Every day | ORAL | Status: DC
  Administered 2021-03-05 – 2021-03-08 (×4): 5 mg via ORAL
  Filled 2021-03-04 (×5): qty 1

## 2021-03-04 MED ORDER — MORPHINE SULFATE (PF) 2 MG/ML IV SOLN: 2.0000 mg | INTRAVENOUS | Status: DC | PRN

## 2021-03-04 MED ORDER — LEVOTHYROXINE SODIUM 88 MCG PO TABS
88.0000 ug | ORAL_TABLET | Freq: Every day | ORAL | Status: DC
  Administered 2021-03-05 – 2021-03-08 (×4): 88 ug via ORAL
  Filled 2021-03-04 (×4): qty 1

## 2021-03-04 MED ORDER — DIMETHYL FUMARATE 240 MG PO CPDR
1.0000 | DELAYED_RELEASE_CAPSULE | Freq: Two times a day (BID) | ORAL | Status: DC
Start: 2021-03-04 — End: 2021-03-08
  Administered 2021-03-05 – 2021-03-08 (×7): 240 mg via ORAL
  Filled 2021-03-04 (×7): qty 1

## 2021-03-04 MED ORDER — LACTATED RINGERS IV BOLUS
1000.0000 mL | Freq: Once | INTRAVENOUS | Status: AC
  Administered 2021-03-04: 1000 mL via INTRAVENOUS

## 2021-03-04 MED ORDER — ONDANSETRON HCL 4 MG/2ML IJ SOLN
4.0000 mg | Freq: Four times a day (QID) | INTRAMUSCULAR | Status: DC | PRN
  Administered 2021-03-04 – 2021-03-05 (×3): 4 mg via INTRAVENOUS
  Filled 2021-03-04 (×3): qty 2

## 2021-03-04 MED ORDER — ACETAMINOPHEN 650 MG RE SUPP: 650.0000 mg | Freq: Four times a day (QID) | RECTAL | Status: DC | PRN

## 2021-03-04 MED ORDER — OXYCODONE HCL 5 MG PO TABS
5.0000 mg | ORAL_TABLET | ORAL | Status: DC | PRN
  Administered 2021-03-05: 5 mg via ORAL
  Filled 2021-03-04: qty 1

## 2021-03-04 MED ORDER — ONDANSETRON HCL 4 MG/2ML IJ SOLN
4.0000 mg | Freq: Once | INTRAMUSCULAR | Status: AC
  Administered 2021-03-04: 4 mg via INTRAVENOUS
  Filled 2021-03-04: qty 2

## 2021-03-04 MED ORDER — IOHEXOL 350 MG/ML SOLN
100.0000 mL | Freq: Once | INTRAVENOUS | Status: AC | PRN
  Administered 2021-03-04: 100 mL via INTRAVENOUS

## 2021-03-04 MED ORDER — ONDANSETRON HCL 4 MG PO TABS
4.0000 mg | ORAL_TABLET | Freq: Four times a day (QID) | ORAL | Status: DC | PRN
  Administered 2021-03-05: 4 mg via ORAL
  Filled 2021-03-04 (×2): qty 1

## 2021-03-04 NOTE — H&P (Signed)
History and Physical    Patient: Crystal Park GMW:[id number] DOB: 14-Aug-1960 DOA: 03/03/2021 DOS: the patient was seen and examined on 03/04/2021 PCP: Daisy Floro, MD  Patient coming from: Home - lives with husband; NOK: Husband, [phone]  Chief Complaint: nausea, weakness  HPI: Crystal Park is a 60 y.o. female with medical history significant of MS presenting with n/v and abdominal pain.   She reports nausea and inability to eat.  Her side hurts - R leg.  She usually uses a cane or walker but yesterday she was too weak to get up and couldn't move her legs (either one).  She has similar symptoms in the past with a UTI.  She had 2 loose stools, one on 12/27 and one on 12/28.  Throughout the illness, she has been unable to keep anything down with phlegm and dry heaves.  She was able to tolerate a little bit of applesauce and a few sips of Gatorade; she felt quite nauseated and maybe didn't keep much down.  She feels better after meds, no vomiting since yesterday afternoon.  No fevers.  No sick contacts.    ER Course:  Wednesday with n/v/d, minimal PO since.  Progressive weakness, unable to walk even with walker.  AF, P120, BP ok.  Dehydrated.  Normal lactate, mild AKI, CO2 12.  WBC 16, Hgb 17 - likely hemoconcentrated.  No UTI.  COVID/flu negative.  Imaging unremarkable.  Diarrhea improved, nauseated and still unable to tolerate PO.    Review of Systems: ROS reviewed and negative except as above Past Medical History:  Diagnosis Date   Benign paroxysmal positional vertigo 10/12/2014   Constipation    Family history of breast cancer    Headache    history of migaines   History of kidney stones    History of MRSA infection    History of normocytic normochromic anemia    MS (multiple sclerosis) (HCC)    diagnosed 9/16   Neurogenic bladder    self-caths   PONV (postoperative nausea and vomiting)    Past Surgical History:  Procedure Laterality Date   BREAST  LUMPECTOMY Right    benign   COLONOSCOPY     CYSTOSCOPY WITH INJECTION N/A 04/23/2017   Procedure: CYSTOSCOPY WITH INJECTION/ BOTOX 200 UNITS;  Surgeon: Jerilee Field, MD;  Location: [facility] [place];  Service: Urology;  Laterality: N/A;   OOPHORECTOMY Left    2013   TONSILLECTOMY AND ADENOIDECTOMY     age 24   TRANSURETHRAL RESECTION OF BLADDER TUMOR N/A 04/23/2017   Procedure: TRANSURETHRAL RESECTION OF BLADDER TUMOR (TURBT);  Surgeon: Jerilee Field, MD;  Location: [facility];  Service: Urology;  Laterality: N/A;   Social History:  reports that she has never smoked. She has never used smokeless tobacco. She reports that she does not currently use alcohol. She reports that she does not use drugs.  Allergies  Allergen Reactions   Adderall [Amphetamine-Dextroamphetamine] Other (See Comments)    constipation    History reviewed. No pertinent family history.  Prior to Admission medications   Medication Sig Start Date End Date Taking? Authorizing Provider  alendronate (FOSAMAX) 70 MG tablet Take 70 mg by mouth once a week. Take with a full glass of water on an empty stomach.    [provider]  CRANBERRY PO Take 1 capsule by mouth daily.    [provider]  diazepam (VALIUM) 5 MG tablet Take up to three times a day po prn dizziness  02/07/21   Sater, Pearletha Furl, MD  Dimethyl Fumarate 240 MG CPDR TAKE 1 CAPSULE TWICE A DAY 01/23/21   Sater, Pearletha Furl, MD  levothyroxine (SYNTHROID) 88 MCG tablet levothyroxine 88 mcg tablet    [provider]  Multiple Vitamin (MULTIVITAMIN) tablet Take 1 tablet by mouth daily.    [provider]  oxybutynin (DITROPAN) 5 MG tablet 5 mg daily.  11/25/16   [provider]    Physical Exam: Vitals:   03/04/21 1235 03/04/21 1300 03/04/21 1315 03/04/21 1400  BP: 140/67 (!) 146/74 (!) 154/83 139/82  Pulse: (!) 118 (!) 117 (!) 114 (!) 114  Resp: 14 17 16 16   Temp:      TempSrc:       SpO2: 100% 99% 98% 98%   General:  Appears frail and chronically ill, cachectic Eyes:  PERRL, EOMI, normal lids, iris ENT:  grossly normal hearing, lips & tongue, dry mm Neck:  no LAD, masses or thyromegaly Cardiovascular:  RR with mild tachycardia, no m/r/g. No LE edema.  Respiratory:   CTA bilaterally with no wheezes/rales/rhonchi.  Normal respiratory effort. Abdomen:  soft, NT, ND Skin:  no rash or induration seen on limited exam Musculoskeletal:  decreased tone BUE/BLE, no bony abnormality Psychiatric:  flat mood and affect, speech sparse but appropriate Neurologic:  CN 2-12 grossly intact, generalized weakness   Radiological Exams on Admission: Independently reviewed - see discussion in A/P where applicable  CT HEAD WO CONTRAST ( )  Result Date: 03/04/2021 CLINICAL DATA:  Altered mental status. EXAM: CT HEAD WITHOUT CONTRAST TECHNIQUE: Contiguous axial images were obtained from the base of the skull through the vertex without intravenous contrast. COMPARISON:  December 12, 2008. FINDINGS: Brain: Mild chronic ischemic white matter disease is noted. Old left cerebellar infarction is noted. No mass effect or midline shift is noted. Ventricular size is within normal limits. There is no evidence of mass lesion, hemorrhage or acute infarction. Vascular: No hyperdense vessel or unexpected calcification. Skull: Normal. Negative for fracture or focal lesion. Sinuses/Orbits: No acute finding. Other: None. IMPRESSION: No acute intracranial abnormality seen. Electronically Signed   By: December 14, 2008 M.D.   On: 03/04/2021 12:55   CT Abdomen Pelvis W Contrast  Result Date: 03/04/2021 CLINICAL DATA:  Nausea, vomiting and lower abdomen pain for 3 weeks. EXAM: CT ABDOMEN AND PELVIS WITH CONTRAST TECHNIQUE: Multidetector CT imaging of the abdomen and pelvis was performed using the standard protocol following bolus administration of intravenous contrast. CONTRAST:  03/06/2021 OMNIPAQUE IOHEXOL 350 MG/ML  SOLN COMPARISON:  April 20, 2016 FINDINGS: Lower chest: Minimal dependent atelectasis of posterior lung bases are noted. The heart size normal. Hepatobiliary: No focal liver abnormality is seen. No gallstones, gallbladder wall thickening, or biliary dilatation. Pancreas: Unremarkable. No pancreatic ductal dilatation or surrounding inflammatory changes. Spleen: Normal in size without focal abnormality. Adrenals/Urinary Tract: Adrenal glands are unremarkable. Kidneys are normal, without renal calculi, focal lesion, or hydronephrosis. Bladder is unremarkable. Stomach/Bowel: Stomach is within normal limits. There is a minimal hiatal hernia. Appendix appears normal. No evidence of bowel wall thickening, distention, or inflammatory changes. Vascular/Lymphatic: Aortic atherosclerosis. No enlarged abdominal or pelvic lymph nodes. Reproductive: Uterus and bilateral adnexa are unremarkable. Probable uterine fibroid unchanged. Other: None Musculoskeletal: Stable probable calcified herniated disc at T10-11 is unchanged. IMPRESSION: 1. No acute abnormality identified in the abdomen and pelvis. 2. Aortic atherosclerosis. Aortic Atherosclerosis (ICD10-I70.0). Electronically Signed   By: April 22, 2016 M.D.   On: 03/04/2021 12:59    EKG:  Independently reviewed.  Sinus tachycardia with rate 136; nonspecific ST changes that may be rate-related   Labs on Admission: I have personally reviewed the available labs and imaging studies at the time of the admission.  Pertinent labs:  CO2 12 Glucose 126 BUN 11/Creatinine 1.16/GFR 55; 25/0.65/99 in 01/2020 Anion gap 18   Lactate 1.4 WBC 16.4 Hgb 17.1 Platelets 431 COVID/flu negative UA: moderate Hgb, 80 ketones, >300 protein, rare bacteria    Assessment/Plan * AKI (acute kidney injury) (HCC)- (present on admission) -Patient with several days of n/v and decreased PO, 2 episodes of diarrhea -Negative CT A/P -Suspect viral gastroenteritis, now improved -However, she  is dehydrated with AKI and metabolic acidosis as a result -Will observe, hydrate -Monitor on telemetry given persistent tachycardia -Gentle IVF hydration -Zofran as needed -If n/v/d recurs, consider further evaluation  Neurogenic bladder- (present on admission) -Patient self-caths q4h prn at home -Will order standing self-caths q4h -UA appears clean at this time  Generalized weakness -Resulting from dehydration, n/v -Anticipate improvement with rehydration -However, she is debilitated at baseline due to MS and so recovery may be delayed -She does PT at [facility] -Will order PT/OT consults for tomorrow   MS (multiple sclerosis) (HCC)- (present on admission) -Significant debility at baseline -This may be a flare but husband reports that this is not c/w usual flares -For now will not plan steroids/MRI but this could be considered if patient is not improving -Continue dimethyl fumarate    Advance Care Planning:   Code Status: Full Code   Consults: PT/OT/Nutrition  Family Communication: Husband was present throughout evaluation  Severity of Illness: The appropriate patient status for this patient is OBSERVATION. Observation status is judged to be reasonable and necessary in order to provide the required intensity of service to ensure the patient's safety. The patient's presenting symptoms, physical exam findings, and initial radiographic and laboratory data in the context of their medical condition is felt to place them at decreased risk for further clinical deterioration. Furthermore, it is anticipated that the patient will be medically stable for discharge from the hospital within 2 midnights of admission.   Author: Jonah Blue 03/04/2021 3:02 PM  For on call review [email or web address].

## 2021-03-04 NOTE — Assessment & Plan Note (Addendum)
-  Resulting from dehydration, n/v -Anticipate improvement with rehydration -However, she is debilitated at baseline due to MS and so recovery may be delayed -She does PT at [facility]

## 2021-03-04 NOTE — Assessment & Plan Note (Addendum)
-  Patient with several days of n/v and decreased PO, 2 episodes of diarrhea -Negative CT A/P, still has abdominal tenderness which is diffuse. -Suspect viral gastroenteritis, now improved -However, she is dehydrated with AKI and metabolic acidosis as a result Continue with IV hydration, continue symptomatic control.

## 2021-03-04 NOTE — ED Notes (Signed)
Sort NT informed this triage RN pt has elevated HR. HR 141 in triage. HR now 130. EKG was obtained in triage. Rounded on pt. Pt denies CP/SOB. Family with pt. Discussed pt pending room placed, requests report any onset of CP/SOB, or any new symptoms.

## 2021-03-04 NOTE — Assessment & Plan Note (Signed)
-  Patient self-caths q4h prn at home -Will order standing self-caths q4h -UA appears clean at this time

## 2021-03-04 NOTE — Assessment & Plan Note (Addendum)
-  Significant debility at baseline -This may be a flare but husband reports that this is not c/w usual flares Discussed with neurology and appreciate their consultation.  MRI brain and C-spine and T-spine negative for any acute demyelination syndrome.  Will monitor neuro recommendation.  Continue twice a day NIF monitoring. -Continue dimethyl fumarate

## 2021-03-04 NOTE — ED Provider Notes (Signed)
[facility] EMERGENCY DEPARTMENT Provider Note   CSN: [id number] Arrival date & time: 03/03/21  2038     History Chief Complaint  Patient presents with   Weakness   Abdominal Pain    Crystal Park is a 60 y.o. female.  Patient with hx MS presents with poor appetite for past 3-4 days. Symptoms acute onset, moderate, constant, persistent. Very little po intake during that time, with increased generalized weakness. No focal or unilateral weakness. No change in speech or vision. Hx similar symptoms when had utis in past. Did have diarrhea a few days ago, better now. No abd pain or distension. No vomiting. No dysuria. Self caths at baseline. No fever/chills. Mild nasal congestion. No sore throat or cough/sob. No chest pain or discomfort.    Weakness Associated symptoms: abdominal pain, diarrhea and nausea   Associated symptoms: no chest pain, no cough, no dysuria, no fever, no headaches, no shortness of breath and no vomiting   Abdominal Pain Associated symptoms: diarrhea and nausea   Associated symptoms: no chest pain, no chills, no cough, no dysuria, no fever, no shortness of breath, no sore throat and no vomiting       Past Medical History:  Diagnosis Date   Benign paroxysmal positional vertigo 10/12/2014   Constipation    Family history of breast cancer    Fatigue    Headache    history of migaines   History of kidney stones    History of MRSA infection    History of normocytic normochromic anemia    MS (multiple sclerosis) ([place])    diagnosed 9/16   PONV (postoperative nausea and vomiting)     Patient Active Problem List   Diagnosis Date Noted   Hypersomnia 01/12/2021   High risk medication use 01/06/2020   Vitamin D deficiency 01/06/2020   Normocytic anemia 09/26/2016   Hematuria    Urinary retention    Slow transit constipation    Yeast infection    Acute lower UTI    Other complicated headache syndrome    Acute blood loss anemia     Reactive hypertension    Multiple sclerosis exacerbation ([place]) 04/24/2016   Neurogenic bladder 04/24/2016   Weakness of both arms    Weakness of both legs    Sepsis ([place]) 04/21/2016   Bilateral hydronephrosis 04/21/2016   ARF (acute renal failure) ([place]) 04/21/2016   Sepsis secondary to UTI ([place]) 04/20/2016   Attention deficit disorder 02/23/2016   Gait disturbance 11/24/2015   Other fatigue 11/24/2015   Urinary urgency 11/24/2015   Dehydration    UTI (lower urinary tract infection) 10/29/2015   Leukocytosis 10/29/2015   Nausea and vomiting 10/29/2015   MS (multiple sclerosis) ([place]) 10/26/2014    Past Surgical History:  Procedure Laterality Date   BREAST LUMPECTOMY Right    benign   COLONOSCOPY     CYSTOSCOPY WITH INJECTION N/A 04/23/2017   Procedure: CYSTOSCOPY WITH INJECTION/ BOTOX 200 UNITS;  Surgeon: Festus Aloe, MD;  Location: [facility];  Service: Urology;  Laterality: N/A;   OOPHORECTOMY Left    2013   TONSILLECTOMY AND ADENOIDECTOMY     age 5   TRANSURETHRAL RESECTION OF BLADDER TUMOR N/A 04/23/2017   Procedure: TRANSURETHRAL RESECTION OF BLADDER TUMOR (TURBT);  Surgeon: Festus Aloe, MD;  Location: [facility];  Service: Urology;  Laterality: N/A;     OB History   No obstetric history on file.     No family history on  file.  Social History   Tobacco Use   Smoking status: Never   Smokeless tobacco: Never  Substance Use Topics   Alcohol use: Yes    Alcohol/week: 0.0 standard drinks    Comment: rare   Drug use: No    Home Medications Prior to Admission medications   Medication Sig Start Date End Date Taking? Authorizing Provider  alendronate (FOSAMAX) 70 MG tablet Take 70 mg by mouth once a week. Take with a full glass of water on an empty stomach.    [provider]  CRANBERRY PO Take 1 capsule by mouth daily.    [provider]  diazepam (VALIUM) 5 MG tablet Take up to three times a day po prn  dizziness 02/07/21   Sater, Pearletha Furl, MD  Dimethyl Fumarate 240 MG CPDR TAKE 1 CAPSULE TWICE A DAY 01/23/21   Sater, Pearletha Furl, MD  levothyroxine (SYNTHROID) 88 MCG tablet levothyroxine 88 mcg tablet    [provider]  Multiple Vitamin (MULTIVITAMIN) tablet Take 1 tablet by mouth daily.    [provider]  oxybutynin (DITROPAN) 5 MG tablet 5 mg daily.  11/25/16   [provider]    Allergies    Adderall [amphetamine-dextroamphetamine]  Review of Systems   Review of Systems  Constitutional:  Negative for chills and fever.  HENT:  Negative for sore throat.   Eyes:  Negative for pain and redness.  Respiratory:  Negative for cough and shortness of breath.   Cardiovascular:  Negative for chest pain and leg swelling.  Gastrointestinal:  Positive for abdominal pain, diarrhea and nausea. Negative for vomiting.  Genitourinary:  Negative for dysuria and flank pain.  Musculoskeletal:  Negative for back pain, neck pain and neck stiffness.  Skin:  Negative for rash.  Neurological:  Positive for weakness. Negative for headaches.  Hematological:  Does not bruise/bleed easily.  Psychiatric/Behavioral:  Negative for confusion.    Physical Exam Updated Vital Signs BP (!) 152/92    Pulse (!) 128    Temp 98.6 F (37 C)    Resp 18    SpO2 98%   Physical Exam Vitals and nursing note reviewed.  Constitutional:      Appearance: Normal appearance. She is well-developed.  HENT:     Head: Atraumatic.     Nose: Nose normal.     Mouth/Throat:     Pharynx: No oropharyngeal exudate or posterior oropharyngeal erythema.     Comments: Mouth dry.  Eyes:     General: No scleral icterus.    Conjunctiva/sclera: Conjunctivae normal.     Pupils: Pupils are equal, round, and reactive to light.  Neck:     Trachea: No tracheal deviation.     Comments: No stiffness or rigidity.  Cardiovascular:     Rate and Rhythm: Regular rhythm. Tachycardia present.     Pulses: Normal pulses.      Heart sounds: Normal heart sounds. No murmur heard.   No friction rub. No gallop.  Pulmonary:     Effort: Pulmonary effort is normal. No respiratory distress.     Breath sounds: Normal breath sounds.  Abdominal:     General: Bowel sounds are normal. There is no distension.     Palpations: Abdomen is soft. There is no mass.     Tenderness: There is no abdominal tenderness. There is no guarding.  Genitourinary:    Comments: No cva tenderness.  Musculoskeletal:        General: No swelling or tenderness.  Cervical back: Normal range of motion and neck supple. No rigidity. No muscular tenderness.  Skin:    General: Skin is warm and dry.     Findings: No rash.  Neurological:     Mental Status: She is alert.     Comments: Alert, speech normal. Motor/sens grossly intact bil.   Psychiatric:        Mood and Affect: Mood normal.    ED Results / Procedures / Treatments   Labs (all labs ordered are listed, but only abnormal results are displayed) Results for orders placed or performed during the hospital encounter of 03/03/21  Resp Panel by RT-PCR (Flu A&B, Covid) Nasopharyngeal Swab   Specimen: Nasopharyngeal Swab; Nasopharyngeal(NP) swabs in vial transport medium  Result Value Ref Range   SARS Coronavirus 2 by RT PCR NEGATIVE NEGATIVE   Influenza A by PCR NEGATIVE NEGATIVE   Influenza B by PCR NEGATIVE NEGATIVE  Lactic acid, plasma  Result Value Ref Range   Lactic Acid, Venous 1.4 0.5 - 1.9 mmol/L  Comprehensive metabolic panel  Result Value Ref Range   Sodium 136 135 - 145 mmol/L   Potassium 4.5 3.5 - 5.1 mmol/L   Chloride 106 98 - 111 mmol/L   CO2 12 (L) 22 - 32 mmol/L   Glucose, Bld 126 (H) 70 - 99 mg/dL   BUN 11 6 - 20 mg/dL   Creatinine, Ser 1.91 (H) 0.44 - 1.00 mg/dL   Calcium 9.7 8.9 - 47.8 mg/dL   Total Protein 8.4 (H) 6.5 - 8.1 g/dL   Albumin 4.8 3.5 - 5.0 g/dL   AST 18 15 - 41 U/L   ALT 22 0 - 44 U/L   Alkaline Phosphatase 78 38 - 126 U/L   Total Bilirubin 1.2  0.3 - 1.2 mg/dL   GFR, Estimated 55 (L) >60 mL/min   Anion gap 18 (H) 5 - 15  CBC with Differential  Result Value Ref Range   WBC 16.4 (H) 4.0 - 10.5 K/uL   RBC 5.34 (H) 3.87 - 5.11 MIL/uL   Hemoglobin 17.1 (H) 12.0 - 15.0 g/dL   HCT 29.5 (H) 62.1 - 30.8 %   MCV 98.3 80.0 - 100.0 fL   MCH 32.0 26.0 - 34.0 pg   MCHC 32.6 30.0 - 36.0 g/dL   RDW 65.7 84.6 - 96.2 %   Platelets 431 (H) 150 - 400 K/uL   nRBC 0.0 0.0 - 0.2 %   Neutrophils Relative % 87 %   Neutro Abs 14.2 (H) 1.7 - 7.7 K/uL   Lymphocytes Relative 5 %   Lymphs Abs 0.8 0.7 - 4.0 K/uL   Monocytes Relative 7 %   Monocytes Absolute 1.2 (H) 0.1 - 1.0 K/uL   Eosinophils Relative 0 %   Eosinophils Absolute 0.0 0.0 - 0.5 K/uL   Basophils Relative 0 %   Basophils Absolute 0.0 0.0 - 0.1 K/uL   Immature Granulocytes 1 %   Abs Immature Granulocytes 0.20 (H) 0.00 - 0.07 K/uL  Urinalysis, Routine w reflex microscopic Urine, Clean Catch  Result Value Ref Range   Color, Urine YELLOW YELLOW   APPearance CLEAR CLEAR   Specific Gravity, Urine 1.023 1.005 - 1.030   pH 6.0 5.0 - 8.0   Glucose, UA NEGATIVE NEGATIVE mg/dL   Hgb urine dipstick MODERATE (A) NEGATIVE   Bilirubin Urine NEGATIVE NEGATIVE   Ketones, ur 80 (A) NEGATIVE mg/dL   Protein, ur >=952 (A) NEGATIVE mg/dL   Nitrite NEGATIVE NEGATIVE   Leukocytes,Ua SMALL (  A) NEGATIVE   RBC / HPF 0-5 0 - 5 RBC/hpf   WBC, UA 0-5 0 - 5 WBC/hpf   Bacteria, UA NONE SEEN NONE SEEN   Squamous Epithelial / LPF 0-5 0 - 5   Mucus PRESENT    Hyaline Casts, UA PRESENT   Urinalysis, Routine w reflex microscopic Urine, Catheterized  Result Value Ref Range   Color, Urine YELLOW YELLOW   APPearance HAZY (A) CLEAR   Specific Gravity, Urine 1.022 1.005 - 1.030   pH 5.0 5.0 - 8.0   Glucose, UA NEGATIVE NEGATIVE mg/dL   Hgb urine dipstick MODERATE (A) NEGATIVE   Bilirubin Urine NEGATIVE NEGATIVE   Ketones, ur 80 (A) NEGATIVE mg/dL   Protein, ur >=300 (A) NEGATIVE mg/dL   Nitrite NEGATIVE  NEGATIVE   Leukocytes,Ua NEGATIVE NEGATIVE   RBC / HPF 0-5 0 - 5 RBC/hpf   WBC, UA 0-5 0 - 5 WBC/hpf   Bacteria, UA RARE (A) NONE SEEN   Squamous Epithelial / LPF 0-5 0 - 5   Mucus PRESENT    Hyaline Casts, UA PRESENT     EKG EKG Interpretation  Date/Time:  Saturday March 04 2021 00:16:24 EST Ventricular Rate:  136 PR Interval:  120 QRS Duration: 70 QT Interval:  288 QTC Calculation: 433 R Axis:   80 Text Interpretation: Sinus tachycardia Nonspecific ST abnormality Confirmed by Lajean Saver [phone]) on 03/04/2021 8:44:25 AM  Radiology CT HEAD WO CONTRAST (5MM)  Result Date: 03/04/2021 CLINICAL DATA:  Altered mental status. EXAM: CT HEAD WITHOUT CONTRAST TECHNIQUE: Contiguous axial images were obtained from the base of the skull through the vertex without intravenous contrast. COMPARISON:  December 12, 2008. FINDINGS: Brain: Mild chronic ischemic white matter disease is noted. Old left cerebellar infarction is noted. No mass effect or midline shift is noted. Ventricular size is within normal limits. There is no evidence of mass lesion, hemorrhage or acute infarction. Vascular: No hyperdense vessel or unexpected calcification. Skull: Normal. Negative for fracture or focal lesion. Sinuses/Orbits: No acute finding. Other: None. IMPRESSION: No acute intracranial abnormality seen. Electronically Signed   By: Marijo Conception M.D.   On: 03/04/2021 12:55   CT Abdomen Pelvis W Contrast  Result Date: 03/04/2021 CLINICAL DATA:  Nausea, vomiting and lower abdomen pain for 3 weeks. EXAM: CT ABDOMEN AND PELVIS WITH CONTRAST TECHNIQUE: Multidetector CT imaging of the abdomen and pelvis was performed using the standard protocol following bolus administration of intravenous contrast. CONTRAST:  143mL OMNIPAQUE IOHEXOL 350 MG/ML SOLN COMPARISON:  April 20, 2016 FINDINGS: Lower chest: Minimal dependent atelectasis of posterior lung bases are noted. The heart size normal. Hepatobiliary: No focal liver  abnormality is seen. No gallstones, gallbladder wall thickening, or biliary dilatation. Pancreas: Unremarkable. No pancreatic ductal dilatation or surrounding inflammatory changes. Spleen: Normal in size without focal abnormality. Adrenals/Urinary Tract: Adrenal glands are unremarkable. Kidneys are normal, without renal calculi, focal lesion, or hydronephrosis. Bladder is unremarkable. Stomach/Bowel: Stomach is within normal limits. There is a minimal hiatal hernia. Appendix appears normal. No evidence of bowel wall thickening, distention, or inflammatory changes. Vascular/Lymphatic: Aortic atherosclerosis. No enlarged abdominal or pelvic lymph nodes. Reproductive: Uterus and bilateral adnexa are unremarkable. Probable uterine fibroid unchanged. Other: None Musculoskeletal: Stable probable calcified herniated disc at T10-11 is unchanged. IMPRESSION: 1. No acute abnormality identified in the abdomen and pelvis. 2. Aortic atherosclerosis. Aortic Atherosclerosis (ICD10-I70.0). Electronically Signed   By: Abelardo Diesel M.D.   On: 03/04/2021 12:59    Procedures Procedures   Medications  Ordered in ED Medications  sodium chloride 0.9 % bolus 1,000 mL (has no administration in time range)    ED Course  I have reviewed the triage vital signs and the nursing notes.  Pertinent labs & imaging results that were available during my care of the patient were reviewed by me and considered in my medical decision making (see chart for details).    MDM Rules/Calculators/A&P                         Iv ns bolus. Zofran iv.   Labs sent.   Reviewed nursing notes and prior charts for additional history.   Labs reviewed/interpreted by me - wbc elevated. UA w ketones but without obvious infection.   Additional fluids.   CT reviewed/interpreted by me - no hem, no sbo.   Patient remains tachycardic, w nausea. Given dehydration, persistent symptoms, too weak to walk, pt requests admission.  Hospitalist consulted  for admission.     Final Clinical Impression(s) / ED Diagnoses Final diagnoses:  None    Rx / DC Orders ED Discharge Orders     None        Lajean Saver, MD 03/06/21 [phone]

## 2021-03-05 ENCOUNTER — Inpatient Hospital Stay (HOSPITAL_COMMUNITY): Payer: Managed Care, Other (non HMO)

## 2021-03-05 DIAGNOSIS — N390 Urinary tract infection, site not specified: Secondary | ICD-10-CM | POA: Diagnosis present

## 2021-03-05 DIAGNOSIS — R2689 Other abnormalities of gait and mobility: Secondary | ICD-10-CM | POA: Diagnosis present

## 2021-03-05 DIAGNOSIS — E860 Dehydration: Secondary | ICD-10-CM

## 2021-03-05 DIAGNOSIS — E872 Acidosis, unspecified: Secondary | ICD-10-CM | POA: Diagnosis present

## 2021-03-05 DIAGNOSIS — Z833 Family history of diabetes mellitus: Secondary | ICD-10-CM | POA: Diagnosis not present

## 2021-03-05 DIAGNOSIS — Z20822 Contact with and (suspected) exposure to covid-19: Secondary | ICD-10-CM | POA: Diagnosis present

## 2021-03-05 DIAGNOSIS — N319 Neuromuscular dysfunction of bladder, unspecified: Secondary | ICD-10-CM | POA: Diagnosis present

## 2021-03-05 DIAGNOSIS — G35 Multiple sclerosis: Secondary | ICD-10-CM

## 2021-03-05 DIAGNOSIS — N342 Other urethritis: Secondary | ICD-10-CM | POA: Diagnosis present

## 2021-03-05 DIAGNOSIS — Z681 Body mass index (BMI) 19 or less, adult: Secondary | ICD-10-CM | POA: Diagnosis not present

## 2021-03-05 DIAGNOSIS — H5500 Unspecified nystagmus: Secondary | ICD-10-CM | POA: Diagnosis present

## 2021-03-05 DIAGNOSIS — R000 Tachycardia, unspecified: Secondary | ICD-10-CM | POA: Diagnosis present

## 2021-03-05 DIAGNOSIS — R531 Weakness: Secondary | ICD-10-CM

## 2021-03-05 DIAGNOSIS — Z8614 Personal history of Methicillin resistant Staphylococcus aureus infection: Secondary | ICD-10-CM | POA: Diagnosis not present

## 2021-03-05 DIAGNOSIS — E876 Hypokalemia: Secondary | ICD-10-CM | POA: Diagnosis present

## 2021-03-05 DIAGNOSIS — R64 Cachexia: Secondary | ICD-10-CM | POA: Diagnosis present

## 2021-03-05 DIAGNOSIS — Z803 Family history of malignant neoplasm of breast: Secondary | ICD-10-CM | POA: Diagnosis not present

## 2021-03-05 DIAGNOSIS — N179 Acute kidney failure, unspecified: Secondary | ICD-10-CM | POA: Diagnosis present

## 2021-03-05 DIAGNOSIS — R519 Headache, unspecified: Secondary | ICD-10-CM | POA: Diagnosis present

## 2021-03-05 DIAGNOSIS — K5901 Slow transit constipation: Secondary | ICD-10-CM | POA: Diagnosis present

## 2021-03-05 DIAGNOSIS — Z79899 Other long term (current) drug therapy: Secondary | ICD-10-CM | POA: Diagnosis not present

## 2021-03-05 DIAGNOSIS — Z87442 Personal history of urinary calculi: Secondary | ICD-10-CM | POA: Diagnosis not present

## 2021-03-05 DIAGNOSIS — B349 Viral infection, unspecified: Secondary | ICD-10-CM | POA: Diagnosis present

## 2021-03-05 DIAGNOSIS — K5900 Constipation, unspecified: Secondary | ICD-10-CM | POA: Diagnosis present

## 2021-03-05 DIAGNOSIS — Z79890 Hormone replacement therapy (postmenopausal): Secondary | ICD-10-CM | POA: Diagnosis not present

## 2021-03-05 DIAGNOSIS — R5381 Other malaise: Secondary | ICD-10-CM | POA: Diagnosis present

## 2021-03-05 DIAGNOSIS — Z7983 Long term (current) use of bisphosphonates: Secondary | ICD-10-CM | POA: Diagnosis not present

## 2021-03-05 DIAGNOSIS — A499 Bacterial infection, unspecified: Secondary | ICD-10-CM | POA: Diagnosis not present

## 2021-03-05 DIAGNOSIS — R5383 Other fatigue: Secondary | ICD-10-CM

## 2021-03-05 DIAGNOSIS — B9620 Unspecified Escherichia coli [E. coli] as the cause of diseases classified elsewhere: Secondary | ICD-10-CM | POA: Diagnosis present

## 2021-03-05 LAB — CBC
HCT: 40.3 % (ref 36.0–46.0)
Hemoglobin: 13.2 g/dL (ref 12.0–15.0)
MCH: 31.6 pg (ref 26.0–34.0)
MCHC: 32.8 g/dL (ref 30.0–36.0)
MCV: 96.4 fL (ref 80.0–100.0)
Platelets: 329 10*3/uL (ref 150–400)
RBC: 4.18 MIL/uL (ref 3.87–5.11)
RDW: 13.2 % (ref 11.5–15.5)
WBC: 11.9 10*3/uL — ABNORMAL HIGH (ref 4.0–10.5)
nRBC: 0 % (ref 0.0–0.2)

## 2021-03-05 LAB — BASIC METABOLIC PANEL
Anion gap: 12 (ref 5–15)
BUN: 8 mg/dL (ref 6–20)
CO2: 18 mmol/L — ABNORMAL LOW (ref 22–32)
Calcium: 8.8 mg/dL — ABNORMAL LOW (ref 8.9–10.3)
Chloride: 111 mmol/L (ref 98–111)
Creatinine, Ser: 0.74 mg/dL (ref 0.44–1.00)
GFR, Estimated: >60 mL/min (ref >60)
Glucose, Bld: 101 mg/dL — ABNORMAL HIGH (ref 70–99)
Potassium: 4.2 mmol/L (ref 3.5–5.1)
Sodium: 141 mmol/L (ref 135–145)

## 2021-03-05 LAB — CK: Total CK: 67 U/L (ref 38–234)

## 2021-03-05 MED ORDER — GADOBUTROL 1 MMOL/ML IV SOLN
6.0000 mL | Freq: Once | INTRAVENOUS | Status: AC | PRN
  Administered 2021-03-05: 6 mL via INTRAVENOUS

## 2021-03-05 MED ORDER — ACETAMINOPHEN 325 MG PO TABS
650.0000 mg | ORAL_TABLET | Freq: Four times a day (QID) | ORAL | Status: DC | PRN
  Administered 2021-03-05 – 2021-03-07 (×2): 650 mg via ORAL
  Filled 2021-03-05 (×3): qty 2

## 2021-03-05 MED ORDER — ACETAMINOPHEN 650 MG RE SUPP: 650.0000 mg | Freq: Four times a day (QID) | RECTAL | Status: DC | PRN

## 2021-03-05 MED ORDER — SODIUM CHLORIDE 0.9 % IV SOLN: INTRAVENOUS | Status: DC

## 2021-03-05 NOTE — Progress Notes (Signed)
RT note: Patient had a VC of 3.0 and a NIF of -10. Patient seemed to have good understanding with NIF but best result obtained was -10. I don't know if results has more to do with poor coordination or poor lung compliance.With a VC of 3.0 I feel it was more a coordination issue. RT tried several types of connection and instructions to obtain a better result. Patient in no distress at this time.

## 2021-03-05 NOTE — Progress Notes (Signed)
I/O cath last done at 1730. Pt states she feels the need to void, pt asking to be I/O catheterized, pt husband a bedside. This rn palpated pt bladder, no distention noted. I/O cath done at pt request, 100 clear yellow urine returned

## 2021-03-05 NOTE — Consult Note (Signed)
Neurology Consult H&P  Crystal Park MR# [id number] 03/05/2021   CC: right lower extremity weakness  History is obtained from: husband, patient and chart.  HPI: Crystal Park is a 61 y.o. female PMHx as reviewed below, MS first presented in 2010 and diagnosed in 2016 on DMF (followed by outside neurologist) developed N/V and diarrhea 12/27 and 12/28. N/V persisted and the patient could not eat since 03/03/2021.  Her husband became concerned when she started to become somnolent over her baseline somnolence and brought her in.   ROS: A complete ROS was performed and is negative except as noted in the HPI.   Past Medical History:  Diagnosis Date   Benign paroxysmal positional vertigo 10/12/2014   Constipation    Family history of breast cancer    Headache    history of migaines   History of kidney stones    History of MRSA infection    History of normocytic normochromic anemia    MS (multiple sclerosis) ([facility])    diagnosed 9/16   Neurogenic bladder    self-caths   PONV (postoperative nausea and vomiting)      History reviewed. No pertinent family history.  Social History:  reports that she has never smoked. She has never used smokeless tobacco. She reports that she does not currently use alcohol. She reports that she does not use drugs.   Prior to Admission medications   Medication Sig Start Date End Date Taking? Authorizing Provider  alendronate (FOSAMAX) 70 MG tablet Take 70 mg by mouth once a week. Take with a full glass of water on an empty stomach.   Yes [provider]  aspirin-acetaminophen-caffeine (EXCEDRIN MIGRAINE) [phone] MG tablet Take 2 tablets by mouth every 6 (six) hours as needed for headache.   Yes [provider]  bismuth subsalicylate (PEPTO BISMOL) 262 MG chewable tablet Chew 524 mg by mouth as needed for indigestion or diarrhea or loose stools.   Yes [provider]  CRANBERRY PO Take 1 capsule by mouth daily.   Yes  [provider]  diazepam (VALIUM) 5 MG tablet Take up to three times a day po prn dizziness Patient taking differently: Take 5 mg by mouth 3 (three) times daily as needed (dizziness). 02/07/21  Yes Sater, Pearletha Furl, MD  Dimethyl Fumarate 240 MG CPDR TAKE 1 CAPSULE TWICE A DAY Patient taking differently: Take 240 mg by mouth 2 (two) times daily. 01/23/21  Yes Sater, Pearletha Furl, MD  levothyroxine (SYNTHROID) 88 MCG tablet Take 88 mcg by mouth daily before breakfast.   Yes [provider]  Multiple Vitamin (MULTIVITAMIN) tablet Take 1 tablet by mouth daily.   Yes [provider]  oxybutynin (DITROPAN) 5 MG tablet Take 5 mg by mouth daily. 11/25/16  Yes [provider]    Exam: Current vital signs: BP 119/77    Pulse (!) 107    Temp 98.6 F (37 C)    Resp 14    SpO2 97%   Physical Exam  Constitutional: Appears well-developed and adequately nourished.  Psych: Affect restricted. Eyes: No scleral injection HENT: No OP obstruction. Head: Normocephalic.  Cardiovascular: Normal rate and regular rhythm.  Respiratory: Effort normal, symmetric excursions bilaterally, no audible wheezing. GI: Soft.  No distension. There is no tenderness. Skin: WDI  Neuro: Mental Status: Patient is awake, alert, oriented to person, place, year.  Speech bradylalia with hypophonia fluent, intact comprehension and repetition. Visual Fields are full. Pupils are equal, round, and reactive to light.  EOMI without ptosis or diploplia.  Facial sensation is symmetric to temperature Facial movement is symmetric.  Hearing is intact to voice. Uvula midline and palate elevates symmetrically. Shoulder shrug is symmetric. Tongue is midline without atrophy or fasciculations.  Tone is increased in extremities. Bulk is normal.  RUE ~3+/5; LUE ~4+/5 RLE ~4-/5 except hip flexion ~3/5; LLE ~4/5 Sensation is symmetric to light touch and temperature in the arms and legs. DTRs symmetric and brisk  throughout however RLE  Reflexes: 2+ and symmetric in the biceps and patellae. FNF and HKS unable on right upper and lower due to weakness. Gait - Deferred  I have reviewed labs in epic and the pertinent results are: Trop I: 21  I have reviewed the images obtained: Reviewed  MRI T spine 2018 progression of thoracic spinal cord demyelinating disease since August 2017, with confluence of previously more distinct plaques from the T3 through T5, T7, and also T9-T10 levels. MRI C spine 2018 cervical spine demyelinating plaques are stable in comparison with the prior cervical MRI.  MRI brain 01/2020 multiple hyperintense foci in bilateral hemispheres and brainstem consistent with chronic demyelinating.   Assessment: Crystal Park is a 61 y.o. female PMHx MS debilitating chronic fatigue and chronic generalized weakness with worsening weakness of right upper and lower extremities.   Her generalized weakness over her baseline exacerbated in large part by decreased oral intake her.  History regarding worsening focal weakness is not very clear because she stated needing assistance lifting her left leg into the car and her husband mentioned having to lift her right leg into the car. There has not been any recent imaging and last MRI studies of C and T spines were in 2018 and the T spine was without contrast and showed progression and we will pursue imaging to evaluate further. Given the lack of clarity in onset/chronicity of focal weakness will await imaging study result prior to deciding on treatment.   Plan: - MRI neuraxis without and with contrast. - Continue alimentation and hydration. - Continue correcting metabolic derangements. - Recommend PT/OT consult. - Neurology will continue to follow.  Electronically signed by:  Marisue Humble, MD Page: [phone] 03/05/2021, 4:19 PM

## 2021-03-05 NOTE — ED Notes (Signed)
Patient transported to MRI 

## 2021-03-05 NOTE — Progress Notes (Signed)
°  Progress Note   Patient: Crystal Park [id number] DOB: 24-Aug-1960 DOA: 03/03/2021     0 DOS: the patient was seen and examined on 03/05/2021   Brief hospital course: No notes on file  Assessment and Plan * AKI (acute kidney injury) ([place])- (present on admission) -Patient with several days of n/v and decreased PO, 2 episodes of diarrhea -Negative CT A/P, still has abdominal tenderness which is diffuse. -Suspect viral gastroenteritis, now improved -However, she is dehydrated with AKI and metabolic acidosis as a result Continue with IV hydration, continue symptomatic control.  Neurogenic bladder- (present on admission) -Patient self-caths q4h prn at home -Will order standing self-caths q4h -UA appears clean at this time  Generalized weakness -Resulting from dehydration, n/v -Anticipate improvement with rehydration -However, she is debilitated at baseline due to [place] and so recovery may be delayed -She does PT at [facility]  MS (multiple sclerosis) ([place])- (present on admission) -Significant debility at baseline -This may be a flare but husband reports that this is not c/w usual flares Discussed with neurology and appreciate their consultation.  MRI brain and C-spine and T-spine negative for any acute demyelination syndrome.  Will monitor neuro recommendation.  Continue twice a day NIF monitoring. -Continue dimethyl fumarate     Subjective: No nausea no vomiting.  Still has weakness in the right side as well as numbness.  No fever no chills.  Continues to have minimal oral intake as well as abdominal pain.  No diarrhea.  Objective Vital signs were reviewed and unremarkable. General: Appear in mild distress, no Rash; Oral Mucosa Clear, moist. no Abnormal Neck Mass Or lumps, Conjunctiva normal  Cardiovascular: S1 and S2 Present, no Murmur, Respiratory: good respiratory effort, Bilateral Air entry present and CTA, no Crackles, no wheezes Abdomen: Bowel Sound  present, Soft and diffuse tenderness Extremities: no Pedal edema Neurology: alert and oriented to time, place, and person affect appropriate.  Right upper and lower extremity numbness and weakness, horizontal nystagmus Gait not checked due to patient safety concerns   Data Reviewed: My review of labs, imaging, notes and other tests shows no new significant findings.   Family Communication: Husband at bedside.  Disposition: Status is: Inpatient  Remains inpatient appropriate because: Ongoing weakness, may require further work-up and therapy for weakness as well as abdominal pain.  Has multiple sclerosis and high risk of poor outcome.     Time spent: 35 minutes  Author: Berle Mull 03/05/2021 7:45 PM  For on call review [email or web address].

## 2021-03-05 NOTE — ED Notes (Signed)
ED TO INPATIENT HANDOFF REPORT  ED Nurse Name and Phone #: Rosemary Mossbarger, 40  S Name/Age/Gender [place] 61 y.o. female Room/Bed: 002C/002C  Code Status   Code Status: Full Code  Home/SNF/Other Home Patient oriented to: self, place, time, and situation Is this baseline? Yes   Triage Complete: Triage complete  Chief Complaint Dehydration [E86.0] Multiple sclerosis (HCC) [G35]  Triage Note Pt c/o abdominal pain and generalized weakness. Has had NV. Lower Abdominal pain onset x 3 days, self caths at home, last time earlier this afternoon. Has a history of MS and noted decreased ability to ambulate, usually walks with cane/walkers. Today pt reports being too weak to ambulate. Husband with pt.    Allergies Allergies  Allergen Reactions   Adderall [Amphetamine-Dextroamphetamine] Other (See Comments)    constipation    Level of Care/Admitting Diagnosis ED Disposition     ED Disposition  Admit   Condition  --   Comment  Hospital Area: [facility] [[id number]]  Level of Care: Telemetry Medical [104]  May admit patient to [facility] or [facility] if equivalent level of care is available:: No  Covid Evaluation: Asymptomatic Screening Protocol (No Symptoms)  Diagnosis: Multiple sclerosis (HCC) [340.ICD-9-CM]  Admitting Physician: Rolly Salter [[id number]]  Attending Physician: Rolly Salter [[id number]]  Estimated length of stay: past midnight tomorrow  Certification:: I certify this patient will need inpatient services for at least 2 midnights          B Medical/Surgery History Past Medical History:  Diagnosis Date   Benign paroxysmal positional vertigo 10/12/2014   Constipation    Family history of breast cancer    Headache    history of migaines   History of kidney stones    History of MRSA infection    History of normocytic normochromic anemia    MS (multiple sclerosis) (HCC)    diagnosed 9/16   Neurogenic bladder    self-caths   PONV  (postoperative nausea and vomiting)    Past Surgical History:  Procedure Laterality Date   BREAST LUMPECTOMY Right    benign   COLONOSCOPY     CYSTOSCOPY WITH INJECTION N/A 04/23/2017   Procedure: CYSTOSCOPY WITH INJECTION/ BOTOX 200 UNITS;  Surgeon: Jerilee Field, MD;  Location: [facility];  Service: Urology;  Laterality: N/A;   OOPHORECTOMY Left    2013   TONSILLECTOMY AND ADENOIDECTOMY     age 64   TRANSURETHRAL RESECTION OF BLADDER TUMOR N/A 04/23/2017   Procedure: TRANSURETHRAL RESECTION OF BLADDER TUMOR (TURBT);  Surgeon: Jerilee Field, MD;  Location: [facility];  Service: Urology;  Laterality: N/A;     A IV Location/Drains/Wounds Patient Lines/Drains/Airways Status     Active Line/Drains/Airways     Name Placement date Placement time Site Days   Peripheral IV 03/04/21 20 G 1" Right Antecubital 03/04/21  0907  Antecubital  1   Peripheral IV 03/04/21 20 G Left Antecubital 03/04/21  1224  Antecubital  1            Intake/Output Last 24 hours  Intake/Output Summary (Last 24 hours) at 03/05/2021 1449 Last data filed at 03/05/2021 1134 Gross per 24 hour  Intake --  Output 500 ml  Net -500 ml    Labs/Imaging Results for orders placed or performed during the hospital encounter of 03/03/21 (from the past 48 hour(s))  Urinalysis, Routine w reflex microscopic Urine, Clean Catch     Status: Abnormal   Collection Time: 03/03/21 11:05  PM  Result Value Ref Range   Color, Urine YELLOW YELLOW   APPearance CLEAR CLEAR   Specific Gravity, Urine 1.023 1.005 - 1.030   pH 6.0 5.0 - 8.0   Glucose, UA NEGATIVE NEGATIVE mg/dL   Hgb urine dipstick MODERATE (A) NEGATIVE   Bilirubin Urine NEGATIVE NEGATIVE   Ketones, ur 80 (A) NEGATIVE mg/dL   Protein, ur >=917 (A) NEGATIVE mg/dL   Nitrite NEGATIVE NEGATIVE   Leukocytes,Ua SMALL (A) NEGATIVE   RBC / HPF 0-5 0 - 5 RBC/hpf   WBC, UA 0-5 0 - 5 WBC/hpf   Bacteria, UA NONE SEEN NONE SEEN   Squamous  Epithelial / LPF 0-5 0 - 5   Mucus PRESENT    Hyaline Casts, UA PRESENT     Comment: Performed at [facility], [address] [place], [place] [address]  Lactic acid, plasma     Status: None   Collection Time: 03/03/21 11:28 PM  Result Value Ref Range   Lactic Acid, Venous 1.4 0.5 - 1.9 mmol/L    Comment: Performed at [facility], [address] [place], [place] [address]  Comprehensive metabolic panel     Status: Abnormal   Collection Time: 03/03/21 11:28 PM  Result Value Ref Range   Sodium 136 135 - 145 mmol/L   Potassium 4.5 3.5 - 5.1 mmol/L   Chloride 106 98 - 111 mmol/L   CO2 12 (L) 22 - 32 mmol/L   Glucose, Bld 126 (H) 70 - 99 mg/dL    Comment: Glucose reference range applies only to samples taken after fasting for at least 8 hours.   BUN 11 6 - 20 mg/dL   Creatinine, Ser 8.01 (H) 0.44 - 1.00 mg/dL   Calcium 9.7 8.9 - 65.5 mg/dL   Total Protein 8.4 (H) 6.5 - 8.1 g/dL   Albumin 4.8 3.5 - 5.0 g/dL   AST 18 15 - 41 U/L   ALT 22 0 - 44 U/L   Alkaline Phosphatase 78 38 - 126 U/L   Total Bilirubin 1.2 0.3 - 1.2 mg/dL   GFR, Estimated 55 (L) >60 mL/min    Comment: (NOTE) Calculated using the CKD-EPI Creatinine Equation (2021)    Anion gap 18 (H) 5 - 15    Comment: Performed at [facility], [address], [place], [place] [address]  CBC with Differential     Status: Abnormal   Collection Time: 03/03/21 11:28 PM  Result Value Ref Range   WBC 16.4 (H) 4.0 - 10.5 K/uL   RBC 5.34 (H) 3.87 - 5.11 MIL/uL   Hemoglobin 17.1 (H) 12.0 - 15.0 g/dL   HCT 70.7 (H) 86.7 - 54.4 %   MCV 98.3 80.0 - 100.0 fL   MCH 32.0 26.0 - 34.0 pg   MCHC 32.6 30.0 - 36.0 g/dL   RDW 92.0 10.0 - 71.2 %   Platelets 431 (H) 150 - 400 K/uL   nRBC 0.0 0.0 - 0.2 %   Neutrophils Relative % 87 %   Neutro Abs 14.2 (H) 1.7 - 7.7 K/uL   Lymphocytes Relative 5 %   Lymphs Abs 0.8 0.7 - 4.0 K/uL   Monocytes Relative 7 %   Monocytes Absolute 1.2 (H) 0.1 - 1.0 K/uL   Eosinophils Relative 0  %   Eosinophils Absolute 0.0 0.0 - 0.5 K/uL   Basophils Relative 0 %   Basophils Absolute 0.0 0.0 - 0.1 K/uL   Immature Granulocytes 1 %  Abs Immature Granulocytes 0.20 (H) 0.00 - 0.07 K/uL    Comment: Performed at [facility], [address], [place], [place] [address]  Urinalysis, Routine w reflex microscopic Urine, Catheterized     Status: Abnormal   Collection Time: 03/04/21  9:25 AM  Result Value Ref Range   Color, Urine YELLOW YELLOW   APPearance HAZY (A) CLEAR   Specific Gravity, Urine 1.022 1.005 - 1.030   pH 5.0 5.0 - 8.0   Glucose, UA NEGATIVE NEGATIVE mg/dL   Hgb urine dipstick MODERATE (A) NEGATIVE   Bilirubin Urine NEGATIVE NEGATIVE   Ketones, ur 80 (A) NEGATIVE mg/dL   Protein, ur >=604 (A) NEGATIVE mg/dL   Nitrite NEGATIVE NEGATIVE   Leukocytes,Ua NEGATIVE NEGATIVE   RBC / HPF 0-5 0 - 5 RBC/hpf   WBC, UA 0-5 0 - 5 WBC/hpf   Bacteria, UA RARE (A) NONE SEEN   Squamous Epithelial / LPF 0-5 0 - 5   Mucus PRESENT    Hyaline Casts, UA PRESENT     Comment: Performed at [facility], [address], [place], [place] [address]  Resp Panel by RT-PCR (Flu A&B, Covid) Nasopharyngeal Swab     Status: None   Collection Time: 03/04/21  9:40 AM   Specimen: Nasopharyngeal Swab; Nasopharyngeal(NP) swabs in vial transport medium  Result Value Ref Range   SARS Coronavirus 2 by RT PCR NEGATIVE NEGATIVE    Comment: (NOTE) SARS-CoV-2 target nucleic acids are NOT DETECTED.  The SARS-CoV-2 RNA is generally detectable in upper respiratory specimens during the acute phase of infection. The lowest concentration of SARS-CoV-2 viral copies this assay can detect is 138 copies/mL. A negative result does not preclude SARS-Cov-2 infection and should not be used as the sole basis for treatment or other patient management decisions. A negative result may occur with  improper specimen collection/handling, submission of specimen other than nasopharyngeal swab, presence of viral  mutation(s) within the areas targeted by this assay, and inadequate number of viral copies(<138 copies/mL). A negative result must be combined with clinical observations, patient history, and epidemiological information. The expected result is Negative.  Fact Sheet for Patients:  BloggerCourse.com  Fact Sheet for Healthcare Providers:  SeriousBroker.it  This test is no t yet approved or cleared by the [place] FDA and  has been authorized for detection and/or diagnosis of SARS-CoV-2 by FDA under an Emergency Use Authorization (EUA). This EUA will remain  in effect (meaning this test can be used) for the duration of the COVID-19 declaration under Section 564(b)(1) of the Act, 21 U.S.C.section 360bbb-3(b)(1), unless the authorization is terminated  or revoked sooner.       Influenza A by PCR NEGATIVE NEGATIVE   Influenza B by PCR NEGATIVE NEGATIVE    Comment: (NOTE) The Xpert Xpress SARS-CoV-2/FLU/RSV plus assay is intended as an aid in the diagnosis of influenza from Nasopharyngeal swab specimens and should not be used as a sole basis for treatment. Nasal washings and aspirates are unacceptable for Xpert Xpress SARS-CoV-2/FLU/RSV testing.  Fact Sheet for Patients: BloggerCourse.com  Fact Sheet for Healthcare Providers: SeriousBroker.it  This test is not yet approved or cleared by the [place] FDA and has been authorized for detection and/or diagnosis of SARS-CoV-2 by FDA under an Emergency Use Authorization (EUA). This EUA will remain in effect (meaning this test can be used) for the duration of the COVID-19 declaration under Section 564(b)(1) of the Act, 21 U.S.C. section 360bbb-3(b)(1), unless the authorization is terminated or  revoked.  Performed at [facility], [address], [place], [place] [address]   Troponin I (High Sensitivity)     Status:  Abnormal   Collection Time: 03/04/21  1:33 PM  Result Value Ref Range   Troponin I (High Sensitivity) 21 (H) <18 ng/L    Comment: (NOTE) Elevated high sensitivity troponin I (hsTnI) values and significant  changes across serial measurements may suggest ACS but many other  chronic and acute conditions are known to elevate hsTnI results.  Refer to the "Links" section for chest pain algorithms and additional  guidance. Performed at [facility], [address] [place], [place] [address]   HIV Antibody (routine testing w rflx)     Status: None   Collection Time: 03/04/21  1:33 PM  Result Value Ref Range   HIV Screen 4th Generation wRfx Non Reactive Non Reactive    Comment: Performed at [facility], [address] [place], [place] [address]  Basic metabolic panel     Status: Abnormal   Collection Time: 03/05/21  7:53 AM  Result Value Ref Range   Sodium 141 135 - 145 mmol/L   Potassium 4.2 3.5 - 5.1 mmol/L   Chloride 111 98 - 111 mmol/L   CO2 18 (L) 22 - 32 mmol/L   Glucose, Bld 101 (H) 70 - 99 mg/dL    Comment: Glucose reference range applies only to samples taken after fasting for at least 8 hours.   BUN 8 6 - 20 mg/dL   Creatinine, Ser 6.73 0.44 - 1.00 mg/dL   Calcium 8.8 (L) 8.9 - 10.3 mg/dL   GFR, Estimated >41 >93 mL/min    Comment: (NOTE) Calculated using the CKD-EPI Creatinine Equation (2021)    Anion gap 12 5 - 15    Comment: Performed at [facility], [address], [place], [place] [address]  CBC     Status: Abnormal   Collection Time: 03/05/21  7:53 AM  Result Value Ref Range   WBC 11.9 (H) 4.0 - 10.5 K/uL   RBC 4.18 3.87 - 5.11 MIL/uL   Hemoglobin 13.2 12.0 - 15.0 g/dL   HCT 09.7 35.3 - 29.9 %   MCV 96.4 80.0 - 100.0 fL   MCH 31.6 26.0 - 34.0 pg   MCHC 32.8 30.0 - 36.0 g/dL   RDW 24.2 68.3 - 41.9 %   Platelets 329 150 - 400 K/uL   nRBC 0.0 0.0 - 0.2 %    Comment: Performed at [facility], [address], [place], [place] [address]    CT HEAD WO CONTRAST ( )  Result Date: 03/04/2021 CLINICAL DATA:  Altered mental status. EXAM: CT HEAD WITHOUT CONTRAST TECHNIQUE: Contiguous axial images were obtained from the base of the skull through the vertex without intravenous contrast. COMPARISON:  December 12, 2008. FINDINGS: Brain: Mild chronic ischemic white matter disease is noted. Old left cerebellar infarction is noted. No mass effect or midline shift is noted. Ventricular size is within normal limits. There is no evidence of mass lesion, hemorrhage or acute infarction. Vascular: No hyperdense vessel or unexpected calcification. Skull: Normal. Negative for fracture or focal lesion. Sinuses/Orbits: No acute finding. Other: None. IMPRESSION: No acute intracranial abnormality seen. Electronically Signed   By: Lupita Raider M.D.   On: 03/04/2021 12:55   CT Abdomen Pelvis W Contrast  Result Date: 03/04/2021 CLINICAL DATA:  Nausea, vomiting and lower abdomen pain for 3 weeks. EXAM: CT ABDOMEN AND  PELVIS WITH CONTRAST TECHNIQUE: Multidetector CT imaging of the abdomen and pelvis was performed using the standard protocol following bolus administration of intravenous contrast. CONTRAST:  OMNIPAQUE IOHEXOL 350 MG/ML SOLN COMPARISON:  April 20, 2016 FINDINGS: Lower chest: Minimal dependent atelectasis of posterior lung bases are noted. The heart size normal. Hepatobiliary: No focal liver abnormality is seen. No gallstones, gallbladder wall thickening, or biliary dilatation. Pancreas: Unremarkable. No pancreatic ductal dilatation or surrounding inflammatory changes. Spleen: Normal in size without focal abnormality. Adrenals/Urinary Tract: Adrenal glands are unremarkable. Kidneys are normal, without renal calculi, focal lesion, or hydronephrosis. Bladder is unremarkable. Stomach/Bowel: Stomach is within normal limits. There is a minimal hiatal hernia. Appendix appears normal. No evidence of bowel wall thickening, distention, or inflammatory  changes. Vascular/Lymphatic: Aortic atherosclerosis. No enlarged abdominal or pelvic lymph nodes. Reproductive: Uterus and bilateral adnexa are unremarkable. Probable uterine fibroid unchanged. Other: None Musculoskeletal: Stable probable calcified herniated disc at T10-11 is unchanged. IMPRESSION: 1. No acute abnormality identified in the abdomen and pelvis. 2. Aortic atherosclerosis. Aortic Atherosclerosis (ICD10-I70.0). Electronically Signed   By: Sherian Rein M.D.   On: 03/04/2021 12:59    Pending Labs Unresulted Labs (From admission, onward)     Start     Ordered   03/05/21 1112  CK  Add-on,   AD        03/05/21 1111            Vitals/Pain Today's Vitals   03/05/21 0945 03/05/21 1000 03/05/21 1015 03/05/21 1030  BP: (!) 145/88 (!) 142/90 139/86 (!) 149/81  Pulse: (!) 102 (!) 110 100 97  Resp: Temp:      TempSrc:      SpO2: 98% 96% 97% 99%  PainSc:        Isolation Precautions No active isolations  Medications Medications  Dimethyl Fumarate CPDR 240 mg (240 mg Oral Given 03/05/21 0942)  levothyroxine (SYNTHROID) tablet 88 mcg (88 mcg Oral Given 03/05/21 0940)  oxybutynin (DITROPAN) tablet 5 mg (5 mg Oral Given 03/05/21 0939)  enoxaparin (LOVENOX) injection 40 mg (40 mg Subcutaneous Given 03/04/21 2239)  sodium chloride flush (NS) 0.9 % injection 3 mL (3 mLs Intravenous Given 03/05/21 0942)  acetaminophen (TYLENOL) tablet 650 mg (650 mg Oral Given 03/04/21 2239)    Or  acetaminophen (TYLENOL) suppository 650 mg ( Rectal See Alternative 03/04/21 2239)  morphine 2 MG/ML injection 2 mg (has no administration in time range)  ondansetron (ZOFRAN) tablet 4 mg (4 mg Oral Given 03/05/21 0939)    Or  ondansetron (ZOFRAN) injection 4 mg ( Intravenous See Alternative 03/05/21 0939)  hydrALAZINE (APRESOLINE) injection 5 mg (has no administration in time range)  oxyCODONE (Oxy IR/ROXICODONE) immediate release tablet 5 mg (has no administration in time range)  0.9 %  sodium  chloride infusion ( Intravenous New Bag/Given 03/05/21 1146)  sodium chloride 0.9 % bolus 1,000 mL ( Intravenous Stopped 03/04/21 1013)  ondansetron (ZOFRAN) injection 4 mg (4 mg Intravenous Given 03/04/21 0936)  acetaminophen (TYLENOL) tablet 1,000 mg (1,000 mg Oral Given 03/04/21 0938)  lactated ringers bolus 1,000 mL ( Intravenous Stopped 03/04/21 1311)  iohexol (OMNIPAQUE) 350 MG/ML injection 100 mL (100 mLs Intravenous Contrast Given 03/04/21 1232)    Mobility non-ambulatory Low fall risk   Focused Assessments    R Recommendations: See Admitting Provider Note  Report given to:   Additional Notes: pt in and out caths by self at home, too weak to do it the past few days,  RN have in and out twice today

## 2021-03-05 NOTE — Evaluation (Signed)
Occupational Therapy Evaluation [name] MRN: [id number] DOB: [birth year] Today's Date: 03/05/2021   History of Present Illness Crystal Park is a 61 y.o. female  presenting with n/v and abdominal pain. Pt with medical history significant of MS   Clinical Impression   Crystal Park reports being mod I PTA with use of DME. She lives ina  1 level home, 1 STE with her husband who is able to assist as needed. Upon evaluation pt was extremely lethargic with report of nausea and general fatigue, her husband provided most of pt's history but pt followed all commands and answered direct questions appropriately given increased time. Overall she is requiring max A+2 for bed mobility and min-mod A for sitting balance. She is also required up to max A+2 for ADLs at bed level. Unable to safely attempt OOB transfer at the time of eval. Pt will benefit from OT acutely. Recommend multi-discipline therapy approach at the CIR level to progress towards pr's mod I baseline prior to d/c home with support of her husband.       Recommendations for follow up therapy are one component of a multi-disciplinary discharge planning process, led by the attending physician.  Recommendations may be updated based on patient status, additional functional criteria and insurance authorization.   Follow Up Recommendations  Acute inpatient rehab (3hours/day)    Assistance Recommended at Discharge Frequent or constant Supervision/Assistance  Functional Status Assessment  Patient has had a recent decline in their functional status and demonstrates the ability to make significant improvements in function in a reasonable and predictable amount of time.  Equipment Recommendations  None recommended by OT (pt well equipped)    Recommendations for Other Services Rehab consult     Precautions / Restrictions Precautions Precautions: Fall Restrictions Weight Bearing Restrictions: No      Mobility Bed  Mobility Overal bed mobility: Needs Assistance Bed Mobility: Rolling;Sidelying to Sit;Sit to Supine Rolling: Max assist;+2 for physical assistance Sidelying to sit: Max assist;+2 for physical assistance   Sit to supine: Max assist;+2 for physical assistance   General bed mobility comments: assist for all aspects of the task. benefits from verbal cues.    Transfers Overall transfer level: Needs assistance                 General transfer comment: defer for safety      Balance Overall balance assessment: Needs assistance Sitting-balance support: Feet supported Sitting balance-Leahy Scale: Poor Sitting balance - Comments: required min-mod A for sitting balance, unable to self correct multiple LOB                                   ADL either performed or assessed with clinical judgement   ADL Overall ADL's : Needs assistance/impaired Eating/Feeding: Minimal assistance;Bed level   Grooming: Moderate assistance;Bed level   Upper Body Bathing: Moderate assistance;Bed level   Lower Body Bathing: Maximal assistance;+2 for physical assistance;Bed level   Upper Body Dressing : Moderate assistance;Sitting   Lower Body Dressing: Maximal assistance;+2 for physical assistance;Bed level   Toilet Transfer: Maximal assistance;+2 for physical assistance   Toileting- Clothing Manipulation and Hygiene: Maximal assistance;Bed level Toileting - Clothing Manipulation Details (indicate cue type and reason): in/out cath at baseline     Functional mobility during ADLs: Maximal assistance;+2 for physical assistance;+2 for safety/equipment (limited to bed level only) General ADL Comments: ADL limited to bed level at this time  for safety. Pt limited by lethargy, fatigue, activity tolerance BLE weakness and poor sitting balance     Vision Baseline Vision/History: 0 No visual deficits Ability to See in Adequate Light: 0 Adequate Vision Assessment?: No apparent visual  deficits     Perception     Praxis      Pertinent Vitals/Pain Pain Assessment: Faces Faces Pain Scale: Hurts a little bit Pain Location: generalized wtih movement Pain Descriptors / Indicators: Grimacing Pain Intervention(s): Monitored during session;Limited activity within patient's tolerance     Hand Dominance     Extremity/Trunk Assessment Upper Extremity Assessment Upper Extremity Assessment: Generalized weakness (AROM is WFL, strength is 3+/5 globally throughout BUE. Pt extremely fatigued and lethargic.)   Lower Extremity Assessment Lower Extremity Assessment: Defer to PT evaluation   Cervical / Trunk Assessment Cervical / Trunk Assessment: Normal (forward head posture this session, likely due to lethargy)   Communication Communication Communication: No difficulties   Cognition Arousal/Alertness: Lethargic Behavior During Therapy: Flat affect Overall Cognitive Status: Within Functional Limits for tasks assessed                                 General Comments: cog was [facility] for tasks assessed however - pt extremely lethargic with eyes closed much of the session. She responded to all questions directly asked, husband speaking most of the session. Pt's husband stated that she seems like she is "out of it" or "drugged" adn now at her baseline personality.     General Comments  VSS on RA, husband states her BP is higher than usual    Exercises     Shoulder Instructions      Home Living Family/patient expects to be discharged to:: Private residence Living Arrangements: Spouse/significant other Available Help at Discharge: Family;Available PRN/intermittently Type of Home: House Home Access: Stairs to enter [employer] of Steps: 1 STE from garage, no steps in the front   Home Layout: One level     Bathroom Shower/Tub: Occupational psychologist: Handicapped height     Home Equipment: Grab bars - tub/shower;Shower Electrical engineer (2 wheels);Standard Walker;Rollator (4 wheels);Cane - quad   Additional Comments: was using QC or rollator before admission      Prior Functioning/Environment Prior Level of Function : Independent/Modified Independent             Mobility Comments: using DME was able to get anywhere- house hold distances; not driving; had one fall recently on past tuesday, slid off a chair ADLs Comments: dressing/bathing- was independent        OT Problem List: Decreased strength;Decreased range of motion;Decreased activity tolerance;Impaired balance (sitting and/or standing);Decreased safety awareness;Decreased knowledge of use of DME or AE;Decreased knowledge of precautions;Pain      OT Treatment/Interventions: Self-care/ADL training;Therapeutic exercise;Patient/family education;Balance training;Therapeutic activities;DME and/or AE instruction    OT Goals(Current goals can be found in the care plan section) Acute Rehab OT Goals Patient Stated Goal: back to indep OT Goal Formulation: With patient Time For Goal Achievement: 03/19/21 Potential to Achieve Goals: Good ADL Goals Pt Will Perform Grooming: with supervision;standing Pt Will Perform Lower Body Bathing: with supervision;sit to/from stand Pt Will Perform Lower Body Dressing: with supervision;sit to/from stand Pt Will Transfer to Toilet: with supervision;ambulating Pt/caregiver will Perform Home Exercise Program: Increased strength;Both right and left upper extremity;With written HEP provided  OT Frequency: Min 2X/week   Barriers to D/C:  Co-evaluation PT/OT/SLP Co-Evaluation/Treatment: Yes Reason for Co-Treatment: Complexity of the patient's impairments (multi-system involvement);For patient/therapist safety;To address functional/ADL transfers   OT goals addressed during session: ADL's and self-care;Proper use of Adaptive equipment and DME      AM-PAC OT "6 Clicks" Daily Activity     Outcome Measure Help from  another person eating meals?: A Little Help from another person taking care of personal grooming?: A Lot Help from another person toileting, which includes using toliet, bedpan, or urinal?: A Lot Help from another person bathing (including washing, rinsing, drying)?: A Lot Help from another person to put on and taking off regular upper body clothing?: A Lot Help from another person to put on and taking off regular lower body clothing?: A Lot 6 Click Score: 13   End of Session Nurse Communication: Mobility status (pt needs to be cath'ed)  Activity Tolerance: Patient limited by lethargy;Patient limited by fatigue Patient left: in bed;with call bell/phone within reach;with family/visitor present  OT Visit Diagnosis: Unsteadiness on feet (R26.81);Other abnormalities of gait and mobility (R26.89);Muscle weakness (generalized) (M62.81);Pain                Time: [id number] OT Time Calculation (min): 19 min Charges:  OT General Charges $OT Visit: 1 Visit OT Evaluation $OT Eval Moderate Complexity: 1 Mod  Crystal Park 03/05/2021, 12:17 PM

## 2021-03-05 NOTE — Evaluation (Signed)
Physical Therapy Evaluation [name] MRN: [id number] DOB: 1961/03/02 Today's Date: 03/05/2021  History of Present Illness  61yo female who presented on 12/31 with n/v and abdominal pain, also too weak to stand and walk. Found to have AKI. PMH BPPV, MS, neurogenic bladder  Clinical Impression   Received in bed, lethargic but cooperative. We were able to get to EOB with heavy physical +2 assist, did not try transfers or gait today due to lethargy. Had multidirectional balance loss at EOB along with limited ability to actively correct it. Interestingly, also had increased weakness RLE as compared to the left, also with reduced RLE sensation as well. Difficult to perform thorough neuro screen due to lethargy. Left on ED stretcher positioned to comfort with all needs met, spouse present. Would benefit from intensive therapies in AIR setting prior to DC home.        Recommendations for follow up therapy are one component of a multi-disciplinary discharge planning process, led by the attending physician.  Recommendations may be updated based on patient status, additional functional criteria and insurance authorization.  Follow Up Recommendations Acute inpatient rehab (3hours/day)    Assistance Recommended at Discharge Frequent or constant Supervision/Assistance  Functional Status Assessment Patient has had a recent decline in their functional status and demonstrates the ability to make significant improvements in function in a reasonable and predictable amount of time.  Equipment Recommendations  Rolling walker (2 wheels);BSC/3in1;Wheelchair (measurements PT);Wheelchair cushion (measurements PT)    Recommendations for Other Services       Precautions / Restrictions Precautions Precautions: Fall Restrictions Weight Bearing Restrictions: No      Mobility  Bed Mobility Overal bed mobility: Needs Assistance Bed Mobility: Rolling;Sidelying to Sit;Sit to  Supine Rolling: Max assist;+2 for physical assistance Sidelying to sit: Max assist;+2 for physical assistance   Sit to supine: Max assist;+2 for physical assistance   General bed mobility comments: assist for all aspects of the task. benefits from verbal cues.    Transfers Overall transfer level: Needs assistance                 General transfer comment: defer for safety    Ambulation/Gait               General Gait Details: deferred- safety/lethargy  Stairs            Wheelchair Mobility    Modified Rankin (Stroke Patients Only)       Balance Overall balance assessment: Needs assistance Sitting-balance support: Feet supported Sitting balance-Leahy Scale: Poor Sitting balance - Comments: required min-mod A for sitting balance, unable to self correct multiple LOB                                     Pertinent Vitals/Pain Pain Assessment: Faces Faces Pain Scale: Hurts a little bit Pain Location: generalized wtih movement Pain Descriptors / Indicators: Grimacing Pain Intervention(s): Limited activity within patient's tolerance;Monitored during session    Home Living Family/patient expects to be discharged to:: Private residence Living Arrangements: Spouse/significant other Available Help at Discharge: Family;Available PRN/intermittently Type of Home: House Home Access: Stairs to enter Entrance Stairs-Rails: Can reach both Entrance Stairs-Number of Steps: 1 STE from garage, no steps in the front   Home Layout: One level Home Equipment: Grab bars - tub/shower;Shower [place] (2 wheels);Standard Walker;Rollator (4 wheels);Cane - quad Additional Comments: was using QC or rollator before admission  Prior Function Prior Level of Function : Independent/Modified Independent             Mobility Comments: using DME was able to get anywhere- house hold distances; not driving; had one fall recently on past tuesday, slid off  a chair ADLs Comments: dressing/bathing- was independent     Hand Dominance        Extremity/Trunk Assessment   Upper Extremity Assessment Upper Extremity Assessment: Defer to OT evaluation    Lower Extremity Assessment Lower Extremity Assessment: Generalized weakness (unable to move R LE much, but very lethargic and not at baseline cognition)    Cervical / Trunk Assessment Cervical / Trunk Assessment: Normal (forward head posture likely due to lethargy)  Communication   Communication: No difficulties  Cognition Arousal/Alertness: Lethargic Behavior During Therapy: Flat affect Overall Cognitive Status: Within Functional Limits for tasks assessed                                 General Comments: cog was [facility] for tasks assessed however - pt extremely lethargic with eyes closed much of the session. She responded to all questions directly asked, husband speaking most of the session. Pt's husband stated that she seems like she is "out of it" or "drugged" adn now at her baseline personality.        General Comments General comments (skin integrity, edema, etc.): VSS on RA, husband states her BP is higher than usual    Exercises     Assessment/Plan    PT Assessment Patient needs continued PT services  PT Problem List Decreased strength;Decreased knowledge of use of DME;Decreased activity tolerance;Decreased safety awareness;Decreased balance;Decreased mobility;Decreased coordination;Impaired sensation       PT Treatment Interventions DME instruction;Balance training;Gait training;Neuromuscular re-education;Stair training;Functional mobility training;Patient/family education;Therapeutic activities;Therapeutic exercise    PT Goals (Current goals can be found in the Care Plan section)  Acute Rehab PT Goals Patient Stated Goal: go to rehab prior to home PT Goal Formulation: With patient/family Time For Goal Achievement: 03/19/21 Potential to Achieve Goals:  Good    Frequency Min 4X/week   Barriers to discharge        Co-evaluation   Reason for Co-Treatment: Complexity of the patient's impairments (multi-system involvement);For patient/therapist safety;To address functional/ADL transfers   OT goals addressed during session: ADL's and self-care;Proper use of Adaptive equipment and DME       AM-PAC PT "6 Clicks" Mobility  Outcome Measure Help needed turning from your back to your side while in a flat bed without using bedrails?: A Lot Help needed moving from lying on your back to sitting on the side of a flat bed without using bedrails?: Total Help needed moving to and from a bed to a chair (including a wheelchair)?: Total Help needed standing up from a chair using your arms (e.g., wheelchair or bedside chair)?: Total Help needed to walk in hospital room?: Total Help needed climbing 3-5 steps with a railing? : Total 6 Click Score: 7    End of Session   Activity Tolerance: Patient limited by lethargy Patient left: in bed;with call bell/phone within reach;with family/visitor present Nurse Communication: Mobility status PT Visit Diagnosis: Unsteadiness on feet (R26.81);Muscle weakness (generalized) (M62.81);Difficulty in walking, not elsewhere classified (R26.2);History of falling (Z91.81);Other symptoms and signs involving the nervous system (R29.898)    Time: 7628-3151 PT Time Calculation (min) (ACUTE ONLY): 19 min   Charges:   PT Evaluation $PT Eval Moderate Complexity:  [place], DPT, PN2   Supplemental Physical Therapist [place]    Pager [phone] Acute Rehab Office [phone]

## 2021-03-06 DIAGNOSIS — N179 Acute kidney failure, unspecified: Secondary | ICD-10-CM | POA: Diagnosis not present

## 2021-03-06 MED ORDER — STERILE WATER FOR INJECTION IV SOLN
INTRAVENOUS | Status: DC
  Filled 2021-03-06 (×2): qty 1000

## 2021-03-06 MED ORDER — ADULT MULTIVITAMIN W/MINERALS CH
1.0000 | ORAL_TABLET | Freq: Every day | ORAL | Status: DC
  Administered 2021-03-06 – 2021-03-08 (×3): 1 via ORAL
  Filled 2021-03-06 (×2): qty 1

## 2021-03-06 MED ORDER — ENSURE ENLIVE PO LIQD
237.0000 mL | Freq: Two times a day (BID) | ORAL | Status: DC
  Administered 2021-03-06 – 2021-03-08 (×3): 237 mL via ORAL

## 2021-03-06 NOTE — Progress Notes (Signed)
Mobility Specialist Progress Note:   03/06/21 1420  Mobility  Activity Transferred:  Chair to bed  Level of Assistance Dependent, patient does less than 25% (+3)  Assistive Device Other (Comment) (pads under pt)  Mobility Response Tolerated well  Mobility performed by Mobility specialist;Nurse  $Mobility charge 1 Mobility   RN requesting assistance with transferring pt back to bed by sliding pt over with chuck pads. Pt tolerated well, left in bed with RN present.   Addison Lank Mobility Specialist  Phone [phone]

## 2021-03-06 NOTE — Progress Notes (Signed)
PROGRESS NOTE    Crystal Park  [id number] DOB: 10-22-1960 DOA: 03/03/2021 PCP: Lawerance Cruel, MD   Chief Complain: Nausea, weakness  Brief Narrative: Patient is a 61 year old female with history of multiple sclerosis who presented with nausea, vomiting, abdominal pain, weakness, inability to ambulate.  Reported loose stools at home, poor appetite, poor oral intake.  On presentation she was found to be dehydrated.  Lab work showed leukocytosis, mild AKI, anion gap metabolic acidosis with CO2 of 12.  She was admitted for further work-up.  Neurology consulted for suspicion of flare of multiple sclerosis.  MRI of the brain, cervical and thoracic spine did not show any evidence of acute demyelination and showed unchanged white matter disease.  PT/OT consulted and recommended CIR on discharge.  Hospital course remarkable for persistent weakness.TOC following.  Medically stable for discharge  Assessment & Plan:   Principal Problem:   AKI (acute kidney injury) ([place]) Active Problems:   MS (multiple sclerosis) ([place])   Generalized weakness   Neurogenic bladder   AKI: Likely secondary to decreased oral intake, nausea, vomiting and diarrhea.  Lab work showed normal anion gap metabolic acidosis with CO2 of 12 on presentation, likely from severe diarrhea..  Treated with IV fluids,fluid changed to bicarb drip.  Currently kidney function at baseline.  Nausea, vomiting, diarrhea: Most likely secondary to viral illness.  Symptoms have been better now.Diarrhoea stopped.  Continue supportive care.  CT abdomen/pelvis did not show any acute intra-abdominal abnormalities  Multiple sclerosis/generalized weakness: Generalized weakness is most likely secondary to dehydration, poor oral intake. She has history of multiple sclerosis.  Neurology was following.MRI of the brain, cervical and thoracic spine did not show any evidence of acute demyelination and showed unchanged white matter  disease  Neurogenic bladder/dysuria: Patient also has history of neurogenic bladder.  Does self-catheterization every 4 at home.  UA was not suspicious for UTI.  Continue oxybutynin. Complaints of dysuria.  Will check urine culture, holding on antibiotics for now.  Debility/deconditioning: Physical therapy recommending CIR on discharge.CIR here declined.  TOC  looking for acute rehab on outside facility           DVT prophylaxis:Lovenox Code Status: Full Family Communication: Husband at bedside Patient status:Inpatient  Dispo: The patient is from: Home              Anticipated d/c is to:CIR               Anticipated d/c date is: Not sure  Consultants: Neurology  Procedures: None  Antimicrobials:  Anti-infectives (From admission, onward)    None       Subjective:  Patient seen and examined at the bedside this morning.  Lying on bed, very weak.  Complains of nausea.  Diarrhea has stopped.   Objective: Vitals:   03/05/21 1714 03/05/21 2108 03/06/21 0005 03/06/21 0530  BP: 131/87 130/80 (!) 141/91 139/86  Pulse: (!) 108 (!) 110 96 99  Resp: 14 17 18 16   Temp: 97.8 F (36.6 C) 98.4 F (36.9 C) 98.4 F (36.9 C) 99.2 F (37.3 C)  TempSrc:  Oral Oral Oral  SpO2: 97% 99% 100% 97%    Intake/Output Summary (Last 24 hours) at 03/06/2021 0834 Last data filed at 03/06/2021 0600 Gross per 24 hour  Intake 1210 ml  Output 1100 ml  Net 110 ml   There were no vitals filed for this visit.  Examination:  General exam: Overall comfortable, not in distress, chronically ill looking, weak HEENT: PERRL Respiratory  system:  no wheezes or crackles  Cardiovascular system: S1 & S2 heard, RRR.  Gastrointestinal system: Abdomen is nondistended, soft and nontender. Central nervous system: Alert and oriented Extremities: No edema, no clubbing ,no cyanosis Skin: No rashes, no ulcers,no icterus      Data Reviewed: I have personally reviewed following labs and imaging  studies  CBC: Recent Labs  Lab 03/03/21 2328 03/05/21 0753  WBC 16.4* 11.9*  NEUTROABS 14.2*  --   HGB 17.1* 13.2  HCT 52.5* 40.3  MCV 98.3 96.4  PLT 431* [id number]   Basic Metabolic Panel: Recent Labs  Lab 03/03/21 2328 03/05/21 0753  NA 136 141  K 4.5 4.2  CL 106 111  CO2 12* 18*  GLUCOSE 126* 101*  BUN 11 8  CREATININE 1.16* 0.74  CALCIUM 9.7 8.8*   GFR: CrCl cannot be calculated (Unknown ideal weight.). Liver Function Tests: Recent Labs  Lab 03/03/21 2328  AST 18  ALT 22  ALKPHOS 78  BILITOT 1.2  PROT 8.4*  ALBUMIN 4.8   No results for input(s): LIPASE, AMYLASE in the last 168 hours. No results for input(s): AMMONIA in the last 168 hours. Coagulation Profile: No results for input(s): INR, PROTIME in the last 168 hours. Cardiac Enzymes: Recent Labs  Lab 03/05/21 1658  CKTOTAL 67   BNP (last 3 results) No results for input(s): PROBNP in the last 8760 hours. HbA1C: No results for input(s): HGBA1C in the last 72 hours. CBG: No results for input(s): GLUCAP in the last 168 hours. Lipid Profile: No results for input(s): CHOL, HDL, LDLCALC, TRIG, CHOLHDL, LDLDIRECT in the last 72 hours. Thyroid Function Tests: No results for input(s): TSH, T4TOTAL, FREET4, T3FREE, THYROIDAB in the last 72 hours. Anemia Panel: No results for input(s): VITAMINB12, FOLATE, FERRITIN, TIBC, IRON, RETICCTPCT in the last 72 hours. Sepsis Labs: Recent Labs  Lab 03/03/21 2328  LATICACIDVEN 1.4    Recent Results (from the past 240 hour(s))  Resp Panel by RT-PCR (Flu A&B, Covid) Nasopharyngeal Swab     Status: None   Collection Time: 03/04/21  9:40 AM   Specimen: Nasopharyngeal Swab; Nasopharyngeal(NP) swabs in vial transport medium  Result Value Ref Range Status   SARS Coronavirus 2 by RT PCR NEGATIVE NEGATIVE Final    Comment: (NOTE) SARS-CoV-2 target nucleic acids are NOT DETECTED.  The SARS-CoV-2 RNA is generally detectable in upper respiratory specimens during the acute  phase of infection. The lowest concentration of SARS-CoV-2 viral copies this assay can detect is 138 copies/mL. A negative result does not preclude SARS-Cov-2 infection and should not be used as the sole basis for treatment or other patient management decisions. A negative result may occur with  improper specimen collection/handling, submission of specimen other than nasopharyngeal swab, presence of viral mutation(s) within the areas targeted by this assay, and inadequate number of viral copies(<138 copies/mL). A negative result must be combined with clinical observations, patient history, and epidemiological information. The expected result is Negative.  Fact Sheet for Patients:  EntrepreneurPulse.com.au  Fact Sheet for Healthcare Providers:  IncredibleEmployment.be  This test is no t yet approved or cleared by the [place] FDA and  has been authorized for detection and/or diagnosis of SARS-CoV-2 by FDA under an Emergency Use Authorization (EUA). This EUA will remain  in effect (meaning this test can be used) for the duration of the COVID-19 declaration under Section 564(b)(1) of the Act, 21 U.S.C.section 360bbb-3(b)(1), unless the authorization is terminated  or revoked sooner.  Influenza A by PCR NEGATIVE NEGATIVE Final   Influenza B by PCR NEGATIVE NEGATIVE Final    Comment: (NOTE) The Xpert Xpress SARS-CoV-2/FLU/RSV plus assay is intended as an aid in the diagnosis of influenza from Nasopharyngeal swab specimens and should not be used as a sole basis for treatment. Nasal washings and aspirates are unacceptable for Xpert Xpress SARS-CoV-2/FLU/RSV testing.  Fact Sheet for Patients: EntrepreneurPulse.com.au  Fact Sheet for Healthcare Providers: IncredibleEmployment.be  This test is not yet approved or cleared by the [place] FDA and has been authorized for detection and/or diagnosis of  SARS-CoV-2 by FDA under an Emergency Use Authorization (EUA). This EUA will remain in effect (meaning this test can be used) for the duration of the COVID-19 declaration under Section 564(b)(1) of the Act, 21 U.S.C. section 360bbb-3(b)(1), unless the authorization is terminated or revoked.  Performed at [facility], [place] [address] [place], [place] [address]          Radiology Studies: CT HEAD WO CONTRAST (5MM)  Result Date: 03/04/2021 CLINICAL DATA:  Altered mental status. EXAM: CT HEAD WITHOUT CONTRAST TECHNIQUE: Contiguous axial images were obtained from the base of the skull through the vertex without intravenous contrast. COMPARISON:  December 12, 2008. FINDINGS: Brain: Mild chronic ischemic white matter disease is noted. Old left cerebellar infarction is noted. No mass effect or midline shift is noted. Ventricular size is within normal limits. There is no evidence of mass lesion, hemorrhage or acute infarction. Vascular: No hyperdense vessel or unexpected calcification. Skull: Normal. Negative for fracture or focal lesion. Sinuses/Orbits: No acute finding. Other: None. IMPRESSION: No acute intracranial abnormality seen. Electronically Signed   By: Marijo Conception M.D.   On: 03/04/2021 12:55   MR BRAIN W WO CONTRAST  Result Date: 03/05/2021 CLINICAL DATA:  Multiple sclerosis.  Leg weakness. EXAM: MRI HEAD WITHOUT AND WITH CONTRAST TECHNIQUE: Multiplanar, multiecho pulse sequences of the brain and surrounding structures were obtained without and with intravenous contrast. CONTRAST:  7mL GADAVIST GADOBUTROL 1 MMOL/ML IV SOLN COMPARISON:  Head CT 03/04/2021 and MRI 01/13/2020 FINDINGS: Brain: There is no evidence of an acute infarct, intracranial hemorrhage, mass, midline shift, or extra-axial fluid collection. There is mild cerebral atrophy. Numerous T2 hyperintensities are again seen in the juxtacortical, deep, and periventricular white matter bilaterally as well as in the right  thalamus and pons, not significantly changed from the prior MRI. The periventricular white matter is involved to the greatest extent with multiple lesions oriented perpendicularly to the lateral ventricles. Corpus callosum involvement and associated volume loss are unchanged. There are multiple black holes on T1 weighted imaging, and some lesions demonstrate mild shine through on diffusion-weighted imaging. No enhancing lesions are identified. Left cerebellar encephalomalacia is unchanged. Slightly prominent smooth dural enhancement over both cerebral convexities is unchanged. Vascular: Major intracranial vascular flow voids are preserved. Skull and upper cervical spine: Unremarkable bone marrow signal. Sinuses/Orbits: Unremarkable orbits. Paranasal sinuses and mastoid air cells are clear. Other: None. IMPRESSION: Unchanged white matter disease consistent with multiple sclerosis. No evidence of active demyelination or other acute intracranial abnormality. Electronically Signed   By: Logan Bores M.D.   On: 03/05/2021 17:31   MR CERVICAL SPINE W WO CONTRAST  Result Date: 03/05/2021 CLINICAL DATA:  Multiple sclerosis.  Leg weakness. EXAM: MRI CERVICAL SPINE WITHOUT AND WITH CONTRAST TECHNIQUE: Multiplanar and multiecho pulse sequences of the cervical spine, to include the craniocervical junction and cervicothoracic junction, were obtained without and with intravenous contrast. CONTRAST:  37mL  GADAVIST GADOBUTROL 1 MMOL/ML IV SOLN COMPARISON:  Cervical spine MRI 04/24/2016 FINDINGS: Alignment: Chronic slight reversal of the normal lordosis in the upper cervical spine. No significant listhesis. Vertebrae: No fracture, suspicious marrow lesion, or significant marrow edema. Cord: Patchy T2 hyperintense lesions in the cervical spinal cord are similar to the prior MRI with assessment somewhat limited by motion artifact. No enhancing lesions are present. Posterior Fossa, vertebral arteries, paraspinal tissues: Posterior  fossa more fully evaluated on separate brain MRI. Preserved vertebral artery flow voids. Disc levels: Unchanged mild cervical spondylosis with mild disc bulging from C3-4 to C6-7. No significant stenosis. IMPRESSION: 1. Grossly unchanged cervical spinal cord lesions consistent with multiple sclerosis. No evidence of active demyelination. 2. Unchanged mild cervical spondylosis without significant stenosis. Electronically Signed   By: Logan Bores M.D.   On: 03/05/2021 17:21   MR THORACIC SPINE W WO CONTRAST  Result Date: 03/05/2021 CLINICAL DATA:  Multiple sclerosis.  Leg weakness. EXAM: MRI THORACIC WITHOUT AND WITH CONTRAST TECHNIQUE: Multiplanar and multiecho pulse sequences of the thoracic spine were obtained without and with intravenous contrast. CONTRAST:  62mL GADAVIST GADOBUTROL 1 MMOL/ML IV SOLN COMPARISON:  Thoracic spine MRI 04/21/2016 FINDINGS: Alignment:  Normal. Vertebrae: No fracture, suspicious marrow lesion, or significant marrow edema. Cord: Patchy T2 hyperintensity throughout most of the thoracic spinal cord without convincing interval new or progressive lesion. No abnormal enhancement. Paraspinal and other soft tissues: Unremarkable. Disc levels: A large left paracentral disc protrusion at T10-11 results in mild spinal stenosis with mild left-sided cord flattening, unchanged. Widely patent spinal canal and neural foramina elsewhere. IMPRESSION: 1. Similar appearance of widespread patchy thoracic spinal cord T2 signal abnormality consistent with multiple sclerosis. No evidence of active demyelination. 2. Unchanged T10-11 disc protrusion with mild spinal stenosis. Electronically Signed   By: Logan Bores M.D.   On: 03/05/2021 17:30   CT Abdomen Pelvis W Contrast  Result Date: 03/04/2021 CLINICAL DATA:  Nausea, vomiting and lower abdomen pain for 3 weeks. EXAM: CT ABDOMEN AND PELVIS WITH CONTRAST TECHNIQUE: Multidetector CT imaging of the abdomen and pelvis was performed using the standard  protocol following bolus administration of intravenous contrast. CONTRAST:  180mL OMNIPAQUE IOHEXOL 350 MG/ML SOLN COMPARISON:  April 20, 2016 FINDINGS: Lower chest: Minimal dependent atelectasis of posterior lung bases are noted. The heart size normal. Hepatobiliary: No focal liver abnormality is seen. No gallstones, gallbladder wall thickening, or biliary dilatation. Pancreas: Unremarkable. No pancreatic ductal dilatation or surrounding inflammatory changes. Spleen: Normal in size without focal abnormality. Adrenals/Urinary Tract: Adrenal glands are unremarkable. Kidneys are normal, without renal calculi, focal lesion, or hydronephrosis. Bladder is unremarkable. Stomach/Bowel: Stomach is within normal limits. There is a minimal hiatal hernia. Appendix appears normal. No evidence of bowel wall thickening, distention, or inflammatory changes. Vascular/Lymphatic: Aortic atherosclerosis. No enlarged abdominal or pelvic lymph nodes. Reproductive: Uterus and bilateral adnexa are unremarkable. Probable uterine fibroid unchanged. Other: None Musculoskeletal: Stable probable calcified herniated disc at T10-11 is unchanged. IMPRESSION: 1. No acute abnormality identified in the abdomen and pelvis. 2. Aortic atherosclerosis. Aortic Atherosclerosis (ICD10-I70.0). Electronically Signed   By: Abelardo Diesel M.D.   On: 03/04/2021 12:59        Scheduled Meds:  Dimethyl Fumarate  1 capsule Oral BID   enoxaparin (LOVENOX) injection  40 mg Subcutaneous Q24H   levothyroxine  88 mcg Oral Q0600   oxybutynin  5 mg Oral Daily   sodium chloride flush  3 mL Intravenous Q12H   Continuous Infusions:  sodium chloride 75  mL/hr at 03/06/21 0026     LOS: 1 day    Time spent:25 mins. More than 50% of that time was spent in counseling and/or coordination of care.      Shelly Coss, MD [facility] P1/04/2021, 8:34 AM

## 2021-03-06 NOTE — Progress Notes (Signed)
Inpatient Rehab Admissions Coordinator:   Per therapy recommendations,  patient was screened for CIR candidacy by Megan Salon, MS, CCC-SLP. Case reviewed with rehab MD who felt that Pt. Does not demonstrate medical necessity for AIR admission. I will not pursue for CIR.    Megan Salon, MS, CCC-SLP Rehab Admissions Coordinator  [phone] (celll) [phone] (office)

## 2021-03-06 NOTE — Care Management (Signed)
Spoke to patient's husband Larkyn Greenberger at bedside. Explained [facility] denied [facility]  due to  not demonstrating  medical necessity for CIR  admission.   TOC Team reached out to [facility] and was told they could start authorization but not sure insurance would approve.   MD note states medically ready.    Greig Castilla wanted to know other options.   NCM explained SNF , however insurance would need to approve.   Home health.   Greig Castilla does feel that he can care for Crystal Park at home right now, she cannot stand. He was hoping she could stay here longer until she she stronger. NCM per MD note patient is medically stable for discharge.   Greig Castilla will call family who is a PT and discuss .

## 2021-03-06 NOTE — Progress Notes (Signed)
NIF -13 and VC 1.5L. Pt NIF has improved,VC has decreased. VC still shows good effort. Pt resting comfortably in room.

## 2021-03-06 NOTE — Progress Notes (Signed)
Inpatient Rehab Admissions Coordinator:   Per therapy recommendation, patient was screened for CIR candidacy by Megan Salon, MS, CCC-SLP . At this time, Pt. is not yet tolerating OOB and I'm not confident she can tolerate the intensity of CIR. However,   Pt. may have potential to progress to becoming a potential CIR candidate, so CIR admissions team will follow and monitor for progress and participation with therapies and place consult order if Pt. appears to be an appropriate candidate. Please contact me with any questions.   Megan Salon, MS, CCC-SLP Rehab Admissions Coordinator  [phone] (celll) [phone] (office)

## 2021-03-06 NOTE — Progress Notes (Signed)
NIF -10 and VC 2.0 with good effort. RT attempted NIF 3 times with pt to get a better reading, however -10 was the best that she could do at this time. Pt seems comfortable and is stable at this time.

## 2021-03-06 NOTE — Progress Notes (Addendum)
Initial Nutrition Assessment  DOCUMENTATION CODES:   Not applicable  INTERVENTION:   Encourage good PO intake  Multivitamin w/ minerals daily Ensure Enlive po BID, each supplement provides 350 kcal and 20 grams of protein Recommend obtaining new weight.   NUTRITION DIAGNOSIS:   Inadequate oral intake related to poor appetite as evidenced by per patient/family report.  GOAL:   Patient will meet greater than or equal to 90% of their needs  MONITOR:   PO intake, Supplement acceptance, Weight trends  REASON FOR ASSESSMENT:   Consult Other (Comment) (Nutritional Goals)  ASSESSMENT:   61 y.o. female presented to the ED with abdominal pain, nausea, vomiting, weakness, and poor PO intake. PMH includes MS. Pt admitted with AKI.   Pt reports that her appetite has been ok but not great. Pt unable to provide full diet history, pt reports that she enjoys chicken rolls and vegetable rolls. Pt denies any ONS use at home.   No intakes recorded in EMR.  Pt reports that she weighed 140# and then she dropped to 130#. Pt reports that she has lost ~20# within a couple weeks. Per EMR, pt has had 4% weight loss within 7 months. Pt with no new weight during this admission. Messaged RN.   Per MD note, pt medically stable for discharge.   Medications reviewed. Labs reviewed.   NUTRITION - FOCUSED PHYSICAL EXAM:  Flowsheet Row Most Recent Value  Orbital Region No depletion  Upper Arm Region No depletion  Thoracic and Lumbar Region No depletion  Buccal Region No depletion  Temple Region Mild depletion  Clavicle Bone Region Moderate depletion  Clavicle and Acromion Bone Region Moderate depletion  Scapular Bone Region Moderate depletion  Dorsal Hand Mild depletion  Patellar Region Severe depletion  Anterior Thigh Region Severe depletion  Posterior Calf Region Severe depletion  Edema (RD Assessment) None  Hair Reviewed  Eyes Reviewed  Mouth Reviewed  Skin Reviewed  Nails Reviewed        Diet Order:   Diet Order             Diet regular Room service appropriate? Yes; Fluid consistency: Thin  Diet effective now                   EDUCATION NEEDS:   No education needs have been identified at this time  Skin:  Skin Assessment: Reviewed RN Assessment  Last BM:  03/05/2021  Height:   Ht Readings from Last 1 Encounters:  01/12/21 5\' 6"  (1.676 m)    Weight:   Wt Readings from Last 1 Encounters:  01/12/21 61.9 kg    Ideal Body Weight:  59.1 kg  BMI:  There is no height or weight on file to calculate BMI.  Estimated Nutritional Needs:   Kcal:  1700-1900  Protein:  85-100 grams  Fluid:  >/= 1.7 L    Crystal Park 13/10/22, RD, LDN Clinical Dietitian See [facility] for contact information.

## 2021-03-06 NOTE — TOC Progression Note (Signed)
Transition of Care [facility]) - Progression Note    Patient Details  Name: Crystal Park MRN: [id number] Date of Birth: 1961/01/26  [facility] CM/SW Contact  Jimmy Picket, [place] Phone Number: 03/06/2021, 2:00 PM  Clinical Narrative:     Pt was declined by CIR. CSW sent referral to [facility].   TOC will continue to follow.       Expected Discharge Plan and Services                                                 Social Determinants of Health (SDOH) Interventions    Readmission Risk Interventions No flowsheet data found.  Jimmy Picket, LCSW Clinical Social Worker

## 2021-03-06 NOTE — Plan of Care (Signed)
  Problem: Education: Goal: Knowledge of General Education information will improve Description Including pain rating scale, medication(s)/side effects and non-pharmacologic comfort measures Outcome: Progressing   Problem: Health Behavior/Discharge Planning: Goal: Ability to manage health-related needs will improve Outcome: Progressing   

## 2021-03-06 NOTE — Progress Notes (Signed)
Physical Therapy Treatment [name] MRN: [id number] DOB: 12/03/1960 Today's Date: 03/06/2021   History of Present Illness 60yo female who presented on 12/31 with n/v and abdominal pain, also too weak to stand and walk. Found to have AKI. PMH BPPV, MS, neurogenic bladder    PT Comments    Pt admitted with above diagnosis. Pt was able to scoot to recliner with min to mod assist with use of pad to drop arm recliner. Pt was able to sit EOB with min guard assist today with improved sitting balance.Continue to recommend AIR.   Pt currently with functional limitations due to balance and endurance deficits. Pt will benefit from skilled PT to increase their independence and safety with mobility to allow discharge to the venue listed below.      Recommendations for follow up therapy are one component of a multi-disciplinary discharge planning process, led by the attending physician.  Recommendations may be updated based on patient status, additional functional criteria and insurance authorization.  Follow Up Recommendations  Acute inpatient rehab (3hours/day)     Assistance Recommended at Discharge Frequent or constant Supervision/Assistance  Equipment Recommendations  Rolling walker (2 wheels);BSC/3in1;Wheelchair (measurements PT);Wheelchair cushion (measurements PT)    Recommendations for Other Services       Precautions / Restrictions Precautions Precautions: Fall Restrictions Weight Bearing Restrictions: No     Mobility  Bed Mobility Overal bed mobility: Needs Assistance Bed Mobility: Rolling;Sidelying to Sit;Sit to Supine Rolling: Mod assist Sidelying to sit: Mod assist       General bed mobility comments: mod assist for all aspects of the task. benefits from verbal cues.    Transfers Overall transfer level: Needs assistance Equipment used: 2 person hand held assist Transfers: Bed to chair/wheelchair/BSC            Lateral/Scoot Transfers:  Mod assist;From elevated surface General transfer comment: Pt was able to assist with lateral scoot to drop arm recliner with mod assist with use of pad.Pt scooted herself back in the chair once she was in the chair.    Ambulation/Gait                   Stairs             Wheelchair Mobility    Modified Rankin (Stroke Patients Only)       Balance Overall balance assessment: Needs assistance Sitting-balance support: Feet supported Sitting balance-Leahy Scale: Poor Sitting balance - Comments: required min to min guard for sitting balance                                    Cognition Arousal/Alertness: Awake/alert Behavior During Therapy: Flat affect Overall Cognitive Status: Within Functional Limits for tasks assessed                                 General Comments: cog was [facility] for tasks assessed        Exercises General Exercises - Lower Extremity Ankle Circles/Pumps: AROM;Both;10 reps;Supine Long Arc Quad: AROM;Both;10 reps;Seated Hip Flexion/Marching: AROM;Both;10 reps;Seated    General Comments        Pertinent Vitals/Pain Pain Assessment: Faces Faces Pain Scale: Hurts a little bit Pain Location: generalized wtih movement Pain Descriptors / Indicators: Grimacing Pain Intervention(s): Limited activity within patient's tolerance;Monitored during session;Repositioned    Home Living  Prior Function            PT Goals (current goals can now be found in the care plan section) Acute Rehab PT Goals Patient Stated Goal: go to rehab prior to home Progress towards PT goals: Progressing toward goals    Frequency    Min 4X/week      PT Plan Current plan remains appropriate    Co-evaluation              AM-PAC PT "6 Clicks" Mobility   Outcome Measure  Help needed turning from your back to your side while in a flat bed without using bedrails?: A Lot Help needed moving  from lying on your back to sitting on the side of a flat bed without using bedrails?: A Lot Help needed moving to and from a bed to a chair (including a wheelchair)?: A Lot Help needed standing up from a chair using your arms (e.g., wheelchair or bedside chair)?: Total Help needed to walk in hospital room?: Total Help needed climbing 3-5 steps with a railing? : Total 6 Click Score: 9    End of Session Equipment Utilized During Treatment: Gait belt Activity Tolerance: Patient limited by fatigue Patient left: with call bell/phone within reach;with family/visitor present;in chair;with chair alarm set Nurse Communication: Mobility status PT Visit Diagnosis: Unsteadiness on feet (R26.81);Muscle weakness (generalized) (M62.81);Difficulty in walking, not elsewhere classified (R26.2);History of falling (Z91.81);Other symptoms and signs involving the nervous system (R29.898)     Time: 3734-2876 PT Time Calculation (min) (ACUTE ONLY): 16 min  Charges:  $Therapeutic Activity: 8-22 mins                     Stevon Gough M,PT Acute Rehab Services [phone] (pager)    Bevelyn Buckles 03/06/2021, 1:10 PM

## 2021-03-06 NOTE — Plan of Care (Signed)
Neurology plan of care  MRI brain, c, t spine wwo contrast showed no e/o active demyelination. Suspect recrudescence in setting of viral GI illness and decreased po intake. No further neurologic workup inpatient indicated at this time. Patient may f/u with established outpatient neurologist.  Neurology to sign off, but please re-engage if additional neurologic concerns arise.   Bing Neighbors, MD [facility] [phone]  If 7pm- 7am, please page neurology on call as listed in [facility].

## 2021-03-07 ENCOUNTER — Ambulatory Visit: Payer: Managed Care, Other (non HMO) | Admitting: Physical Therapy

## 2021-03-07 DIAGNOSIS — N179 Acute kidney failure, unspecified: Secondary | ICD-10-CM | POA: Diagnosis not present

## 2021-03-07 LAB — BASIC METABOLIC PANEL
Anion gap: 12 (ref 5–15)
BUN: 6 mg/dL (ref 6–20)
CO2: 31 mmol/L (ref 22–32)
Calcium: 8.7 mg/dL — ABNORMAL LOW (ref 8.9–10.3)
Chloride: 96 mmol/L — ABNORMAL LOW (ref 98–111)
Creatinine, Ser: 0.52 mg/dL (ref 0.44–1.00)
GFR, Estimated: >60 mL/min (ref >60)
Glucose, Bld: 102 mg/dL — ABNORMAL HIGH (ref 70–99)
Potassium: 3.1 mmol/L — ABNORMAL LOW (ref 3.5–5.1)
Sodium: 139 mmol/L (ref 135–145)

## 2021-03-07 LAB — MAGNESIUM: Magnesium: 2 mg/dL (ref 1.7–2.4)

## 2021-03-07 MED ORDER — POTASSIUM CHLORIDE CRYS ER 20 MEQ PO TBCR
40.0000 meq | EXTENDED_RELEASE_TABLET | Freq: Once | ORAL | Status: AC
  Administered 2021-03-07: 40 meq via ORAL
  Filled 2021-03-07: qty 2

## 2021-03-07 NOTE — Progress Notes (Signed)
Physical Therapy Treatment [name] MRN: [id number] DOB: Jul 01, 1960 Today's Date: 03/07/2021   History of Present Illness 61yo female who presented on 12/31 with n/v and abdominal pain, also too weak to stand and walk. Found to have AKI. PMH BPPV, MS, neurogenic bladder    PT Comments    Received pt sitting in recliner with family present at bedside. Pt requesting to return to bed and transferred back to bed stand/squat<>pivot with heavy min A. Pt able to step feet without assist. Took seated rest break, then stood from EOB without AD and mod A - moderate posterior lean noted. Pt then stood from EOB with RW and min A and ambulated 49ft with RW and min A (including 1 90 degree turn). Returned to bed with min A and left with all needs within reach. Acute PT to cont to follow.     Recommendations for follow up therapy are one component of a multi-disciplinary discharge planning process, led by the attending physician.  Recommendations may be updated based on patient status, additional functional criteria and insurance authorization.  Follow Up Recommendations  Acute inpatient rehab (3hours/day)     Assistance Recommended at Discharge Frequent or constant Supervision/Assistance  Patient can return home with the following     Equipment Recommendations  Rolling walker (2 wheels);BSC/3in1;Wheelchair (measurements PT);Wheelchair cushion (measurements PT)    Recommendations for Other Services       Precautions / Restrictions Precautions Precautions: Fall Restrictions Weight Bearing Restrictions: No     Mobility  Bed Mobility Overal bed mobility: Needs Assistance Bed Mobility: Sit to Supine Rolling: Mod assist     Sit to supine: Min assist   General bed mobility comments: min A to bring BLEs into bed and to repositon shoulders in midline Patient Response: Flat affect;Cooperative  Transfers Overall transfer level: Needs assistance Equipment used:  Rolling walker (2 wheels) Transfers: Sit to/from Stand;Bed to chair/wheelchair/BSC Sit to Stand: Min assist Stand pivot transfers: Min assist Squat pivot transfers: Min assist      Lateral/Scoot Transfers: Min assist General transfer comment: pt transferred recliner<>bed stand/squat<>pivot with min A, stood from EOB without AD and mod A, then again from EOB with RW and min A - cues for weight shifting anteriorly to correct posterior lean    Ambulation/Gait Ambulation/Gait assistance: Min assist Gait Distance (Feet): 10 Feet Assistive device: Rolling walker (2 wheels) Gait Pattern/deviations: Step-to pattern;Decreased step length - right;Decreased step length - left;Decreased stride length;Trunk flexed;Narrow base of support Gait velocity: decreased Gait velocity interpretation: <1.31 ft/sec, indicative of household ambulator Pre-gait activities: standing from [employer]             Wheelchair Mobility    Modified Rankin (Stroke Patients Only)       Balance Overall balance assessment: Needs assistance Sitting-balance support: Feet supported;Bilateral upper extremity supported Sitting balance-Leahy Scale: Fair Sitting balance - Comments: supervision   Standing balance support: Single extremity supported (R HHA) Standing balance-Leahy Scale: Poor Standing balance comment: standing with R HHA from therapist - noted posterior lean                            Cognition Arousal/Alertness: Awake/alert Behavior During Therapy: Flat affect Overall Cognitive Status: Within Functional Limits for tasks assessed  Exercises      General Comments General comments (skin integrity, edema, etc.): pt denied any dizziness      Pertinent Vitals/Pain Pain Assessment: No/denies pain    Home Living                          Prior Function            PT Goals (current goals can now be found  in the care plan section) Acute Rehab PT Goals Patient Stated Goal: go to rehab prior to home PT Goal Formulation: With patient/family Time For Goal Achievement: 03/19/21 Potential to Achieve Goals: Good Progress towards PT goals: Progressing toward goals    Frequency    Min 4X/week      PT Plan Current plan remains appropriate    Co-evaluation              AM-PAC PT "6 Clicks" Mobility   Outcome Measure  Help needed turning from your back to your side while in a flat bed without using bedrails?: A Lot Help needed moving from lying on your back to sitting on the side of a flat bed without using bedrails?: A Lot Help needed moving to and from a bed to a chair (including a wheelchair)?: A Little Help needed standing up from a chair using your arms (e.g., wheelchair or bedside chair)?: A Little Help needed to walk in hospital room?: A Little Help needed climbing 3-5 steps with a railing? : Total 6 Click Score: 14    End of Session Equipment Utilized During Treatment: Gait belt Activity Tolerance: Patient tolerated treatment well Patient left: in bed;with call bell/phone within reach;with family/visitor present Nurse Communication: Mobility status PT Visit Diagnosis: Unsteadiness on feet (R26.81);Muscle weakness (generalized) (M62.81);Difficulty in walking, not elsewhere classified (R26.2);History of falling (Z91.81);Other symptoms and signs involving the nervous system (R29.898)     Time: 4098-1191 PT Time Calculation (min) (ACUTE ONLY): 18 min  Charges:  $Therapeutic Activity: 8-22 mins                     Raechel Chute PT, DPT  Alfonso Patten 03/07/2021, 2:17 PM

## 2021-03-07 NOTE — Progress Notes (Signed)
RT note: With good effort patient had a NIF of -20 and a VC of 1.8. Vital signs stable patient resting comfortably.

## 2021-03-07 NOTE — Progress Notes (Signed)
NIF: -23 with excellent patient effort.

## 2021-03-07 NOTE — Progress Notes (Signed)
Inpatient Rehab Admissions Coordinator:   Second MD reviewed case and felt that if insurance approves, she can come to [facility]. I will place a consult order and send her case to insurance for review.   Megan Salon, MS, CCC-SLP Rehab Admissions Coordinator  [phone] (celll) [phone] (office)

## 2021-03-07 NOTE — Progress Notes (Signed)
Occupational Therapy Treatment [name] MRN: [id number] DOB: [birth year] Today's Date: 03/07/2021   History of present illness 61yo female who presented on 12/31 with n/v and abdominal pain, also too weak to stand and walk. Found to have AKI. PMH BPPV, MS, neurogenic bladder   OT comments  Pt progressing towards goals this session, able to transfer to chair min A with lateral scoot/squat pivot transfer, puts minimal weight through BLE during transfers, able to shift self back in chair once seated. Pt min A for seated ADLs, max A +2 for standing ADLs. Pt reported mild dizziness upon sitting EOB, however resolved quickly. Pt presenting with impairments listed below, will continue to follow acutely. Pt motivated to participate in therapy and remains good candidate for AIR/CIR.    Recommendations for follow up therapy are one component of a multi-disciplinary discharge planning process, led by the attending physician.  Recommendations may be updated based on patient status, additional functional criteria and insurance authorization.    Follow Up Recommendations  Acute inpatient rehab (3hours/day)    Assistance Recommended at Discharge Frequent or constant Supervision/Assistance  Patient can return home with the following  Two people to help with walking and/or transfers;Two people to help with bathing/dressing/bathroom;Assistance with cooking/housework;Assist for transportation   Equipment Recommendations  None recommended by OT;Other (comment) (pt has all needed DME)    Recommendations for Other Services Rehab consult    Precautions / Restrictions Precautions Precautions: Fall Restrictions Weight Bearing Restrictions: No       Mobility Bed Mobility Overal bed mobility: Needs Assistance Bed Mobility: Supine to Sit Rolling: Mod assist         General bed mobility comments: mod A to bring BLE to EOB, mild assist to bring trunk upright     Transfers Overall transfer level: Needs assistance Equipment used: 1 person hand held assist Transfers: Bed to chair/wheelchair/BSC       Squat pivot transfers: Min assist    Lateral/Scoot Transfers: Min assist General transfer comment: pt able to lift bottom off of bed to transfer to chair, but was not a true squat-pivot transfer     Balance Overall balance assessment: Needs assistance Sitting-balance support: Feet supported Sitting balance-Leahy Scale: Fair Sitting balance - Comments: supervision - min guard for sitting EOB, pt with mild dizziness, resolved quickly                                   ADL either performed or assessed with clinical judgement   ADL Overall ADL's : Needs assistance/impaired Eating/Feeding: Minimal assistance;Sitting                       Toilet Transfer: +2 for physical assistance;Requires drop arm;Moderate assistance;Squat-pivot Toilet Transfer Details (indicate cue type and reason): simulated to chair   Toileting - Clothing Manipulation Details (indicate cue type and reason): in/out cath at baseline     Functional mobility during ADLs: Maximal assistance;+2 for physical assistance;+2 for safety/equipment      Extremity/Trunk Assessment Upper Extremity Assessment Upper Extremity Assessment: Generalized weakness   Lower Extremity Assessment Lower Extremity Assessment: Defer to PT evaluation        Vision   Vision Assessment?: No apparent visual deficits   Perception Perception Perception: Not tested   Praxis Praxis Praxis: Not tested    Cognition Arousal/Alertness: Awake/alert Behavior During Therapy: Flat affect Overall Cognitive Status: Within Functional Limits for  tasks assessed                                            Exercises     Shoulder Instructions       General Comments VSS on RA    Pertinent Vitals/ Pain       Pain Assessment: No/denies pain  Home Living                                           Prior Functioning/Environment              Frequency  Min 2X/week        Progress Toward Goals  OT Goals(current goals can now be found in the care plan section)  Progress towards OT goals: Progressing toward goals  Acute Rehab OT Goals Patient Stated Goal: none stated OT Goal Formulation: With patient Time For Goal Achievement: 03/19/21 Potential to Achieve Goals: Good ADL Goals Pt Will Perform Grooming: with supervision;standing Pt Will Perform Lower Body Bathing: with supervision;sit to/from stand Pt Will Perform Lower Body Dressing: with supervision;sit to/from stand Pt Will Transfer to Toilet: with supervision;ambulating Pt/caregiver will Perform Home Exercise Program: Increased strength;Both right and left upper extremity;With written HEP provided  Plan Discharge plan remains appropriate;Frequency remains appropriate    Co-evaluation                 AM-PAC OT "6 Clicks" Daily Activity     Outcome Measure   Help from another person eating meals?: A Little Help from another person taking care of personal grooming?: A Lot Help from another person toileting, which includes using toliet, bedpan, or urinal?: Total Help from another person bathing (including washing, rinsing, drying)?: A Lot Help from another person to put on and taking off regular upper body clothing?: A Lot Help from another person to put on and taking off regular lower body clothing?: A Lot 6 Click Score: 12    End of Session    OT Visit Diagnosis: Unsteadiness on feet (R26.81);Other abnormalities of gait and mobility (R26.89);Muscle weakness (generalized) (M62.81);Pain   Activity Tolerance Patient limited by lethargy;Patient limited by fatigue   Patient Left in chair;with chair alarm set;with nursing/sitter in room;with family/visitor present;with call bell/phone within reach;Other (comment) (NT's in room to bathe pt)   Nurse  Communication Mobility status;Other (comment) (pt needs to be cath'd)        Time: 1448-1856 OT Time Calculation (min): 17 min  Charges: OT General Charges $OT Visit: 1 Visit OT Treatments $Self Care/Home Management : 8-22 mins  Alfonzo Beers, OTD, OTR/L Acute Rehab [phone]) [phone]   Mayer Masker 03/07/2021, 12:19 PM

## 2021-03-07 NOTE — TOC Progression Note (Signed)
Transition of Care [facility]) - Progression Note    Patient Details  Name: Crystal Park MRN: [id number] Date of Birth: [birth year]  [facility] CM/SW Contact  Carles Collet, RN Phone Number: 03/07/2021, 11:09 AM  Clinical Narrative:     Spoke with patient's spouse to discuss options for DC, following up on yesterday's conversation w TOC as patient is medically stable for DC. Spouse states that patient's sister is a PT with [facility] and has requested case be considered again. Confirmed w CIR liaison that she was aware of request. CIR MD to review again today. Requested [facility] to explain to spouse reason for denial if upheld after today's review.  Spouse states that PTA patient was "fairly independent" w assist of RW/ cane in the home. She could get up get dressed, shower, and make her own food.  He is concerned of her safety at home esp related to getting to the bathroom to self cath every 4-6 hours.   Will await to hear from [place] re today's determination.          Expected Discharge Plan and Services                                                 Social Determinants of Health (SDOH) Interventions    Readmission Risk Interventions No flowsheet data found.

## 2021-03-07 NOTE — Progress Notes (Addendum)
PROGRESS NOTE    [place]  KDT:[id number] DOB: [birth year] DOA: 03/03/2021 PCP: Daisy Floro, MD   Chief Complain: Nausea, weakness  Brief Narrative: Patient is a 61 year old female with history of multiple sclerosis who presented with nausea, vomiting, abdominal pain, weakness, inability to ambulate.  Reported loose stools at home, poor appetite, poor oral intake.  On presentation she was found to be dehydrated.  Lab work showed leukocytosis, mild AKI, anion gap metabolic acidosis with CO2 of 12.  She was admitted for further work-up.  Neurology consulted for suspicion of flare of multiple sclerosis.  MRI of the brain, cervical and thoracic spine did not show any evidence of acute demyelination and showed unchanged white matter disease.  PT/OT consulted and recommended CIR on discharge.  Hospital course remarkable for persistent weakness.  Medically stable for discharge to CIR whenever possible.  Assessment & Plan:   Principal Problem:   AKI (acute kidney injury) (HCC) Active Problems:   MS (multiple sclerosis) (HCC)   Generalized weakness   Neurogenic bladder   AKI: Likely secondary to decreased oral intake, nausea, vomiting and diarrhea.  Lab work showed normal anion gap metabolic acidosis with CO2 of 12 on presentation, likely from severe diarrhea..  Treated with IV fluids,fluid changed to bicarb drip,now stopped.  Currently kidney function at baseline.  Nausea, vomiting, diarrhea: Most likely secondary to viral illness.  Symptoms have been better now.Diarrhoea stopped.  Continue supportive care.  CT abdomen/pelvis did not show any acute intra-abdominal abnormalities  Multiple sclerosis/generalized weakness: Generalized weakness is most likely secondary to dehydration, poor oral intake. She has history of multiple sclerosis.  Neurology was following.MRI of the brain, cervical and thoracic spine did not show any evidence of acute demyelination and showed unchanged white  matter disease  Neurogenic bladder/dysuria: Patient also has history of neurogenic bladder.  Does self-catheterization every 4 at home.  UA was not suspicious for UTI.  Continue oxybutynin. Complaints of dysuria now much better.  Urine culture sent, holding on antibiotics for now.  Hypokalemia: Supplement with potassium.  Debility/deconditioning: Physical therapy recommending CIR on discharge.    Nutrition Problem: Inadequate oral intake Etiology: poor appetite      DVT prophylaxis:Lovenox Code Status: Full Family Communication: Husband at bedside Patient status:Inpatient  Dispo: The patient is from: Home              Anticipated d/c is to:CIR               Anticipated d/c date is: As soon as bed is available at [facility]  Consultants: Neurology  Procedures: None  Antimicrobials:  Anti-infectives (From admission, onward)    None       Subjective:  Patient seen and examined at the bedside this morning.  Hemodynamically stable.  Lying in bed.  She states she feels little better today.  Nausea has improved.  She ate some food.  Still weak but better than yesterday.   Objective: Vitals:   03/06/21 1635 03/06/21 2009 03/07/21 0432 03/07/21 0747  BP: 130/84 132/80 138/86 127/85  Pulse: (!) 104 (!) 109 (!) 110 (!) 108  Resp: 18 17 17 17   Temp: 98.1 F (36.7 C) 98.3 F (36.8 C) 98.9 F (37.2 C) 98.3 F (36.8 C)  TempSrc: Oral Oral Oral Oral  SpO2: 100% 97% 100% 97%  Weight:      Height:        Intake/Output Summary (Last 24 hours) at 03/07/2021 0757 Last data filed at 03/07/2021 0300 Gross per  24 hour  Intake 1751.37 ml  Output 1732 ml  Net 19.37 ml   Filed Weights   03/06/21 1500  Weight: 55.5 kg    Examination:  General exam: not in distress,weak, chronically ill looking  HEENT: PERRL Respiratory system:  no wheezes or crackles  Cardiovascular system: S1 & S2 heard, RRR.  Gastrointestinal system: Abdomen is nondistended, soft and nontender. Central  nervous system: Alert and oriented Extremities: No edema, no clubbing ,no cyanosis Skin: No rashes, no ulcers,no icterus     Data Reviewed: I have personally reviewed following labs and imaging studies  CBC: Recent Labs  Lab 03/03/21 2328 03/05/21 0753  WBC 16.4* 11.9*  NEUTROABS 14.2*  --   HGB 17.1* 13.2  HCT 52.5* 40.3  MCV 98.3 96.4  PLT 431* [id number]   Basic Metabolic Panel: Recent Labs  Lab 03/03/21 2328 03/05/21 0753 03/07/21 0346  NA 136 141 139  K 4.5 4.2 3.1*  CL 106 111 96*  CO2 12* 18* 31  GLUCOSE 126* 101* 102*  BUN 11 8 6   CREATININE 1.16* 0.74 0.52  CALCIUM 9.7 8.8* 8.7*   GFR: Estimated Creatinine Clearance: 67.2 mL/min (by C-G formula based on SCr of 0.52 mg/dL). Liver Function Tests: Recent Labs  Lab 03/03/21 2328  AST 18  ALT 22  ALKPHOS 78  BILITOT 1.2  PROT 8.4*  ALBUMIN 4.8   No results for input(s): LIPASE, AMYLASE in the last 168 hours. No results for input(s): AMMONIA in the last 168 hours. Coagulation Profile: No results for input(s): INR, PROTIME in the last 168 hours. Cardiac Enzymes: Recent Labs  Lab 03/05/21 1658  CKTOTAL 67   BNP (last 3 results) No results for input(s): PROBNP in the last 8760 hours. HbA1C: No results for input(s): HGBA1C in the last 72 hours. CBG: No results for input(s): GLUCAP in the last 168 hours. Lipid Profile: No results for input(s): CHOL, HDL, LDLCALC, TRIG, CHOLHDL, LDLDIRECT in the last 72 hours. Thyroid Function Tests: No results for input(s): TSH, T4TOTAL, FREET4, T3FREE, THYROIDAB in the last 72 hours. Anemia Panel: No results for input(s): VITAMINB12, FOLATE, FERRITIN, TIBC, IRON, RETICCTPCT in the last 72 hours. Sepsis Labs: Recent Labs  Lab 03/03/21 2328  LATICACIDVEN 1.4    Recent Results (from the past 240 hour(s))  Resp Panel by RT-PCR (Flu A&B, Covid) Nasopharyngeal Swab     Status: None   Collection Time: 03/04/21  9:40 AM   Specimen: Nasopharyngeal Swab; Nasopharyngeal(NP)  swabs in vial transport medium  Result Value Ref Range Status   SARS Coronavirus 2 by RT PCR NEGATIVE NEGATIVE Final    Comment: (NOTE) SARS-CoV-2 target nucleic acids are NOT DETECTED.  The SARS-CoV-2 RNA is generally detectable in upper respiratory specimens during the acute phase of infection. The lowest concentration of SARS-CoV-2 viral copies this assay can detect is 138 copies/mL. A negative result does not preclude SARS-Cov-2 infection and should not be used as the sole basis for treatment or other patient management decisions. A negative result may occur with  improper specimen collection/handling, submission of specimen other than nasopharyngeal swab, presence of viral mutation(s) within the areas targeted by this assay, and inadequate number of viral copies(<138 copies/mL). A negative result must be combined with clinical observations, patient history, and epidemiological information. The expected result is Negative.  Fact Sheet for Patients:  EntrepreneurPulse.com.au  Fact Sheet for Healthcare Providers:  IncredibleEmployment.be  This test is no t yet approved or cleared by the [place] FDA and  has  been authorized for detection and/or diagnosis of SARS-CoV-2 by FDA under an Emergency Use Authorization (EUA). This EUA will remain  in effect (meaning this test can be used) for the duration of the COVID-19 declaration under Section 564(b)(1) of the Act, 21 U.S.C.section 360bbb-3(b)(1), unless the authorization is terminated  or revoked sooner.       Influenza A by PCR NEGATIVE NEGATIVE Final   Influenza B by PCR NEGATIVE NEGATIVE Final    Comment: (NOTE) The Xpert Xpress SARS-CoV-2/FLU/RSV plus assay is intended as an aid in the diagnosis of influenza from Nasopharyngeal swab specimens and should not be used as a sole basis for treatment. Nasal washings and aspirates are unacceptable for Xpert Xpress  SARS-CoV-2/FLU/RSV testing.  Fact Sheet for Patients: EntrepreneurPulse.com.au  Fact Sheet for Healthcare Providers: IncredibleEmployment.be  This test is not yet approved or cleared by the [place] FDA and has been authorized for detection and/or diagnosis of SARS-CoV-2 by FDA under an Emergency Use Authorization (EUA). This EUA will remain in effect (meaning this test can be used) for the duration of the COVID-19 declaration under Section 564(b)(1) of the Act, 21 U.S.C. section 360bbb-3(b)(1), unless the authorization is terminated or revoked.  Performed at [facility], [place] [address], [place], [place] [address]          Radiology Studies: MR BRAIN W WO CONTRAST  Result Date: 03/05/2021 CLINICAL DATA:  Multiple sclerosis.  Leg weakness. EXAM: MRI HEAD WITHOUT AND WITH CONTRAST TECHNIQUE: Multiplanar, multiecho pulse sequences of the brain and surrounding structures were obtained without and with intravenous contrast. CONTRAST:  58mL GADAVIST GADOBUTROL 1 MMOL/ML IV SOLN COMPARISON:  Head CT 03/04/2021 and MRI 01/13/2020 FINDINGS: Brain: There is no evidence of an acute infarct, intracranial hemorrhage, mass, midline shift, or extra-axial fluid collection. There is mild cerebral atrophy. Numerous T2 hyperintensities are again seen in the juxtacortical, deep, and periventricular white matter bilaterally as well as in the right thalamus and pons, not significantly changed from the prior MRI. The periventricular white matter is involved to the greatest extent with multiple lesions oriented perpendicularly to the lateral ventricles. Corpus callosum involvement and associated volume loss are unchanged. There are multiple black holes on T1 weighted imaging, and some lesions demonstrate mild shine through on diffusion-weighted imaging. No enhancing lesions are identified. Left cerebellar encephalomalacia is unchanged. Slightly prominent smooth dural  enhancement over both cerebral convexities is unchanged. Vascular: Major intracranial vascular flow voids are preserved. Skull and upper cervical spine: Unremarkable bone marrow signal. Sinuses/Orbits: Unremarkable orbits. Paranasal sinuses and mastoid air cells are clear. Other: None. IMPRESSION: Unchanged white matter disease consistent with multiple sclerosis. No evidence of active demyelination or other acute intracranial abnormality. Electronically Signed   By: Logan Bores M.D.   On: 03/05/2021 17:31   MR CERVICAL SPINE W WO CONTRAST  Result Date: 03/05/2021 CLINICAL DATA:  Multiple sclerosis.  Leg weakness. EXAM: MRI CERVICAL SPINE WITHOUT AND WITH CONTRAST TECHNIQUE: Multiplanar and multiecho pulse sequences of the cervical spine, to include the craniocervical junction and cervicothoracic junction, were obtained without and with intravenous contrast. CONTRAST:  48mL GADAVIST GADOBUTROL 1 MMOL/ML IV SOLN COMPARISON:  Cervical spine MRI 04/24/2016 FINDINGS: Alignment: Chronic slight reversal of the normal lordosis in the upper cervical spine. No significant listhesis. Vertebrae: No fracture, suspicious marrow lesion, or significant marrow edema. Cord: Patchy T2 hyperintense lesions in the cervical spinal cord are similar to the prior MRI with assessment somewhat limited by motion artifact. No enhancing lesions are present. Posterior  Fossa, vertebral arteries, paraspinal tissues: Posterior fossa more fully evaluated on separate brain MRI. Preserved vertebral artery flow voids. Disc levels: Unchanged mild cervical spondylosis with mild disc bulging from C3-4 to C6-7. No significant stenosis. IMPRESSION: 1. Grossly unchanged cervical spinal cord lesions consistent with multiple sclerosis. No evidence of active demyelination. 2. Unchanged mild cervical spondylosis without significant stenosis. Electronically Signed   By: Logan Bores M.D.   On: 03/05/2021 17:21   MR THORACIC SPINE W WO CONTRAST  Result Date:  03/05/2021 CLINICAL DATA:  Multiple sclerosis.  Leg weakness. EXAM: MRI THORACIC WITHOUT AND WITH CONTRAST TECHNIQUE: Multiplanar and multiecho pulse sequences of the thoracic spine were obtained without and with intravenous contrast. CONTRAST:  22mL GADAVIST GADOBUTROL 1 MMOL/ML IV SOLN COMPARISON:  Thoracic spine MRI 04/21/2016 FINDINGS: Alignment:  Normal. Vertebrae: No fracture, suspicious marrow lesion, or significant marrow edema. Cord: Patchy T2 hyperintensity throughout most of the thoracic spinal cord without convincing interval new or progressive lesion. No abnormal enhancement. Paraspinal and other soft tissues: Unremarkable. Disc levels: A large left paracentral disc protrusion at T10-11 results in mild spinal stenosis with mild left-sided cord flattening, unchanged. Widely patent spinal canal and neural foramina elsewhere. IMPRESSION: 1. Similar appearance of widespread patchy thoracic spinal cord T2 signal abnormality consistent with multiple sclerosis. No evidence of active demyelination. 2. Unchanged T10-11 disc protrusion with mild spinal stenosis. Electronically Signed   By: Logan Bores M.D.   On: 03/05/2021 17:30        Scheduled Meds:  Dimethyl Fumarate  1 capsule Oral BID   enoxaparin (LOVENOX) injection  40 mg Subcutaneous Q24H   feeding supplement  237 mL Oral BID BM   levothyroxine  88 mcg Oral Q0600   multivitamin with minerals  1 tablet Oral Daily   oxybutynin  5 mg Oral Daily   potassium chloride  40 mEq Oral Once   sodium chloride flush  3 mL Intravenous Q12H   Continuous Infusions:     LOS: 2 days    Time spent:25 mins. More than 50% of that time was spent in counseling and/or coordination of care.      Shelly Coss, MD [facility] P1/05/2021, 7:57 AM

## 2021-03-08 ENCOUNTER — Inpatient Hospital Stay (HOSPITAL_COMMUNITY)
Admission: RE | Admit: 2021-03-08 | Discharge: 2021-03-23 | DRG: 093 | Disposition: A | Discharge status: Home / Self Care | Payer: Managed Care, Other (non HMO) | Source: Intra-hospital | Attending: Physical Medicine and Rehabilitation | Admitting: Physical Medicine and Rehabilitation

## 2021-03-08 ENCOUNTER — Encounter (HOSPITAL_COMMUNITY): Payer: Self-pay | Admitting: Internal Medicine

## 2021-03-08 ENCOUNTER — Other Ambulatory Visit: Payer: Self-pay

## 2021-03-08 ENCOUNTER — Encounter (HOSPITAL_COMMUNITY): Payer: Self-pay | Admitting: Physical Medicine and Rehabilitation

## 2021-03-08 DIAGNOSIS — N319 Neuromuscular dysfunction of bladder, unspecified: Secondary | ICD-10-CM | POA: Diagnosis present

## 2021-03-08 DIAGNOSIS — G35 Multiple sclerosis: Secondary | ICD-10-CM | POA: Diagnosis present

## 2021-03-08 DIAGNOSIS — E876 Hypokalemia: Secondary | ICD-10-CM | POA: Diagnosis present

## 2021-03-08 DIAGNOSIS — N342 Other urethritis: Secondary | ICD-10-CM | POA: Diagnosis present

## 2021-03-08 DIAGNOSIS — Z833 Family history of diabetes mellitus: Secondary | ICD-10-CM | POA: Diagnosis not present

## 2021-03-08 DIAGNOSIS — K5900 Constipation, unspecified: Secondary | ICD-10-CM | POA: Diagnosis present

## 2021-03-08 DIAGNOSIS — Z79899 Other long term (current) drug therapy: Secondary | ICD-10-CM

## 2021-03-08 DIAGNOSIS — Z8614 Personal history of Methicillin resistant Staphylococcus aureus infection: Secondary | ICD-10-CM

## 2021-03-08 DIAGNOSIS — H5500 Unspecified nystagmus: Secondary | ICD-10-CM | POA: Diagnosis present

## 2021-03-08 DIAGNOSIS — Z803 Family history of malignant neoplasm of breast: Secondary | ICD-10-CM

## 2021-03-08 DIAGNOSIS — K5901 Slow transit constipation: Secondary | ICD-10-CM | POA: Diagnosis present

## 2021-03-08 DIAGNOSIS — Z87442 Personal history of urinary calculi: Secondary | ICD-10-CM | POA: Diagnosis not present

## 2021-03-08 DIAGNOSIS — R519 Headache, unspecified: Secondary | ICD-10-CM | POA: Diagnosis present

## 2021-03-08 DIAGNOSIS — R2689 Other abnormalities of gait and mobility: Principal | ICD-10-CM | POA: Diagnosis present

## 2021-03-08 DIAGNOSIS — N390 Urinary tract infection, site not specified: Secondary | ICD-10-CM | POA: Diagnosis not present

## 2021-03-08 DIAGNOSIS — Z7983 Long term (current) use of bisphosphonates: Secondary | ICD-10-CM

## 2021-03-08 DIAGNOSIS — N179 Acute kidney failure, unspecified: Secondary | ICD-10-CM | POA: Diagnosis not present

## 2021-03-08 DIAGNOSIS — R000 Tachycardia, unspecified: Secondary | ICD-10-CM | POA: Diagnosis present

## 2021-03-08 DIAGNOSIS — R269 Unspecified abnormalities of gait and mobility: Secondary | ICD-10-CM

## 2021-03-08 LAB — BASIC METABOLIC PANEL
Anion gap: 7 (ref 5–15)
BUN: 15 mg/dL (ref 6–20)
CO2: 30 mmol/L (ref 22–32)
Calcium: 9.5 mg/dL (ref 8.9–10.3)
Chloride: 101 mmol/L (ref 98–111)
Creatinine, Ser: 0.51 mg/dL (ref 0.44–1.00)
GFR, Estimated: >60 mL/min (ref >60)
Glucose, Bld: 134 mg/dL — ABNORMAL HIGH (ref 70–99)
Potassium: 3.2 mmol/L — ABNORMAL LOW (ref 3.5–5.1)
Sodium: 138 mmol/L (ref 135–145)

## 2021-03-08 LAB — URINE CULTURE: Culture: >=100000 — AB

## 2021-03-08 LAB — TSH: TSH: 1.582 u[IU]/mL (ref 0.350–4.500)

## 2021-03-08 MED ORDER — PROCHLORPERAZINE 25 MG RE SUPP: 12.5000 mg | Freq: Four times a day (QID) | RECTAL | Status: DC | PRN

## 2021-03-08 MED ORDER — PROPRANOLOL HCL 10 MG PO TABS
10.0000 mg | ORAL_TABLET | Freq: Every day | ORAL | Status: DC
  Administered 2021-03-08 – 2021-03-09 (×2): 10 mg via ORAL
  Filled 2021-03-08 (×2): qty 1

## 2021-03-08 MED ORDER — LIDOCAINE HCL URETHRAL/MUCOSAL 2 % EX GEL
CUTANEOUS | Status: DC | PRN
  Administered 2021-03-11 – 2021-03-12 (×2): 6 via TOPICAL
  Filled 2021-03-08 (×3): qty 6

## 2021-03-08 MED ORDER — DIMETHYL FUMARATE 240 MG PO CPDR
240.0000 mg | DELAYED_RELEASE_CAPSULE | Freq: Two times a day (BID) | ORAL | Status: DC
  Administered 2021-03-08 – 2021-03-22 (×29): 240 mg via ORAL
  Filled 2021-03-08 (×27): qty 1

## 2021-03-08 MED ORDER — CEPHALEXIN 500 MG PO CAPS: 500.0000 mg | ORAL_CAPSULE | Freq: Three times a day (TID) | ORAL | 0 refills | Status: DC

## 2021-03-08 MED ORDER — TRAZODONE HCL 50 MG PO TABS
25.0000 mg | ORAL_TABLET | Freq: Every evening | ORAL | Status: DC | PRN
  Filled 2021-03-08: qty 1

## 2021-03-08 MED ORDER — CEPHALEXIN 500 MG PO CAPS
500.0000 mg | ORAL_CAPSULE | Freq: Three times a day (TID) | ORAL | Status: DC
Start: 2021-03-08 — End: 2021-03-08
  Administered 2021-03-08: 500 mg via ORAL
  Filled 2021-03-08: qty 1

## 2021-03-08 MED ORDER — ENOXAPARIN SODIUM 40 MG/0.4ML IJ SOSY
40.0000 mg | PREFILLED_SYRINGE | INTRAMUSCULAR | Status: DC
  Administered 2021-03-08 – 2021-03-22 (×15): 40 mg via SUBCUTANEOUS
  Filled 2021-03-08 (×16): qty 0.4

## 2021-03-08 MED ORDER — OXYBUTYNIN CHLORIDE 5 MG PO TABS
5.0000 mg | ORAL_TABLET | Freq: Every day | ORAL | Status: DC
  Filled 2021-03-08: qty 1

## 2021-03-08 MED ORDER — PHENAZOPYRIDINE HCL 200 MG PO TABS
200.0000 mg | ORAL_TABLET | Freq: Three times a day (TID) | ORAL | Status: AC
  Administered 2021-03-08 – 2021-03-10 (×6): 200 mg via ORAL
  Filled 2021-03-08 (×8): qty 1

## 2021-03-08 MED ORDER — LEVOTHYROXINE SODIUM 88 MCG PO TABS
88.0000 ug | ORAL_TABLET | Freq: Every day | ORAL | Status: DC
  Administered 2021-03-09 – 2021-03-23 (×15): 88 ug via ORAL
  Filled 2021-03-08 (×19): qty 1

## 2021-03-08 MED ORDER — FLEET ENEMA 7-19 GM/118ML RE ENEM: 1.0000 | ENEMA | Freq: Once | RECTAL | Status: DC | PRN

## 2021-03-08 MED ORDER — POTASSIUM CHLORIDE CRYS ER 20 MEQ PO TBCR
40.0000 meq | EXTENDED_RELEASE_TABLET | Freq: Once | ORAL | Status: AC
  Administered 2021-03-08: 40 meq via ORAL
  Filled 2021-03-08: qty 2

## 2021-03-08 MED ORDER — BISACODYL 10 MG RE SUPP
10.0000 mg | Freq: Every day | RECTAL | Status: DC | PRN
  Administered 2021-03-10 – 2021-03-14 (×2): 10 mg via RECTAL
  Filled 2021-03-08 (×2): qty 1

## 2021-03-08 MED ORDER — DIAZEPAM 5 MG PO TABS: 5.0000 mg | ORAL_TABLET | Freq: Three times a day (TID) | ORAL | Status: DC | PRN

## 2021-03-08 MED ORDER — GUAIFENESIN-DM 100-10 MG/5ML PO SYRP: 5.0000 mL | ORAL_SOLUTION | Freq: Four times a day (QID) | ORAL | Status: DC | PRN

## 2021-03-08 MED ORDER — POLYETHYLENE GLYCOL 3350 17 G PO PACK
17.0000 g | PACK | Freq: Every day | ORAL | Status: DC | PRN
  Administered 2021-03-09: 17 g via ORAL
  Filled 2021-03-08: qty 1

## 2021-03-08 MED ORDER — ACETAMINOPHEN 325 MG PO TABS
325.0000 mg | ORAL_TABLET | ORAL | Status: DC | PRN
  Administered 2021-03-08 – 2021-03-20 (×11): 650 mg via ORAL
  Filled 2021-03-08 (×11): qty 2

## 2021-03-08 MED ORDER — PROCHLORPERAZINE MALEATE 5 MG PO TABS
5.0000 mg | ORAL_TABLET | Freq: Four times a day (QID) | ORAL | Status: DC | PRN
  Administered 2021-03-15: 10 mg via ORAL
  Filled 2021-03-08: qty 2

## 2021-03-08 MED ORDER — CEPHALEXIN 250 MG PO CAPS
500.0000 mg | ORAL_CAPSULE | Freq: Three times a day (TID) | ORAL | Status: AC
  Administered 2021-03-08 – 2021-03-13 (×14): 500 mg via ORAL
  Filled 2021-03-08 (×19): qty 2

## 2021-03-08 MED ORDER — ASPIRIN-ACETAMINOPHEN-CAFFEINE 250-250-65 MG PO TABS
1.0000 | ORAL_TABLET | Freq: Four times a day (QID) | ORAL | Status: DC | PRN
  Administered 2021-03-15: 1 via ORAL
  Filled 2021-03-08 (×2): qty 1

## 2021-03-08 MED ORDER — ALUM & MAG HYDROXIDE-SIMETH 200-200-20 MG/5ML PO SUSP: 30.0000 mL | ORAL | Status: DC | PRN

## 2021-03-08 MED ORDER — PROCHLORPERAZINE EDISYLATE 10 MG/2ML IJ SOLN: 5.0000 mg | Freq: Four times a day (QID) | INTRAMUSCULAR | Status: DC | PRN

## 2021-03-08 MED ORDER — ADULT MULTIVITAMIN W/MINERALS CH
1.0000 | ORAL_TABLET | Freq: Every day | ORAL | Status: DC
  Administered 2021-03-09 – 2021-03-23 (×15): 1 via ORAL
  Filled 2021-03-08 (×15): qty 1

## 2021-03-08 MED ORDER — CRANBERRY 300 MG PO TABS: 500.0000 mg | ORAL_TABLET | Freq: Every day | ORAL | Status: DC

## 2021-03-08 MED ORDER — DIPHENHYDRAMINE HCL 12.5 MG/5ML PO ELIX: 12.5000 mg | ORAL_SOLUTION | Freq: Four times a day (QID) | ORAL | Status: DC | PRN

## 2021-03-08 NOTE — PMR Pre-admission (Signed)
PMR Admission Coordinator Pre-Admission Assessment °  °Patient: Crystal Park is an 61 y.o., female °MRN: [id number] °DOB: 11/20/1960 °Height: 5' 6" (167.6 cm) °Weight: 55.5 kg °  °Insurance Information °HMO: yes    PPO:      PCP:      IPA:      80/20:      OTHER:  °PRIMARY: [employer]      Policy#: [id number]      Subscriber: Pt °CM Name: Dana White       Phone#: [phone]     Fax#: [phone]  I received a fax from Donna Hiscock  giving auth from 1/2-1/10 with updates due 1/10 °Pre-Cert#: [id number]      Employer: n/a °Benefits:  Phone #: [phone]     Name: none °Eff Date: 05/04/2011 - 03/04/2022  °Deductible: does not have individual, family $3,400 ($0 met) °OOP Max: does not have individual, family $3,750 ($0 met) °CIR: 85% coverage, 15% co-insurance °SNF: 85% coverage, 15% co-insurance; limited to 60 visits/cal yr (60 remaining) °Outpatient: 85% coverage, 15% co-insurance °Home Health:  85% coverage, 15% co-insurance; limited to 80 visits/cal yr (80 remaining), auth required °DME: 85% coverage, 15% co-insurance °Providers: in network °  °SECONDARY: Medicare Part A       Policy#: [id number]     Phone#:  °  °Financial Counselor:       Phone#:  °  °The “Data Collection Information Summary” for patients in Inpatient Rehabilitation Facilities with attached “Privacy Act Statement-Health Care Records” was provided and verbally reviewed with: Patient °  °Emergency Contact Information °Contact Information   °  °  Name Relation Home Work Mobile  °  Carley,Andrew J Spouse [phone]   [phone]  °  °   °  °  °Current Medical History  °Patient Admitting Diagnosis: AKI, MS Pseudoexacerbation °History of Present Illness: Crystal Park is a 61 y.o. female with medical history significant of MS presented to the ED 03/04/21 with n/v and abdominal pain.    During admission, Pt. Experienced progressive weakness, inability  to walk even with walker.  AF, P120, BP ok.  Dehydrated.  Normal lactate,  mild AKI, CO2 12.  WBC 16, Hgb 17 - likely hemoconcentrated.  No UTI.  COVID/flu negative.  Imaging unremarkable.  Treated with IV fluids,fluid changed to bicarb drip, stopped 1/3.  °  °Patient's medical record from [facility] has been reviewed by the rehabilitation admission coordinator and physician. °  °Past Medical History  °    °Past Medical History:  °Diagnosis Date  ° Benign paroxysmal positional vertigo 10/12/2014  ° Constipation    ° Family history of breast cancer    ° Headache    °  history of migaines  ° History of kidney stones    ° History of MRSA infection    ° History of normocytic normochromic anemia    ° MS (multiple sclerosis) (HCC)    °  diagnosed 9/16  ° Neurogenic bladder    °  self-caths  ° PONV (postoperative nausea and vomiting)    °  °  °Has the patient had major surgery during 100 days prior to admission? Yes °  °Family History   °family history is not on file. °  °Current Medications °  °Current Facility-Administered Medications:  °  acetaminophen (TYLENOL) tablet 650 mg, 650 mg, Oral, Q6H PRN, 650 mg at 03/07/21 1814 **OR** acetaminophen (TYLENOL) suppository 650 mg, 650 mg, Rectal, Q6H PRN, Blount, Xenia T, NP °    Dimethyl Fumarate CPDR 240 mg, 1 capsule, Oral, BID, Yates, Jennifer, MD, 240 mg at 03/08/21 0027 °  enoxaparin (LOVENOX) injection 40 mg, 40 mg, Subcutaneous, Q24H, Yates, Jennifer, MD, 40 mg at 03/07/21 2009 °  feeding supplement (ENSURE ENLIVE / ENSURE PLUS) liquid 237 mL, 237 mL, Oral, BID BM, Adhikari, Amrit, MD, 237 mL at 03/07/21 1019 °  hydrALAZINE (APRESOLINE) injection 5 mg, 5 mg, Intravenous, Q4H PRN, Yates, Jennifer, MD °  levothyroxine (SYNTHROID) tablet 88 mcg, 88 mcg, Oral, Q0600, Yates, Jennifer, MD, 88 mcg at 03/08/21 0527 °  morphine 2 MG/ML injection 2 mg, 2 mg, Intravenous, Q2H PRN, Yates, Jennifer, MD °  multivitamin with minerals tablet 1 tablet, 1 tablet, Oral, Daily, Adhikari, Amrit, MD, 1 tablet at 03/07/21 1012 °  ondansetron (ZOFRAN)  tablet 4 mg, 4 mg, Oral, Q6H PRN, 4 mg at 03/05/21 0939 **OR** ondansetron (ZOFRAN) injection 4 mg, 4 mg, Intravenous, Q6H PRN, Yates, Jennifer, MD, 4 mg at 03/05/21 1918 °  oxybutynin (DITROPAN) tablet 5 mg, 5 mg, Oral, Daily, Yates, Jennifer, MD, 5 mg at 03/07/21 1012 °  oxyCODONE (Oxy IR/ROXICODONE) immediate release tablet 5 mg, 5 mg, Oral, Q4H PRN, Yates, Jennifer, MD, 5 mg at 03/05/21 2052 °  potassium chloride SA (KLOR-CON M) CR tablet 40 mEq, 40 mEq, Oral, Once, Adhikari, Amrit, MD °  sodium chloride flush (NS) 0.9 % injection 3 mL, 3 mL, Intravenous, Q12H, Yates, Jennifer, MD, 3 mL at 03/07/21 2200 °  °Patients Current Diet:  °Diet Order   °  °         °    Diet regular Room service appropriate? Yes; Fluid consistency: Thin  Diet effective now       °  °  °   °  °  °   °  °  °Precautions / Restrictions °Precautions °Precautions: Fall °Restrictions °Weight Bearing Restrictions: No  °  °Has the patient had 2 or more falls or a fall with injury in the past year? No °  °Prior Activity Level °Limited Community (1-2x/wk): Pt. went out 1-2x a week °  °Prior Functional Level °Self Care: Did the patient need help bathing, dressing, using the toilet or eating? Independent °  °Indoor Mobility: Did the patient need assistance with walking from room to room (with or without device)? Independent °  °Stairs: Did the patient need assistance with internal or external stairs (with or without device)? Independent °  °Functional Cognition: Did the patient need help planning regular tasks such as shopping or remembering to take medications? Needed some help °  °Patient Information °Are you of Hispanic, Latino/a,or Spanish origin?: A. No, not of Hispanic, Latino/a, or Spanish origin °What is your race?: A. White °Do you need or want an interpreter to communicate with a doctor or health care staff?: 0. No °  °Patient's Response To:  °Health Literacy and Transportation °Is the patient able to respond to health literacy and  transportation needs?: Yes °Health Literacy - How often do you need to have someone help you when you read instructions, pamphlets, or other written material from your doctor or pharmacy?: Never °In the past 12 months, has lack of transportation kept you from medical appointments or from getting medications?: No °In the past 12 months, has lack of transportation kept you from meetings, work, or from getting things needed for daily living?: No °  °Home Assistive Devices / Equipment °Home Equipment: Grab bars - tub/shower, Shower seat, Rolling Walker (2 wheels), Standard Walker, Rollator (  4 wheels), Cane - quad °  °Prior Device Use: Indicate devices/aids used by the patient prior to current illness, exacerbation or injury? Walker °  °Current Functional Level °Cognition °  Overall Cognitive Status: Within Functional Limits for tasks assessed °General Comments: cog was WFL for tasks assessed °   °Extremity Assessment °(includes Sensation/Coordination) °  Upper Extremity Assessment: Generalized weakness  °Lower Extremity Assessment: Defer to PT evaluation  °   °ADLs °  Overall ADL's : Needs assistance/impaired °Eating/Feeding: Minimal assistance, Sitting °Grooming: Moderate assistance, Bed level °Upper Body Bathing: Moderate assistance, Bed level °Lower Body Bathing: Maximal assistance, +2 for physical assistance, Bed level °Upper Body Dressing : Moderate assistance, Sitting °Lower Body Dressing: Maximal assistance, +2 for physical assistance, Bed level °Toilet Transfer: +2 for physical assistance, Requires drop arm, Moderate assistance, Squat-pivot °Toilet Transfer Details (indicate cue type and reason): simulated to chair °Toileting- Clothing Manipulation and Hygiene: Maximal assistance, Bed level °Toileting - Clothing Manipulation Details (indicate cue type and reason): in/out cath at baseline °Functional mobility during ADLs: Maximal assistance, +2 for physical assistance, +2 for safety/equipment °General ADL  Comments: ADL limited to bed level at this time for safety. Pt limited by lethargy, fatigue, activity tolerance BLE weakness and poor sitting balance  °   °Mobility °  Overal bed mobility: Needs Assistance °Bed Mobility: Sit to Supine °Rolling: Mod assist °Sidelying to sit: Mod assist °Sit to supine: Min assist °General bed mobility comments: min A to bring BLEs into bed and to repositon shoulders in midline  °   °Transfers °  Overall transfer level: Needs assistance °Equipment used: Rolling walker (2 wheels) °Transfers: Sit to/from Stand, Bed to chair/wheelchair/BSC °Sit to Stand: Min assist °Bed to/from chair/wheelchair/BSC transfer type:: Stand pivot °Stand pivot transfers: Min assist °Squat pivot transfers: Min assist ° Lateral/Scoot Transfers: Min assist °General transfer comment: pt transferred recliner<>bed stand/squat<>pivot with min A, stood from EOB without AD and mod A, then again from EOB with RW and min A - cues for weight shifting anteriorly to correct posterior lean  °   °Ambulation / Gait / Stairs / Wheelchair Mobility °  Ambulation/Gait °Ambulation/Gait assistance: Min assist °Gait Distance (Feet): 10 Feet °Assistive device: Rolling walker (2 wheels) °Gait Pattern/deviations: Step-to pattern, Decreased step length - right, Decreased step length - left, Decreased stride length, Trunk flexed, Narrow base of support °General Gait Details: deferred- safety/lethargy °Gait velocity: decreased °Gait velocity interpretation: <1.31 ft/sec, indicative of household ambulator °Pre-gait activities: standing from EOB  °   °Posture / Balance Dynamic Sitting Balance °Sitting balance - Comments: supervision °Balance °Overall balance assessment: Needs assistance °Sitting-balance support: Feet supported, Bilateral upper extremity supported °Sitting balance-Leahy Scale: Fair °Sitting balance - Comments: supervision °Standing balance support: Single extremity supported (R HHA) °Standing balance-Leahy Scale:  Poor °Standing balance comment: standing with R HHA from therapist - noted posterior lean  °   °Special needs/care consideration Skin intact  °  °Previous Home Environment (from acute therapy documentation) °Living Arrangements: Spouse/significant other °Available Help at Discharge: Family, Available PRN/intermittently °Type of Home: House °Home Layout: One level °Home Access: Stairs to enter °Entrance Stairs-Rails: Can reach both °Entrance Stairs-Number of Steps: 1 STE from garage, no steps in the front °Bathroom Shower/Tub: Walk-in shower °Bathroom Toilet: Handicapped height °Additional Comments: was using QC or rollator before admission °  °Discharge Living Setting °Plans for Discharge Living Setting: Patient's home °Type of Home at Discharge: House °Discharge Home Layout: One level °Discharge Home Access: Stairs to enter °Entrance Stairs-Rails: Can reach   both °Discharge Bathroom Shower/Tub: Walk-in shower °Discharge Bathroom Toilet: Handicapped height °Discharge Bathroom Accessibility: Yes °How Accessible: Accessible via wheelchair, Accessible via walker °Does the patient have any problems obtaining your medications?: No °  °Social/Family/Support Systems °Patient Roles: Spouse °Contact Information: [phone] °Anticipated Caregiver: Andrew Stenglein °Anticipated Caregiver's Contact Information: [phone] °Ability/Limitations of Caregiver: Can provide min A °Caregiver Availability: 24/7 °Discharge Plan Discussed with Primary Caregiver: Yes °Is Caregiver In Agreement with Plan?: Yes °Does Caregiver/Family have Issues with Lodging/Transportation while Pt is in Rehab?: No °  °Goals °Patient/Family Goal for Rehab: PT/ OT Min A-Supervision °Expected length of stay: 10-12 days °Pt/Family Agrees to Admission and willing to participate: Yes °Program Orientation Provided & Reviewed with Pt/Caregiver Including Roles  & Responsibilities: Yes °  °Decrease burden of Care through IP rehab admission: Decrease number of  caregivers and Patient/family education °  °Possible need for SNF placement upon discharge: not anticipated °  °Patient Condition: I have reviewed medical records from [facility] , spoken with CM, and patient, spouse, and family member. I met with patient at the bedside for inpatient rehabilitation assessment.  Patient will benefit from ongoing PT and OT, can actively participate in 3 hours of therapy a day 5 days of the week, and can make measurable gains during the admission.  Patient will also benefit from the coordinated team approach during an Inpatient Acute Rehabilitation admission.  The patient will receive intensive therapy as well as Rehabilitation physician, nursing, social worker, and care management interventions.  Due to safety, skin/wound care, disease management, medication administration, pain management, and patient education the patient requires 24 hour a day rehabilitation nursing.  The patient is currently min-mod A with mobility and basic ADLs.  Discharge setting and therapy post discharge at home with home health is anticipated.  Patient has agreed to participate in the Acute Inpatient Rehabilitation Program and will admit today. °  °Preadmission Screen Completed By:  Laura B Staley, 03/08/2021 8:46 AM °______________________________________________________________________   °Discussed status with Dr. Anallely Rosell  on 1//23   at 930 and received approval for admission today. °  °Admission Coordinator:  Laura B Staley, CCC-SLP, time 1113        /Date 03/08/20  °  °Assessment/Plan: °Diagnosis: Multiple sclerosis and AKI °Does the need for close, 24 hr/day Medical supervision in concert with the patient's rehab needs make it unreasonable for this patient to be served in a less intensive setting? Yes °Co-Morbidities requiring supervision/potential complications: AKI, nausea, vomiting, diarrhea, hypokalemia °Due to bladder management, bowel management, safety, skin/wound care, disease  management, medication administration, pain management, and patient education, does the patient require 24 hr/day rehab nursing? Yes °Does the patient require coordinated care of a physician, rehab nurse, PT, OT to address physical and functional deficits in the context of the above medical diagnosis(es)? Yes °Addressing deficits in the following areas: balance, endurance, locomotion, strength, transferring, bowel/bladder control, bathing, dressing, feeding, grooming, toileting, cognition, and psychosocial support °Can the patient actively participate in an intensive therapy program of at least 3 hrs of therapy 5 days a week? Yes °The potential for patient to make measurable gains while on inpatient rehab is excellent °Anticipated functional outcomes upon discharge from inpatient rehab: modified independent PT, modified independent OT, modified independent SLP °Estimated rehab length of stay to reach the above functional goals is: 10-14 days. Anticipated discharge destination: Home °10. Overall Rehab/Functional Prognosis: excellent °  °  °MD Signature: °Morning Halberg, MD °

## 2021-03-08 NOTE — Progress Notes (Signed)
Inpatient Rehab Admissions Coordinator:  ? ?I have a bed for this Pt. On CIR today. RN may call report to [phone]. ? ?Tiearra Colwell, MS, CCC-SLP ?Rehab Admissions Coordinator  ?[phone] (celll) ?[phone] (office) ?

## 2021-03-08 NOTE — Progress Notes (Signed)
Patient discharged to [facility] as ordered, reported to nurse Christel Mormon.

## 2021-03-08 NOTE — Progress Notes (Shared)
PMR Admission Coordinator Pre-Admission Assessment   Patient: Crystal Park is an 61 y.o., female MRN: [id number] DOB: 04/04/1960 Height: 5' 6" (167.6 cm) Weight: 55.5 kg   Insurance Information HMO: yes    PPO:      PCP:      IPA:      80/20:      OTHER:  PRIMARY: [employer] Managed      Policy#: [id number]      Subscriber: Pt CM Name: Sharen Counter       Phone#: [phone] [id number]     Fax#: [phone]  I received a fax from Luisa Hart  giving auth from 1/2-1/10 with updates due 8/75 Pre-Cert#: [id number]      Employer: n/a Benefits:  Phone #: [phone]     Name: none Eff Date: 05/04/2011 - 03/04/2022  Deductible: does not have individual, family $3,400 ($0 met) OOP Max: does not have individual, family $3,750 ($0 met) CIR: 85% coverage, 15% co-insurance SNF: 85% coverage, 15% co-insurance; limited to 64 visits/cal yr (39 remaining) Outpatient: 85% coverage, 15% co-insurance Home Health:  85% coverage, 15% co-insurance; limited to 80 visits/cal yr (64 remaining), auth required DME: 85% coverage, 15% co-insurance Providers: in network   SECONDARY: Medicare Part A       Policy#: [id number]     Phone#:    Development worker, community:       Phone#:    The Actuary for patients in Inpatient Rehabilitation Facilities with attached Privacy Act [place] Records was provided and verbally reviewed with: Patient   Emergency Contact Information Contact Information       Name Relation Home Work Mobile    Yaquelin, Langelier [phone]   [phone]           Current Medical History  Patient Admitting Diagnosis: AKI, MS Pseudoexacerbation History of Present Illness: Crystal Park is a 61 y.o. female with medical history significant of MS presented to the ED 03/04/21 with n/v and abdominal pain.    During admission, Pt. Experienced progressive weakness, inability  to walk even with walker.  AF, P120, BP ok.  Dehydrated.  Normal lactate,  mild AKI, CO2 12.  WBC 16, Hgb 17 - likely hemoconcentrated.  No UTI.  COVID/flu negative.  Imaging unremarkable.  Treated with IV fluids,fluid changed to bicarb drip, stopped 1/3.    Patient[facility] has been reviewed by the rehabilitation admission coordinator and physician.   Past Medical History      Past Medical History:  Diagnosis Date   Benign paroxysmal positional vertigo 10/12/2014   Constipation     Family history of breast cancer     Headache      history of migaines   History of kidney stones     History of MRSA infection     History of normocytic normochromic anemia     MS (multiple sclerosis) ([place])      diagnosed 9/16   Neurogenic bladder      self-caths   PONV (postoperative nausea and vomiting)        Has the patient had major surgery during 100 days prior to admission? Yes   Family History   family history is not on file.   Current Medications   Current Facility-Administered Medications:    acetaminophen (TYLENOL) tablet 650 mg, 650 mg, Oral, Q6H PRN, 650 mg at 03/07/21 1814 **OR** acetaminophen (TYLENOL) suppository 650 mg, 650 mg, Rectal, Q6H PRN, Blount, Lolita Cram, NP  Dimethyl Fumarate CPDR 240 mg, 1 capsule, Oral, BID, Karmen Bongo, MD, 240 mg at 03/08/21 0027   enoxaparin (LOVENOX) injection 40 mg, 40 mg, Subcutaneous, Q24H, Karmen Bongo, MD, 40 mg at 03/07/21 2009   feeding supplement (ENSURE ENLIVE / ENSURE PLUS) liquid 237 mL, 237 mL, Oral, BID BM, Adhikari, Amrit, MD, 237 mL at 03/07/21 1019   hydrALAZINE (APRESOLINE) injection 5 mg, 5 mg, Intravenous, Q4H PRN, Karmen Bongo, MD   levothyroxine (SYNTHROID) tablet 88 mcg, 88 mcg, Oral, Q0600, Karmen Bongo, MD, 88 mcg at 03/08/21 0017   morphine 2 MG/ML injection 2 mg, 2 mg, Intravenous, Q2H PRN, Karmen Bongo, MD   multivitamin with minerals tablet 1 tablet, 1 tablet, Oral, Daily, Adhikari, Amrit, MD, 1 tablet at 03/07/21 1012   ondansetron (ZOFRAN)  tablet 4 mg, 4 mg, Oral, Q6H PRN, 4 mg at 03/05/21 0939 **OR** ondansetron (ZOFRAN) injection 4 mg, 4 mg, Intravenous, Q6H PRN, Karmen Bongo, MD, 4 mg at 03/05/21 1918   oxybutynin (DITROPAN) tablet 5 mg, 5 mg, Oral, Daily, Karmen Bongo, MD, 5 mg at 03/07/21 1012   oxyCODONE (Oxy IR/ROXICODONE) immediate release tablet 5 mg, 5 mg, Oral, Q4H PRN, Karmen Bongo, MD, 5 mg at 03/05/21 2052   potassium chloride SA (KLOR-CON M) CR tablet 40 mEq, 40 mEq, Oral, Once, Adhikari, Amrit, MD   sodium chloride flush (NS) 0.9 % injection 3 mL, 3 mL, Intravenous, Q12H, Karmen Bongo, MD, 3 mL at 03/07/21 2200   Patients Current Diet:  Diet Order                  Diet regular Room service appropriate? Yes; Fluid consistency: Thin  Diet effective now                         Precautions / Restrictions Precautions Precautions: Fall Restrictions Weight Bearing Restrictions: No    Has the patient had 2 or more falls or a fall with injury in the past year? No   Prior Activity Level Limited Community (1-2x/wk): Pt. went out 1-2x a week   Prior Functional Level Self Care: Did the patient need help bathing, dressing, using the toilet or eating? Independent   Indoor Mobility: Did the patient need assistance with walking from room to room (with or without device)? Independent   Stairs: Did the patient need assistance with internal or external stairs (with or without device)? Independent   Functional Cognition: Did the patient need help planning regular tasks such as shopping or remembering to take medications? Needed some help   Patient Information Are you of Hispanic, Latino/a,or Spanish origin?: A. No, not of Hispanic, Latino/a, or Spanish origin What is your race?: A. White Do you need or want an interpreter to communicate with a doctor or health care staff?: 0. No   Patient's Response To:  Health Literacy and Transportation Is the patient able to respond to health literacy and  transportation needs?: Yes Health Literacy - How often do you need to have someone help you when you read instructions, pamphlets, or other written material from your doctor or pharmacy?: Never In the past 12 months, has lack of transportation kept you from medical appointments or from getting medications?: No In the past 12 months, has lack of transportation kept you from meetings, work, or from getting things needed for daily living?: No   Development worker, international aid / [place]: Grab bars - tub/shower, Civil engineer, contracting, Conservation officer, nature (2 wheels), Chartered certified accountant, [place] (  4 wheels), Cane - quad   Prior Device Use: Indicate devices/aids used by the patient prior to current illness, exacerbation or injury? Walker   Current Functional Level Cognition   Overall Cognitive Status: Within Functional Limits for tasks assessed General Comments: cog was [facility] for tasks assessed    Extremity Assessment (includes Sensation/Coordination)   Upper Extremity Assessment: Generalized weakness  Lower Extremity Assessment: Defer to PT evaluation     ADLs   Overall ADL's : Needs assistance/impaired Eating/Feeding: Minimal assistance, Sitting Grooming: Moderate assistance, Bed level Upper Body Bathing: Moderate assistance, Bed level Lower Body Bathing: Maximal assistance, +2 for physical assistance, Bed level Upper Body Dressing : Moderate assistance, Sitting Lower Body Dressing: Maximal assistance, +2 for physical assistance, Bed level Toilet Transfer: +2 for physical assistance, Requires drop arm, Moderate assistance, Squat-pivot Toilet Transfer Details (indicate cue type and reason): simulated to chair Toileting- Clothing Manipulation and Hygiene: Maximal assistance, Bed level Toileting - Clothing Manipulation Details (indicate cue type and reason): in/out cath at baseline Functional mobility during ADLs: Maximal assistance, +2 for physical assistance, +2 for safety/equipment General ADL  Comments: ADL limited to bed level at this time for safety. Pt limited by lethargy, fatigue, activity tolerance BLE weakness and poor sitting balance     Mobility   Overal bed mobility: Needs Assistance Bed Mobility: Sit to Supine Rolling: Mod assist Sidelying to sit: Mod assist Sit to supine: Min assist General bed mobility comments: min A to bring BLEs into bed and to repositon shoulders in midline     Transfers   Overall transfer level: Needs assistance Equipment used: Rolling walker (2 wheels) Transfers: Sit to/from Stand, Bed to chair/wheelchair/BSC Sit to Stand: Min assist Bed to/from chair/wheelchair/BSC transfer type:: Stand pivot Stand pivot transfers: Min assist Squat pivot transfers: Min assist  Lateral/Scoot Transfers: Min assist General transfer comment: pt transferred recliner<>bed stand/squat<>pivot with min A, stood from EOB without AD and mod A, then again from EOB with RW and min A - cues for weight shifting anteriorly to correct posterior lean     Ambulation / Gait / Stairs / Wheelchair Mobility   Ambulation/Gait Ambulation/Gait assistance: Herbalist (Feet): 10 Feet Assistive device: Rolling walker (2 wheels) Gait Pattern/deviations: Step-to pattern, Decreased step length - right, Decreased step length - left, Decreased stride length, Trunk flexed, Narrow base of support General Gait Details: deferred- safety/lethargy Gait velocity: decreased Gait velocity interpretation: <1.31 ft/sec, indicative of household ambulator Pre-gait activities: standing from EOB     Posture / Balance Dynamic Sitting Balance Sitting balance - Comments: supervision Balance Overall balance assessment: Needs assistance Sitting-balance support: Feet supported, Bilateral upper extremity supported Sitting balance-Leahy Scale: Fair Sitting balance - Comments: supervision Standing balance support: Single extremity supported (R HHA) Standing balance-Leahy Scale:  Poor Standing balance comment: standing with R HHA from therapist - noted posterior lean     Special needs/care consideration Skin intact    Previous Home Environment (from acute therapy documentation) Living Arrangements: Spouse/significant other Available Help at Discharge: Family, Available PRN/intermittently Type of Home: House Home Layout: One level Home Access: Stairs to enter Entrance Stairs-Rails: Can reach both Entrance Stairs-Number of Steps: 1 STE from garage, no steps in the front Bathroom Shower/Tub: Multimedia programmer: Handicapped height Additional Comments: was using QC or rollator before admission   Discharge Living Setting Plans for Discharge Living Setting: Patient's home Type of Home at Discharge: House Discharge Home Layout: One level Discharge Home Access: Stairs to enter Entrance Stairs-Rails: Can reach  both Discharge Bathroom Shower/Tub: Walk-in shower Discharge Bathroom Toilet: Handicapped height Discharge Bathroom Accessibility: Yes How Accessible: Accessible via wheelchair, Accessible via walker Does the patient have any problems obtaining your medications?: No   Social/Family/Support Systems Patient Roles: Spouse Contact Information: [phone] Anticipated Caregiver: Larwance Rote Anticipated Caregiver's Contact Information: [phone] Ability/Limitations of Caregiver: Can provide min A Caregiver Availability: 24/7 Discharge Plan Discussed with Primary Caregiver: Yes Is Caregiver In Agreement with Plan?: Yes Does Caregiver/Family have Issues with Lodging/Transportation while Pt is in Rehab?: No   Goals Patient/Family Goal for Rehab: PT/ OT Min A-Supervision Expected length of stay: 10-12 days Pt/Family Agrees to Admission and willing to participate: Yes Program Orientation Provided & Reviewed with Pt/Caregiver Including Roles  & Responsibilities: Yes   Decrease burden of Care through IP rehab admission: Decrease number of  caregivers and Patient/family education   Possible need for SNF placement upon discharge: not anticipated   Patient Condition: I have reviewed medical records from [facility] , spoken with CM, and patient, spouse, and family member. I met with patient at the bedside for inpatient rehabilitation assessment.  Patient will benefit from ongoing PT and OT, can actively participate in 3 hours of therapy a day 5 days of the week, and can make measurable gains during the admission.  Patient will also benefit from the coordinated team approach during an Inpatient Acute Rehabilitation admission.  The patient will receive intensive therapy as well as Rehabilitation physician, nursing, social worker, and care management interventions.  Due to safety, skin/wound care, disease management, medication administration, pain management, and patient education the patient requires 24 hour a day rehabilitation nursing.  The patient is currently min-mod A with mobility and basic ADLs.  Discharge setting and therapy post discharge at home with home health is anticipated.  Patient has agreed to participate in the Acute Inpatient Rehabilitation Program and will admit today.   Preadmission Screen Completed By:  Genella Mech, 03/08/2021 8:46 AM ______________________________________________________________________   Discussed status with Dr. Ranell Patrick  on 1//23   at 67 and received approval for admission today.   Admission Coordinator:  Danise Edge, time 6945        /Date 03/08/20    Assessment/Plan: Diagnosis: Multiple sclerosis and AKI Does the need for close, 24 hr/day Medical supervision in concert with the patient's rehab needs make it unreasonable for this patient to be served in a less intensive setting? Yes Co-Morbidities requiring supervision/potential complications: AKI, nausea, vomiting, diarrhea, hypokalemia Due to bladder management, bowel management, safety, skin/wound care, disease  management, medication administration, pain management, and patient education, does the patient require 24 hr/day rehab nursing? Yes Does the patient require coordinated care of a physician, rehab nurse, PT, OT to address physical and functional deficits in the context of the above medical diagnosis(es)? Yes Addressing deficits in the following areas: balance, endurance, locomotion, strength, transferring, bowel/bladder control, bathing, dressing, feeding, grooming, toileting, cognition, and psychosocial support Can the patient actively participate in an intensive therapy program of at least 3 hrs of therapy 5 days a week? Yes The potential for patient to make measurable gains while on inpatient rehab is excellent Anticipated functional outcomes upon discharge from inpatient rehab: modified independent PT, modified independent OT, modified independent SLP Estimated rehab length of stay to reach the above functional goals is: 10-14 days. Anticipated discharge destination: Home 10. Overall Rehab/Functional Prognosis: excellent     MD Signature: Leeroy Cha, MD

## 2021-03-08 NOTE — Progress Notes (Signed)
Inpatient Rehabilitation Admission Medication Review by a Pharmacist  A complete drug regimen review was completed for this patient to identify any potential clinically significant medication issues.  High Risk Drug Classes Is patient taking? Indication by Medication  Antipsychotic Yes Compazine for N/V  Anticoagulant Yes Lovenox for VTE ppx  Antibiotic Yes PO Keflex to complete course for UTI  Opioid No   Antiplatelet No   Hypoglycemics/insulin No   Vasoactive Medication No   Chemotherapy No   Other Yes Dimethyl Fumarate for MS Oxybutynin for incontinence Synthroid for low thyroid     Type of Medication Issue Identified Description of Issue Recommendation(s)  Drug Interaction(s) (clinically significant)     Duplicate Therapy     Allergy     No Medication Administration End Date     Incorrect Dose     Additional Drug Therapy Needed     Significant med changes from prior encounter (inform family/care partners about these prior to discharge).    Other       Clinically significant medication issues were identified that warrant physician communication and completion of prescribed/recommended actions by midnight of the next day:  No  Pharmacist comments: None  Time spent performing this drug regimen review (minutes):  20 minutes   Elwin Sleight 03/08/2021 3:57 PM

## 2021-03-08 NOTE — H&P (Signed)
Physical Medicine and Rehabilitation Admission H&P    Chief Complaint  Patient presents with   Functional decline    HPI: [place] is a 61 year old female with history of MS dx 2016 w/neurogenic bladder, dizziness and chronic R-sided weakness (Dr. Epimenio Foot), BPVV who was admitted on 03/04/21 with abdominal pain, diarrhea and N/V, poor po intake, weakness and inability to walk. She was noted to be dehydrated with Scr-1.16, lactic acidosis and leucocytosis with WBC-16.4.  She was treated with IVF for viral gastroenteritis and Zofran for nausea. CT abdomen/pelvis was negative for acute abnormality. . Neurology consulted for worsening of right sided weakness and chronic fatigue. MRI brain, cervical and thoracic spine which showed no change in white matter disease and no active demyelination. Dr Selina Cooley felt that patient with pseudoexacerbation of MS symptoms and no further work up needed. UA/UCS showed E coli UTI and she was started on Keflex today.    At baseline, patient was able to ambulate about 100' with cane or HHA, limited by fatigue and dizziness (uses valium bid with some sedative SE), sleeps 10 pm to 11 am then takes a small nap around late afternoon. Caths every 4-5 hours. Goes out occasionally and uses transport chair. Therapy ongoing and patient noted to be deconditioned with weakness affecting functional status. CIR recommended due to functional decline. She has supportive family.    Review of Systems  Constitutional:  Negative for chills and fever.  HENT:  Negative for hearing loss.   Eyes:  Negative for blurred vision and double vision (not any more).  Respiratory:  Negative for cough, shortness of breath and stridor.   Cardiovascular:  Negative for chest pain and leg swelling.  Gastrointestinal:  Positive for abdominal pain. Negative for constipation, heartburn and nausea.  Genitourinary:  Positive for dysuria.  Musculoskeletal:  Positive for joint pain (chronic right  knee pain). Negative for myalgias.  Skin:  Negative for rash.  Neurological:  Positive for dizziness, focal weakness (tends to drag left leg when fatigued), weakness and headaches.       Sleeps a lot--likley due to valium bid for dizziness.     Past Medical History:  Diagnosis Date   Benign paroxysmal positional vertigo 10/12/2014   Constipation    Family history of breast cancer    Headache    history of migaines   History of kidney stones    History of MRSA infection    History of normocytic normochromic anemia    MS (multiple sclerosis) (HCC)    diagnosed 9/16   Neurogenic bladder    self-caths   PONV (postoperative nausea and vomiting)     Past Surgical History:  Procedure Laterality Date   BREAST LUMPECTOMY Right    benign   COLONOSCOPY     CYSTOSCOPY WITH INJECTION N/A 04/23/2017   Procedure: CYSTOSCOPY WITH INJECTION/ BOTOX 200 UNITS;  Surgeon: Jerilee Field, MD;  Location: [facility];  Service: Urology;  Laterality: N/A;   OOPHORECTOMY Left    2013   TONSILLECTOMY AND ADENOIDECTOMY     age 79   TRANSURETHRAL RESECTION OF BLADDER TUMOR N/A 04/23/2017   Procedure: TRANSURETHRAL RESECTION OF BLADDER TUMOR (TURBT);  Surgeon: Jerilee Field, MD;  Location: [facility];  Service: Urology;  Laterality: N/A;    Family History  Problem Relation Age of Onset   Cancer Mother    Cancer Father    Diabetes Sister      Social History: Married. Disabled Runner, broadcasting/film/video.  She  reports that she has never smoked. She has never used smokeless tobacco. She reports that she does not currently use alcohol. She reports that she does not use drugs.   Allergies  Allergen Reactions   Adderall [Amphetamine-Dextroamphetamine] Other (See Comments)    constipation    Medications Prior to Admission  Medication Sig Dispense Refill   alendronate (FOSAMAX) 70 MG tablet Take 70 mg by mouth once a week. Take with a full glass of water on an empty stomach.      aspirin-acetaminophen-caffeine (EXCEDRIN MIGRAINE) 250-250-65 MG tablet Take 2 tablets by mouth every 6 (six) hours as needed for headache.     bismuth subsalicylate (PEPTO BISMOL) 262 MG chewable tablet Chew 524 mg by mouth as needed for indigestion or diarrhea or loose stools.     cephALEXin (KEFLEX) 500 MG capsule Take 1 capsule (500 mg total) by mouth every 8 (eight) hours for 5 days. 15 capsule 0   CRANBERRY PO Take 1 capsule by mouth daily.     diazepam (VALIUM) 5 MG tablet Take up to three times a day po prn dizziness (Patient taking differently: Take 5 mg by mouth 3 (three) times daily as needed (dizziness).) 270 tablet 1   Dimethyl Fumarate 240 MG CPDR TAKE 1 CAPSULE TWICE A DAY (Patient taking differently: Take 240 mg by mouth 2 (two) times daily.) 60 capsule 11   levothyroxine (SYNTHROID) 88 MCG tablet Take 88 mcg by mouth daily before breakfast.     Multiple Vitamin (MULTIVITAMIN) tablet Take 1 tablet by mouth daily.     oxybutynin (DITROPAN) 5 MG tablet Take 5 mg by mouth daily.      Drug Regimen Review  Drug regimen was reviewed and remains appropriate with no significant issues identified  Home: Home Living Family/patient expects to be discharged to:: Private residence Living Arrangements: Spouse/significant other Available Help at Discharge: Family, Available PRN/intermittently Type of Home: House Home Access: Stairs to enter [employer] of Steps: 1 STE from garage, no steps in the front Entrance Stairs-Rails: Can reach both Home Layout: One level Bathroom Shower/Tub: Health visitor: Handicapped height Home Equipment: Grab bars - tub/shower, Information systems manager, Agricultural consultant (2 wheels), Firefighter, Rollator (4 wheels), [employer] - quad Additional Comments: was using QC or rollator before admission   Functional History: Prior Function Prior Level of Function : Independent/Modified Independent Mobility Comments: using DME was able to get anywhere-  house hold distances; not driving; had one fall recently on past tuesday, slid off a chair ADLs Comments: dressing/bathing- was independent   Functional Status:  Mobility: Bed Mobility Overal bed mobility: Needs Assistance Bed Mobility: Sit to Supine Rolling: Mod assist Sidelying to sit: Mod assist Sit to supine: Min assist General bed mobility comments: min A to bring BLEs into bed and to repositon shoulders in midline Transfers Overall transfer level: Needs assistance Equipment used: Rolling walker (2 wheels) Transfers: Sit to/from Stand, Bed to chair/wheelchair/BSC Sit to Stand: Min assist Bed to/from chair/wheelchair/BSC transfer type:: Stand pivot Stand pivot transfers: Min assist Squat pivot transfers: Min assist  Lateral/Scoot Transfers: Min assist General transfer comment: pt transferred recliner<>bed stand/squat<>pivot with min A, stood from EOB without AD and mod A, then again from EOB with RW and min A - cues for weight shifting anteriorly to correct posterior lean Ambulation/Gait Ambulation/Gait assistance: Min assist Gait Distance (Feet): 10 Feet Assistive device: Rolling walker (2 wheels) Gait Pattern/deviations: Step-to pattern, Decreased step length - right, Decreased step length - left, Decreased  stride length, Trunk flexed, Narrow base of support General Gait Details: deferred- safety/lethargy Gait velocity: decreased Gait velocity interpretation: <1.31 ft/sec, indicative of household ambulator Pre-gait activities: standing from EOB   ADL: ADL Overall ADL's : Needs assistance/impaired Eating/Feeding: Minimal assistance, Sitting Grooming: Moderate assistance, Bed level Upper Body Bathing: Moderate assistance, Bed level Lower Body Bathing: Maximal assistance, +2 for physical assistance, Bed level Upper Body Dressing : Moderate assistance, Sitting Lower Body Dressing: Maximal assistance, +2 for physical assistance, Bed level Toilet Transfer: +2 for physical  assistance, Requires drop arm, Moderate assistance, Squat-pivot Toilet Transfer Details (indicate cue type and reason): simulated to chair Toileting- Clothing Manipulation and Hygiene: Maximal assistance, Bed level Toileting - Clothing Manipulation Details (indicate cue type and reason): in/out cath at baseline Functional mobility during ADLs: Maximal assistance, +2 for physical assistance, +2 for safety/equipment General ADL Comments: ADL limited to bed level at this time for safety. Pt limited by lethargy, fatigue, activity tolerance BLE weakness and poor sitting balance   Cognition: Cognition Overall Cognitive Status: Within Functional Limits for tasks assessed Cognition Arousal/Alertness: Awake/alert Behavior During Therapy: Flat affect Overall Cognitive Status: Within Functional Limits for tasks assessed General Comments: cog was [facility] for tasks assessed    Blood pressure 101/72, pulse (!) 108, temperature 100 F (37.8 C), temperature source Oral, resp. rate 20, height  (1.676 m), weight 56.4 kg, SpO2 95 %. Physical Exam Gen: no distress, normal appearing HEENT: oral mucosa pink and moist, NCAT Cardio: Tachycardia Chest: normal effort, normal rate of breathing Abd: soft, non-distended Ext: no edema Psych: pleasant, normal affect Skin: intact Neuro: Musculoskeletal:  Neurological:     Comments: Flat affect with soft voice. Nystagmus right lateral vision. Right sided weakness with tight heel cord. 2+DTRs. RUE 3/5, LUE 4/5, RLE 3/5 HF and otherwise 4/5, LLE 4/5. Sensation intact.   Results for orders placed or performed during the hospital encounter of 03/03/21 (from the past 48 hour(s))  Basic metabolic panel     Status: Abnormal   Collection Time: 03/07/21  3:46 AM  Result Value Ref Range   Sodium 139 135 - 145 mmol/L   Potassium 3.1 (L) 3.5 - 5.1 mmol/L   Chloride 96 (L) 98 - 111 mmol/L   CO2 31 22 - 32 mmol/L   Glucose, Bld 102 (H) 70 - 99 mg/dL    Comment: Glucose  reference range applies only to samples taken after fasting for at least 8 hours.   BUN 6 6 - 20 mg/dL   Creatinine, Ser 1.61 0.44 - 1.00 mg/dL   Calcium 8.7 (L) 8.9 - 10.3 mg/dL   GFR, Estimated >09 >60 mL/min    Comment: (NOTE) Calculated using the CKD-EPI Creatinine Equation (2021)    Anion gap 12 5 - 15    Comment: Performed at [facility], [address], [place], [place] [address]  Magnesium     Status: None   Collection Time: 03/07/21  3:46 AM  Result Value Ref Range   Magnesium 2.0 1.7 - 2.4 mg/dL    Comment: Performed at [facility], [address], [place], [place] [address]  Basic metabolic panel     Status: Abnormal   Collection Time: 03/08/21  2:20 AM  Result Value Ref Range   Sodium 138 135 - 145 mmol/L   Potassium 3.2 (L) 3.5 - 5.1 mmol/L   Chloride 101 98 - 111 mmol/L   CO2 30 22 - 32 mmol/L   Glucose, Bld 134 (H) 70 -  99 mg/dL    Comment: Glucose reference range applies only to samples taken after fasting for at least 8 hours.   BUN 15 6 - 20 mg/dL   Creatinine, Ser 4.08 0.44 - 1.00 mg/dL   Calcium 9.5 8.9 - 14.4 mg/dL   GFR, Estimated >81 >85 mL/min    Comment: (NOTE) Calculated using the CKD-EPI Creatinine Equation (2021)    Anion gap 7 5 - 15    Comment: Performed at [facility], [address] [place], [place] [address]  TSH     Status: None   Collection Time: 03/08/21 11:14 AM  Result Value Ref Range   TSH 1.582 0.350 - 4.500 uIU/mL    Comment: Performed by a 3rd Generation assay with a functional sensitivity of <=0.01 uIU/mL. Performed at [facility], [address] [place], [place] [address]    No results found.     Medical Problem List and Plan: 1. Functional deficits secondary to MS/AKI  -patient may shower  -ELOS/Goals: 10-14 days modI 2.  Impaired mobility: Continue  -antiplatelet therapy: N/A.  3. Headaches: Resume Excedrin prn for HA.  --oxycodone prn.  4. Mood: LCSW to follow for evaluation and support.    -antipsychotic agents: N/A 5. Neuropsych: This patient is capable of making decisions on her own behalf. 6. Skin/Wound Care: Routine pressure relief measures.  7. Fluids/Electrolytes/Nutrition: Monitor I/O. Intake poor and variable at baseline per family. 8. E coli UTI: On keflex D #1/7-10  --pyridium added to help with urethritis.  9. Neurogenic bladder: Will schedule every 4 hours caths as at home to prevent spasms/incontinence.   10. Hypokalemia: Question etiology--has been supplemented for the past 2 days.  --recheck level in am. 11. Leucocytosis: Likely due to UTI--recheck CBC in am.  12. Chronic fatigue: Ampyra ineffective and unable to tolerate stimulants due to SE.  13. Tachycardia: TSH normal : start propanolol 10mg  HS  I have personally performed a face to face diagnostic evaluation, including, but not limited to relevant history and physical exam findings, of this patient and developed relevant assessment and plan.  Additionally, I have reviewed and concur with the physician assistant's documentation above.  , PA-C   Jacquelynn Cree, MD 03/08/2021

## 2021-03-08 NOTE — H&P (Shared)
Physical Medicine and Rehabilitation Admission H&P    Chief Complaint  Patient presents with   Functional decline    HPI: [place]. Burgner is a 61 year old female with history of MS dx 2016 w/neurogenic bladder, dizziness and chronic R-sided weakness (Dr. Epimenio Foot), BPVV who was admitted on 03/04/21 with abdominal pain, diarrhea and N/V, poor po intake, weakness and inability to walk. She was noted to be dehydrated with Scr-1.16, lactic acidosis and leucocytosis with WBC-16.4.  She was treated with IVF for viral gastroenteritis and Zofran for nausea. CT abdomen/pelvis was negative for acute abnormality. . Neurology consulted for worsening of right sided weakness and chronic fatigue. MRI brain, cervical and thoracic spine which showed no change in white matter disease and no active demyelination. Dr Selina Cooley felt that patient with pseudoexacerbation of MS symptoms and no further work up needed. UA/UCS showed E coli UTI and she was started on Keflex today.    At baseline, patient was able to ambulate about 100' with cane or HHA, limited by fatigue and dizziness (uses valium bid with some sedative SE), sleeps 10 pm to 11 am then takes a small nap around late afternoon. Caths every 4-5 hours. Goes out occasionally and uses transport chair. Therapy ongoing and patient noted to be deconditioned with weakness affecting functional status. CIR recommended due to functional decline.    Review of Systems  Constitutional:  Negative for chills and fever.  HENT:  Negative for hearing loss.   Eyes:  Negative for blurred vision and double vision (not any more).  Respiratory:  Negative for cough, shortness of breath and stridor.   Cardiovascular:  Negative for chest pain and leg swelling.  Gastrointestinal:  Positive for abdominal pain. Negative for constipation, heartburn and nausea.  Genitourinary:  Positive for dysuria.  Musculoskeletal:  Positive for joint pain (chronic right knee pain). Negative for  myalgias.  Skin:  Negative for rash.  Neurological:  Positive for dizziness, focal weakness (tends to drag left leg when fatigued), weakness and headaches.       Sleeps a lot--likley due to valium bid for dizziness.     Past Medical History:  Diagnosis Date   Benign paroxysmal positional vertigo 10/12/2014   Constipation    Family history of breast cancer    Headache    history of migaines   History of kidney stones    History of MRSA infection    History of normocytic normochromic anemia    MS (multiple sclerosis) (HCC)    diagnosed 9/16   Neurogenic bladder    self-caths   PONV (postoperative nausea and vomiting)     Past Surgical History:  Procedure Laterality Date   BREAST LUMPECTOMY Right    benign   COLONOSCOPY     CYSTOSCOPY WITH INJECTION N/A 04/23/2017   Procedure: CYSTOSCOPY WITH INJECTION/ BOTOX 200 UNITS;  Surgeon: Jerilee Field, MD;  Location: [facility];  Service: Urology;  Laterality: N/A;   OOPHORECTOMY Left    2013   TONSILLECTOMY AND ADENOIDECTOMY     age 42   TRANSURETHRAL RESECTION OF BLADDER TUMOR N/A 04/23/2017   Procedure: TRANSURETHRAL RESECTION OF BLADDER TUMOR (TURBT);  Surgeon: Jerilee Field, MD;  Location: [facility];  Service: Urology;  Laterality: N/A;    Family History  Problem Relation Age of Onset   Cancer Mother    Cancer Father    Diabetes Sister      Social History: Married. Disabled Runner, broadcasting/film/video. She  reports that  she has never smoked. She has never used smokeless tobacco. She reports that she does not currently use alcohol. She reports that she does not use drugs.   Allergies  Allergen Reactions   Adderall [Amphetamine-Dextroamphetamine] Other (See Comments)    constipation    Medications Prior to Admission  Medication Sig Dispense Refill   alendronate (FOSAMAX) 70 MG tablet Take 70 mg by mouth once a week. Take with a full glass of water on an empty stomach.      aspirin-acetaminophen-caffeine (EXCEDRIN MIGRAINE) 250-250-65 MG tablet Take 2 tablets by mouth every 6 (six) hours as needed for headache.     bismuth subsalicylate (PEPTO BISMOL) 262 MG chewable tablet Chew 524 mg by mouth as needed for indigestion or diarrhea or loose stools.     cephALEXin (KEFLEX) 500 MG capsule Take 1 capsule (500 mg total) by mouth every 8 (eight) hours for 5 days. 15 capsule 0   CRANBERRY PO Take 1 capsule by mouth daily.     diazepam (VALIUM) 5 MG tablet Take up to three times a day po prn dizziness (Patient taking differently: Take 5 mg by mouth 3 (three) times daily as needed (dizziness).) 270 tablet 1   Dimethyl Fumarate 240 MG CPDR TAKE 1 CAPSULE TWICE A DAY (Patient taking differently: Take 240 mg by mouth 2 (two) times daily.) 60 capsule 11   levothyroxine (SYNTHROID) 88 MCG tablet Take 88 mcg by mouth daily before breakfast.     Multiple Vitamin (MULTIVITAMIN) tablet Take 1 tablet by mouth daily.     oxybutynin (DITROPAN) 5 MG tablet Take 5 mg by mouth daily.      Drug Regimen Review { DRUG REGIMEN [id number]}  Home: Home Living Family/patient expects to be discharged to:: Private residence Living Arrangements: Spouse/significant other Available Help at Discharge: Family, Available PRN/intermittently Type of Home: House Home Access: Stairs to enter [employer] of Steps: 1 STE from garage, no steps in the front Entrance Stairs-Rails: Can reach both Home Layout: One level Bathroom Shower/Tub: Health visitor: Handicapped height Home Equipment: Grab bars - tub/shower, Information systems manager, Agricultural consultant (2 wheels), Firefighter, Rollator (4 wheels), [employer] - quad Additional Comments: was using QC or rollator before admission   Functional History: Prior Function Prior Level of Function : Independent/Modified Independent Mobility Comments: using DME was able to get anywhere- house hold distances; not driving; had one fall recently on  past tuesday, slid off a chair ADLs Comments: dressing/bathing- was independent  Functional Status:  Mobility: Bed Mobility Overal bed mobility: Needs Assistance Bed Mobility: Sit to Supine Rolling: Mod assist Sidelying to sit: Mod assist Sit to supine: Min assist General bed mobility comments: min A to bring BLEs into bed and to repositon shoulders in midline Transfers Overall transfer level: Needs assistance Equipment used: Rolling walker (2 wheels) Transfers: Sit to/from Stand, Bed to chair/wheelchair/BSC Sit to Stand: Min assist Bed to/from chair/wheelchair/BSC transfer type:: Stand pivot Stand pivot transfers: Min assist Squat pivot transfers: Min assist  Lateral/Scoot Transfers: Min assist General transfer comment: pt transferred recliner<>bed stand/squat<>pivot with min A, stood from EOB without AD and mod A, then again from EOB with RW and min A - cues for weight shifting anteriorly to correct posterior lean Ambulation/Gait Ambulation/Gait assistance: Min assist Gait Distance (Feet): 10 Feet Assistive device: Rolling walker (2 wheels) Gait Pattern/deviations: Step-to pattern, Decreased step length - right, Decreased step length - left, Decreased stride length, Trunk flexed, Narrow base of support General Gait Details: deferred- safety/lethargy Gait  velocity: decreased Gait velocity interpretation: <1.31 ft/sec, indicative of household ambulator Pre-gait activities: standing from EOB    ADL: ADL Overall ADL's : Needs assistance/impaired Eating/Feeding: Minimal assistance, Sitting Grooming: Moderate assistance, Bed level Upper Body Bathing: Moderate assistance, Bed level Lower Body Bathing: Maximal assistance, +2 for physical assistance, Bed level Upper Body Dressing : Moderate assistance, Sitting Lower Body Dressing: Maximal assistance, +2 for physical assistance, Bed level Toilet Transfer: +2 for physical assistance, Requires drop arm, Moderate assistance,  Squat-pivot Toilet Transfer Details (indicate cue type and reason): simulated to chair Toileting- Clothing Manipulation and Hygiene: Maximal assistance, Bed level Toileting - Clothing Manipulation Details (indicate cue type and reason): in/out cath at baseline Functional mobility during ADLs: Maximal assistance, +2 for physical assistance, +2 for safety/equipment General ADL Comments: ADL limited to bed level at this time for safety. Pt limited by lethargy, fatigue, activity tolerance BLE weakness and poor sitting balance  Cognition: Cognition Overall Cognitive Status: Within Functional Limits for tasks assessed Cognition Arousal/Alertness: Awake/alert Behavior During Therapy: Flat affect Overall Cognitive Status: Within Functional Limits for tasks assessed General Comments: cog was [facility] for tasks assessed   Blood pressure 132/89, pulse (!) 108, temperature 99 F (37.2 C), temperature source Oral, resp. rate 18, height 5\' 6"  (1.676 m), weight 55.5 kg, SpO2 95 %. Physical Exam Neurological:     Comments: Flat affect with soft voice. Nystagmus right lateral vision. Right sided weakness with tight heel cord. 2+DTRs    Results for orders placed or performed during the hospital encounter of 03/03/21 (from the past 48 hour(s))  Basic metabolic panel     Status: Abnormal   Collection Time: 03/07/21  3:46 AM  Result Value Ref Range   Sodium 139 135 - 145 mmol/L   Potassium 3.1 (L) 3.5 - 5.1 mmol/L   Chloride 96 (L) 98 - 111 mmol/L   CO2 31 22 - 32 mmol/L   Glucose, Bld 102 (H) 70 - 99 mg/dL    Comment: Glucose reference range applies only to samples taken after fasting for at least 8 hours.   BUN 6 6 - 20 mg/dL   Creatinine, Ser 1.57 0.44 - 1.00 mg/dL   Calcium 8.7 (L) 8.9 - 10.3 mg/dL   GFR, Estimated >26 >20 mL/min    Comment: (NOTE) Calculated using the CKD-EPI Creatinine Equation (2021)    Anion gap 12 5 - 15    Comment: Performed at [facility], [address]  [place], [place] [address]  Magnesium     Status: None   Collection Time: 03/07/21  3:46 AM  Result Value Ref Range   Magnesium 2.0 1.7 - 2.4 mg/dL    Comment: Performed at [facility], [address] [place], [place] [address]  Basic metabolic panel     Status: Abnormal   Collection Time: 03/08/21  2:20 AM  Result Value Ref Range   Sodium 138 135 - 145 mmol/L   Potassium 3.2 (L) 3.5 - 5.1 mmol/L   Chloride 101 98 - 111 mmol/L   CO2 30 22 - 32 mmol/L   Glucose, Bld 134 (H) 70 - 99 mg/dL    Comment: Glucose reference range applies only to samples taken after fasting for at least 8 hours.   BUN 15 6 - 20 mg/dL   Creatinine, Ser 4.53 0.44 - 1.00 mg/dL   Calcium 9.5 8.9 - 64.6 mg/dL   GFR, Estimated >80 >32 mL/min    Comment: (NOTE) Calculated using the CKD-EPI Creatinine  Equation (2021)    Anion gap 7 5 - 15    Comment: Performed at [facility], [address], [place], [place] [address]  TSH     Status: None   Collection Time: 03/08/21 11:14 AM  Result Value Ref Range   TSH 1.582 0.350 - 4.500 uIU/mL    Comment: Performed by a 3rd Generation assay with a functional sensitivity of <=0.01 uIU/mL. Performed at [facility], [address] [place], [place] [address]    No results found.     Medical Problem List and Plan: 1. Functional deficits secondary to ***  -patient may *** shower  -ELOS/Goals: *** 2.  Antithrombotics: -DVT/anticoagulation:  Pharmaceutical: Lovenox  -antiplatelet therapy: N/A.  3. Headaches/Pain Management: Will resume Excedrin prn for HA.  --oxycodone prn.  4. Mood: LCSW to follow for evaluation and support.   -antipsychotic agents: N/A 5. Neuropsych: This patient is capable of making decisions on her own behalf. 6. Skin/Wound Care: Routine pressure relief measures.  7. Fluids/Electrolytes/Nutrition: Monitor I/O. Intake poor and variable at baseline per family. 8. E coli UTI: On keflex D #1/7-10  --pyridium added to help with  urethritis.  9. Neurogenic bladder: Will schedule every 4 hours caths as at home to prevent spasms/incontinence.   10. Hypokalemia: Question etiology--has been supplemented for the past 2 days.  --recheck level in am. 11. Leucocytosis: Likely due to UTI--recheck CBC in am.  12. Chronic fatigue: Ampyra ineffective and unable to tolerate stimulants due to SE.    ***  Jacquelynn Cree, PA-C 03/08/2021

## 2021-03-08 NOTE — Discharge Summary (Signed)
Physician Discharge Summary  [place] [id number] DOB: 05/23/1960 DOA: 03/03/2021  PCP: Lawerance Cruel, MD  Admit date: 03/03/2021 Discharge date: 03/08/2021  Admitted From: Home Disposition:  CIR  Discharge Condition:Stable CODE STATUS:FULL Diet recommendation:  Regular   Brief/Interim Summary:  Patient is a 61 year old female with history of multiple sclerosis who presented with nausea, vomiting, abdominal pain, weakness, inability to ambulate.  Reported loose stools at home, poor appetite, poor oral intake.  On presentation she was found to be dehydrated.  Lab work showed leukocytosis, mild AKI, anion gap metabolic acidosis with CO2 of 12.  She was admitted for further work-up.  Neurology consulted for suspicion of flare of multiple sclerosis.  MRI of the brain, cervical and thoracic spine did not show any evidence of acute demyelination and showed unchanged white matter disease.  PT/OT consulted and recommended CIR on discharge.  Hospital course remarkable for persistent weakness.  Urine culture showed UTI. medically stable for discharge to CIR.  Following problems were addressed during her hospitalization;    AKI: Likely secondary to decreased oral intake, nausea, vomiting and diarrhea.  Lab work showed normal anion gap metabolic acidosis with CO2 of 12 on presentation, likely from severe diarrhea..  Treated with IV fluids,fluid changed to bicarb drip,now stopped.  Currently kidney function at baseline.   Nausea, vomiting, diarrhea: Most likely secondary to viral illness.  Symptoms have been better now.Diarrhoea stopped.  Continue supportive care.  CT abdomen/pelvis did not show any acute intra-abdominal abnormalities   Multiple sclerosis/generalized weakness: Generalized weakness is most likely secondary to dehydration, poor oral intake. She has history of multiple sclerosis.  Neurology was following.MRI of the brain, cervical and thoracic spine did not show any evidence  of acute demyelination and showed unchanged white matter disease   Neurogenic bladder/dysuria: Patient also has history of neurogenic bladder.  Does self-catheterization every 4 at home.  Continue oxybutynin. Complaints of dysuria now much better.  Urine culture showed pansensitive E. coli, started on Keflex to be continued for 5 days   Hypokalemia: Supplemented with potassium.   Sinus tachycardia: EKG confirmed Sinus tach. Monitor.Checking TSH   Debility/deconditioning: Physical therapy recommending CIR on discharge.    Discharge Diagnoses:  Principal Problem:   AKI (acute kidney injury) ([place]) Active Problems:   MS (multiple sclerosis) ([place])   Generalized weakness   Neurogenic bladder    Discharge Instructions  Discharge Instructions     Diet general   Complete by: As directed    Discharge instructions   Complete by: As directed    1)Please take prescribed meds as instructed   Increase activity slowly   Complete by: As directed       Allergies as of 03/08/2021       Reactions   Adderall [amphetamine-dextroamphetamine] Other (See Comments)   constipation        Medication List     TAKE these medications    alendronate 70 MG tablet Commonly known as: FOSAMAX Take 70 mg by mouth once a week. Take with a full glass of water on an empty stomach.   aspirin-acetaminophen-caffeine 250-250-65 MG tablet Commonly known as: EXCEDRIN MIGRAINE Take 2 tablets by mouth every 6 (six) hours as needed for headache.   bismuth subsalicylate 99991111 MG chewable tablet Commonly known as: PEPTO BISMOL Chew 524 mg by mouth as needed for indigestion or diarrhea or loose stools.   cephALEXin 500 MG capsule Commonly known as: KEFLEX Take 1 capsule (500 mg total) by mouth every 8 (eight)  hours for 5 days.   CRANBERRY PO Take 1 capsule by mouth daily.   diazepam 5 MG tablet Commonly known as: Valium Take up to three times a day po prn dizziness What changed:  how much to  take how to take this when to take this reasons to take this additional instructions   Dimethyl Fumarate 240 MG Cpdr TAKE 1 CAPSULE TWICE A DAY   levothyroxine 88 MCG tablet Commonly known as: SYNTHROID Take 88 mcg by mouth daily before breakfast.   multivitamin tablet Take 1 tablet by mouth daily.   oxybutynin 5 MG tablet Commonly known as: DITROPAN Take 5 mg by mouth daily.        Allergies  Allergen Reactions   Adderall [Amphetamine-Dextroamphetamine] Other (See Comments)    constipation    Consultations: Neurology   Procedures/Studies: CT HEAD WO CONTRAST (5MM)  Result Date: 03/04/2021 CLINICAL DATA:  Altered mental status. EXAM: CT HEAD WITHOUT CONTRAST TECHNIQUE: Contiguous axial images were obtained from the base of the skull through the vertex without intravenous contrast. COMPARISON:  December 12, 2008. FINDINGS: Brain: Mild chronic ischemic white matter disease is noted. Old left cerebellar infarction is noted. No mass effect or midline shift is noted. Ventricular size is within normal limits. There is no evidence of mass lesion, hemorrhage or acute infarction. Vascular: No hyperdense vessel or unexpected calcification. Skull: Normal. Negative for fracture or focal lesion. Sinuses/Orbits: No acute finding. Other: None. IMPRESSION: No acute intracranial abnormality seen. Electronically Signed   By: Marijo Conception M.D.   On: 03/04/2021 12:55   MR BRAIN W WO CONTRAST  Result Date: 03/05/2021 CLINICAL DATA:  Multiple sclerosis.  Leg weakness. EXAM: MRI HEAD WITHOUT AND WITH CONTRAST TECHNIQUE: Multiplanar, multiecho pulse sequences of the brain and surrounding structures were obtained without and with intravenous contrast. CONTRAST:  84mL GADAVIST GADOBUTROL 1 MMOL/ML IV SOLN COMPARISON:  Head CT 03/04/2021 and MRI 01/13/2020 FINDINGS: Brain: There is no evidence of an acute infarct, intracranial hemorrhage, mass, midline shift, or extra-axial fluid collection. There is  mild cerebral atrophy. Numerous T2 hyperintensities are again seen in the juxtacortical, deep, and periventricular white matter bilaterally as well as in the right thalamus and pons, not significantly changed from the prior MRI. The periventricular white matter is involved to the greatest extent with multiple lesions oriented perpendicularly to the lateral ventricles. Corpus callosum involvement and associated volume loss are unchanged. There are multiple black holes on T1 weighted imaging, and some lesions demonstrate mild shine through on diffusion-weighted imaging. No enhancing lesions are identified. Left cerebellar encephalomalacia is unchanged. Slightly prominent smooth dural enhancement over both cerebral convexities is unchanged. Vascular: Major intracranial vascular flow voids are preserved. Skull and upper cervical spine: Unremarkable bone marrow signal. Sinuses/Orbits: Unremarkable orbits. Paranasal sinuses and mastoid air cells are clear. Other: None. IMPRESSION: Unchanged white matter disease consistent with multiple sclerosis. No evidence of active demyelination or other acute intracranial abnormality. Electronically Signed   By: Logan Bores M.D.   On: 03/05/2021 17:31   MR CERVICAL SPINE W WO CONTRAST  Result Date: 03/05/2021 CLINICAL DATA:  Multiple sclerosis.  Leg weakness. EXAM: MRI CERVICAL SPINE WITHOUT AND WITH CONTRAST TECHNIQUE: Multiplanar and multiecho pulse sequences of the cervical spine, to include the craniocervical junction and cervicothoracic junction, were obtained without and with intravenous contrast. CONTRAST:  96mL GADAVIST GADOBUTROL 1 MMOL/ML IV SOLN COMPARISON:  Cervical spine MRI 04/24/2016 FINDINGS: Alignment: Chronic slight reversal of the normal lordosis in the upper cervical spine. No  significant listhesis. Vertebrae: No fracture, suspicious marrow lesion, or significant marrow edema. Cord: Patchy T2 hyperintense lesions in the cervical spinal cord are similar to the  prior MRI with assessment somewhat limited by motion artifact. No enhancing lesions are present. Posterior Fossa, vertebral arteries, paraspinal tissues: Posterior fossa more fully evaluated on separate brain MRI. Preserved vertebral artery flow voids. Disc levels: Unchanged mild cervical spondylosis with mild disc bulging from C3-4 to C6-7. No significant stenosis. IMPRESSION: 1. Grossly unchanged cervical spinal cord lesions consistent with multiple sclerosis. No evidence of active demyelination. 2. Unchanged mild cervical spondylosis without significant stenosis. Electronically Signed   By: Logan Bores M.D.   On: 03/05/2021 17:21   MR THORACIC SPINE W WO CONTRAST  Result Date: 03/05/2021 CLINICAL DATA:  Multiple sclerosis.  Leg weakness. EXAM: MRI THORACIC WITHOUT AND WITH CONTRAST TECHNIQUE: Multiplanar and multiecho pulse sequences of the thoracic spine were obtained without and with intravenous contrast. CONTRAST:  73mL GADAVIST GADOBUTROL 1 MMOL/ML IV SOLN COMPARISON:  Thoracic spine MRI 04/21/2016 FINDINGS: Alignment:  Normal. Vertebrae: No fracture, suspicious marrow lesion, or significant marrow edema. Cord: Patchy T2 hyperintensity throughout most of the thoracic spinal cord without convincing interval new or progressive lesion. No abnormal enhancement. Paraspinal and other soft tissues: Unremarkable. Disc levels: A large left paracentral disc protrusion at T10-11 results in mild spinal stenosis with mild left-sided cord flattening, unchanged. Widely patent spinal canal and neural foramina elsewhere. IMPRESSION: 1. Similar appearance of widespread patchy thoracic spinal cord T2 signal abnormality consistent with multiple sclerosis. No evidence of active demyelination. 2. Unchanged T10-11 disc protrusion with mild spinal stenosis. Electronically Signed   By: Logan Bores M.D.   On: 03/05/2021 17:30   CT Abdomen Pelvis W Contrast  Result Date: 03/04/2021 CLINICAL DATA:  Nausea, vomiting and lower  abdomen pain for 3 weeks. EXAM: CT ABDOMEN AND PELVIS WITH CONTRAST TECHNIQUE: Multidetector CT imaging of the abdomen and pelvis was performed using the standard protocol following bolus administration of intravenous contrast. CONTRAST:  154mL OMNIPAQUE IOHEXOL 350 MG/ML SOLN COMPARISON:  April 20, 2016 FINDINGS: Lower chest: Minimal dependent atelectasis of posterior lung bases are noted. The heart size normal. Hepatobiliary: No focal liver abnormality is seen. No gallstones, gallbladder wall thickening, or biliary dilatation. Pancreas: Unremarkable. No pancreatic ductal dilatation or surrounding inflammatory changes. Spleen: Normal in size without focal abnormality. Adrenals/Urinary Tract: Adrenal glands are unremarkable. Kidneys are normal, without renal calculi, focal lesion, or hydronephrosis. Bladder is unremarkable. Stomach/Bowel: Stomach is within normal limits. There is a minimal hiatal hernia. Appendix appears normal. No evidence of bowel wall thickening, distention, or inflammatory changes. Vascular/Lymphatic: Aortic atherosclerosis. No enlarged abdominal or pelvic lymph nodes. Reproductive: Uterus and bilateral adnexa are unremarkable. Probable uterine fibroid unchanged. Other: None Musculoskeletal: Stable probable calcified herniated disc at T10-11 is unchanged. IMPRESSION: 1. No acute abnormality identified in the abdomen and pelvis. 2. Aortic atherosclerosis. Aortic Atherosclerosis (ICD10-I70.0). Electronically Signed   By: Abelardo Diesel M.D.   On: 03/04/2021 12:59      Subjective: Patient seen and examined at the bedside this morning.  Hemodynamically stable for discharge today  Discharge Exam: Vitals:   03/07/21 1943 03/08/21 0759  BP: 104/72 132/89  Pulse: (!) 110 (!) 108  Resp: 15 18  Temp: 99.2 F (37.3 C) 99 F (37.2 C)  SpO2: 96% 95%   Vitals:   03/07/21 0432 03/07/21 0747 03/07/21 1943 03/08/21 0759  BP: 138/86 127/85 104/72 132/89  Pulse: (!) 110 (!) 108 (!) 110 Marland Kitchen)  108  Resp: 17 17 15 18   Temp: 98.9 F (37.2 C) 98.3 F (36.8 C) 99.2 F (37.3 C) 99 F (37.2 C)  TempSrc: Oral Oral Oral Oral  SpO2: 100% 97% 96% 95%  Weight:      Height:        General: Pt is alert, awake, not in acute distress Cardiovascular: RRR, S1/S2 +, no rubs, no gallops Respiratory: CTA bilaterally, no wheezing, no rhonchi Abdominal: Soft, NT, ND, bowel sounds + Extremities: no edema, no cyanosis    The results of significant diagnostics from this hospitalization (including imaging, microbiology, ancillary and laboratory) are listed below for reference.     Microbiology: Recent Results (from the past 240 hour(s))  Resp Panel by RT-PCR (Flu A&B, Covid) Nasopharyngeal Swab     Status: None   Collection Time: 03/04/21  9:40 AM   Specimen: Nasopharyngeal Swab; Nasopharyngeal(NP) swabs in vial transport medium  Result Value Ref Range Status   SARS Coronavirus 2 by RT PCR NEGATIVE NEGATIVE Final    Comment: (NOTE) SARS-CoV-2 target nucleic acids are NOT DETECTED.  The SARS-CoV-2 RNA is generally detectable in upper respiratory specimens during the acute phase of infection. The lowest concentration of SARS-CoV-2 viral copies this assay can detect is 138 copies/mL. A negative result does not preclude SARS-Cov-2 infection and should not be used as the sole basis for treatment or other patient management decisions. A negative result may occur with  improper specimen collection/handling, submission of specimen other than nasopharyngeal swab, presence of viral mutation(s) within the areas targeted by this assay, and inadequate number of viral copies(<138 copies/mL). A negative result must be combined with clinical observations, patient history, and epidemiological information. The expected result is Negative.  Fact Sheet for Patients:  EntrepreneurPulse.com.au  Fact Sheet for Healthcare Providers:  IncredibleEmployment.be  This test  is no t yet approved or cleared by the [place] FDA and  has been authorized for detection and/or diagnosis of SARS-CoV-2 by FDA under an Emergency Use Authorization (EUA). This EUA will remain  in effect (meaning this test can be used) for the duration of the COVID-19 declaration under Section 564(b)(1) of the Act, 21 U.S.C.section 360bbb-3(b)(1), unless the authorization is terminated  or revoked sooner.       Influenza A by PCR NEGATIVE NEGATIVE Final   Influenza B by PCR NEGATIVE NEGATIVE Final    Comment: (NOTE) The Xpert Xpress SARS-CoV-2/FLU/RSV plus assay is intended as an aid in the diagnosis of influenza from Nasopharyngeal swab specimens and should not be used as a sole basis for treatment. Nasal washings and aspirates are unacceptable for Xpert Xpress SARS-CoV-2/FLU/RSV testing.  Fact Sheet for Patients: EntrepreneurPulse.com.au  Fact Sheet for Healthcare Providers: IncredibleEmployment.be  This test is not yet approved or cleared by the [place] FDA and has been authorized for detection and/or diagnosis of SARS-CoV-2 by FDA under an Emergency Use Authorization (EUA). This EUA will remain in effect (meaning this test can be used) for the duration of the COVID-19 declaration under Section 564(b)(1) of the Act, 21 U.S.C. section 360bbb-3(b)(1), unless the authorization is terminated or revoked.  Performed at [facility], [place] [address] [place], [place] [address]   Urine Culture     Status: Abnormal   Collection Time: 03/06/21 10:17 AM   Specimen: In/Out Cath Urine  Result Value Ref Range Status   Specimen Description IN/OUT CATH URINE  Final   Special Requests   Final    Immunocompromised Performed at [facility], [place] [address], [place], [place] [address]    Culture >=100,000 COLONIES/mL ESCHERICHIA COLI (A)  Final   Report Status 03/08/2021 FINAL  Final   Organism ID, Bacteria ESCHERICHIA COLI (A)   Final      Susceptibility   Escherichia coli - MIC*    AMPICILLIN 8 SENSITIVE Sensitive     CEFAZOLIN <=4 SENSITIVE Sensitive     CEFEPIME <=0.12 SENSITIVE Sensitive     CEFTRIAXONE <=0.25 SENSITIVE Sensitive     CIPROFLOXACIN <=0.25 SENSITIVE Sensitive     GENTAMICIN <=1 SENSITIVE Sensitive     IMIPENEM <=0.25 SENSITIVE Sensitive     NITROFURANTOIN <=16 SENSITIVE Sensitive     TRIMETH/SULFA <=20 SENSITIVE Sensitive     AMPICILLIN/SULBACTAM <=2 SENSITIVE Sensitive     PIP/TAZO <=4 SENSITIVE Sensitive     * >=100,000 COLONIES/mL ESCHERICHIA COLI     Labs: BNP (last 3 results) No results for input(s): BNP in the last 8760 hours. Basic Metabolic Panel: Recent Labs  Lab 03/03/21 2328 03/05/21 0753 03/07/21 0346 03/08/21 0220  NA 136 141 139 138  K 4.5 4.2 3.1* 3.2*  CL 106 111 96* 101  CO2 12* 18* 31 30  GLUCOSE 126* 101* 102* 134*  BUN 11 8 6 15   CREATININE 1.16* 0.74 0.52 0.51  CALCIUM 9.7 8.8* 8.7* 9.5  MG  --   --  2.0  --    Liver Function Tests: Recent Labs  Lab 03/03/21 2328  AST 18  ALT 22  ALKPHOS 78  BILITOT 1.2  PROT 8.4*  ALBUMIN 4.8   No results for input(s): LIPASE, AMYLASE in the last 168 hours. No results for input(s): AMMONIA in the last 168 hours. CBC: Recent Labs  Lab 03/03/21 2328 03/05/21 0753  WBC 16.4* 11.9*  NEUTROABS 14.2*  --   HGB 17.1* 13.2  HCT 52.5* 40.3  MCV 98.3 96.4  PLT 431* 329   Cardiac Enzymes: Recent Labs  Lab 03/05/21 1658  CKTOTAL 67   BNP: Invalid input(s): POCBNP CBG: No results for input(s): GLUCAP in the last 168 hours. D-Dimer No results for input(s): DDIMER in the last 72 hours. Hgb A1c No results for input(s): HGBA1C in the last 72 hours. Lipid Profile No results for input(s): CHOL, HDL, LDLCALC, TRIG, CHOLHDL, LDLDIRECT in the last 72 hours. Thyroid function studies No results for input(s): TSH, T4TOTAL, T3FREE, THYROIDAB in the last 72 hours.  Invalid input(s): FREET3 Anemia work up No  results for input(s): VITAMINB12, FOLATE, FERRITIN, TIBC, IRON, RETICCTPCT in the last 72 hours. Urinalysis    Component Value Date/Time   COLORURINE YELLOW 03/04/2021 0925   APPEARANCEUR HAZY (A) 03/04/2021 0925   LABSPEC 1.022 03/04/2021 0925   PHURINE 5.0 03/04/2021 0925   GLUCOSEU NEGATIVE 03/04/2021 0925   HGBUR MODERATE (A) 03/04/2021 0925   BILIRUBINUR NEGATIVE 03/04/2021 0925   KETONESUR 80 (A) 03/04/2021 0925   PROTEINUR >=300 (A) 03/04/2021 0925   NITRITE NEGATIVE 03/04/2021 0925   LEUKOCYTESUR NEGATIVE 03/04/2021 0925   Sepsis Labs Invalid input(s): PROCALCITONIN,  WBC,  LACTICIDVEN Microbiology Recent Results (from the past 240 hour(s))  Resp Panel by RT-PCR (Flu A&B, Covid) Nasopharyngeal Swab     Status: None   Collection Time: 03/04/21  9:40 AM   Specimen: Nasopharyngeal Swab; Nasopharyngeal(NP) swabs in vial transport medium  Result Value Ref Range Status   SARS Coronavirus 2 by RT PCR NEGATIVE NEGATIVE Final    Comment: (NOTE) SARS-CoV-2 target nucleic acids are NOT DETECTED.  The SARS-CoV-2  RNA is generally detectable in upper respiratory specimens during the acute phase of infection. The lowest concentration of SARS-CoV-2 viral copies this assay can detect is 138 copies/mL. A negative result does not preclude SARS-Cov-2 infection and should not be used as the sole basis for treatment or other patient management decisions. A negative result may occur with  improper specimen collection/handling, submission of specimen other than nasopharyngeal swab, presence of viral mutation(s) within the areas targeted by this assay, and inadequate number of viral copies(<138 copies/mL). A negative result must be combined with clinical observations, patient history, and epidemiological information. The expected result is Negative.  Fact Sheet for Patients:  EntrepreneurPulse.com.au  Fact Sheet for Healthcare Providers:   IncredibleEmployment.be  This test is no t yet approved or cleared by the [place] FDA and  has been authorized for detection and/or diagnosis of SARS-CoV-2 by FDA under an Emergency Use Authorization (EUA). This EUA will remain  in effect (meaning this test can be used) for the duration of the COVID-19 declaration under Section 564(b)(1) of the Act, 21 U.S.C.section 360bbb-3(b)(1), unless the authorization is terminated  or revoked sooner.       Influenza A by PCR NEGATIVE NEGATIVE Final   Influenza B by PCR NEGATIVE NEGATIVE Final    Comment: (NOTE) The Xpert Xpress SARS-CoV-2/FLU/RSV plus assay is intended as an aid in the diagnosis of influenza from Nasopharyngeal swab specimens and should not be used as a sole basis for treatment. Nasal washings and aspirates are unacceptable for Xpert Xpress SARS-CoV-2/FLU/RSV testing.  Fact Sheet for Patients: EntrepreneurPulse.com.au  Fact Sheet for Healthcare Providers: IncredibleEmployment.be  This test is not yet approved or cleared by the [place] FDA and has been authorized for detection and/or diagnosis of SARS-CoV-2 by FDA under an Emergency Use Authorization (EUA). This EUA will remain in effect (meaning this test can be used) for the duration of the COVID-19 declaration under Section 564(b)(1) of the Act, 21 U.S.C. section 360bbb-3(b)(1), unless the authorization is terminated or revoked.  Performed at [facility], [place] [address], [place], [place] [address]   Urine Culture     Status: Abnormal   Collection Time: 03/06/21 10:17 AM   Specimen: In/Out Cath Urine  Result Value Ref Range Status   Specimen Description IN/OUT CATH URINE  Final   Special Requests   Final    Immunocompromised Performed at [facility], [place] [address] [place], [place] [address]    Culture >=100,000 COLONIES/mL ESCHERICHIA COLI (A)  Final   Report Status 03/08/2021  FINAL  Final   Organism ID, Bacteria ESCHERICHIA COLI (A)  Final      Susceptibility   Escherichia coli - MIC*    AMPICILLIN 8 SENSITIVE Sensitive     CEFAZOLIN <=4 SENSITIVE Sensitive     CEFEPIME <=0.12 SENSITIVE Sensitive     CEFTRIAXONE <=0.25 SENSITIVE Sensitive     CIPROFLOXACIN <=0.25 SENSITIVE Sensitive     GENTAMICIN <=1 SENSITIVE Sensitive     IMIPENEM <=0.25 SENSITIVE Sensitive     NITROFURANTOIN <=16 SENSITIVE Sensitive     TRIMETH/SULFA <=20 SENSITIVE Sensitive     AMPICILLIN/SULBACTAM <=2 SENSITIVE Sensitive     PIP/TAZO <=4 SENSITIVE Sensitive     * >=100,000 COLONIES/mL ESCHERICHIA COLI    Please note: You were cared for by a hospitalist during your hospital stay. Once you are discharged, your primary care physician will handle any further medical issues. Please note that NO REFILLS for any discharge medications will be authorized  once you are discharged, as it is imperative that you return to your primary care physician (or establish a relationship with a primary care physician if you do not have one) for your post hospital discharge needs so that they can reassess your need for medications and monitor your lab values.    Time coordinating discharge: 40 minutes  SIGNED:   Shelly Coss, MD  [facility] 03/08/2021, 11:08 AM Pager [phone]  If 7PM-7AM, please contact night-coverage [email or web address] Password [email or web address]

## 2021-03-08 NOTE — Progress Notes (Signed)
NIF: -20 VC: 1.9L   With good patient effort.

## 2021-03-09 ENCOUNTER — Ambulatory Visit: Payer: Managed Care, Other (non HMO) | Admitting: Physical Therapy

## 2021-03-09 DIAGNOSIS — G35 Multiple sclerosis: Secondary | ICD-10-CM | POA: Diagnosis not present

## 2021-03-09 LAB — COMPREHENSIVE METABOLIC PANEL
ALT: 140 U/L — ABNORMAL HIGH (ref 0–44)
AST: 123 U/L — ABNORMAL HIGH (ref 15–41)
Albumin: 3.4 g/dL — ABNORMAL LOW (ref 3.5–5.0)
Alkaline Phosphatase: 90 U/L (ref 38–126)
Anion gap: 7 (ref 5–15)
BUN: 13 mg/dL (ref 6–20)
CO2: 25 mmol/L (ref 22–32)
Calcium: 8.9 mg/dL (ref 8.9–10.3)
Chloride: 101 mmol/L (ref 98–111)
Creatinine, Ser: 0.5 mg/dL (ref 0.44–1.00)
GFR, Estimated: >60 mL/min (ref >60)
Glucose, Bld: 109 mg/dL — ABNORMAL HIGH (ref 70–99)
Potassium: 4.2 mmol/L (ref 3.5–5.1)
Sodium: 133 mmol/L — ABNORMAL LOW (ref 135–145)
Total Bilirubin: 0.5 mg/dL (ref 0.3–1.2)
Total Protein: 6.5 g/dL (ref 6.5–8.1)

## 2021-03-09 LAB — CBC WITH DIFFERENTIAL/PLATELET
Abs Immature Granulocytes: 0.22 10*3/uL — ABNORMAL HIGH (ref 0.00–0.07)
Basophils Absolute: 0.1 10*3/uL (ref 0.0–0.1)
Basophils Relative: 1 %
Eosinophils Absolute: 0.4 10*3/uL (ref 0.0–0.5)
Eosinophils Relative: 4 %
HCT: 42.8 % (ref 36.0–46.0)
Hemoglobin: 14.5 g/dL (ref 12.0–15.0)
Immature Granulocytes: 2 %
Lymphocytes Relative: 16 %
Lymphs Abs: 1.9 10*3/uL (ref 0.7–4.0)
MCH: 31.9 pg (ref 26.0–34.0)
MCHC: 33.9 g/dL (ref 30.0–36.0)
MCV: 94.3 fL (ref 80.0–100.0)
Monocytes Absolute: 1.3 10*3/uL — ABNORMAL HIGH (ref 0.1–1.0)
Monocytes Relative: 11 %
Neutro Abs: 7.8 10*3/uL — ABNORMAL HIGH (ref 1.7–7.7)
Neutrophils Relative %: 66 %
Platelets: 302 10*3/uL (ref 150–400)
RBC: 4.54 MIL/uL (ref 3.87–5.11)
RDW: 12.9 % (ref 11.5–15.5)
WBC: 11.8 10*3/uL — ABNORMAL HIGH (ref 4.0–10.5)
nRBC: 0 % (ref 0.0–0.2)

## 2021-03-09 MED ORDER — OXYBUTYNIN CHLORIDE 5 MG PO TABS
5.0000 mg | ORAL_TABLET | Freq: Three times a day (TID) | ORAL | Status: DC
  Administered 2021-03-09 – 2021-03-13 (×13): 5 mg via ORAL
  Filled 2021-03-09 (×13): qty 1

## 2021-03-09 NOTE — Progress Notes (Signed)
Inpatient Rehabilitation Care Coordinator Assessment and Plan Patient Details  Name: Crystal Park MRN: [id number] Date of Birth: 1961/03/03  Today's Date: 03/09/2021  Hospital Problems: Principal Problem:   Multiple sclerosis [facility])  Past Medical History:  Past Medical History:  Diagnosis Date   Benign paroxysmal positional vertigo 10/12/2014   Constipation    Family history of breast cancer    Headache    history of migaines   History of kidney stones    History of MRSA infection    History of normocytic normochromic anemia    MS (multiple sclerosis) ([place])    diagnosed 9/16   Neurogenic bladder    self-caths   PONV (postoperative nausea and vomiting)    Past Surgical History:  Past Surgical History:  Procedure Laterality Date   BREAST LUMPECTOMY Right    benign   COLONOSCOPY     CYSTOSCOPY WITH INJECTION N/A 04/23/2017   Procedure: CYSTOSCOPY WITH INJECTION/ BOTOX 200 UNITS;  Surgeon: Festus Aloe, MD;  Location: [place];  Service: Urology;  Laterality: N/A;   OOPHORECTOMY Left    2013   TONSILLECTOMY AND ADENOIDECTOMY     age 36   TRANSURETHRAL RESECTION OF BLADDER TUMOR N/A 04/23/2017   Procedure: TRANSURETHRAL RESECTION OF BLADDER TUMOR (TURBT);  Surgeon: Festus Aloe, MD;  Location: [facility];  Service: Urology;  Laterality: N/A;   Social History:  reports that she has never smoked. She has never used smokeless tobacco. She reports that she does not currently use alcohol. She reports that she does not use drugs.  Family / Support Systems Marital Status: Married Patient Roles: Spouse, Parent, Other (Comment) (retiree) Spouse/Significant Other: Crystal Park [id number] Children: two grown daughter's Other Supports: Friends and church members along with pt's sister Anticipated Caregiver: Crystal Park Ability/Limitations of Caregiver: works from home and can provide assist Caregiver Availability: 24/7 Family  Dynamics: Close knit with family and children, her sister is a PT here on CIR. 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, agricultural Issues: No issues Guardian/Conservator: none-according to MD pt is capable of making her own decisions while here, but husband is here daily also   Abuse/Neglect Abuse/Neglect Assessment Can Be Completed: Yes Physical Abuse: Denies Verbal Abuse: Denies Sexual Abuse: Denies Exploitation of patient/patient's resources: Denies Self-Neglect: Denies  Patient response to: Social Isolation - How often do you feel lonely or isolated from those around you?: Never  Emotional Status Pt's affect, behavior and adjustment status: Pt is exhausted from morning therapies, she is glad to be here and acknowledges she needs the therapy due to being so deconditioned from being ill and now exacerbation. She hopes to regain her ability to ambulate and self cath herself as she was doing prior to admission Recent Psychosocial Issues: other health issues-mainly her MS Psychiatric History: No history may benefit from seeing neuro-psych while here due to loss of independence and illness. Substance Abuse History: No issues  Patient / Family Perceptions, Expectations & Goals Pt/Family understanding of illness & functional limitations: Pt and husband can explain her MS and exacerbation, both talk with the rounding  MD and feel they have a good understanding of her treatment plan moving forward. Both want to be kept updated regarding her medical  issues. Premorbid pt/family roles/activities: Wife, Mom, retiree, friend, sibling, church member, etc Anticipated changes in roles/activities/participation: resume Pt/family expectations/goals: Pt states: " I'm exhausted from this moring, I know I need to be here and glad to be.'  [employer]: None Premorbid Home Care/DME Agencies: Other (Comment) (Active with [facility]-[address] has quad cane, rollator, rw, tub seat and transport chair) Transportation available at discharge: Husband Is the patient able to respond to transportation needs?: Yes In the past 12 months, has lack of transportation kept you from medical appointments or from getting medications?: No In the past 12 months, has lack of transportation kept you from meetings, work, or from getting things needed for daily living?: No Resource referrals recommended: Neuropsychology  Discharge Planning Living Arrangements: Spouse/significant other Support Systems: Spouse/significant other, Children, Other relatives, Friends/neighbors Type of Residence: Private residence Insurance Resources: [employer], Multimedia programmer (specify) Psychologist, counselling) Financial Resources: Family Support, SSD Financial Screen Referred: No Living Expenses: Own Money Management: Patient, Spouse Does the patient have any problems obtaining your medications?: No Home Management: Pt and husband Patient/Family Preliminary Plans: Return home with husband who does work from home and can provide assist. He assists her with her care when needed. Pt has been here on rehab in 2018 and discussed the process again regarding team conference and updates. Care Coordinator Barriers to Discharge: Insurance for SNF coverage Care Coordinator Anticipated Follow Up Needs: HH/OP, Support Group  Clinical Impression Pleasant female who is motivated to do well here and recover from this MS exacerbation and health issues that brought her to the hospital. Her  husband is very involved and assisted with her care when needed. Will await therapy team evaluations and work on discharge needs. Pt may benefit from seeing neuro-psych while here for coping. Will get input from team.  Elease Hashimoto 03/09/2021, 12:09 PM

## 2021-03-09 NOTE — Evaluation (Signed)
Physical Therapy Assessment and Plan  Patient Details  Name: Crystal Park MRN: [id number] Date of Birth: 1960/12/03  PT Diagnosis: Abnormal posture, Abnormality of gait, Difficulty walking, and Muscle weakness Rehab Potential: Good ELOS: 10-14 days   Today's Date: 03/09/2021 PT Individual Time: 3291-9166 PT Individual Time Calculation (min): 60 min  and Today's Date: 03/09/2021 PT Missed Time: 15 Minutes Missed Time Reason: Patient fatigue   Hospital Problem: Principal Problem:   Multiple sclerosis ([place])   Past Medical History:  Past Medical History:  Diagnosis Date   Benign paroxysmal positional vertigo 10/12/2014   Constipation    Family history of breast cancer    Headache    history of migaines   History of kidney stones    History of MRSA infection    History of normocytic normochromic anemia    MS (multiple sclerosis) ([place])    diagnosed 9/16   Neurogenic bladder    self-caths   PONV (postoperative nausea and vomiting)    Past Surgical History:  Past Surgical History:  Procedure Laterality Date   BREAST LUMPECTOMY Right    benign   COLONOSCOPY     CYSTOSCOPY WITH INJECTION N/A 04/23/2017   Procedure: CYSTOSCOPY WITH INJECTION/ BOTOX 200 UNITS;  Surgeon: Festus Aloe, MD;  Location: [place];  Service: Urology;  Laterality: N/A;   OOPHORECTOMY Left    2013   TONSILLECTOMY AND ADENOIDECTOMY     age 54   TRANSURETHRAL RESECTION OF BLADDER TUMOR N/A 04/23/2017   Procedure: TRANSURETHRAL RESECTION OF BLADDER TUMOR (TURBT);  Surgeon: Festus Aloe, MD;  Location: [facility];  Service: Urology;  Laterality: N/A;    Assessment & Plan Clinical Impression: Patient is a 61 y.o. year old female with history of MS dx 2016 w/neurogenic bladder, dizziness and chronic R-sided weakness (Dr. Felecia Shelling), BPVV who was admitted on 03/04/21 with abdominal pain, diarrhea and N/V, poor po intake, weakness and inability to walk. She was noted to  be dehydrated with Scr-1.16, lactic acidosis and leucocytosis with WBC-16.4.  She was treated with IVF for viral gastroenteritis and Zofran for nausea. CT abdomen/pelvis was negative for acute abnormality. . Neurology consulted for worsening of right sided weakness and chronic fatigue. MRI brain, cervical and thoracic spine which showed no change in white matter disease and no active demyelination. Dr Quinn Axe felt that patient with pseudoexacerbation of MS symptoms and no further work up needed. UA/UCS showed E coli UTI and she was started on Keflex today.     At baseline, patient was able to ambulate about 100' with cane or HHA, limited by fatigue and dizziness (uses valium bid with some sedative SE), sleeps 10 pm to 11 am then takes a small nap around late afternoon. Caths every 4-5 hours. Goes out occasionally and uses transport chair. Therapy ongoing and patient noted to be deconditioned with weakness affecting functional status. CIR recommended due to functional decline. She has supportive family.   Patient currently requires min with mobility secondary to muscle weakness, decreased cardiorespiratoy endurance, and decreased sitting balance, decreased standing balance, and decreased postural control.  Prior to hospitalization, patient was modified independent  with mobility and lived with Spouse in a House home.  Home access is 1 STE from garage, unknown number of stairs in frontStairs to enter.  Patient will benefit from skilled PT intervention to maximize safe functional mobility, minimize fall risk, and decrease caregiver burden for planned discharge home with 24 hour assist.  Anticipate patient will benefit from follow  up [place] at discharge.  PT - End of Session Activity Tolerance: Tolerates < 10 min activity, no significant change in vital signs Endurance Deficit: Yes Endurance Deficit Description: MS exacerbation PT Assessment Rehab Potential (ACUTE/IP ONLY): Good PT Barriers to Discharge:  Neurogenic Bowel & Bladder PT Patient demonstrates impairments in the following area(s): Balance;Endurance;Motor;Pain;Safety PT Transfers Functional Problem(s): Bed Mobility;Bed to Chair;Car;Furniture PT Locomotion Functional Problem(s): Ambulation;Stairs;Wheelchair Mobility PT Plan PT Intensity: Minimum of 1-2 x/day ,45 to 90 minutes PT Frequency: Total of 15 hours over 7 days of combined therapies PT Duration Estimated Length of Stay: 10-14 days PT Treatment/Interventions: Ambulation/gait training;Community reintegration;DME/adaptive equipment instruction;Neuromuscular re-education;Psychosocial support;Stair training;UE/LE Strength taining/ROM;Wheelchair propulsion/positioning;UE/LE Coordination activities;Therapeutic Activities;Skin care/wound management;Pain management;Functional electrical stimulation;Discharge planning;Balance/vestibular training;Cognitive remediation/compensation;Disease management/prevention;Functional mobility training;Patient/family education;Splinting/orthotics;Therapeutic Exercise;Visual/perceptual remediation/compensation PT Transfers Anticipated Outcome(s): Mod I PT Locomotion Anticipated Outcome(s): S* PT Recommendation Recommendations for Other Services: Therapeutic Recreation consult;Neuropsych consult Therapeutic Recreation Interventions: Pet therapy;Kitchen group;Stress management Follow Up Recommendations: Home health PT Patient destination: Home Equipment Recommended: To be determined   PT Evaluation Precautions/Restrictions Precautions Precautions: Fall Restrictions Weight Bearing Restrictions: No Pain Interference Pain Interference Pain Effect on Sleep: 1. Rarely or not at all Pain Interference with Therapy Activities: 1. Rarely or not at all Pain Interference with Day-to-Day Activities: 1. Rarely or not at all Home Living/Prior [place]: Spouse/significant other Available Help at Discharge: Family;Available  24 hours/day Type of Home: House Home Access: Stairs to enter [employer] of Steps: 1 STE from garage, unknown number of stairs in front Entrance Stairs-Rails: None Home Layout: One level Bathroom Shower/Tub: Multimedia programmer: Handicapped height Bathroom Accessibility: Yes Additional Comments: Was using rollator primarily prior to admission, alos has QC  Lives With: Spouse Prior Function Level of Independence: Requires assistive device for independence  Able to Take Stairs?: No Driving: No Vocation Requirements: Teacher Vision/Perception  Vision - History Ability to See in Adequate Light: 0 Adequate Perception Perception: Within Functional Limits Praxis Praxis: Intact  Cognition Overall Cognitive Status: Within Functional Limits for tasks assessed Arousal/Alertness: Lethargic Orientation Level: Oriented X4 Year: 2023 Month: January Day of Week: Correct Memory: Appears intact Awareness: Appears intact Problem Solving: Appears intact Safety/Judgment: Appears intact Sensation Sensation Light Touch: Impaired Detail Peripheral sensation comments: Pt reports chronic numbness/tingling in B feet Light Touch Impaired Details: Impaired LLE;Impaired RLE Proprioception: Appears Intact Coordination Gross Motor Movements are Fluid and Coordinated: No Coordination and Movement Description: Affected by fatigue, global deconditioning Finger Nose Finger Test: Slowed bilaterally but WNL Heel Shin Test: Unable to perform due to bilateral weakness (RLE weaker than LLE) Motor  Motor Motor: Other (comment) Motor - Skilled Clinical Observations: Global deconditioning, mild trunk lean to R side   Trunk/Postural Assessment  Cervical Assessment Cervical Assessment: Within Functional Limits Thoracic Assessment Thoracic Assessment: Exceptions to [facility] (Kyphotic) Lumbar Assessment Lumbar Assessment: Exceptions to [facility] (Posterior pelvic tilt) Postural Control Postural  Control: Deficits on evaluation Trunk Control: decreased 2/2 fatigue  Balance Balance Balance Assessed: Yes Static Sitting Balance Static Sitting - Balance Support: Feet supported;Bilateral upper extremity supported Static Sitting - Level of Assistance: 5: Stand by assistance Dynamic Sitting Balance Dynamic Sitting - Balance Support: Feet supported;Left upper extremity supported Dynamic Sitting - Level of Assistance: 5: Stand by assistance Dynamic Sitting - Balance Activities: Forward lean/weight shifting;Reaching across midline;Lateral lean/weight shifting Static Standing Balance Static Standing - Balance Support: During functional activity;Bilateral upper extremity supported Static Standing - Level of Assistance: 4: Min assist Dynamic Standing Balance Dynamic Standing - Balance Support:  During functional activity;Bilateral upper extremity supported Dynamic Standing - Level of Assistance: 4: Min assist Extremity Assessment  RLE Assessment RLE Assessment: Exceptions to [facility] RLE Strength RLE Overall Strength: Deficits;Due to premorbid status Right Hip Flexion: 2/5 Right Hip Extension: 2/5 Right Hip ABduction: 1/5 Right Hip ADduction: 1/5 Right Knee Flexion: 3-/5 Right Knee Extension: 3-/5 Right Ankle Dorsiflexion: 3-/5 Right Ankle Plantar Flexion: 3-/5 LLE Assessment LLE Assessment: Exceptions to WFL LLE Strength LLE Overall Strength: Deficits;Due to premorbid status Left Hip Flexion: 3+/5 Left Hip Extension: 3+/5 Left Hip ABduction: 2-/5 Left Hip ADduction: 2-/5 Left Knee Flexion: 3+/5 Left Knee Extension: 3+/5 Left Ankle Dorsiflexion: 3+/5 Left Ankle Plantar Flexion: 3+/5  Care Tool Care Tool Bed Mobility Roll left and right activity   Roll left and right assist level: Minimal Assistance - Patient > 75%    Sit to lying activity   Sit to lying assist level: Minimal Assistance - Patient > 75%    Lying to sitting on side of bed activity   Lying to sitting on side of  bed assist level: the ability to move from lying on the back to sitting on the side of the bed with no back support.: Contact Guard/Touching assist     Care Tool Transfers Sit to stand transfer   Sit to stand assist level: Minimal Assistance - Patient > 75%    Chair/bed transfer   Chair/bed transfer assist level: Minimal Assistance - Patient > 75%     Psychologist, counselling transfer activity did not occur: Safety/medical concerns (Fatigue, MS exacerbation)        Care Tool Locomotion Ambulation   Assist level: Minimal Assistance - Patient > 75% Assistive device: Walker-rolling Max distance: 10'  Walk 10 feet activity   Assist level: Minimal Assistance - Patient > 75% Assistive device: Walker-rolling   Walk 50 feet with 2 turns activity Walk 50 feet with 2 turns activity did not occur: Safety/medical concerns (Fatigue, MS exacerbation)      Walk 150 feet activity Walk 150 feet activity did not occur: Safety/medical concerns (Fatigue, MS exacerbation)      Walk 10 feet on uneven surfaces activity Walk 10 feet on uneven surfaces activity did not occur: Safety/medical concerns (Fatigue, MS exacerbation)      Stairs Stair activity did not occur: Safety/medical concerns (Fatigue, MS exacerbation)        Walk up/down 1 step activity Walk up/down 1 step or curb (drop down) activity did not occur: Safety/medical concerns (Fatigue, MS exacerbation)      Walk up/down 4 steps activity Walk up/down 4 steps activity did not occur: Safety/medical concerns (Fatigue, MS exacerbation)      Walk up/down 12 steps activity Walk up/down 12 steps activity did not occur: Safety/medical concerns (Fatigue, MS exacerbation)      Pick up small objects from floor   Pick up small object from the floor assist level: Dependent - Patient 0%    Wheelchair Is the patient using a wheelchair?: Yes Type of Wheelchair: Manual   Wheelchair assist level: Dependent - Patient 0%     Wheel 50 feet with 2 turns activity   Assist Level: Dependent - Patient 0%  Wheel 150 feet activity   Assist Level: Dependent - Patient 0%    Refer to Care Plan for Long Term Goals  SHORT TERM GOAL WEEK 1 PT Short Term Goal 1 (Week 1): Pt will perform sit <>stand transfers w/CGA and LRAD consistently  PT Short Term Goal 2 (Week 1): Pt will ambulate 25' w/LRAD and CGA PT Short Term Goal 3 (Week 1): Pt will intiate stair training w/min A to prepare for DC home  Recommendations for other services: Neuropsych and Therapeutic Recreation  Pet therapy, Kitchen group, and Stress management  Skilled Therapeutic Intervention Evaluation completed (see details above and below) with education on PT POC, CIR 3hr therapy requirement, CIR safety policy and goals. Pt received supine in bed asleep, easy to arouse. Pt reported 7/10 pain in bladder and was premedicated, offered repositioning and distraction throughout session for pain modulation. Pt able to roll to L and R side w/min A and use of bedrails w/HOB elevated to 30 degrees. Supine <> sit EOB w/min A for trunk support and pt reported significant lightheadedness. Vitals obtained while seated EOB as follows: BP 132/97 mmHg, HR 109 bpm, SpO2 93%. Pt requested to lay back down, sit <>supine w/CGA. After several minutes of rest, pt performed supine <>sit EOB and sit <>stand to RW w/min A for trunk support. Pt ambulated 10' w/RW and min A for steadying assist, noted shuffling gait pattern, narrow BOS, decreased stride length and forward lean on RW. Pt requested to return to bed and stated she was too fatigued to continue evaluation. Sit <> supine w/CGA and pt was left supine in bed, all needs in reach, safety plan updated.   Mobility Bed Mobility Bed Mobility: Rolling Right;Rolling Left;Supine to Sit;Sit to Supine Rolling Right: Minimal Assistance - Patient > 75% Rolling Left: Minimal Assistance - Patient > 75% Supine to Sit: Minimal Assistance - Patient >  75% Sit to Supine: Contact Guard/Touching assist Transfers Transfers: Sit to Stand;Stand to Sit Sit to Stand: Minimal Assistance - Patient > 75% Stand to Sit: Minimal Assistance - Patient > 75% Transfer (Assistive device): Rolling walker Locomotion  Gait Ambulation: Yes Gait Assistance: Minimal Assistance - Patient > 75% Gait Distance (Feet): 10 Feet Assistive device: Rolling walker Gait Assistance Details: Verbal cues for precautions/safety;Manual facilitation for weight bearing Gait Gait: Yes Gait Pattern: Impaired Gait Pattern: Step-to pattern;Decreased step length - left;Decreased step length - right;Decreased stride length;Decreased dorsiflexion - right;Decreased dorsiflexion - left;Poor foot clearance - left;Poor foot clearance - right;Shuffle;Narrow base of support Gait velocity: decreased Stairs / Additional Locomotion Stairs: No Wheelchair Mobility Wheelchair Mobility: No   Discharge Criteria: Patient will be discharged from PT if patient refuses treatment 3 consecutive times without medical reason, if treatment goals not met, if there is a change in medical status, if patient makes no progress towards goals or if patient is discharged from hospital.  The above assessment, treatment plan, treatment alternatives and goals were discussed and mutually agreed upon: by patient  Cruzita Lederer Plaster, PT, DPT 03/09/2021, 12:28 PM

## 2021-03-09 NOTE — Evaluation (Signed)
Occupational Therapy Assessment and Plan  Patient Details  Name: Crystal Park MRN: [id number] Date of Birth: March 02, 1961  OT Diagnosis: cognitive deficits and muscle weakness (generalized) Rehab Potential: Rehab Potential (ACUTE ONLY): Good ELOS: 10-14 days   Today's Date: 03/09/2021 OT Individual Time: 0900-1000; and 1210-1230 OT Individual Time Calculation (min): 60 min     Hospital Problem: Principal Problem:   Multiple sclerosis ([place])   Past Medical History:  Past Medical History:  Diagnosis Date   Benign paroxysmal positional vertigo 10/12/2014   Constipation    Family history of breast cancer    Headache    history of migaines   History of kidney stones    History of MRSA infection    History of normocytic normochromic anemia    MS (multiple sclerosis) ([place])    diagnosed 9/16   Neurogenic bladder    self-caths   PONV (postoperative nausea and vomiting)    Past Surgical History:  Past Surgical History:  Procedure Laterality Date   BREAST LUMPECTOMY Right    benign   COLONOSCOPY     CYSTOSCOPY WITH INJECTION N/A 04/23/2017   Procedure: CYSTOSCOPY WITH INJECTION/ BOTOX 200 UNITS;  Surgeon: Festus Aloe, MD;  Location: [facility];  Service: Urology;  Laterality: N/A;   OOPHORECTOMY Left    2013   TONSILLECTOMY AND ADENOIDECTOMY     age 48   TRANSURETHRAL RESECTION OF BLADDER TUMOR N/A 04/23/2017   Procedure: TRANSURETHRAL RESECTION OF BLADDER TUMOR (TURBT);  Surgeon: Festus Aloe, MD;  Location: [facility];  Service: Urology;  Laterality: N/A;    Assessment & Plan Clinical Impression: Crystal Park is a 61 year old female with history of MS dx 2016 w/neurogenic bladder, dizziness and chronic R-sided weakness (Dr. Felecia Shelling), BPVV who was admitted on 03/04/21 with abdominal pain, diarrhea and N/V, poor po intake, weakness and inability to walk. She was noted to be dehydrated with Scr-1.16, lactic acidosis and leucocytosis  with WBC-16.4.  She was treated with IVF for viral gastroenteritis and Zofran for nausea. CT abdomen/pelvis was negative for acute abnormality. . Neurology consulted for worsening of right sided weakness and chronic fatigue. MRI brain, cervical and thoracic spine which showed no change in white matter disease and no active demyelination. Dr Quinn Axe felt that patient with pseudoexacerbation of MS symptoms and no further work up needed. UA/UCS showed E coli UTI and she was started on Keflex today.     At baseline, patient was able to ambulate about 100' with cane or HHA, limited by fatigue and dizziness (uses valium bid with some sedative SE), sleeps 10 pm to 11 am then takes a small nap around late afternoon. Caths every 4-5 hours. Goes out occasionally and uses transport chair. Therapy ongoing and patient noted to be deconditioned with weakness affecting functional status.Patient transferred to CIR on 03/08/2021 .    Patient currently requires mod with basic self-care skills secondary to muscle weakness, decreased cardiorespiratoy endurance, decreased coordination and decreased motor planning, decreased visual motor skills, decreased initiation, decreased attention, decreased awareness, decreased problem solving, decreased memory, and delayed processing, and decreased sitting balance, decreased standing balance, decreased postural control, and decreased balance strategies.  Prior to hospitalization, patient could complete ADLs with modified independent .  Patient will benefit from skilled intervention to decrease level of assist with basic self-care skills prior to discharge home with care partner.  Anticipate patient will require intermittent supervision and follow up outpatient.  OT - End of Session Activity Tolerance:  Tolerates 10 - 20 min activity with multiple rests Endurance Deficit: Yes Endurance Deficit Description: MS exacerbation OT Assessment Rehab Potential (ACUTE ONLY): Good OT Patient  demonstrates impairments in the following area(s): Balance;Cognition;Endurance;Vision;Motor;Pain OT Basic ADL's Functional Problem(s): Eating;Grooming;Bathing;Dressing;Toileting OT Transfers Functional Problem(s): Toilet;Tub/Shower OT Additional Impairment(s): Fuctional Use of Upper Extremity OT Plan OT Intensity: Minimum of 1-2 x/day, 45 to 90 minutes OT Frequency: 5 out of 7 days OT Duration/Estimated Length of Stay: 10-14 days OT Treatment/Interventions: Balance/vestibular training;Discharge planning;Pain management;Self Care/advanced ADL retraining;Therapeutic Activities;UE/LE Coordination activities;Cognitive remediation/compensation;Disease mangement/prevention;Functional mobility training;Patient/family education;Therapeutic Exercise;Visual/perceptual remediation/compensation;DME/adaptive equipment instruction;Neuromuscular re-education;Psychosocial support;UE/LE Strength taining/ROM;Splinting/orthotics;Wheelchair propulsion/positioning OT Self Feeding Anticipated Outcome(s): setup OT Basic Self-Care Anticipated Outcome(s): mod I- supervision OT Toileting Anticipated Outcome(s): mod I OT Bathroom Transfers Anticipated Outcome(s): mod I OT Recommendation Patient destination: Home Follow Up Recommendations: Outpatient OT Equipment Recommended: To be determined   OT Evaluation Precautions/Restrictions  Precautions Precautions: Fall Restrictions Weight Bearing Restrictions: No General Chart Reviewed: Yes PT Missed Treatment Reason: Patient fatigue Pain Pain Assessment Pain Scale: 0-10 Pain Score: 0-No pain Pain Type: Acute pain Pain Location: Bladder Pain Descriptors / Indicators: Aching Pain Frequency: Intermittent Pain Onset: Progressive Patients Stated Pain Goal: 0 Pain Intervention(s): Medication (See eMAR) Home Living/Prior Functioning Home Living Family/patient expects to be discharged to:: Private residence Living Arrangements: Spouse/significant other Available  Help at Discharge: Family, Available 24 hours/day Type of Home: House Home Access: Stairs to enter [employer] of Steps: 1 STE from garage, unknown number of stairs in front Entrance Stairs-Rails: None Home Layout: One level Bathroom Shower/Tub: Multimedia programmer: Handicapped height Bathroom Accessibility: Yes Additional Comments: Was using rollator primarily prior to admission, alos has QC  Lives With: Spouse Prior Function Level of Independence: Requires assistive device for independence, Independent with transfers, Independent with basic ADLs, Needs assistance with homemaking, Needs assistance with gait  Able to Take Stairs?: No Driving: No Vocation Requirements: Retired Contractor Baseline Vision/History: 1 Wears glasses (for distance only) Ability to See in Adequate Light: 0 Adequate Patient Visual Report: No change from baseline Vision Assessment?: Yes Eye Alignment: Within Functional Limits Ocular Range of Motion: Within Functional Limits Alignment/Gaze Preference: Within Defined Limits Tracking/Visual Pursuits: Decreased smoothness of eye movement to RIGHT superior field Saccades: Additional eye shifts occurred during testing Convergence: Impaired - to be further tested in functional context Perception  Perception: Within Functional Limits Praxis Praxis: Intact Cognition Overall Cognitive Status: Within Functional Limits for tasks assessed Arousal/Alertness: Lethargic Orientation Level: Person;Place;Situation Person: Oriented Place: Oriented Situation: Oriented Year: 2023 Month: January Day of Week: Incorrect (initially stated wednesday needing cue to problem solve correct date) Memory: Appears intact Immediate Memory Recall: Sock;Blue;Bed Memory Recall Sock: Without Cue Memory Recall Blue: Without Cue Memory Recall Bed: Without Cue Attention: Focused;Sustained Focused Attention: Appears intact Sustained Attention:  Impaired Sustained Attention Impairment: Verbal basic;Functional basic (likely secondary to lethargy) Awareness: Appears intact Problem Solving: Impaired Problem Solving Impairment: Functional basic Safety/Judgment: Appears intact Sensation Sensation Light Touch: Impaired Detail Peripheral sensation comments: Pt reports chronic numbness/tingling in B feet Light Touch Impaired Details: Impaired LLE;Impaired RLE Hot/Cold: Appears Intact Proprioception: Appears Intact Stereognosis: Not tested Coordination Gross Motor Movements are Fluid and Coordinated: No Fine Motor Movements are Fluid and Coordinated: No Coordination and Movement Description: Affected by fatigue, global deconditioning Finger Nose Finger Test: Slowed bilaterally but WNL Heel Shin Test: Unable to perform due to bilateral weakness (RLE weaker than LLE) Motor  Motor Motor: Other (comment) Motor - Skilled Clinical Observations: Global deconditioning  Trunk/Postural  Assessment  Cervical Assessment Cervical Assessment: Within Functional Limits Thoracic Assessment Thoracic Assessment: Exceptions to [facility] Fall (kyphotic) Lumbar Assessment Lumbar Assessment: Exceptions to [facility] (posterior pelvic tilt) Postural Control Postural Control: Deficits on evaluation Trunk Control: decreased 2/2 fatigue; delayed righting reactions  Balance Balance Balance Assessed: Yes Static Sitting Balance Static Sitting - Balance Support: Feet supported;Bilateral upper extremity supported Static Sitting - Level of Assistance: 5: Stand by assistance Dynamic Sitting Balance Dynamic Sitting - Balance Support: Feet supported;Left upper extremity supported Dynamic Sitting - Level of Assistance: 5: Stand by assistance Dynamic Sitting - Balance Activities: Forward lean/weight shifting;Reaching across midline;Lateral lean/weight shifting Sitting balance - Comments: supervision Static Standing Balance Static Standing - Balance Support: During functional  activity;Left upper extremity supported Static Standing - Level of Assistance: 4: Min assist Dynamic Standing Balance Dynamic Standing - Balance Support: During functional activity;Left upper extremity supported Dynamic Standing - Level of Assistance: 4: Min assist;3: Mod assist Extremity/Trunk Assessment RUE Assessment RUE Assessment: Exceptions to [facility] Active Range of Motion (AROM) Comments: Shoulder FF 90 degrees; Abduction WFL; elbow, wrist, and hand WNL General Strength Comments: Shoulder 3-/5; elbow flex 4-/5 ext 3+/5; forearm, wrist and hand 3+/5 LUE Assessment LUE Assessment: Exceptions to [facility] General Strength Comments: 4-/5  Care Tool Care Tool Self Care Eating   Eating Assist Level: Set up assist    Oral Care    Oral Care Assist Level: Minimal Assistance - Patient > 75%    Bathing         Assist Level: Moderate Assistance - Patient 50 - 74%    Upper Body Dressing(including orthotics)       Assist Level: Supervision/Verbal cueing    Lower Body Dressing (excluding footwear)     Assist for lower body dressing: Maximal Assistance - Patient 25 - 49%    Putting on/Taking off footwear   What is the patient wearing?: Socks Assist for footwear: Maximal Assistance - Patient 25 - 49%       Care Tool Toileting Toileting activity   Assist for toileting: Dependent - Patient 0%     Care Tool Bed Mobility Roll left and right activity   Roll left and right assist level: Minimal Assistance - Patient > 75%    Sit to lying activity   Sit to lying assist level: Minimal Assistance - Patient > 75%    Lying to sitting on side of bed activity   Lying to sitting on side of bed assist level: the ability to move from lying on the back to sitting on the side of the bed with no back support.: Contact Guard/Touching assist     Care Tool Transfers Sit to stand transfer   Sit to stand assist level: Minimal Assistance - Patient > 75%    Chair/bed transfer   Chair/bed transfer  assist level: Minimal Assistance - Patient > 75%     Toilet transfer   Assist Level: Moderate Assistance - Patient 50 - 74%     Care Tool Cognition  Expression of Ideas and Wants Expression of Ideas and Wants: 3. Some difficulty - exhibits some difficulty with expressing needs and ideas (e.g, some words or finishing thoughts) or speech is not clear  Understanding Verbal and Non-Verbal Content Understanding Verbal and Non-Verbal Content: 4. Understands (complex and basic) - clear comprehension without cues or repetitions   Memory/Recall Ability Memory/Recall Ability : Current season;Location of own room;That he or she is in a hospital/hospital unit   Refer to Care Plan for Long Term Goals  SHORT TERM GOAL WEEK 1 OT Short Term Goal 1 (Week 1): Pt will complete toileting with min assist for both bowel and bladder self cath at [facility]. OT Short Term Goal 2 (Week 1): Pt will complete LB dressing with min assist OT Short Term Goal 3 (Week 1): Pt will complete UB/LB bathing using AE as needed with CGA. OT Short Term Goal 4 (Week 1): Pt will complete BSC transfer with CGA.  Recommendations for other services: None    Skilled Therapeutic Intervention ADL ADL Eating: Set up (sandwich; anticipate using utensils may require min assist) Where Assessed-Eating: Bed level Where Assessed-Grooming:  (pt politely refused) Upper Body Bathing:  (pt politely refused) Lower Body Bathing:  (pt politely refused) Upper Body Dressing: Supervision/safety;Setup Where Assessed-Upper Body Dressing: Edge of bed Lower Body Dressing: Maximal assistance;Minimal cueing Where Assessed-Lower Body Dressing: Edge of bed Toileting: Dependent (in/out cath due to fatigue; completed with assist of nursing at bed level; pt reports she usually does cath sitting on toilet) Mobility  Bed Mobility Bed Mobility: Rolling Right;Rolling Left;Supine to Sit;Sit to Supine Rolling Right: Minimal Assistance - Patient > 75% Rolling Left:  Minimal Assistance - Patient > 75% Supine to Sit: Moderate Assistance - Patient 50-74% (on left side) Sit to Supine: Contact Guard/Touching assist Transfers Sit to Stand: Minimal Assistance - Patient > 75% Stand to Sit: Moderate Assistance - Patient 50-74%  Skilled Intervention:  Pt semi reclined in bed, asleep, easily aroused awake.  Pt appearing with flat affect but pleasant with no c/o pain.  Initial evaluation completed and collaborated with pt regarding OT POC.  Pt reports feeling the need to catheterize stating her bladder feels full.  Nursing made aware who recommended placing pt on bedpan first to attempt urinating without cath.  Min assist with use of bed rail to roll to right for dependent clothing mgt and bedpan placement.  W/c procured while pt attempted continence.  Pt assisted off bed pan with min assist to roll and dependent removal of pan. Dependent pericare as well.  Pt reporting she would like to hold off on further OT session until cath complete due to discomfort.   OT returned at lunch and pt once again sleeping but easy to wake.  Pt needing some encouragement to complete UB/LB dressing at EOB.  Min assist to roll to left and mod assist sidelying to sit.  Pt dressed UB/LB per above levels of assist.  Pt requesting back to bed per above.  Pt left with lunch tray setup sitting upright in bed.  Call bell in reach, bed alarm on.   Discharge Criteria: Patient will be discharged from OT if patient refuses treatment 3 consecutive times without medical reason, if treatment goals not met, if there is a change in medical status, if patient makes no progress towards goals or if patient is discharged from hospital.  The above assessment, treatment plan, treatment alternatives and goals were discussed and mutually agreed upon: by patient  Ezekiel Slocumb 03/09/2021, 1:37 PM

## 2021-03-09 NOTE — Progress Notes (Signed)
Patient ID: Crystal Park, female   DOB: [birth year], 61 y.o.   MRN: [id number]  Met with patient in room, introduced myself and explained my role in her care. Patient was resting comfortably in bed. However, seems very down and depressed. I explained that I added educational handouts to her [place] and that it was hers to take with her at discharge. I included Constipation Adult, Neurogenic Bladder, Understanding Your Risk for Falls. I explained that I would continue to monitor her progress while on rehab.  Dorthula Nettles, RN3, BSN, CBIS, [place], [facility], Inpatient Rehabilitation Office [phone] Cell [phone]

## 2021-03-09 NOTE — Plan of Care (Signed)
°  Problem: RH Balance Goal: LTG Patient will maintain dynamic standing balance (PT) Description: LTG:  Patient will maintain dynamic standing balance with assistance during mobility activities (PT) Flowsheets (Taken 03/09/2021 1233) LTG: Pt will maintain dynamic standing balance during mobility activities with:: (LRAD) Independent with assistive device    Problem: Sit to Stand Goal: LTG:  Patient will perform sit to stand with assistance level (PT) Description: LTG:  Patient will perform sit to stand with assistance level (PT) Flowsheets (Taken 03/09/2021 1233) LTG: PT will perform sit to stand in preparation for functional mobility with assistance level: (LRAD) Independent with assistive device   Problem: RH Bed Mobility Goal: LTG Patient will perform bed mobility with assist (PT) Description: LTG: Patient will perform bed mobility with assistance, with/without cues (PT). Flowsheets (Taken 03/09/2021 1233) LTG: Pt will perform bed mobility with assistance level of: (bedrail) Independent with assistive device    Problem: RH Bed to Chair Transfers Goal: LTG Patient will perform bed/chair transfers w/assist (PT) Description: LTG: Patient will perform bed to chair transfers with assistance (PT). Flowsheets (Taken 03/09/2021 1233) LTG: Pt will perform Bed to Chair Transfers with assistance level: (LRAD) Independent with assistive device    Problem: RH Car Transfers Goal: LTG Patient will perform car transfers with assist (PT) Description: LTG: Patient will perform car transfers with assistance (PT). Flowsheets (Taken 03/09/2021 1233) LTG: Pt will perform car transfers with assist:: (LRAD) Supervision/Verbal cueing   Problem: RH Ambulation Goal: LTG Patient will ambulate in home environment (PT) Description: LTG: Patient will ambulate in home environment, # of feet with assistance (PT). Flowsheets (Taken 03/09/2021 1233) LTG: Pt will ambulate in home environ  assist needed:: (LRAD)  Supervision/Verbal cueing LTG: Ambulation distance in home environment: 50'   Problem: RH Stairs Goal: LTG Patient will ambulate up and down stairs w/assist (PT) Description: LTG: Patient will ambulate up and down # of stairs with assistance (PT) Flowsheets (Taken 03/09/2021 1233) LTG: Pt will ambulate up/down stairs assist needed:: Supervision/Verbal cueing LTG: Pt will  ambulate up and down number of stairs: 1 (house entry)

## 2021-03-09 NOTE — Progress Notes (Signed)
Inpatient Rehabilitation Center Individual Statement of Services  Patient Name:  Crystal Park  Date:  03/09/2021  Welcome to the Inpatient Rehabilitation Center.  Our goal is to provide you with an individualized program based on your diagnosis and situation, designed to meet your specific needs.  With this comprehensive rehabilitation program, you will be expected to participate in at least 3 hours of rehabilitation therapies Monday-Friday, with modified therapy programming on the weekends.  Your rehabilitation program will include the following services:  Physical Therapy (PT), Occupational Therapy (OT), 24 hour per day rehabilitation nursing, Therapeutic Recreaction (TR), Neuropsychology, Care Coordinator, Rehabilitation Medicine, Nutrition Services, and Pharmacy Services  Weekly team conferences will be held on Tuesday to discuss your progress.  Your Inpatient Rehabilitation Care Coordinator will talk with you frequently to get your input and to update you on team discussions.  Team conferences with you and your family in attendance may also be held.  Expected length of stay: 12-14 days  Overall anticipated outcome: independent-supervision level  Depending on your progress and recovery, your program may change. Your Inpatient Rehabilitation Care Coordinator will coordinate services and will keep you informed of any changes. Your Inpatient Rehabilitation Care Coordinator's name and contact numbers are listed  below.  The following services may also be recommended but are not provided by the Inpatient Rehabilitation Center:   Home Health Rehabiltiation Services Outpatient Rehabilitation Services    Arrangements will be made to provide these services after discharge if needed.  Arrangements include referral to agencies that provide these services.  Your insurance has been verified to be:  [place] Medicare part A Your primary doctor is:  [name]  Pertinent information will be  shared with your doctor and your insurance company.  Inpatient Rehabilitation Care Coordinator:  Dossie Der, [place] [phone] or Luna Glasgow  Information discussed with and copy given to patient by: [name], 03/09/2021, 12:15 PM

## 2021-03-09 NOTE — Progress Notes (Signed)
Occupational Therapy Session Note  Patient Details  Name: Crystal Park MRN: [id number] Date of Birth: Feb 12, 1961  Today's Date: 03/09/2021 OT Individual Time: 1420-1535 OT Individual Time Calculation (min): 75 min    Short Term Goals: Week 1:  OT Short Term Goal 1 (Week 1): Pt will complete toileting with min assist for both bowel and bladder self cath at [facility]. OT Short Term Goal 2 (Week 1): Pt will complete LB dressing with min assist OT Short Term Goal 3 (Week 1): Pt will complete UB/LB bathing using AE as needed with CGA. OT Short Term Goal 4 (Week 1): Pt will complete BSC transfer with CGA.  Skilled Therapeutic Interventions/Progress Updates:   Pt in bed to start session reporting being fatigued today.  No dizziness reported in supine but she does state slight abdominal pain and feeling like she has to urinate.  At home she had to self cath, so called NT for bladder scan with pt only showing 123 mls.  She reported having a urinary tract infection which may explain the reason she felt like she needed to urinate.  Had her transfer to sitting EOB with min assist needed to bring LEs off and bring trunk upright.  She states dizziness at a 3/10 initially and then resolving.  No noted nystagmus seen in her eyes.  Had her complete sit to stand with use of the RW and she was able to report dizziness the same at 3/10 subsiding after less than 30 seconds.  Again no nystagmus seen. Had her return to sitting with quick transition to left sidelying where her dizziness increased slightly with the movement to a 4/10.  Had her transition to supine with min assist for testing of inner ear canals.  No signs of BPPV noted with testing of horizontal, anterior, or posterior canals.  She transferred back to the EOB with dizziness again reported at 3/10 but not severe.  Completed further visual/vestibular testing in sitting.  Occulomotor ROM was limited laterally and lateral superiorly in the right eye.   Bilaterally, superior motion seemed to be limited as well.  End range horizontal nystagmus was present with fixed gaze to the left, but not prominent with gaze to the right.  Jerky tracking was noted more in the right eye as well compared to the left.  Right eye also demonstrated decreased convergence compared to the left but she was able to read [facility] without any corrective lenses.  VOR was within normal limits for head movements left to right and up and down, however she reported increased dizziness at an 8/10 with these head movements in all directions.  VOR cancellation was impaired with pt demonstrating catchup saccades with tracking moving finger with head turns horizontally.  Again, pt reported increased dizziness at 8/10 which subsided quickly down to a 3-4/10 within 10 seconds of resting.  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  Per her spouse these deficits have been going on a while and are not new to this hospitalization.  Feel she will benefit from completion of habituation movements to help decrease dizziness over time including sit to stand, sit to sidelying, rotational head movements including X1 gaze stabilization exercises with possible progression to X2, in order to help decrease this dizziness.  Pt left in bed at end of session with family present for support and call button and phone in reach.    Therapy Documentation  Precautions:  Precautions Precautions: Fall Restrictions Weight Bearing Restrictions: No  Pain: Pain Assessment Pain Scale: Faces Pain Score: 0-No pain Faces Pain Scale: Hurts a little bit Pain Type: Acute pain Pain Location: Abdomen Pain Orientation: Lower Pain Descriptors / Indicators: Discomfort Pain Onset: With Activity Pain Intervention(s): Repositioned;Emotional support    Therapy/Group: Individual Therapy  Brianah Hopson  OTR/L 03/09/2021, 4:24 PM

## 2021-03-09 NOTE — Progress Notes (Signed)
Occupational Therapy Session Note  Patient Details  Name: Crystal Park MRN: [id number] Date of Birth: 01-28-1961  Today's Date: 03/10/2021 OT Individual Time: 3154-0086 and 7619-5093 OT Individual Time Calculation (min): 43 min and 27 min   Short Term Goals: Week 1:  OT Short Term Goal 1 (Week 1): Pt will complete toileting with min assist for both bowel and bladder self cath at [facility]. OT Short Term Goal 2 (Week 1): Pt will complete LB dressing with min assist OT Short Term Goal 3 (Week 1): Pt will complete UB/LB bathing using AE as needed with CGA. OT Short Term Goal 4 (Week 1): Pt will complete BSC transfer with CGA.  Skilled Therapeutic Interventions/Progress Updates:    Pt greeted in the w/c, reporting urge to urinate. Per nursing, pt very recently completed toileting x2. Pt also with UTI. Therefore started session with washing her hair using the hair washing tray. Worked on UB strengthening/endurance while brushing and blow-drying hair afterwards with vcs and encouragement as pt fatigues very quickly. She doffed shirt with increased time and min cuing and donned a new shirt with the same assistance. Assisted pt in using the call bell to ask her nurse for some tylenol. She remained sitting up, left with all needs within reach and safety belt fastened.   2nd Session 1:1 tx (27 min) Pt greeted while sitting on the BSC. Per family, pt actively trying to have a BM but presently unsuccessful. OT warmed pt up some prune juice and while pt drank. Reviewed potty squat positioning, rocking technique, and abdominal massage to facilitate gastric motility. Educated family on helping pt order food high in fiber to help increase ease of BMs at the hospital. Per spouse, pt requires use of a suppository 3-4x yearly due to constipation. CGA for sit<stands using RW for pericare. Note that pt had to sit x2 times after due to urge to urinate. She had x1 instance of bladder void. Very small BM with red and  orange contents. RN in to assess. After OT assisted with clothing mgt, pt returned to bed via stand pivot with Min A using RW for standing support, Min A for side-stepping towards HOB. Min A to return to bed. Pt was left in care of RN to take some pain medicine. Tx focus placed on ADL retraining, sit<stands, standing balance, and pt/family education.   Therapy Documentation Precautions:  Precautions Precautions: Fall Restrictions Weight Bearing Restrictions: No Vital Signs: Therapy Vitals Temp: 98.3 F (36.8 C) Temp Source: Oral Pulse Rate: 98 Resp: 15 BP: 117/89 Patient Position (if appropriate): Lying Oxygen Therapy SpO2: 95 % O2 Device: Room Air Pain: Pain Assessment Pain Scale: 0-10 Pain Score: 6  Pain Type: Acute pain Pain Location: Bladder Pain Orientation: Anterior Pain Descriptors / Indicators: Discomfort Pain Frequency: Intermittent Pain Onset: Gradual Patients Stated Pain Goal: 2 Pain Intervention(s): Medication (See eMAR) ADL: ADL Eating: Set up (sandwich; anticipate using utensils may require min assist) Where Assessed-Eating: Bed level Where Assessed-Grooming:  (pt politely refused) Upper Body Bathing:  (pt politely refused) Lower Body Bathing:  (pt politely refused) Upper Body Dressing: Supervision/safety, Setup Where Assessed-Upper Body Dressing: Edge of bed Lower Body Dressing: Maximal assistance, Minimal cueing Where Assessed-Lower Body Dressing: Edge of bed Toileting: Dependent (in/out cath due to fatigue; completed with assist of nursing at bed level; pt reports she usually does cath sitting on toilet)  Therapy/Group: Individual Therapy  Zadiel Leyh A Jolinda Pinkstaff 03/10/2021, 4:10 PM

## 2021-03-09 NOTE — Progress Notes (Signed)
PROGRESS NOTE   Subjective/Complaints:  Pt reports having bladder issues- needs cathing q4 hours- did herself at home even throughout the night- but has "UTI" and having leakage between caths.    ROS:  Pt denies SOB, abd pain, CP, N/V/C/D, and vision changes    Objective:   No results found. Recent Labs    03/09/21 0554  WBC 11.8*  HGB 14.5  HCT 42.8  PLT 302   Recent Labs    03/08/21 0220 03/09/21 0554  NA 138 133*  K 3.2* 4.2  CL 101 101  CO2 30 25  GLUCOSE 134* 109*  BUN 15 13  CREATININE 0.51 0.50  CALCIUM 9.5 8.9    Intake/Output Summary (Last 24 hours) at 03/09/2021 1014 Last data filed at 03/09/2021 0830 Gross per 24 hour  Intake 180 ml  Output 469 ml  Net -289 ml        Physical Exam: Vital Signs Blood pressure 113/79, pulse 96, temperature 99.6 F (37.6 C), temperature source Oral, resp. rate 19, height 5\' 6"  (1.676 m), weight 56.4 kg, SpO2 96 %.   General: awake, alert, appropriate, sitting u pin bed; trace delayed responses; NAD HENT: conjugate gaze; oropharynx moist CV: regular rate; no JVD Pulmonary: CTA B/L; no W/R/R- good air movement GI: soft, NT, ND, (+)BS Psychiatric: appropriate- slightly flat affect Neurological: Ox3- trace delayed responses Musculoskeletal:- hyperextension of thumbs B/L  Neurological:     Comments: Flat affect with soft voice. Nystagmus right lateral vision. Right sided weakness with tight heel cord. 2+DTRs. RUE 3/5, LUE 4/5, RLE 3/5 HF and otherwise 4/5, LLE 4/5. Sensation intact.  No Hoffman's or clonus B/L; no increased tone on exam     Assessment/Plan: 1. Functional deficits which require 3+ hours per day of interdisciplinary therapy in a comprehensive inpatient rehab setting. Physiatrist is providing close team supervision and 24 hour management of active medical problems listed below. Physiatrist and rehab team continue to assess barriers to  discharge/monitor patient progress toward functional and medical goals  Care Tool:  Bathing              Bathing assist       Upper Body Dressing/Undressing Upper body dressing   What is the patient wearing?: Hospital gown only    Upper body assist Assist Level: Maximal Assistance - Patient 25 - 49%    Lower Body Dressing/Undressing Lower body dressing      What is the patient wearing?: Incontinence brief     Lower body assist Assist for lower body dressing: Maximal Assistance - Patient 25 - 49%     Toileting Toileting    Toileting assist Assist for toileting: Dependent - Patient 0%     Transfers Chair/bed transfer  Transfers assist           Locomotion Ambulation   Ambulation assist              Walk 10 feet activity   Assist           Walk 50 feet activity   Assist           Walk 150 feet activity   Assist  Walk 10 feet on uneven surface  activity   Assist           Wheelchair     Assist               Wheelchair 50 feet with 2 turns activity    Assist            Wheelchair 150 feet activity     Assist          Blood pressure 113/79, pulse 96, temperature 99.6 F (37.6 C), temperature source Oral, resp. rate 19, height 5\' 6"  (1.676 m), weight 56.4 kg, SpO2 96 %.  Medical Problem List and Plan: 1. Functional deficits secondary to MS/AKI             -patient may shower             -ELOS/Goals: 10-14 days modI  -first day of evaluations- Con't CIR- PT and OT 2.  Impaired mobility: Continue             -antiplatelet therapy: N/A.  3. Headaches: Resume Excedrin prn for HA.  --oxycodone prn.  4. Mood: LCSW to follow for evaluation and support.              -antipsychotic agents: N/A 5. Neuropsych: This patient is capable of making decisions on her own behalf. 6. Skin/Wound Care: Routine pressure relief measures.  7. Fluids/Electrolytes/Nutrition: Monitor I/O. Intake  poor and variable at baseline per family. 8. E coli UTI: On keflex D #1/7-10             --pyridium added to help with urethritis.   9. Neurogenic bladder: Will schedule every 4 hours caths as at home to prevent spasms/incontinence 1/5- will increase Oxybutynin to 50 mg TID since daily too low of a dose- will reassess if needs to switch to Myrbetriq in next few days. .   10. Hypokalemia: Question etiology--has been supplemented for the past 2 days.             --recheck level in am.  1/5- K+ 4.2- doing better- will recheck Monday 11. Leucocytosis: Likely due to UTI--recheck CBC in am.   1/5- WBC 11.8- stable- will recheck Monday or earlier if more Sx's/fever 12. Chronic fatigue: Ampyra ineffective and unable to tolerate stimulants due to SE.  13. Tachycardia: TSH normal : start propanolol 10mg  HS  1/5- HR 90s-low 100s- will monitor with addition of Propranolol.     LOS: 1 days A FACE TO FACE EVALUATION WAS PERFORMED  Crystal Park 03/09/2021, 10:14 AM

## 2021-03-09 NOTE — Progress Notes (Signed)
Inpatient Rehabilitation  Patient information reviewed and entered into eRehab system by Calvin Jablonowski Chanette Demo, OTR/L.   Information including medical coding, functional ability and quality indicators will be reviewed and updated through discharge.    

## 2021-03-10 DIAGNOSIS — G35 Multiple sclerosis: Secondary | ICD-10-CM | POA: Diagnosis not present

## 2021-03-10 MED ORDER — PROPRANOLOL HCL 20 MG PO TABS
20.0000 mg | ORAL_TABLET | Freq: Every day | ORAL | Status: DC
  Administered 2021-03-10 – 2021-03-22 (×13): 20 mg via ORAL
  Filled 2021-03-10 (×13): qty 1

## 2021-03-10 MED ORDER — SORBITOL 70 % SOLN: 30.0000 mL | Freq: Once | Status: DC

## 2021-03-10 NOTE — Progress Notes (Signed)
Patient rested well throughout the night. Patient has order for in & out q4hrs. Headache this morning nurse administered PRN tylenol.

## 2021-03-10 NOTE — Progress Notes (Signed)
Physical Therapy Session Note  Patient Details  Name: Crystal Park MRN: [id number] Date of Birth: 1960/12/08  Today's Date: 03/10/2021 PT Individual Time: 9892-1194 PT Individual Time Calculation (min): 58 min   Short Term Goals: Week 1:  PT Short Term Goal 1 (Week 1): Pt will perform sit <>stand transfers w/CGA and LRAD consistently PT Short Term Goal 2 (Week 1): Pt will ambulate 25' w/LRAD and CGA PT Short Term Goal 3 (Week 1): Pt will intiate stair training w/min A to prepare for DC home   Skilled Therapeutic Interventions/Progress Updates:   Pt received supine in bed and agreeable to PT. Supine>sit transfer with supervision assist and cues for safety for reciprocal scoot to position BLE on floor. Stand pivot transfer to [facility] with CGA for safety with RW. Pt transported to rehab gym in [facility]. Dynamic standing balance/tolerance while engaged in gross motor task of Wii bowling; pt able to tolerate standing for only 1 frame at a time prior to requiring rest break. Able to perform 5 frames in standing with 1-2 UE Support on RW. Sit<>stand from [facility] with CGA for safety. Gait training with RW x 22ft with min assist for safety with cues for AD management in turns. No knee instability noted through but RLE limited foot clearance and L knee sustained in slight flexion. Pt returned to room and performed stand pivot transfer to bed with RW and CGA for safety. Sit>supine completed with min assist to the RLE, and left supine in bed with call bell in reach and all needs met.         Therapy Documentation Precautions:  Precautions Precautions: Fall Restrictions Weight Bearing Restrictions: No    Vital Signs: Therapy Vitals Temp: 98.3 F (36.8 C) Temp Source: Oral Pulse Rate: 98 Resp: 15 BP: 117/89 Patient Position (if appropriate): Lying Oxygen Therapy SpO2: 95 % O2 Device: Room Air Pain: Pain Assessment Pain Scale: 0-10 Pain Score: 6  Pain Type: Acute pain Pain Location: Bladder Pain  Orientation: Anterior Pain Descriptors / Indicators: Discomfort Pain Frequency: Intermittent Pain Onset: Gradual Patients Stated Pain Goal: 2 Pain Intervention(s): Medication (See eMAR)    Therapy/Group: Individual Therapy  Lorie Phenix 03/10/2021, 4:35 PM

## 2021-03-10 NOTE — Progress Notes (Signed)
Occupational Therapy Session Note  Patient Details  Name: Crystal Park MRN: [id number] Date of Birth: 03-21-1960  Today's Date: 03/10/2021 OT Individual Time: 0920-1015 OT Individual Time Calculation (min): 55 min    Short Term Goals: Week 1:  OT Short Term Goal 1 (Week 1): Pt will complete toileting with min assist for both bowel and bladder self cath at [facility]. OT Short Term Goal 2 (Week 1): Pt will complete LB dressing with min assist OT Short Term Goal 3 (Week 1): Pt will complete UB/LB bathing using AE as needed with CGA. OT Short Term Goal 4 (Week 1): Pt will complete BSC transfer with CGA.  Skilled Therapeutic Interventions/Progress Updates:  Skilled OT intervention completed with focus on functional transfers, endurance, BP management, cognitive strategies. Pt received in long sitting in bed with family in room. Pt declining self-care tasks stating "that's gonna take a lot of energy." Pt expressing feeling of "bloating" with therapist notifying RN that pt felt the need to empty bladder as pt is used to self-cathing at home. Pt agreeable to out of room therapy. Pt using hands to pick up RLE off pillow, then bed mobility with HOB elevated, with supervision with increased time needed for transition. Pt seated EOB, denied dizziness. Completed doffing of grip socks, donning of regular socks using figure 4 position, with supervision. Pt stating she has a "foot up" brace that she wears over her tennis shoe, with pt requiring min A for shoes and brace. Pt sit > stand using RW with CGA then stand pivot to w/c with CGA. Pt transported to gym with total A in w/c for energy conservation. Pt reporting feeling lightheaded, with vitals checked seated at 120/91, HR 114. Pt vitals assess in standing, BP 109/76, HR 126 with no orthostatics but high resting HR noted. Pt denying feeling of anxiousness or heart racing. Pt agreeable to attempt pipe tower in standing with pt able to place 3 pieces in about 1 min,  before pt reporting need to sit down due to weakness. Checked BP seated at 131/93, HR 121. Pt stating that the feeling subsides when she sits, with education provided on pt's decreased endurance presumably from weakness and period of time laying in bed. Pt requesting to discontinue therapy, however agreeable to stay seated with back unsupported from w/c to promote postural control vs standing. Pt initially with mod difficulty completing tower, with tendency to pick up wrong pieces, or orient them wrong way with verbal cues needed to correct. Pt improved once further in activity and able to self-correct errors. Back in room, pt left seated in w/c and pt care handed off to NT to assist pt with voiding at therapist departure.   Therapy Documentation Precautions:  Precautions Precautions: Fall Restrictions Weight Bearing Restrictions: No  Pain: No c/o pain   Therapy/Group: Individual Therapy  [name] [name] 03/10/2021, 7:38 AM

## 2021-03-10 NOTE — Progress Notes (Signed)
PROGRESS NOTE   Subjective/Complaints:  Pt reports bladder leakage MUCH better- not resolved, but a whole lot better.  LBM 1 week ago- constipated- willing to do Sorbitol after therapy today- ordered.  Feels bloated.    ROS:  Pt denies SOB, abd pain, CP, N/V/(+) C/D, and vision changes   Objective:   No results found. Recent Labs    03/09/21 0554  WBC 11.8*  HGB 14.5  HCT 42.8  PLT 302   Recent Labs    03/08/21 0220 03/09/21 0554  NA 138 133*  K 3.2* 4.2  CL 101 101  CO2 30 25  GLUCOSE 134* 109*  BUN 15 13  CREATININE 0.51 0.50  CALCIUM 9.5 8.9    Intake/Output Summary (Last 24 hours) at 03/10/2021 0802 Last data filed at 03/10/2021 0145 Gross per 24 hour  Intake 238 ml  Output 750 ml  Net -512 ml        Physical Exam: Vital Signs Blood pressure 122/87, pulse (!) 107, temperature 98.3 F (36.8 C), temperature source Oral, resp. rate 14, height 5\' 6"  (1.676 m), weight 56.4 kg, SpO2 93 %.    General: awake, alert, appropriate, NAD HENT: conjugate gaze; oropharynx moist tachycardic rate; no JVD Pulmonary: CTA B/L; no W/R/R- good air movement GI: soft, NT; slightly distended; hypoactive BS Psychiatric: appropriate; sleepy Neurological: alert- delayed responses; no increased tone Musculoskeletal:- hyperextension of thumbs B/L  Neurological:     Comments: Flat affect with soft voice. Nystagmus right lateral vision. Right sided weakness with tight heel cord. 2+DTRs. RUE 3/5, LUE 4/5, RLE 3/5 HF and otherwise 4/5, LLE 4/5. Sensation intact.  No Hoffman's or clonus B/L; no increased tone on exam Skin: IV on L AC fossa doesn't look great- will remove   Assessment/Plan: 1. Functional deficits which require 3+ hours per day of interdisciplinary therapy in a comprehensive inpatient rehab setting. Physiatrist is providing close team supervision and 24 hour management of active medical problems  listed below. Physiatrist and rehab team continue to assess barriers to discharge/monitor patient progress toward functional and medical goals  Care Tool:  Bathing              Bathing assist Assist Level: Moderate Assistance - Patient 50 - 74%     Upper Body Dressing/Undressing Upper body dressing   What is the patient wearing?: Hospital gown only    Upper body assist Assist Level: Supervision/Verbal cueing    Lower Body Dressing/Undressing Lower body dressing      What is the patient wearing?: Incontinence brief     Lower body assist Assist for lower body dressing: Maximal Assistance - Patient 25 - 49%     Toileting Toileting    Toileting assist Assist for toileting: Dependent - Patient 0%     Transfers Chair/bed transfer  Transfers assist     Chair/bed transfer assist level: Minimal Assistance - Patient > 75%     Locomotion Ambulation   Ambulation assist      Assist level: Minimal Assistance - Patient > 75% Assistive device: Walker-rolling Max distance: 10'   Walk 10 feet activity   Assist     Assist level: Minimal Assistance -  Patient > 75% Assistive device: Walker-rolling   Walk 50 feet activity   Assist Walk 50 feet with 2 turns activity did not occur: Safety/medical concerns (Fatigue, MS exacerbation)         Walk 150 feet activity   Assist Walk 150 feet activity did not occur: Safety/medical concerns (Fatigue, MS exacerbation)         Walk 10 feet on uneven surface  activity   Assist Walk 10 feet on uneven surfaces activity did not occur: Safety/medical concerns (Fatigue, MS exacerbation)         Wheelchair     Assist Is the patient using a wheelchair?: Yes Type of Wheelchair: Manual    Wheelchair assist level: Dependent - Patient 0%      Wheelchair 50 feet with 2 turns activity    Assist        Assist Level: Dependent - Patient 0%   Wheelchair 150 feet activity     Assist       Assist Level: Dependent - Patient 0%   Blood pressure 122/87, pulse (!) 107, temperature 98.3 F (36.8 C), temperature source Oral, resp. rate 14, height 5\' 6"  (1.676 m), weight 56.4 kg, SpO2 93 %.  Medical Problem List and Plan: 1. Functional deficits secondary to MS/AKI             -patient may shower             -ELOS/Goals: 10-14 days modI  Con't CIR- PT and OT- team conference Tuesday to determine length of stay 2.  Impaired mobility: Continue             -antiplatelet therapy: N/A.  3. Headaches: Resume Excedrin prn for HA.  --oxycodone prn.  4. Mood: LCSW to follow for evaluation and support.              -antipsychotic agents: N/A 5. Neuropsych: This patient is capable of making decisions on her own behalf. 6. Skin/Wound Care: Routine pressure relief measures.  7. Fluids/Electrolytes/Nutrition: Monitor I/O. Intake poor and variable at baseline per family. 8. E coli UTI: On keflex D #1/7-10             --pyridium added to help with urethritis.  1/6- dysuria pain better/not resolved 9. Neurogenic bladder: Will schedule every 4 hours caths as at home to prevent spasms/incontinence 1/5- will increase Oxybutynin to 50 mg TID since daily too low of a dose- will reassess if needs to switch to Myrbetriq in next few days.  1/6- Bladder leakage MUCH better in between caths- con't q4 hours for now.   10. Hypokalemia: Question etiology--has been supplemented for the past 2 days.             --recheck level in am.  1/5- K+ 4.2- doing better- will recheck Monday 11. Leucocytosis: Likely due to UTI--recheck CBC in am.   1/5- WBC 11.8- stable- will recheck Monday or earlier if more Sx's/fever 12. Chronic fatigue: Ampyra ineffective and unable to tolerate stimulants due to SE.  13. Tachycardia: TSH normal : start propanolol 10mg  HS  1/5- HR 90s-low 100s- will monitor with addition of Propranolol.   1/6- HR mid 100s this AM- will increase Propanolol to 20 mg QHS 14. Constipation  1/6-  LBM ~ 1 week ago per pt- will give Sorbitol x1 after therapy today.     LOS: 2 days A FACE TO FACE EVALUATION WAS PERFORMED  Momo Braun 03/10/2021, 8:02 AM

## 2021-03-11 MED ORDER — SORBITOL 70 % SOLN
30.0000 mL | Freq: Once | Status: AC
  Administered 2021-03-11: 30 mL via ORAL
  Filled 2021-03-11: qty 30

## 2021-03-11 NOTE — H&P (Signed)
PROGRESS NOTE   Subjective/Complaints:  Pt reports small BM yesterday with Sorbitol- but still feels constipated. Thinks sister might be sick- hasn't seen lately.  IV wasn't removed.  Will verify with nursing.  Wants more Sorbitol.   ROS:  Pt denies SOB, abd pain, CP, N/V/(+) C/D, and vision changes   Objective:   No results found. Recent Labs    03/09/21 0554  WBC 11.8*  HGB 14.5  HCT 42.8  PLT 302   Recent Labs    03/09/21 0554  NA 133*  K 4.2  CL 101  CO2 25  GLUCOSE 109*  BUN 13  CREATININE 0.50  CALCIUM 8.9    Intake/Output Summary (Last 24 hours) at 03/11/2021 1243 Last data filed at 03/11/2021 0814 Gross per 24 hour  Intake 437 ml  Output 456 ml  Net -19 ml        Physical Exam: Vital Signs Blood pressure 110/68, pulse 96, temperature 98.1 F (36.7 C), temperature source Oral, resp. rate 18, height 5\' 6"  (1.676 m), weight 56.4 kg, SpO2 94 %.     General: awake, alert, appropriate, fatigued- laying supine in bed; pale; NAD HENT: conjugate gaze; oropharynx moist CV: regular rate; no JVD Pulmonary: CTA B/L; no W/R/R- good air movement GI: soft, NT, ND, (+)BS- hypoactive Psychiatric: appropriate Neurological: Ox3 -delayed responses- Musculoskeletal:- hyperextension of thumbs B/L  Neurological:     Comments: Flat affect with soft voice. Nystagmus right lateral vision. Right sided weakness with tight heel cord. 2+DTRs. RUE 3/5, LUE 4/5, RLE 3/5 HF and otherwise 4/5, LLE 4/5. Sensation intact.  No Hoffman's or clonus B/L; no increased tone on exam Skin: IV on L AC fossa doesn't look great- will remove   Assessment/Plan: 1. Functional deficits which require 3+ hours per day of interdisciplinary therapy in a comprehensive inpatient rehab setting. Physiatrist is providing close team supervision and 24 hour management of active medical problems listed below. Physiatrist and rehab team continue  to assess barriers to discharge/monitor patient progress toward functional and medical goals  Care Tool:  Bathing              Bathing assist Assist Level: Moderate Assistance - Patient 50 - 74%     Upper Body Dressing/Undressing Upper body dressing   What is the patient wearing?: Hospital gown only    Upper body assist Assist Level: Supervision/Verbal cueing    Lower Body Dressing/Undressing Lower body dressing      What is the patient wearing?: Incontinence brief     Lower body assist Assist for lower body dressing: Maximal Assistance - Patient 25 - 49%     Toileting Toileting    Toileting assist Assist for toileting: Dependent - Patient 0%     Transfers Chair/bed transfer  Transfers assist     Chair/bed transfer assist level: Minimal Assistance - Patient > 75%     Locomotion Ambulation   Ambulation assist      Assist level: Minimal Assistance - Patient > 75% Assistive device: Walker-rolling Max distance: 10'   Walk 10 feet activity   Assist     Assist level: Minimal Assistance - Patient > 75% Assistive device: Walker-rolling  Walk 50 feet activity   Assist Walk 50 feet with 2 turns activity did not occur: Safety/medical concerns (Fatigue, MS exacerbation)         Walk 150 feet activity   Assist Walk 150 feet activity did not occur: Safety/medical concerns (Fatigue, MS exacerbation)         Walk 10 feet on uneven surface  activity   Assist Walk 10 feet on uneven surfaces activity did not occur: Safety/medical concerns (Fatigue, MS exacerbation)         Wheelchair     Assist Is the patient using a wheelchair?: Yes Type of Wheelchair: Manual    Wheelchair assist level: Dependent - Patient 0%      Wheelchair 50 feet with 2 turns activity    Assist        Assist Level: Dependent - Patient 0%   Wheelchair 150 feet activity     Assist      Assist Level: Dependent - Patient 0%   Blood pressure  110/68, pulse 96, temperature 98.1 F (36.7 C), temperature source Oral, resp. rate 18, height 5\' 6"  (1.676 m), weight 56.4 kg, SpO2 94 %.  Medical Problem List and Plan: 1. Functional deficits secondary to MS/AKI             -patient may shower             -ELOS/Goals: 10-14 days modI  Continue CIR- PT, OT and SLP 2.  Impaired mobility: Continue             -antiplatelet therapy: N/A.  3. Headaches: Resume Excedrin prn for HA.  --oxycodone prn. 1/7- doesn't c/o pain- con't regimen  4. Mood: LCSW to follow for evaluation and support.              -antipsychotic agents: N/A 5. Neuropsych: This patient is capable of making decisions on her own behalf. 6. Skin/Wound Care: Routine pressure relief measures.  7. Fluids/Electrolytes/Nutrition: Monitor I/O. Intake poor and variable at baseline per family. 8. E coli UTI: On keflex D #1/7-10             --pyridium added to help with urethritis.  1/6- dysuria pain better/not resolved 1/7- doing better- con't regimen 9. Neurogenic bladder: Will schedule every 4 hours caths as at home to prevent spasms/incontinence 1/5- will increase Oxybutynin to 50 mg TID since daily too low of a dose- will reassess if needs to switch to Myrbetriq in next few days.  1/6- Bladder leakage MUCH better in between caths- con't q4 hours for now.   1/7- almost resolved- con't caths q4 hours 10. Hypokalemia: Question etiology--has been supplemented for the past 2 days.             --recheck level in am.  1/5- K+ 4.2- doing better- will recheck Monday 11. Leucocytosis: Likely due to UTI--recheck CBC in am.   1/5- WBC 11.8- stable- will recheck Monday or earlier if more Sx's/fever 12. Chronic fatigue: Ampyra ineffective and unable to tolerate stimulants due to SE.  13. Tachycardia: TSH normal : start propanolol 10mg  HS  1/5- HR 90s-low 100s- will monitor with addition of Propranolol.   1/6- HR mid 100s this AM- will increase Propanolol to 20 mg QHS  1/7- HR better- in  90s- con't regimen 14. Constipation  1/6- LBM ~ 1 week ago per pt- will give Sorbitol x1 after therapy today.    1/7- will give another dose of Sorbitol after therapy today.   LOS: 3 days  A FACE TO FACE EVALUATION WAS PERFORMED  Jed Kutch 03/11/2021, 12:43 PM

## 2021-03-11 NOTE — IPOC Note (Signed)
Overall Plan of Care [facility]) [name] MRN: [id number] DOB: 1960-12-21  Admitting Diagnosis: Multiple sclerosis [facility])  Hospital Problems: Principal Problem:   Multiple sclerosis (HCC)     Functional Problem List: Nursing Bladder, Bowel, Edema, Endurance, Medication Management, Pain, Safety, Sensory  PT Balance, Endurance, Motor, Pain, Safety  OT Balance, Cognition, Endurance, Vision, Motor, Pain  SLP    TR         Basic ADLs: OT Eating, Grooming, Bathing, Dressing, Toileting     Advanced  ADLs: OT       Transfers: PT Bed Mobility, Bed to Chair, Car, Occupational psychologist, Research scientist (life sciences): PT Ambulation, Stairs, Psychologist, prison and probation services     Additional Impairments: OT Fuctional Use of Upper Extremity  SLP        TR      Anticipated Outcomes Item Anticipated Outcome  Self Feeding setup  Swallowing      Basic self-care  mod I- supervision  Toileting  mod I   Bathroom Transfers mod I  Bowel/Bladder  min assist  Transfers  Mod I  Locomotion  S*  Communication     Cognition     Pain  < 3  Safety/Judgment  min assist and no falls   Therapy Plan: PT Intensity: Minimum of 1-2 x/day ,45 to 90 minutes PT Frequency: Total of 15 hours over 7 days of combined therapies PT Duration Estimated Length of Stay: 10-14 days OT Intensity: Minimum of 1-2 x/day, 45 to 90 minutes OT Frequency: 5 out of 7 days OT Duration/Estimated Length of Stay: 10-14 days     Due to the current state of emergency, patients may not be receiving their 3-hours of Medicare-mandated therapy.   Team Interventions: Nursing Interventions Patient/Family Education, Bladder Management, Bowel Management, Disease Management/Prevention, Pain Management, Medication Management, Discharge Planning  PT interventions Ambulation/gait training, Community reintegration, DME/adaptive equipment instruction, Neuromuscular re-education, Psychosocial support, Stair  training, UE/LE Strength taining/ROM, Wheelchair propulsion/positioning, UE/LE Coordination activities, Therapeutic Activities, Skin care/wound management, Pain management, Functional electrical stimulation, Discharge planning, Balance/vestibular training, Cognitive remediation/compensation, Disease management/prevention, Functional mobility training, Patient/family education, Splinting/orthotics, Therapeutic Exercise, Visual/perceptual remediation/compensation  OT Interventions Balance/vestibular training, Discharge planning, Pain management, Self Care/advanced ADL retraining, Therapeutic Activities, UE/LE Coordination activities, Cognitive remediation/compensation, Disease mangement/prevention, Functional mobility training, Patient/family education, Therapeutic Exercise, Visual/perceptual remediation/compensation, DME/adaptive equipment instruction, Neuromuscular re-education, Psychosocial support, UE/LE Strength taining/ROM, Splinting/orthotics, Wheelchair propulsion/positioning  SLP Interventions    TR Interventions    SW/CM Interventions Discharge Planning, Psychosocial Support, Patient/Family Education   Barriers to Discharge MD  Medical stability, Home enviroment access/loayout, Incontinence, Neurogenic bowel and bladder, and MS fatigue and MS1  Nursing Decreased caregiver support, Home environment access/layout, Neurogenic Bowel & Bladder, Incontinence, Lack of/limited family support, Medication compliance Lives in 1 level home with no steps to enter front, 1 step in garage. Lives with spouse who can provide min assist at discharge.  PT Neurogenic Bowel & Bladder    OT      SLP      SW Insurance for SNF coverage     Team Discharge Planning: Destination: PT-Home ,OT- Home , SLP-  Projected Follow-up: PT-Home health PT, OT-  Outpatient OT, SLP-  Projected Equipment Needs: PT-To be determined, OT- To be determined, SLP-  Equipment Details: PT- , OT-  Patient/family involved in discharge  planning: PT- Patient,  OT-Family member/caregiver, Patient, SLP-   MD ELOS: 10-14 days Medical Rehab Prognosis:  Good Assessment: Pt is a 61 yr old  female with MS with acute exacerbation after GI bug and decline in function- also has neurogenic bladder- cathed q4-5 hours at home- q4 hours here; and a UTI on PO ABX- also bladder leakage; and constipation- LBM 1 week ago as of yesterday-  Goals mod I to min A    See Team Conference Notes for weekly updates to the plan of care

## 2021-03-11 NOTE — Progress Notes (Signed)
Physical Therapy Session Note  Patient Details  Name: Crystal Park MRN: [id number] Date of Birth: 03/06/1960  Today's Date: 03/11/2021 PT Individual Time: 0277-4128; 1000-1055 PT Individual Time Calculation (min): 55 min and 55 mins  Short Term Goals: Week 1:  PT Short Term Goal 1 (Week 1): Pt will perform sit <>stand transfers w/CGA and LRAD consistently PT Short Term Goal 2 (Week 1): Pt will ambulate 25' w/LRAD and CGA PT Short Term Goal 3 (Week 1): Pt will intiate stair training w/min A to prepare for DC home  Skilled Therapeutic Interventions/Progress Updates:    Session 1: Patient received supine in bed, RN and NT I&O cathing patient. Agreeable to PT afterward- she denies pain. Patient able to come sit edge of bed with supervision and HOB slightly elevated. CGA stand pivot to wc with no AD. PT transporting patient in wc to therapy gym for time management and energy conservation. She completed a dynamic standing balance and endurance task playing cornhole with U UE support on RW + CGA. She was able to remain standing for ~2 mins first bout with no overt LOB. Progressed to standing on foam. Patient able to remain standing ~3 mins, but left posterior weight displacement noted with inappropriate use of suspensory strategy to attempt to regain balance. Patient required extended seated rest breaks between bouts due to fatigue, which she rates as an 8/10. She ambulated ~28ft from her doorway to her bed with RW and CGA. Returning supine with supervision. PT appropriately inflating wc cushion. Bed alarm on, call light within reach.   Session 2: Patient received supine in bed, agreeable to PT. She denies pain, but endorses fatigue. Able to come sit edge of bed with CGA. CGA stand pivot to wc with no AD. PT transporting patient in wc to therapy gym for time management and energy conservation. She ambulated 4x58ft with RW and CGA. Slow gait speed, R LE circumduction with flat foot noted. She reports  that she typically ambulates ~33ft at a time at home. Extended seated rest break between bouts with reports of ~7/10 fatigue. Patient completing 4x1 min on Kinetron 70cm/s with extended rest break between. Seated therex as follows with 1# dowel: chest press, shoulder press (3x10). Patient returning to room in wc. CGA stand pivot to bed, bed alarm on, call light within reach.   Therapy Documentation Precautions:  Precautions Precautions: Fall Restrictions Weight Bearing Restrictions: No     Therapy/Group: Individual Therapy  Elizebeth Koller, PT, DPT, CBIS 03/11/2021, 7:45 AM

## 2021-03-11 NOTE — Progress Notes (Signed)
Occupational Therapy Session Note  Patient Details  Name: Crystal Park MRN: [id number] Date of Birth: 13-Jul-1960  Today's Date: 03/11/2021 OT Individual Time: 4799-8721 OT Individual Time Calculation (min): 53 min    Short Term Goals: Week 1:  OT Short Term Goal 1 (Week 1): Pt will complete toileting with min assist for both bowel and bladder self cath at [facility]. OT Short Term Goal 2 (Week 1): Pt will complete LB dressing with min assist OT Short Term Goal 3 (Week 1): Pt will complete UB/LB bathing using AE as needed with CGA. OT Short Term Goal 4 (Week 1): Pt will complete BSC transfer with CGA.  Skilled Therapeutic Interventions/Progress Updates:    Pt received semi-reclined in bed with family present, denies pain, agreeable to therapy. Session focus on self-care retraining, activity tolerance, B grip strength/FMC, transfer retraining in prep for improved ADL/IADL/func mobility performance + decreased caregiver burden. Came to sitting EOB on her R with close S. Completed ambulatory toilet transfer with CGA and RW. CGA overall during toileting tasks, but noted to be incontinent of bladder in brief and required total A to change out. Further void of bladder. Ambulated > sink in similar manner and able to complete hand hygiene and oral care with set-up A. Total A to don B shoes and L toe up brace but pt able to later doff with S. NT present for vitals.   Total A w/c transport to and from gym for time management and energy conservation. Pt requesting to work on "writing."   Assessed B grip strength and administered 9HPT with the below results:  R:  17, 20, average of 18.5 lbs; 39 secs  L: 23, 22; average of 22.5 lbs; 41 secs  Issued yellow soft theraputty and printed HEP to maintain B grip strength. Pt reports difficulty with legible writing and opening small packets. Pt practiced writing personal information x3 (name, address, #) and rated legibility at 75% vs 100% pre hospitalization.  Encouraged to pt practice daily.  Pt left semi-reclined in bed with daughter present with bed alarm engaged, call bell in reach, and all immediate needs met.    Therapy Documentation Precautions:  Precautions Precautions: Fall Restrictions Weight Bearing Restrictions: No  Pain:  denies ADL: See Care Tool for more details.   Therapy/Group: Individual Therapy  Volanda Napoleon MS, OTR/L  03/11/2021, 7:01 AM

## 2021-03-12 NOTE — Progress Notes (Signed)
Pt refused I/o cath at 2145. Voided in briefs and  was bladder scanned for 131 ml at 0200. I/O cath pt for 150 ml. Pt tolerated well.

## 2021-03-12 NOTE — Progress Notes (Signed)
Pt has had multiple incontinent episodes this shift-IO cath attempts made by three Rns have been unsuccessful-recent bladder scan shows only 11ml-pt states she has no urge to pee at this time

## 2021-03-13 ENCOUNTER — Ambulatory Visit: Payer: Managed Care, Other (non HMO) | Admitting: Occupational Therapy

## 2021-03-13 ENCOUNTER — Ambulatory Visit: Payer: Managed Care, Other (non HMO) | Admitting: Physical Therapy

## 2021-03-13 DIAGNOSIS — A499 Bacterial infection, unspecified: Secondary | ICD-10-CM

## 2021-03-13 DIAGNOSIS — N390 Urinary tract infection, site not specified: Secondary | ICD-10-CM

## 2021-03-13 DIAGNOSIS — N319 Neuromuscular dysfunction of bladder, unspecified: Secondary | ICD-10-CM

## 2021-03-13 DIAGNOSIS — E876 Hypokalemia: Secondary | ICD-10-CM

## 2021-03-13 DIAGNOSIS — K5901 Slow transit constipation: Secondary | ICD-10-CM

## 2021-03-13 LAB — CBC
HCT: 38 % (ref 36.0–46.0)
Hemoglobin: 12.9 g/dL (ref 12.0–15.0)
MCH: 32.2 pg (ref 26.0–34.0)
MCHC: 33.9 g/dL (ref 30.0–36.0)
MCV: 94.8 fL (ref 80.0–100.0)
Platelets: 348 10*3/uL (ref 150–400)
RBC: 4.01 MIL/uL (ref 3.87–5.11)
RDW: 13 % (ref 11.5–15.5)
WBC: 11.7 10*3/uL — ABNORMAL HIGH (ref 4.0–10.5)
nRBC: 0 % (ref 0.0–0.2)

## 2021-03-13 LAB — BASIC METABOLIC PANEL
Anion gap: 12 (ref 5–15)
BUN: 14 mg/dL (ref 6–20)
CO2: 25 mmol/L (ref 22–32)
Calcium: 9.5 mg/dL (ref 8.9–10.3)
Chloride: 99 mmol/L (ref 98–111)
Creatinine, Ser: 0.51 mg/dL (ref 0.44–1.00)
GFR, Estimated: >60 mL/min (ref >60)
Glucose, Bld: 115 mg/dL — ABNORMAL HIGH (ref 70–99)
Potassium: 3.9 mmol/L (ref 3.5–5.1)
Sodium: 136 mmol/L (ref 135–145)

## 2021-03-13 MED ORDER — SENNOSIDES-DOCUSATE SODIUM 8.6-50 MG PO TABS
2.0000 | ORAL_TABLET | Freq: Every day | ORAL | Status: DC
  Administered 2021-03-13: 2 via ORAL
  Filled 2021-03-13: qty 2

## 2021-03-13 MED ORDER — MIRABEGRON ER 25 MG PO TB24
25.0000 mg | ORAL_TABLET | Freq: Every day | ORAL | Status: DC
  Administered 2021-03-13 – 2021-03-15 (×3): 25 mg via ORAL
  Filled 2021-03-13 (×3): qty 1

## 2021-03-13 NOTE — Progress Notes (Signed)
Physical Therapy Session Note  Patient Details  Name: Crystal Park MRN: [id number] Date of Birth: 11-04-1960  Today's Date: 03/13/2021 PT Individual Time: 0900-0946 PT Individual Time Calculation (min): 46 min   Short Term Goals: Week 1:  PT Short Term Goal 1 (Week 1): Pt will perform sit <>stand transfers w/CGA and LRAD consistently PT Short Term Goal 2 (Week 1): Pt will ambulate 25' w/LRAD and CGA PT Short Term Goal 3 (Week 1): Pt will intiate stair training w/min A to prepare for DC home  Skilled Therapeutic Interventions/Progress Updates:    pt received in bed and agreeable to therapy, her husband and daughter present. Pt reports no pain, but extreme fatigue throughout session. Extended rest breaks required, used to discuss pacing, energy conservation, "spoon theory" of chronic illness, and providing emotional support as pt expressed frustration and emotions related to illness.   Transfers: Bed mobility with Hob elevated with supervision. Stand pivot transfer EOB<>w/c and mat table>w/c with CGA, no AD. Sit to stand CGA throughout session to RW. Donned shoes with tot A for time.  Gait: Pt ambulated with RW, foot up brace in place x 25 ft and x 45 ft, CGA. W/c follow in case of fatigue. Pt demoes narrow BOS, internal rotation of LLE, and flexed knee posture during stance. Able to maintain largely normal gait pattern on RLE, despite new weakness.   Therapeutic activity: Pt directed in seated marches, 3 x 10, demoes motor impersistence on RLE with inability to reach full ROM at this time. Extended rest breaks between sets.   Pt returned to room and to bed as described above and was left with all needs in reach and alarm active, her family present.  Therapy Documentation Precautions:  Precautions Precautions: Fall Restrictions Weight Bearing Restrictions: No General: PT Amount of Missed Time (min): 14 Minutes PT Missed Treatment Reason: Patient fatigue    Therapy/Group:  Individual Therapy  Juluis Rainier 03/13/2021, 9:49 AM

## 2021-03-13 NOTE — Progress Notes (Signed)
Occupational Therapy Session Note  Patient Details  Name: Crystal Park MRN: [id number] Date of Birth: Feb 12, 1961  Today's Date: 03/13/2021 OT Individual Time: 1100-1201 OT Individual Time Calculation (min): 61 min    Short Term Goals: Week 1:  OT Short Term Goal 1 (Week 1): Pt will complete toileting with min assist for both bowel and bladder self cath at [facility]. OT Short Term Goal 2 (Week 1): Pt will complete LB dressing with min assist OT Short Term Goal 3 (Week 1): Pt will complete UB/LB bathing using AE as needed with CGA. OT Short Term Goal 4 (Week 1): Pt will complete BSC transfer with CGA.   Skilled Therapeutic Interventions/Progress Updates:    Pt greeted at time of session semireclined in bed resting with family present, husband and daughter who remained throughout session. Pt performing bed mobility Supervision to sit EOB and stand pivot bed > wheelchair CGA with RW. Transferred to commode same manner and Min A for toileting tasks. Note pt toileting x2 occurrence during session, urgency noted and pt with incontinent episodes x2. Pt voicing concerns about cathing difficulties and recommended speak with MD for specifics. Pt set up at sink for sink level hair washing, OT assisted with washing and the pt drying off with towel and brushing. Transported to gym on [facility] and stand pivot wheelchair <> Nustep with CGA and no AD. Set up on level 3 for 6 minutes for BUE/BLE exercise and cardio endurance. Stand pivot wheelchair > commode > bed with CGA. Alarm on call bell in reach and nursing present in room.   Therapy Documentation Precautions:  Precautions Precautions: Fall Restrictions Weight Bearing Restrictions: No    Therapy/Group: Individual Therapy  Erasmo Score 03/13/2021, 7:19 AM

## 2021-03-13 NOTE — Progress Notes (Signed)
Physical Therapy Session Note  Patient Details  Name: Crystal Park MRN: [id number] Date of Birth: 1961/02/14  Today's Date: 03/13/2021 PT Individual Time: 1300-1415 PT Individual Time Calculation (min): 75 min   Short Term Goals: Week 1:  PT Short Term Goal 1 (Week 1): Pt will perform sit <>stand transfers w/CGA and LRAD consistently PT Short Term Goal 2 (Week 1): Pt will ambulate 25' w/LRAD and CGA PT Short Term Goal 3 (Week 1): Pt will intiate stair training w/min A to prepare for DC home  Skilled Therapeutic Interventions/Progress Updates:    Pt received supine in bed, agreeable to participate in therapy session. No complaints of pain. Bed mobility Supervision with use of bedrail with increased time. Sit to stand and stand pivot transfer with RW and close Supervision to CGA during session. Assisted pt with donning tennis shoes and L foot up brace. Ambulation 2 x 20 ft, 2 x 30 ft with RW and CGA before onset of fatigue. Per pt report she ambulated about 20 ft at the most at home prior to admission. Standing alt L/R 3" step-ups with 2 handrails and CGA for balance with focus on LE strengthening, 2 x 10 reps with seated rest break in between. Pt reports onset of dizziness/lightheadedness following 2nd set of step-ups, seated BP 114/73 and symptoms improve with seated rest break. Obtained 16x16 w/c for improved fit for patient. Manual w/c propulsion up to 100 ft with use of BUE at Supervision level before onset of fatigue. Pt requests to return to bed at end of session, Supervision for bed mobility. Pt left seated in bed with needs in reach, bed alarm in place, family present.  Therapy Documentation Precautions:  Precautions Precautions: Fall Restrictions Weight Bearing Restrictions: No      Therapy/Group: Individual Therapy   Peter Congo, PT, DPT, CSRS  03/13/2021, 5:00 PM

## 2021-03-13 NOTE — Progress Notes (Signed)
Physical Therapy Note  Patient Details  Name: Crystal Park MRN: [id number] Date of Birth: 07-15-1960 Today's Date: 03/13/2021    Pt's plan of care adjusted to 15/7 after speaking with care team and discussed with MD after eval as pt currently unable to tolerate current therapy schedule with OT, PT, and SLP.     Juluis Rainier 03/13/2021, 4:07 PM

## 2021-03-13 NOTE — Progress Notes (Signed)
PROGRESS NOTE   Subjective/Complaints:  Pt frustrated about urinary incontinence. Only bm on 1/7.   ROS: Patient denies fever, rash, sore throat, blurred vision, nausea, vomiting, diarrhea, cough, shortness of breath or chest pain, joint or back pain, headache, or mood change.    Objective:   No results found. Recent Labs    03/13/21 0523  WBC 11.7*  HGB 12.9  HCT 38.0  PLT 348   Recent Labs    03/13/21 0753  NA 136  K 3.9  CL 99  CO2 25  GLUCOSE 115*  BUN 14  CREATININE 0.51  CALCIUM 9.5    Intake/Output Summary (Last 24 hours) at 03/13/2021 1030 Last data filed at 03/13/2021 0740 Gross per 24 hour  Intake 600 ml  Output 250 ml  Net 350 ml        Physical Exam: Vital Signs Blood pressure 109/74, pulse 89, temperature 98 F (36.7 C), resp. rate 16, height 5\' 6"  (1.676 m), weight 56.4 kg, SpO2 95 %.     Constitutional: No distress . Vital signs reviewed. HEENT: NCAT, EOMI, oral membranes moist Neck: supple Cardiovascular: RRR without murmur. No JVD    Respiratory/Chest: CTA Bilaterally without wheezes or rales. Normal effort    GI/Abdomen: BS +, non-tender, non-distended Ext: no clubbing, cyanosis, or edema Psych: pleasant and cooperative  Musculoskeletal:- hyperextension of thumbs B/L  Neurological:     Comments: Flat affect with soft voice. Nystagmus right lateral vision present. Right sided weakness with tight heel cord. 2+DTRs. RUE 3/5, LUE 4/5, RLE 3/5 HF and otherwise 4/5, LLE 4/5. Sensation intact.  No Hoffman's or clonus B/L. No changes in tone Skin: dry, warm   Assessment/Plan: 1. Functional deficits which require 3+ hours per day of interdisciplinary therapy in a comprehensive inpatient rehab setting. Physiatrist is providing close team supervision and 24 hour management of active medical problems listed below. Physiatrist and rehab team continue to assess barriers to discharge/monitor  patient progress toward functional and medical goals  Care Tool:  Bathing              Bathing assist Assist Level: Moderate Assistance - Patient 50 - 74%     Upper Body Dressing/Undressing Upper body dressing   What is the patient wearing?: Hospital gown only    Upper body assist Assist Level: Supervision/Verbal cueing    Lower Body Dressing/Undressing Lower body dressing      What is the patient wearing?: Incontinence brief     Lower body assist Assist for lower body dressing: Maximal Assistance - Patient 25 - 49%     Toileting Toileting    Toileting assist Assist for toileting: Dependent - Patient 0%     Transfers Chair/bed transfer  Transfers assist     Chair/bed transfer assist level: Minimal Assistance - Patient > 75%     Locomotion Ambulation   Ambulation assist      Assist level: Minimal Assistance - Patient > 75% Assistive device: Walker-rolling Max distance: 10'   Walk 10 feet activity   Assist     Assist level: Minimal Assistance - Patient > 75% Assistive device: Walker-rolling   Walk 50 feet activity   Assist Walk  50 feet with 2 turns activity did not occur: Safety/medical concerns (Fatigue, MS exacerbation)         Walk 150 feet activity   Assist Walk 150 feet activity did not occur: Safety/medical concerns (Fatigue, MS exacerbation)         Walk 10 feet on uneven surface  activity   Assist Walk 10 feet on uneven surfaces activity did not occur: Safety/medical concerns (Fatigue, MS exacerbation)         Wheelchair     Assist Is the patient using a wheelchair?: Yes Type of Wheelchair: Manual    Wheelchair assist level: Dependent - Patient 0%      Wheelchair 50 feet with 2 turns activity    Assist        Assist Level: Dependent - Patient 0%   Wheelchair 150 feet activity     Assist      Assist Level: Dependent - Patient 0%   Blood pressure 109/74, pulse 89, temperature 98 F  (36.7 C), resp. rate 16, height 5\' 6"  (1.676 m), weight 56.4 kg, SpO2 95 %.  Medical Problem List and Plan: 1. Functional deficits secondary to MS/AKI             -patient may shower             -ELOS/Goals: 10-14 days modI  -Continue CIR therapies including PT, OT, and SLP  2.  Impaired mobility: Continue             -antiplatelet therapy: N/A.  3. Headaches: Resume Excedrin prn for HA.  --oxycodone prn. 1/9- doesn't c/o pain- con't regimen  4. Mood: LCSW to follow for evaluation and support.              -antipsychotic agents: N/A 5. Neuropsych: This patient is capable of making decisions on her own behalf. 6. Skin/Wound Care: Routine pressure relief measures.  7. Fluids/Electrolytes/Nutrition: Monitor I/O. Intake poor and variable at baseline per family. 8. E coli UTI: keflex completed             --pyridium added to help with urethritis.  1/6- dysuria pain better/not resolved 1/9 still having a lot of incontinence  -recheck urine culture 9. Neurogenic bladder: Will schedule every 4 hours caths as at home to prevent spasms/incontinence 1/5- will increase Oxybutynin to 50 mg TID since daily too low of a dose- will reassess if needs to switch to Myrbetriq in next few days.  1/6- Bladder leakage MUCH better in between caths- con't q4 hours for now.    1/9 still some incontinence before/between caths -will dc ditropan and try myrbetriq 10. Hypokalemia: Question etiology--has been supplemented for the past 2 days.             --recheck level in am.  1/5- K+ 4.2- -->3.9 1/9. Encourage K+ in diet 11. Leucocytosis: Likely due to UTI--recheck CBC in am.   1/5- WBC 11.8- -->11.7 1/9 12. Chronic fatigue: Ampyra ineffective and unable to tolerate stimulants due to SE.  13. Tachycardia: TSH normal : start propanolol 10mg  HS  1/5- HR 90s-low 100s- will monitor with addition of Propranolol.   1/6- HR mid 100s this AM- will increase Propanolol to 20 mg QHS  1/9 HR stable 14.  Constipation  1/6- LBM ~ 1 week ago per pt- will give Sorbitol x1 after therapy today.    1/9- had bm with sorbitol 1/7.  may need another dose   -add senna-s in PM  LOS: 5 days A FACE  TO FACE EVALUATION WAS PERFORMED  Meredith Staggers 03/13/2021, 10:30 AM

## 2021-03-14 LAB — URINE CULTURE: Culture: NO GROWTH

## 2021-03-14 MED ORDER — SORBITOL 70 % SOLN
45.0000 mL | Freq: Once | Status: AC
  Administered 2021-03-14: 45 mL via ORAL
  Filled 2021-03-14: qty 60

## 2021-03-14 MED ORDER — SORBITOL 70 % SOLN: 45.0000 mL | Freq: Every day | Status: DC | PRN

## 2021-03-14 MED ORDER — SENNOSIDES-DOCUSATE SODIUM 8.6-50 MG PO TABS
2.0000 | ORAL_TABLET | Freq: Two times a day (BID) | ORAL | Status: DC
  Administered 2021-03-15 – 2021-03-22 (×14): 2 via ORAL
  Filled 2021-03-14 (×16): qty 2

## 2021-03-14 NOTE — Progress Notes (Signed)
PROGRESS NOTE   Subjective/Complaints: CC; Little constipated. Will try to increase fluid intake.  .   ROS: Patient denies fever, rash, sore throat, blurred vision, nausea, vomiting, diarrhea, cough, shortness of breath or chest pain, joint or back pain, headache, or mood change.    Objective:   No results found. Recent Labs    03/13/21 0523  WBC 11.7*  HGB 12.9  HCT 38.0  PLT 348   Recent Labs    03/13/21 0753  NA 136  K 3.9  CL 99  CO2 25  GLUCOSE 115*  BUN 14  CREATININE 0.51  CALCIUM 9.5    Intake/Output Summary (Last 24 hours) at 03/14/2021 1430 Last data filed at 03/14/2021 1315 Gross per 24 hour  Intake 300 ml  Output 400 ml  Net -100 ml        Physical Exam: Vital Signs Blood pressure 110/67, pulse 79, temperature 98.7 F (37.1 C), resp. rate 20, height 5\' 6"  (1.676 m), weight 56.4 kg, SpO2 97 %.     Constitutional: No distress . Vital signs reviewed. HEENT: NCAT, EOMI, oral membranes moist Neck: supple Cardiovascular: RRR without murmur. No JVD    Respiratory/Chest: clear bilaterally. No increase in WOB.     GI/Abdomen: BS +, non-tender, non-distended Ext: no clubbing, cyanosis, or edema Psych: pleasant and cooperative  Musculoskeletal:- hyperextension of thumbs B/L  Neurological:     Comments: Flat affect with soft voice. Nystagmus right lateral vision present. Right sided weakness with tight heel cord. 2+DTRs. RUE 3/5, LUE 4/5, RLE 3/5 HF and otherwise 4/5, LLE 4/5. Sensation intact.  No Hoffman's or clonus B/L. No changes in tone Skin: dry, warm   Assessment/Plan: 1. Functional deficits which require 3+ hours per day of interdisciplinary therapy in a comprehensive inpatient rehab setting. Physiatrist is providing close team supervision and 24 hour management of active medical problems listed below. Physiatrist and rehab team continue to assess barriers to discharge/monitor patient  progress toward functional and medical goals  Care Tool:  Bathing              Bathing assist Assist Level: Moderate Assistance - Patient 50 - 74%     Upper Body Dressing/Undressing Upper body dressing   What is the patient wearing?: Hospital gown only    Upper body assist Assist Level: Supervision/Verbal cueing    Lower Body Dressing/Undressing Lower body dressing      What is the patient wearing?: Incontinence brief     Lower body assist Assist for lower body dressing: Maximal Assistance - Patient 25 - 49%     Toileting Toileting    Toileting assist Assist for toileting: Minimal Assistance - Patient > 75%     Transfers Chair/bed transfer  Transfers assist     Chair/bed transfer assist level: Contact Guard/Touching assist     Locomotion Ambulation   Ambulation assist      Assist level: Contact Guard/Touching assist Assistive device: Walker-rolling Max distance: 30'   Walk 10 feet activity   Assist     Assist level: Contact Guard/Touching assist Assistive device: Walker-rolling   Walk 50 feet activity   Assist Walk 50 feet with 2 turns activity did  not occur: Safety/medical concerns (Fatigue, MS exacerbation)         Walk 150 feet activity   Assist Walk 150 feet activity did not occur: Safety/medical concerns (Fatigue, MS exacerbation)         Walk 10 feet on uneven surface  activity   Assist Walk 10 feet on uneven surfaces activity did not occur: Safety/medical concerns (Fatigue, MS exacerbation)         Wheelchair     Assist Is the patient using a wheelchair?: Yes Type of Wheelchair: Manual    Wheelchair assist level: Supervision/Verbal cueing Max wheelchair distance: 100'    Wheelchair 50 feet with 2 turns activity    Assist        Assist Level: Supervision/Verbal cueing   Wheelchair 150 feet activity     Assist      Assist Level: Dependent - Patient 0%   Blood pressure 110/67, pulse  79, temperature 98.7 F (37.1 C), resp. rate 20, height 5\' 6"  (1.676 m), weight 56.4 kg, SpO2 97 %.  Medical Problem List and Plan: 1. Functional deficits secondary to MS/AKI             -patient may shower             -ELOS/Goals: 10-14 days modI  -Continue CIR therapies including PT, OT, and SLP  2.  Impaired mobility: Continue             -antiplatelet therapy: N/A.  3. Headaches: Resume Excedrin prn for HA.  --oxycodone prn. 1/9- doesn't c/o pain- con't regimen  4. Mood: LCSW to follow for evaluation and support.              -antipsychotic agents: N/A 5. Neuropsych: This patient is capable of making decisions on her own behalf. 6. Skin/Wound Care: Routine pressure relief measures.  7. Fluids/Electrolytes/Nutrition: Monitor I/O. Intake poor and variable at baseline per family. 8. E coli UTI: keflex completed             --pyridium added to help with urethritis.  1/6- dysuria pain better/not resolved 1/9 still having a lot of incontinence -Recheck urine culture is negative. Completed Keflex 01/09. 9. Neurogenic bladder: Will schedule every 4 hours caths as at home to prevent spasms/incontinence 1/5- will increase Oxybutynin to 50 mg TID since daily too low of a dose- will reassess if needs to switch to Myrbetriq in next few days.  1/6- Bladder leakage MUCH better in between caths- con't q4 hours for now.   1/9 still some incontinence before/between caths -will dc ditropan and try myrbetriq  1/10- continues to be incontinent despite every 4 hours cath with low volumes. Will spread out to every 4-6 hours. Encourage patient to increase fluid intake.  10. Hypokalemia: Question etiology--has been supplemented for the past 2 days.             --recheck level in am.  1/5- K+ 4.2- -->3.9 1/9. Encourage K+ in diet 11. Leucocytosis: Likely due to UTI--recheck CBC in am.   1/5- WBC 11.8- -->11.7 1/9 12. Chronic fatigue: Ampyra ineffective and unable to tolerate stimulants due to SE.  13.  Tachycardia: TSH normal : start propanolol 10mg  HS  1/5- HR 90s-low 100s- will monitor with addition of Propranolol.   1/6- HR mid 100s this AM- will increase Propanolol to 20 mg QHS  1/9 HR stable 14. Constipation: continues to be an issues. Will increase Senna S to bid w/myrbetriq on board..  --sorbitol tonight.   LOS:  6 days A FACE TO FACE EVALUATION WAS PERFORMED  Jacquelynn Cree 03/14/2021, 2:30 PM

## 2021-03-14 NOTE — Progress Notes (Signed)
Spoke to Crystal Chew NP who stated ok to straight cath patient every 4-6 hours. Patient requests to be straight cath after lunch and after dinner. Denies pain or discomfort at this time.

## 2021-03-14 NOTE — Progress Notes (Signed)
Physical Therapy Session Note  Patient Details  Name: Crystal Park MRN: [id number] Date of Birth: October 14, 1960  Today's Date: 03/14/2021 PT Individual Time: 0915-1012 PT Individual Time Calculation (min): 57 min   Short Term Goals: Week 1:  PT Short Term Goal 1 (Week 1): Pt will perform sit <>stand transfers w/CGA and LRAD consistently PT Short Term Goal 2 (Week 1): Pt will ambulate 25' w/LRAD and CGA PT Short Term Goal 3 (Week 1): Pt will intiate stair training w/min A to prepare for DC home  Skilled Therapeutic Interventions/Progress Updates:    pt received in bed and agreeable to therapy. Pt requesting brief change d/t incontinence of urine. Dependent brief chantge with min A to roll L, CGA to roll R. Sup<>sit with supervision and Stand pivot transfer with no AD with CGA to w/c. Pt then donned shoes with tot for time and completed morning hygiene tasks at w/c level with distant supervision. Pt transported to therapy gym for time management and energy conservation. Extended rest breaks throughout to allow for recovery and pacing. Pt performed the following exercises to promote LE strength and endurance: LAQ 3x 10, standing marches, bicep curl seated march combination. Pt reported incr concentration to sequence last activity. Pt ambulated x 25 ft back to room with CGA and RW. Pt returned to bed with supervision and was left with all needs in reach and alarm active.    Therapy Documentation Precautions:  Precautions Precautions: Fall Restrictions Weight Bearing Restrictions: No General:   Vital Signs:   Pain:   Mobility:   Locomotion :    Trunk/Postural Assessment :    Balance:   Exercises:   Other Treatments:      Therapy/Group: Individual Therapy  Juluis Rainier 03/14/2021, 9:43 AM

## 2021-03-14 NOTE — Progress Notes (Signed)
Patient ID: Crystal Park, female   DOB: May 03, 1960, 61 y.o.   MRN: [id number]  Met with pt and husband who is present in her room to update them regarding team conference goals of mod/I-supervision and target discharge date of 1/19. Both aware still working on self caths and making sure her UTI is gone since treated. Both want her to go back to [facility] and will fax referral today so no lag time between discharge and starting OP therapies. Both pleased with her progress and agreeable to discharge date.

## 2021-03-14 NOTE — Discharge Instructions (Addendum)
° °  Inpatient Rehab Discharge Instructions  Crystal Park Discharge date and time: No discharge date for patient encounter.   Activities/Precautions/ Functional Status: Activity: activity as tolerated Diet: regular diet Wound Care: Routine skin checks Functional status:  ___ No restrictions     ___ Walk up steps independently ___ 24/7 supervision/assistance   ___ Walk up steps with assistance ___ Intermittent supervision/assistance  ___ Bathe/dress independently ___ Walk with walker     __x_ Bathe/dress with assistance ___ Walk Independently    ___ Shower independently ___ Walk with assistance    ___ Shower with assistance ___ No alcohol     ___ Return to work/school ________  Special Instructions:  No driving smoking or alcohol  COMMUNITY REFERRALS UPON DISCHARGE:    Outpatient: PT & OT             Agency: [facility] [address] [place] [address] Phone: [phone]             Appointment Date/Time: WILL CONTACT HUSBAND TO SET UP APPOINTMENT  Medical Equipment/Items Ordered:HAS ALL NEEDED EQUIPMENT FROM PAST ADMISSIONS                                                 Agency/Supplier:NA   My questions have been answered and I understand these instructions. I will adhere to these goals and the provided educational materials after my discharge from the hospital.  Patient/Caregiver Signature _______________________________ Date __________  Clinician Signature _______________________________________ Date __________  Please bring this form and your medication list with you to all your follow-up doctor's appointments.

## 2021-03-14 NOTE — Patient Care Conference (Signed)
Inpatient RehabilitationTeam Conference and Plan of Care Update Date: 03/14/2021   Time: 11:45 AM    Patient Name: Crystal Park      Medical Record Number: [id number]  Date of Birth: [birth year] Sex: Female         Room/Bed: 5C18C/5C18C-01 Payor Info: Payor: CIG[employer] / Plan: CIGNA MANAGED / Product Type: *No Product type* /    Admit Date/Time:  03/08/2021  3:17 PM  Primary Diagnosis:  Multiple sclerosis [facility])  Hospital Problems: Principal Problem:   Multiple sclerosis [facility])    Expected Discharge Date: Expected Discharge Date: 03/23/21  Team Members Present: Physician leading conference: Dr. Alger Simons Social Worker Present: Ovidio Kin, LCSW Nurse Present: Dorthula Nettles, RN PT Present: Ailene Rud, PT OT Present: Lillia Corporal, OT PPS Coordinator present : Gunnar Fusi, SLP     Current Status/Progress Goal Weekly Team Focus  Bowel/Bladder   Pt is continent of bowel and incontinent of bladder  Pt will gain continence of bladder  Will assess qshift and PRN   Swallow/Nutrition/ Hydration             ADL's             Mobility   grossly min A, CGA at times  mod I transfers, supervision gait  endurance, OOB tolerance, gait, BLE strength   Communication             Safety/Cognition/ Behavioral Observations            Pain   Pt is currently pain free  Pt will remain pain free  Will assess qshift and PRN   Skin   Pt's skin is intact  Pt's skin will remain intact  Will assess qhsift and PRN     Discharge Planning:  Home with husband who works form home so is there. Was going to OP neuro prior to admission will resume this at DC   Team Discussion: UTI being treated. Constipation related to MS. Has urinary overflow. Lives with husband who works from home. Reports 6/10 back pain. Flat affect, very down/depressed. Min assist overall, will need assist with socks. Low endurance and activity tolerance. Working on [employer], good carry over. Patient on  target to meet rehab goals: yes, supervision to mod I goals.  *See Care Plan and progress notes for long and short-term goals.   Revisions to Treatment Plan:  Adjusting medications.  Teaching Needs: Family education, medication/pain management, bowel/bladder management, safety awareness, transfer/gait training, etc.  Current Barriers to Discharge: Decreased caregiver support, Incontinence, Neurogenic bowel and bladder, Lack of/limited family support, and Medication compliance  Possible Resolutions to Barriers: Family education Follow-up outpatient PT/OT      Medical Summary Current Status: MS with associated weakness and gait disorder, neurogenic bladder and bowel. Having problems with urinary incontinence, constipation  Barriers to Discharge: Medical stability;Neurogenic Bowel & Bladder   Possible Resolutions to Barriers/Weekly Focus: adjusting bladder regimen. daily assessment of labs and patient data.   Continued Need for Acute Rehabilitation Level of Care: The patient requires daily medical management by a physician with specialized training in physical medicine and rehabilitation for the following reasons: Direction of a multidisciplinary physical rehabilitation program to maximize functional independence : Yes Medical management of patient stability for increased activity during participation in an intensive rehabilitation regime.: Yes Analysis of laboratory values and/or radiology reports with any subsequent need for medication adjustment and/or medical intervention. : Yes   I attest that I was present, lead the team conference, and concur with  the assessment and plan of the team.   Cristi Loron 03/14/2021, 4:49 PM

## 2021-03-14 NOTE — Progress Notes (Signed)
Occupational Therapy Session Note  Patient Details  Name: Crystal Park MRN: [id number] Date of Birth: 1960/12/01  Today's Date: 03/14/2021 OT Individual Time: 1330-1440 OT Individual Time Calculation (min): 70 min    Short Term Goals: Week 1:  OT Short Term Goal 1 (Week 1): Pt will complete toileting with min assist for both bowel and bladder self cath at [facility]. OT Short Term Goal 2 (Week 1): Pt will complete LB dressing with min assist OT Short Term Goal 3 (Week 1): Pt will complete UB/LB bathing using AE as needed with CGA. OT Short Term Goal 4 (Week 1): Pt will complete BSC transfer with CGA.   Skilled Therapeutic Interventions/Progress Updates:    Pt greeted at time of session bed level resting, husband Mardelle Matte present who remained throughout session. Pt wanting to shower this session and OT retrieved items. Pt performing bed mobility Supervision to sit EOB and c/o dizziness that took 5+ mins to improve sitting at EOB, BP checked and 115/79 which pt and spouse state is her normal. Deferred bathing/ADL until tomorrow 2/2 dizziness and not wanting to drop BP in shower. Stand pivot bed > wheelchair CGA with RW and transferred in this manner throughout session. Transported to 4th floor ADL apartment and demonstrated use of other shower chairs for the pt in case her built in does not work at home. Requested photos of bathroom set up to further assess and plan for shower at time of DC. Pt performed 2 rounds of 1kg ball toss seated in main gym, 1x10 standard throws and 1x5 throw + chest press, pt reporting very fatigued at this time. Transported to [employer] for change of scenery and more sunlight, also to increase OOB tolerance. Returned to room and performed stand pivot wheelchair <> shower bench CGA to improve comfort and decrease anxiety with taking shower. Returned to bed same manner, all transfers with RW. Alarm on call bell in reach.    Therapy Documentation Precautions:   Precautions Precautions: Fall Restrictions Weight Bearing Restrictions: No     Therapy/Group: Individual Therapy  Erasmo Score 03/14/2021, 1:19 PM

## 2021-03-15 ENCOUNTER — Ambulatory Visit: Payer: Managed Care, Other (non HMO) | Admitting: Occupational Therapy

## 2021-03-15 ENCOUNTER — Ambulatory Visit: Payer: Managed Care, Other (non HMO)

## 2021-03-15 MED ORDER — MIRABEGRON ER 50 MG PO TB24
50.0000 mg | ORAL_TABLET | Freq: Every day | ORAL | Status: DC
  Administered 2021-03-16 – 2021-03-23 (×8): 50 mg via ORAL
  Filled 2021-03-15 (×8): qty 1

## 2021-03-15 MED ORDER — MIRABEGRON ER 25 MG PO TB24
25.0000 mg | ORAL_TABLET | Freq: Once | ORAL | Status: DC
  Filled 2021-03-15: qty 1

## 2021-03-15 NOTE — Progress Notes (Signed)
Occupational Therapy Session Note  Patient Details  Name: Crystal Park MRN: [id number] Date of Birth: 1960/08/15  Today's Date: 03/15/2021 OT Individual Time: 1100-1116 OT Individual Time Calculation (min): 16 min  and Today's Date: 03/15/2021 OT Missed Time: 44 Minutes Missed Time Reason: Patient fatigue;Patient ill (comment) (nauseous)   Short Term Goals: Week 1:  OT Short Term Goal 1 (Week 1): Pt will complete toileting with min assist for both bowel and bladder self cath at [facility]. OT Short Term Goal 2 (Week 1): Pt will complete LB dressing with min assist OT Short Term Goal 3 (Week 1): Pt will complete UB/LB bathing using AE as needed with CGA. OT Short Term Goal 4 (Week 1): Pt will complete BSC transfer with CGA.   Skilled Therapeutic Interventions/Progress Updates:    Pt greeted at time of session up in wheelchair with husband Mardelle Matte present, pt stating she feels "terrible" and has been sitting up in wheelchair since last PT session. Pt feeling nauseous but unable to determine cause of nausea. Offered ADL routine, shower level bathing, out of room activity, change of scenery, etc. But pt feeling too poor to participate. Pt only wanting to return to bed, stand pivot wheelchair > bed CGA. Offered BLE ROM for stretching routine, but politely declined. Alarm on call bell in reach. Reached out to RN regarding nausea. Missed 44 mins of OT.  Therapy Documentation Precautions:  Precautions Precautions: Fall Restrictions Weight Bearing Restrictions: No     Therapy/Group: Individual Therapy  Erasmo Score 03/15/2021, 7:23 AM

## 2021-03-15 NOTE — Progress Notes (Signed)
Physical Therapy Session Note  Patient Details  Name: Crystal Park MRN: [id number] Date of Birth: 02-11-1961  Today's Date: 03/15/2021 PT Individual Time: 0917-1000, 3335-4562 PT Individual Time Calculation (min): 43 min, 40 min  Short Term Goals: Week 1:  PT Short Term Goal 1 (Week 1): Pt will perform sit <>stand transfers w/CGA and LRAD consistently PT Short Term Goal 2 (Week 1): Pt will ambulate 25' w/LRAD and CGA PT Short Term Goal 3 (Week 1): Pt will intiate stair training w/min A to prepare for DC home  Skilled Therapeutic Interventions/Progress Updates:    pt received in bed and agreeable to therapy. No complaint of pain. Supine>sit with supervision and bed rail. Stand pivot transfer to w/c with CGA. Pt completed morning hygiene tasks with distant supervision. Pt then transported to [employer] bridge for therapeutic affect of sunlight. Pt ambulated x 80 ft and x 100 ft with RW and CGA. Low foot clearance BIL, but able to clear safely. Therapeutic rest breaks spent discussing pt stated goals and d/c plan. Further rest break spent emphasizing importance of OOB tolerance and remaining upright to prevent lightheadedness as occurred yesterday. Pt agreeable to remaining in w/c until OT session. Pt set uo in w/c and remained with her husband present.    Session 2: Pt received in bed, having had bout of nausea earlier in the afternoon. Pt opts for bed level exercise as she is just starting to feel better. Tapping facilitation used when appropriate for improved muscle activation. Pt performed the following exercises to promote LE strength and endurance:  -heel slides, 3 x 10 LLE, 3x5 RLE with assist to control motion d/t hip and knee weakness, able to control first reps of last sets with CGA, but needed assist with fatigue -SLR with 3 x 5 bil -bench press with 3# weighted dowel 3 x 8 -supine rows with YTB, x 20 -supine bridges 3 x 6 -lower trunk rotations for improved hip and core  control, 3 x 5 Pt reported appropriate levels of fatigue and exertion throughout session, no exacerbation of nausea symptoms. Pt remained in bed after session and was left with all needs in reach and alarm active.    Therapy Documentation Precautions:  Precautions Precautions: Fall Restrictions Weight Bearing Restrictions: No     Therapy/Group: Individual Therapy  Juluis Rainier 03/15/2021, 11:50 AM

## 2021-03-15 NOTE — Progress Notes (Signed)
PROGRESS NOTE   Subjective/Complaints: Pt slept fairly well. Feels that bladder is less active with myrbetriq. Flow sheet indicates ongoing incontinence. Did move her bowels.   ROS: Patient denies fever, rash, sore throat, blurred vision, nausea, vomiting, diarrhea, cough, shortness of breath or chest pain, joint or back pain, headache, or mood change.    Objective:   No results found. Recent Labs    03/13/21 0523  WBC 11.7*  HGB 12.9  HCT 38.0  PLT 348   Recent Labs    03/13/21 0753  NA 136  K 3.9  CL 99  CO2 25  GLUCOSE 115*  BUN 14  CREATININE 0.51  CALCIUM 9.5    Intake/Output Summary (Last 24 hours) at 03/15/2021 0842 Last data filed at 03/15/2021 0815 Gross per 24 hour  Intake 240 ml  Output 100 ml  Net 140 ml        Physical Exam: Vital Signs Blood pressure 113/66, pulse 83, temperature 97.6 F (36.4 C), temperature source Oral, resp. rate 16, height 5\' 6"  (1.676 m), weight 56.4 kg, SpO2 97 %.     Constitutional: No distress . Vital signs reviewed. HEENT: NCAT, EOMI, oral membranes moist Neck: supple Cardiovascular: RRR without murmur. No JVD    Respiratory/Chest: CTA Bilaterally without wheezes or rales. Normal effort    GI/Abdomen: BS +, non-tender, non-distended Ext: no clubbing, cyanosis, or edema Psych: pleasant and cooperative  Musculoskeletal:- hyperextension of thumbs B/L  Neurological:     Comments: Flat affect with soft voice. Nystagmus right lateral vision present. Right sided weakness with tight heel cord. 2+DTRs. RUE 3/5, LUE 4/5, RLE 3/5 HF and otherwise 4/5, LLE 4/5. --motor stable. Sensation intact.  No Hoffman's or clonus B/L--stable Skin: dry, warm   Assessment/Plan: 1. Functional deficits which require 3+ hours per day of interdisciplinary therapy in a comprehensive inpatient rehab setting. Physiatrist is providing close team supervision and 24 hour management of active  medical problems listed below. Physiatrist and rehab team continue to assess barriers to discharge/monitor patient progress toward functional and medical goals  Care Tool:  Bathing              Bathing assist Assist Level: Moderate Assistance - Patient 50 - 74%     Upper Body Dressing/Undressing Upper body dressing   What is the patient wearing?: Hospital gown only    Upper body assist Assist Level: Supervision/Verbal cueing    Lower Body Dressing/Undressing Lower body dressing      What is the patient wearing?: Incontinence brief     Lower body assist Assist for lower body dressing: Maximal Assistance - Patient 25 - 49%     Toileting Toileting    Toileting assist Assist for toileting: Minimal Assistance - Patient > 75%     Transfers Chair/bed transfer  Transfers assist     Chair/bed transfer assist level: Contact Guard/Touching assist     Locomotion Ambulation   Ambulation assist      Assist level: Contact Guard/Touching assist Assistive device: Walker-rolling Max distance: 30'   Walk 10 feet activity   Assist     Assist level: Contact Guard/Touching assist Assistive device: Walker-rolling   Walk 50 feet  activity   Assist Walk 50 feet with 2 turns activity did not occur: Safety/medical concerns (Fatigue, MS exacerbation)         Walk 150 feet activity   Assist Walk 150 feet activity did not occur: Safety/medical concerns (Fatigue, MS exacerbation)         Walk 10 feet on uneven surface  activity   Assist Walk 10 feet on uneven surfaces activity did not occur: Safety/medical concerns (Fatigue, MS exacerbation)         Wheelchair     Assist Is the patient using a wheelchair?: Yes Type of Wheelchair: Manual    Wheelchair assist level: Supervision/Verbal cueing Max wheelchair distance: 100'    Wheelchair 50 feet with 2 turns activity    Assist        Assist Level: Supervision/Verbal cueing    Wheelchair 150 feet activity     Assist      Assist Level: Dependent - Patient 0%   Blood pressure 113/66, pulse 83, temperature 97.6 F (36.4 C), temperature source Oral, resp. rate 16, height 5\' 6"  (1.676 m), weight 56.4 kg, SpO2 97 %.  Medical Problem List and Plan: 1. Functional deficits secondary to MS/AKI             -patient may shower             -ELOS/Goals: 1/19,  mod I  -Continue CIR therapies including PT, OT, and SLP  2.  Impaired mobility: Continue             -antiplatelet therapy: N/A.  3. Headaches: Resume Excedrin prn for HA.  --oxycodone prn. 1/9- doesn't c/o pain- con't regimen  4. Mood: LCSW to follow for evaluation and support.              -antipsychotic agents: N/A 5. Neuropsych: This patient is capable of making decisions on her own behalf. 6. Skin/Wound Care: Routine pressure relief measures.  7. Fluids/Electrolytes/Nutrition: Monitor I/O. Intake poor and variable at baseline per family. 8. E coli UTI: keflex completed             --pyridium added to help with urethritis.  1/6- dysuria pain better/not resolved 1/9 still having a lot of incontinence -Recheck urine culture is negative. Completed Keflex 01/09. 9. Neurogenic bladder: Will schedule every 4 hours caths as at home to prevent spasms/incontinence    1/11- changed caths to every 4-6 hours. - Encourage patient to increase fluid intake.  -increase myrbetriq to 50mg  q am 10. Hypokalemia: Question etiology--has been supplemented for the past 2 days.             --recheck level in am.  1/5- K+ 4.2- -->3.9 1/9. Encourage K+ in diet 11. Leucocytosis: Likely due to UTI--recheck CBC in am.   1/5- WBC 11.8- -->11.7 1/9 12. Chronic fatigue: Ampyra ineffective and unable to tolerate stimulants due to SE.  13. Tachycardia: TSH normal : start propanolol 10mg  HS  1/11 HR controlled 14. Constipation: continues to be an issues. Will increase Senna S to bid w/myrbetriq on board..  --sorbitol with  results   LOS: 7 days A FACE TO FACE EVALUATION WAS PERFORMED  Meredith Staggers 03/15/2021, 8:42 AM

## 2021-03-16 LAB — GLUCOSE, CAPILLARY: Glucose-Capillary: 108 mg/dL — ABNORMAL HIGH (ref 70–99)

## 2021-03-16 NOTE — Progress Notes (Signed)
Occupational Therapy Weekly Progress Note ° °Patient Details  °Name: Crystal Park °MRN: [id number] °Date of Birth: 08/04/1960 ° °Beginning of progress report period: March 09, 2021 °End of progress report period: March 16, 2021 ° ° ° °Patient has met 3 of 4 short term goals.  Pt is progressing toward OT goals performing functional mobility and ADL transfers with Supervision - CGA with RW, shower level bathing CGA, Min/CGA for LB dressing with hemitechnique, and is greatly limited by chronic fatigue and endurance 2/2 MS exacerbation. Husband Crystal Park has been present for several sessions with ongoing family ed. Focusing on ADL retraining and energy conservation techniques.  ° °Patient continues to demonstrate the following deficits: muscle weakness, decreased cardiorespiratoy endurance, impaired timing and sequencing, abnormal tone, decreased coordination, and decreased motor planning, decreased motor planning, and decreased sitting balance, decreased standing balance, decreased postural control, and decreased balance strategies and therefore will continue to benefit from skilled OT intervention to enhance overall performance with BADL, iADL, and Reduce care partner burden. ° °Patient progressing toward long term goals..  Continue plan of care. ° °OT Short Term Goals °Week 1:  OT Short Term Goal 1 (Week 1): Pt will complete toileting with min assist for both bowel and bladder self cath at [facility]. °OT Short Term Goal 1 - Progress (Week 1): Progressing toward goal °OT Short Term Goal 2 (Week 1): Pt will complete LB dressing with min assist °OT Short Term Goal 2 - Progress (Week 1): Met °OT Short Term Goal 3 (Week 1): Pt will complete UB/LB bathing using AE as needed with CGA. °OT Short Term Goal 3 - Progress (Week 1): Met °OT Short Term Goal 4 (Week 1): Pt will complete BSC transfer with CGA. °OT Short Term Goal 4 - Progress (Week 1): Met °Week 2:  OT Short Term Goal 1 (Week 2): STG = LTG 2/2 ELOS ° ° ° °Therapy  Documentation °Precautions:  °Precautions °Precautions: Fall °Restrictions °Weight Bearing Restrictions: No ° ° ° ° °Therapy/Group: Individual Therapy ° °Hannah C Spach °03/16/2021, 11:23 AM  °

## 2021-03-16 NOTE — Progress Notes (Signed)
Physical Therapy Weekly Progress Note  Patient Details  Name: Crystal Park MRN: [id number] Date of Birth: 1961-01-01  Beginning of progress report period: March 09, 2021 End of progress report period: March 16, 2021  Today's Date: 03/16/2021 PT Individual Time: 6950-7225 PT Individual Time Calculation (min): 69 min   Patient has met 3 of 3 short term goals.  Pt demonstrates appropriate progression toward goals. Gait up to 100 ft with RW with CGA. Stair navigation x 4 with BUE support and CGA. Pt requires frequent rest breaks for fatigue.   Patient continues to demonstrate the following deficits muscle weakness and decreased standing balance and decreased balance strategies and therefore will continue to benefit from skilled PT intervention to increase functional independence with mobility.  Patient progressing toward long term goals..  Continue plan of care.  PT Short Term Goals Week 1:  PT Short Term Goal 1 (Week 1): Pt will perform sit <>stand transfers w/CGA and LRAD consistently PT Short Term Goal 1 - Progress (Week 1): Met PT Short Term Goal 2 (Week 1): Pt will ambulate 72' w/LRAD and CGA PT Short Term Goal 2 - Progress (Week 1): Met PT Short Term Goal 3 (Week 1): Pt will intiate stair training w/min A to prepare for DC home PT Short Term Goal 3 - Progress (Week 1): Met Week 2:  PT Short Term Goal 1 (Week 2): =LTGs d/t ELOS  Skilled Therapeutic Interventions/Progress Updates:    pt received in bed and agreeable to therapy. No complaint of pain. Supine>sit with supervision, no bed rails, HOB elevated. Stand pivot transfer to w/c with no AD, close supervision. Pt transported to therapy gym for time management and energy conservation. Pt ambulated short distances (5-10 ft) throughout session with RW and CGA. Sit to stand with CGA fading to supervision to RW. Car transfer with supervision, rest break before getting out of car simulator. Pt then ascended/descended ramp with CGA,  reported descending feels unsteady but no LOB noted. Transitioned to stair navigation with BUE support, 4 x 6". Pt self selected step to pattern with LLE leading. Step ups 2 x 2 BIL for strength and improved independence with step at home, BUE support and CGA. Pt extremely fatigued after 2 reps BIL, and could not lift RLE onto step for 3rd rep. Extended rest breaks after activity d/t fatigue and lightheadedness. Pt requesting to return to room for toileting. ambulatory transfer to commode with CGA and RW, CGA for clothing management and tot A for posterior hygiene for energy conservation. Noted bladder incontinence, tot A to change brief for energy conservation. Continent bowel void. Pt ambulated to TIS w/c and remained at end of session with family present, planning to stay up for ~30 min. Pt was left with all needs in reach and alarm active.   Therapy Documentation Precautions:  Precautions Precautions: Fall Restrictions Weight Bearing Restrictions: No    Therapy/Group: Individual Therapy  Mickel Fuchs 03/16/2021, 2:40 PM

## 2021-03-16 NOTE — Progress Notes (Signed)
Occupational Therapy Session Note  Patient Details  Name: Crystal Park MRN: [id number] Date of Birth: Sep 02, 1960  Today's Date: 03/17/2021 OT Individual Time: 5638-7564 OT Individual Time Calculation (min): 59 min   Skilled Therapeutic Interventions/Progress Updates:    Pt greeted in bed with no c/o pain, reporting that her bladder felt "full." Agreeable to start session with toileting. Supervision for supine<sit with HOB elevated. CGA for ambulatory transfer to the toilet using RW. CGA for toileting tasks with cuing in regards to cleanliness during hygiene completion. Pt did not have continent void but did exhibit B+B incontinence in brief. Discussed with spouse + daughter importance of timed toileting to make sure pt wasn't in a soiled brief for long durations of time for skin integrity/UTI prevention. Per spouse, pt with I+O caths at home but had no incontinence. Pt then ambulated to the sink to wash hands, completed hand washing and oral care in sitting to conserve energy. After, caregiver education was completed in regards to toileting and toilet transfers. Spouse Mardelle Matte had hands on practice ambulating with pt to the restroom (using gait belt + RW). He assisted her with toileting tasks as pt reported having the urge to have a BM. Pt again with no continent void but did have gas, spouse assisted with hygiene thoroughness. Pt then ambulated back to the bed and returned to bed. Left her with all needs within reach and bed alarm set. Notified nursing team of spouse's trained caregiver status and pts request to have a bladder scan performed. Tx focus placed on ADL retraining, functional transfers, standing endurance, standing balance, and caregiver education.   Therapy Documentation Precautions:  Precautions Precautions: Fall Restrictions Weight Bearing Restrictions: No ADL: ADL Eating: Set up (sandwich; anticipate using utensils may require min assist) Where Assessed-Eating: Bed  level Where Assessed-Grooming:  (pt politely refused) Upper Body Bathing:  (pt politely refused) Lower Body Bathing:  (pt politely refused) Upper Body Dressing: Supervision/safety, Setup Where Assessed-Upper Body Dressing: Edge of bed Lower Body Dressing: Maximal assistance, Minimal cueing Where Assessed-Lower Body Dressing: Edge of bed Toileting: Dependent (in/out cath due to fatigue; completed with assist of nursing at bed level; pt reports she usually does cath sitting on toilet)  Therapy/Group: Individual Therapy  Clarita Mcelvain A Gerrett Loman 03/17/2021, 12:44 PM

## 2021-03-16 NOTE — Progress Notes (Signed)
Occupational Therapy Session Note  Patient Details  Name: Crystal Park MRN: [id number] Date of Birth: Oct 26, 1960  Today's Date: 03/16/2021 OT Individual Time: 3300-7622 OT Individual Time Calculation (min): 45 min  and Today's Date: 03/16/2021 OT Missed Time: 15 Minutes Missed Time Reason: Patient fatigue   Short Term Goals: Week 1:  OT Short Term Goal 1 (Week 1): Pt will complete toileting with min assist for both bowel and bladder self cath at [facility]. OT Short Term Goal 2 (Week 1): Pt will complete LB dressing with min assist OT Short Term Goal 3 (Week 1): Pt will complete UB/LB bathing using AE as needed with CGA. OT Short Term Goal 4 (Week 1): Pt will complete BSC transfer with CGA.   Skilled Therapeutic Interventions/Progress Updates:    Pt greeted at time of session semireclined in bed resting, agreeable to OT session despite fatigue. Pt agreeable to try to perform shower level bathing today. Supine > sit Supervision and ambulated to bathroom CGA/Supervision with RW. No increase in dizziness throughout session, pt at baseline level. Transferred to shower bench same manner as above and doffed clothing seated and standing. OT assisting with footwear throughout session for time and energy conservation. Pt performed shower level bathing with Set up/Supervision seated and only CGA when standing to wash buttocks. Utilized LHS to wash back and feet seated. Dried off bench level before walking short distance to commode to simulate home environment. UB dress set up and LB dress CGA/Min for standing portion, cues to thread RLE first 2/2 weakness. Ambulated back to bed and performed seated oral hygiene and grooming tasks after shower. Offered several times for pt to sit up in TIS chair reclined for comfort and to improve OOB tolerance but pt politely but adamantly declining at this time. Sit > supine and alarm on call bell in reach. Missed 15 mins 2/2 fatigue.  Therapy Documentation Precautions:   Precautions Precautions: Fall Restrictions Weight Bearing Restrictions: No     Therapy/Group: Individual Therapy  Erasmo Score 03/16/2021, 7:13 AM

## 2021-03-16 NOTE — Progress Notes (Signed)
PROGRESS NOTE   Subjective/Complaints: Still being cathed every 6 hours. Patient doesn't feel she needs it that often. Has still been incontinent in general despite q4 schedule. Myrbetriq seems to help with urgency  ROS: Patient denies fever, rash, sore throat, blurred vision, nausea, vomiting, diarrhea, cough, shortness of breath or chest pain, joint or back pain, headache, or mood change.    Objective:   No results found. No results for input(s): WBC, HGB, HCT, PLT in the last 72 hours.  No results for input(s): NA, K, CL, CO2, GLUCOSE, BUN, CREATININE, CALCIUM in the last 72 hours.   Intake/Output Summary (Last 24 hours) at 03/16/2021 1129 Last data filed at 03/16/2021 0732 Gross per 24 hour  Intake 340 ml  Output 274 ml  Net 66 ml        Physical Exam: Vital Signs Blood pressure 101/70, pulse 80, temperature 98.4 F (36.9 C), temperature source Oral, resp. rate 16, height 5\' 6"  (1.676 m), weight 56.2 kg, SpO2 96 %.     Constitutional: No distress . Vital signs reviewed. HEENT: NCAT, EOMI, oral membranes moist Neck: supple Cardiovascular: RRR without murmur. No JVD    Respiratory/Chest: CTA Bilaterally without wheezes or rales. Normal effort    GI/Abdomen: BS +, non-tender, non-distended Ext: no clubbing, cyanosis, or edema Psych: pleasant and cooperative  Musculoskeletal:- hyperextension of thumbs B/L  Neurological:     Comments: Flat affect with soft voice. Nystagmus right lateral vision present. Right sided weakness with tight heel cord. 2+DTRs. RUE 3/5, LUE 4/5, RLE 3/5 HF and otherwise 4/5, LLE 4/5. --motor exam stable. Sensation intact.  No increase in tone.  Skin: dry, warm   Assessment/Plan: 1. Functional deficits which require 3+ hours per day of interdisciplinary therapy in a comprehensive inpatient rehab setting. Physiatrist is providing close team supervision and 24 hour management of active  medical problems listed below. Physiatrist and rehab team continue to assess barriers to discharge/monitor patient progress toward functional and medical goals  Care Tool:  Bathing    Body parts bathed by patient: Right arm, Left arm, Chest, Front perineal area, Abdomen, Buttocks, Right upper leg, Left upper leg, Face, Left lower leg, Right lower leg         Bathing assist Assist Level: Contact Guard/Touching assist     Upper Body Dressing/Undressing Upper body dressing   What is the patient wearing?: Pull over shirt    Upper body assist Assist Level: Set up assist    Lower Body Dressing/Undressing Lower body dressing      What is the patient wearing?: Incontinence brief, Pants     Lower body assist Assist for lower body dressing: Minimal Assistance - Patient > 75%     Toileting Toileting    Toileting assist Assist for toileting: Minimal Assistance - Patient > 75%     Transfers Chair/bed transfer  Transfers assist     Chair/bed transfer assist level: Contact Guard/Touching assist     Locomotion Ambulation   Ambulation assist      Assist level: Contact Guard/Touching assist Assistive device: Walker-rolling Max distance: 30'   Walk 10 feet activity   Assist     Assist level: Contact Guard/Touching  assist Assistive device: Walker-rolling   Walk 50 feet activity   Assist Walk 50 feet with 2 turns activity did not occur: Safety/medical concerns (Fatigue, MS exacerbation)         Walk 150 feet activity   Assist Walk 150 feet activity did not occur: Safety/medical concerns (Fatigue, MS exacerbation)         Walk 10 feet on uneven surface  activity   Assist Walk 10 feet on uneven surfaces activity did not occur: Safety/medical concerns (Fatigue, MS exacerbation)         Wheelchair     Assist Is the patient using a wheelchair?: Yes Type of Wheelchair: Manual    Wheelchair assist level: Supervision/Verbal cueing Max  wheelchair distance: 100'    Wheelchair 50 feet with 2 turns activity    Assist        Assist Level: Supervision/Verbal cueing   Wheelchair 150 feet activity     Assist      Assist Level: Dependent - Patient 0%   Blood pressure 101/70, pulse 80, temperature 98.4 F (36.9 C), temperature source Oral, resp. rate 16, height 5\' 6"  (1.676 m), weight 56.2 kg, SpO2 96 %.  Medical Problem List and Plan: 1. Functional deficits secondary to MS/AKI             -patient may shower             -ELOS/Goals: 1/19,  mod I  -Continue CIR therapies including PT, OT, and SLP    2.  Impaired mobility: Continue             -antiplatelet therapy: N/A.  3. Headaches: Resume Excedrin prn for HA.  --oxycodone prn. 1/12- pain seems controlled  4. Mood: LCSW to follow for evaluation and support.              -antipsychotic agents: N/A 5. Neuropsych: This patient is capable of making decisions on her own behalf. 6. Skin/Wound Care: Routine pressure relief measures.  7. Fluids/Electrolytes/Nutrition: Monitor I/O. Intake poor and variable at baseline per family. 8. E coli UTI: keflex completed             -pyridium added to help with urethritis.  1/6- dysuria pain better/not resolved 1/9 still having a lot of incontinence -Recheck urine culture is negative. Completed Keflex 01/09. 9. Neurogenic bladder: Will schedule every 4 hours caths as at home to prevent spasms/incontinence    1/12- changed caths to every  6 hours. - Encourage patient to increase fluid intake.  -increased myrbetriq to 50mg  q am 1/11 -monitor for pattern, likely will have ongoing incontinence 10. Hypokalemia: Question etiology--has been supplemented for the past 2 days.             --recheck level in am.  1/5- K+ 4.2- -->3.9 1/9. Encourage K+ in diet--recheck labs monday 11. Leucocytosis: Likely due to UTI--recheck CBC in am.   1/5- WBC 11.8- -->11.7 1/9 12. Chronic fatigue: Ampyra ineffective and unable to tolerate  stimulants due to SE. Overnight voiding/caths not helping either 13. Tachycardia: TSH normal : start propanolol 10mg  HS  1/12 HR controlled 14. Constipation: continues to be an issues. Increased Senna S to bid w/myrbetriq on board..  --sorbitol with results 1/10  LOS: 8 days A FACE TO FACE EVALUATION WAS PERFORMED  3/9 03/16/2021, 11:29 AM

## 2021-03-17 NOTE — Progress Notes (Signed)
Occupational Therapy Session Note  Patient Details  Name: Crystal Park MRN: [id number] Date of Birth: 08-18-1960  Today's Date: 03/18/2021 OT Individual Time: [id number] OT Individual Time Calculation (min): 73 min    Skilled Therapeutic Interventions/Progress Updates:    Pt greeted in bed, asleep, easily woken. She was agreeable to tx, declining shower, wanting to start session by using the restroom, pt feeling the urge to void bladder. Supine<sit completed with setup assistance, HOB elevated pt using the bedrail. CGA-close supervision for ambulatory transfer to the toilet using RW. Pt able to complete 3/3 components of toileting with CGA and vcs, pt with small bladder + BM voids. She then ambulated to the w/c parked in front of the sink. Pt given opportunity to stand to complete hand washing and oral care but she opted to complete these tasks sitting down to conserve energy. Pt asked to have her hair washed. OT retrieved hair washing tray and other necessary items and assisted pt with hair washing. Worked on UB strength/endurance while combing and blow-drying hair afterwards. Pt doffed socks using figure 4 position and donned new ones given setup assist. Invited pt to ambulate in the hallway for a bit however pt reported feeling too fatigued to do so. Agreeable to self propel the w/c to continue working on UB strength/endurance. Pt self propelled the w/c ~100 ft in [facility] hallway and then back to room. Needed Min A to return to room due to fatigue, she needed 2 rest breaks to make the 100 ft due to limited endurance. CGA for stand pivot<bed using RW. Pt remained in bed at close of session, all needs within reach and bed alarm set. Family just arrived to visit. Tx focus placed on functional transfers, ADL retraining, activity tolerance, and UB strengthening to maximize functional independence with self care tasks and transfers.   Therapy Documentation Precautions:  Precautions Precautions:  Fall Restrictions Weight Bearing Restrictions: No Vital Signs:   Pain: no c/o pain during tx Pain Assessment Pain Scale: 0-10 Pain Score: 0-No pain ADL: ADL Eating: Set up (sandwich; anticipate using utensils may require min assist) Where Assessed-Eating: Bed level Where Assessed-Grooming:  (pt politely refused) Upper Body Bathing:  (pt politely refused) Lower Body Bathing:  (pt politely refused) Upper Body Dressing: Supervision/safety, Setup Where Assessed-Upper Body Dressing: Edge of bed Lower Body Dressing: Maximal assistance, Minimal cueing Where Assessed-Lower Body Dressing: Edge of bed Toileting: Dependent (in/out cath due to fatigue; completed with assist of nursing at bed level; pt reports she usually does cath sitting on toilet)  Therapy/Group: Individual Therapy  [name] A Talyah Seder 03/18/2021, 12:56 PM

## 2021-03-17 NOTE — Progress Notes (Signed)
Physical Therapy Session Note ° °Patient Details  °Name: Crystal Park °MRN: [id number] °Date of Birth: 12/23/1960 ° °Today's Date: 03/17/2021 °PT Individual Time: 1129-1156, 1335-1430 °PT Individual Time Calculation (min): 27 min, 35 min  ° °Short Term Goals: °Week 1:  PT Short Term Goal 1 (Week 1): Pt will perform sit <>stand transfers w/CGA and LRAD consistently °PT Short Term Goal 1 - Progress (Week 1): Met °PT Short Term Goal 2 (Week 1): Pt will ambulate 25' w/LRAD and CGA °PT Short Term Goal 2 - Progress (Week 1): Met °PT Short Term Goal 3 (Week 1): Pt will intiate stair training w/min A to prepare for DC home °PT Short Term Goal 3 - Progress (Week 1): Met °Week 2:  PT Short Term Goal 1 (Week 2): =LTGs d/t ELOS ° °Skilled Therapeutic Interventions/Progress Updates:  °  Session 1: °pt received in bed and agreeable to therapy. No complaint of pain. Bed mobility with supervision. Stand pivot transfer to w/c with supervision. Pt transported to north tower hall for gait training. Pt ambulated x~100 ft and ~120 ft with RW and supervision. Narrow BOS and ankle inversion BIL noted. VC for wider step width and incr step height. Pt requested to toilet. Transported back to room for time. ambulatory transfer to bathroom, CGA for clothing management, independent hygiene. Pt then returned to bed and was left with all needs in reach and alarm active.  ° °Session 2: °pt received in bed and agreeable to therapy. No complaint of pain. Pt requesting to toilet at start of session. Bed mobility with supervision, ambulatory transfers EOB>toilet>sink>w/c with RW and supervision, supervision clothing management distant supervision for hygiene. Pink washed hands at w/c level at sink. Pt then transported to therapy gym for energy conservation. Directed in standing step taps on 3" step with BUE support on RW. Progressed to taps with alternating UE reaches. Pt then directed in standing without UE support (CGA with no LOB) and moving  horseshoes from one side to the other and from table to RW bar. Pt then retrieved cones x 4 from 3 " step with CGA and no UE support, no LOB. Pt did not turn head to R side to retrieve or place objects. Pt states turning her head triggers a feeling of unsteadiness. Pt directed in slow Head turns for improved stability with head motion. Pt then reported urge to urinate. Transported back to room and toileted in the same manner as above, noted incontinence. Pt returned to TIS chair and was left with all needs in reach and alarm active.  ° °Therapy Documentation °Precautions:  °Precautions °Precautions: Fall °Restrictions °Weight Bearing Restrictions: No °General: °  ° ° ° ° °Therapy/Group: Individual Therapy ° °Olivia C Brittain °03/17/2021, 12:19 PM  °

## 2021-03-18 NOTE — Progress Notes (Signed)
Occupational Therapy Session Note  Patient Details  Name: Crystal Park MRN: [id number] Date of Birth: September 22, 1960  Today's Date: 03/19/2021 OT Individual Time: 8786-7672 OT Individual Time Calculation (min): 71 min   Skilled Therapeutic Interventions/Progress Updates:    Pt greeted in the bed, pain reportedly manageable for tx. She declined shower but was agreeable to engage in sponge bathing at the sink today. Close supervision for ambulatory transfer to the w/c parked in front of her sink using RW. Pt then engaged in UB/LB ADLs at sit<stand with supervision for balance, min cues for short term memory recall and sequencing. She required several seated rest breaks during dynamic activity due to limited activity tolerance. Pt able to utilize figure 4 position to meet task demands of washing feet and donning gripper socks. Oral care/grooming tasks completed while seated at the sink. She had urgency for bladder void, close supervision for ambulatory transfer to the toilet using RW however pt unable to void. She then returned to the w/c. OT printed a handout regarding energy conservation/work simplification strategies to implement during ADL/IADL activity at home. Reviewed handout with pt. She returned to bed at close of session, all needs within reach and bed alarm set. Tx focus placed on functional transfers, ADL retraining, pt education, and balance.   Therapy Documentation Precautions:  Precautions Precautions: Fall Restrictions Weight Bearing Restrictions: No ADL: ADL Eating: Set up (sandwich; anticipate using utensils may require min assist) Where Assessed-Eating: Bed level Where Assessed-Grooming:  (pt politely refused) Upper Body Bathing:  (pt politely refused) Lower Body Bathing:  (pt politely refused) Upper Body Dressing: Supervision/safety, Setup Where Assessed-Upper Body Dressing: Edge of bed Lower Body Dressing: Maximal assistance, Minimal cueing Where Assessed-Lower Body  Dressing: Edge of bed Toileting: Dependent (in/out cath due to fatigue; completed with assist of nursing at bed level; pt reports she usually does cath sitting on toilet) Therapy/Group: Individual Therapy  Federick Levene A Bailey Kolbe 03/19/2021, 12:19 PM

## 2021-03-18 NOTE — Progress Notes (Signed)
Physical Therapy Session Note  Patient Details  Name: KIAMBER GRETZ MRN: [id number] Date of Birth: [birth year]  Today's Date: 03/18/2021 PT Individual Time: 1400-1500 PT Individual Time Calculation (min): 60 min   Short Term Goals: Week 2:  PT Short Term Goal 1 (Week 2): =LTGs d/t ELOS  Skilled Therapeutic Interventions/Progress Updates: Pt presents reclined in TIS w/c and agreeable to therapy.  Pt wheeled to main gym for energy conservation.  Pt amb multiple trials during session of 100' each including turns to return to w/c.  Pt amb w/ decreased BOS but also IR B feet.  Pt performed stepping up to (1) 5" step w/ RW and CGA.  Pt requires extended seated rest break 2/2 fatigue.  Pt performed standing toe-taps to 5" step 3 x 10, clothes pins using alternating Bilateral hands.  Pt returned to hallway outside of room and amb x 25' into room and to bed.  Pt transferred sit to supine w/ supervision.  Bed alarm on and all needs in reach.     Therapy Documentation Precautions:  Precautions Precautions: Fall Restrictions Weight Bearing Restrictions: No General:   Vital Signs: Therapy Vitals Temp: 98.1 F (36.7 C) Pulse Rate: 89 Resp: 14 BP: 112/68 Patient Position (if appropriate): Lying Oxygen Therapy SpO2: 100 % O2 Device: Room Air Pain: no c/o       Therapy/Group: Individual Therapy  Ladoris Gene 03/18/2021, 3:33 PM

## 2021-03-19 NOTE — Progress Notes (Signed)
Physical Therapy Session Note  Patient Details  Name: Crystal Park MRN: [id number] Date of Birth: 03/04/1961  Today's Date: 03/19/2021 PT Individual Time: 4098-1191 PT Individual Time Calculation (min): 47 min  and Today's Date: 03/19/2021 PT Missed Time: 14 Minutes Missed Time Reason: Toileting  Short Term Goals: Week 1:  PT Short Term Goal 1 (Week 1): Pt will perform sit <>stand transfers w/CGA and LRAD consistently PT Short Term Goal 1 - Progress (Week 1): Met PT Short Term Goal 2 (Week 1): Pt will ambulate 89' w/LRAD and CGA PT Short Term Goal 2 - Progress (Week 1): Met PT Short Term Goal 3 (Week 1): Pt will intiate stair training w/min A to prepare for DC home PT Short Term Goal 3 - Progress (Week 1): Met Week 2:  PT Short Term Goal 1 (Week 2): =LTGs d/t ELOS  Skilled Therapeutic Interventions/Progress Updates:    Pt toileting with assist from husband [name] on arrival. Pt and Braham completed toileting without assist. Pt missed 16 min for toileting. Pt then transported to therapy gym in [place] for time and energy conservation. Pt ambulated short distance with CGA and RW throughout session. Step ups on 6 in step with BUE support 3 x 4 for strength and endurance. Pt then ambulated x 100 ft with CGA and RW for endurance. Pt transported back to room. Then discussed walking program for d/c wit hpt and family. Provided education on importance of building strength and endurance to have more reserve when illness occurs. Pt returned to bed with supervision d/t reporting feelings of nausea. Pt agreeable to get out of bed with family after resting. Pt then remained in bed and was left with all needs in reach and alarm active.   Therapy Documentation Precautions:  Precautions Precautions: Fall Restrictions Weight Bearing Restrictions: No General:      Therapy/Group: Individual Therapy  Mickel Fuchs 03/19/2021, 2:00 PM

## 2021-03-19 NOTE — Progress Notes (Signed)
PROGRESS NOTE   Subjective/Complaints: States she is no longer cathed , last recorded straight cath is 1/12  ROS: Patient denies CP, SOB, N/V/D   Objective:   No results found. No results for input(s): WBC, HGB, HCT, PLT in the last 72 hours.  No results for input(s): NA, K, CL, CO2, GLUCOSE, BUN, CREATININE, CALCIUM in the last 72 hours.   Intake/Output Summary (Last 24 hours) at 03/19/2021 0935 Last data filed at 03/19/2021 0700 Gross per 24 hour  Intake 478 ml  Output --  Net 478 ml         Physical Exam: Vital Signs Blood pressure 108/69, pulse 81, temperature 99.1 F (37.3 C), resp. rate 18, height 5\' 6"  (1.676 m), weight 56.2 kg, SpO2 100 %.   General: No acute distress Mood and affect are appropriate Heart: Regular rate and rhythm no rubs murmurs or extra sounds Lungs: Clear to auscultation, breathing unlabored, no rales or wheezes Abdomen: Positive bowel sounds, soft nontender to palpation, nondistended Extremities: No clubbing, cyanosis, or edema Skin: No evidence of breakdown, no evidence of rash   Musculoskeletal:- hyperextension of thumbs B/L  Neurological:     Comments: Flat affect with soft voice. Nystagmus right lateral vision present. Right sided weakness with tight heel cord. 2+DTRs. RUE 3/5, LUE 4/5, RLE 3/5 HF and otherwise 4/5, LLE 4/5. --motor exam stable. Sensation intact.  No increase in tone.  Skin: dry, warm   Assessment/Plan: 1. Functional deficits which require 3+ hours per day of interdisciplinary therapy in a comprehensive inpatient rehab setting. Physiatrist is providing close team supervision and 24 hour management of active medical problems listed below. Physiatrist and rehab team continue to assess barriers to discharge/monitor patient progress toward functional and medical goals  Care Tool:  Bathing    Body parts bathed by patient: Right arm, Left arm, Chest, Front  perineal area, Abdomen, Buttocks, Right upper leg, Left upper leg, Face, Left lower leg, Right lower leg         Bathing assist Assist Level: Contact Guard/Touching assist     Upper Body Dressing/Undressing Upper body dressing   What is the patient wearing?: Pull over shirt    Upper body assist Assist Level: Set up assist    Lower Body Dressing/Undressing Lower body dressing      What is the patient wearing?: Incontinence brief, Pants     Lower body assist Assist for lower body dressing: Minimal Assistance - Patient > 75%     Toileting Toileting    Toileting assist Assist for toileting: Contact Guard/Touching assist     Transfers Chair/bed transfer  Transfers assist     Chair/bed transfer assist level: Supervision/Verbal cueing     Locomotion Ambulation   Ambulation assist      Assist level: Supervision/Verbal cueing Assistive device: Walker-rolling Max distance: 100   Walk 10 feet activity   Assist     Assist level: Supervision/Verbal cueing Assistive device: Walker-rolling   Walk 50 feet activity   Assist Walk 50 feet with 2 turns activity did not occur: Safety/medical concerns (Fatigue, MS exacerbation)  Assist level: Supervision/Verbal cueing Assistive device: Walker-rolling    Walk 150 feet activity  Assist Walk 150 feet activity did not occur: Safety/medical concerns         Walk 10 feet on uneven surface  activity   Assist Walk 10 feet on uneven surfaces activity did not occur: Safety/medical concerns (Fatigue, MS exacerbation)         Wheelchair     Assist Is the patient using a wheelchair?: Yes Type of Wheelchair: Manual    Wheelchair assist level: Supervision/Verbal cueing Max wheelchair distance: 100'    Wheelchair 50 feet with 2 turns activity    Assist        Assist Level: Supervision/Verbal cueing   Wheelchair 150 feet activity     Assist      Assist Level: Dependent - Patient 0%    Blood pressure 108/69, pulse 81, temperature 99.1 F (37.3 C), resp. rate 18, height 5\' 6"  (1.676 m), weight 56.2 kg, SpO2 100 %.  Medical Problem List and Plan: 1. Functional deficits secondary to MS/AKI             -patient may shower             -ELOS/Goals: 1/19,  mod I  -Continue CIR therapies including PT, OT, and SLP    2.  Impaired mobility: Continue             -antiplatelet therapy: N/A.  3. Headaches: Resume Excedrin prn for HA.  --oxycodone prn. 1/12- pain seems controlled  4. Mood: LCSW to follow for evaluation and support.              -antipsychotic agents: N/A 5. Neuropsych: This patient is capable of making decisions on her own behalf. 6. Skin/Wound Care: Routine pressure relief measures.  7. Fluids/Electrolytes/Nutrition: Monitor I/O. Intake poor and variable at baseline per family. 8. E coli UTI: keflex completed        Resolved  9. Neurogenic bladder: emptying well low PVRs<295ml - Encourage patient to increase fluid intake.  -increased myrbetriq to 50mg  q am 1/11 -monitor for pattern, likely will have ongoing incontinence 10. Hypokalemia: Question etiology--has been supplemented for the past 2 days.             --recheck level in am.  1/5- K+ 4.2- -->3.9 1/9. Encourage K+ in diet--recheck labs monday 11. Leucocytosis: Likely due to UTI--recheck CBC in am.   1/5- WBC 11.8- -->11.7 1/9 12. Chronic fatigue: Ampyra ineffective and unable to tolerate stimulants due to SE. Overnight voiding/caths not helping either 13. Tachycardia: TSH normal : start propanolol 10mg  HS Vitals:   03/18/21 1915 03/19/21 0430  BP: 101/64 108/69  Pulse: 99 81  Resp:  18  Temp: 97.9 F (36.6 C) 99.1 F (37.3 C)  SpO2: 100% 100%  Rate is wnl this am   14. Constipation: continues to be an issues. Increased Senna S to bid w/myrbetriq on board..  --sorbitol with results 1/10  LOS: 11 days A FACE TO FACE EVALUATION WAS PERFORMED  03/19/2021, 9:35 AM

## 2021-03-20 ENCOUNTER — Ambulatory Visit: Payer: Managed Care, Other (non HMO) | Admitting: Physical Therapy

## 2021-03-20 LAB — BASIC METABOLIC PANEL
Anion gap: 6 (ref 5–15)
BUN: 19 mg/dL (ref 6–20)
CO2: 25 mmol/L (ref 22–32)
Calcium: 8.9 mg/dL (ref 8.9–10.3)
Chloride: 107 mmol/L (ref 98–111)
Creatinine, Ser: 0.65 mg/dL (ref 0.44–1.00)
GFR, Estimated: >60 mL/min (ref >60)
Glucose, Bld: 102 mg/dL — ABNORMAL HIGH (ref 70–99)
Potassium: 3.7 mmol/L (ref 3.5–5.1)
Sodium: 138 mmol/L (ref 135–145)

## 2021-03-20 LAB — CBC
HCT: 36.2 % (ref 36.0–46.0)
Hemoglobin: 12.1 g/dL (ref 12.0–15.0)
MCH: 32.4 pg (ref 26.0–34.0)
MCHC: 33.4 g/dL (ref 30.0–36.0)
MCV: 97.1 fL (ref 80.0–100.0)
Platelets: 481 10*3/uL — ABNORMAL HIGH (ref 150–400)
RBC: 3.73 MIL/uL — ABNORMAL LOW (ref 3.87–5.11)
RDW: 13.3 % (ref 11.5–15.5)
WBC: 9.8 10*3/uL (ref 4.0–10.5)
nRBC: 0 % (ref 0.0–0.2)

## 2021-03-20 NOTE — Progress Notes (Signed)
PROGRESS NOTE   Subjective/Complaints:  Pt reports went for days after received something for constipation.  LBM yesterday.  Bladder leaking still- not getting cathed anymore. Myrbetriq increased 1/12.      ROS:  Pt denies SOB, abd pain, CP, N/V/C/D, and vision changes  Objective:   No results found. Recent Labs    03/20/21 0501  WBC 9.8  HGB 12.1  HCT 36.2  PLT 481*    Recent Labs    03/20/21 0501  NA 138  K 3.7  CL 107  CO2 25  GLUCOSE 102*  BUN 19  CREATININE 0.65  CALCIUM 8.9     Intake/Output Summary (Last 24 hours) at 03/20/2021 1742 Last data filed at 03/20/2021 1318 Gross per 24 hour  Intake 478 ml  Output --  Net 478 ml        Physical Exam: Vital Signs Blood pressure 104/69, pulse 89, temperature (!) 97.5 F (36.4 C), temperature source Oral, resp. rate 15, height 5\' 6"  (1.676 m), weight 56.2 kg, SpO2 99 %.    General: awake, alert, appropriate, laying supine in bed; appears fatigued early in AM; NAD HENT: conjugate gaze; oropharynx moist CV: regular rate; no JVD Pulmonary: CTA B/L; no W/R/R- good air movement GI: soft, NT, ND, (+)BS; hypoactive Psychiatric: appropriate; low interaction/quiet. Neurological: alert; fatigued  Musculoskeletal:- hyperextension of thumbs B/L  Neurological:     Comments: Flat affect with soft voice. Nystagmus right lateral vision present. Right sided weakness with tight heel cord. 2+DTRs. RUE 3/5, LUE 4/5, RLE 3/5 HF and otherwise 4/5, LLE 4/5. --motor exam stable. Sensation intact.  No increase in tone.  Skin: dry, warm   Assessment/Plan: 1. Functional deficits which require 3+ hours per day of interdisciplinary therapy in a comprehensive inpatient rehab setting. Physiatrist is providing close team supervision and 24 hour management of active medical problems listed below. Physiatrist and rehab team continue to assess barriers to discharge/monitor  patient progress toward functional and medical goals  Care Tool:  Bathing    Body parts bathed by patient: Right arm, Left arm, Chest, Front perineal area, Abdomen, Buttocks, Right upper leg, Left upper leg, Face, Left lower leg, Right lower leg         Bathing assist Assist Level: Supervision/Verbal cueing     Upper Body Dressing/Undressing Upper body dressing   What is the patient wearing?: Pull over shirt    Upper body assist Assist Level: Set up assist    Lower Body Dressing/Undressing Lower body dressing      What is the patient wearing?: Incontinence brief, Pants     Lower body assist Assist for lower body dressing: Minimal Assistance - Patient > 75%     Toileting Toileting    Toileting assist Assist for toileting: Supervision/Verbal cueing     Transfers Chair/bed transfer  Transfers assist     Chair/bed transfer assist level: Supervision/Verbal cueing     Locomotion Ambulation   Ambulation assist      Assist level: Supervision/Verbal cueing Assistive device: Walker-rolling Max distance: 100   Walk 10 feet activity   Assist     Assist level: Supervision/Verbal cueing Assistive device: Walker-rolling   Walk 50  feet activity   Assist Walk 50 feet with 2 turns activity did not occur: Safety/medical concerns (Fatigue, MS exacerbation)  Assist level: Supervision/Verbal cueing Assistive device: Walker-rolling    Walk 150 feet activity   Assist Walk 150 feet activity did not occur: Safety/medical concerns         Walk 10 feet on uneven surface  activity   Assist Walk 10 feet on uneven surfaces activity did not occur: Safety/medical concerns (Fatigue, MS exacerbation)         Wheelchair     Assist Is the patient using a wheelchair?: Yes Type of Wheelchair: Manual    Wheelchair assist level: Supervision/Verbal cueing Max wheelchair distance: 100'    Wheelchair 50 feet with 2 turns activity    Assist         Assist Level: Supervision/Verbal cueing   Wheelchair 150 feet activity     Assist      Assist Level: Dependent - Patient 0%   Blood pressure 104/69, pulse 89, temperature (!) 97.5 F (36.4 C), temperature source Oral, resp. rate 15, height 5\' 6"  (1.676 m), weight 56.2 kg, SpO2 99 %.  Medical Problem List and Plan: 1. Functional deficits secondary to MS/AKI             -patient may shower             -ELOS/Goals: 1/19,  mod I  -Continue CIR- PT, OT and SLP    2.  Impaired mobility: Continue             -antiplatelet therapy: N/A.  3. Headaches: Resume Excedrin prn for HA.  --oxycodone prn. 1/12- pain seems controlled  1/16- denies pain- con't regimen d: LCSW to follow for evaluation and support.              -antipsychotic agents: N/A 5. Neuropsych: This patient is capable of making decisions on her own behalf. 6. Skin/Wound Care: Routine pressure relief measures.  7. Fluids/Electrolytes/Nutrition: Monitor I/O. Intake poor and variable at baseline per family. 8. E coli UTI: keflex completed        Resolved  9. Neurogenic bladder: emptying well low PVRs<233ml - Encourage patient to increase fluid intake.  -increased myrbetriq to 50mg  q am 1/11 -monitor for pattern, likely will have ongoing incontinence 1/16- still having bladder leakage, but no side effects from Myrbetriq which was increased 1/12 10. Hypokalemia: Question etiology--has been supplemented for the past 2 days.             --recheck level in am.  1/5- K+ 4.2- -->3.9 1/9. Encourage K+ in diet--recheck labs monday 11. Leucocytosis: Likely due to UTI--recheck CBC in am.   1/5- WBC 11.8- -->11.7 1/9 12. Chronic fatigue: Ampyra ineffective and unable to tolerate stimulants due to SE. Overnight voiding/caths not helping either 13. Tachycardia: TSH normal : start propanolol 10mg  HS Vitals:   03/20/21 0326 03/20/21 1318  BP: 110/63 104/69  Pulse: 77 89  Resp: 18 15  Temp: 98.6 F (37 C) (!) 97.5 F (36.4  C)  SpO2: 99% 99%   1/16- BP/HR controlled- con't regimen 14. Constipation: continues to be an issues. Increased Senna S to bid w/myrbetriq on board..  --sorbitol with results 1/10  1/16- said took days to keep having Bms after sorbitol- not sure if wants again.    LOS: 12 days A FACE TO FACE EVALUATION WAS PERFORMED  Dartha Rozzell 03/20/2021, 5:42 PM

## 2021-03-20 NOTE — Progress Notes (Signed)
Physical Therapy Session Note  Patient Details  Name: Crystal Park MRN: [id number] Date of Birth: [birth year]  Today's Date: 03/20/2021 PT Individual Time: 1000-1045, 3557-3220 PT Individual Time Calculation (min): 45 min, 48 min   Short Term Goals: Week 1:  PT Short Term Goal 1 (Week 1): Pt will perform sit <>stand transfers w/CGA and LRAD consistently PT Short Term Goal 1 - Progress (Week 1): Met PT Short Term Goal 2 (Week 1): Pt will ambulate 52' w/LRAD and CGA PT Short Term Goal 2 - Progress (Week 1): Met PT Short Term Goal 3 (Week 1): Pt will intiate stair training w/min A to prepare for DC home PT Short Term Goal 3 - Progress (Week 1): Met Week 2:  PT Short Term Goal 1 (Week 2): =LTGs d/t ELOS  Skilled Therapeutic Interventions/Progress Updates:    Session 1: pt received in bed and agreeable to therapy. No complaint of pain. Stand pivot transfer <>EOB with supervision, bed mobility mod I. Pt transported to therapy gym for time management and energy conservation. Pt directed in step taps for strength and endurance, 2 x 20 with BUE support. Progressed to step ups, 2 x 6. Pt then ambulated x 1:40 min and x 3:30 min for time, longest bout ~175 ft. Continued education on walking program at d/c. Pt returned to room and returned to bed. Pt was left with all needs in reach and alarm active.   Session 2: pt received in bed and agreeable to therapy. No complaint of pain. Pt requested to toilet before leaving room. ambulatory transfer to bathroom with close supervision and RW, distant supervision for 3/3 toileting tasks. Continent urine void noted. Pt stood at sink for hand and oral hygiene ~2 min. All in room gait with close supervision and RW. Pt then transported to [place] and was transported to therapy gym for time management and energy conservation.  Pt performed the following exercises with 3lb bar to promote UE strength and endurance: 2 x 12 bicep curl, circles in front of chest 2 x 10  fwd/back, OH press . Pt then ambulated 2 x 3 min in the same manner as above for endurance and LE strength. Pt returned to room and remained in w/c with all needs in reach with plan to sit up ~ 1 hour.   Therapy Documentation Precautions:  Precautions Precautions: Fall Restrictions Weight Bearing Restrictions: No    Therapy/Group: Individual Therapy  Mickel Fuchs 03/20/2021, 12:35 PM

## 2021-03-20 NOTE — Progress Notes (Signed)
Occupational Therapy Session Note  Patient Details  Name: Crystal Park MRN: [id number] Date of Birth: Dec 18, 1960  Today's Date: 03/20/2021 OT Individual Time: 1300-1358 OT Individual Time Calculation (min): 58 min    Short Term Goals: Week 1:  OT Short Term Goal 1 (Week 1): Pt will complete toileting with min assist for both bowel and bladder self cath at [facility]. OT Short Term Goal 1 - Progress (Week 1): Progressing toward goal OT Short Term Goal 2 (Week 1): Pt will complete LB dressing with min assist OT Short Term Goal 2 - Progress (Week 1): Met OT Short Term Goal 3 (Week 1): Pt will complete UB/LB bathing using AE as needed with CGA. OT Short Term Goal 3 - Progress (Week 1): Met OT Short Term Goal 4 (Week 1): Pt will complete BSC transfer with CGA. OT Short Term Goal 4 - Progress (Week 1): Met Week 2:  OT Short Term Goal 1 (Week 2): STG = LTG 2/2 ELOS   Skilled Therapeutic Interventions/Progress Updates:    Pt greeted at time of session semireclined bed level with family present who did not remain for session. No pain, but fatigued throughout session. Ambulating bed level <> bathroom with supervision and RW, toilet transfer and 3/3 tasks in same manner. Pt walking short distance to shower Supervision and transferring to shower bench same manner. Pt performing UB/LB bathing seated with set up/supervision and cues for when/where to place items. Pt using LHS for feet as well as figure four. Pt standing for brief period to wash buttocks and periarea. Dried off seated before transferring back to commode for dry surface to dry off and dress. UB dress set up, LB dress supervision for remembering hemitechnique for RLE. Ambulated back to bed, set up alarm on call bell in reach all needs met.   Therapy Documentation Precautions:  Precautions Precautions: Fall Restrictions Weight Bearing Restrictions: No    Therapy/Group: Individual Therapy  Viona Gilmore 03/20/2021, 12:54 PM

## 2021-03-21 NOTE — Progress Notes (Signed)
PROGRESS NOTE   Subjective/Complaints:  Pt reports no issues- still not needing caths- cathed self at home prior, but is emptying better here, per pt.   LBM 2 days ago, but doesn't feel constipated- doesn't want sorbito0l because went on for so long last time.    ROS:  Pt denies SOB, abd pain, CP, N/V/C/D, and vision changes   Objective:   No results found. Recent Labs    03/20/21 0501  WBC 9.8  HGB 12.1  HCT 36.2  PLT 481*    Recent Labs    03/20/21 0501  NA 138  K 3.7  CL 107  CO2 25  GLUCOSE 102*  BUN 19  CREATININE 0.65  CALCIUM 8.9     Intake/Output Summary (Last 24 hours) at 03/21/2021 0806 Last data filed at 03/20/2021 1318 Gross per 24 hour  Intake 358 ml  Output --  Net 358 ml        Physical Exam: Vital Signs Blood pressure 100/70, pulse 89, temperature (!) 97.5 F (36.4 C), resp. rate 16, height 5\' 6"  (1.676 m), weight 56.2 kg, SpO2 96 %.     General: awake, alert, appropriate, laying supine in bed; propped by pillows; NAD HENT: conjugate gaze; oropharynx moist CV: regular rate; no JVD Pulmonary: CTA B/L; no W/R/R- good air movement GI: soft, NT, ND, (+)BS Psychiatric: appropriate- very fatigued- just woke up Neurological: alert- low energy Musculoskeletal:- hyperextension of thumbs B/L  Neurological:     Comments: Flat affect with soft voice. Nystagmus right lateral vision present. Right sided weakness with tight heel cord. 2+DTRs. RUE 3/5, LUE 4/5, RLE 3/5 HF and otherwise 4/5, LLE 4/5. --motor exam stable. Sensation intact.  No increase in tone.  Skin: dry, warm   Assessment/Plan: 1. Functional deficits which require 3+ hours per day of interdisciplinary therapy in a comprehensive inpatient rehab setting. Physiatrist is providing close team supervision and 24 hour management of active medical problems listed below. Physiatrist and rehab team continue to assess barriers to  discharge/monitor patient progress toward functional and medical goals  Care Tool:  Bathing    Body parts bathed by patient: Right arm, Left arm, Chest, Front perineal area, Abdomen, Buttocks, Right upper leg, Left upper leg, Face, Left lower leg, Right lower leg         Bathing assist Assist Level: Supervision/Verbal cueing     Upper Body Dressing/Undressing Upper body dressing   What is the patient wearing?: Pull over shirt    Upper body assist Assist Level: Set up assist    Lower Body Dressing/Undressing Lower body dressing      What is the patient wearing?: Incontinence brief, Pants     Lower body assist Assist for lower body dressing: Minimal Assistance - Patient > 75%     Toileting Toileting    Toileting assist Assist for toileting: Supervision/Verbal cueing     Transfers Chair/bed transfer  Transfers assist     Chair/bed transfer assist level: Supervision/Verbal cueing     Locomotion Ambulation   Ambulation assist      Assist level: Supervision/Verbal cueing Assistive device: Walker-rolling Max distance: 100   Walk 10 feet activity   Assist  Assist level: Supervision/Verbal cueing Assistive device: Walker-rolling   Walk 50 feet activity   Assist Walk 50 feet with 2 turns activity did not occur: Safety/medical concerns (Fatigue, MS exacerbation)  Assist level: Supervision/Verbal cueing Assistive device: Walker-rolling    Walk 150 feet activity   Assist Walk 150 feet activity did not occur: Safety/medical concerns         Walk 10 feet on uneven surface  activity   Assist Walk 10 feet on uneven surfaces activity did not occur: Safety/medical concerns (Fatigue, MS exacerbation)         Wheelchair     Assist Is the patient using a wheelchair?: Yes Type of Wheelchair: Manual    Wheelchair assist level: Supervision/Verbal cueing Max wheelchair distance: 100'    Wheelchair 50 feet with 2 turns  activity    Assist        Assist Level: Supervision/Verbal cueing   Wheelchair 150 feet activity     Assist      Assist Level: Dependent - Patient 0%   Blood pressure 100/70, pulse 89, temperature (!) 97.5 F (36.4 C), resp. rate 16, height 5\' 6"  (1.676 m), weight 56.2 kg, SpO2 96 %.  Medical Problem List and Plan: 1. Functional deficits secondary to MS/AKI             -patient may shower             -ELOS/Goals: 1/19,  mod I  -con't CIR_ PT and OT- team conference today to f/u on progress   2.  Impaired mobility: Continue             -antiplatelet therapy: N/A.  3. Headaches: Resume Excedrin prn for HA.  --oxycodone prn. 1/12- pain seems controlled  1/16- denies pain- con't regimen d: LCSW to follow for evaluation and support.              -antipsychotic agents: N/A 5. Neuropsych: This patient is capable of making decisions on her own behalf. 6. Skin/Wound Care: Routine pressure relief measures.  7. Fluids/Electrolytes/Nutrition: Monitor I/O. Intake poor and variable at baseline per family. 8. E coli UTI: keflex completed        Resolved  9. Neurogenic bladder: emptying well low PVRs<225ml - Encourage patient to increase fluid intake.  -increased myrbetriq to 50mg  q am 1/11 -monitor for pattern, likely will have ongoing incontinence 1/16- still having bladder leakage, but no side effects from Myrbetriq which was increased 1/12 1/17- emptying- con't Myrbetriq- no caths required right now.  10. Hypokalemia: Question etiology--has been supplemented for the past 2 days.             --recheck level in am.  1/5- K+ 4.2- -->3.9 1/9. Encourage K+ in diet--recheck labs Monday  1/17- K+ 3.7-  11. Leucocytosis: Likely due to UTI--recheck CBC in am.   1/5- WBC 11.8- -->11.7 1/9  1/17- WBC 9.8- 12. Chronic fatigue: Ampyra ineffective and unable to tolerate stimulants due to SE. Overnight voiding/caths not helping either 13. Tachycardia: TSH normal : start propanolol 10mg   HS Vitals:   03/20/21 1938 03/21/21 0518  BP: 113/71 100/70  Pulse: 91 89  Resp: 17 16  Temp: 98.6 F (37 C) (!) 97.5 F (36.4 C)  SpO2: 99% 96%   1/16- BP/HR controlled- con't regimen 14. Constipation: continues to be an issues. Increased Senna S to bid w/myrbetriq on board..  --sorbitol with results 1/10  1/16- said took days to keep having Bms after sorbitol- not sure if wants again.  1/17- LBM 2 days ago- refuses sorbitol.    I spent a total of 35 minutes on total care today due to team conference- >50% coordination of care.    LOS: 13 days A FACE TO FACE EVALUATION WAS PERFORMED  Sabino Denning 03/21/2021, 8:06 AM

## 2021-03-21 NOTE — Progress Notes (Signed)
Physical Therapy Session Note  Patient Details  Name: Crystal Park MRN: [id number] Date of Birth: [birth year]  Today's Date: 03/21/2021 PT Individual Time: 1300-1413 PT Individual Time Calculation (min): 73 min   Short Term Goals: Week 1:  PT Short Term Goal 1 (Week 1): Pt will perform sit <>stand transfers w/CGA and LRAD consistently PT Short Term Goal 1 - Progress (Week 1): Met PT Short Term Goal 2 (Week 1): Pt will ambulate 40' w/LRAD and CGA PT Short Term Goal 2 - Progress (Week 1): Met PT Short Term Goal 3 (Week 1): Pt will intiate stair training w/min A to prepare for DC home PT Short Term Goal 3 - Progress (Week 1): Met Week 2:  PT Short Term Goal 1 (Week 2): =LTGs d/t ELOS  Skilled Therapeutic Interventions/Progress Updates:    pt received in bed and agreeable to therapy. No complaint of pain. Supine>sit mod I with bed features. Stand pivot transfer mod I to w/c. Pt donned shoes with set up a including foot up brace. Session focused on gait training for endurance. Pt demoes narrow BOS and low step clearance BIL, L>R, as well as BIL inversion at the ankle, all of which worsens with fatigue. Mod improvement with VC for incr knee flexion to improve step height and length on LLE. Pt performed x 3 min walk and x 4.5 min walk with distant supervision, occ VC. Pt then requested to toilet, ambulated to bathroom. Distant supervision for 3/3 toileting tasks, assist to check for thorough cleansing. Continent B+B void noted. Hand hygiene in standing at sink, distant supervision. Pt then ambulated to therapy gym ~20 ft. Pt directed in NMR for BLE as follows: split stance knee drives with BUE support fading to UUE support on last bout. Pt with most difficulty both standing on and lifting RLE. Focused cueing on appropriate movement pattern for gait with upright posture. With UUE support, occ LOB with CGA or placing hand on RW to recover. Pt sat on EOM with no back support during rest breaks. Pt then  directed in standing marches with fingertip support on RW x 1 bout. Pt then ambulated back to room in the same manner and remained in TIS, was left with all needs in reach and alarm active.   Therapy Documentation Precautions:  Precautions Precautions: Fall Restrictions Weight Bearing Restrictions: No General:     Therapy/Group: Individual Therapy  Mickel Fuchs 03/21/2021, 1:33 PM

## 2021-03-21 NOTE — Patient Care Conference (Signed)
Inpatient RehabilitationTeam Conference and Plan of Care Update Date: 03/21/2021   Time: 11:46 AM    Patient Name: ELAIJAH Park      Medical Record Number: [id number]  Date of Birth: 22-Mar-1960 Sex: Female         Room/Bed: 5C18C/5C18C-01 Payor Info: Payor: [employer] / Plan: CIGNA MANAGED / Product Type: *No Product type* /    Admit Date/Time:  03/08/2021  3:17 PM  Primary Diagnosis:  Multiple sclerosis [facility])  Hospital Problems: Principal Problem:   Multiple sclerosis [facility])    Expected Discharge Date: Expected Discharge Date: 03/23/21  Team Members Present: Physician leading conference: Dr. Courtney Heys Social Worker Present: Ovidio Kin, LCSW Nurse Present: Dorien Chihuahua, RN PT Present: Ailene Rud, PT OT Present: Lillia Corporal, OT SLP Present: Charolett Bumpers, SLP PPS Coordinator present : Gunnar Fusi, SLP     Current Status/Progress Goal Weekly Team Focus  Bowel/Bladder   Patient is incontinent of bladder mostly and at times incontinent of bowel LBM 1/15. No in/out caths required in >48hrs  Pt will gain continence of bladder  Will assess q shift and PRN   Swallow/Nutrition/ Hydration             ADL's   shower level bathing Set up/Supervision, dressing Supervision, ADL transfers Supervision with RW, very fatigued and slow movements, dizziness limiting  Supervision (downgraded from Mod I)  DC planning, ADL retraining, energy conservation, standing balance, OOB   Mobility   largely supervision, gait up to 3 min  mod I transfers, supervision gait  endurance, walking program   Communication             Safety/Cognition/ Behavioral Observations            Pain   Patient denies pain  Pt will remain pain free  Will assess q shit and PRN.   Skin   Skin intact. Minimal redness to buttocks-using barrier cream  Pt's skin will remain intact  Assess skin q shift and PRN     Discharge Planning:  Husband has been here for therapies and aware of wife's care needs.  Preparing for discharge, referral sent to OP to schedule appointments   Team Discussion: Patient has been voiding and not required intermittent catheterization for over a week now. Monitoring post void bladder scan volumes; low residuals noted. Constipation noted but refusing laxatives at present. BP better however still quite fatigued.  Patient on target to meet rehab goals: yes, currently needs supervision for ambulation; mod I with a RW due to right foot drag with brace. Completes showering with supervision assist. Goals for discharge set for mod I overall.  *See Care Plan and progress notes for long and short-term goals.   Revisions to Treatment Plan:  Home walking program   Teaching Needs: Safety, transfers, toileting, etc  Current Barriers to Discharge: Decreased caregiver support  Possible Resolutions to Barriers: Family education OP follow up services Has a rollator, transport chair, and shower seat DME: [facility]     Medical Summary Current Status: low energy- not requiring cathing anymore- bladder scans are ~100cc- not requiring caths anymore-  Barriers to Discharge: Other (comments);Decreased family/caregiver support;Home enviroment access/layout;Neurogenic Bowel & Bladder;Medical stability  Barriers to Discharge Comments: poor energy- fatigue biggest lmiter and MS Sx's. Possible Resolutions to [employer] Focus: Supervision most ADLs/mobility- goals mod I- her way is safe- d/c 1/19- no family training needs ot be scheduled.   Continued Need for Acute Rehabilitation Level of Care: The patient requires daily medical management by  a physician with specialized training in physical medicine and rehabilitation for the following reasons: Direction of a multidisciplinary physical rehabilitation program to maximize functional independence : Yes Medical management of patient stability for increased activity during participation in an intensive rehabilitation regime.: Yes Analysis of  laboratory values and/or radiology reports with any subsequent need for medication adjustment and/or medical intervention. : Yes   I attest that I was present, lead the team conference, and concur with the assessment and plan of the team.   Dorien Chihuahua B 03/21/2021, 3:07 PM

## 2021-03-21 NOTE — Progress Notes (Signed)
Occupational Therapy Session Note  Patient Details  Name: Crystal Park MRN: [id number] Date of Birth: 1960/10/28  Today's Date: 03/21/2021 OT Individual Time: 3664-4034 OT Individual Time Calculation (min): 45 min  and Today's Date: 03/21/2021 OT Missed Time: 15 Minutes Missed Time Reason: Other (comment) (previous pt care)   Short Term Goals: Week 1:  OT Short Term Goal 1 (Week 1): Pt will complete toileting with min assist for both bowel and bladder self cath at [facility]. OT Short Term Goal 1 - Progress (Week 1): Progressing toward goal OT Short Term Goal 2 (Week 1): Pt will complete LB dressing with min assist OT Short Term Goal 2 - Progress (Week 1): Met OT Short Term Goal 3 (Week 1): Pt will complete UB/LB bathing using AE as needed with CGA. OT Short Term Goal 3 - Progress (Week 1): Met OT Short Term Goal 4 (Week 1): Pt will complete BSC transfer with CGA. OT Short Term Goal 4 - Progress (Week 1): Met Week 2:  OT Short Term Goal 1 (Week 2): STG = LTG 2/2 ELOS   Skilled Therapeutic Interventions/Progress Updates:    Pt greeted at time of session 15 minute late 2/2 previous pt care, no pain throughout session. Pt bed level resting with family present. Pt fatigued but willing to participate. Declined bathing/dressing today. Supine > sit Mod I and walked short distance to sink level with Supervision, oral hygiene in standing. Wheelchair transport to/from room <> ortho gym. Simulated walk in shower transfer at home with threshold to step over, grab bar on right and across shower before sitting on shower seat. Performed x2 trials first without AD and second time with AD as her RW will fit in the shower. Remainder of session on BUE strengthening with 3# dowel for bicep curl, chest press, overhead press for 1x12-15 with static hold overhead for passive stretch. Also performed chest openers with mirror for feedback to promote extension and decreased flexed posture. Back in room, feeling too  fatigued to sit up and ambulated to bed supervision. Alarm on call bell in reach.   Therapy Documentation Precautions:  Precautions Precautions: Fall Restrictions Weight Bearing Restrictions: No     Therapy/Group: Individual Therapy  Viona Gilmore 03/21/2021, 7:24 AM

## 2021-03-21 NOTE — Progress Notes (Signed)
Patient ID: Crystal Park, female   DOB: 1960/12/12, 61 y.o.   MRN: [id number]  Met with pt, husband and daughter present in her room to update her regarding team conference progress toward her goals and discharge still 1/19. OP referral has been sent and husband will get a call regarding setting up appointments. Pt resting in between therapies. Work toward discharge Thursday

## 2021-03-22 ENCOUNTER — Other Ambulatory Visit (HOSPITAL_COMMUNITY): Payer: Self-pay

## 2021-03-22 ENCOUNTER — Encounter: Payer: Managed Care, Other (non HMO) | Admitting: Occupational Therapy

## 2021-03-22 ENCOUNTER — Ambulatory Visit: Payer: Managed Care, Other (non HMO) | Admitting: Physical Therapy

## 2021-03-22 MED ORDER — DIAZEPAM 5 MG PO TABS
5.0000 mg | ORAL_TABLET | Freq: Three times a day (TID) | ORAL | 0 refills | Status: DC | PRN
  Filled 2021-03-22: qty 20, 7d supply, fill #0

## 2021-03-22 MED ORDER — LEVOTHYROXINE SODIUM 88 MCG PO TABS
88.0000 ug | ORAL_TABLET | Freq: Every day | ORAL | 0 refills | Status: AC
Start: 2021-03-22 — End: ?
  Filled 2021-03-22: qty 30, 30d supply, fill #0

## 2021-03-22 MED ORDER — MIRABEGRON ER 50 MG PO TB24
50.0000 mg | ORAL_TABLET | Freq: Every day | ORAL | 0 refills | Status: DC
  Filled 2021-03-22: qty 30, 30d supply, fill #0

## 2021-03-22 MED ORDER — DIMETHYL FUMARATE 240 MG PO CPDR
1.0000 | DELAYED_RELEASE_CAPSULE | Freq: Two times a day (BID) | ORAL | 0 refills | Status: DC
  Filled 2021-03-22: qty 60, 30d supply, fill #0

## 2021-03-22 MED ORDER — SENNOSIDES-DOCUSATE SODIUM 8.6-50 MG PO TABS: 2.0000 | ORAL_TABLET | Freq: Two times a day (BID) | ORAL | Status: DC

## 2021-03-22 MED ORDER — ASPIRIN-ACETAMINOPHEN-CAFFEINE 250-250-65 MG PO TABS
1.0000 | ORAL_TABLET | Freq: Four times a day (QID) | ORAL | 0 refills | Status: AC | PRN
Start: 2021-03-22 — End: ?
  Filled 2021-03-22: qty 10, 3d supply, fill #0

## 2021-03-22 MED ORDER — ACETAMINOPHEN 325 MG PO TABS: 325.0000 mg | ORAL_TABLET | ORAL | Status: AC | PRN

## 2021-03-22 MED ORDER — PROPRANOLOL HCL 20 MG PO TABS
20.0000 mg | ORAL_TABLET | Freq: Every day | ORAL | 0 refills | Status: DC
  Filled 2021-03-22: qty 30, 30d supply, fill #0

## 2021-03-22 NOTE — Discharge Summary (Signed)
Physician Discharge Summary  Patient ID: Crystal Park MRN: [id number] DOB/AGE: March 27, 1960 61 y.o.  Admit date: 03/08/2021 Discharge date: 03/23/2021  Discharge Diagnoses:  Principal Problem:   Multiple sclerosis ([place]) Active Problems:   Gait disturbance   Neurogenic bladder   Slow transit constipation   Discharged Condition: stable  Significant Diagnostic Studies: N/A  Labs:  Basic Metabolic Panel: BMP Latest Ref Rng & Units 03/20/2021 03/13/2021 03/09/2021  Glucose 70 - 99 mg/dL 102(H) 115(H) 109(H)  BUN 6 - 20 mg/dL 19 14 13   Creatinine 0.44 - 1.00 mg/dL 0.65 0.51 0.50  BUN/Creat Ratio 9 - 23 - - -  Sodium 135 - 145 mmol/L 138 136 133(L)  Potassium 3.5 - 5.1 mmol/L 3.7 3.9 4.2  Chloride 98 - 111 mmol/L 107 99 101  CO2 22 - 32 mmol/L 25 25 25   Calcium 8.9 - 10.3 mg/dL 8.9 9.5 8.9     CBC: CBC Latest Ref Rng & Units 03/20/2021 03/13/2021 03/09/2021  WBC 4.0 - 10.5 K/uL 9.8 11.7(H) 11.8(H)  Hemoglobin 12.0 - 15.0 g/dL 12.1 12.9 14.5  Hematocrit 36.0 - 46.0 % 36.2 38.0 42.8  Platelets 150 - 400 K/uL 481(H) 348 302     CBG: No results for input(s): GLUCAP in the last 168 hours.   Brief HPI:   Crystal Park is a 61 y.o. female history of MS with neurogenic bladder, dizziness and chronic right-sided weakness, BPPV who was admitted on 02/14/2021 with reports of abdominal pain with diarrhea, N/V, poor p.o. intake, weakness and inability to walk.  She was treated with IV fluids for viral gastroenteritis.  CT abdomen/pelvis was negative for acute abnormality and MRI brain/cervical/thoracic spine was negative for active demyelination.  Dr. Tack/neurology felt that patient with severe exacerbation of MS symptoms and no further work-up needed.  Urine culture did grow out E. coli and she was started on Keflex prior to discharge to rehab.  Therapy was ongoing and patient was noted to be deconditioned with weakness affecting mobility and ADLs.  CIR was recommended due to functional  decline.   Hospital Course: Crystal Park was admitted to rehab 03/08/2021 for inpatient therapies to consist of PT and OT at least three hours five days a week. Past admission physiatrist, therapy team and rehab RN have worked together to provide customized collaborative inpatient rehab. Blood pressures were monitored on TID basis and have been relatively stable. She completed 7 day course of Keflex for treatment of E coli UTI. She was started on every 4 hours cath as at home but was noted to have low volumes and incontinence between caths therefore she was encouraged to increase intake and eventually was voiding without need for I/O caths. Her Oxybutynin was titrated to 50 mg TID and she has been able to void without needs without incontinence.   Follow up CBC showed that leucocytosis due to UTI has resolved and H/H is stable. Check of BMET showed that lytes and renal status to be WNL.  Propranolol was added and titrated upwards to manage tachycardia.  Intermittent constipation has been managed with as needed use of sorbitol. She has been limited by fatigue and decreased endurance requiring adjustment of therapy schedule. Family will assist as needed after discharge. She will continue to receive follow up outpatient PT and OT at [facility] neuro rehab after discharge  Rehab course: During patient's stay in rehab weekly team conferences were held to monitor patient's progress, set goals and discuss barriers to discharge. At admission, patient required  mod assist with ADLs tasks and min assist with mobility. She  has had improvement in activity tolerance, balance, postural control as well as ability to compensate for deficits.She is able complete ADL tasks with set up assist and supervision for bathing and dressing tasks. She requires supervision with transfers and mobility with frequent rest breaks. She is able to ambulate 300' with use of RW and family education has been completed with husband.    Discharge  disposition: 01-Home or Self Care  Diet: Regular  Special Instructions: Needs to follow-up with urology due to history of neurogenic bladder.  Discharge Instructions     Ambulatory referral to Physical Medicine Rehab   Complete by: As directed    Moderate complexity follow-up 1 to 2 weeks multiple sclerosis      Allergies as of 03/23/2021       Reactions   Adderall [amphetamine-dextroamphetamine] Other (See Comments)   constipation        Medication List     STOP taking these medications    cephALEXin 500 MG capsule Commonly known as: KEFLEX   oxybutynin 5 MG tablet Commonly known as: DITROPAN       TAKE these medications    acetaminophen 325 MG tablet Commonly known as: TYLENOL Take 1-2 tablets (325-650 mg total) by mouth every 4 (four) hours as needed for mild pain.   alendronate 70 MG tablet Commonly known as: FOSAMAX Take 70 mg by mouth once a week. Take with a full glass of water on an empty stomach.   aspirin-acetaminophen-caffeine 250-250-65 MG tablet Commonly known as: EXCEDRIN MIGRAINE Take 1 tablet by mouth every 6 (six) hours as needed for headache. What changed: how much to take   bismuth subsalicylate 99991111 MG chewable tablet Commonly known as: PEPTO BISMOL Chew 524 mg by mouth as needed for indigestion or diarrhea or loose stools.   CRANBERRY PO Take 1 capsule by mouth daily.   diazepam 5 MG tablet Commonly known as: Valium Take 1 tablet (5 mg total) by mouth 3 (three) times daily as needed (dizziness).   Dimethyl Fumarate 240 MG Cpdr Take 1 capsule (240 mg total) by mouth 2 (two) times daily.   levothyroxine 88 MCG tablet Commonly known as: SYNTHROID Take 1 tablet (88 mcg total) by mouth daily before breakfast.   mirabegron ER 50 MG Tb24 tablet Commonly known as: MYRBETRIQ Take 1 tablet (50 mg total) by mouth daily.   multivitamin tablet Take 1 tablet by mouth daily.   propranolol 20 MG tablet Commonly known as: INDERAL Take  1 tablet (20 mg total) by mouth at bedtime.   senna-docusate 8.6-50 MG tablet Commonly known as: Senokot-S Take 2 tablets by mouth 2 (two) times daily.        Follow-up Information     Lovorn, Jinny Blossom, MD Follow up.   Specialty: Physical Medicine and Rehabilitation Why: As needed Contact information: [id number] [place] [address] [phone]         Britt Bottom, MD Follow up.   Specialty: Neurology Why: call for appointment Contact information: [place] [address] [phone]                 Signed: Bary Leriche 03/27/2021, 5:47 PM

## 2021-03-22 NOTE — Progress Notes (Signed)
PROGRESS NOTE   Subjective/Complaints:  Pt reports has a urologist- doesn't remember name; still leaking bladder wise, but not cathing anymore.  LBM yesterday.   ROS:   Pt denies SOB, abd pain, CP, N/V/C/D, and vision changes    Objective:   No results found. Recent Labs    03/20/21 0501  WBC 9.8  HGB 12.1  HCT 36.2  PLT 481*    Recent Labs    03/20/21 0501  NA 138  K 3.7  CL 107  CO2 25  GLUCOSE 102*  BUN 19  CREATININE 0.65  CALCIUM 8.9     Intake/Output Summary (Last 24 hours) at 03/22/2021 0800 Last data filed at 03/21/2021 1800 Gross per 24 hour  Intake 600 ml  Output --  Net 600 ml        Physical Exam: Vital Signs Blood pressure 99/63, pulse 74, temperature (!) 97.5 F (36.4 C), resp. rate 16, height 5\' 6"  (1.676 m), weight 56.2 kg, SpO2 97 %.      General: awake, alert, appropriate, low energy- laying supine in bed; NAD HENT: conjugate gaze; oropharynx moist CV: regular rate; no JVD Pulmonary: CTA B/L; no W/R/R- good air movement GI: soft, NT, ND, (+)BS Psychiatric: appropriate Neurological: low energy; fatigued- alert Musculoskeletal:- hyperextension of thumbs B/L  Neurological:     Comments: Flat affect with soft voice. Nystagmus right lateral vision present. Right sided weakness with tight heel cord. 2+DTRs. RUE 3/5, LUE 4/5, RLE 3/5 HF and otherwise 4/5, LLE 4/5. --motor exam stable. Sensation intact.  No increase in tone.  Skin: dry, warm   Assessment/Plan: 1. Functional deficits which require 3+ hours per day of interdisciplinary therapy in a comprehensive inpatient rehab setting. Physiatrist is providing close team supervision and 24 hour management of active medical problems listed below. Physiatrist and rehab team continue to assess barriers to discharge/monitor patient progress toward functional and medical goals  Care Tool:  Bathing    Body parts bathed by  patient: Right arm, Left arm, Chest, Front perineal area, Abdomen, Buttocks, Right upper leg, Left upper leg, Face, Left lower leg, Right lower leg         Bathing assist Assist Level: Supervision/Verbal cueing     Upper Body Dressing/Undressing Upper body dressing   What is the patient wearing?: Pull over shirt    Upper body assist Assist Level: Set up assist    Lower Body Dressing/Undressing Lower body dressing      What is the patient wearing?: Incontinence brief, Pants     Lower body assist Assist for lower body dressing: Minimal Assistance - Patient > 75%     Toileting Toileting    Toileting assist Assist for toileting: Supervision/Verbal cueing     Transfers Chair/bed transfer  Transfers assist     Chair/bed transfer assist level: Supervision/Verbal cueing     Locomotion Ambulation   Ambulation assist      Assist level: Supervision/Verbal cueing Assistive device: Walker-rolling Max distance: 100   Walk 10 feet activity   Assist     Assist level: Supervision/Verbal cueing Assistive device: Walker-rolling   Walk 50 feet activity   Assist Walk 50 feet with 2 turns  activity did not occur: Safety/medical concerns (Fatigue, MS exacerbation)  Assist level: Supervision/Verbal cueing Assistive device: Walker-rolling    Walk 150 feet activity   Assist Walk 150 feet activity did not occur: Safety/medical concerns         Walk 10 feet on uneven surface  activity   Assist Walk 10 feet on uneven surfaces activity did not occur: Safety/medical concerns (Fatigue, MS exacerbation)         Wheelchair     Assist Is the patient using a wheelchair?: Yes Type of Wheelchair: Manual    Wheelchair assist level: Supervision/Verbal cueing Max wheelchair distance: 100'    Wheelchair 50 feet with 2 turns activity    Assist        Assist Level: Supervision/Verbal cueing   Wheelchair 150 feet activity     Assist       Assist Level: Dependent - Patient 0%   Blood pressure 99/63, pulse 74, temperature (!) 97.5 F (36.4 C), resp. rate 16, height 5\' 6"  (1.676 m), weight 56.2 kg, SpO2 97 %.  Medical Problem List and Plan: 1. Functional deficits secondary to MS/AKI             -patient may shower             -ELOS/Goals: 1/19,  mod I  -con't CIR- d/c tomorrow 2.  Impaired mobility: Continue             -antiplatelet therapy: N/A.  3. Headaches: Resume Excedrin prn for HA.  --oxycodone prn. 1/12- pain seems controlled  1/16- denies pain- con't regimen d: LCSW to follow for evaluation and support.              -antipsychotic agents: N/A 5. Neuropsych: This patient is capable of making decisions on her own behalf. 6. Skin/Wound Care: Routine pressure relief measures.  7. Fluids/Electrolytes/Nutrition: Monitor I/O. Intake poor and variable at baseline per family. 8. E coli UTI: keflex completed        Resolved  9. Neurogenic bladder: emptying well low PVRs<284ml - Encourage patient to increase fluid intake.  -increased myrbetriq to 50mg  q am 1/11 -monitor for pattern, likely will have ongoing incontinence 1/16- still having bladder leakage, but no side effects from Myrbetriq which was increased 1/12 1/17- emptying- con't Myrbetriq- no caths required right now.   1/18- will have pt f/u with Urology after d.c.  10. Hypokalemia: Question etiology--has been supplemented for the past 2 days.             --recheck level in am.  1/5- K+ 4.2- -->3.9 1/9. Encourage K+ in diet--recheck labs Monday  1/17- K+ 3.7-  11. Leucocytosis: Likely due to UTI--recheck CBC in am.   1/5- WBC 11.8- -->11.7 1/9  1/17- WBC 9.8- 12. Chronic fatigue: Ampyra ineffective and unable to tolerate stimulants due to SE. Overnight voiding/caths not helping either 13. Tachycardia: TSH normal : start propanolol 10mg  HS Vitals:   03/21/21 1953 03/22/21 0440  BP: 109/68 99/63  Pulse: 87 74  Resp:  16  Temp: 98.1 F (36.7 C) (!)  97.5 F (36.4 C)  SpO2: 98% 97%   1/16- BP/HR controlled- con't regimen 14. Constipation: continues to be an issues. Increased Senna S to bid w/myrbetriq on board..  --sorbitol with results 1/10  1/16- said took days to keep having Bms after sorbitol- not sure if wants again.   1/17- LBM 2 days ago- refuses sorbitol.   1/18- LBM yesterday- con't regimen     LOS:  14 days A FACE TO FACE EVALUATION WAS PERFORMED  Mollye Guinta 03/22/2021, 8:00 AM

## 2021-03-22 NOTE — Progress Notes (Signed)
Physical Therapy Discharge Summary ° °Patient Details  °Name: Crystal Park °MRN: [id number] °Date of Birth: 09/28/1960 ° °Today's Date: 03/22/2021 °PT Individual Time: 0945-1100 °PT Individual Time Calculation (min): 75 min  ° ° °Patient has met 7 of 7 long term goals due to improved activity tolerance, improved balance, increased strength, ability to compensate for deficits, and improved coordination.  Patient to discharge at an ambulatory level Supervision.   Patient's care partner is independent to provide the necessary physical assistance at discharge. Bed mobility mod I with HOB elevated, per home set up. Pt able to ambulate with RW or rollator up to 300 ft but fatigues quickly. Pt provided with walking program to continue building strength and endurance in conjunction with OPPT care. Pt navigated stairs with supervision and 2 hand rails, curb with CGA and RW.Family education with pt's husband Andy throughout stay, who expressed understanding and demoed appropriate guarding/assist.  ° °Reasons goals not met: NA ° °Recommendation:  °Patient will benefit from ongoing skilled PT services in outpatient setting to continue to advance safe functional mobility, address ongoing impairments in balance, weakness, progressive MS symptoms, and minimize fall risk. ° °Equipment: °No equipment provided ° °Reasons for discharge: treatment goals met and discharge from hospital ° °Patient/family agrees with progress made and goals achieved: Yes ° °Skilled Therapeutic Interventions/Progress Updates: °pt received in bed and agreeable to therapy. Bed mobility mod I with HOB elevated per home set up. Pt donned shoes with set up. ambulatory transfer to bathroom with supervision, distant supervision for 3/3 toileting tasks. Hand hygiene in standing mod I. Pt then transported in TIS to therapy gym for time and energy conservation. Seated rest breaks throughout for energy conservation. Pt's husband, Andy present for family education,  discussed various Pt performed car transfer with supervision. Navigated ramp and mulch with supervision and RW, curb with CGA. Stair navigation 3 x 4 with supervision and 2 hand rails. Gait trial with rollator x ~130 ft and then back to room (with several seated rest breaks). Pt reports decr effort with gait using rollator, no LOB noted. Extensive education on walking program and d/c planning throughout session. On return to room, pt returned to bed mod I and was left with all needs in reach and alarm active.  ° °PT Discharge °Precautions/Restrictions °Precautions °Precautions: Fall °Restrictions °Weight Bearing Restrictions: No ° °Pain Interference °Pain Interference °Pain Effect on Sleep: 1. Rarely or not at all °Pain Interference with Therapy Activities: 1. Rarely or not at all °Pain Interference with Day-to-Day Activities: 1. Rarely or not at all °Vision/Perception  °Perception °Perception: Within Functional Limits °Praxis °Praxis: Intact  °Cognition °Overall Cognitive Status: Within Functional Limits for tasks assessed °Orientation Level: Oriented X4 °Year: 2023 °Month: January °Day of Week: Correct °Focused Attention: Appears intact °Sensation °Sensation °Peripheral sensation comments: Pt reports chronic numbness/tingling in B feet °Light Touch Impaired Details: Impaired LLE;Impaired RLE °Hot/Cold: Appears Intact °Proprioception: Appears Intact °Motor  °Motor °Motor: Other (comment) °Motor - Skilled Clinical Observations: Global deconditioning and MS related weakness °Motor - Discharge Observations: Pt reports laregly returned to baseline MS symptoms  °Mobility °Bed Mobility °Rolling Right: Independent with assistive device °Rolling Left: Independent with assistive device °Supine to Sit: Independent with assistive device °Sit to Supine: Independent with assistive device °Transfers °Sit to Stand: Independent with assistive device °Stand to Sit: Independent with assistive device °Transfer (Assistive device):  Rolling walker °Locomotion  °Gait °Ambulation: Yes °Gait Assistance: Supervision/Verbal cueing °Gait Distance (Feet): 300 Feet °Assistive device: Rolling walker;4-wheeled walker °Gait °  Gait: Yes Gait Pattern: Impaired Gait Pattern: Step-to pattern;Decreased step length - left;Decreased step length - right;Decreased stride length;Decreased dorsiflexion - right;Decreased dorsiflexion - left;Poor foot clearance - left;Poor foot clearance - right;Shuffle;Narrow base of support Stairs / Additional Locomotion Stairs: Yes Stairs Assistance: Supervision/Verbal cueing Stair Management Technique: Two rails Number of Stairs: 12 Height of Stairs: 6 Pick up small object from the floor assist level: Independent with assistive device Pick up small object from the floor assistive device: reacher Wheelchair Mobility Wheelchair Mobility: No  Trunk/Postural Assessment  Cervical Assessment Cervical Assessment: Within Functional Limits Thoracic Assessment Thoracic Assessment: Within Functional Limits Lumbar Assessment Lumbar Assessment: Within Functional Limits Postural Control Postural Control: Deficits on evaluation Trunk Control: decreased 2/2 fatigue; delayed righting reactions imptoved from baseline  Balance Balance Balance Assessed: Yes Static Sitting Balance Static Sitting - Balance Support: Feet supported;Bilateral upper extremity supported Static Sitting - Level of Assistance: 6: Modified independent (Device/Increase time) Dynamic Sitting Balance Dynamic Sitting - Balance Support: Feet supported;Left upper extremity supported Dynamic Sitting - Level of Assistance: 6: Modified independent (Device/Increase time) Static Standing Balance Static Standing - Balance Support: During functional activity;Left upper extremity supported Static Standing - Level of Assistance: 6: Modified independent (Device/Increase time) Dynamic Standing Balance Dynamic Standing - Balance Support: During functional  activity;Left upper extremity supported Dynamic Standing - Level of Assistance: 6: Modified independent (Device/Increase time) Extremity Assessment      RLE Assessment RLE Assessment: Exceptions to [facility] RLE Strength RLE Overall Strength: Deficits;Due to premorbid status Right Hip Flexion: 3+/5 Right Knee Extension: 3+/5 Right Ankle Dorsiflexion: 3+/5 Right Ankle Plantar Flexion: 3+/5 LLE Assessment LLE Assessment: Exceptions to [facility] LLE Strength LLE Overall Strength: Deficits;Due to premorbid status Left Hip Flexion: 3+/5 Left Knee Flexion: 4-/5 Left Knee Extension: 4-/5 Left Ankle Dorsiflexion: 3+/5 Left Ankle Plantar Flexion: 3+/5    Rilan Eiland C Dionna Wiedemann 03/22/2021, 12:30 PM

## 2021-03-22 NOTE — Progress Notes (Signed)
Occupational Therapy Discharge Summary  Patient Details  Name: Crystal Park MRN: [id number] Date of Birth: [birth year]    Patient has met 8 of 8 long term goals due to improved activity tolerance, improved balance, postural control, and improved coordination.  Patient to discharge at overall Supervision - Mod I level.  Patient's care partner is independent to provide the necessary physical assistance at discharge.  Pt is Mod I with sit <> stands, and transfers within room. Pt is Supervision for toilet transfers in a small space, shower transfers with home set up, and set up/supervision for bathing and dressing tasks 2/2 occasional truncal lean and weakness/fatigue 2/2 MS symptoms. Pt has been significantly limited by fatigue and decreased endurance but has made progress during hospitalization. Family have been present for several sessions to observe pt performance, they are aware to provide Supervision during ADLs 2/2 weakness and fatigue.   Reasons goals not met: NA  Recommendation:  Patient will benefit from ongoing skilled OT services in outpatient setting to continue to advance functional skills in the area of BADL, iADL, and Reduce care partner burden.  Equipment: BSC recommended, already has shower seat   Reasons for discharge: treatment goals met and discharge from hospital  Patient/family agrees with progress made and goals achieved: Yes  OT Discharge Precautions/Restrictions  Precautions Precautions: Fall Restrictions Weight Bearing Restrictions: No Pain Pain Assessment Pain Scale: 0-10 ADL ADL Eating: Set up Where Assessed-Eating: Bed level Grooming: Setup Where Assessed-Grooming:  (pt politely refused) Upper Body Bathing: Setup Lower Body Bathing: Supervision/safety Upper Body Dressing: Setup Where Assessed-Upper Body Dressing: Edge of bed Lower Body Dressing: Supervision/safety Where Assessed-Lower Body Dressing: Edge of bed Toileting:  Supervision/safety Toilet Transfer: Distant supervision Toilet Transfer Method: Magazine features editor: Close supervision Vision Baseline Vision/History: 1 Wears glasses Patient Visual Report: No change from baseline Perception  Perception: Within Functional Limits Praxis Praxis: Intact Cognition Overall Cognitive Status: Within Functional Limits for tasks assessed Arousal/Alertness: Lethargic (lethargic 2/2 chronic MS fatigue but functional) Orientation Level: Oriented X4 Year: 2023 Month: January Day of Week: Correct Memory: Appears intact Immediate Memory Recall: Sock;Blue;Bed Memory Recall Sock: Without Cue Memory Recall Blue: Without Cue Memory Recall Bed: Without Cue Awareness: Appears intact Problem Solving: Impaired Safety/Judgment: Appears intact Sensation Sensation Light Touch: Impaired Detail Peripheral sensation comments: Pt reports chronic numbness/tingling in B feet (R>L) chronic Light Touch Impaired Details: Impaired RLE Coordination Gross Motor Movements are Fluid and Coordinated: No Fine Motor Movements are Fluid and Coordinated: No Coordination and Movement Description: Affected by fatigue, global deconditioning 2/2 MS Finger Nose Finger Test: Slowed bilaterally but WNL Motor  Motor Motor: Abnormal postural alignment and control (flexed posture, weak core, fatigues quickly) Motor - Skilled Clinical Observations: Global deconditioning and MS related weakness Mobility  Bed Mobility Bed Mobility: Rolling Right;Rolling Left;Supine to Sit;Sit to Supine Rolling Right: Independent with assistive device Rolling Left: Independent with assistive device Supine to Sit: Independent with assistive device Sit to Supine: Independent with assistive device Transfers Sit to Stand: Independent with assistive device Stand to Sit: Independent with assistive device  Trunk/Postural Assessment  Cervical Assessment Cervical Assessment: Within Functional  Limits Thoracic Assessment Thoracic Assessment: Exceptions to [facility] (flexed posture) Lumbar Assessment Lumbar Assessment: Within Functional Limits Postural Control Postural Control: Deficits on evaluation Trunk Control: decreased 2/2 fatigue; delayed righting reactions improved from baseline  Balance Balance Balance Assessed: Yes Static Sitting Balance Static Sitting - Balance Support: Feet supported;Bilateral upper extremity supported Static Sitting - Level of Assistance: 6: Modified independent (  Device/Increase time) Dynamic Sitting Balance Dynamic Sitting - Balance Support: Feet supported;Left upper extremity supported Dynamic Sitting - Level of Assistance: 6: Modified independent (Device/Increase time) Dynamic Sitting - Balance Activities: Forward lean/weight shifting;Reaching across midline;Lateral lean/weight shifting Static Standing Balance Static Standing - Balance Support: During functional activity;Left upper extremity supported Static Standing - Level of Assistance: 6: Modified independent (Device/Increase time) Dynamic Standing Balance Dynamic Standing - Balance Support: During functional activity;Left upper extremity supported Dynamic Standing - Level of Assistance: 5: Stand by assistance (in shower for bathing tasks) Extremity/Trunk Assessment RUE Assessment RUE Assessment: Within Functional Limits LUE Assessment LUE Assessment: Within Functional Limits   Viona Gilmore 03/22/2021, 4:37 PM

## 2021-03-22 NOTE — Progress Notes (Signed)
Inpatient Rehabilitation Discharge Medication Review by a Pharmacist  A complete drug regimen review was completed for this patient to identify any potential clinically significant medication issues.  High Risk Drug Classes Is patient taking? Indication by Medication  Antipsychotic No   Anticoagulant No   Antibiotic No   Opioid No   Antiplatelet No   Hypoglycemics/insulin No   Vasoactive Medication yes Propranolol - HTN  Chemotherapy No   Other Yes Dimethyl Fumarate for MS Synthroid for low thyroid Fosamax for osteoporosis Diazepam for dizizness/anxiety Myrbetriq for overactive bladder     Type of Medication Issue Identified Description of Issue Recommendation(s)  Drug Interaction(s) (clinically significant)     Duplicate Therapy     Allergy     No Medication Administration End Date     Incorrect Dose     Additional Drug Therapy Needed     Significant med changes from prior encounter (inform family/care partners about these prior to discharge).    Other       Clinically significant medication issues were identified that warrant physician communication and completion of prescribed/recommended actions by midnight of the next day:  No  Pharmacist comments: None  Time spent performing this drug regimen review (minutes):  20 minutes

## 2021-03-22 NOTE — Progress Notes (Signed)
Occupational Therapy Session Note  Patient Details  Name: Crystal Park MRN: [id number] Date of Birth: Mar 25, 1960  Today's Date: 03/22/2021 OT Individual Time: 1400-1509 OT Individual Time Calculation (min): 69 min    Short Term Goals: Week 1:  OT Short Term Goal 1 (Week 1): Pt will complete toileting with min assist for both bowel and bladder self cath at [facility]. OT Short Term Goal 1 - Progress (Week 1): Progressing toward goal OT Short Term Goal 2 (Week 1): Pt will complete LB dressing with min assist OT Short Term Goal 2 - Progress (Week 1): Met OT Short Term Goal 3 (Week 1): Pt will complete UB/LB bathing using AE as needed with CGA. OT Short Term Goal 3 - Progress (Week 1): Met OT Short Term Goal 4 (Week 1): Pt will complete BSC transfer with CGA. OT Short Term Goal 4 - Progress (Week 1): Met Week 2:  OT Short Term Goal 1 (Week 2): STG = LTG 2/2 ELOS   Skilled Therapeutic Interventions/Progress Updates:    Pt greeted at time of session semireclined bed level resting, husband [name] present who remained throughout session. Pt agreeable to shower level bathing for last day in prep for DC home tomorrow. Ambulated bed > bathroom Supervision with RW and transferred to commode and shower seat all in same manner. Doffed clothing seated and standing, 3/3 toileting tasks with distant supervision before transitioning to shower. UB/LB bathing with set up/supervision only for brief stands in a wet environment, but mostly seated and only standing briefly. Pt utilizing LHS for washing back and BLEs thoroughly. Dried off seated before walking short distance to commode to dress. UB/LB dress with set up/Supervision with brief made into underwear to simulate home use. Donned gripper socks Set up as well before walking to sink level for oral hygiene and brushing hair. Pt utilizing rollator for seat when performing these tasks at sink level. Ambulated back to bed, politely declined sitting up in wheelchair  today, Mod I/supervision and alarm on call bell in reach. Returned with BUE strengthening and ROM HEP as performed yesterday during session, hand out provided and reviewed with the pt. No questions or concerns for pt or family for DC tomorrow.   Therapy Documentation Precautions:  Precautions Precautions: Fall Restrictions Weight Bearing Restrictions: No     Therapy/Group: Individual Therapy  Viona Gilmore 03/22/2021, 12:44 PM

## 2021-03-22 NOTE — TOC Benefit Eligibility Note (Signed)
Patient Advocate Encounter   Received notification that prior authorization for Myrbetriq ER 50 mg is required.   PA submitted on 03/22/2021 Key [id number] Status is pending       Roland Earl, CPhT Pharmacy Patient Advocate Specialist [facility] Patient Advocate Team Direct Number: [phone]  Fax: [phone]

## 2021-03-22 NOTE — Progress Notes (Signed)
Had a quite night. Remains alert and oriented. Denies pain. Continent of bladder. Safety maintained.

## 2021-03-22 NOTE — Progress Notes (Signed)
Inpatient Rehabilitation Care Coordinator Discharge Note   Patient Details  Name: Crystal Park MRN: [id number] Date of Birth: [birth year]   Discharge location: HOME WITH HUSBAND WHO WAS ASSISTING PRIOR TO ADMISSION  Length of Stay:  15 DAYS  Discharge activity level: SUPERVISION-CGA LEVEL  Home/community participation: ACTIVE  Patient response OI:ZTIWPY Literacy - How often do you need to have someone help you when you read instructions, pamphlets, or other written material from your doctor or pharmacy?: Never  Patient response KD:XIPJAS Isolation - How often do you feel lonely or isolated from those around you?: Never  Services provided included: MD, RD, PT, OT, RN, CM, Pharmacy, SW  Financial Services:  Financial Services Utilized: Medicare    Choices offered to/list presented to: PT AND HUSBAND  Follow-up services arranged:  Outpatient    Outpatient Servicies: [facility] & OT WILL CONTACT HUSBAND TO SET UP APPOINTMENTS      Patient response to transportation need: Is the patient able to respond to transportation needs?: Yes In the past 12 months, has lack of transportation kept you from medical appointments or from getting medications?: No In the past 12 months, has lack of transportation kept you from meetings, work, or from getting things needed for daily living?: No    Comments (or additional information): HUSBAND AND DAUGHTER HERE DAILY AND PROVIDE SUPPORT TO PT. ALL FEEL PREPARED FOR DISCHARGE HOME. HUSBAND HAS BEN HERE AND COMFORTABLE WITH HER CARE.  Patient/Family verbalized understanding of follow-up arrangements:  Yes  Individual responsible for coordination of the follow-up plan: ANDREW-HUSBAND [phone]  Confirmed correct DME delivered: [name] 03/22/2021    Kyngston Pickelsimer, Lemar Livings

## 2021-03-23 ENCOUNTER — Other Ambulatory Visit (HOSPITAL_COMMUNITY): Payer: Self-pay

## 2021-03-23 NOTE — Progress Notes (Signed)
PROGRESS NOTE   Subjective/Complaints:  Crystal Park reports ready for d/c today- we discussed that she has Neurologist, however suggest a PM&R doctor and also needs to see Urology after d/c- since not needing cathing anymore- D/w sister who is allowed to hear her medical issues- also mentioned Dr Sherron Monday who is neuro-urologist and suggest seeing him as well.   She doesn't need to see me NOW , but do suggest having a PM&R esp because when her MS acts up/has flare, has significant decline.    ROS:   Crystal Park denies SOB, abd pain, CP, N/V/C/D, and vision changes     Objective:   No results found. No results for input(s): WBC, HGB, HCT, PLT in the last 72 hours.   No results for input(s): NA, K, CL, CO2, GLUCOSE, BUN, CREATININE, CALCIUM in the last 72 hours.    Intake/Output Summary (Last 24 hours) at 03/23/2021 0847 Last data filed at 03/23/2021 0828 Gross per 24 hour  Intake 360 ml  Output --  Net 360 ml        Physical Exam: Vital Signs Blood pressure 97/60, pulse 78, temperature 98 F (36.7 C), temperature source Oral, resp. rate 18, height 5\' 6"  (1.676 m), weight 56.2 kg, SpO2 96 %.       General: awake, alert, appropriate, laying supine in bed; NAD HENT: conjugate gaze; oropharynx moist CV: regular rate; no JVD Pulmonary: CTA B/L; no W/R/R- good air movement GI: soft, NT, ND, (+)BS Psychiatric: appropriate- fatigued Neurological: alert- low energy- decreased memory in am's.  Musculoskeletal:- hyperextension of thumbs B/L  Neurological:     Comments: Flat affect with soft voice. Nystagmus right lateral vision present. Right sided weakness with tight heel cord. 2+DTRs. RUE 3/5, LUE 4/5, RLE 3/5 HF and otherwise 4/5, LLE 4/5. --motor exam stable. Sensation intact.  No increase in tone.  Skin: dry, warm   Assessment/Plan: 1. Functional deficits which require 3+ hours per day of interdisciplinary therapy in a  comprehensive inpatient rehab setting. Physiatrist is providing close team supervision and 24 hour management of active medical problems listed below. Physiatrist and rehab team continue to assess barriers to discharge/monitor patient progress toward functional and medical goals  Care Tool:  Bathing    Body parts bathed by patient: Right arm, Left arm, Chest, Front perineal area, Abdomen, Buttocks, Right upper leg, Left upper leg, Face, Left lower leg, Right lower leg         Bathing assist Assist Level: Set up assist     Upper Body Dressing/Undressing Upper body dressing   What is the patient wearing?: Pull over shirt    Upper body assist Assist Level: Set up assist    Lower Body Dressing/Undressing Lower body dressing      What is the patient wearing?: Incontinence brief, Pants     Lower body assist Assist for lower body dressing: Supervision/Verbal cueing     Toileting Toileting    Toileting assist Assist for toileting: Supervision/Verbal cueing     Transfers Chair/bed transfer  Transfers assist     Chair/bed transfer assist level: Independent with assistive device Chair/bed transfer assistive device:   Ambulation assist  Assist level: Supervision/Verbal cueing Assistive device: Walker-rolling Max distance: 300   Walk 10 feet activity   Assist     Assist level: Supervision/Verbal cueing Assistive device: Walker-rolling   Walk 50 feet activity   Assist Walk 50 feet with 2 turns activity did not occur: Safety/medical concerns (Fatigue, MS exacerbation)  Assist level: Supervision/Verbal cueing Assistive device: Walker-rolling    Walk 150 feet activity   Assist Walk 150 feet activity did not occur: Safety/medical concerns  Assist level: Supervision/Verbal cueing Assistive device: Walker-rolling    Walk 10 feet on uneven surface  activity   Assist Walk 10 feet on uneven surfaces activity did not  occur: Safety/medical concerns (Fatigue, MS exacerbation)   Assist level: Supervision/Verbal cueing Assistive device: Walker-rolling   Wheelchair     Assist Is the patient using a wheelchair?: Yes Type of Wheelchair: Manual    Wheelchair assist level: Dependent - Patient 0% (Crystal Park uses transport chair at home, utilized TIS chair for back support to relieve dizziness during rehab stay) Max wheelchair distance: 100'    Wheelchair 50 feet with 2 turns activity    Assist        Assist Level: Dependent - Patient 0%   Wheelchair 150 feet activity     Assist      Assist Level: Dependent - Patient 0%   Blood pressure 97/60, pulse 78, temperature 98 F (36.7 C), temperature source Oral, resp. rate 18, height 5\' 6"  (1.676 m), weight 56.2 kg, SpO2 96 %.  Medical Problem List and Plan: 1. Functional deficits secondary to MS/AKI             -patient may shower             -ELOS/Goals: 1/19,  mod I  -d/c today- not cathing right now- bladder scans show she's emptying down to <100cc most times- will have her f/u with Urology- suggest Dr 2/19.  2.  Impaired mobility: Continue             -antiplatelet therapy: N/A.  3. Headaches: Resume Excedrin prn for HA.  --oxycodone prn. 1/12- pain seems controlled  1/16- denies pain- con't regimen 1/19- not really taking pain meds- oxy not on list anymore- not taking Valium-  d: LCSW to follow for evaluation and support.              -antipsychotic agents: N/A 5. Neuropsych: This patient is capable of making decisions on her own behalf. 6. Skin/Wound Care: Routine pressure relief measures.  7. Fluids/Electrolytes/Nutrition: Monitor I/O. Intake poor and variable at baseline per family. 8. E coli UTI: keflex completed        Resolved  9. Neurogenic bladder: emptying well low PVRs<218ml - Encourage patient to increase fluid intake.  -increased myrbetriq to 50mg  q am 1/11 -monitor for pattern, likely will have ongoing  incontinence 1/16- still having bladder leakage, but no side effects from Myrbetriq which was increased 1/12 1/17- emptying- con't Myrbetriq- no caths required right now.   1/18- will have Crystal Park f/u with Urology after d.c.   1/19- discussed with Crystal Park and sister 10. Hypokalemia: Question etiology--has been supplemented for the past 2 days.             --recheck level in am.  1/5- K+ 4.2- -->3.9 1/9. Encourage K+ in diet--recheck labs Monday  1/17- K+ 3.7-  11. Leucocytosis: Likely due to UTI--recheck CBC in am.   1/5- WBC 11.8- -->11.7 1/9  1/17- WBC 9.8- 12. Chronic fatigue: Ampyra ineffective and  unable to tolerate stimulants due to SE. Overnight voiding/caths not helping either 13. Tachycardia: TSH normal : start propanolol 10mg  HS Vitals:   03/22/21 2200 03/23/21 0439  BP: 116/67 97/60  Pulse: 89 78  Resp: 18 18  Temp:  98 F (36.7 C)  SpO2: 96% 96%   1/16- BP/HR controlled- con't regimen 14. Constipation: continues to be an issues. Increased Senna S to bid w/myrbetriq on board..  --sorbitol with results 1/10  1/16- said took days to keep having Bms after sorbitol- not sure if wants again.   1/17- LBM 2 days ago- refuses sorbitol.   1/18- LBM yesterday- con't regimen  I spent a total of 32 minuteson total care today- >50% coordination of care- d/w Crystal Park AND sister and PA about d/c meds.       LOS: 15 days A FACE TO FACE EVALUATION WAS PERFORMED  Crystal Park 03/23/2021, 8:47 AM

## 2021-03-23 NOTE — Progress Notes (Signed)
INPATIENT REHABILITATION DISCHARGE NOTE   Discharge instructions by: Jesusita Oka, PA  Verbalized understanding: yes, by patient and spouse  Skin care/Wound care healing: no skin issues  Pain: none  IV's: none  Tubes/Drains: none  O2: room air  Safety instructions: Fall risk  Patient belongings: home medication brought up from pharmacy and returned to patient.  Discharged to: home  Discharged via: personal vehicle  Notes:

## 2021-03-28 ENCOUNTER — Ambulatory Visit: Payer: Managed Care, Other (non HMO)

## 2021-03-28 ENCOUNTER — Encounter: Payer: Managed Care, Other (non HMO) | Admitting: Occupational Therapy

## 2021-03-29 ENCOUNTER — Other Ambulatory Visit: Payer: Self-pay

## 2021-03-29 ENCOUNTER — Encounter: Payer: Self-pay | Admitting: Physical Medicine and Rehabilitation

## 2021-03-29 ENCOUNTER — Encounter
Payer: Managed Care, Other (non HMO) | Attending: Physical Medicine and Rehabilitation | Admitting: Physical Medicine and Rehabilitation

## 2021-03-29 VITALS — BP 110/76 | HR 89 | Ht 66.0 in | Wt 131.4 lb

## 2021-03-29 DIAGNOSIS — R339 Retention of urine, unspecified: Secondary | ICD-10-CM | POA: Insufficient documentation

## 2021-03-29 DIAGNOSIS — N319 Neuromuscular dysfunction of bladder, unspecified: Secondary | ICD-10-CM | POA: Insufficient documentation

## 2021-03-29 DIAGNOSIS — R269 Unspecified abnormalities of gait and mobility: Secondary | ICD-10-CM | POA: Insufficient documentation

## 2021-03-29 DIAGNOSIS — G35 Multiple sclerosis: Secondary | ICD-10-CM | POA: Insufficient documentation

## 2021-03-29 NOTE — Patient Instructions (Signed)
°  Pt is a 61yr old female with hx of MS- relapsing remitting- and s/p E Coli UTI and rehab admission as well as neurogenic bladder-   Here for hospital f/u.   Suggest speaking to Neurology about using Amantadine for fatigue issues.   2.  To see Urology about cathing/bladder- and see if Myrbetriq is appropriate to continue. Suggest taking it instead of Oxybutynin- not taking right now. To see Urology- Dr Junious Silk.    3. Take OTC stool softeners- Colace is usually the best stool sotener I've had pts use.   4. Con't Rolator- to reduce chance of falls and start therapy end of month.    5. F/U in 6 months   6. My focus is on the function of patient not the pathology.

## 2021-03-29 NOTE — Progress Notes (Signed)
Subjective:    Patient ID: Crystal Park, female    DOB: 08-15-1960, 61 y.o.   MRN: [id number]  HPI Pt is a 61yr old female with hx of MS- relapsing remitting- and s/p E Coli UTI and rehab admission as wlel as neurogenic bladder-   Here for hospital f/u.   Doing OK-  Went back to cathing-medium to a lot when caths-  Just leaking in hospital, so want's really peeing on her own. Caths 4-5x/day.   Will set up appt with Urology- here in [place]- might be with [facility]- female doctor-  Not leaking anymore  Occ HA still-  Takes Excedrin- works for her.   Walking with rolator.  Still fatigued all the time. Has failed Ampyra and stimulants.   Bowels- stools hard- will get OTC stool softener- going regularly however.   Starting therapy at end of the month- outpt- [address]  PT and OT   Feels like pretty close ~ 75% back to baseline from before UTI.  Just fatigue and endurance- still feels weaker in R side and can move RLE now.    Pain Inventory Average Pain 0 Pain Right Now 0 My pain is  No pain  LOCATION OF PAIN  No pain  BOWEL Number of stools per week: 2 Oral laxative use Yes  Type of laxative Colace   BLADDER Foley In and out cath, frequency 4-5 times/day Able to self cath Yes     Mobility use a walker how many minutes can you walk? 12 ability to climb steps?  yes do you drive?  no use a wheelchair Do you have any goals in this area?  yes  Function disabled: date disabled 2015 Do you have any goals in this area?  no  Neuro/Psych bladder control problems weakness trouble walking depression anxiety  Prior Studies Any changes since last visit?  no  Physicians involved in your care Any changes since last visit?  no   Family History  Problem Relation Age of Onset   Cancer Mother    Cancer Father    Diabetes Sister    Social History   Socioeconomic History   Marital status: Married    Spouse name: Not on file   Number of  children: Not on file   Years of education: Not on file   Highest education level: Not on file  Occupational History   Occupation: disabled  Tobacco Use   Smoking status: Never   Smokeless tobacco: Never  Vaping Use   Vaping Use: Never used  Substance and Sexual Activity   Alcohol use: Not Currently    Comment: rare   Drug use: No   Sexual activity: Not Currently  Other Topics Concern   Not on file  Social History Narrative   Not on file   Social Determinants of Health   Financial Resource Strain: Not on file  Food Insecurity: Not on file  Transportation Needs: Not on file  Physical Activity: Not on file  Stress: Not on file  Social Connections: Not on file   Past Surgical History:  Procedure Laterality Date   BREAST LUMPECTOMY Right    benign   COLONOSCOPY     CYSTOSCOPY WITH INJECTION N/A 04/23/2017   Procedure: CYSTOSCOPY WITH INJECTION/ BOTOX 200 UNITS;  Surgeon: Jerilee Field, MD;  Location: [facility];  Service: Urology;  Laterality: N/A;   OOPHORECTOMY Left    2013   TONSILLECTOMY AND ADENOIDECTOMY     age 56  TRANSURETHRAL RESECTION OF BLADDER TUMOR N/A 04/23/2017   Procedure: TRANSURETHRAL RESECTION OF BLADDER TUMOR (TURBT);  Surgeon: Jerilee Field, MD;  Location: [facility];  Service: Urology;  Laterality: N/A;   Past Medical History:  Diagnosis Date   Benign paroxysmal positional vertigo 10/12/2014   Constipation    Family history of breast cancer    Headache    history of migaines   History of kidney stones    History of MRSA infection    History of normocytic normochromic anemia    MS (multiple sclerosis) (HCC)    diagnosed 9/16   Neurogenic bladder    self-caths   PONV (postoperative nausea and vomiting)    BP 110/76    Pulse 89    Ht 5\' 6"  (1.676 m)    Wt 131 lb 6.4 oz (59.6 kg)    BMI 21.21 kg/m   Opioid Risk Score:   Fall Risk Score:  `1  Depression screen PHQ 2/9  Depression screen [facility] 2/9  03/29/2021 05/16/2016  Decreased Interest 3 2  Down, Depressed, Hopeless 2 0  PHQ - 2 Score 5 2  Altered sleeping 2 1  Tired, decreased energy 3 3  Change in appetite 1 2  Feeling bad or failure about yourself  2 0  Trouble concentrating 2 3  Moving slowly or fidgety/restless 0 0  Suicidal thoughts 0 0  PHQ-9 Score 15 11  Difficult doing work/chores Very difficult Very difficult     Review of Systems  Constitutional: Negative.   HENT: Negative.    Eyes: Negative.   Respiratory: Negative.    Cardiovascular: Negative.   Gastrointestinal: Negative.   Endocrine: Negative.   Genitourinary: Negative.   Musculoskeletal:  Positive for gait problem.  Skin: Negative.   Allergic/Immunologic: Negative.   Neurological:  Positive for weakness.  Hematological: Negative.   Psychiatric/Behavioral:  Positive for dysphoric mood. The patient is nervous/anxious.       Objective:   Physical Exam  Awake, low energy; delayed response;s accompanied by daughter, using rolator, NAD MS: RUE- deltoid/biceps, Triceps 5-/5; WE, grip and FA 4+/5 LUE 5/5 in same muscles RLE_ HF 3-/5; KE 4+/5; KF 4/5; DF 3+/5; PF 4+/5 LLE- HF 4+/5 KE 4+/5; KF 4/5; DF 3+/5; and PF 4+/5  Has foot up brace on L- not attached-   Neuro: No clonus; no hoffman's B/L  No increased tone- actually slightly low tone in arms and legs B/L       Assessment & Plan:   Pt is a 61yr old female with hx of MS- relapsing remitting- and s/p E Coli UTI and rehab admission as well as neurogenic bladder-   Here for hospital f/u.   Suggest speaking to Neurology about using Amantadine for fatigue issues.   2.  To see Urology about cathing/bladder- and see if Myrbetriq is appropriate to continue. Suggest taking it instead of Oxybutynin- not taking right now. To see Urology- Dr 61yr.    3. Take OTC stool softeners- Colace is usually the best stool sotener I've had pts use.   4. Con't Rolator- to reduce chance of falls and start  therapy end of month.    5. F/U in 6 months   6. My focus is on the function of patient not the pathology.    I spent a total of 21 minutes on total visit- educating on PM&R and bladder/fatigue.

## 2021-03-30 ENCOUNTER — Ambulatory Visit: Payer: Managed Care, Other (non HMO)

## 2021-03-30 ENCOUNTER — Encounter: Payer: Managed Care, Other (non HMO) | Admitting: Occupational Therapy

## 2021-04-03 ENCOUNTER — Encounter: Payer: Managed Care, Other (non HMO) | Admitting: Occupational Therapy

## 2021-04-03 ENCOUNTER — Ambulatory Visit: Payer: Managed Care, Other (non HMO) | Admitting: Physical Therapy

## 2021-04-04 ENCOUNTER — Encounter: Payer: Self-pay | Admitting: Occupational Therapy

## 2021-04-04 ENCOUNTER — Ambulatory Visit: Payer: Managed Care, Other (non HMO) | Attending: Neurology | Admitting: Physical Therapy

## 2021-04-04 ENCOUNTER — Other Ambulatory Visit: Payer: Self-pay

## 2021-04-04 ENCOUNTER — Ambulatory Visit: Payer: Managed Care, Other (non HMO) | Admitting: Occupational Therapy

## 2021-04-04 DIAGNOSIS — M6281 Muscle weakness (generalized): Secondary | ICD-10-CM | POA: Diagnosis present

## 2021-04-04 DIAGNOSIS — G35 Multiple sclerosis: Secondary | ICD-10-CM | POA: Insufficient documentation

## 2021-04-04 DIAGNOSIS — R2689 Other abnormalities of gait and mobility: Secondary | ICD-10-CM

## 2021-04-04 DIAGNOSIS — R29818 Other symptoms and signs involving the nervous system: Secondary | ICD-10-CM

## 2021-04-04 DIAGNOSIS — R2681 Unsteadiness on feet: Secondary | ICD-10-CM | POA: Insufficient documentation

## 2021-04-04 NOTE — Therapy (Signed)
[facility] [address] [place], [place], [address] Phone: [phone]   Fax:  [phone]  Physical Therapy Evaluation  Patient Details  Name: Crystal Park MRN: [id number] Date of Birth: October 15, 1960 Referring Provider (PT): Courtney Heys, MD   Encounter Date: 04/04/2021   PT End of Session - 04/04/21 0930     Visit Number 1    Number of Visits 17    Date for PT Re-Evaluation 05/30/21    Authorization Type [employer], VL combined with PT & OT: 75 days,    Authorization - Visit Number 0    Authorization - Number of Visits 52    PT Start Time 0845    PT Stop Time 0925    PT Time Calculation (min) 40 min    Equipment Utilized During Treatment Gait belt    Activity Tolerance Patient tolerated treatment well    Behavior During Therapy [facility] for tasks assessed/performed             Past Medical History:  Diagnosis Date   Benign paroxysmal positional vertigo 10/12/2014   Constipation    Family history of breast cancer    Headache    history of migaines   History of kidney stones    History of MRSA infection    History of normocytic normochromic anemia    MS (multiple sclerosis) ([place])    diagnosed 9/16   Neurogenic bladder    self-caths   PONV (postoperative nausea and vomiting)     Past Surgical History:  Procedure Laterality Date   BREAST LUMPECTOMY Right    benign   COLONOSCOPY     CYSTOSCOPY WITH INJECTION N/A 04/23/2017   Procedure: CYSTOSCOPY WITH INJECTION/ BOTOX 200 UNITS;  Surgeon: Festus Aloe, MD;  Location: [facility];  Service: Urology;  Laterality: N/A;   OOPHORECTOMY Left    2013   TONSILLECTOMY AND ADENOIDECTOMY     age 54   TRANSURETHRAL RESECTION OF BLADDER TUMOR N/A 04/23/2017   Procedure: TRANSURETHRAL RESECTION OF BLADDER TUMOR (TURBT);  Surgeon: Festus Aloe, MD;  Location: [facility];  Service: Urology;  Laterality: N/A;    There were no  vitals filed for this visit.    Subjective Assessment - 04/04/21 0848     Subjective Pt was previously seen at this clinic in December 2022. Episode of care ended due to hospitalization. Pt notes increased R side weakness, went to the hospital, and was found to have a UTI. First week she was there she couldn't remember too much. Pt was in the hospital from 1/4 to 1/19. Pt did PT at the hospital and has gotten a little better. She returned home ~2 weeks ago. Pt states she has been fine since returning but reports walking has not been great in comparison to prior to her hospitalization. Has only been able to tolerate 4 min of walking total. Notes some increased difficulty with some of her ADLs/self care (i.e. drying hair) due to lack of endurance.    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    Patient Stated Goals Wants to be able to walk better.    Currently in Pain? No/denies  [facility] PT Assessment - 04/04/21 0001       Assessment   Medical Diagnosis G35 (ICD-10-CM) - Multiple sclerosis    Referring Provider (PT) Courtney Heys, MD    Onset Date/Surgical Date 01/12/21    Hand Dominance Right    Prior Therapy in 2018 at this location for vestibular rehab, 2022 at this location for PT, and most recently acute care PT/OT      Precautions   Precautions Fall      Balance Screen   Has the patient fallen in the past 6 months Yes    How many times? 1   right before hospital   Has the patient had a decrease in activity level because of a fear of falling?  Yes    Is the patient reluctant to leave their home because of a fear of falling?  Yes      [place] Private  residence    Living Arrangements Spouse/significant other    Available Help at Discharge Family    Type of [place] to enter    Entrance Stairs-Number of Steps 1    Entrance Stairs-Rails None    Home Layout Two level;Able to live on main level with bedroom/bathroom    Home Equipment Transport chair;Walker - 4 wheels;Grab bars - tub/shower;Grab bars - toilet;Shower seat      Prior Function   Leisure Used to love to read, quilting      Observation/Other Assessments   Focus on Therapeutic Outcomes (FOTO)  n/a      Strength   Right Hip Flexion 3-/5    Right Hip ABduction 2+/5    Right Hip ADduction 2+/5    Left Hip Flexion 3+/5    Left Hip ABduction 2+/5    Left Hip ADduction 2+/5    Right Knee Flexion 3-/5    Right Knee Extension 3+/5    Left Knee Flexion 4-/5    Left Knee Extension 4-/5    Right Ankle Dorsiflexion 3+/5    Right Ankle Plantar Flexion 3+/5    Left Ankle Dorsiflexion 3+/5    Left Ankle Plantar Flexion 3+/5      Transfers   Five time sit to stand comments  30 sec STS = 4 reps      Ambulation/Gait   Ambulation/Gait Assistance 5: Supervision    Ambulation Distance (Feet) 230 Feet    Assistive device 4-wheeled walker    Gait Pattern Step-through pattern;Decreased stride length;Decreased dorsiflexion - right;Decreased weight shift to right;Right foot flat;Left foot flat;Narrow base of support    Ambulation Surface Level;Indoor    Gait velocity 1.46 ft/sec    Gait Comments 2 MWT = 176'      Standardized Balance Assessment   Standardized Balance Assessment Five Times Sit to Stand    Five times sit to stand comments  35.47      Berg Balance Test   Sit to Stand Able to stand  independently using hands    Standing Unsupported Able to stand 2 minutes with supervision    Sitting with Back Unsupported but Feet Supported on Floor or Stool Able to sit safely and securely 2 minutes    Stand to Sit Controls descent by using hands    Transfers  Able to transfer safely, definite need of hands    Standing Unsupported with Eyes Closed Able to stand 10 seconds with supervision    Standing Unsupported with Feet Together Able to place  feet together independently and stand for 1 minute with supervision    From Standing, Reach Forward with Outstretched Arm Can reach forward >12 cm safely (5")    From Standing Position, Pick up Object from Floor Unable to try/needs assist to keep balance    From Standing Position, Turn to Look Behind Over each Shoulder Looks behind one side only/other side shows less weight shift    Turn 360 Degrees Able to turn 360 degrees safely but slowly    Standing Unsupported, Alternately Place Feet on Step/Stool Needs assistance to keep from falling or unable to try    Standing Unsupported, One Foot in [place] to take small step independently and hold 30 seconds    Standing on One Leg Tries to lift leg/unable to hold 3 seconds but remains standing independently    Total Score 33    Berg comment: 33/56      Timed Up and Go Test   Normal TUG (seconds) 29.25                        Objective measurements completed on examination: See above findings.                PT Education - 04/04/21 0929     Education Details Discussed current POC and continuing her walking HEP.    Person(s) Educated Patient    Methods Explanation    Comprehension Verbalized understanding              PT Short Term Goals - 04/04/21 1037       PT SHORT TERM GOAL #1   Title Pt will be independent with initial HEP with family supervision.    Time 4    Period Weeks    Status New    Target Date 05/02/21      PT SHORT TERM GOAL #2   Title Pt will demo Berg Balance Score of 40/56 to demo return to prior level from before her hospitalization    Baseline 33/56 on 04/04/21    Time 4    Period Weeks    Status New    Target Date 05/02/21      PT SHORT TERM GOAL #3   Title Pt will improve 5x sit <> stand  using UE support from chair to 25 seconds or less in order to demo improved functional BLE strength/decr fall risk.    Baseline 35.17 sec on 04/04/21    Time 4    Period Weeks    Status New    Target Date 05/02/21      PT SHORT TERM GOAL #4   Title Pt will improve gait speed with rollator to at least 1.8 ft/sec in order to demo decr fall risk.    Baseline 1.46 ft/sec with rollator.    Time 4    Period Weeks    Status New      PT SHORT TERM GOAL #5   Title Pt will be able to tolerate ambulating at least 6 min without rest    Baseline Only tolerating 4 min 04/04/21 per pt report    Time 4    Period Weeks    Status New    Target Date 05/02/21               PT Long Term Goals - 04/04/21 1040       PT LONG TERM GOAL #1   Title Pt will be independent with final HEP  with family supervision in order to build upon functional gains made in therapy.    Time 8    Period Weeks    Status New    Target Date 05/30/21      PT LONG TERM GOAL #2   Title Pt will improve BERG to at least a 46/56 in order to demo decr fall risk.    Baseline 33/56    Time 8    Period Weeks    Status New    Target Date 05/30/21      PT LONG TERM GOAL #3   Title Pt will improve 30 second chair stand to at least 7 sit <> stands using BUE support in order to demo improved strength/endurance.    Baseline 4 sit <> Stands on 04/04/21    Time 8    Period Weeks    Status New    Target Date 05/30/21      PT LONG TERM GOAL #4   Title Pt will perform TUG in 24 seconds or less with rollator vs. LRAD in order to demo decr fall risk.    Baseline 29.27 with rollator on 04/04/21    Time 8    Period Weeks    Status New    Target Date 05/30/21      PT LONG TERM GOAL #5   Title Pt will improve gait speed with rollator to at least 2.1 ft/sec in order to demo decr fall risk/improved efficiency with gait.    Baseline 1.42 ft/sec    Time 8    Period Weeks    Status New    Target Date 05/30/21      Additional Long  Term Goals   Additional Long Term Goals Yes      PT LONG TERM GOAL #6   Title Pt will increase 2 MWT distance to at least 239' to demo [facility] for MS population    Baseline 176' on 04/04/21    Time 8    Period Weeks    Status New    Target Date 05/30/21                    Plan - 04/04/21 1031     Clinical Impression Statement Ms. Linzi Darbonne is a 61 y/o F returns to OPPT for re-evaluation after a 2 week hospitalization due to a UTI. Pt has a PMH of RRMS. Pt demonstrates deconditioning with decreased balance (Berg Balance indicates a high fall risk and is lower than her prior score) and endurance since release from the hospital. Pt had completed 4 PT visits in 2022 prior to her hospitalization working on her strength, balance, and endurance. Pt was given exercises by acute care PT/OT and has been completing HEP at home. Pt would highly benefit from continuing PT to improve her mobility, balance, and activity tolerance for safe home and community amb.    Personal Factors and Comorbidities Comorbidity 1;Time since onset of injury/illness/exacerbation;Behavior Pattern;Past/Current Experience    Comorbidities PMH: RRMS (diagnosed 10/2014)    Examination-Activity Limitations Carry;Lift;Locomotion Level;Squat;Stairs;Transfers;Stand;Reach Overhead    Examination-Participation Restrictions Cleaning;Community Activity;Laundry    Stability/Clinical Decision Making Evolving/Moderate complexity    Clinical Decision Making Moderate    Rehab Potential Good    PT Frequency 2x / week    PT Duration 8 weeks    PT Treatment/Interventions ADLs/Self Care Home Management;Aquatic Therapy;DME Instruction;Stair training;Functional mobility training;Gait training;Balance training;Therapeutic activities;Neuromuscular re-education;Therapeutic exercise;Patient/family education;Passive range of motion;Vestibular;Manual techniques;Energy conservation;Taping    PT Next Visit  Plan Re initiate strengthening and  balance exercises. Work on [employer] account executive.    PT Home Exercise Plan Currently has walking program from acute care PT and upper body exercises    Recommended Other Services seeing OT    Consulted and Agree with Plan of Care Patient             Patient will benefit from skilled therapeutic intervention in order to improve the following deficits and impairments:  Abnormal gait, Decreased balance, Decreased activity tolerance, Decreased coordination, Decreased endurance, Decreased knowledge of use of DME, Decreased mobility, Decreased strength, Difficulty walking, Postural dysfunction, Pain  Visit Diagnosis: Multiple sclerosis (HCC)  Unsteadiness on feet  Muscle weakness (generalized)  Other abnormalities of gait and mobility     Problem List Patient Active Problem List   Diagnosis Date Noted   Multiple sclerosis ([place]) 03/08/2021   Hypersomnia 01/12/2021   High risk medication use 01/06/2020   Vitamin D deficiency 01/06/2020   Normocytic anemia 09/26/2016   Hematuria    Urinary retention    Slow transit constipation    Yeast infection    Acute lower UTI    Other complicated headache syndrome    Acute blood loss anemia    Reactive hypertension    Multiple sclerosis exacerbation ([place]) 04/24/2016   Neurogenic bladder 04/24/2016   Weakness of both arms    Weakness of both legs    Sepsis ([place]) 04/21/2016   Bilateral hydronephrosis 04/21/2016   AKI (acute kidney injury) ([place]) 04/21/2016   Sepsis secondary to UTI ([place]) 04/20/2016   Attention deficit disorder 02/23/2016   Abnormality of gait 11/24/2015   Generalized weakness 11/24/2015   Urinary urgency 11/24/2015   UTI (lower urinary tract infection) 10/29/2015   Leukocytosis 10/29/2015   Nausea and vomiting 10/29/2015   MS (multiple sclerosis) ([place]) 10/26/2014    Nuha Degner April Gordy Levan, PT, DPT 04/04/2021, 10:44 AM  [facility] [address] [place], [place], [address] Phone: [phone]   Fax:  [phone]  Name: Crystal Park MRN: [id number] Date of Birth: Sep 15, 1960

## 2021-04-04 NOTE — Therapy (Signed)
[facility] [address] [place], [place], [address] Phone: [phone]   Fax:  [phone]  Occupational Therapy Evaluation  Patient Details  Name: Crystal Park MRN: [id number] Date of Birth: 1960-09-30 Referring Provider (OT): Dr. Berline Chough   Encounter Date: 04/04/2021   OT End of Session - 04/04/21 1256     Visit Number 1    Number of Visits 25    Date for OT Re-Evaluation 06/27/21    Authorization Type [employer]    Authorization Time Period 12 weeks    Authorization - Visit Number 1    Authorization - Number of Visits 75   days, combined   OT Start Time 0935    OT Stop Time 1005    OT Time Calculation (min) 30 min             Past Medical History:  Diagnosis Date   Benign paroxysmal positional vertigo 10/12/2014   Constipation    Family history of breast cancer    Headache    history of migaines   History of kidney stones    History of MRSA infection    History of normocytic normochromic anemia    MS (multiple sclerosis) (HCC)    diagnosed 9/16   Neurogenic bladder    self-caths   PONV (postoperative nausea and vomiting)     Past Surgical History:  Procedure Laterality Date   BREAST LUMPECTOMY Right    benign   COLONOSCOPY     CYSTOSCOPY WITH INJECTION N/A 04/23/2017   Procedure: CYSTOSCOPY WITH INJECTION/ BOTOX 200 UNITS;  Surgeon: Jerilee Field, MD;  Location: [facility];  Service: Urology;  Laterality: N/A;   OOPHORECTOMY Left    2013   TONSILLECTOMY AND ADENOIDECTOMY     age 14   TRANSURETHRAL RESECTION OF BLADDER TUMOR N/A 04/23/2017   Procedure: TRANSURETHRAL RESECTION OF BLADDER TUMOR (TURBT);  Surgeon: Jerilee Field, MD;  Location: [facility];  Service: Urology;  Laterality: N/A;    There were no vitals filed for this visit.   Subjective Assessment - 04/04/21 0936     Currently in Pain? Yes    Pain Score 3     Pain Location Shoulder     Pain Orientation Right    Pain Descriptors / Indicators Aching    Pain Type Acute pain    Pain Onset 1 to 4 weeks ago    Pain Frequency Intermittent    Aggravating Factors  overuse    Pain Relieving Factors unknown               [facility] OT Assessment - 04/04/21 0938       Assessment   Medical Diagnosis G35 (ICD-10-CM) - Multiple sclerosis    Referring Provider (OT) Dr. Berline Chough    Onset Date/Surgical Date 01/12/21    Hand Dominance Right    Prior Therapy 2018 OT      Precautions   Precautions Fall      Balance Screen   Has the patient fallen in the past 6 months Yes    How many times? 1    Has the patient had a decrease in activity level because of a fear of falling?  Yes    Is the patient reluctant to leave their home because of a fear of falling?  No      Home  Environment   Family/patient expects to be discharged to: Private residence    Living Arrangements Spouse/significant other  Home Access --   1 step entry   Lives With Spouse      Prior Function   Level of Independence Independent with household mobility with device;Requires assistive device for independence;Needs assistance with homemaking;Independent with basic ADLs    Leisure Used to love to read, quilting      ADL   Eating/Feeding Needs assist with cutting food    Grooming Independent   difficulty drying hair   Upper Body Bathing Modified independent   walk in with shower seat and grab bars   Lower Body Bathing Modified independent    Upper Body Dressing Increased time   mod I   Lower Body Dressing Modified independent    Toileting - Clothing Manipulation Modified independent   uses rollator   Tub/Shower Transfer Modified independent      IADL   Shopping Completely unable to shop    Light Housekeeping Needs help with all home maintenance tasks    Meal Prep Needs to have meals prepared and served    Medication Management Takes responsibility if medication is prepared in advance in seperate dosage     Financial Management Dependent      Mobility   Mobility Status --   mod 1 with rollator     Written Expression   Dominant Hand Right      Vision - History   Baseline Vision Wears glasses for distance only      Vision Assessment   Vision Assessment Vision not tested      Cognition   Overall Cognitive Status Cognition to be further assessed in functional context PRN   Denies cogntive changes     Sensation   Light Touch Appears Intact      ROM / Strength   AROM / PROM / Strength AROM;Strength      AROM   Overall AROM  Deficits    Overall AROM Comments RUE shoulder flexion 120, LUE shoulder flexion 125, elbow extension -20      Strength   Overall Strength Deficits    Overall Strength Comments RUE grossly 3+/5, LUE proximal 3+/5, distal 4-/5                                OT Short Term Goals - 04/04/21 1236       OT SHORT TERM GOAL #1   Title Pt will be independent with initial HEP    Time 4    Period Weeks    Status New    Target Date 05/02/21      OT SHORT TERM GOAL #2   Title Pt will stand x 10 mins for ADL/functional tasks prior to rest break.    Baseline 5 mins    Time 4    Period Weeks    Status New      OT SHORT TERM GOAL #3   Title Pt will verbalize understanding of energy conservation strategies.    Time 4    Period Weeks    Status New      OT SHORT TERM GOAL #4   Title Pt will verbalize understanding of adapted strategies/ AE to maximize safety and I with ADLs/ IADLs .    Time 4    Period Weeks    Status New      OT SHORT TERM GOAL #5   Title Pt will retrieve a lightweight object at 125* shoulder flexion with RUE for increased functional use.  Baseline 120 shoulder flexion    Time 4    Period Weeks    Status New               OT Long Term Goals - 04/04/21 1238       OT LONG TERM GOAL #1   Title Pt will be independent with updated HEP.--check LTGs    Time 12    Period Weeks    Target Date 06/27/21      OT  LONG TERM GOAL #2   Title Pt will load the washing machine modified independently    Time 12    Period Weeks    Status New      OT LONG TERM GOAL #3   Title Pt will perfrom simple snack prep/ microwave cooking modified independently.    Time 12    Period Weeks    Status New      OT LONG TERM GOAL #4   Title Pt will improve R hand strength  to at least 40 lbs for increased ease with opening containers.    Time 12    Period Weeks    Status New                   Plan - 04/04/21 1243     Clinical Impression Statement Crystal Park is a 61 y.o. female  with diagnosis of relapsing remitting MS who was recently hospitalized 03/03/22 with n/v and abdominal pain pt was d/c home 03/23/21.Pt demonstrates a decline on overall strength and endurance.  Pt presents with the following deficits:decreased strength, decreased ROM, decreased endurance , decreased balance, decreased functional mobility which impedes performance of ADLS/IADLs. Pt can benefit from skilled occupational therapy to address these deficits in order to maximize pt's safety and I with daily activities.    OT Occupational Profile and History Problem Focused Assessment - Including review of records relating to presenting problem    Occupational performance deficits (Please refer to evaluation for details): ADL's;IADL's;Play;Leisure;Social Participation    Body Structure / Function / Physical Skills ADL;Balance;Mobility;Strength;Flexibility;UE functional use;FMC;Coordination;Gait;ROM;GMC;Decreased knowledge of precautions;Decreased knowledge of use of DME;Dexterity;IADL    Rehab Potential Good    Clinical Decision Making Limited treatment options, no task modification necessary    Comorbidities Affecting Occupational Performance: May have comorbidities impacting occupational performance    Modification or Assistance to Complete Evaluation  No modification of tasks or assist necessary to complete eval    OT Frequency 2x /  week   anticipate d/c after * weeks however 12 week POC written to account for scheduling   OT Duration 12 weeks    OT Treatment/Interventions Self-care/ADL training;Therapeutic exercise;Balance training;Functional Mobility Training;Ultrasound;Aquatic Therapy;Neuromuscular education;Manual Therapy;Therapeutic activities;Cryotherapy;Paraffin;DME and/or AE instruction;Fluidtherapy;Patient/family education;Passive range of motion;Moist Heat    Plan initate HEP for ROM, consider cane exercises then progressing to low range bands,(Therapist recommended pt does not use hand weights yet)    Consulted and Agree with Plan of Care Patient             Patient will benefit from skilled therapeutic intervention in order to improve the following deficits and impairments:   Body Structure / Function / Physical Skills: ADL, Balance, Mobility, Strength, Flexibility, UE functional use, FMC, Coordination, Gait, ROM, GMC, Decreased knowledge of precautions, Decreased knowledge of use of DME, Dexterity, IADL       Visit Diagnosis: Muscle weakness (generalized) - Plan: Ot plan of care cert/re-cert  Unsteadiness on feet - Plan: Ot plan of care cert/re-cert  Other abnormalities of gait and mobility - Plan: Ot plan of care cert/re-cert  Other symptoms and signs involving the nervous system - Plan: Ot plan of care cert/re-cert    Problem List Patient Active Problem List   Diagnosis Date Noted   Multiple sclerosis ([place]) 03/08/2021   Hypersomnia 01/12/2021   High risk medication use 01/06/2020   Vitamin D deficiency 01/06/2020   Normocytic anemia 09/26/2016   Hematuria    Urinary retention    Slow transit constipation    Yeast infection    Acute lower UTI    Other complicated headache syndrome    Acute blood loss anemia    Reactive hypertension    Multiple sclerosis exacerbation ([place]) 04/24/2016   Neurogenic bladder 04/24/2016   Weakness of both arms    Weakness of both legs    Sepsis ([place])  04/21/2016   Bilateral hydronephrosis 04/21/2016   AKI (acute kidney injury) ([place]) 04/21/2016   Sepsis secondary to UTI ([place]) 04/20/2016   Attention deficit disorder 02/23/2016   Abnormality of gait 11/24/2015   Generalized weakness 11/24/2015   Urinary urgency 11/24/2015   UTI (lower urinary tract infection) 10/29/2015   Leukocytosis 10/29/2015   Nausea and vomiting 10/29/2015   MS (multiple sclerosis) ([place]) 10/26/2014    Sylva Overley, OT 04/04/2021, 1:01 PM Theone Murdoch, OTR/L [phone] Phone: [phone] 1:01 PM 04/04/21  [facility] [address] [place], [place], [address] Phone: [phone]   Fax:  [phone]  Name: Crystal Park MRN: [id number] Date of Birth: Aug 09, 1960

## 2021-04-05 ENCOUNTER — Ambulatory Visit: Payer: Managed Care, Other (non HMO) | Admitting: Physical Therapy

## 2021-04-05 ENCOUNTER — Telehealth: Payer: Self-pay | Admitting: *Deleted

## 2021-04-05 ENCOUNTER — Encounter: Payer: Managed Care, Other (non HMO) | Admitting: Occupational Therapy

## 2021-04-05 DIAGNOSIS — Z0289 Encounter for other administrative examinations: Secondary | ICD-10-CM

## 2021-04-05 NOTE — Telephone Encounter (Signed)
Gave completed/signed attending physician statement-progress report back to MR to process for pt.

## 2021-04-10 ENCOUNTER — Encounter: Payer: Managed Care, Other (non HMO) | Admitting: Occupational Therapy

## 2021-04-10 ENCOUNTER — Ambulatory Visit: Payer: Managed Care, Other (non HMO) | Admitting: Physical Therapy

## 2021-04-12 ENCOUNTER — Ambulatory Visit: Payer: Managed Care, Other (non HMO) | Admitting: Physical Therapy

## 2021-04-12 ENCOUNTER — Encounter: Payer: Managed Care, Other (non HMO) | Admitting: Occupational Therapy

## 2021-04-17 ENCOUNTER — Ambulatory Visit: Payer: Managed Care, Other (non HMO) | Admitting: Occupational Therapy

## 2021-04-17 ENCOUNTER — Other Ambulatory Visit: Payer: Self-pay

## 2021-04-17 ENCOUNTER — Ambulatory Visit: Payer: Managed Care, Other (non HMO) | Attending: Family Medicine | Admitting: Physical Therapy

## 2021-04-17 ENCOUNTER — Encounter: Payer: Self-pay | Admitting: Physical Therapy

## 2021-04-17 DIAGNOSIS — G35 Multiple sclerosis: Secondary | ICD-10-CM | POA: Insufficient documentation

## 2021-04-17 DIAGNOSIS — R2681 Unsteadiness on feet: Secondary | ICD-10-CM | POA: Diagnosis not present

## 2021-04-17 DIAGNOSIS — R2689 Other abnormalities of gait and mobility: Secondary | ICD-10-CM | POA: Diagnosis present

## 2021-04-17 DIAGNOSIS — R29818 Other symptoms and signs involving the nervous system: Secondary | ICD-10-CM | POA: Insufficient documentation

## 2021-04-17 DIAGNOSIS — M6281 Muscle weakness (generalized): Secondary | ICD-10-CM | POA: Diagnosis present

## 2021-04-17 NOTE — Therapy (Signed)
[facility] [address] [place], [place], [address] Phone: [phone]   Fax:  [phone]  Physical Therapy Treatment  Patient Details  Name: Crystal Park MRN: [id number] Date of Birth: 1960-09-25 Referring Provider (PT): Genice Rouge, MD   Encounter Date: 04/17/2021   PT End of Session - 04/17/21 1319     Visit Number 2    Number of Visits 17    Date for PT Re-Evaluation 05/30/21    Authorization Type [employer], VL combined with PT & OT: 75 days,    Authorization - Visit Number 1    Authorization - Number of Visits 75    PT Start Time 1315    PT Stop Time 1356    PT Time Calculation (min) 41 min    Equipment Utilized During Treatment Gait belt    Activity Tolerance Patient tolerated treatment well    Behavior During Therapy WFL for tasks assessed/performed             Past Medical History:  Diagnosis Date   Benign paroxysmal positional vertigo 10/12/2014   Constipation    Family history of breast cancer    Headache    history of migaines   History of kidney stones    History of MRSA infection    History of normocytic normochromic anemia    MS (multiple sclerosis) (HCC)    diagnosed 9/16   Neurogenic bladder    self-caths   PONV (postoperative nausea and vomiting)     Past Surgical History:  Procedure Laterality Date   BREAST LUMPECTOMY Right    benign   COLONOSCOPY     CYSTOSCOPY WITH INJECTION N/A 04/23/2017   Procedure: CYSTOSCOPY WITH INJECTION/ BOTOX 200 UNITS;  Surgeon: Jerilee Field, MD;  Location: [facility];  Service: Urology;  Laterality: N/A;   OOPHORECTOMY Left    2013   TONSILLECTOMY AND ADENOIDECTOMY     age 38   TRANSURETHRAL RESECTION OF BLADDER TUMOR N/A 04/23/2017   Procedure: TRANSURETHRAL RESECTION OF BLADDER TUMOR (TURBT);  Surgeon: Jerilee Field, MD;  Location: [facility];  Service: Urology;  Laterality: N/A;    There were no  vitals filed for this visit.   Subjective Assessment - 04/17/21 1317     Subjective Had a fall - got up too fast and fell on her knees. Was able to pull herself up from her walker. Walking up to 6 minutes at home with the rollator.    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    Patient Stated Goals Wants to be able to walk better.    Currently in Pain? No/denies                               [facility] Adult PT Treatment/Exercise - 04/17/21 1344       Exercises   Exercises Other Exercises    Other Exercises  Seated SciFit at gear 1 with BUE/BLE for strength/ROM and endurance. Pre HR: 88 bpm, O2: 98%. Pt going for 1 minute at a time and then taking a rest break afterwards. Total for 4 minutes and 30 seconds. HR after: 86 bpm O2 98%  Access Code: [email or web address] URL: [email or web address] Date: 04/17/2021 Prepared by: Janann August  Reviewed/updated HEP from previous bout of outpatient therapy. See MedBridge for more details. Needing intermittent rest breaks.   Exercises Seated Long Arc Quad - 2 x daily - 5 x weekly - 1-2 sets - 10 reps Seated Hip Adduction Isometrics with Ball - 2 x daily - 5 x weekly - 2 sets - 5 reps Seated Hip Abduction with Resistance - 2 x daily - 5 x weekly - 2 sets - 10 reps - with use of yellow tband  Sit to Stand with Armchair - 2 x daily - 5 x weekly - 1 sets - 5 reps Standing March with Counter Support - 2 x daily - 5 x weekly - 1 sets - 5 reps - standing at rollator, 5 reps each leg.          PT Education - 04/17/21 1335     Education Details Reviewed/updated HEP from previous bout (end of 2022) and gave new handout.    Person(s)  Educated Patient    Methods Explanation;Demonstration;Handout    Comprehension Verbalized understanding;Returned demonstration              PT Short Term Goals - 04/04/21 1037       PT SHORT TERM GOAL #1   Title Pt will be independent with initial HEP with family supervision.    Time 4    Period Weeks    Status New    Target Date 05/02/21      PT SHORT TERM GOAL #2   Title Pt will demo Berg Balance Score of 40/56 to demo return to prior level from before her hospitalization    Baseline 33/56 on 04/04/21    Time 4    Period Weeks    Status New    Target Date 05/02/21      PT SHORT TERM GOAL #3   Title Pt will improve 5x sit <> stand using UE support from chair to 25 seconds or less in order to demo improved functional BLE strength/decr fall risk.    Baseline 35.17 sec on 04/04/21    Time 4    Period Weeks    Status New    Target Date 05/02/21      PT SHORT TERM GOAL #4   Title Pt will improve gait speed with rollator to at least 1.8 ft/sec in order to demo decr fall risk.    Baseline 1.46 ft/sec with rollator.    Time 4    Period Weeks    Status New      PT SHORT TERM GOAL #5   Title Pt will be able to tolerate ambulating at least 6 min without rest    Baseline Only tolerating 4 min 04/04/21 per pt report    Time 4    Period Weeks    Status New    Target Date 05/02/21               PT Long Term Goals - 04/04/21 1040       PT LONG TERM GOAL #1   Title Pt will be independent with final HEP with family supervision in order to build upon functional gains made in therapy.    Time 8    Period Weeks    Status New    Target Date 05/30/21      PT LONG TERM GOAL #2   Title Pt will improve BERG to at least a 46/56 in order  to demo decr fall risk.    Baseline 33/56    Time 8    Period Weeks    Status New    Target Date 05/30/21      PT LONG TERM GOAL #3   Title Pt will improve 30 second chair stand to at least 7 sit <> stands using BUE support in order to  demo improved strength/endurance.    Baseline 4 sit <> Stands on 04/04/21    Time 8    Period Weeks    Status New    Target Date 05/30/21      PT LONG TERM GOAL #4   Title Pt will perform TUG in 24 seconds or less with rollator vs. LRAD in order to demo decr fall risk.    Baseline 29.27 with rollator on 04/04/21    Time 8    Period Weeks    Status New    Target Date 05/30/21      PT LONG TERM GOAL #5   Title Pt will improve gait speed with rollator to at least 2.1 ft/sec in order to demo decr fall risk/improved efficiency with gait.    Baseline 1.42 ft/sec    Time 8    Period Weeks    Status New    Target Date 05/30/21      Additional Long Term Goals   Additional Long Term Goals Yes      PT LONG TERM GOAL #6   Title Pt will increase 2 MWT distance to at least 239' to demo [facility] for MS population    Baseline 176' on 04/04/21    Time 8    Period Weeks    Status New    Target Date 05/30/21                   Plan - 04/17/21 1352     Clinical Impression Statement Today's skilled session focused on initating HEP for strength/balance (reviewed and updated HEP from previous bout). Pt needing intermittent rest breaks in between. Initiated NuStep today for strengthening and endurance with pt able to go for ~1 minute bouts at a time and then needing rest breaks. HR/O2 WNL throughout. Will continue to progress towards LTGs.    Personal Factors and Comorbidities Comorbidity 1;Time since onset of injury/illness/exacerbation;Behavior Pattern;Past/Current Experience    Comorbidities PMH: RRMS (diagnosed 10/2014)    Examination-Activity Limitations Carry;Lift;Locomotion Level;Squat;Stairs;Transfers;Stand;Reach Overhead    Examination-Participation Restrictions Cleaning;Community Activity;Laundry    Stability/Clinical Decision Making Evolving/Moderate complexity    Rehab Potential Good    PT Frequency 2x / week    PT Duration 8 weeks    PT Treatment/Interventions ADLs/Self Care Home  Management;Aquatic Therapy;DME Instruction;Stair training;Functional mobility training;Gait training;Balance training;Therapeutic activities;Neuromuscular re-education;Therapeutic exercise;Patient/family education;Passive range of motion;Vestibular;Manual techniques;Energy conservation;Taping    PT Next Visit Plan how is HEP? NuStep. Work on Nutritional therapist.  Work on Advertising account executive.    PT Home Exercise Plan Currently has walking program from acute care PT and upper body exercises    Consulted and Agree with Plan of Care Patient             Patient will benefit from skilled therapeutic intervention in order to improve the following deficits and impairments:  Abnormal gait, Decreased balance, Decreased activity tolerance, Decreased coordination, Decreased endurance, Decreased knowledge of use of DME, Decreased mobility, Decreased strength, Difficulty walking, Postural dysfunction, Pain  Visit Diagnosis: Unsteadiness on feet  Muscle weakness (generalized)  Other symptoms and signs involving the nervous system  Other abnormalities  of gait and mobility     Problem List Patient Active Problem List   Diagnosis Date Noted   Multiple sclerosis ([place]) 03/08/2021   Hypersomnia 01/12/2021   High risk medication use 01/06/2020   Vitamin D deficiency 01/06/2020   Normocytic anemia 09/26/2016   Hematuria    Urinary retention    Slow transit constipation    Yeast infection    Acute lower UTI    Other complicated headache syndrome    Acute blood loss anemia    Reactive hypertension    Multiple sclerosis exacerbation ([place]) 04/24/2016   Neurogenic bladder 04/24/2016   Weakness of both arms    Weakness of both legs    Sepsis ([place]) 04/21/2016   Bilateral hydronephrosis 04/21/2016   AKI (acute kidney injury) ([place]) 04/21/2016   Sepsis secondary to UTI ([place]) 04/20/2016   Attention deficit disorder 02/23/2016   Abnormality of gait 11/24/2015   Generalized weakness 11/24/2015   Urinary  urgency 11/24/2015   UTI (lower urinary tract infection) 10/29/2015   Leukocytosis 10/29/2015   Nausea and vomiting 10/29/2015   MS (multiple sclerosis) ([place]) 10/26/2014    Arliss Journey, PT, DPT  04/17/2021, 2:21 PM  [facility] [address] [place], [place], [address] Phone: [phone]   Fax:  [phone]  Name: Crystal Park MRN: [id number] Date of Birth: Nov 15, 1960

## 2021-04-17 NOTE — Patient Instructions (Signed)
Access Code: [email or web address] URL: [email or web address] Date: 04/17/2021 Prepared by: Sherlie Ban  Exercises Seated Long Arc Quad - 2 x daily - 5 x weekly - 1-2 sets - 10 reps Seated Hip Adduction Isometrics with Ball - 2 x daily - 5 x weekly - 2 sets - 5 reps Seated Hip Abduction with Resistance - 2 x daily - 5 x weekly - 2 sets - 10 reps Sit to Stand with Armchair - 2 x daily - 5 x weekly - 1 sets - 5 reps Standing March with Counter Support - 2 x daily - 5 x weekly - 1 sets - 5 reps

## 2021-04-17 NOTE — Patient Instructions (Signed)
SELF ASSISTED WITH OBJECT: Shoulder Flexion - Sitting    Hold cane with both hands. Raise arms up slowly. Can also be done laying down. _10__ reps per set, _2__ sets per day.    Abduction (Eccentric) - Active (Cane)    SEATED: Lift cane out to Rt side with Rt palm up. Avoid hiking shoulder. Keep palm relaxed. Slowly lower affected arm for 3-5 seconds. Repeat_10__ reps per set. Repeat to Lt side with Lt palm up x 10 reps. Do__2_ sets per day   ROM: Extension - Wand (Standing)    Standing in front of counter, high table, or bed for balance, hold wand behind back, palms facing forwards. Raise arms back to approx 40 degrees.  Repeat _10___ times per set.  Do _2___ sessions per day.   Energy Conservation Techniques  Sit for as many activities as possible. Use slow, smooth movements.  Rushing increases discomfort. Determine the necessity of performing the task.  Simplify those tasks that are necessary.  (Get clothes out of the dryer when they are warm instead of ironing, let dishes air dry, etc.) Take frequent rests both during and between activities.  Avoid repetitive tasks. Pre-plan your activities; try a daily and/or weekly schedule.  Spread out the activities that are most fatiguing (break up cleaning tasks over multiple days). Remember to plan a balance of work, rest and recreation. Consider the best time for each activity.  Do the most exertive task when you have the most energy. Don't carry items if you can push them.  Slide, don't lift. Push, don't pull. Utilize two hands when appropriate. Maintain good posture and use proper body mechanics. Avoid remaining in one position for too long. When lifting, bend at the knees, not at the waist.  Exhale when bending down, inhale when straightening up. Carry objects as close to your body and as near to the center of the pelvis.  11. Avoid wasted body movements (position yourself for the task so that you avoid bending, twisting, etc.  when possible). 12. Select the best working environment.  Consider lighting, ventilation, clothing, and equipment. 13. Organize your storage areas, making the items you use daily convenient.  Store heaviest items at waist height.  Store frequently used items between shoulders and knee height.  Consider leaving frequently used items on countertops.  (You can organize in storage baskets based on time used/purpose). 14. Feelings and emotions can be real causes of fatigue.  Try to avoid unnecessary worry, irritation, or frustration.  Avoid stress, it can also be a source of fatigue. 15. Get help from other people for difficult tasks. 16. Explore equipment or items that may be able to do the job for you with greater ease.  (Electric can openers, blenders, lightweight items for cleaning, etc.)

## 2021-04-17 NOTE — Therapy (Signed)
[facility] [address] [place], [place], [address] Phone: [phone]   Fax:  [phone]  Occupational Therapy Treatment  Patient Details  Name: Crystal Park MRN: [id number] Date of Birth: [birth year] Referring Provider (OT): Dr. Dagoberto Ligas   Encounter Date: 04/17/2021   OT End of Session - 04/17/21 1312     Visit Number 2    Number of Visits 25    Date for OT Re-Evaluation 06/27/21    Authorization Type [employer]    Authorization Time Period 12 weeks    Authorization - Visit Number 2    Authorization - Number of Visits 75   days, combined   OT Start Time 1245    OT Stop Time 1315    OT Time Calculation (min) 30 min    Activity Tolerance Patient tolerated treatment well    Behavior During Therapy [facility] for tasks assessed/performed             Past Medical History:  Diagnosis Date   Benign paroxysmal positional vertigo 10/12/2014   Constipation    Family history of breast cancer    Headache    history of migaines   History of kidney stones    History of MRSA infection    History of normocytic normochromic anemia    MS (multiple sclerosis) ([place])    diagnosed 9/16   Neurogenic bladder    self-caths   PONV (postoperative nausea and vomiting)     Past Surgical History:  Procedure Laterality Date   BREAST LUMPECTOMY Right    benign   COLONOSCOPY     CYSTOSCOPY WITH INJECTION N/A 04/23/2017   Procedure: CYSTOSCOPY WITH INJECTION/ BOTOX 200 UNITS;  Surgeon: Festus Aloe, MD;  Location: [facility];  Service: Urology;  Laterality: N/A;   OOPHORECTOMY Left    2013   TONSILLECTOMY AND ADENOIDECTOMY     age 61   TRANSURETHRAL RESECTION OF BLADDER TUMOR N/A 04/23/2017   Procedure: TRANSURETHRAL RESECTION OF BLADDER TUMOR (TURBT);  Surgeon: Festus Aloe, MD;  Location: [facility];  Service: Urology;  Laterality: N/A;    There were no vitals filed for this visit.    Subjective Assessment - 04/17/21 1246     Pertinent History MS, sepsis due to UTI, hx of vertigo    Patient Stated Goals improve RUE functional use    Currently in Pain? No/denies    Pain Onset 1 to 4 weeks ago              See pt instructions and below for details on HEP and education.                     OT Education - 04/17/21 1310     Education Details cane HEP, energy conservation techniques, things to avoid (sweating, hot tubs, walking in heat of the day in summer)    Person(s) Educated Patient    Methods Explanation;Demonstration;Handout;Verbal cues    Comprehension Verbalized understanding;Returned demonstration              OT Short Term Goals - 04/17/21 1326       OT SHORT TERM GOAL #1   Title Pt will be independent with initial HEP    Time 4    Period Weeks    Status On-going    Target Date 05/02/21      OT SHORT TERM GOAL #2   Title Pt will stand x 10 mins for ADL/functional tasks prior  to rest break.    Baseline 5 mins    Time 4    Period Weeks    Status New      OT SHORT TERM GOAL #3   Title Pt will verbalize understanding of energy conservation strategies.    Time 4    Period Weeks    Status On-going      OT SHORT TERM GOAL #4   Title Pt will verbalize understanding of adapted strategies/ AE to maximize safety and I with ADLs/ IADLs .    Time 4    Period Weeks    Status New      OT SHORT TERM GOAL #5   Title Pt will retrieve a lightweight object at 125* shoulder flexion with RUE for increased functional use.    Baseline 120 shoulder flexion    Time 4    Period Weeks    Status New               OT Long Term Goals - 04/04/21 1238       OT LONG TERM GOAL #1   Title Pt will be independent with updated HEP.-    Time 12    Period Weeks    Target Date 06/27/21      OT LONG TERM GOAL #2   Title Pt will load the washing machine modified independently    Time 12    Period Weeks    Status New      OT LONG TERM  GOAL #3   Title Pt will perfrom simple snack prep/ microwave cooking modified independently.    Time 12    Period Weeks    Status New      OT LONG TERM GOAL #4   Title Pt will improve R hand strength  to at least 40 lbs for increased ease with opening containers.    Time 12    Period Weeks    Status New                   Plan - 04/17/21 1327     Clinical Impression Statement Pt returns for first time today after initial evaluation. Pt progressing towards 2 STG's.    OT Occupational Profile and History Problem Focused Assessment - Including review of records relating to presenting problem    Occupational performance deficits (Please refer to evaluation for details): ADL's;IADL's;Play;Leisure;Social Participation    Body Structure / Function / Physical Skills ADL;Balance;Mobility;Strength;Flexibility;UE functional use;FMC;Coordination;Gait;ROM;GMC;Decreased knowledge of precautions;Decreased knowledge of use of DME;Dexterity;IADL    Rehab Potential Good    Clinical Decision Making Limited treatment options, no task modification necessary    Comorbidities Affecting Occupational Performance: May have comorbidities impacting occupational performance    Modification or Assistance to Complete Evaluation  No modification of tasks or assist necessary to complete eval    OT Frequency 2x / week   anticipate d/c after * weeks however 12 week POC written to account for scheduling   OT Duration 12 weeks    OT Treatment/Interventions Self-care/ADL training;Therapeutic exercise;Balance training;Functional Mobility Training;Ultrasound;Aquatic Therapy;Neuromuscular education;Manual Therapy;Therapeutic activities;Cryotherapy;Paraffin;DME and/or AE instruction;Fluidtherapy;Patient/family education;Passive range of motion;Moist Heat    Plan review cane HEP and energy conservation techniques as needed, progress towards remaining STG's, consider low range theraband HEP    Consulted and Agree with Plan  of Care Patient             Patient will benefit from skilled therapeutic intervention in order to improve the following deficits and impairments:  Body Structure / Function / Physical Skills: ADL, Balance, Mobility, Strength, Flexibility, UE functional use, FMC, Coordination, Gait, ROM, GMC, Decreased knowledge of precautions, Decreased knowledge of use of DME, Dexterity, IADL       Visit Diagnosis: Muscle weakness (generalized)  Unsteadiness on feet    Problem List Patient Active Problem List   Diagnosis Date Noted   Multiple sclerosis ([place]) 03/08/2021   Hypersomnia 01/12/2021   High risk medication use 01/06/2020   Vitamin D deficiency 01/06/2020   Normocytic anemia 09/26/2016   Hematuria    Urinary retention    Slow transit constipation    Yeast infection    Acute lower UTI    Other complicated headache syndrome    Acute blood loss anemia    Reactive hypertension    Multiple sclerosis exacerbation ([facility]) 04/24/2016   Neurogenic bladder 04/24/2016   Weakness of both arms    Weakness of both legs    Sepsis ([place]) 04/21/2016   Bilateral hydronephrosis 04/21/2016   AKI (acute kidney injury) ([place]) 04/21/2016   Sepsis secondary to UTI ([place]) 04/20/2016   Attention deficit disorder 02/23/2016   Abnormality of gait 11/24/2015   Generalized weakness 11/24/2015   Urinary urgency 11/24/2015   UTI (lower urinary tract infection) 10/29/2015   Leukocytosis 10/29/2015   Nausea and vomiting 10/29/2015   MS (multiple sclerosis) ([place]) 10/26/2014    Carey Bullocks, OTR/L 04/17/2021, 1:29 PM  [facility] [address] [place], [place], [address] Phone: [phone]   Fax:  [phone]  Name: Crystal Park MRN: [id number] Date of Birth: 1960-12-29

## 2021-04-19 ENCOUNTER — Ambulatory Visit: Payer: Managed Care, Other (non HMO) | Admitting: Occupational Therapy

## 2021-04-19 ENCOUNTER — Encounter: Payer: Self-pay | Admitting: Occupational Therapy

## 2021-04-19 ENCOUNTER — Ambulatory Visit: Payer: Managed Care, Other (non HMO) | Admitting: Physical Therapy

## 2021-04-19 ENCOUNTER — Other Ambulatory Visit: Payer: Self-pay

## 2021-04-19 DIAGNOSIS — R2689 Other abnormalities of gait and mobility: Secondary | ICD-10-CM

## 2021-04-19 DIAGNOSIS — G35 Multiple sclerosis: Secondary | ICD-10-CM

## 2021-04-19 DIAGNOSIS — R2681 Unsteadiness on feet: Secondary | ICD-10-CM

## 2021-04-19 DIAGNOSIS — M6281 Muscle weakness (generalized): Secondary | ICD-10-CM

## 2021-04-19 DIAGNOSIS — R29818 Other symptoms and signs involving the nervous system: Secondary | ICD-10-CM

## 2021-04-19 NOTE — Therapy (Signed)
[facility] [address] [place], [place], [address] Phone: [phone]   Fax:  [phone]  Occupational Therapy Treatment  Patient Details  Name: Crystal Park MRN: [id number] Date of Birth: [birth year] Referring Provider (OT): Dr. Dagoberto Ligas   Encounter Date: 04/19/2021   OT End of Session - 04/19/21 1020     Visit Number 3    Number of Visits 25    Date for OT Re-Evaluation 06/27/21    Authorization Type [employer]    Authorization Time Period 12 weeks    Authorization - Visit Number 3    Authorization - Number of Visits 75   days, combined   OT Start Time 1019    OT Stop Time 1100    OT Time Calculation (min) 41 min    Activity Tolerance Patient tolerated treatment well    Behavior During Therapy [facility] for tasks assessed/performed             Past Medical History:  Diagnosis Date   Benign paroxysmal positional vertigo 10/12/2014   Constipation    Family history of breast cancer    Headache    history of migaines   History of kidney stones    History of MRSA infection    History of normocytic normochromic anemia    MS (multiple sclerosis) ([place])    diagnosed 9/16   Neurogenic bladder    self-caths   PONV (postoperative nausea and vomiting)     Past Surgical History:  Procedure Laterality Date   BREAST LUMPECTOMY Right    benign   COLONOSCOPY     CYSTOSCOPY WITH INJECTION N/A 04/23/2017   Procedure: CYSTOSCOPY WITH INJECTION/ BOTOX 200 UNITS;  Surgeon: Festus Aloe, MD;  Location: [facility];  Service: Urology;  Laterality: N/A;   OOPHORECTOMY Left    2013   TONSILLECTOMY AND ADENOIDECTOMY     age 61   TRANSURETHRAL RESECTION OF BLADDER TUMOR N/A 04/23/2017   Procedure: TRANSURETHRAL RESECTION OF BLADDER TUMOR (TURBT);  Surgeon: Festus Aloe, MD;  Location: [facility];  Service: Urology;  Laterality: N/A;    There were no vitals filed for this visit.    Subjective Assessment - 04/19/21 1020     Subjective  Pt denies any pain or changes.    Pertinent History MS, sepsis due to UTI, hx of vertigo    Patient Stated Goals improve RUE functional use    Currently in Pain? No/denies    Pain Score 0-No pain             Reviewed Cane Exercises.  Reviewed Energy Conservation Techniques.  Yellow Theraband HEP. See Pt instructions.  Resistance Clothespins 1-8# and putting them on the antenna with RUE and removing and placing back down with LUE for endurance ,sustained pinch and strengthening.                      OT Education - 04/19/21 1035     Education Details [employer] Access Code: [email or web address]    Person(s) Educated Patient    Methods Explanation;Demonstration;Handout;Verbal cues    Comprehension Verbalized understanding;Returned demonstration              OT Short Term Goals - 04/17/21 1326       OT SHORT TERM GOAL #1   Title Pt will be independent with initial HEP    Time 4    Period Weeks    Status On-going    Target Date  05/02/21      OT SHORT TERM GOAL #2   Title Pt will stand x 10 mins for ADL/functional tasks prior to rest break.    Baseline 5 mins    Time 4    Period Weeks    Status New      OT SHORT TERM GOAL #3   Title Pt will verbalize understanding of energy conservation strategies.    Time 4    Period Weeks    Status On-going      OT SHORT TERM GOAL #4   Title Pt will verbalize understanding of adapted strategies/ AE to maximize safety and I with ADLs/ IADLs .    Time 4    Period Weeks    Status New      OT SHORT TERM GOAL #5   Title Pt will retrieve a lightweight object at 125* shoulder flexion with RUE for increased functional use.    Baseline 120 shoulder flexion    Time 4    Period Weeks    Status New               OT Long Term Goals - 04/04/21 1238       OT LONG TERM GOAL #1   Title Pt will be independent with updated HEP.-    Time 12    Period Weeks     Target Date 06/27/21      OT LONG TERM GOAL #2   Title Pt will load the washing machine modified independently    Time 12    Period Weeks    Status New      OT LONG TERM GOAL #3   Title Pt will perfrom simple snack prep/ microwave cooking modified independently.    Time 12    Period Weeks    Status New      OT LONG TERM GOAL #4   Title Pt will improve R hand strength  to at least 40 lbs for increased ease with opening containers.    Time 12    Period Weeks    Status New                   Plan - 04/19/21 1054     Clinical Impression Statement Pt fatigues quickly but is improving with strength and endurance. Pt with good carryover of energy conservation strategies.    OT Occupational Profile and History Problem Focused Assessment - Including review of records relating to presenting problem    Occupational performance deficits (Please refer to evaluation for details): ADL's;IADL's;Play;Leisure;Social Participation    Body Structure / Function / Physical Skills ADL;Balance;Mobility;Strength;Flexibility;UE functional use;FMC;Coordination;Gait;ROM;GMC;Decreased knowledge of precautions;Decreased knowledge of use of DME;Dexterity;IADL    Rehab Potential Good    Clinical Decision Making Limited treatment options, no task modification necessary    Comorbidities Affecting Occupational Performance: May have comorbidities impacting occupational performance    Modification or Assistance to Complete Evaluation  No modification of tasks or assist necessary to complete eval    OT Frequency 2x / week   anticipate d/c after * weeks however 12 week POC written to account for scheduling   OT Duration 12 weeks    OT Treatment/Interventions Self-care/ADL training;Therapeutic exercise;Balance training;Functional Mobility Training;Ultrasound;Aquatic Therapy;Neuromuscular education;Manual Therapy;Therapeutic activities;Cryotherapy;Paraffin;DME and/or AE instruction;Fluidtherapy;Patient/family  education;Passive range of motion;Moist Heat    Plan progress towards remaining STG's, review low range theraband HEP, arm bike    Consulted and Agree with Plan of Care Patient  Patient will benefit from skilled therapeutic intervention in order to improve the following deficits and impairments:   Body Structure / Function / Physical Skills: ADL, Balance, Mobility, Strength, Flexibility, UE functional use, FMC, Coordination, Gait, ROM, GMC, Decreased knowledge of precautions, Decreased knowledge of use of DME, Dexterity, IADL       Visit Diagnosis: Muscle weakness (generalized)  Other symptoms and signs involving the nervous system  Unsteadiness on feet  Other abnormalities of gait and mobility    Problem List Patient Active Problem List   Diagnosis Date Noted   Multiple sclerosis ([place]) 03/08/2021   Hypersomnia 01/12/2021   High risk medication use 01/06/2020   Vitamin D deficiency 01/06/2020   Normocytic anemia 09/26/2016   Hematuria    Urinary retention    Slow transit constipation    Yeast infection    Acute lower UTI    Other complicated headache syndrome    Acute blood loss anemia    Reactive hypertension    Multiple sclerosis exacerbation ([facility]) 04/24/2016   Neurogenic bladder 04/24/2016   Weakness of both arms    Weakness of both legs    Sepsis ([place]) 04/21/2016   Bilateral hydronephrosis 04/21/2016   AKI (acute kidney injury) ([place]) 04/21/2016   Sepsis secondary to UTI ([place]) 04/20/2016   Attention deficit disorder 02/23/2016   Abnormality of gait 11/24/2015   Generalized weakness 11/24/2015   Urinary urgency 11/24/2015   UTI (lower urinary tract infection) 10/29/2015   Leukocytosis 10/29/2015   Nausea and vomiting 10/29/2015   MS (multiple sclerosis) ([place]) 10/26/2014    Zachery Conch, OT 04/19/2021, 11:04 AM  [facility] [address] [place], [place], [address] Phone:  [phone]   Fax:  [phone]  Name: LEXIANN ZOPFI MRN: [id number] Date of Birth: [birth year]

## 2021-04-19 NOTE — Therapy (Signed)
[facility] [address] [place], [place], [address] Phone: [phone]   Fax:  [phone]  Physical Therapy Treatment  Patient Details  Name: Crystal Park MRN: [id number] Date of Birth: [birth year] Referring Provider (PT): Courtney Heys, MD   Encounter Date: 04/19/2021   PT End of Session - 04/19/21 1026     Visit Number 3    Number of Visits 17    Date for PT Re-Evaluation 05/30/21    Authorization Type [employer], VL combined with PT & OT: 75 days,    Authorization - Visit Number 2    Authorization - Number of Visits 75    PT Start Time 1100    PT Stop Time 1145    PT Time Calculation (min) 45 min    Equipment Utilized During Treatment Gait belt    Activity Tolerance Patient tolerated treatment well    Behavior During Therapy [facility] for tasks assessed/performed             Past Medical History:  Diagnosis Date   Benign paroxysmal positional vertigo 10/12/2014   Constipation    Family history of breast cancer    Headache    history of migaines   History of kidney stones    History of MRSA infection    History of normocytic normochromic anemia    MS (multiple sclerosis) ([place])    diagnosed 9/16   Neurogenic bladder    self-caths   PONV (postoperative nausea and vomiting)     Past Surgical History:  Procedure Laterality Date   BREAST LUMPECTOMY Right    benign   COLONOSCOPY     CYSTOSCOPY WITH INJECTION N/A 04/23/2017   Procedure: CYSTOSCOPY WITH INJECTION/ BOTOX 200 UNITS;  Surgeon: Festus Aloe, MD;  Location: [place];  Service: Urology;  Laterality: N/A;   OOPHORECTOMY Left    2013   TONSILLECTOMY AND ADENOIDECTOMY     age 58   TRANSURETHRAL RESECTION OF BLADDER TUMOR N/A 04/23/2017   Procedure: TRANSURETHRAL RESECTION OF BLADDER TUMOR (TURBT);  Surgeon: Festus Aloe, MD;  Location: [facility];  Service: Urology;  Laterality: N/A;    There were no  vitals filed for this visit.   Subjective Assessment - 04/19/21 1056     Subjective Pt reports no other falls. Pt requests to review exercises again today.    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    Patient Stated Goals Wants to be able to walk better.                [facility] PT Assessment - 04/19/21 1026       Assessment   Medical Diagnosis G35 (ICD-10-CM) - Multiple sclerosis    Referring Provider (PT) Courtney Heys, MD    Onset Date/Surgical Date 01/12/21                           [facility] Adult PT Treatment/Exercise - 04/19/21 0001       Knee/Hip Exercises: Aerobic   Nustep L2 x 4 min LEs only      Knee/Hip Exercises: Standing   Other Standing Knee Exercises Marching x 10      Knee/Hip Exercises: Seated  Long Arc Quad Strengthening;Left;Right;10 reps    Ball Squeeze x10    Clamshell with TheraBand Yellow   2x10   Marching Strengthening;Right;Left;10 reps    Marching Limitations yellow tband    Sit to [employer] 5 reps                 Balance Exercises - 04/19/21 0001       Balance Exercises: Standing   Standing Eyes Opened Narrow base of support (BOS);Limitations;Solid surface    Standing Eyes Opened Limitations x30 sec static; head turns and head nods x10 each direction    Standing Eyes Closed Solid surface;Limitations    Standing Eyes Closed Limitations With feet hip width 3 x 15 seconds    Standing, One Foot on a Step Eyes open;2 inch    Standing, One Foot on a Step Limitations 2x15 sec                  PT Short Term Goals - 04/04/21 1037       PT SHORT TERM GOAL #1   Title Pt will be independent with initial HEP with family supervision.     Time 4    Period Weeks    Status New    Target Date 05/02/21      PT SHORT TERM GOAL #2   Title Pt will demo Berg Balance Score of 40/56 to demo return to prior level from before her hospitalization    Baseline 33/56 on 04/04/21    Time 4    Period Weeks    Status New    Target Date 05/02/21      PT SHORT TERM GOAL #3   Title Pt will improve 5x sit <> stand using UE support from chair to 25 seconds or less in order to demo improved functional BLE strength/decr fall risk.    Baseline 35.17 sec on 04/04/21    Time 4    Period Weeks    Status New    Target Date 05/02/21      PT SHORT TERM GOAL #4   Title Pt will improve gait speed with rollator to at least 1.8 ft/sec in order to demo decr fall risk.    Baseline 1.46 ft/sec with rollator.    Time 4    Period Weeks    Status New      PT SHORT TERM GOAL #5   Title Pt will be able to tolerate ambulating at least 6 min without rest    Baseline Only tolerating 4 min 04/04/21 per pt report    Time 4    Period Weeks    Status New    Target Date 05/02/21               PT Long Term Goals - 04/04/21 1040       PT LONG TERM GOAL #1   Title Pt will be independent with final HEP with family supervision in order to build upon functional gains made in therapy.    Time 8    Period Weeks    Status New    Target Date 05/30/21      PT LONG TERM GOAL #2   Title Pt will improve BERG to at least a 46/56 in order to demo decr fall risk.    Baseline 33/56    Time 8    Period Weeks    Status New    Target Date 05/30/21      PT LONG TERM GOAL #3  Title Pt will improve 30 second chair stand to at least 7 sit <> stands using BUE support in order to demo improved strength/endurance.    Baseline 4 sit <> Stands on 04/04/21    Time 8    Period Weeks    Status New    Target Date 05/30/21      PT LONG TERM GOAL #4   Title Pt will perform TUG in 24 seconds or less with rollator vs. LRAD in order to demo decr fall risk.    Baseline 29.27  with rollator on 04/04/21    Time 8    Period Weeks    Status New    Target Date 05/30/21      PT LONG TERM GOAL #5   Title Pt will improve gait speed with rollator to at least 2.1 ft/sec in order to demo decr fall risk/improved efficiency with gait.    Baseline 1.42 ft/sec    Time 8    Period Weeks    Status New    Target Date 05/30/21      Additional Long Term Goals   Additional Long Term Goals Yes      PT LONG TERM GOAL #6   Title Pt will increase 2 MWT distance to at least 239' to demo [facility] for MS population    Baseline 176' on 04/04/21    Time 8    Period Weeks    Status New    Target Date 05/30/21                   Plan - 04/19/21 1127     Clinical Impression Statement Pt appears more fatigued this session. Reviewed HEP per her request. Primarily worked on strengthening in seated. Continued to work on balance -- 4" step appears too hard for pt, went down to 2" step with better tolerance. Pt reports finding feet together with head turns very difficult. Requires intermittent rest breaks. HR 90s-110s after exercises. SpO2 stable.    Personal Factors and Comorbidities Comorbidity 1;Time since onset of injury/illness/exacerbation;Behavior Pattern;Past/Current Experience    Comorbidities PMH: RRMS (diagnosed 10/2014)    Examination-Activity Limitations Carry;Lift;Locomotion Level;Squat;Stairs;Transfers;Stand;Reach Overhead    Examination-Participation Restrictions Cleaning;Community Activity;Laundry    Stability/Clinical Decision Making Evolving/Moderate complexity    Rehab Potential Good    PT Frequency 2x / week    PT Duration 8 weeks    PT Treatment/Interventions ADLs/Self Care Home Management;Aquatic Therapy;DME Instruction;Stair training;Functional mobility training;Gait training;Balance training;Therapeutic activities;Neuromuscular re-education;Therapeutic exercise;Patient/family education;Passive range of motion;Vestibular;Manual techniques;Energy conservation;Taping     PT Next Visit Plan how is HEP? NuStep. Work on Nutritional therapist.  Work on Advertising account executive.    PT Home Exercise Plan Access Code: [email or web address]; Currently has walking program from acute care PT and upper body exercises    Consulted and Agree with Plan of Care Patient             Patient will benefit from skilled therapeutic intervention in order to improve the following deficits and impairments:  Abnormal gait, Decreased balance, Decreased activity tolerance, Decreased coordination, Decreased endurance, Decreased knowledge of use of DME, Decreased mobility, Decreased strength, Difficulty walking, Postural dysfunction, Pain  Visit Diagnosis: Muscle weakness (generalized)  Unsteadiness on feet  Other symptoms and signs involving the nervous system  Other abnormalities of gait and mobility  Multiple sclerosis [facility])     Problem List Patient Active Problem List   Diagnosis Date Noted   Multiple sclerosis ([place]) 03/08/2021   Hypersomnia 01/12/2021   High risk medication  use 01/06/2020   Vitamin D deficiency 01/06/2020   Normocytic anemia 09/26/2016   Hematuria    Urinary retention    Slow transit constipation    Yeast infection    Acute lower UTI    Other complicated headache syndrome    Acute blood loss anemia    Reactive hypertension    Multiple sclerosis exacerbation ([place]) 04/24/2016   Neurogenic bladder 04/24/2016   Weakness of both arms    Weakness of both legs    Sepsis ([place]) 04/21/2016   Bilateral hydronephrosis 04/21/2016   AKI (acute kidney injury) ([place]) 04/21/2016   Sepsis secondary to UTI ([place]) 04/20/2016   Attention deficit disorder 02/23/2016   Abnormality of gait 11/24/2015   Generalized weakness 11/24/2015   Urinary urgency 11/24/2015   UTI (lower urinary tract infection) 10/29/2015   Leukocytosis 10/29/2015   Nausea and vomiting 10/29/2015   MS (multiple sclerosis) [facility]) 10/26/2014    Moriya Mitchell April Gordy Levan, PT, DPT 04/19/2021, 11:45 AM  [place] [address] [place], [place], [address] Phone: [phone]   Fax:  [phone]  Name: Crystal Park MRN: [id number] Date of Birth: [birth year]

## 2021-04-19 NOTE — Patient Instructions (Signed)
Access Code [email or web address]  Access Code: [email or web address] URL: [email or web address] Date: 04/19/2021 Prepared by: Kallie Edward  Exercises Seated Shoulder Row with Resistance Anchored at Feet - 1 x daily - 7 x weekly - 3 sets - 10 reps Seated Shoulder Horizontal Abduction with Resistance - 1 x daily - 7 x weekly - 3 sets - 10 reps Seated Shoulder Front Raise with Resistance - 1 x daily - 7 x weekly - 3 sets - 10 reps Seated Shoulder Extension with Resistance - 1 x daily - 7 x weekly - 3 sets - 10 reps Alternating Punch with Resistance - 1 x daily - 7 x weekly - 3 sets - 10 reps Standing Single Arm Elbow Flexion with Resistance - 1 x daily - 7 x weekly - 3 sets - 10 reps

## 2021-04-25 ENCOUNTER — Ambulatory Visit: Payer: Managed Care, Other (non HMO) | Admitting: Physical Therapy

## 2021-04-25 ENCOUNTER — Ambulatory Visit: Payer: Managed Care, Other (non HMO) | Admitting: Occupational Therapy

## 2021-04-25 ENCOUNTER — Other Ambulatory Visit: Payer: Self-pay

## 2021-04-25 ENCOUNTER — Encounter: Payer: Self-pay | Admitting: Physical Therapy

## 2021-04-25 DIAGNOSIS — M6281 Muscle weakness (generalized): Secondary | ICD-10-CM

## 2021-04-25 DIAGNOSIS — R2681 Unsteadiness on feet: Secondary | ICD-10-CM

## 2021-04-25 DIAGNOSIS — R2689 Other abnormalities of gait and mobility: Secondary | ICD-10-CM

## 2021-04-25 DIAGNOSIS — R29818 Other symptoms and signs involving the nervous system: Secondary | ICD-10-CM

## 2021-04-25 NOTE — Therapy (Addendum)
[facility] [address] [place], [place], [address] Phone: [phone]   Fax:  [phone]  Physical Therapy Treatment  Patient Details  Name: Crystal Park MRN: [id number] Date of Birth: 08/24/1960 Referring Provider (PT): Genice Rouge, MD   Encounter Date: 04/25/2021   PT End of Session - 04/25/21 1016     Visit Number 4    Number of Visits 17    Date for PT Re-Evaluation 05/30/21    Authorization Type [employer], VL combined with PT & OT: 75 days,    Authorization - Visit Number 2    Authorization - Number of Visits 75    PT Start Time 1016    PT Stop Time 1059    PT Time Calculation (min) 43 min    Equipment Utilized During Treatment Gait belt    Activity Tolerance Patient tolerated treatment well    Behavior During Therapy WFL for tasks assessed/performed             Past Medical History:  Diagnosis Date   Benign paroxysmal positional vertigo 10/12/2014   Constipation    Family history of breast cancer    Headache    history of migaines   History of kidney stones    History of MRSA infection    History of normocytic normochromic anemia    MS (multiple sclerosis) ([facility])    diagnosed 9/16   Neurogenic bladder    self-caths   PONV (postoperative nausea and vomiting)     Past Surgical History:  Procedure Laterality Date   BREAST LUMPECTOMY Right    benign   COLONOSCOPY     CYSTOSCOPY WITH INJECTION N/A 04/23/2017   Procedure: CYSTOSCOPY WITH INJECTION/ BOTOX 200 UNITS;  Surgeon: Jerilee Field, MD;  Location: [facility];  Service: Urology;  Laterality: N/A;   OOPHORECTOMY Left    2013   TONSILLECTOMY AND ADENOIDECTOMY     age 43   TRANSURETHRAL RESECTION OF BLADDER TUMOR N/A 04/23/2017   Procedure: TRANSURETHRAL RESECTION OF BLADDER TUMOR (TURBT);  Surgeon: Jerilee Field, MD;  Location: [facility];  Service: Urology;  Laterality: N/A;    There were no  vitals filed for this visit.   Subjective Assessment - 04/25/21 1019     Subjective Pt states she is very fatigued today as she usually does not sleep well.  She was unable to do HEP yesterday due to feeling too tired.  Pt rates fatigue 4/10 at onset of session.    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    Patient Stated Goals Wants to be able to walk better.    Currently in Pain? Yes    Pain Score 4     Pain Location Knee    Pain Orientation Right    Pain Descriptors / Indicators Sore    Pain Type Chronic pain    Pain Onset 1 to 4 weeks ago                               [facility] Adult PT Treatment/Exercise - 04/25/21 1025       Transfers   Transfers Sit to Stand;Stand to [employer]  Sit to Stand 5: Supervision    Sit to Stand Details Verbal cues for technique;Verbal cues for sequencing    Sit to Stand Details (indicate cue type and reason) 2x5; Cuing for forward weight shift before standing, cued for shoulders back and pelvis forward once standing; fatigue 6/10 following    Stand to Sit 5: Supervision    Stand to Sit Details (indicate cue type and reason) Verbal cues for technique;Verbal cues for sequencing    Stand to Sit Details Cues for forward lean to engage gluts during descent      Ambulation/Gait   Ambulation/Gait Yes    Ambulation/Gait Assistance 5: Supervision    Ambulation/Gait Assistance Details 3 total laps (345'); 2 laps followed by rest and 1 additional lap in gym; HR 112 bpm, SpO2 98% following initial 2 laps w/ emphasis on step length and heel strike especially on the right, last lap focused on changing gait speed with emphasis on stride length    Ambulation Distance  (Feet) 230 Feet    Assistive device Rollator    Gait Pattern Step-through pattern;Decreased stride length;Decreased dorsiflexion - right;Decreased dorsiflexion - left    Ambulation Surface Level;Indoor      Neuro Re-ed    Neuro Re-ed Details  Pt performs standing at [facility] with feet wide 1x30sec, feet together 2x30 sec.  Pt demo minimal sway with feet together, but retains guarded posture, cued for relaxed arms and dropped shoulders to promote ankle strategy.  Standing feet together using gold weighted ball performing UE D1/D2 patterns 2x10 w/ 1 rest between sets.      Exercises   Exercises Other Exercises    Other Exercises  NuStep level 2, 6 mins, avg steps per min 47, pt requires 1 rest break following, HR following activity 104 bpm, 96% spO2                     PT Education - 04/25/21 1048     Education Details Pt edu on fatigue monitoring during exercise at home to conserve energy for ADL management and maintain consistent workload.    Person(s) Educated Patient    Methods Explanation    Comprehension Verbalized understanding              PT Short Term Goals - 04/04/21 1037       PT SHORT TERM GOAL #1   Title Pt will be independent with initial HEP with family supervision.    Time 4    Period Weeks    Status New    Target Date 05/02/21      PT SHORT TERM GOAL #2   Title Pt will demo Berg Balance Score of 40/56 to demo return to prior level from before her hospitalization    Baseline 33/56 on 04/04/21    Time 4    Period Weeks    Status New    Target Date 05/02/21      PT SHORT TERM GOAL #3   Title Pt will improve 5x sit <> stand using UE support from chair to 25 seconds or less in order to demo improved functional BLE strength/decr fall risk.    Baseline 35.17 sec on 04/04/21    Time 4    Period Weeks    Status New    Target Date 05/02/21      PT SHORT TERM GOAL #4   Title Pt will improve gait speed with rollator to at least 1.8 ft/sec in order to demo  decr  fall risk.    Baseline 1.46 ft/sec with rollator.    Time 4    Period Weeks    Status New      PT SHORT TERM GOAL #5   Title Pt will be able to tolerate ambulating at least 6 min without rest    Baseline Only tolerating 4 min 04/04/21 per pt report    Time 4    Period Weeks    Status New    Target Date 05/02/21               PT Long Term Goals - 04/04/21 1040       PT LONG TERM GOAL #1   Title Pt will be independent with final HEP with family supervision in order to build upon functional gains made in therapy.    Time 8    Period Weeks    Status New    Target Date 05/30/21      PT LONG TERM GOAL #2   Title Pt will improve BERG to at least a 46/56 in order to demo decr fall risk.    Baseline 33/56    Time 8    Period Weeks    Status New    Target Date 05/30/21      PT LONG TERM GOAL #3   Title Pt will improve 30 second chair stand to at least 7 sit <> stands using BUE support in order to demo improved strength/endurance.    Baseline 4 sit <> Stands on 04/04/21    Time 8    Period Weeks    Status New    Target Date 05/30/21      PT LONG TERM GOAL #4   Title Pt will perform TUG in 24 seconds or less with rollator vs. LRAD in order to demo decr fall risk.    Baseline 29.27 with rollator on 04/04/21    Time 8    Period Weeks    Status New    Target Date 05/30/21      PT LONG TERM GOAL #5   Title Pt will improve gait speed with rollator to at least 2.1 ft/sec in order to demo decr fall risk/improved efficiency with gait.    Baseline 1.42 ft/sec    Time 8    Period Weeks    Status New    Target Date 05/30/21      Additional Long Term Goals   Additional Long Term Goals Yes      PT LONG TERM GOAL #6   Title Pt will increase 2 MWT distance to at least 239' to demo [facility] for MS population    Baseline 176' on 04/04/21    Time 8    Period Weeks    Status New    Target Date 05/30/21                   Plan - 04/25/21 1117     Clinical Impression  Statement Pt remains limited by fatigue throughout session requiring frequent rest breaks.  Able to progress to more aerobic focus during session with standing activity for endurance and balance.  Pt demonstrates guarded during balance tasks, HR 104-110s and SpO2 stable following activity.  Fatigue at end of session 6/10.  HEP maintained as pt still feels it is appropriately challenging.    Personal Factors and Comorbidities Comorbidity 1;Time since onset of injury/illness/exacerbation;Behavior Pattern;Past/Current Experience    Comorbidities PMH: RRMS (diagnosed 10/2014)    Examination-Activity Limitations Carry;Lift;Locomotion Level;Squat;Stairs;Transfers;Stand;Reach  Overhead    Examination-Participation Restrictions Cleaning;Community Activity;Laundry    Stability/Clinical Decision Making Evolving/Moderate complexity    Rehab Potential Good    PT Frequency 2x / week    PT Duration 8 weeks    PT Treatment/Interventions ADLs/Self Care Home Management;Aquatic Therapy;DME Instruction;Stair training;Functional mobility training;Gait training;Balance training;Therapeutic activities;Neuromuscular re-education;Therapeutic exercise;Patient/family education;Passive range of motion;Vestibular;Manual techniques;Energy conservation;Taping    PT Next Visit Plan Progress HEP to standing activity if able.  NuStep. Work on Nutritional therapist.  Work on Advertising account executive.    PT Home Exercise Plan Access Code: [email or web address]; Currently has walking program from acute care PT and upper body exercises    Consulted and Agree with Plan of Care Patient             Patient will benefit from skilled therapeutic intervention in order to improve the following deficits and impairments:  Abnormal gait, Decreased balance, Decreased activity tolerance, Decreased coordination, Decreased endurance, Decreased knowledge of use of DME, Decreased mobility, Decreased strength, Difficulty walking, Postural dysfunction, Pain  Visit  Diagnosis: Muscle weakness (generalized)  Unsteadiness on feet  Other abnormalities of gait and mobility     Problem List Patient Active Problem List   Diagnosis Date Noted   Multiple sclerosis ([place]) 03/08/2021   Hypersomnia 01/12/2021   High risk medication use 01/06/2020   Vitamin D deficiency 01/06/2020   Normocytic anemia 09/26/2016   Hematuria    Urinary retention    Slow transit constipation    Yeast infection    Acute lower UTI    Other complicated headache syndrome    Acute blood loss anemia    Reactive hypertension    Multiple sclerosis exacerbation ([place]) 04/24/2016   Neurogenic bladder 04/24/2016   Weakness of both arms    Weakness of both legs    Sepsis ([place]) 04/21/2016   Bilateral hydronephrosis 04/21/2016   AKI (acute kidney injury) ([place]) 04/21/2016   Sepsis secondary to UTI ([place]) 04/20/2016   Attention deficit disorder 02/23/2016   Abnormality of gait 11/24/2015   Generalized weakness 11/24/2015   Urinary urgency 11/24/2015   UTI (lower urinary tract infection) 10/29/2015   Leukocytosis 10/29/2015   Nausea and vomiting 10/29/2015   MS (multiple sclerosis) ([place]) 10/26/2014    Elease Etienne, PT, DPT 04/25/2021, 3:31 PM  [place] [address] [place], [place], [address] Phone: [phone]   Fax:  [phone]  Name: TIEGAN KATAYAMA MRN: [id number] Date of Birth: November 13, 1960

## 2021-04-25 NOTE — Therapy (Signed)
[facility] [address] [place], [place], [address] Phone: [phone]   Fax:  [phone]  Occupational Therapy Treatment  Patient Details  Name: Crystal Park MRN: [id number] Date of Birth: 15-Aug-1960 Referring Provider (OT): Dr. Dagoberto Ligas   Encounter Date: 04/25/2021   OT End of Session - 04/25/21 1130     Visit Number 4    Number of Visits 25    Date for OT Re-Evaluation 06/27/21    Authorization Type [employer]    Authorization Time Period 12 weeks    Authorization - Visit Number 4    Authorization - Number of Visits 75   days, combined   OT Start Time 1100    OT Stop Time 1135    OT Time Calculation (min) 35 min    Activity Tolerance Patient tolerated treatment well    Behavior During Therapy [facility] for tasks assessed/performed             Past Medical History:  Diagnosis Date   Benign paroxysmal positional vertigo 10/12/2014   Constipation    Family history of breast cancer    Headache    history of migaines   History of kidney stones    History of MRSA infection    History of normocytic normochromic anemia    MS (multiple sclerosis) ([place])    diagnosed 9/16   Neurogenic bladder    self-caths   PONV (postoperative nausea and vomiting)     Past Surgical History:  Procedure Laterality Date   BREAST LUMPECTOMY Right    benign   COLONOSCOPY     CYSTOSCOPY WITH INJECTION N/A 04/23/2017   Procedure: CYSTOSCOPY WITH INJECTION/ BOTOX 200 UNITS;  Surgeon: Festus Aloe, MD;  Location: [facility];  Service: Urology;  Laterality: N/A;   OOPHORECTOMY Left    2013   TONSILLECTOMY AND ADENOIDECTOMY     age 29   TRANSURETHRAL RESECTION OF BLADDER TUMOR N/A 04/23/2017   Procedure: TRANSURETHRAL RESECTION OF BLADDER TUMOR (TURBT);  Surgeon: Festus Aloe, MD;  Location: [facility];  Service: Urology;  Laterality: N/A;    There were no vitals filed for this visit.    Subjective Assessment - 04/25/21 1101     Subjective  My Rt knee is a little sore    Pertinent History MS, sepsis due to UTI, hx of vertigo    Patient Stated Goals improve RUE functional use    Currently in Pain? Yes    Pain Score 6     Pain Location Knee    Pain Orientation Right    Pain Descriptors / Indicators Sore    Pain Type Chronic pain    Pain Onset 1 to 4 weeks ago    Pain Frequency Intermittent    Aggravating Factors  overuse    Pain Relieving Factors unknown              Reviewed theraband HEP - pt demo each x 10 reps  Standing to retrieve cones from lower shelf and then replaced on higher shelf RUE at approx 120-125*. Pt stood for approx 2-3 minutes before needing to sit.   UBE x 5 minutes, level 1.   Pt was very fatigued today after P.T. session, therefore ended O.T. session 10 minutes early                      OT Short Term Goals - 04/25/21 1131       OT SHORT  TERM GOAL #1   Title Pt will be independent with initial HEP    Time 4    Period Weeks    Status Achieved    Target Date 05/02/21      OT SHORT TERM GOAL #2   Title Pt will stand x 7 mins for ADL/functional tasks prior to rest break.    Baseline up to 5 minutes    Time 4    Period Weeks    Status On-going      OT SHORT TERM GOAL #3   Title Pt will verbalize understanding of energy conservation strategies.    Time 4    Period Weeks    Status Achieved      OT SHORT TERM GOAL #4   Title Pt will verbalize understanding of adapted strategies/ AE to maximize safety and I with ADLs/ IADLs .    Time 4    Period Weeks    Status On-going      OT SHORT TERM GOAL #5   Title Pt will retrieve a lightweight object at 125* shoulder flexion with RUE for increased functional use.    Baseline 120 shoulder flexion    Time 4    Period Weeks    Status On-going   Pt placing cones on high shelf in clinic requiring 120-125* sh flex              OT Long Term Goals - 04/04/21 1238        OT LONG TERM GOAL #1   Title Pt will be independent with updated HEP.-    Time 12    Period Weeks    Target Date 06/27/21      OT LONG TERM GOAL #2   Title Pt will load the washing machine modified independently    Time 12    Period Weeks    Status New      OT LONG TERM GOAL #3   Title Pt will perfrom simple snack prep/ microwave cooking modified independently.    Time 12    Period Weeks    Status New      OT LONG TERM GOAL #4   Title Pt will improve R hand strength  to at least 40 lbs for increased ease with opening containers.    Time 12    Period Weeks    Status New                   Plan - 04/25/21 1132     Clinical Impression Statement Pt fatigues quickly but is improving with strength and endurance. Pt required rest breaks frequently w/ O.T. however she was up walking quite a bit with P.T. prior to O.T. session    OT Occupational Profile and History Problem Focused Assessment - Including review of records relating to presenting problem    Occupational performance deficits (Please refer to evaluation for details): ADL's;IADL's;Play;Leisure;Social Participation    Body Structure / Function / Physical Skills ADL;Balance;Mobility;Strength;Flexibility;UE functional use;FMC;Coordination;Gait;ROM;GMC;Decreased knowledge of precautions;Decreased knowledge of use of DME;Dexterity;IADL    Rehab Potential Good    Clinical Decision Making Limited treatment options, no task modification necessary    Comorbidities Affecting Occupational Performance: May have comorbidities impacting occupational performance    Modification or Assistance to Complete Evaluation  No modification of tasks or assist necessary to complete eval    OT Frequency 2x / week   anticipate d/c after * weeks however 12 week POC written to account for scheduling  OT Duration 12 weeks    OT Treatment/Interventions Self-care/ADL training;Therapeutic exercise;Balance training;Functional Mobility  Training;Ultrasound;Aquatic Therapy;Neuromuscular education;Manual Therapy;Therapeutic activities;Cryotherapy;Paraffin;DME and/or AE instruction;Fluidtherapy;Patient/family education;Passive range of motion;Moist Heat    Plan progress towards remaining STG's, continue functional reaching and standing tolerance, arm bike    Consulted and Agree with Plan of Care Patient             Patient will benefit from skilled therapeutic intervention in order to improve the following deficits and impairments:   Body Structure / Function / Physical Skills: ADL, Balance, Mobility, Strength, Flexibility, UE functional use, FMC, Coordination, Gait, ROM, GMC, Decreased knowledge of precautions, Decreased knowledge of use of DME, Dexterity, IADL       Visit Diagnosis: Muscle weakness (generalized)  Other symptoms and signs involving the nervous system  Unsteadiness on feet    Problem List Patient Active Problem List   Diagnosis Date Noted   Multiple sclerosis ([place]) 03/08/2021   Hypersomnia 01/12/2021   High risk medication use 01/06/2020   Vitamin D deficiency 01/06/2020   Normocytic anemia 09/26/2016   Hematuria    Urinary retention    Slow transit constipation    Yeast infection    Acute lower UTI    Other complicated headache syndrome    Acute blood loss anemia    Reactive hypertension    Multiple sclerosis exacerbation ([facility]) 04/24/2016   Neurogenic bladder 04/24/2016   Weakness of both arms    Weakness of both legs    Sepsis ([place]) 04/21/2016   Bilateral hydronephrosis 04/21/2016   AKI (acute kidney injury) ([place]) 04/21/2016   Sepsis secondary to UTI ([place]) 04/20/2016   Attention deficit disorder 02/23/2016   Abnormality of gait 11/24/2015   Generalized weakness 11/24/2015   Urinary urgency 11/24/2015   UTI (lower urinary tract infection) 10/29/2015   Leukocytosis 10/29/2015   Nausea and vomiting 10/29/2015   MS (multiple sclerosis) ([place]) 10/26/2014    Carey Bullocks,  OTR/L 04/25/2021, 11:43 AM  [facility] [address] [place], [place], [address] Phone: [phone]   Fax:  [phone]  Name: Crystal Park MRN: [id number] Date of Birth: 1960/07/16

## 2021-04-28 ENCOUNTER — Ambulatory Visit: Payer: Managed Care, Other (non HMO) | Admitting: Physical Therapy

## 2021-04-28 ENCOUNTER — Other Ambulatory Visit: Payer: Self-pay

## 2021-04-28 ENCOUNTER — Encounter: Payer: Self-pay | Admitting: Occupational Therapy

## 2021-04-28 ENCOUNTER — Ambulatory Visit: Payer: Managed Care, Other (non HMO) | Admitting: Occupational Therapy

## 2021-04-28 DIAGNOSIS — R29818 Other symptoms and signs involving the nervous system: Secondary | ICD-10-CM

## 2021-04-28 DIAGNOSIS — R2689 Other abnormalities of gait and mobility: Secondary | ICD-10-CM

## 2021-04-28 DIAGNOSIS — R2681 Unsteadiness on feet: Secondary | ICD-10-CM | POA: Diagnosis not present

## 2021-04-28 DIAGNOSIS — M6281 Muscle weakness (generalized): Secondary | ICD-10-CM

## 2021-04-28 NOTE — Therapy (Signed)
[facility] [address] [place], [place], [address] Phone: [phone]   Fax:  [phone]  Physical Therapy Treatment  Patient Details  Name: Crystal Park MRN: [id number] Date of Birth: 1960-04-02 Referring Provider (PT): Courtney Heys, MD   Encounter Date: 04/28/2021   PT End of Session - 04/28/21 1211     Visit Number 5    Number of Visits 17    Date for PT Re-Evaluation 05/30/21    Authorization Type [employer], VL combined with PT & OT: 75 days,    Authorization - Visit Number 2    Authorization - Number of Visits 75    PT Start Time 0930    PT Stop Time 1012    PT Time Calculation (min) 42 min    Equipment Utilized During Treatment Gait belt    Activity Tolerance Patient tolerated treatment well;Patient limited by fatigue    Behavior During Therapy [facility] for tasks assessed/performed             Past Medical History:  Diagnosis Date   Benign paroxysmal positional vertigo 10/12/2014   Constipation    Family history of breast cancer    Headache    history of migaines   History of kidney stones    History of MRSA infection    History of normocytic normochromic anemia    MS (multiple sclerosis) ([place])    diagnosed 9/16   Neurogenic bladder    self-caths   PONV (postoperative nausea and vomiting)     Past Surgical History:  Procedure Laterality Date   BREAST LUMPECTOMY Right    benign   COLONOSCOPY     CYSTOSCOPY WITH INJECTION N/A 04/23/2017   Procedure: CYSTOSCOPY WITH INJECTION/ BOTOX 200 UNITS;  Surgeon: Festus Aloe, MD;  Location: [facility];  Service: Urology;  Laterality: N/A;   OOPHORECTOMY Left    2013   TONSILLECTOMY AND ADENOIDECTOMY     age 61   TRANSURETHRAL RESECTION OF BLADDER TUMOR N/A 04/23/2017   Procedure: TRANSURETHRAL RESECTION OF BLADDER TUMOR (TURBT);  Surgeon: Festus Aloe, MD;  Location: [facility];  Service: Urology;  Laterality:  N/A;    There were no vitals filed for this visit.   Subjective Assessment - 04/28/21 0932     Subjective Pt states that she feels "normal" today, slept well last night. HEP going well. 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    Patient Stated Goals Wants to be able to walk better.    Currently in Pain? No/denies    Pain Onset 1 to 4 weeks ago                [facility] PT Assessment - 04/28/21 0934       Transfers   Five time sit to stand comments  20.69s w/BUE support      Berg Balance Test   Sit to Stand Able to stand without using hands and stabilize independently    Standing Unsupported Able to stand safely 2 minutes    Sitting with Back Unsupported but Feet Supported on Floor or Stool Able to sit safely and securely 2 minutes    Stand to Sit Sits safely with minimal use of hands    Transfers  Able to transfer safely, minor use of hands    Standing Unsupported with Eyes Closed Able to stand 10 seconds safely    Standing Unsupported with Feet Together Able to place feet together independently and stand 1 minute safely    From Standing, Reach Forward with Outstretched Arm Can reach forward >12 cm safely (5")    From Standing Position, Pick up Object from Floor Able to pick up shoe, needs supervision    From Standing Position, Turn to Look Behind Over each Shoulder Looks behind one side only/other side shows less weight shift    Turn 360 Degrees Able to turn 360 degrees safely but slowly    Standing Unsupported, Alternately Place Feet on Step/Stool Able to complete 4 steps without aid or supervision    Standing Unsupported, One Foot in Front Able to take small step independently and hold 30 seconds    Standing on One  Leg Tries to lift leg/unable to hold 3 seconds but remains standing independently    Total Score 44    Berg comment: Significant fall risk                           [facility] Adult PT Treatment/Exercise - 04/28/21 0934       Ambulation/Gait   Ambulation/Gait Yes    Ambulation/Gait Assistance 6: Modified independent (Device/Increase time)    Ambulation/Gait Assistance Details 115' x3 w/rollator without rest break and pt holding conversation throughout, indicating improved cardiovascular endurance and functional mobility. Noted minor lateral gait deviations throughout but no LOB noted    Assistive device Rollator    Gait Pattern Step-through pattern;Decreased stride length;Decreased dorsiflexion - right;Decreased dorsiflexion - left    Ambulation Surface Level;Indoor                     PT Education - 04/28/21 1214     Education Details Discussed results of outcome measures assessed and goal updates    Person(s) Educated Patient    Methods Explanation    Comprehension Verbalized understanding              PT Short Term Goals - 04/28/21 0959       PT SHORT TERM GOAL #1   Title Pt will be independent with initial HEP with family supervision.    Time 4    Period Weeks    Status New    Target Date 05/02/21      PT SHORT TERM GOAL #2   Title Pt will demo Berg Balance Score of 40/56 to demo return to prior level from before her hospitalization    Baseline 33/56 on 04/04/21; 44/56 on 04/28/21    Time 4    Period Weeks    Status Achieved    Target Date 05/02/21      PT SHORT TERM GOAL #3   Title Pt will improve 5x sit <> stand using UE support from chair to 25 seconds or less in order to demo improved functional BLE strength/decr fall risk.    Baseline 35.17 sec on 04/04/21; 20.69s on 04/28/21 w/BUE support    Time 4    Period Weeks    Status Achieved    Target Date 05/02/21      PT SHORT TERM GOAL #4   Title Pt will improve gait speed with rollator  to at least 1.8 ft/sec in order to demo decr fall risk.    Baseline 1.46 ft/sec with rollator.  Time 4    Period Weeks    Status New      PT SHORT TERM GOAL #5   Title Pt will be able to tolerate ambulating at least 6 min without rest    Baseline Only tolerating 4 min 04/04/21 per pt report    Time 4    Period Weeks    Status New    Target Date 05/02/21               PT Long Term Goals - 04/04/21 1040       PT LONG TERM GOAL #1   Title Pt will be independent with final HEP with family supervision in order to build upon functional gains made in therapy.    Time 8    Period Weeks    Status New    Target Date 05/30/21      PT LONG TERM GOAL #2   Title Pt will improve BERG to at least a 46/56 in order to demo decr fall risk.    Baseline 33/56    Time 8    Period Weeks    Status New    Target Date 05/30/21      PT LONG TERM GOAL #3   Title Pt will improve 30 second chair stand to at least 7 sit <> stands using BUE support in order to demo improved strength/endurance.    Baseline 4 sit <> Stands on 04/04/21    Time 8    Period Weeks    Status New    Target Date 05/30/21      PT LONG TERM GOAL #4   Title Pt will perform TUG in 24 seconds or less with rollator vs. LRAD in order to demo decr fall risk.    Baseline 29.27 with rollator on 04/04/21    Time 8    Period Weeks    Status New    Target Date 05/30/21      PT LONG TERM GOAL #5   Title Pt will improve gait speed with rollator to at least 2.1 ft/sec in order to demo decr fall risk/improved efficiency with gait.    Baseline 1.42 ft/sec    Time 8    Period Weeks    Status New    Target Date 05/30/21      Additional Long Term Goals   Additional Long Term Goals Yes      PT LONG TERM GOAL #6   Title Pt will increase 2 MWT distance to at least 239' to demo [facility] for MS population    Baseline 176' on 04/04/21    Time 8    Period Weeks    Status New    Target Date 05/30/21                   Plan -  04/28/21 0939     Clinical Impression Statement Emphasis of session on balance reassessment and endurance. Pt continues to be limited by fatigue and requires frequent short seated rest breaks. Vitals stable throughout session. Pt has met 2 of 4 STGs, improving her Berg to 44/56 and 5 STS to 20s w/BUE support, inidicating improved balance and BLE strength. Pt reported 5-7/10 fatigue throughout session and tolerated ambulating for 4 minutes prior to requiring seated rest break. Pt is progressing towards LTGs, continue POC    Personal Factors and Comorbidities Comorbidity 1;Time since onset of injury/illness/exacerbation;Behavior Pattern;Past/Current Experience    Comorbidities PMH: RRMS (diagnosed 10/2014)    Examination-Activity  Limitations Carry;Lift;Locomotion Level;Squat;Stairs;Transfers;Stand;Reach Overhead    Examination-Participation Restrictions Cleaning;Community Activity;Laundry    Stability/Clinical Decision Making Evolving/Moderate complexity    Rehab Potential Good    PT Frequency 2x / week    PT Duration 8 weeks    PT Treatment/Interventions ADLs/Self Care Home Management;Aquatic Therapy;DME Instruction;Stair training;Functional mobility training;Gait training;Balance training;Therapeutic activities;Neuromuscular re-education;Therapeutic exercise;Patient/family education;Passive range of motion;Vestibular;Manual techniques;Energy conservation;Taping    PT Next Visit Plan Check remainder of STGd. Progress HEP to standing activity if able. Cardiovascular endurance, EC and narrow BOS activities.    PT Home Exercise Plan Access Code: [email or web address]; Currently has walking program from acute care PT and upper body exercises    Consulted and Agree with Plan of Care Patient             Patient will benefit from skilled therapeutic intervention in order to improve the following deficits and impairments:  Abnormal gait, Decreased balance, Decreased activity tolerance, Decreased coordination,  Decreased endurance, Decreased knowledge of use of DME, Decreased mobility, Decreased strength, Difficulty walking, Postural dysfunction, Pain  Visit Diagnosis: Muscle weakness (generalized)  Unsteadiness on feet  Other symptoms and signs involving the nervous system     Problem List Patient Active Problem List   Diagnosis Date Noted   Multiple sclerosis ([place]) 03/08/2021   Hypersomnia 01/12/2021   High risk medication use 01/06/2020   Vitamin D deficiency 01/06/2020   Normocytic anemia 09/26/2016   Hematuria    Urinary retention    Slow transit constipation    Yeast infection    Acute lower UTI    Other complicated headache syndrome    Acute blood loss anemia    Reactive hypertension    Multiple sclerosis exacerbation ([place]) 04/24/2016   Neurogenic bladder 04/24/2016   Weakness of both arms    Weakness of both legs    Sepsis ([place]) 04/21/2016   Bilateral hydronephrosis 04/21/2016   AKI (acute kidney injury) ([place]) 04/21/2016   Sepsis secondary to UTI ([place]) 04/20/2016   Attention deficit disorder 02/23/2016   Abnormality of gait 11/24/2015   Generalized weakness 11/24/2015   Urinary urgency 11/24/2015   UTI (lower urinary tract infection) 10/29/2015   Leukocytosis 10/29/2015   Nausea and vomiting 10/29/2015   MS (multiple sclerosis) ([place]) 10/26/2014    Cruzita Lederer Devlyn Retter, PT, DPT 04/28/2021, 12:15 PM  [facility] [address] [place], [place], [address] Phone: [phone]   Fax:  [phone]  Name: Crystal Park MRN: [id number] Date of Birth: 03-Jan-1961

## 2021-04-28 NOTE — Therapy (Signed)
[facility] [address] [place], [place], [address] Phone: [phone]   Fax:  [phone]  Occupational Therapy Treatment  Patient Details  Name: Crystal Park MRN: [id number] Date of Birth: 1960/08/09 Referring Provider (OT): Dr. Berline Chough   Encounter Date: 04/28/2021   OT End of Session - 04/28/21 1039     Visit Number 5    Number of Visits 25    Date for OT Re-Evaluation 06/27/21    Authorization Type [employer]    Authorization Time Period 12 weeks    Authorization - Visit Number 5    Authorization - Number of Visits 75   days, combined   OT Start Time 1015    OT Stop Time 1055    OT Time Calculation (min) 40 min    Activity Tolerance Patient tolerated treatment well    Behavior During Therapy [facility] for tasks assessed/performed             Past Medical History:  Diagnosis Date   Benign paroxysmal positional vertigo 10/12/2014   Constipation    Family history of breast cancer    Headache    history of migaines   History of kidney stones    History of MRSA infection    History of normocytic normochromic anemia    MS (multiple sclerosis) (HCC)    diagnosed 9/16   Neurogenic bladder    self-caths   PONV (postoperative nausea and vomiting)     Past Surgical History:  Procedure Laterality Date   BREAST LUMPECTOMY Right    benign   COLONOSCOPY     CYSTOSCOPY WITH INJECTION N/A 04/23/2017   Procedure: CYSTOSCOPY WITH INJECTION/ BOTOX 200 UNITS;  Surgeon: Jerilee Field, MD;  Location: [facility];  Service: Urology;  Laterality: N/A;   OOPHORECTOMY Left    2013   TONSILLECTOMY AND ADENOIDECTOMY     age 61   TRANSURETHRAL RESECTION OF BLADDER TUMOR N/A 04/23/2017   Procedure: TRANSURETHRAL RESECTION OF BLADDER TUMOR (TURBT);  Surgeon: Jerilee Field, MD;  Location: [facility];  Service: Urology;  Laterality: N/A;    There were no vitals filed for this visit.    Subjective Assessment - 04/28/21 1039     Subjective  "my endurance is not very good    Pertinent History MS, sepsis due to UTI, hx of vertigo    Patient Stated Goals improve RUE functional use    Currently in Pain? No/denies                        Treatment: Arm bike x 5 mins level 1 for conditioning Functional reaching in seated to place washers on elevated targets with left and right UE's min v.c for upright posture. Standing to place large to small pegs in semicircle alternating UE's, then removing, rest break required following task.          OT Treatment/Education - 04/28/21 1042     Education Details [employer] Access Code: [email or web address], 10 reps each, min v.c for positioning/ posture    Person(s) Educated Patient    Methods Explanation;Demonstration;Verbal cues    Comprehension Verbalized understanding;Returned demonstration              OT Short Term Goals - 04/28/21 1047       OT SHORT TERM GOAL #1   Title Pt will be independent with initial HEP    Time 4    Period Weeks  Status Achieved    Target Date 05/02/21      OT SHORT TERM GOAL #2   Title Pt will stand x 7 mins for ADL/functional tasks prior to rest break.    Baseline up to 5 minutes    Time 4    Period Weeks    Status On-going      OT SHORT TERM GOAL #3   Title Pt will verbalize understanding of energy conservation strategies.    Time 4    Period Weeks    Status Achieved      OT SHORT TERM GOAL #4   Title Pt will verbalize understanding of adapted strategies/ AE to maximize safety and I with ADLs/ IADLs .    Time 4    Period Weeks    Status On-going      OT SHORT TERM GOAL #5   Title Pt will retrieve a lightweight object at 125* shoulder flexion with RUE for increased functional use.    Baseline 120 shoulder flexion    Time 4    Period Weeks    Status On-going   Pt placing cones on high shelf in clinic requiring 120-125* sh flex              OT  Long Term Goals - 04/04/21 1238       OT LONG TERM GOAL #1   Title Pt will be independent with updated HEP.-    Time 12    Period Weeks    Target Date 06/27/21      OT LONG TERM GOAL #2   Title Pt will load the washing machine modified independently    Time 12    Period Weeks    Status New      OT LONG TERM GOAL #3   Title Pt will perfrom simple snack prep/ microwave cooking modified independently.    Time 12    Period Weeks    Status New      OT LONG TERM GOAL #4   Title Pt will improve R hand strength  to at least 40 lbs for increased ease with opening containers.    Time 12    Period Weeks    Status New                   Plan - 04/28/21 1043     Clinical Impression Statement Pt continues to fatigue quickly in OT, requiring frequent rest breaks. Pt was seen by PT prior to OT session. Pt is making small gains  in strength.    OT Occupational Profile and History Problem Focused Assessment - Including review of records relating to presenting problem    Occupational performance deficits (Please refer to evaluation for details): ADL's;IADL's;Play;Leisure;Social Participation    Body Structure / Function / Physical Skills ADL;Balance;Mobility;Strength;Flexibility;UE functional use;FMC;Coordination;Gait;ROM;GMC;Decreased knowledge of precautions;Decreased knowledge of use of DME;Dexterity;IADL    Rehab Potential Good    Clinical Decision Making Limited treatment options, no task modification necessary    Comorbidities Affecting Occupational Performance: May have comorbidities impacting occupational performance    Modification or Assistance to Complete Evaluation  No modification of tasks or assist necessary to complete eval    OT Frequency 2x / week   anticipate d/c after * weeks however 12 week POC written to account for scheduling   OT Duration 12 weeks    OT Treatment/Interventions Self-care/ADL training;Therapeutic exercise;Balance training;Functional Mobility  Training;Ultrasound;Aquatic Therapy;Neuromuscular education;Manual Therapy;Therapeutic activities;Cryotherapy;Paraffin;DME and/or AE instruction;Fluidtherapy;Patient/family education;Passive range of motion;Moist Heat  Plan progress towards remaining STG's, continue functional reaching and standing tolerance, arm bike    Consulted and Agree with Plan of Care Patient             Patient will benefit from skilled therapeutic intervention in order to improve the following deficits and impairments:   Body Structure / Function / Physical Skills: ADL, Balance, Mobility, Strength, Flexibility, UE functional use, FMC, Coordination, Gait, ROM, GMC, Decreased knowledge of precautions, Decreased knowledge of use of DME, Dexterity, IADL       Visit Diagnosis: Muscle weakness (generalized)  Unsteadiness on feet  Other symptoms and signs involving the nervous system  Other abnormalities of gait and mobility    Problem List Patient Active Problem List   Diagnosis Date Noted   Multiple sclerosis ([place]) 03/08/2021   Hypersomnia 01/12/2021   High risk medication use 01/06/2020   Vitamin D deficiency 01/06/2020   Normocytic anemia 09/26/2016   Hematuria    Urinary retention    Slow transit constipation    Yeast infection    Acute lower UTI    Other complicated headache syndrome    Acute blood loss anemia    Reactive hypertension    Multiple sclerosis exacerbation ([place]) 04/24/2016   Neurogenic bladder 04/24/2016   Weakness of both arms    Weakness of both legs    Sepsis ([place]) 04/21/2016   Bilateral hydronephrosis 04/21/2016   AKI (acute kidney injury) ([place]) 04/21/2016   Sepsis secondary to UTI ([place]) 04/20/2016   Attention deficit disorder 02/23/2016   Abnormality of gait 11/24/2015   Generalized weakness 11/24/2015   Urinary urgency 11/24/2015   UTI (lower urinary tract infection) 10/29/2015   Leukocytosis 10/29/2015   Nausea and vomiting 10/29/2015   MS (multiple sclerosis)  ([place]) 10/26/2014    Arslan Kier, OT 04/28/2021, 10:51 AM  [facility] [address] [place], [place], [address] Phone: [phone]   Fax:  [phone]  Name: Crystal Park MRN: [id number] Date of Birth: August 30, 1960

## 2021-05-01 ENCOUNTER — Other Ambulatory Visit: Payer: Self-pay

## 2021-05-01 ENCOUNTER — Ambulatory Visit: Payer: Managed Care, Other (non HMO) | Admitting: Occupational Therapy

## 2021-05-01 ENCOUNTER — Encounter: Payer: Self-pay | Admitting: Occupational Therapy

## 2021-05-01 ENCOUNTER — Encounter: Payer: Self-pay | Admitting: Physical Therapy

## 2021-05-01 ENCOUNTER — Ambulatory Visit: Payer: Managed Care, Other (non HMO) | Admitting: Physical Therapy

## 2021-05-01 DIAGNOSIS — R2689 Other abnormalities of gait and mobility: Secondary | ICD-10-CM

## 2021-05-01 DIAGNOSIS — R2681 Unsteadiness on feet: Secondary | ICD-10-CM | POA: Diagnosis not present

## 2021-05-01 DIAGNOSIS — M6281 Muscle weakness (generalized): Secondary | ICD-10-CM

## 2021-05-01 DIAGNOSIS — R29818 Other symptoms and signs involving the nervous system: Secondary | ICD-10-CM

## 2021-05-01 NOTE — Patient Instructions (Addendum)
Access Code: [email or web address] URL: [email or web address] Date: 05/01/2021 Prepared by: Camille Bal  Exercises Seated Long Arc Quad - 2 x daily - 5 x weekly - 1-2 sets - 10 reps Seated Hip Adduction Isometrics with Ball - 2 x daily - 5 x weekly - 2 sets - 5 reps Seated Hip Abduction with Resistance - 2 x daily - 5 x weekly - 2 sets - 10 reps Sit to Stand with Armchair - 2 x daily - 5 x weekly - 1 sets - 5 reps Standing March with Counter Support - 2 x daily - 5 x weekly - 1 sets - 5 reps Side Stepping with Counter Support - 1 x daily - 5 x weekly - 1 sets - 5 reps

## 2021-05-01 NOTE — Therapy (Signed)
[facility] [address] [place], [place], [address] Phone: [phone]   Fax:  [phone]  Physical Therapy Treatment  Patient Details  Name: Crystal Park MRN: [id number] Date of Birth: 1960/09/18 Referring Provider (PT): Courtney Heys, MD   Encounter Date: 05/01/2021   PT End of Session - 05/01/21 1230     Visit Number 6    Number of Visits 17    Date for PT Re-Evaluation 05/30/21    Authorization Type [employer], VL combined with PT & OT: 75 days,    Authorization - Visit Number 5    Authorization - Number of Visits 42    PT Start Time 1228    PT Stop Time 1314    PT Time Calculation (min) 46 min    Equipment Utilized During Treatment Gait belt    Behavior During Therapy WFL for tasks assessed/performed             Past Medical History:  Diagnosis Date   Benign paroxysmal positional vertigo 10/12/2014   Constipation    Family history of breast cancer    Headache    history of migaines   History of kidney stones    History of MRSA infection    History of normocytic normochromic anemia    MS (multiple sclerosis) ([place])    diagnosed 9/16   Neurogenic bladder    self-caths   PONV (postoperative nausea and vomiting)     Past Surgical History:  Procedure Laterality Date   BREAST LUMPECTOMY Right    benign   COLONOSCOPY     CYSTOSCOPY WITH INJECTION N/A 04/23/2017   Procedure: CYSTOSCOPY WITH INJECTION/ BOTOX 200 UNITS;  Surgeon: Festus Aloe, MD;  Location: [facility];  Service: Urology;  Laterality: N/A;   OOPHORECTOMY Left    2013   TONSILLECTOMY AND ADENOIDECTOMY     age 28   TRANSURETHRAL RESECTION OF BLADDER TUMOR N/A 04/23/2017   Procedure: TRANSURETHRAL RESECTION OF BLADDER TUMOR (TURBT);  Surgeon: Festus Aloe, MD;  Location: [facility];  Service: Urology;  Laterality: N/A;    There were no vitals filed for this visit.   Subjective Assessment -  05/01/21 1229     Subjective She slept okay last night, rates current fatigue following OT session a 4/10.    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    Patient Stated Goals Wants to be able to walk better.    Currently in Pain? No/denies    Pain Onset 1 to 4 weeks ago                               [facility] Adult PT Treatment/Exercise - 05/01/21 1234       Neuro Re-ed    Neuro Re-ed Details  Pt standing at [facility] w/ toes elevated on edge of blue wedge performing 8 STS w/ notable difficulty initiating standing and sitting without use of hands.  Improved control with RUE use.  Required seated rest between each rep.  Progressed to standing with toes elevated on blue wedge reaching to side to retrieve bean bags with ipsilateral and contralateral UEs.  Cues to engage trunk rotation especially  during reach across midline with contralateral UE.      Exercises   Exercises Other Exercises    Other Exercises  Reviewed HEP standing marches with cues for hip flexor engagement on RLE instead of lateral lean 1x20.  2 trials of side stepping in // bars, added to HEP using countertop.  Pt with noted quad compensation during lateral stepping to the right, able to maintain correction with tactile cuing.  Fatigue 6.5/10 following.  Pt in sitting using beige weighted ball raise and lower x10 to promote core engagement with tactile cues for forward flexion and extension.  Seated trunk rotations with UE extended x10 with beige weighted ball.      Knee/Hip Exercises: Aerobic   Nustep L3 x19mins cued for goal of >/=50steps per min, achieved avg 58 steps/min.  Verbal cues to keep RLE engaged as foot rolls into inversion.   Fatigue 4/10 following.                Access Code: [email or web address] URL: [email or web address] Date: 05/01/2021 Prepared by: Elease Etienne   Exercises Seated Long Arc Quad - 2 x daily - 5 x weekly - 1-2 sets - 10 reps Seated Hip Adduction Isometrics with Ball - 2 x daily - 5 x weekly - 2 sets - 5 reps Seated Hip Abduction with Resistance - 2 x daily - 5 x weekly - 2 sets - 10 reps Sit to Stand with Armchair - 2 x daily - 5 x weekly - 1 sets - 5 reps Standing March with Counter Support - 2 x daily - 5 x weekly - 1 sets - 5 reps  New Addition: Side Stepping with Counter Support - 1 x daily - 5 x weekly - 1 sets - 5 reps     PT Education - 05/01/21 1347     Education Details Discussed addition to HEP with chair and husband's supervision for safety.    Person(s) Educated Patient    Methods Explanation    Comprehension Verbalized understanding              PT Short Term Goals - 04/28/21 0959       PT SHORT TERM GOAL #1   Title Pt will be independent with initial HEP with family supervision.    Time 4    Period Weeks    Status New    Target Date 05/02/21      PT SHORT TERM GOAL #2   Title Pt will demo Berg Balance Score of 40/56 to demo return to prior level from before her hospitalization    Baseline 33/56 on 04/04/21; 44/56 on 04/28/21    Time 4    Period Weeks    Status Achieved    Target Date 05/02/21      PT SHORT TERM GOAL #3   Title Pt will improve 5x sit <> stand using UE support from chair to 25 seconds or less in order to demo improved functional BLE strength/decr fall risk.    Baseline 35.17 sec on 04/04/21; 20.69s on 04/28/21 w/BUE support    Time 4    Period Weeks    Status Achieved    Target Date 05/02/21      PT SHORT TERM GOAL #4   Title Pt will improve gait speed with rollator to at least 1.8 ft/sec in order to demo decr fall risk.    Baseline 1.46 ft/sec with rollator.    Time 4    Period Weeks    Status New  PT SHORT  TERM GOAL #5   Title Pt will be able to tolerate ambulating at least 6 min without rest    Baseline Only tolerating 4 min 04/04/21 per pt report    Time 4    Period Weeks    Status New    Target Date 05/02/21               PT Long Term Goals - 04/04/21 1040       PT LONG TERM GOAL #1   Title Pt will be independent with final HEP with family supervision in order to build upon functional gains made in therapy.    Time 8    Period Weeks    Status New    Target Date 05/30/21      PT LONG TERM GOAL #2   Title Pt will improve BERG to at least a 46/56 in order to demo decr fall risk.    Baseline 33/56    Time 8    Period Weeks    Status New    Target Date 05/30/21      PT LONG TERM GOAL #3   Title Pt will improve 30 second chair stand to at least 7 sit <> stands using BUE support in order to demo improved strength/endurance.    Baseline 4 sit <> Stands on 04/04/21    Time 8    Period Weeks    Status New    Target Date 05/30/21      PT LONG TERM GOAL #4   Title Pt will perform TUG in 24 seconds or less with rollator vs. LRAD in order to demo decr fall risk.    Baseline 29.27 with rollator on 04/04/21    Time 8    Period Weeks    Status New    Target Date 05/30/21      PT LONG TERM GOAL #5   Title Pt will improve gait speed with rollator to at least 2.1 ft/sec in order to demo decr fall risk/improved efficiency with gait.    Baseline 1.42 ft/sec    Time 8    Period Weeks    Status New    Target Date 05/30/21      Additional Long Term Goals   Additional Long Term Goals Yes      PT LONG TERM GOAL #6   Title Pt will increase 2 MWT distance to at least 239' to demo [facility] for MS population    Baseline 176' on 04/04/21    Time 8    Period Weeks    Status New    Target Date 05/30/21                   Plan - 05/01/21 1324     Clinical Impression Statement Focused today on standing tolerance and static balance challenges with pt requiring frequent rest breaks  throughout.  She demonstrates decreased forward weight shift during static and dynamic balance tasks.  Modified HEP to add further hip strengthening in standing with recommendation of husband nearby for safety.  Fatigue ranged 4-6.5/10 throughout session.    Personal Factors and Comorbidities Comorbidity 1;Time since onset of injury/illness/exacerbation;Behavior Pattern;Past/Current Experience    Comorbidities PMH: RRMS (diagnosed 10/2014)    Examination-Activity Limitations Carry;Lift;Locomotion Level;Squat;Stairs;Transfers;Stand;Reach Overhead    Examination-Participation Restrictions Cleaning;Community Activity;Laundry    Stability/Clinical Decision Making Evolving/Moderate complexity    Rehab Potential Good    PT Frequency 2x / week    PT Duration 8 weeks  PT Treatment/Interventions ADLs/Self Care Home Management;Aquatic Therapy;DME Instruction;Stair training;Functional mobility training;Gait training;Balance training;Therapeutic activities;Neuromuscular re-education;Therapeutic exercise;Patient/family education;Passive range of motion;Vestibular;Manual techniques;Energy conservation;Taping    PT Next Visit Plan Reassess remaining STGs-forgot from prior visit.  Work of retrostepping.  Glut strengthening.  Progress HEP to standing activity if able. Cardiovascular endurance, EC and narrow BOS activities.    PT Home Exercise Plan Access Code: [email or web address]; Currently has walking program from acute care PT and upper body exercises    Consulted and Agree with Plan of Care Patient             Patient will benefit from skilled therapeutic intervention in order to improve the following deficits and impairments:  Abnormal gait, Decreased balance, Decreased activity tolerance, Decreased coordination, Decreased endurance, Decreased knowledge of use of DME, Decreased mobility, Decreased strength, Difficulty walking, Postural dysfunction, Pain  Visit Diagnosis: Muscle weakness (generalized)  Unsteadiness  on feet  Other abnormalities of gait and mobility     Problem List Patient Active Problem List   Diagnosis Date Noted   Multiple sclerosis ([place]) 03/08/2021   Hypersomnia 01/12/2021   High risk medication use 01/06/2020   Vitamin D deficiency 01/06/2020   Normocytic anemia 09/26/2016   Hematuria    Urinary retention    Slow transit constipation    Yeast infection    Acute lower UTI    Other complicated headache syndrome    Acute blood loss anemia    Reactive hypertension    Multiple sclerosis exacerbation ([place]) 04/24/2016   Neurogenic bladder 04/24/2016   Weakness of both arms    Weakness of both legs    Sepsis ([place]) 04/21/2016   Bilateral hydronephrosis 04/21/2016   AKI (acute kidney injury) ([place]) 04/21/2016   Sepsis secondary to UTI ([place]) 04/20/2016   Attention deficit disorder 02/23/2016   Abnormality of gait 11/24/2015   Generalized weakness 11/24/2015   Urinary urgency 11/24/2015   UTI (lower urinary tract infection) 10/29/2015   Leukocytosis 10/29/2015   Nausea and vomiting 10/29/2015   MS (multiple sclerosis) ([place]) 10/26/2014    Bary Richard, PT 05/01/2021, 1:49 PM  [facility] [address] [place], [place], [address] Phone: [phone]   Fax:  [phone]  Name: Crystal Park MRN: [id number] Date of Birth: 1960/10/30

## 2021-05-01 NOTE — Therapy (Signed)
[facility] [address] [place], [place], [address] Phone: [phone]   Fax:  [phone]  Occupational Therapy Treatment  Patient Details  Name: Crystal Park MRN: [id number] Date of Birth: 1960/09/22 Referring Provider (OT): Dr. Dagoberto Ligas   Encounter Date: 05/01/2021   OT End of Session - 05/01/21 1153     Visit Number 6    Number of Visits 25    Date for OT Re-Evaluation 06/27/21    Authorization Type [employer]    Authorization Time Period 12 weeks    Authorization - Visit Number 6    Authorization - Number of Visits 75   days, combined   OT Start Time 1152    OT Stop Time 1230    OT Time Calculation (min) 38 min    Activity Tolerance Patient tolerated treatment well    Behavior During Therapy [facility] for tasks assessed/performed             Past Medical History:  Diagnosis Date   Benign paroxysmal positional vertigo 10/12/2014   Constipation    Family history of breast cancer    Headache    history of migaines   History of kidney stones    History of MRSA infection    History of normocytic normochromic anemia    MS (multiple sclerosis) ([place])    diagnosed 9/16   Neurogenic bladder    self-caths   PONV (postoperative nausea and vomiting)     Past Surgical History:  Procedure Laterality Date   BREAST LUMPECTOMY Right    benign   COLONOSCOPY     CYSTOSCOPY WITH INJECTION N/A 04/23/2017   Procedure: CYSTOSCOPY WITH INJECTION/ BOTOX 200 UNITS;  Surgeon: Festus Aloe, MD;  Location: [place];  Service: Urology;  Laterality: N/A;   OOPHORECTOMY Left    2013   TONSILLECTOMY AND ADENOIDECTOMY     age 61   TRANSURETHRAL RESECTION OF BLADDER TUMOR N/A 04/23/2017   Procedure: TRANSURETHRAL RESECTION OF BLADDER TUMOR (TURBT);  Surgeon: Festus Aloe, MD;  Location: [facility];  Service: Urology;  Laterality: N/A;    There were no vitals filed for this visit.    Subjective Assessment - 05/01/21 1153     Subjective  "sorry we are late" Pt denies pain.    Pertinent History MS, sepsis due to UTI, hx of vertigo    Patient Stated Goals improve RUE functional use    Currently in Pain? No/denies    Pain Score 0-No pain                [facility] OT Assessment - 05/01/21 0001       Hand Function   Right Hand Grip (lbs) 49.8                      OT Treatments/Exercises (OP) - 05/01/21 1212       ADLs   Functional Mobility pt tolerated 8 minutes of standing activities with 2 laps around clinic today and standing with resistance clothespins    Cooking pt reports making her own pizza in the oven    Home Maintenance putting dirty clothes in the laundry room but not in washing machine      Exercises   Exercises Work Hardening      Work Hardening Exercises   UBE (Upper Arm Bike) x 3 minutes forward, 1 min rest break, 3 minutes backward. for conditioning and endurance      Functional Reaching  Activities   High Level pt worked on reaching with RUE with resistance clothespins 1-8# with no complaints of pain and standing for extended period of time. Pt with good reach with RUE with min cues for standing positioning                      OT Short Term Goals - 05/01/21 1209       OT SHORT TERM GOAL #1   Title Pt will be independent with initial HEP    Time 4    Period Weeks    Status Achieved    Target Date 05/02/21      OT SHORT TERM GOAL #2   Title Pt will stand x 7 mins for ADL/functional tasks prior to rest break.    Baseline up to 5 minutes    Time 4    Period Weeks    Status Achieved   completed standing activities for 8 minutes 05/01/21     OT SHORT TERM GOAL #3   Title Pt will verbalize understanding of energy conservation strategies.    Time 4    Period Weeks    Status Achieved      OT SHORT TERM GOAL #4   Title Pt will verbalize understanding of adapted strategies/ AE to maximize safety and I with ADLs/  IADLs .    Time 4    Period Weeks    Status Achieved      OT SHORT TERM GOAL #5   Title Pt will retrieve a lightweight object at 125* shoulder flexion with RUE for increased functional use.    Baseline 120 shoulder flexion    Time 4    Period Weeks    Status Achieved   125* with RUE 05/01/21              OT Long Term Goals - 05/01/21 1214       OT LONG TERM GOAL #1   Title Pt will be independent with updated HEP.-    Time 12    Period Weeks    Status On-going    Target Date 06/27/21      OT LONG TERM GOAL #2   Title Pt will load the washing machine modified independently    Time 12    Period Weeks    Status On-going      OT LONG TERM GOAL #3   Title Pt will perfrom simple snack prep/ microwave cooking modified independently.    Time 12    Period Weeks    Status On-going   cooking pizza in the oven 04/2721     OT LONG TERM GOAL #4   Title Pt will improve R hand strength  to at least 40 lbs for increased ease with opening containers.    Time 12    Period Weeks    Status Achieved   49.8 05/01/21                  Plan - 05/01/21 1216     Clinical Impression Statement Pt has met all STGs and is progressing towards LTGs. Pt has met grip strength goal and is progressing towards increased independence with ADLs and IADLs.    OT Occupational Profile and History Problem Focused Assessment - Including review of records relating to presenting problem    Occupational performance deficits (Please refer to evaluation for details): ADL's;IADL's;Play;Leisure;Social Participation    Body Structure / Function / Physical Skills ADL;Balance;Mobility;Strength;Flexibility;UE functional use;FMC;Coordination;Gait;ROM;GMC;Decreased knowledge  of precautions;Decreased knowledge of use of DME;Dexterity;IADL    Rehab Potential Good    Clinical Decision Making Limited treatment options, no task modification necessary    Comorbidities Affecting Occupational Performance: May have  comorbidities impacting occupational performance    Modification or Assistance to Complete Evaluation  No modification of tasks or assist necessary to complete eval    OT Frequency 2x / week   anticipate d/c after * weeks however 12 week POC written to account for scheduling   OT Duration 12 weeks    OT Treatment/Interventions Self-care/ADL training;Therapeutic exercise;Balance training;Functional Mobility Training;Ultrasound;Aquatic Therapy;Neuromuscular education;Manual Therapy;Therapeutic activities;Cryotherapy;Paraffin;DME and/or AE instruction;Fluidtherapy;Patient/family education;Passive range of motion;Moist Heat    Plan continue functional reaching and standing tolerance, arm bike, home management, simple cooking    Consulted and Agree with Plan of Care Patient             Patient will benefit from skilled therapeutic intervention in order to improve the following deficits and impairments:   Body Structure / Function / Physical Skills: ADL, Balance, Mobility, Strength, Flexibility, UE functional use, FMC, Coordination, Gait, ROM, GMC, Decreased knowledge of precautions, Decreased knowledge of use of DME, Dexterity, IADL       Visit Diagnosis: Muscle weakness (generalized)  Unsteadiness on feet  Other abnormalities of gait and mobility  Other symptoms and signs involving the nervous system    Problem List Patient Active Problem List   Diagnosis Date Noted   Multiple sclerosis ([place]) 03/08/2021   Hypersomnia 01/12/2021   High risk medication use 01/06/2020   Vitamin D deficiency 01/06/2020   Normocytic anemia 09/26/2016   Hematuria    Urinary retention    Slow transit constipation    Yeast infection    Acute lower UTI    Other complicated headache syndrome    Acute blood loss anemia    Reactive hypertension    Multiple sclerosis exacerbation ([facility]) 04/24/2016   Neurogenic bladder 04/24/2016   Weakness of both arms    Weakness of both legs    Sepsis ([place])  04/21/2016   Bilateral hydronephrosis 04/21/2016   AKI (acute kidney injury) ([place]) 04/21/2016   Sepsis secondary to UTI ([place]) 04/20/2016   Attention deficit disorder 02/23/2016   Abnormality of gait 11/24/2015   Generalized weakness 11/24/2015   Urinary urgency 11/24/2015   UTI (lower urinary tract infection) 10/29/2015   Leukocytosis 10/29/2015   Nausea and vomiting 10/29/2015   MS (multiple sclerosis) ([place]) 10/26/2014    Zachery Conch, OT 05/01/2021, 1:07 PM  [facility] [address] [place], [place], [address] Phone: [phone]   Fax:  [phone]  Name: Crystal Park MRN: [id number] Date of Birth: [birth year]

## 2021-05-03 ENCOUNTER — Encounter: Payer: Self-pay | Admitting: Physical Therapy

## 2021-05-03 ENCOUNTER — Ambulatory Visit: Payer: Managed Care, Other (non HMO) | Admitting: Occupational Therapy

## 2021-05-03 ENCOUNTER — Other Ambulatory Visit: Payer: Self-pay

## 2021-05-03 ENCOUNTER — Encounter: Payer: Self-pay | Admitting: Occupational Therapy

## 2021-05-03 ENCOUNTER — Ambulatory Visit: Payer: Managed Care, Other (non HMO) | Attending: Family Medicine | Admitting: Physical Therapy

## 2021-05-03 DIAGNOSIS — R29818 Other symptoms and signs involving the nervous system: Secondary | ICD-10-CM | POA: Diagnosis present

## 2021-05-03 DIAGNOSIS — R2689 Other abnormalities of gait and mobility: Secondary | ICD-10-CM | POA: Insufficient documentation

## 2021-05-03 DIAGNOSIS — R2681 Unsteadiness on feet: Secondary | ICD-10-CM

## 2021-05-03 DIAGNOSIS — M6281 Muscle weakness (generalized): Secondary | ICD-10-CM | POA: Diagnosis present

## 2021-05-03 NOTE — Therapy (Signed)
[facility] [address] [place], [place], [address] Phone: [phone]   Fax:  [phone]  Physical Therapy Treatment  Patient Details  Name: Crystal Park MRN: [id number] Date of Birth: 1960/06/11 Referring Provider (PT): Courtney Heys, MD   Encounter Date: 05/03/2021   PT End of Session - 05/03/21 1622     Visit Number 7    Number of Visits 17    Date for PT Re-Evaluation 05/30/21    Authorization Type [employer], VL combined with PT & OT: 75 days,    Authorization - Visit Number 5    Authorization - Number of Visits 15    PT Start Time 3300    PT Stop Time 1230    PT Time Calculation (min) 41 min    Equipment Utilized During Treatment Gait belt    Behavior During Therapy WFL for tasks assessed/performed             Past Medical History:  Diagnosis Date   Benign paroxysmal positional vertigo 10/12/2014   Constipation    Family history of breast cancer    Headache    history of migaines   History of kidney stones    History of MRSA infection    History of normocytic normochromic anemia    MS (multiple sclerosis) ([place])    diagnosed 9/16   Neurogenic bladder    self-caths   PONV (postoperative nausea and vomiting)     Past Surgical History:  Procedure Laterality Date   BREAST LUMPECTOMY Right    benign   COLONOSCOPY     CYSTOSCOPY WITH INJECTION N/A 04/23/2017   Procedure: CYSTOSCOPY WITH INJECTION/ BOTOX 200 UNITS;  Surgeon: Festus Aloe, MD;  Location: [facility];  Service: Urology;  Laterality: N/A;   OOPHORECTOMY Left    2013   TONSILLECTOMY AND ADENOIDECTOMY     age 36   TRANSURETHRAL RESECTION OF BLADDER TUMOR N/A 04/23/2017   Procedure: TRANSURETHRAL RESECTION OF BLADDER TUMOR (TURBT);  Surgeon: Festus Aloe, MD;  Location: [facility];  Service: Urology;  Laterality: N/A;    There were no vitals filed for this visit.   Subjective Assessment -  05/03/21 1154     Subjective Pt states she would like to finish up therapy next week as she does not feel there is anything else that we can do for her.  She would like to see how today goes and see if she really has made any progress.  She rates her fatigue following OT 4.5/10.    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    Patient Stated Goals Wants to be able to walk better.    Currently in Pain? No/denies    Pain Onset 1 to 4 weeks ago                               [facility] Adult PT Treatment/Exercise - 05/03/21 1156       Ambulation/Gait   Ambulation/Gait Yes    Ambulation/Gait Assistance 6: Modified independent (Device/Increase time)    Ambulation/Gait Assistance Details 669' w/o rest during 6MWT.  Pt completes additional 21' to chair for rest following activity.  Fatigue remained 4.5/10.  Mod cues for decreased UE weight bearing through rollator and paced breathing to increase participation time.    Ambulation Distance (Feet) 690 Feet    Assistive device Rollator    Gait Pattern Step-through pattern;Decreased stride length;Decreased dorsiflexion - right    Ambulation Surface Level;Indoor    Gait velocity 2.51ft/sec   w/ rollator     Neuro Re-ed    Neuro Re-ed Details  Pt performs 4 bouts of tandem walking at countertop w/ RUE support on counter.  Pt demos inc movement time and guarding with LUE, but demos no inc sway.  Pt performs backwards stepping at counter with tactile cues for upright trunk and inc glut max activation.      Exercises   Exercises Knee/Hip    Other Exercises  Reviewed side-stepping at counter x4 bouts from HEP to assess pt concern of safety at home.  Placed  emphasis on toes forward with pelvis aligned to counter to engage glut med more.  Pt states her concern at home is needing someone to be next to her.  Edu on having husband next to her during exercises to inc feeling of safety.      Knee/Hip Exercises: Standing   Hip Extension 1 set;Both;Stengthening    Extension Limitations PT provides facilitation at top of pelvis and front of shoulder to promote glut max activation and decrease trunk flexion compensation during standing kickbacks with knee bent.                     PT Education - 05/03/21 1621     Education Details Edu on having husband near her and ready for hands on when she needs that additional feeling of safety when performing standing exercises.    Person(s) Educated Patient    Methods Explanation    Comprehension Verbalized understanding              PT Short Term Goals - 05/03/21 1627       PT SHORT TERM GOAL #1   Title Pt will be independent with initial HEP with family supervision.    Baseline 05/03/2021 Pt reports fear of completing standing portion of HEP d/t feeling of unsteadiness.    Time 4    Period Weeks    Status Not Met    Target Date 05/02/21      PT SHORT TERM GOAL #2   Title Pt will demo Berg Balance Score of 40/56 to demo return to prior level from before her hospitalization    Baseline 33/56 on 04/04/21; 44/56 on 04/28/21    Time 4    Period Weeks    Status Achieved    Target Date 05/02/21      PT SHORT TERM GOAL #3   Title Pt will improve 5x sit <> stand using UE support from chair to 25 seconds or less in order to demo improved functional BLE strength/decr fall risk.    Baseline 35.17 sec on 04/04/21; 20.69s on 04/28/21 w/BUE support    Time 4    Period Weeks    Status Achieved    Target Date 05/02/21      PT SHORT TERM GOAL #4   Title Pt will improve gait speed with rollator to at least 1.8 ft/sec in order to demo decr fall risk.    Baseline 1.46 ft/sec with rollator.  05/03/2021 2.15  ft/sec at fastest speed with rollator    Time 4    Period Weeks  Status Achieved      PT SHORT TERM GOAL #5   Title Pt will be able to tolerate ambulating at least 6 min without rest    Baseline Only tolerating 4 min 04/04/21 per pt report; 05/03/2021 Pt tolerates 6 min continuous ambulation with rollator.    Time 4    Period Weeks    Status Achieved    Target Date 05/02/21               PT Long Term Goals - 04/04/21 1040       PT LONG TERM GOAL #1   Title Pt will be independent with final HEP with family supervision in order to build upon functional gains made in therapy.    Time 8    Period Weeks    Status New    Target Date 05/30/21      PT LONG TERM GOAL #2   Title Pt will improve BERG to at least a 46/56 in order to demo decr fall risk.    Baseline 33/56    Time 8    Period Weeks    Status New    Target Date 05/30/21      PT LONG TERM GOAL #3   Title Pt will improve 30 second chair stand to at least 7 sit <> stands using BUE support in order to demo improved strength/endurance.    Baseline 4 sit <> Stands on 04/04/21    Time 8    Period Weeks    Status New    Target Date 05/30/21      PT LONG TERM GOAL #4   Title Pt will perform TUG in 24 seconds or less with rollator vs. LRAD in order to demo decr fall risk.    Baseline 29.27 with rollator on 04/04/21    Time 8    Period Weeks    Status New    Target Date 05/30/21      PT LONG TERM GOAL #5   Title Pt will improve gait speed with rollator to at least 2.1 ft/sec in order to demo decr fall risk/improved efficiency with gait.    Baseline 1.42 ft/sec    Time 8    Period Weeks    Status New    Target Date 05/30/21      Additional Long Term Goals   Additional Long Term Goals Yes      PT LONG TERM GOAL #6   Title Pt will increase 2 MWT distance to at least 239' to demo [facility] for MS population    Baseline 176' on 04/04/21    Time 8    Period Weeks    Status New    Target Date 05/30/21                    Plan - 05/03/21 1623     Clinical Impression Statement Reassessed remaining 3 STGs with patient demonstrating improved walking endurance with ability to ambulate with rollator for 6 mins.  She ambulates with improved gait speed of 2.15 ft/sec.  Reviewed part of HEP to increase pt compliance and safety.  Fatigue remained low throughout session ending at 5/10.    Personal Factors and Comorbidities Comorbidity 1;Time since onset of injury/illness/exacerbation;Behavior Pattern;Past/Current Experience    Comorbidities PMH: RRMS (diagnosed 10/2014)    Examination-Activity Limitations Carry;Lift;Locomotion Level;Squat;Stairs;Transfers;Stand;Reach Overhead    Examination-Participation Restrictions Cleaning;Community Activity;Laundry    Stability/Clinical Decision Making Evolving/Moderate complexity    Rehab Potential Good  PT Frequency 2x / week    PT Duration 8 weeks    PT Treatment/Interventions ADLs/Self Care Home Management;Aquatic Therapy;DME Instruction;Stair training;Functional mobility training;Gait training;Balance training;Therapeutic activities;Neuromuscular re-education;Therapeutic exercise;Patient/family education;Passive range of motion;Vestibular;Manual techniques;Energy conservation;Taping    PT Next Visit Plan Discharge 05/11/21 per pt request.  Work of retrostepping.  Glut strengthening.  Progress HEP to standing activity if able. Cardiovascular endurance, EC and narrow BOS activities.    PT Home Exercise Plan Access Code: [email or web address]; Currently has walking program from acute care PT and upper body exercises    Consulted and Agree with Plan of Care Patient             Patient will benefit from skilled therapeutic intervention in order to improve the following deficits and impairments:  Abnormal gait, Decreased balance, Decreased activity tolerance, Decreased coordination, Decreased endurance, Decreased knowledge of use of DME, Decreased mobility, Decreased strength,  Difficulty walking, Postural dysfunction, Pain  Visit Diagnosis: Muscle weakness (generalized)  Unsteadiness on feet  Other abnormalities of gait and mobility     Problem List Patient Active Problem List   Diagnosis Date Noted   Multiple sclerosis ([place]) 03/08/2021   Hypersomnia 01/12/2021   High risk medication use 01/06/2020   Vitamin D deficiency 01/06/2020   Normocytic anemia 09/26/2016   Hematuria    Urinary retention    Slow transit constipation    Yeast infection    Acute lower UTI    Other complicated headache syndrome    Acute blood loss anemia    Reactive hypertension    Multiple sclerosis exacerbation ([place]) 04/24/2016   Neurogenic bladder 04/24/2016   Weakness of both arms    Weakness of both legs    Sepsis ([place]) 04/21/2016   Bilateral hydronephrosis 04/21/2016   AKI (acute kidney injury) ([place]) 04/21/2016   Sepsis secondary to UTI ([place]) 04/20/2016   Attention deficit disorder 02/23/2016   Abnormality of gait 11/24/2015   Generalized weakness 11/24/2015   Urinary urgency 11/24/2015   UTI (lower urinary tract infection) 10/29/2015   Leukocytosis 10/29/2015   Nausea and vomiting 10/29/2015   MS (multiple sclerosis) ([place]) 10/26/2014    Bary Richard, PT, DPT 05/03/2021, 4:37 PM  [facility] [address] [place], [place], [address] Phone: [phone]   Fax:  [phone]  Name: Crystal Park MRN: [id number] Date of Birth: Oct 25, 1960

## 2021-05-03 NOTE — Therapy (Signed)
[facility] [address] [place], [place], [address] Phone: [phone]   Fax:  [phone]  Occupational Therapy Treatment  Patient Details  Name: Crystal Park MRN: [id number] Date of Birth: [birth year] Referring Provider (OT): Dr. Berline Chough   Encounter Date: 05/03/2021   OT End of Session - 05/03/21 1105     Visit Number 7    Number of Visits 25    Date for OT Re-Evaluation 06/27/21    Authorization Type [employer]    Authorization Time Period 12 weeks    Authorization - Visit Number 7    Authorization - Number of Visits 75    OT Start Time 1103    OT Stop Time 1142    OT Time Calculation (min) 39 min             Past Medical History:  Diagnosis Date   Benign paroxysmal positional vertigo 10/12/2014   Constipation    Family history of breast cancer    Headache    history of migaines   History of kidney stones    History of MRSA infection    History of normocytic normochromic anemia    MS (multiple sclerosis) (HCC)    diagnosed 9/16   Neurogenic bladder    self-caths   PONV (postoperative nausea and vomiting)     Past Surgical History:  Procedure Laterality Date   BREAST LUMPECTOMY Right    benign   COLONOSCOPY     CYSTOSCOPY WITH INJECTION N/A 04/23/2017   Procedure: CYSTOSCOPY WITH INJECTION/ BOTOX 200 UNITS;  Surgeon: Jerilee Field, MD;  Location: [facility];  Service: Urology;  Laterality: N/A;   OOPHORECTOMY Left    2013   TONSILLECTOMY AND ADENOIDECTOMY     age 61   TRANSURETHRAL RESECTION OF BLADDER TUMOR N/A 04/23/2017   Procedure: TRANSURETHRAL RESECTION OF BLADDER TUMOR (TURBT);  Surgeon: Jerilee Field, MD;  Location: [facility]  Service: Urology;  Laterality: N/A;    There were no vitals filed for this visit.   Subjective Assessment - 05/03/21 1105     Subjective  Denies pain    Pertinent History MS, sepsis due to UTI, hx of vertigo     Patient Stated Goals improve RUE functional use    Currently in Pain? No/denies                        Treatment: Standing to copy medium peg design for increased activity tolerance and fine motor coordination, 1 rest break required.  Arm bike level 1 x 6 mins for conditioning          OT Education - 05/03/21 1130     Education Details Theraband Access Code: [email or web address], Reviewed theraband exercises 10 reps each, min v.c for positioning/ posture, upgraded to red band for increased resistance, several rest breaks required.    Person(s) Educated Patient    Methods Explanation;Demonstration;Verbal cues    Comprehension Verbalized understanding;Returned demonstration;Verbal cues required              OT Short Term Goals - 05/01/21 1209       OT SHORT TERM GOAL #1   Title Pt will be independent with initial HEP    Time 4    Period Weeks    Status Achieved    Target Date 05/02/21      OT SHORT TERM GOAL #2   Title Pt will stand x 7 mins for ADL/functional tasks  prior to rest break.    Baseline up to 5 minutes    Time 4    Period Weeks    Status Achieved   completed standing activities for 8 minutes 05/01/21     OT SHORT TERM GOAL #3   Title Pt will verbalize understanding of energy conservation strategies.    Time 4    Period Weeks    Status Achieved      OT SHORT TERM GOAL #4   Title Pt will verbalize understanding of adapted strategies/ AE to maximize safety and I with ADLs/ IADLs .    Time 4    Period Weeks    Status Achieved      OT SHORT TERM GOAL #5   Title Pt will retrieve a lightweight object at 125* shoulder flexion with RUE for increased functional use.    Baseline 120 shoulder flexion    Time 4    Period Weeks    Status Achieved   125* with RUE 05/01/21              OT Long Term Goals - 05/03/21 1143       OT LONG TERM GOAL #1   Title Pt will be independent with updated HEP.-    Time 12    Period Weeks    Status On-going     Target Date 06/27/21      OT LONG TERM GOAL #2   Title Pt will load the washing machine modified independently    Time 12    Period Weeks    Status Achieved      OT LONG TERM GOAL #3   Title Pt will perfrom simple snack prep/ microwave cooking modified independently.    Time 12    Period Weeks    Status On-going   cooking pizza in the oven 04/2721, pt reports using air fryer mod I 05/03/21     OT LONG TERM GOAL #4   Title Pt will improve R hand strength  to at least 40 lbs for increased ease with opening containers.    Time 12    Period Weeks    Status Achieved   49.8 05/01/21     OT LONG TERM GOAL #5   Status Achieved                   Plan - 05/03/21 1131     Clinical Impression Statement Pt demonstrates good progress with improving strength and endurance. Pt reports washing and drying laundry mod I. Pt will  likely d/c before end of March.    OT Occupational Profile and History Problem Focused Assessment - Including review of records relating to presenting problem    Occupational performance deficits (Please refer to evaluation for details): ADL's;IADL's;Play;Leisure;Social Participation    Body Structure / Function / Physical Skills ADL;Balance;Mobility;Strength;Flexibility;UE functional use;FMC;Coordination;Gait;ROM;GMC;Decreased knowledge of precautions;Decreased knowledge of use of DME;Dexterity;IADL    Rehab Potential Good    Clinical Decision Making Limited treatment options, no task modification necessary    Comorbidities Affecting Occupational Performance: May have comorbidities impacting occupational performance    Modification or Assistance to Complete Evaluation  No modification of tasks or assist necessary to complete eval    OT Frequency 2x / week   anticipate d/c after * weeks however 12 week POC written to account for scheduling   OT Duration 12 weeks    OT Treatment/Interventions Self-care/ADL training;Therapeutic exercise;Balance training;Functional  Mobility Training;Ultrasound;Aquatic Therapy;Neuromuscular education;Manual Therapy;Therapeutic activities;Cryotherapy;Paraffin;DME and/or AE instruction;Fluidtherapy;Patient/family education;Passive  range of motion;Moist Heat    Plan continue functional reaching and standing tolerance, arm bike, home management, anticipate possible d/c next week    Consulted and Agree with Plan of Care Patient             Patient will benefit from skilled therapeutic intervention in order to improve the following deficits and impairments:   Body Structure / Function / Physical Skills: ADL, Balance, Mobility, Strength, Flexibility, UE functional use, FMC, Coordination, Gait, ROM, GMC, Decreased knowledge of precautions, Decreased knowledge of use of DME, Dexterity, IADL       Visit Diagnosis: Muscle weakness (generalized)  Unsteadiness on feet  Other abnormalities of gait and mobility  Other symptoms and signs involving the nervous system    Problem List Patient Active Problem List   Diagnosis Date Noted   Multiple sclerosis (HCC) 03/08/2021   Hypersomnia 01/12/2021   High risk medication use 01/06/2020   Vitamin D deficiency 01/06/2020   Normocytic anemia 09/26/2016   Hematuria    Urinary retention    Slow transit constipation    Yeast infection    Acute lower UTI    Other complicated headache syndrome    Acute blood loss anemia    Reactive hypertension    Multiple sclerosis exacerbation (HCC) 04/24/2016   Neurogenic bladder 04/24/2016   Weakness of both arms    Weakness of both legs    Sepsis (HCC) 04/21/2016   Bilateral hydronephrosis 04/21/2016   AKI (acute kidney injury) (HCC) 04/21/2016   Sepsis secondary to UTI (HCC) 04/20/2016   Attention deficit disorder 02/23/2016   Abnormality of gait 11/24/2015   Generalized weakness 11/24/2015   Urinary urgency 11/24/2015   UTI (lower urinary tract infection) 10/29/2015   Leukocytosis 10/29/2015   Nausea and vomiting 10/29/2015    MS (multiple sclerosis) (HCC) 10/26/2014    Dagmawi Venable, OT 05/03/2021, 12:15 PM  [facility] [address] [place], [place], [address] Phone: [phone]   Fax:  [phone]  Name: Crystal Park MRN: [id number] Date of Birth: November 25, 1960

## 2021-05-09 ENCOUNTER — Ambulatory Visit: Payer: Managed Care, Other (non HMO) | Admitting: Occupational Therapy

## 2021-05-09 ENCOUNTER — Encounter: Payer: Self-pay | Admitting: Physical Therapy

## 2021-05-09 ENCOUNTER — Other Ambulatory Visit: Payer: Self-pay

## 2021-05-09 ENCOUNTER — Ambulatory Visit: Payer: Managed Care, Other (non HMO) | Admitting: Physical Therapy

## 2021-05-09 DIAGNOSIS — R2689 Other abnormalities of gait and mobility: Secondary | ICD-10-CM

## 2021-05-09 DIAGNOSIS — M6281 Muscle weakness (generalized): Secondary | ICD-10-CM

## 2021-05-09 DIAGNOSIS — R2681 Unsteadiness on feet: Secondary | ICD-10-CM

## 2021-05-09 DIAGNOSIS — R29818 Other symptoms and signs involving the nervous system: Secondary | ICD-10-CM

## 2021-05-09 NOTE — Therapy (Signed)
[facility] [address] [place], [place], [address] Phone: [phone]   Fax:  [phone]  Occupational Therapy Treatment  Patient Details  Name: Crystal Park MRN: [id number] Date of Birth: [birth year] Referring Provider (OT): Dr. Dagoberto Ligas   Encounter Date: 05/09/2021   OT End of Session - 05/09/21 1124     Visit Number 8    Number of Visits 25    Date for OT Re-Evaluation 06/27/21    Authorization Type [employer]    Authorization Time Period 12 weeks    Authorization - Visit Number 8    Authorization - Number of Visits 75    OT Start Time 1104    OT Stop Time 1144    OT Time Calculation (min) 40 min    Activity Tolerance Patient tolerated treatment well    Behavior During Therapy [facility] for tasks assessed/performed             Past Medical History:  Diagnosis Date   Benign paroxysmal positional vertigo 10/12/2014   Constipation    Family history of breast cancer    Headache    history of migaines   History of kidney stones    History of MRSA infection    History of normocytic normochromic anemia    MS (multiple sclerosis) ([place])    diagnosed 9/16   Neurogenic bladder    self-caths   PONV (postoperative nausea and vomiting)     Past Surgical History:  Procedure Laterality Date   BREAST LUMPECTOMY Right    benign   COLONOSCOPY     CYSTOSCOPY WITH INJECTION N/A 04/23/2017   Procedure: CYSTOSCOPY WITH INJECTION/ BOTOX 200 UNITS;  Surgeon: Festus Aloe, MD;  Location: [place];  Service: Urology;  Laterality: N/A;   OOPHORECTOMY Left    2013   TONSILLECTOMY AND ADENOIDECTOMY     age 89   TRANSURETHRAL RESECTION OF BLADDER TUMOR N/A 04/23/2017   Procedure: TRANSURETHRAL RESECTION OF BLADDER TUMOR (TURBT);  Surgeon: Festus Aloe, MD;  Location: [facility];  Service: Urology;  Laterality: N/A;    There were no vitals filed for this visit.   Subjective  Assessment - 05/09/21 1124     Subjective  Denies pain    Pertinent History MS, sepsis due to UTI, hx of vertigo    Patient Stated Goals improve RUE functional use    Currently in Pain? No/denies                 Treatment: Standing to place graded clothespins 1-8# on vertical antennae with left and right UE's, rest break in between, placing and removing , min v.c for posture Arm bike x 6 mins level 1 for condtioning                 OT Education - 05/09/21 1120     Education Details Reveiwed red theraband exercises(Theraband Access Code: [email or web address], 10-15 reps each, min v.c for positioning/ posture,  several rest breaks required.), upgraded putty exercises to red for sustained grip and pinch    Person(s) Educated Patient    Methods Explanation;Demonstration;Verbal cues    Comprehension Verbalized understanding;Returned demonstration;Verbal cues required              OT Short Term Goals - 05/01/21 1209       OT SHORT TERM GOAL #1   Title Pt will be independent with initial HEP    Time 4    Period Weeks  Status Achieved    Target Date 05/02/21      OT SHORT TERM GOAL #2   Title Pt will stand x 7 mins for ADL/functional tasks prior to rest break.    Baseline up to 5 minutes    Time 4    Period Weeks    Status Achieved   completed standing activities for 8 minutes 05/01/21     OT SHORT TERM GOAL #3   Title Pt will verbalize understanding of energy conservation strategies.    Time 4    Period Weeks    Status Achieved      OT SHORT TERM GOAL #4   Title Pt will verbalize understanding of adapted strategies/ AE to maximize safety and I with ADLs/ IADLs .    Time 4    Period Weeks    Status Achieved      OT SHORT TERM GOAL #5   Title Pt will retrieve a lightweight object at 125* shoulder flexion with RUE for increased functional use.    Baseline 120 shoulder flexion    Time 4    Period Weeks    Status Achieved   125* with RUE 05/01/21               OT Long Term Goals - 05/09/21 1118       OT LONG TERM GOAL #1   Title Pt will be independent with updated HEP.-    Time 12    Period Weeks    Status On-going   Pt returned demonstration of red theraband HEP 05/09/21   Target Date 06/27/21      OT LONG TERM GOAL #2   Title Pt will load the washing machine modified independently    Time 12    Period Weeks    Status Achieved      OT LONG TERM GOAL #3   Title Pt will perfrom simple snack prep/ microwave cooking modified independently.    Time 12    Period Weeks    Status Achieved   met per pt report-05/09/21     OT LONG TERM GOAL #4   Title Pt will improve R hand strength  to at least 40 lbs for increased ease with opening containers.    Time 12    Period Weeks    Status Achieved   49.8 05/01/21     OT LONG TERM GOAL #5   Status --                   Plan - 05/09/21 1121     Clinical Impression Statement Pt has made good overall progress.She agrees with plans for d/c next visit.    OT Occupational Profile and History Problem Focused Assessment - Including review of records relating to presenting problem    Occupational performance deficits (Please refer to evaluation for details): ADL's;IADL's;Play;Leisure;Social Participation    Body Structure / Function / Physical Skills ADL;Balance;Mobility;Strength;Flexibility;UE functional use;FMC;Coordination;Gait;ROM;GMC;Decreased knowledge of precautions;Decreased knowledge of use of DME;Dexterity;IADL    Rehab Potential Good    Clinical Decision Making Limited treatment options, no task modification necessary    Comorbidities Affecting Occupational Performance: May have comorbidities impacting occupational performance    Modification or Assistance to Complete Evaluation  No modification of tasks or assist necessary to complete eval    OT Frequency 2x / week   anticipate d/c after * weeks however 12 week POC written to account for scheduling   OT Duration 12 weeks    OT  Treatment/Interventions Self-care/ADL training;Therapeutic exercise;Balance training;Functional Mobility Training;Ultrasound;Aquatic Therapy;Neuromuscular education;Manual Therapy;Therapeutic activities;Cryotherapy;Paraffin;DME and/or AE instruction;Fluidtherapy;Patient/family education;Passive range of motion;Moist Heat    Plan check remaining goal, review putty exercises, anticipate d/c    Consulted and Agree with Plan of Care Patient             Patient will benefit from skilled therapeutic intervention in order to improve the following deficits and impairments:   Body Structure / Function / Physical Skills: ADL, Balance, Mobility, Strength, Flexibility, UE functional use, FMC, Coordination, Gait, ROM, GMC, Decreased knowledge of precautions, Decreased knowledge of use of DME, Dexterity, IADL       Visit Diagnosis: Muscle weakness (generalized)  Unsteadiness on feet  Other abnormalities of gait and mobility  Other symptoms and signs involving the nervous system    Problem List Patient Active Problem List   Diagnosis Date Noted   Multiple sclerosis ([place]) 03/08/2021   Hypersomnia 01/12/2021   High risk medication use 01/06/2020   Vitamin D deficiency 01/06/2020   Normocytic anemia 09/26/2016   Hematuria    Urinary retention    Slow transit constipation    Yeast infection    Acute lower UTI    Other complicated headache syndrome    Acute blood loss anemia    Reactive hypertension    Multiple sclerosis exacerbation ([place]) 04/24/2016   Neurogenic bladder 04/24/2016   Weakness of both arms    Weakness of both legs    Sepsis ([place]) 04/21/2016   Bilateral hydronephrosis 04/21/2016   AKI (acute kidney injury) ([place]) 04/21/2016   Sepsis secondary to UTI ([place]) 04/20/2016   Attention deficit disorder 02/23/2016   Abnormality of gait 11/24/2015   Generalized weakness 11/24/2015   Urinary urgency 11/24/2015   UTI (lower urinary tract infection) 10/29/2015   Leukocytosis  10/29/2015   Nausea and vomiting 10/29/2015   MS (multiple sclerosis) ([place]) 10/26/2014    Jamale Spangler, OT 05/09/2021, 11:33 AM  [facility] [address] [place], [place], [address] Phone: [phone]   Fax:  [phone]  Name: Crystal Park MRN: [id number] Date of Birth: 01/02/1961

## 2021-05-09 NOTE — Therapy (Signed)
[facility] [address] [place], [place], [address] Phone: [phone]   Fax:  [phone]  Physical Therapy Treatment  Patient Details  Name: Crystal Park MRN: [id number] Date of Birth: 07-22-1960 Referring Provider (PT): Courtney Heys, MD   Encounter Date: 05/09/2021   PT End of Session - 05/09/21 1251     Visit Number 8    Number of Visits 17    Date for PT Re-Evaluation 05/30/21    Authorization Type [employer], VL combined with PT & OT: 75 days,    Authorization - Visit Number 7    Authorization - Number of Visits 41    PT Start Time 9390    PT Stop Time 1228    PT Time Calculation (min) 42 min    Equipment Utilized During Treatment Gait belt    Behavior During Therapy WFL for tasks assessed/performed             Past Medical History:  Diagnosis Date   Benign paroxysmal positional vertigo 10/12/2014   Constipation    Family history of breast cancer    Headache    history of migaines   History of kidney stones    History of MRSA infection    History of normocytic normochromic anemia    MS (multiple sclerosis) ([place])    diagnosed 9/16   Neurogenic bladder    self-caths   PONV (postoperative nausea and vomiting)     Past Surgical History:  Procedure Laterality Date   BREAST LUMPECTOMY Right    benign   COLONOSCOPY     CYSTOSCOPY WITH INJECTION N/A 04/23/2017   Procedure: CYSTOSCOPY WITH INJECTION/ BOTOX 200 UNITS;  Surgeon: Festus Aloe, MD;  Location: [facility];  Service: Urology;  Laterality: N/A;   OOPHORECTOMY Left    2013   TONSILLECTOMY AND ADENOIDECTOMY     age 17   TRANSURETHRAL RESECTION OF BLADDER TUMOR N/A 04/23/2017   Procedure: TRANSURETHRAL RESECTION OF BLADDER TUMOR (TURBT);  Surgeon: Festus Aloe, MD;  Location: [facility];  Service: Urology;  Laterality: N/A;    There were no vitals filed for this visit.   Subjective Assessment -  05/09/21 1148     Subjective Pt states she feels good about finishing up on Thursday.  She tried side-stepping at the counter with her husband and felt safer and that it was more do-able.  Her fatigue is around a 2-3/10 following OT.    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    Patient Stated Goals Wants to be able to walk better.    Currently in Pain? No/denies    Pain Onset 1 to 4 weeks ago                               [facility] Adult PT Treatment/Exercise - 05/09/21 1150       Neuro Re-ed    Neuro Re-ed Details  Pt standing with back flat against wall, cued for reach to target with butt against wall to prevent squatting.  Used to promote reach outside BOS.  Added backwards walking at countertop w/ supervision to HEP, reviewed in session x4 bouts.  Exercises   Exercises Other Exercises    Other Exercises  Pt in sitting performing forward reach to floor then target to facilitate core engagement and posture when reaching outside BOS to promote tolerance to activity.  PT progressed to placing and retrieving .5kg weighted balls on seat of rollator to facilitate increased core engagement.  Pt walks 4 laps in gym for promotion of activity tolerance.  Pt allowed to walk at normal speed with rollator to promote self-pacing of activity.                     PT Education - 05/09/21 1225     Education Details Edu on finalized HEP and modifications to accommodate safety and level of fatigue.  Continued edu on discharge Thursday with plans to reassess goals.    Person(s) Educated Patient    Methods Explanation    Comprehension Verbalized understanding              PT Short Term  Goals - 05/03/21 1627       PT SHORT TERM GOAL #1   Title Pt will be independent with initial HEP with family supervision.    Baseline 05/03/2021 Pt reports fear of completing standing portion of HEP d/t feeling of unsteadiness.    Time 4    Period Weeks    Status Not Met    Target Date 05/02/21      PT SHORT TERM GOAL #2   Title Pt will demo Berg Balance Score of 40/56 to demo return to prior level from before her hospitalization    Baseline 33/56 on 04/04/21; 44/56 on 04/28/21    Time 4    Period Weeks    Status Achieved    Target Date 05/02/21      PT SHORT TERM GOAL #3   Title Pt will improve 5x sit <> stand using UE support from chair to 25 seconds or less in order to demo improved functional BLE strength/decr fall risk.    Baseline 35.17 sec on 04/04/21; 20.69s on 04/28/21 w/BUE support    Time 4    Period Weeks    Status Achieved    Target Date 05/02/21      PT SHORT TERM GOAL #4   Title Pt will improve gait speed with rollator to at least 1.8 ft/sec in order to demo decr fall risk.    Baseline 1.46 ft/sec with rollator.  05/03/2021 2.15 ft/sec at fastest speed with rollator    Time 4    Period Weeks    Status Achieved      PT SHORT TERM GOAL #5   Title Pt will be able to tolerate ambulating at least 6 min without rest    Baseline Only tolerating 4 min 04/04/21 per pt report; 05/03/2021 Pt tolerates 6 min continuous ambulation with rollator.    Time 4    Period Weeks    Status Achieved    Target Date 05/02/21               PT Long Term Goals - 04/04/21 1040       PT LONG TERM GOAL #1   Title Pt will be independent with final HEP with family supervision in order to build upon functional gains made in therapy.    Time 8    Period Weeks    Status New    Target Date 05/30/21      PT LONG TERM GOAL #2   Title Pt will improve  BERG to at least a 46/56 in order to demo decr fall risk.    Baseline 33/56    Time 8    Period Weeks    Status New    Target Date  05/30/21      PT LONG TERM GOAL #3   Title Pt will improve 30 second chair stand to at least 7 sit <> stands using BUE support in order to demo improved strength/endurance.    Baseline 4 sit <> Stands on 04/04/21    Time 8    Period Weeks    Status New    Target Date 05/30/21      PT LONG TERM GOAL #4   Title Pt will perform TUG in 24 seconds or less with rollator vs. LRAD in order to demo decr fall risk.    Baseline 29.27 with rollator on 04/04/21    Time 8    Period Weeks    Status New    Target Date 05/30/21      PT LONG TERM GOAL #5   Title Pt will improve gait speed with rollator to at least 2.1 ft/sec in order to demo decr fall risk/improved efficiency with gait.    Baseline 1.42 ft/sec    Time 8    Period Weeks    Status New    Target Date 05/30/21      Additional Long Term Goals   Additional Long Term Goals Yes      PT LONG TERM GOAL #6   Title Pt will increase 2 MWT distance to at least 239' to demo [facility] for MS population    Baseline 176' on 04/04/21    Time 8    Period Weeks    Status New    Target Date 05/30/21                   Plan - 05/09/21 1217     Clinical Impression Statement Pt remains limited by decreased activity tolerance, but reports low level fatigue throughout session.  Focused on activities with forward lean to promote functional mobility and finalized HEP to prepare for discharge on 05/11/2021 as pt requested.    Personal Factors and Comorbidities Comorbidity 1;Time since onset of injury/illness/exacerbation;Behavior Pattern;Past/Current Experience    Comorbidities PMH: RRMS (diagnosed 10/2014)    Examination-Activity Limitations Carry;Lift;Locomotion Level;Squat;Stairs;Transfers;Stand;Reach Overhead    Examination-Participation Restrictions Cleaning;Community Activity;Laundry    Stability/Clinical Decision Making Evolving/Moderate complexity    Rehab Potential Good    PT Frequency 2x / week    PT Duration 8 weeks    PT  Treatment/Interventions ADLs/Self Care Home Management;Aquatic Therapy;DME Instruction;Stair training;Functional mobility training;Gait training;Balance training;Therapeutic activities;Neuromuscular re-education;Therapeutic exercise;Patient/family education;Passive range of motion;Vestibular;Manual techniques;Energy conservation;Taping    PT Next Visit Plan Reassess LTGs.  Discharge 05/11/21 per pt request.  Work of retrostepping.  Glut strengthening.  Progress HEP to standing activity if able. Cardiovascular endurance, EC and narrow BOS activities.    PT Home Exercise Plan Access Code: [email or web address]; Currently has walking program from acute care PT and upper body exercises    Consulted and Agree with Plan of Care Patient             Patient will benefit from skilled therapeutic intervention in order to improve the following deficits and impairments:  Abnormal gait, Decreased balance, Decreased activity tolerance, Decreased coordination, Decreased endurance, Decreased knowledge of use of DME, Decreased mobility, Decreased strength, Difficulty walking, Postural dysfunction, Pain  Visit Diagnosis: Muscle weakness (generalized)  Unsteadiness on feet  Other abnormalities of gait and mobility     Problem List Patient Active Problem List   Diagnosis Date Noted   Multiple sclerosis ([place]) 03/08/2021   Hypersomnia 01/12/2021   High risk medication use 01/06/2020   Vitamin D deficiency 01/06/2020   Normocytic anemia 09/26/2016   Hematuria    Urinary retention    Slow transit constipation    Yeast infection    Acute lower UTI    Other complicated headache syndrome    Acute blood loss anemia    Reactive hypertension    Multiple sclerosis exacerbation ([place]) 04/24/2016   Neurogenic bladder 04/24/2016   Weakness of both arms    Weakness of both legs    Sepsis ([place]) 04/21/2016   Bilateral hydronephrosis 04/21/2016   AKI (acute kidney injury) ([place]) 04/21/2016   Sepsis secondary to UTI ([place])  04/20/2016   Attention deficit disorder 02/23/2016   Abnormality of gait 11/24/2015   Generalized weakness 11/24/2015   Urinary urgency 11/24/2015   UTI (lower urinary tract infection) 10/29/2015   Leukocytosis 10/29/2015   Nausea and vomiting 10/29/2015   MS (multiple sclerosis) ([place]) 10/26/2014    Bary Richard, PT, DPT 05/09/2021, 12:59 PM  [facility] [address] [place], [place], [address] Phone: [phone]   Fax:  [phone]  Name: Crystal Park MRN: [id number] Date of Birth: 07-09-1960

## 2021-05-09 NOTE — Patient Instructions (Addendum)
Access Code: [email or web address] ?URL: [email or web address] ?Date: 05/09/2021 ?Prepared by: Camille Bal ? ?Exercises ?Seated Long Arc Quad - 2 x daily - 5 x weekly - 1-2 sets - 10 reps ?Seated Hip Adduction Isometrics with Ball - 2 x daily - 5 x weekly - 2 sets - 5 reps ?Seated Hip Abduction with Resistance - 2 x daily - 5 x weekly - 2 sets - 10 reps ?Sit to Stand with Armchair - 2 x daily - 5 x weekly - 1 sets - 5 reps ?Standing March with Counter Support - 2 x daily - 5 x weekly - 1 sets - 5 reps ?Side Stepping with Counter Support - 1 x daily - 5 x weekly - 4 sets ?Backward Walking with Counter Support - 1 x daily - 5 x weekly - 4 sets ?Sit to Stand with Resistance Around Legs - 1 x daily - 3 x weekly - 2 sets - 8 reps-edu on use of yellow band and progression to red band when yellow feels easy. ? ?

## 2021-05-11 ENCOUNTER — Ambulatory Visit: Payer: Managed Care, Other (non HMO) | Admitting: Occupational Therapy

## 2021-05-11 ENCOUNTER — Other Ambulatory Visit: Payer: Self-pay

## 2021-05-11 ENCOUNTER — Encounter: Payer: Self-pay | Admitting: Physical Therapy

## 2021-05-11 ENCOUNTER — Ambulatory Visit: Payer: Managed Care, Other (non HMO) | Admitting: Physical Therapy

## 2021-05-11 DIAGNOSIS — R2689 Other abnormalities of gait and mobility: Secondary | ICD-10-CM

## 2021-05-11 DIAGNOSIS — M6281 Muscle weakness (generalized): Secondary | ICD-10-CM | POA: Diagnosis not present

## 2021-05-11 DIAGNOSIS — R2681 Unsteadiness on feet: Secondary | ICD-10-CM

## 2021-05-11 NOTE — Therapy (Signed)
[facility] [address] [place], [place], [address] Phone: [phone]   Fax:  [phone]  Occupational Therapy Treatment  Patient Details  Name: Crystal Park MRN: [id number] Date of Birth: 1961-03-01 Referring Provider (OT): Dr. Dagoberto Ligas   Encounter Date: 05/11/2021   OT End of Session - 05/11/21 1128     Visit Number 9    Number of Visits 25    Date for OT Re-Evaluation 06/27/21    Authorization Type [employer]    Authorization Time Period 12 weeks    Authorization - Visit Number 8    Authorization - Number of Visits 75    OT Start Time 1100    OT Stop Time 1125    OT Time Calculation (min) 25 min    Activity Tolerance Patient tolerated treatment well    Behavior During Therapy [facility] for tasks assessed/performed             Past Medical History:  Diagnosis Date   Benign paroxysmal positional vertigo 10/12/2014   Constipation    Family history of breast cancer    Headache    history of migaines   History of kidney stones    History of MRSA infection    History of normocytic normochromic anemia    MS (multiple sclerosis) ([place])    diagnosed 9/16   Neurogenic bladder    self-caths   PONV (postoperative nausea and vomiting)     Past Surgical History:  Procedure Laterality Date   BREAST LUMPECTOMY Right    benign   COLONOSCOPY     CYSTOSCOPY WITH INJECTION N/A 04/23/2017   Procedure: CYSTOSCOPY WITH INJECTION/ BOTOX 200 UNITS;  Surgeon: Festus Aloe, MD;  Location: [facility];  Service: Urology;  Laterality: N/A;   OOPHORECTOMY Left    2013   TONSILLECTOMY AND ADENOIDECTOMY     age 50   TRANSURETHRAL RESECTION OF BLADDER TUMOR N/A 04/23/2017   Procedure: TRANSURETHRAL RESECTION OF BLADDER TUMOR (TURBT);  Surgeon: Festus Aloe, MD;  Location: [facility];  Service: Urology;  Laterality: N/A;    There were no vitals filed for this visit.   Subjective  Assessment - 05/11/21 1105     Subjective  Denies pain. I'm good w/ d/c today    Pertinent History MS, sepsis due to UTI, hx of vertigo    Patient Stated Goals improve RUE functional use    Currently in Pain? No/denies             See pt instructions for details on HEP. Pt issued Oval 8 splint to wear on thumb to prevent IP hyperextension with pinch strength ex (can wear on Rt thumb and Lt thumb depending on which hand she is using)                     OT Education - 05/11/21 1119     Education Details Issued putty HEP w/ clarifications    Person(s) Educated Patient    Methods Explanation;Demonstration;Verbal cues;Handout    Comprehension Verbalized understanding;Returned demonstration;Verbal cues required              OT Short Term Goals - 05/01/21 1209       OT SHORT TERM GOAL #1   Title Pt will be independent with initial HEP    Time 4    Period Weeks    Status Achieved    Target Date 05/02/21      OT SHORT TERM GOAL #  2   Title Pt will stand x 7 mins for ADL/functional tasks prior to rest break.    Baseline up to 5 minutes    Time 4    Period Weeks    Status Achieved   completed standing activities for 8 minutes 05/01/21     OT SHORT TERM GOAL #3   Title Pt will verbalize understanding of energy conservation strategies.    Time 4    Period Weeks    Status Achieved      OT SHORT TERM GOAL #4   Title Pt will verbalize understanding of adapted strategies/ AE to maximize safety and I with ADLs/ IADLs .    Time 4    Period Weeks    Status Achieved      OT SHORT TERM GOAL #5   Title Pt will retrieve a lightweight object at 125* shoulder flexion with RUE for increased functional use.    Baseline 120 shoulder flexion    Time 4    Period Weeks    Status Achieved   125* with RUE 05/01/21              OT Long Term Goals - 05/11/21 1128       OT LONG TERM GOAL #1   Title Pt will be independent with updated HEP.-    Time 12    Period  Weeks    Status Achieved   Pt returned demonstration of red theraband HEP 05/09/21, further clarifications made on 05/11/21   Target Date 06/27/21      OT LONG TERM GOAL #2   Title Pt will load the washing machine modified independently    Time 12    Period Weeks    Status Achieved      OT LONG TERM GOAL #3   Title Pt will perfrom simple snack prep/ microwave cooking modified independently.    Time 12    Period Weeks    Status Achieved   met per pt report-05/09/21     OT LONG TERM GOAL #4   Title Pt will improve R hand strength  to at least 40 lbs for increased ease with opening containers.    Time 12    Period Weeks    Status Achieved   49.8 05/01/21                  Plan - 05/11/21 1128     Clinical Impression Statement Pt has met all STG's and LTG's at this time and has made good progress overall.    OT Occupational Profile and History Problem Focused Assessment - Including review of records relating to presenting problem    Occupational performance deficits (Please refer to evaluation for details): ADL's;IADL's;Play;Leisure;Social Participation    Body Structure / Function / Physical Skills ADL;Balance;Mobility;Strength;Flexibility;UE functional use;FMC;Coordination;Gait;ROM;GMC;Decreased knowledge of precautions;Decreased knowledge of use of DME;Dexterity;IADL    Rehab Potential Good    Clinical Decision Making Limited treatment options, no task modification necessary    Comorbidities Affecting Occupational Performance: May have comorbidities impacting occupational performance    Modification or Assistance to Complete Evaluation  No modification of tasks or assist necessary to complete eval    OT Frequency 2x / week   anticipate d/c after * weeks however 12 week POC written to account for scheduling   OT Duration 12 weeks    OT Treatment/Interventions Self-care/ADL training;Therapeutic exercise;Balance training;Functional Mobility Training;Ultrasound;Aquatic  Therapy;Neuromuscular education;Manual Therapy;Therapeutic activities;Cryotherapy;Paraffin;DME and/or AE instruction;Fluidtherapy;Patient/family education;Passive range of motion;Moist Heat  Plan D/C O.Darene Lamer    Consulted and Agree with Plan of Care Patient             Patient will benefit from skilled therapeutic intervention in order to improve the following deficits and impairments:   Body Structure / Function / Physical Skills: ADL, Balance, Mobility, Strength, Flexibility, UE functional use, FMC, Coordination, Gait, ROM, GMC, Decreased knowledge of precautions, Decreased knowledge of use of DME, Dexterity, IADL       Visit Diagnosis: Muscle weakness (generalized)    Problem List Patient Active Problem List   Diagnosis Date Noted   Multiple sclerosis ([place]) 03/08/2021   Hypersomnia 01/12/2021   High risk medication use 01/06/2020   Vitamin D deficiency 01/06/2020   Normocytic anemia 09/26/2016   Hematuria    Urinary retention    Slow transit constipation    Yeast infection    Acute lower UTI    Other complicated headache syndrome    Acute blood loss anemia    Reactive hypertension    Multiple sclerosis exacerbation ([place]) 04/24/2016   Neurogenic bladder 04/24/2016   Weakness of both arms    Weakness of both legs    Sepsis ([place]) 04/21/2016   Bilateral hydronephrosis 04/21/2016   AKI (acute kidney injury) ([place]) 04/21/2016   Sepsis secondary to UTI ([place]) 04/20/2016   Attention deficit disorder 02/23/2016   Abnormality of gait 11/24/2015   Generalized weakness 11/24/2015   Urinary urgency 11/24/2015   UTI (lower urinary tract infection) 10/29/2015   Leukocytosis 10/29/2015   Nausea and vomiting 10/29/2015   MS (multiple sclerosis) ([place]) 10/26/2014   OCCUPATIONAL THERAPY DISCHARGE SUMMARY  Visits from Start of Care: 9  Current functional level related to goals / functional outcomes: See above - pt has met all goals   Remaining  deficits: Endurance Strength Decreased balance   Education / Equipment: HEP's, splint wear and care, energy conservation techniques   Patient agrees to discharge. Patient goals were met. Patient is being discharged due to meeting the stated rehab goals.Carey Bullocks, OTR/L 05/11/2021, 11:30 AM  [facility] [address] [place], [place], [address] Phone: [phone]   Fax:  [phone]  Name: Crystal Park MRN: [id number] Date of Birth: 08-24-1960

## 2021-05-11 NOTE — Patient Instructions (Signed)
?  1. Grip Strengthening (Resistive Putty) ? ? ?Squeeze red putty Rt hand. Repeat _15-20___ times. Then repeat 15-20 with Lt hand. Do __2__ sessions per day. ? ? ?2. Place thumb splint on finger before doing below exercise ?Roll putty into tube on table and pinch between first two fingers and thumb x 10 reps Rt hand, then Lt hand. Do 2 sessions per day ? ?(If you lose thumb splint, do #2 with yellow putty)  ? ? ? ?

## 2021-05-11 NOTE — Therapy (Signed)
[facility] [address] [place], [place], [address] Phone: [phone]   Fax:  [phone]  Physical Therapy Treatment/Discharge Summary PHYSICAL THERAPY DISCHARGE SUMMARY  Visits from Start of Care: 9  Current functional level related to goals / functional outcomes: Pt has met majority of LTGs, see assessment for details.   Remaining deficits: Pt remains limited by fatigue and cardiovascular endurance, decreased gait speed, and balance deficits particularly with low vision reliance.   Education / Equipment: Pt edu on finalized HEP, safety, functional status, goal outcomes and discharge status.   Patient agrees to discharge. Patient goals were  5/6 were met, 1/6 partially met . Patient is being discharged due to the patient's request.   Patient Details  Name: Crystal Park MRN: [id number] Date of Birth: 09-09-1960 Referring Provider (PT): Courtney Heys, MD   Encounter Date: 05/11/2021   PT End of Session - 05/11/21 1021     Visit Number 9    Number of Visits 17    Date for PT Re-Evaluation 05/30/21    Authorization Type [employer], VL combined with PT & OT: 75 days,    Authorization - Visit Number 7    Authorization - Number of Visits 75    PT Start Time 1020    PT Stop Time 1058    PT Time Calculation (min) 38 min    Equipment Utilized During Treatment Gait belt    Behavior During Therapy WFL for tasks assessed/performed             Past Medical History:  Diagnosis Date   Benign paroxysmal positional vertigo 10/12/2014   Constipation    Family history of breast cancer    Headache    history of migaines   History of kidney stones    History of MRSA infection    History of normocytic normochromic anemia    MS (multiple sclerosis) ([place])    diagnosed 9/16   Neurogenic bladder    self-caths   PONV (postoperative nausea and vomiting)     Past Surgical History:  Procedure Laterality Date    BREAST LUMPECTOMY Right    benign   COLONOSCOPY     CYSTOSCOPY WITH INJECTION N/A 04/23/2017   Procedure: CYSTOSCOPY WITH INJECTION/ BOTOX 200 UNITS;  Surgeon: Festus Aloe, MD;  Location: [facility];  Service: Urology;  Laterality: N/A;   OOPHORECTOMY Left    2013   TONSILLECTOMY AND ADENOIDECTOMY     age 89   TRANSURETHRAL RESECTION OF BLADDER TUMOR N/A 04/23/2017   Procedure: TRANSURETHRAL RESECTION OF BLADDER TUMOR (TURBT);  Surgeon: Festus Aloe, MD;  Location: [facility];  Service: Urology;  Laterality: N/A;    There were no vitals filed for this visit.   Subjective Assessment - 05/11/21 1024     Subjective Pt states HEP is good and she is still in agreement to discharge today.    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    Patient Stated Goals Wants to be able to walk better.    Currently in Pain? No/denies    Pain Onset 1 to 4 weeks ago                [facility] PT Assessment -  05/11/21 1027       Berg Balance Test   Sit to Stand Able to stand without using hands and stabilize independently    Standing Unsupported Able to stand safely 2 minutes    Sitting with Back Unsupported but Feet Supported on Floor or Stool Able to sit safely and securely 2 minutes    Stand to Sit Sits safely with minimal use of hands    Transfers Able to transfer safely, minor use of hands    Standing Unsupported with Eyes Closed Able to stand 10 seconds safely    Standing Unsupported with Feet Together Able to place feet together independently and stand 1 minute safely    From Standing, Reach Forward with Outstretched Arm Can reach forward >12 cm safely (5")    From Standing  Position, Pick up Object from Floor Able to pick up shoe, needs supervision    From Standing Position, Turn to Look Behind Over each Shoulder Looks behind from both sides and weight shifts well    Turn 360 Degrees Able to turn 360 degrees safely but slowly    Standing Unsupported, Alternately Place Feet on Step/Stool Able to stand independently and complete 8 steps >20 seconds    Standing Unsupported, One Foot in Front Able to plae foot ahead of the other independently and hold 30 seconds    Standing on One Leg Able to lift leg independently and hold equal to or more than 3 seconds   3 seconds on L, 4 seconds on R   Total Score 48                           [facility] Adult PT Treatment/Exercise - 05/11/21 1027       Ambulation/Gait   Ambulation/Gait Yes    Ambulation/Gait Assistance 5: Supervision    Ambulation/Gait Assistance Details Edu on decreased endurance without UE support of rollator.  Pt inquires about using cane at home as she has started as of 1 week ago.  Edu on proper use of quad cane and safety with familiar home environment.  Edu on using rollator in unlevel and unfamiliar environments, or far distances.    Ambulation Distance (Feet) 155 Feet    Assistive device Small based quad cane    Gait Pattern Step-through pattern;Decreased arm swing - left                     PT Education - 05/11/21 1103     Education Details Edu on continued use of HEP to maintain gains.  Pt inquires about use of quad cane at home as she has one that she began to use about a week ago due to feeling stronger.  PT provides gait training and edu on safety with quad cane.  Recommended use of rollator for long distances, unlevel and unfamiliar environments and only short trials with quad cane in home with supervision.    Person(s) Educated Patient    Methods Explanation    Comprehension Verbalized understanding              PT Short Term Goals - 05/03/21 1627       PT  SHORT TERM GOAL #1   Title Pt will be independent with initial HEP with family supervision.    Baseline 05/03/2021 Pt reports fear of completing standing portion of HEP d/t feeling of unsteadiness.    Time 4    Period Weeks    Status  Not Met    Target Date 05/02/21      PT SHORT TERM GOAL #2   Title Pt will demo Berg Balance Score of 40/56 to demo return to prior level from before her hospitalization    Baseline 33/56 on 04/04/21; 44/56 on 04/28/21    Time 4    Period Weeks    Status Achieved    Target Date 05/02/21      PT SHORT TERM GOAL #3   Title Pt will improve 5x sit <> stand using UE support from chair to 25 seconds or less in order to demo improved functional BLE strength/decr fall risk.    Baseline 35.17 sec on 04/04/21; 20.69s on 04/28/21 w/BUE support    Time 4    Period Weeks    Status Achieved    Target Date 05/02/21      PT SHORT TERM GOAL #4   Title Pt will improve gait speed with rollator to at least 1.8 ft/sec in order to demo decr fall risk.    Baseline 1.46 ft/sec with rollator.  05/03/2021 2.15 ft/sec at fastest speed with rollator    Time 4    Period Weeks    Status Achieved      PT SHORT TERM GOAL #5   Title Pt will be able to tolerate ambulating at least 6 min without rest    Baseline Only tolerating 4 min 04/04/21 per pt report; 05/03/2021 Pt tolerates 6 min continuous ambulation with rollator.    Time 4    Period Weeks    Status Achieved    Target Date 05/02/21               PT Long Term Goals - 05/11/21 1026       PT LONG TERM GOAL #1   Title Pt will be independent with final HEP with family supervision in order to build upon functional gains made in therapy.    Baseline Pt states she has completed all activities on HEP with husband present 05/11/2021.    Time 8    Period Weeks    Status Achieved    Target Date 05/30/21      PT LONG TERM GOAL #2   Title Pt will improve BERG to at least a 46/56 in order to demo decr fall risk.    Baseline 33/56;  48/56 05/11/2021    Time 8    Period Weeks    Status Achieved    Target Date 05/30/21      PT LONG TERM GOAL #3   Title Pt will improve 30 second chair stand to at least 7 sit <> stands using BUE support in order to demo improved strength/endurance.    Baseline 4 sit <> Stands on 04/04/21; 9 sit<>stands 05/11/2021    Time 8    Period Weeks    Status Achieved    Target Date 05/30/21      PT LONG TERM GOAL #4   Title Pt will perform TUG in 24 seconds or less with rollator vs. LRAD in order to demo decr fall risk.    Baseline 29.27 with rollator on 04/04/21;  17.17 sec with rollator 05/11/2021    Time 8    Period Weeks    Status Achieved    Target Date 05/30/21      PT LONG TERM GOAL #5   Title Pt will improve gait speed with rollator to at least 2.1 ft/sec in order to demo decr fall risk/improved efficiency with  gait.    Baseline 1.42 ft/sec; 2.23 ft/sec with rollator    Time 8    Period Weeks    Status Achieved    Target Date 05/30/21      PT LONG TERM GOAL #6   Title Pt will increase 2 MWT distance to at least 239' to demo [facility] for MS population    Baseline 176' on 04/04/21; 208' w/ rollator    Time 8    Period Weeks    Status Partially Met    Target Date 05/30/21                   Plan - 05/11/21 1106     Clinical Impression Statement PT to discharge pt today.  Pt is in agreement with plan as she has made good progress towards goals and has requested this to be her last visit.  Pt demonstrates progress during session meeting 5/6 goals while partially meeting 2MWT goal.  She has improved in her gait speed, STS, and static balance in order to promote LE strength and decrease fall risk.  She remains limited by low activity tolerance and cardiovascular endurance.  She is compliant to all parts of HEP with supervision of her husband.  PT emphasized importance of continued HEP use to maintain progress. Assessments adequate for discharge at this time.    Personal Factors and  Comorbidities Comorbidity 1;Time since onset of injury/illness/exacerbation;Behavior Pattern;Past/Current Experience    Comorbidities PMH: RRMS (diagnosed 10/2014)    Examination-Activity Limitations Carry;Lift;Locomotion Level;Squat;Stairs;Transfers;Stand;Reach Overhead    Examination-Participation Restrictions Cleaning;Community Activity;Laundry    Stability/Clinical Decision Making Evolving/Moderate complexity    Rehab Potential Good    PT Frequency 2x / week    PT Duration 8 weeks    PT Treatment/Interventions ADLs/Self Care Home Management;Aquatic Therapy;DME Instruction;Stair training;Functional mobility training;Gait training;Balance training;Therapeutic activities;Neuromuscular re-education;Therapeutic exercise;Patient/family education;Passive range of motion;Vestibular;Manual techniques;Energy conservation;Taping    PT Home Exercise Plan Access Code: [email or web address]; Currently has walking program from acute care PT and upper body exercises    Consulted and Agree with Plan of Care Patient             Patient will benefit from skilled therapeutic intervention in order to improve the following deficits and impairments:  Abnormal gait, Decreased balance, Decreased activity tolerance, Decreased coordination, Decreased endurance, Decreased knowledge of use of DME, Decreased mobility, Decreased strength, Difficulty walking, Postural dysfunction, Pain  Visit Diagnosis: Muscle weakness (generalized)  Unsteadiness on feet  Other abnormalities of gait and mobility     Problem List Patient Active Problem List   Diagnosis Date Noted   Multiple sclerosis ([place]) 03/08/2021   Hypersomnia 01/12/2021   High risk medication use 01/06/2020   Vitamin D deficiency 01/06/2020   Normocytic anemia 09/26/2016   Hematuria    Urinary retention    Slow transit constipation    Yeast infection    Acute lower UTI    Other complicated headache syndrome    Acute blood loss anemia    Reactive hypertension     Multiple sclerosis exacerbation ([place]) 04/24/2016   Neurogenic bladder 04/24/2016   Weakness of both arms    Weakness of both legs    Sepsis ([place]) 04/21/2016   Bilateral hydronephrosis 04/21/2016   AKI (acute kidney injury) ([place]) 04/21/2016   Sepsis secondary to UTI ([place]) 04/20/2016   Attention deficit disorder 02/23/2016   Abnormality of gait 11/24/2015   Generalized weakness 11/24/2015   Urinary urgency 11/24/2015   UTI (lower urinary tract infection) 10/29/2015  Leukocytosis 10/29/2015   Nausea and vomiting 10/29/2015   MS (multiple sclerosis) ([place]) 10/26/2014    Bary Richard, PT, DPT 05/11/2021, 1:29 PM  [facility] [address] [place], [place], [address] Phone: [phone]   Fax:  [phone]  Name: TYESE FINKEN MRN: [id number] Date of Birth: 05-04-1960

## 2021-05-16 ENCOUNTER — Ambulatory Visit: Payer: Managed Care, Other (non HMO) | Admitting: Physical Therapy

## 2021-05-16 ENCOUNTER — Ambulatory Visit: Payer: Managed Care, Other (non HMO) | Admitting: Occupational Therapy

## 2021-05-17 ENCOUNTER — Ambulatory Visit: Payer: Medicare Other | Admitting: Neurology

## 2021-05-18 ENCOUNTER — Ambulatory Visit: Payer: Managed Care, Other (non HMO) | Admitting: Physical Therapy

## 2021-05-18 ENCOUNTER — Ambulatory Visit: Payer: Managed Care, Other (non HMO) | Admitting: Occupational Therapy

## 2021-05-23 ENCOUNTER — Ambulatory Visit: Payer: Managed Care, Other (non HMO) | Admitting: Occupational Therapy

## 2021-05-23 ENCOUNTER — Ambulatory Visit: Payer: Managed Care, Other (non HMO) | Admitting: Physical Therapy

## 2021-05-25 ENCOUNTER — Encounter: Payer: Managed Care, Other (non HMO) | Admitting: Occupational Therapy

## 2021-05-25 ENCOUNTER — Ambulatory Visit: Payer: Managed Care, Other (non HMO) | Admitting: Physical Therapy

## 2021-05-30 ENCOUNTER — Ambulatory Visit: Payer: Managed Care, Other (non HMO) | Admitting: Physical Therapy

## 2021-05-30 ENCOUNTER — Encounter: Payer: Managed Care, Other (non HMO) | Admitting: Occupational Therapy

## 2021-06-01 ENCOUNTER — Encounter: Payer: Managed Care, Other (non HMO) | Admitting: Occupational Therapy

## 2021-06-01 ENCOUNTER — Ambulatory Visit: Payer: Managed Care, Other (non HMO) | Admitting: Physical Therapy

## 2021-07-05 ENCOUNTER — Telehealth: Payer: Self-pay | Admitting: Neurology

## 2021-07-05 NOTE — Telephone Encounter (Signed)
LVM and sent mychart message asking pt to call back and r/s 5/15 appt- Dr. Sater out of the office. ?

## 2021-07-17 ENCOUNTER — Ambulatory Visit: Payer: Medicare Other | Admitting: Neurology

## 2021-08-09 ENCOUNTER — Ambulatory Visit (INDEPENDENT_AMBULATORY_CARE_PROVIDER_SITE_OTHER): Payer: Managed Care, Other (non HMO) | Admitting: Neurology

## 2021-08-09 ENCOUNTER — Encounter: Payer: Self-pay | Admitting: Neurology

## 2021-08-09 VITALS — BP 111/81 | HR 88 | Ht 66.0 in | Wt 133.0 lb

## 2021-08-09 DIAGNOSIS — R339 Retention of urine, unspecified: Secondary | ICD-10-CM

## 2021-08-09 DIAGNOSIS — R29898 Other symptoms and signs involving the musculoskeletal system: Secondary | ICD-10-CM

## 2021-08-09 DIAGNOSIS — R5383 Other fatigue: Secondary | ICD-10-CM

## 2021-08-09 DIAGNOSIS — G35 Multiple sclerosis: Secondary | ICD-10-CM

## 2021-08-09 DIAGNOSIS — R269 Unspecified abnormalities of gait and mobility: Secondary | ICD-10-CM

## 2021-08-09 DIAGNOSIS — Z79899 Other long term (current) drug therapy: Secondary | ICD-10-CM | POA: Diagnosis not present

## 2021-08-09 NOTE — Progress Notes (Signed)
[facility]  PATIENT: Crystal Park DOB: 1960-09-22  REFERRING DOCTOR OR PCP:  Narda Bonds (ENT referred)   [name] (PCP) SOURCE: paitent and records from Dr. Ezzard Standing.    CT images on PACS  _________________________________   HISTORICAL  CHIEF COMPLAINT:  Chief Complaint  Patient presents with   Follow-up    RM 2 with husband. Last seen 01/12/21/ MS DMT: dimethyl fumarate. Ambulated with cane. May be having more stomach issues/uses pepto bismol more. Had episode where she got dehydrated from being sick/admitted at hospital over New years.     HISTORY OF PRESENT ILLNESS:  Crystal Park is a 61 y.o. woman with relapsing remitting multiple sclerosis.   Update 08/09/2021: She is on DMF and tolerates it ok with some GI issues..   She did no thave GI issues when she started.   Appetite is ok now.     She was hospitalized 03/04/2021 with a URI accompanied by N/V and became dehydrated.   She also had a UTI.  She was on the acute side of the hospital for a week and then did a couple weeks of inpt and then outpt PT/OT.   Symptoms improved and she is back to her late 2022 baseline.     With a cane, she can go 100 feet.    Ampyra did not help.     Both legs are weak, right > left and she needs to lift the right leg with arms when she gets into the car . Balance is more of a problem than strength.   If she bends over, she often just falls.        Arms are strong.  No major issues with hand coordination      She has urinary retention and she needs to does self caths about every 4-5 hours.   Also on oxybutynin for spasms..   She had a UTI 03/04/2021.    Vision is fine.     She has fatigue that is physical > cognitive.  She takes naps every day (more than last year).    She has a light snore but no OSA signs.     She has tried bed yoga but does not do more physical exercises.      She also has difficulty with focus, attention and short term fatigue.   She denies  difficulty with word finding.   Adderall, armodafinil and amantadine had not helped her.  She sleeps ok (has to wake up to cath 3-4 am and then wakes up at  am to take her morning medication but can fall back asleep and sleeps a few more hours).  Overall 12-14 hours a day/sleep.   She denies depression but has anxiety.   A friend passed away.      MS History:   About 2010, she had an event with vertigo and diplopia that took 6 months to improve.  After that she began to have issues with her legs.   I have reviewed the MRI of the brain and the spinal cord.   The MRI of the spine shows several T2 weighted lesions, some peripherally located consistent with multiple sclerosis. None of the foci enhanced. The MRI of the brain shows multiple foci in the periventricular, juxtacortical cortical and deep white matter consistent with multiple sclerosis.   There is also a left cerebellar focus that could represent remote stroke (it is also present on CT scan from 2010).  Her last definite exacerbation was 2016 involving gait/balance.      IMAGING MRI brain, cervical and thoracic spine 03/05/2021.were unchanged.   MRI brain 07/06/2020 showed Multiple T2/FLAIR hyperintense foci in the hemispheres and brainstem in a pattern and configuration consistent with chronic demyelinating plaque associated with multiple sclerosis.  None of the foci appear to be acute and they do not enhance.  Compared to the MRI from 01/23/2019, there are no new lesions.        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 MRI of the brain 01/23/2019 was unchanged.      MRI Brain 01/13/2020 unchanged compared to 01/23/2019  MRI of the brain 02/15/2018 showed multiple T2/flair hyperintense foci in the hemispheres, thalamus, brainstem in a pattern and configuration consistent with chronic demyelinating plaque associated with multiple sclerosis.  None of  the foci appears to be acute.  When compared to the MRI dated 04/21/2016, there is no definite interval change.   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   Normal enhancement pattern and no acute findings.  MRI of the cervical spine 04/24/2016 showed Multiple T2 hyperintense cervical cord lesions are stable in comparison with the prior cervical MRI new with the largest lesions at the C2, C4 and C7-T1 levels. No abnormal enhancement to suggest active demyelination.   No changes compared to the 10/29/2015 cervical spine MRI.  MRI of the thoracic spine 04/21/2016 showed Increased confluence of abnormal thoracic spinal cord signal from the T3 through the T5 level (series 6, image 7). Cord involvement here various from the right hemicord at T3 - to the central and left hemi cord at T4 - to nearly holocord involvement at T5 (series 8, image 13). Progressed and near holo cord signal abnormality also at T7 (series 6, image 8). Similar progressed thoracic cord signal abnormality at T9 and T10 (series 6, images 9 and 10).  Other patchy thoracic spinal cord heterogeneity, including at T8-T9 and also just above the conus at T12-L1, appears stable. No thoracic cord expansion.    There was felt to be progression compared to the August 2017 MRI  REVIEW OF SYSTEMS: Constitutional: No fevers, chills, sweats, or change in appetite.   She notes fatigue Eyes: No visual changes, double vision, eye pain Ear, nose and throat: No hearing loss, ear pain, nasal congestion, sore throat.   Has vertigo Cardiovascular: No chest pain, palpitations Respiratory:  No shortness of breath at rest or with exertion.   No wheezes GastrointestinaI: No nausea, vomiting, diarrhea, abdominal pain, fecal incontinence Genitourinary:  See above. She has not had any recent UTI.Marland Kitchen Musculoskeletal:  No neck pain, back pain Integumentary: No rash, pruritus, skin  lesions Neurological: as above Psychiatric: No depression at this time.  Mild anxiety Endocrine: No palpitations, diaphoresis, change in appetite, change in weigh or increased thirst Hematologic/Lymphatic:  No anemia, purpura, petechiae. Allergic/Immunologic: No itchy/runny eyes, nasal congestion, recent allergic reactions, rashes  ALLERGIES: Allergies  Allergen Reactions   Adderall [Amphetamine-Dextroamphetamine] Other (See Comments)    constipation    HOME MEDICATIONS:  Current Outpatient Medications:    acetaminophen (TYLENOL) 325 MG tablet, Take 1-2 tablets (325-650 mg total) by mouth every 4 (four) hours as needed for mild pain., Disp: , Rfl:    alendronate (FOSAMAX) 70 MG tablet, Take 70 mg by mouth once a week. Take with a full glass of water  on an empty stomach., Disp: , Rfl:    aspirin-acetaminophen-caffeine (EXCEDRIN MIGRAINE) 250-250-65 MG tablet, Take 1 tablet by mouth every 6 (six) hours as needed for headache., Disp: 10 tablet, Rfl: 0   bismuth subsalicylate (PEPTO BISMOL) 262 MG chewable tablet, Chew 524 mg by mouth as needed for indigestion or diarrhea or loose stools., Disp: , Rfl:    CRANBERRY PO, Take 1 capsule by mouth daily., Disp: , Rfl:    diazepam (VALIUM) 5 MG tablet, Take 1 tablet (5 mg total) by mouth 3 (three) times daily as needed (dizziness)., Disp: 20 tablet, Rfl: 0   Dimethyl Fumarate 240 MG CPDR, Take 1 capsule (240 mg total) by mouth 2 (two) times daily., Disp: 60 capsule, Rfl: 0   docusate sodium (COLACE) 100 MG capsule, 1 capsule, Disp: , Rfl:    levothyroxine (SYNTHROID) 88 MCG tablet, Take 1 tablet (88 mcg total) by mouth daily before breakfast., Disp: 30 tablet, Rfl: 0   Multiple Vitamin (MULTIVITAMIN) tablet, Take 1 tablet by mouth daily., Disp: , Rfl:    oxybutynin (DITROPAN) 5 MG tablet, Take 5 mg by mouth daily., Disp: , Rfl:    sertraline (ZOLOFT) 50 MG tablet, 1/2 tablet for 7 days, then increase to 1 tablet, Disp: , Rfl:   PAST MEDICAL  HISTORY: Past Medical History:  Diagnosis Date   Benign paroxysmal positional vertigo 10/12/2014   Constipation    Family history of breast cancer    Headache    history of migaines   History of kidney stones    History of MRSA infection    History of normocytic normochromic anemia    MS (multiple sclerosis) (HCC)    diagnosed 9/16   Neurogenic bladder    self-caths   PONV (postoperative nausea and vomiting)     PAST SURGICAL HISTORY: Past Surgical History:  Procedure Laterality Date   BREAST LUMPECTOMY Right    benign   COLONOSCOPY     CYSTOSCOPY WITH INJECTION N/A 04/23/2017   Procedure: CYSTOSCOPY WITH INJECTION/ BOTOX 200 UNITS;  Surgeon: Jerilee Field, MD;  Location: [facility];  Service: Urology;  Laterality: N/A;   OOPHORECTOMY Left    2013   TONSILLECTOMY AND ADENOIDECTOMY     age 64   TRANSURETHRAL RESECTION OF BLADDER TUMOR N/A 04/23/2017   Procedure: TRANSURETHRAL RESECTION OF BLADDER TUMOR (TURBT);  Surgeon: Jerilee Field, MD;  Location: [facility];  Service: Urology;  Laterality: N/A;    FAMILY HISTORY: Family History  Problem Relation Age of Onset   Cancer Mother    Cancer Father    Diabetes Sister     SOCIAL HISTORY:  Social History   Socioeconomic History   Marital status: Married    Spouse name: Not on file   Number of children: Not on file   Years of education: Not on file   Highest education level: Not on file  Occupational History   Occupation: disabled  Tobacco Use   Smoking status: Never   Smokeless tobacco: Never  Vaping Use   Vaping Use: Never used  Substance and Sexual Activity   Alcohol use: Not Currently    Comment: rare   Drug use: No   Sexual activity: Not Currently  Other Topics Concern   Not on file  Social History Narrative   Not on file   Social Determinants of Health   Financial Resource Strain: Not on file  Food Insecurity: Not on file  Transportation Needs: Not on file   Physical  Activity: Not on file  Stress: Not on file  Social Connections: Not on file  Intimate Partner Violence: Not on file     PHYSICAL EXAM  Vitals:   08/09/21 0948  BP: 111/81  Pulse: 88  Weight: 133 lb (60.3 kg)  Height: 5\' 6"  (1.676 m)    Body mass index is 21.47 kg/m.   General: The patient is well-developed and well-nourished and in no acute distress.   Hearing is symmetric.  Mild right ankle edema.  Neurologic Exam  Mental status: The patient is alert and oriented x 3 at the time of the examination. The patient has apparent normal recent and remote memory, with an apparently normal attention span and concentration ability.   Speech is normal.  Cranial nerves: Extraocular movements shows right nystagmus to right gaze with mildly reduced ability to abduct the right eye and milder nystagmus when she looks to the left.. There is mildly reduced color vision OS.  Facial strength and sensation is normal. Trapezius strength is strong   Motor:  Muscle bulk is normal.   Increased muscle tone in the legs, right greater than left.  Strength was 5/5 in the arms.  However, rapid alternating movements were mildly reduced on the left hand.  Strength was 4/5 in left leg and 3/5 in the right iliopsoas and EHL and 4-/5 elsewhere in the right leg  Sensory: She has normal sensation to touch and vibration in arms but reduced right vibration sense in leg  Coordination: Cerebellar testing shows fairly good finger-nose-finger and reduced heel-to-shin, poor on right.  Gait and station: She is able to rise up from a chair using one arm for support and can stand without support.  She has foot drop, right greater than left.  There is mild ataxia.  She cannot do a tandem walk..  Romberg negative.  Reflexes: Deep tendon reflexes are symmetric and 3+ bilaterally  and increased in the knees (with spread, worse right).  No ankle clonus..          ASSESSMENT AND PLAN  Multiple sclerosis (HCC) -  Plan: CBC with Differential/Platelet  High risk medication use - Plan: CBC with Differential/Platelet  Other fatigue  Urinary retention  Poor fine motor skills  Gait disturbance   1.   Continue Tecfidera.  Check the lymphocyte count. 2.    Stay active and exercises tolerated.   Continue exercises learned in PT 3.    We had a long discussion about the transition from relapse remitting to secondary progressive MS.  She is showing some progression in the absence of new MRI lesions.  We also discussed some research efforts underway including stem cells (hematopoietic versus mesenchymal) and some of the newer agents and clinical trials including the PTK inhibitors and the CD40L inhibitors.  Those medications are not available outside of drug studies at this time.   I will see her back 6 months or sooner if she has new or worsening symptoms.    45-minute office visit with the majority of the time spent face-to-face for history and physical, discussion/counseling and decision-making.  Additional time with record review and documentation.  Adedamola Seto A. Epimenio Foot, MD, PhD 08/10/2021, 4:58 PM Certified in Neurology, Clinical Neurophysiology, Sleep Medicine, Pain Medicine and Neuroimaging  [facility] [address] [place], [place] [address] [phone]

## 2021-08-10 LAB — CBC WITH DIFFERENTIAL/PLATELET
Basophils Absolute: 0 10*3/uL (ref 0.0–0.2)
Basos: 1 %
EOS (ABSOLUTE): 0.1 10*3/uL (ref 0.0–0.4)
Eos: 2 %
Hematocrit: 41 % (ref 34.0–46.6)
Hemoglobin: 14.3 g/dL (ref 11.1–15.9)
Immature Grans (Abs): 0 10*3/uL (ref 0.0–0.1)
Immature Granulocytes: 0 %
Lymphocytes Absolute: 1.8 10*3/uL (ref 0.7–3.1)
Lymphs: 32 %
MCH: 32.6 pg (ref 26.6–33.0)
MCHC: 34.9 g/dL (ref 31.5–35.7)
MCV: 93 fL (ref 79–97)
Monocytes Absolute: 0.6 10*3/uL (ref 0.1–0.9)
Monocytes: 10 %
Neutrophils Absolute: 3.1 10*3/uL (ref 1.4–7.0)
Neutrophils: 55 %
Platelets: 319 10*3/uL (ref 150–450)
RBC: 4.39 x10E6/uL (ref 3.77–5.28)
RDW: 12.3 % (ref 11.7–15.4)
WBC: 5.7 10*3/uL (ref 3.4–10.8)

## 2021-09-06 ENCOUNTER — Other Ambulatory Visit: Payer: Self-pay | Admitting: Neurology

## 2021-09-06 MED ORDER — DIAZEPAM 5 MG PO TABS
5.0000 mg | ORAL_TABLET | Freq: Three times a day (TID) | ORAL | 0 refills | Status: DC | PRN
Start: 2021-09-06 — End: 2022-06-02

## 2021-09-06 NOTE — Telephone Encounter (Signed)
Pt's husband, Jernee Murtaugh request refill for diazepam (VALIUM) 5 MG tablet at [facility]  Pt's husband said Dr. Epimenio Foot prescribed originally.

## 2021-09-06 NOTE — Telephone Encounter (Signed)
Reviewed pt chart. Last seen 08/09/21 and next f/u 02/15/22.  Diazepam 5mg  refill last sent in by , PA-C on 03/22/21 #20. No refills showing on drug registry.

## 2021-09-08 ENCOUNTER — Encounter: Payer: Self-pay | Admitting: Podiatry

## 2021-09-08 ENCOUNTER — Ambulatory Visit (INDEPENDENT_AMBULATORY_CARE_PROVIDER_SITE_OTHER): Payer: Managed Care, Other (non HMO) | Admitting: Podiatry

## 2021-09-08 DIAGNOSIS — B351 Tinea unguium: Secondary | ICD-10-CM | POA: Diagnosis not present

## 2021-09-08 DIAGNOSIS — L600 Ingrowing nail: Secondary | ICD-10-CM

## 2021-09-08 NOTE — Patient Instructions (Signed)

## 2021-09-09 NOTE — Progress Notes (Signed)
Subjective:   Patient ID: Crystal Park, female   DOB: 61 y.o.   MRN: [id number]   HPI Patient presents with husband with severely damaged big toenail second toenail right patient has had long-term history of multiple sclerosis and does not walk normally and probably has created trauma.  The other nails are okay but there is some trauma also to those nailbeds.  Patient does have attempted activities and does not smoke   Review of Systems  All other systems reviewed and are negative.       Objective:  Physical Exam Vitals and nursing note reviewed.  Constitutional:      Appearance: Crystal Park is well-developed.  Pulmonary:     Effort: Pulmonary effort is normal.  Musculoskeletal:        General: Normal range of motion.  Skin:    General: Skin is warm.  Neurological:     Mental Status: Crystal Park is alert.     Neurovascular status found to be intact muscle strength was found to be adequate range of motion adequate.  Patient does have muscle strength loss and weakness range of motion adequate and does have nail disease of all nails with some yellow discoloration but the hallux and second nails right are very thickened dystrophic and they are painful when palpated with abnormal growth.  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  Due to the severe abnormality of these nailbeds I do think removal would be best patient wants to have this done and I did explain what would be required going over with them the complications alternative treatments and the fact that this will be permanent.  Patient is willing to accept this wants the surgery and signed consent form and I infiltrated the right hallux second toe 120 mg like Marcaine mixture sterile prep done and using sterile instrumentation removed the  hallux and second nailbeds exposed matrix applied phenol 5 applications 30 seconds followed by alcohol lavage sterile dressing gave instructions on soaks and reappoint as needed and leave dressing on 24 hours but take it off if throbbing.  Encouraged to call questions concerns

## 2021-09-25 ENCOUNTER — Encounter: Payer: Managed Care, Other (non HMO) | Admitting: Physical Medicine and Rehabilitation

## 2022-01-12 ENCOUNTER — Other Ambulatory Visit: Payer: Self-pay | Admitting: Neurology

## 2022-01-12 DIAGNOSIS — G35 Multiple sclerosis: Secondary | ICD-10-CM

## 2022-02-15 ENCOUNTER — Ambulatory Visit: Payer: Medicare Other | Admitting: Neurology

## 2022-04-02 ENCOUNTER — Ambulatory Visit (INDEPENDENT_AMBULATORY_CARE_PROVIDER_SITE_OTHER): Payer: Managed Care, Other (non HMO) | Admitting: Neurology

## 2022-04-02 ENCOUNTER — Encounter: Payer: Self-pay | Admitting: Neurology

## 2022-04-02 VITALS — BP 99/79 | HR 100 | Ht 65.0 in | Wt 122.5 lb

## 2022-04-02 DIAGNOSIS — R339 Retention of urine, unspecified: Secondary | ICD-10-CM

## 2022-04-02 DIAGNOSIS — Z79899 Other long term (current) drug therapy: Secondary | ICD-10-CM | POA: Diagnosis not present

## 2022-04-02 DIAGNOSIS — G35 Multiple sclerosis: Secondary | ICD-10-CM

## 2022-04-02 DIAGNOSIS — G4710 Hypersomnia, unspecified: Secondary | ICD-10-CM

## 2022-04-02 DIAGNOSIS — R5383 Other fatigue: Secondary | ICD-10-CM | POA: Diagnosis not present

## 2022-04-02 DIAGNOSIS — R269 Unspecified abnormalities of gait and mobility: Secondary | ICD-10-CM

## 2022-04-02 NOTE — Progress Notes (Signed)
[facility]  PATIENT: Crystal Park DOB: Jun 09, 1960  REFERRING DOCTOR OR PCP:  Narda Bonds (ENT referred)   [name] (PCP) SOURCE: paitent and records from Dr. Ezzard Standing.    CT images on PACS  _________________________________   HISTORICAL  CHIEF COMPLAINT:  Chief Complaint  Patient presents with   Room 11    Pt is here with her Husband. Pt states that she had 2 falls since last visit due to muscle weakness.     HISTORY OF PRESENT ILLNESS:  Crystal Park is a 62 y.o. woman with relapsing remitting multiple sclerosis.   Update 04/02/2022: She is on DMF and tolerates it fairly well - mild GI issues some days.   We had also discussed other DMTs in past.   She had 2 falls the last 6 weeks - one occurring while using a cane and turning around some boxes. The second just happened while in bathroom using her walker and the walker got away from her.     She uses a Rollator and can go 200 feet.   She has not been using the cane much due ti the falls.C[place] Park did not help.     Both legs are weak, right > left and she needs to lift the right leg with arms when she gets into the car . Balance is more of a problem than strength.   If she bends over, she often just falls.        Arms are strong.  No major issues with hand coordination      She has urinary retention and she needs to does self caths about every 4-5 hours.   Also on oxybutynin for spasms..   She had a UTI 03/04/2021.    Vision is fine.     She has fatigue that is physical > cognitive.  She takes naps every day (more than last year).    She has a light snore but no OSA signs.     She has tried bed yoga but does not do more physical exercises.      She also has difficulty with focus, attention and short term fatigue.   She denies difficulty with word finding.   Adderall, armodafinil and amantadine had not helped her.  She sleeps ok (has to wake up to cath 3-4 am and then wakes up at  am to take her  morning medication but can fall back asleep and sleeps a few more hours).  Overall 12-14 hours a day/sleep.    She does not feel rested when she wakes up.  Light snoring at times but no OSA signs.    She denies depression but has anxiety.   A friend passed away.     She had COVID-19 November   MS History:   About 2010, she had an event with vertigo and diplopia that took 6 months to improve.  After that she began to have issues with her legs.  She was diagnosed in 2016 after weakness and abnormal signalling.   I have reviewed the MRI of the brain and the spinal cord.   The MRI of the spine shows several T2 weighted lesions, some peripherally located consistent with multiple sclerosis. None of the foci enhanced. The MRI of the brain shows multiple foci in the periventricular, juxtacortical cortical and deep white matter consistent with multiple sclerosis.   There is also a left cerebellar focus that could represent remote stroke (it is also present  on CT scan from 2010).      Her last definite exacerbation was 2016 involving gait/balance.      IMAGING MRI brain, cervical and thoracic spine 03/05/2021.were unchanged.   MRI brain 07/06/2020 showed Multiple T2/FLAIR hyperintense foci in the hemispheres and brainstem in a pattern and configuration consistent with chronic demyelinating plaque associated with multiple sclerosis.  None of the foci appear to be acute and they do not enhance.  Compared to the MRI from 01/23/2019, there are no new lesions.        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 MRI of the brain 01/23/2019 was unchanged.      MRI Brain 01/13/2020 unchanged compared to 01/23/2019  MRI of the brain 02/15/2018 showed multiple T2/flair hyperintense foci in the hemispheres, thalamus, brainstem in a pattern and configuration consistent with chronic demyelinating plaque associated with multiple  sclerosis.  None of the foci appears to be acute.  When compared to the MRI dated 04/21/2016, there is no definite interval change.   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   Normal enhancement pattern and no acute findings.  MRI of the cervical spine 04/24/2016 showed Multiple T2 hyperintense cervical cord lesions are stable in comparison with the prior cervical MRI new with the largest lesions at the C2, C4 and C7-T1 levels. No abnormal enhancement to suggest active demyelination.   No changes compared to the 10/29/2015 cervical spine MRI.  MRI of the thoracic spine 04/21/2016 showed Increased confluence of abnormal thoracic spinal cord signal from the T3 through the T5 level (series 6, image 7). Cord involvement here various from the right hemicord at T3 - to the central and left hemi cord at T4 - to nearly holocord involvement at T5 (series 8, image 13). Progressed and near holo cord signal abnormality also at T7 (series 6, image 8). Similar progressed thoracic cord signal abnormality at T9 and T10 (series 6, images 9 and 10).  Other patchy thoracic spinal cord heterogeneity, including at T8-T9 and also just above the conus at T12-L1, appears stable. No thoracic cord expansion.    There was felt to be progression compared to the August 2017 MRI  REVIEW OF SYSTEMS: Constitutional: No fevers, chills, sweats, or change in appetite.   She notes fatigue Eyes: No visual changes, double vision, eye pain Ear, nose and throat: No hearing loss, ear pain, nasal congestion, sore throat.   Has vertigo Cardiovascular: No chest pain, palpitations Respiratory:  No shortness of breath at rest or with exertion.   No wheezes GastrointestinaI: No nausea, vomiting, diarrhea, abdominal pain, fecal incontinence Genitourinary:  See above. She has not had any recent UTI.Marland Kitchen Musculoskeletal:  No neck pain, back pain Integumentary: No  rash, pruritus, skin lesions Neurological: as above Psychiatric: No depression at this time.  Mild anxiety Endocrine: No palpitations, diaphoresis, change in appetite, change in weigh or increased thirst Hematologic/Lymphatic:  No anemia, purpura, petechiae. Allergic/Immunologic: No itchy/runny eyes, nasal congestion, recent allergic reactions, rashes  ALLERGIES: Allergies  Allergen Reactions   Adderall [Amphetamine-Dextroamphetamine] Other (See Comments)    constipation    HOME MEDICATIONS:  Current Outpatient Medications:    acetaminophen (TYLENOL) 325 MG tablet, Take 1-2 tablets (325-650 mg total) by mouth every 4 (four) hours as needed for mild pain., Disp: , Rfl:    aspirin-acetaminophen-caffeine (EXCEDRIN MIGRAINE) 250-250-65 MG tablet, Take 1 tablet by  mouth every 6 (six) hours as needed for headache., Disp: 10 tablet, Rfl: 0   bismuth subsalicylate (PEPTO BISMOL) 262 MG chewable tablet, Chew 524 mg by mouth as needed for indigestion or diarrhea or loose stools., Disp: , Rfl:    CRANBERRY PO, Take 1 capsule by mouth daily., Disp: , Rfl:    diazepam (VALIUM) 5 MG tablet, Take 1 tablet (5 mg total) by mouth 3 (three) times daily as needed (dizziness)., Disp: 270 tablet, Rfl: 0   Dimethyl Fumarate 240 MG CPDR, TAKE 1 CAPSULE TWICE A DAY, Disp: 60 capsule, Rfl: 0   levothyroxine (SYNTHROID) 88 MCG tablet, Take 1 tablet (88 mcg total) by mouth daily before breakfast., Disp: 30 tablet, Rfl: 0   Multiple Vitamin (MULTIVITAMIN) tablet, Take 1 tablet by mouth daily., Disp: , Rfl:    oxybutynin (DITROPAN) 5 MG tablet, Take 5 mg by mouth daily., Disp: , Rfl:    sertraline (ZOLOFT) 50 MG tablet, 1/2 tablet for 7 days, then increase to 1 tablet, Disp: , Rfl:    alendronate (FOSAMAX) 70 MG tablet, Take 70 mg by mouth once a week. Take with a full glass of water on an empty stomach. (Patient not taking: Reported on 04/02/2022), Disp: , Rfl:    docusate sodium (COLACE) 100 MG capsule, 1 capsule  (Patient not taking: Reported on 04/02/2022), Disp: , Rfl:   PAST MEDICAL HISTORY: Past Medical History:  Diagnosis Date   Benign paroxysmal positional vertigo 10/12/2014   Constipation    Family history of breast cancer    Headache    history of migaines   History of kidney stones    History of MRSA infection    History of normocytic normochromic anemia    MS (multiple sclerosis) ([place])    diagnosed 9/16   Neurogenic bladder    self-caths   PONV (postoperative nausea and vomiting)     PAST SURGICAL HISTORY: Past Surgical History:  Procedure Laterality Date   BREAST LUMPECTOMY Right    benign   COLONOSCOPY     CYSTOSCOPY WITH INJECTION N/A 04/23/2017   Procedure: CYSTOSCOPY WITH INJECTION/ BOTOX 200 UNITS;  Surgeon: Festus Aloe, MD;  Location: [facility];  Service: Urology;  Laterality: N/A;   OOPHORECTOMY Left    2013   TONSILLECTOMY AND ADENOIDECTOMY     age 30   TRANSURETHRAL RESECTION OF BLADDER TUMOR N/A 04/23/2017   Procedure: TRANSURETHRAL RESECTION OF BLADDER TUMOR (TURBT);  Surgeon: Festus Aloe, MD;  Location: [facility];  Service: Urology;  Laterality: N/A;    FAMILY HISTORY: Family History  Problem Relation Age of Onset   Cancer Mother    Cancer Father    Diabetes Sister     SOCIAL HISTORY:  Social History   Socioeconomic History   Marital status: Married    Spouse name: Not on file   Number of children: Not on file   Years of education: Not on file   Highest education level: Not on file  Occupational History   Occupation: disabled  Tobacco Use   Smoking status: Never   Smokeless tobacco: Never  Vaping Use   Vaping Use: Never used  Substance and Sexual Activity   Alcohol use: Not Currently    Comment: rare   Drug use: No   Sexual activity: Not Currently  Other Topics Concern   Not on file  Social History Narrative   Not on file   Social Determinants of Health   Financial Resource Strain: Not on  file  Food Insecurity: Not on file  Transportation Needs: Not on file  Physical Activity: Not on file  Stress: Not on file  Social Connections: Not on file  Intimate Partner Violence: Not on file     PHYSICAL EXAM  Vitals:   04/02/22 1127  BP: 99/79  Pulse: 100  Weight: 122 lb 8 oz (55.6 kg)  Height: 5\' 5"  (1.651 m)    Body mass index is 20.39 kg/m.   General: The patient is well-developed and well-nourished and in no acute distress.   Hearing is symmetric.  Mild right ankle edema.  Neurologic Exam  Mental status: The patient is alert and oriented x 3 at the time of the examination. The patient has apparent normal recent and remote memory, with an apparently normal attention span and concentration ability.   Speech is normal.  Cranial nerves: Extraocular movements shows right nystagmus to right gaze with mildly reduced ability to abduct the right eye and milder nystagmus when she looks to the left.. There is mildly reduced color vision OS.  Facial strength and sensation is normal. Trapezius strength is strong   Motor:  Muscle bulk is normal.   Increased muscle tone in the legs, right greater than left.  Strength was 5/5 in the arms.  However, rapid alternating movements were mildly reduced on the left hand.  Strength was 4/5 in left leg and 3/5 in the right iliopsoas and EHL and 4-/5 elsewhere in the right leg  Sensory: She has normal sensation to touch and vibration in arms but reduced right vibration sense in leg  Coordination: Cerebellar testing shows fairly good finger-nose-finger and reduced heel-to-shin, poor on right.  Gait and station: She is able to rise up from a chair using one arm for support and can stand without support.  She has foot drop, right greater than left.  She can shuffle a couple steps but does much better with a rollator style walker.   Romberg negative.  Reflexes: Deep tendon reflexes are symmetric and 3+ bilaterally  and increased in the knees (with  spread, worse right).  No ankle clonus..          ASSESSMENT AND PLAN  Multiple sclerosis ([place]) - Plan: CBC with Differential/Platelet, Comprehensive metabolic panel  High risk medication use - Plan: CBC with Differential/Platelet, Comprehensive metabolic panel  Urinary retention  Other fatigue  Gait disturbance  Hypersomnia   1.   Continue Tecfidera.  Check the lymphocyte count and LFT.    Check MRI's at nest visit.    2.    Stay active and exercises tolerated.   Continue exercises learned in PT 3.    We have discussed the transition from relapse remitting to secondary progressive MS.  She is showing some progression in the absence of new MRI lesions.   4.   I will see her back 6 months or sooner if she has new or worsening symptoms.     Dantae Meunier A. Felecia Shelling, MD, PhD 5/36/4680, 32:12 PM Certified in Neurology, Clinical Neurophysiology, Sleep Medicine, Pain Medicine and Neuroimaging  [facility] [address] [place], [place] [address] [phone]

## 2022-04-03 LAB — CBC WITH DIFFERENTIAL/PLATELET
Basophils Absolute: 0.1 10*3/uL (ref 0.0–0.2)
Basos: 1 %
EOS (ABSOLUTE): 0.3 10*3/uL (ref 0.0–0.4)
Eos: 5 %
Hematocrit: 43.1 % (ref 34.0–46.6)
Hemoglobin: 13.9 g/dL (ref 11.1–15.9)
Immature Grans (Abs): 0.1 10*3/uL (ref 0.0–0.1)
Immature Granulocytes: 1 %
Lymphocytes Absolute: 1.5 10*3/uL (ref 0.7–3.1)
Lymphs: 23 %
MCH: 31.4 pg (ref 26.6–33.0)
MCHC: 32.3 g/dL (ref 31.5–35.7)
MCV: 98 fL — ABNORMAL HIGH (ref 79–97)
Monocytes Absolute: 0.7 10*3/uL (ref 0.1–0.9)
Monocytes: 11 %
Neutrophils Absolute: 3.7 10*3/uL (ref 1.4–7.0)
Neutrophils: 59 %
Platelets: 433 10*3/uL (ref 150–450)
RBC: 4.42 x10E6/uL (ref 3.77–5.28)
RDW: 13 % (ref 11.7–15.4)
WBC: 6.3 10*3/uL (ref 3.4–10.8)

## 2022-04-03 LAB — COMPREHENSIVE METABOLIC PANEL
ALT: 12 IU/L (ref 0–32)
AST: 11 IU/L (ref 0–40)
Albumin/Globulin Ratio: 1.5 (ref 1.2–2.2)
Albumin: 4.2 g/dL (ref 3.8–4.9)
Alkaline Phosphatase: 111 IU/L (ref 44–121)
BUN/Creatinine Ratio: 19 (ref 9–23)
BUN: 12 mg/dL (ref 6–24)
Bilirubin Total: 0.2 mg/dL (ref 0.0–1.2)
CO2: 24 mmol/L (ref 20–29)
Calcium: 10.3 mg/dL — ABNORMAL HIGH (ref 8.7–10.2)
Chloride: 100 mmol/L (ref 96–106)
Creatinine, Ser: 0.63 mg/dL (ref 0.57–1.00)
Globulin, Total: 2.8 g/dL (ref 1.5–4.5)
Glucose: 95 mg/dL (ref 70–99)
Potassium: 4.5 mmol/L (ref 3.5–5.2)
Sodium: 142 mmol/L (ref 134–144)
Total Protein: 7 g/dL (ref 6.0–8.5)
eGFR: 102 mL/min/{1.73_m2} (ref >59)

## 2022-05-08 DIAGNOSIS — Z0289 Encounter for other administrative examinations: Secondary | ICD-10-CM

## 2022-05-09 ENCOUNTER — Telehealth: Payer: Self-pay | Admitting: *Deleted

## 2022-05-09 NOTE — Telephone Encounter (Signed)
Gave completed/signed [place]/attending physician statement form back to medical records to process for pt.

## 2022-05-10 ENCOUNTER — Telehealth: Payer: Self-pay | Admitting: *Deleted

## 2022-05-10 NOTE — Telephone Encounter (Signed)
Pt [place] form faxed on 05/10/2022

## 2022-05-21 ENCOUNTER — Other Ambulatory Visit: Payer: Self-pay | Admitting: Neurology

## 2022-05-21 DIAGNOSIS — G35 Multiple sclerosis: Secondary | ICD-10-CM

## 2022-06-02 ENCOUNTER — Other Ambulatory Visit: Payer: Self-pay | Admitting: Neurology

## 2022-06-04 MED ORDER — DIAZEPAM 5 MG PO TABS: 5.0000 mg | ORAL_TABLET | Freq: Three times a day (TID) | ORAL | 1 refills | Status: DC | PRN

## 2022-07-03 ENCOUNTER — Ambulatory Visit: Payer: Medicare Other | Admitting: Neurology

## 2022-09-24 ENCOUNTER — Other Ambulatory Visit: Payer: Self-pay | Admitting: Neurology

## 2022-09-24 DIAGNOSIS — G35 Multiple sclerosis: Secondary | ICD-10-CM

## 2022-09-25 NOTE — Telephone Encounter (Signed)
Last seen on 04/02/22 per note "Continue Tecfidera " Follow up scheduled on 10/01/22

## 2022-10-01 ENCOUNTER — Ambulatory Visit (INDEPENDENT_AMBULATORY_CARE_PROVIDER_SITE_OTHER): Payer: Medicare Other | Admitting: Neurology

## 2022-10-01 ENCOUNTER — Encounter: Payer: Self-pay | Admitting: Neurology

## 2022-10-01 ENCOUNTER — Telehealth: Payer: Self-pay | Admitting: Neurology

## 2022-10-01 VITALS — BP 108/71 | HR 75 | Ht 63.0 in | Wt 134.0 lb

## 2022-10-01 DIAGNOSIS — R454 Irritability and anger: Secondary | ICD-10-CM

## 2022-10-01 DIAGNOSIS — R269 Unspecified abnormalities of gait and mobility: Secondary | ICD-10-CM

## 2022-10-01 DIAGNOSIS — R29898 Other symptoms and signs involving the musculoskeletal system: Secondary | ICD-10-CM

## 2022-10-01 DIAGNOSIS — G35 Multiple sclerosis: Secondary | ICD-10-CM | POA: Diagnosis not present

## 2022-10-01 DIAGNOSIS — Z79899 Other long term (current) drug therapy: Secondary | ICD-10-CM

## 2022-10-01 DIAGNOSIS — R339 Retention of urine, unspecified: Secondary | ICD-10-CM

## 2022-10-01 MED ORDER — DIAZEPAM 5 MG PO TABS: 5.0000 mg | ORAL_TABLET | Freq: Three times a day (TID) | ORAL | 1 refills | Status: DC | PRN

## 2022-10-01 MED ORDER — BUPROPION HCL ER (XL) 300 MG PO TB24: 300.0000 mg | ORAL_TABLET | Freq: Every day | ORAL | 3 refills | Status: DC

## 2022-10-01 NOTE — Telephone Encounter (Signed)
sent to GI they obtain [employer] auth [id number] 

## 2022-10-01 NOTE — Progress Notes (Signed)
[facility]  PATIENT: Crystal Park DOB: 1960-09-19  REFERRING DOCTOR OR PCP:  Narda Bonds (ENT referred)   [name] (PCP) SOURCE: paitent and records from Dr. Ezzard Standing.    CT images on PACS  _________________________________   HISTORICAL  CHIEF COMPLAINT:  Chief Complaint  Patient presents with   Follow-up    Pt in room 10, husband in room. Here for MS follow up. Pt reports being fatigue all the time. No recent falls. No tingling or numbness.    HISTORY OF PRESENT ILLNESS:  Crystal Park is a 62 y.o. woman with relapsing remitting multiple sclerosis.   Update 10/01/2022 She is on DMF and tolerates it fairly well - mild GI issues some days.   We had also discussed other DMTs in past.   She is able to walk - probably about 200 feet max with a walker and that wipes her  out.  She has trouble standing up a long time.    She uses the Rollator mostly at home.   She sits for most tasks.   No recent falls.  She uses the cane for short distances when outside the house (husband then wheels her).   Ampyra did not help.     Both legs are weak, right > left and she needs to lift the right leg with arms when she gets into the car . Balance is more of a problem than strength.     Arms are ok though grip is mildly reduced..  No major issues with hand coordination - unchanged handwriting..      She has urinary retention and she needs to does self caths about every 4-5 hours.   Oxybutynn was switched to Myrbetriq for nighttime leakage.  .   She last had a UTI 03/04/2021.    Vision is fine.     She has fatigue that is physical > cognitive.  She takes naps every day (more than last year).    She has a light snore but no OSA signs.     She has tried bed yoga but does not do more physical exercises.      She also has difficulty with focus, attention and short term fatigue.   She denies difficulty with word finding.   Adderall, armodafinil and amantadine had not helped  her.  She sleeps ok (has to wake up to cath 3-4 am and then wakes up at  am to take her morning medication but can fall back asleep and sleeps a few more hours).  Overall 12-14 hours a day/sleep.    She does not feel rested when she wakes up.  Light snoring at times but no OSA signs.    She denies depression but has anxiety.  However husband notes she is irritable and has reduced motivation.        She has osteoporosis.  She was on Fosamax for several years.  Another medications being considered.   MS History:   About 2010, she had an event with vertigo and diplopia that took 6 months to improve.  After that she began to have issues with her legs.  She was diagnosed in 2016 after weakness and abnormal signalling.   I have reviewed the MRI of the brain and the spinal cord.   The MRI of the spine shows several T2 weighted lesions, some peripherally located consistent with multiple sclerosis. None of the foci enhanced. The MRI of the brain shows multiple foci in the periventricular, juxtacortical  cortical and deep white matter consistent with multiple sclerosis.   There is also a left cerebellar focus that could represent remote stroke (it is also present on CT scan from 2010).      Her last definite exacerbation was 2016 involving gait/balance.      IMAGING MRI brain, cervical and thoracic spine 03/05/2021.were unchanged.   MRI brain 07/06/2020 showed Multiple T2/FLAIR hyperintense foci in the hemispheres and brainstem in a pattern and configuration consistent with chronic demyelinating plaque associated with multiple sclerosis.  None of the foci appear to be acute and they do not enhance.  Compared to the MRI from 01/23/2019, there are no new lesions.        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 MRI of the brain 01/23/2019 was unchanged.      MRI Brain 01/13/2020 unchanged compared to 01/23/2019  MRI of the  brain 02/15/2018 showed multiple T2/flair hyperintense foci in the hemispheres, thalamus, brainstem in a pattern and configuration consistent with chronic demyelinating plaque associated with multiple sclerosis.  None of the foci appears to be acute.  When compared to the MRI dated 04/21/2016, there is no definite interval change.   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   Normal enhancement pattern and no acute findings.  MRI of the cervical spine 04/24/2016 showed Multiple T2 hyperintense cervical cord lesions are stable in comparison with the prior cervical MRI new with the largest lesions at the C2, C4 and C7-T1 levels. No abnormal enhancement to suggest active demyelination.   No changes compared to the 10/29/2015 cervical spine MRI.  MRI of the thoracic spine 04/21/2016 showed Increased confluence of abnormal thoracic spinal cord signal from the T3 through the T5 level (series 6, image 7). Cord involvement here various from the right hemicord at T3 - to the central and left hemi cord at T4 - to nearly holocord involvement at T5 (series 8, image 13). Progressed and near holo cord signal abnormality also at T7 (series 6, image 8). Similar progressed thoracic cord signal abnormality at T9 and T10 (series 6, images 9 and 10).  Other patchy thoracic spinal cord heterogeneity, including at T8-T9 and also just above the conus at T12-L1, appears stable. No thoracic cord expansion.    There was felt to be progression compared to the August 2017 MRI  REVIEW OF SYSTEMS: Constitutional: No fevers, chills, sweats, or change in appetite.   She notes fatigue Eyes: No visual changes, double vision, eye pain Ear, nose and throat: No hearing loss, ear pain, nasal congestion, sore throat.   Has vertigo Cardiovascular: No chest pain, palpitations Respiratory:  No shortness of breath at rest or with exertion.   No  wheezes GastrointestinaI: No nausea, vomiting, diarrhea, abdominal pain, fecal incontinence Genitourinary:  See above. She has not had any recent UTI.Marland Kitchen Musculoskeletal:  No neck pain, back pain Integumentary: No rash, pruritus, skin lesions Neurological: as above Psychiatric: No depression at this time.  Mild anxiety Endocrine: No palpitations, diaphoresis, change in appetite, change in weigh or increased thirst Hematologic/Lymphatic:  No anemia, purpura, petechiae. Allergic/Immunologic: No itchy/runny eyes, nasal congestion, recent allergic reactions, rashes  ALLERGIES: Allergies  Allergen Reactions   Adderall [Amphetamine-Dextroamphetamine] Other (See Comments)    constipation    HOME MEDICATIONS:  Current Outpatient Medications:    acetaminophen (TYLENOL) 325 MG tablet, Take 1-2 tablets (325-650 mg total)  by mouth every 4 (four) hours as needed for mild pain., Disp: , Rfl:    aspirin-acetaminophen-caffeine (EXCEDRIN MIGRAINE) 250-250-65 MG tablet, Take 1 tablet by mouth every 6 (six) hours as needed for headache., Disp: 10 tablet, Rfl: 0   bismuth subsalicylate (PEPTO BISMOL) 262 MG chewable tablet, Chew 524 mg by mouth as needed for indigestion or diarrhea or loose stools., Disp: , Rfl:    buPROPion (WELLBUTRIN XL) 300 MG 24 hr tablet, Take 1 tablet (300 mg total) by mouth daily., Disp: 90 tablet, Rfl: 3   CRANBERRY PO, Take 1 capsule by mouth daily., Disp: , Rfl:    Dimethyl Fumarate 240 MG CPDR, TAKE 1 CAPSULE TWICE A DAY, Disp: 180 capsule, Rfl: 0   levothyroxine (SYNTHROID) 88 MCG tablet, Take 1 tablet (88 mcg total) by mouth daily before breakfast., Disp: 30 tablet, Rfl: 0   Multiple Vitamin (MULTIVITAMIN) tablet, Take 1 tablet by mouth daily., Disp: , Rfl:    alendronate (FOSAMAX) 70 MG tablet, Take 70 mg by mouth once a week. Take with a full glass of water on an empty stomach. (Patient not taking: Reported on 04/02/2022), Disp: , Rfl:    diazepam (VALIUM) 5 MG tablet, Take 1  tablet (5 mg total) by mouth 3 (three) times daily as needed (dizziness)., Disp: 270 tablet, Rfl: 1   docusate sodium (COLACE) 100 MG capsule, 1 capsule (Patient not taking: Reported on 04/02/2022), Disp: , Rfl:   PAST MEDICAL HISTORY: Past Medical History:  Diagnosis Date   Benign paroxysmal positional vertigo 10/12/2014   Constipation    Family history of breast cancer    Headache    history of migaines   History of kidney stones    History of MRSA infection    History of normocytic normochromic anemia    MS (multiple sclerosis) (HCC)    diagnosed 9/16   Neurogenic bladder    self-caths   PONV (postoperative nausea and vomiting)     PAST SURGICAL HISTORY: Past Surgical History:  Procedure Laterality Date   BREAST LUMPECTOMY Right    benign   COLONOSCOPY     CYSTOSCOPY WITH INJECTION N/A 04/23/2017   Procedure: CYSTOSCOPY WITH INJECTION/ BOTOX 200 UNITS;  Surgeon: Jerilee Field, MD;  Location: [facility] [place];  Service: Urology;  Laterality: N/A;   OOPHORECTOMY Left    2013   TONSILLECTOMY AND ADENOIDECTOMY     age 73   TRANSURETHRAL RESECTION OF BLADDER TUMOR N/A 04/23/2017   Procedure: TRANSURETHRAL RESECTION OF BLADDER TUMOR (TURBT);  Surgeon: Jerilee Field, MD;  Location: [facility];  Service: Urology;  Laterality: N/A;    FAMILY HISTORY: Family History  Problem Relation Age of Onset   Cancer Mother    Cancer Father    Diabetes Sister     SOCIAL HISTORY:  Social History   Socioeconomic History   Marital status: Married    Spouse name: Not on file   Number of children: Not on file   Years of education: Not on file   Highest education level: Not on file  Occupational History   Occupation: disabled  Tobacco Use   Smoking status: Never   Smokeless tobacco: Never  Vaping Use   Vaping status: Never Used  Substance and Sexual Activity   Alcohol use: Not Currently    Comment: rare   Drug use: No   Sexual activity: Not  Currently  Other Topics Concern   Not on file  Social History Narrative   Not on  file   Social Determinants of Health   Financial Resource Strain: Not on file  Food Insecurity: Not on file  Transportation Needs: Not on file  Physical Activity: Not on file  Stress: Not on file  Social Connections: Not on file  Intimate Partner Violence: Not on file     PHYSICAL EXAM  Vitals:   10/01/22 1041  BP: 108/71  Pulse: 75  Weight: 134 lb (60.8 kg)  Height: 5\' 3"  (1.6 m)    Body mass index is 23.74 kg/m.   General: The patient is well-developed and well-nourished and in no acute distress.   Hearing is symmetric.  Mild right ankle edema.  Neurologic Exam  Mental status: The patient is alert and oriented x 3 at the time of the examination. The patient has apparent normal recent and remote memory, with an apparently normal attention span and concentration ability.   Speech is normal.  Cranial nerves: Extraocular movements shows right nystagmus to right gaze with mildly reduced ability to abduct the right eye and milder nystagmus when she looks to the left.. There is mildly reduced color vision OS.  Facial strength and sensation is normal. Trapezius strength is strong   Motor:  Muscle bulk is normal.   Increased muscle tone in the legs, right greater than left.  Strength was 5/5 in the arms.  However, rapid alternating movements were mildly reduced on the left hand.  Strength was 4/5 in left leg and 3/5 in the right iliopsoas and EHL and 4-/5 elsewhere in the right leg  Sensory: She has normal sensation to touch and vibration in arms but reduced right vibration sense in leg  Coordination: Cerebellar testing shows fairly good finger-nose-finger and reduced heel-to-shin, poor on right.  Gait and station: She is able to rise up from a chair using one arm .  She can stand without support.  She has right > left  foot drop.  Romberg negative.   She is unable to go to gait. Reflexes: Deep  tendon reflexes are symmetric and 3+ bilaterally  and increased in the knees (with spread, worse right).  No ankle clonus..          ASSESSMENT AND PLAN  Multiple sclerosis (HCC) - Plan: MR BRAIN W WO CONTRAST  Abnormality of gait - Plan: MR BRAIN W WO CONTRAST  Irritable  Urinary retention  Weakness of both legs  Impaired gait   1.   Continue Tecfidera.  Check the lymphocyte count and LFT.    Check MRI to determine if progression - if so consider a different DMT    2.    Stay active and exercises tolerated.     3.    We have discussed the transition from relapse remitting to secondary progressive MS.  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  No new lesions on her last several MRIs.    4.   I will see her back 6 months or sooner if she has new or worsening symptoms.  This visit is part of a comprehensive longitudinal care medical relationship regarding the patients primary diagnosis of MS and related concerns.      Ronnika Collett A. Epimenio Foot, MD, PhD 10/01/2022, 11:23 AM Certified in Neurology, Clinical Neurophysiology, Sleep Medicine, Pain Medicine and Neuroimaging  [facility] [address] [place], [place] [address] [phone]

## 2022-10-29 ENCOUNTER — Encounter: Payer: Self-pay | Admitting: Neurology

## 2022-11-02 ENCOUNTER — Ambulatory Visit
Admission: RE | Admit: 2022-11-02 | Discharge: 2022-11-02 | Disposition: A | Discharge status: Home / Self Care | Payer: Managed Care, Other (non HMO) | Source: Ambulatory Visit | Attending: Neurology | Admitting: Neurology

## 2022-11-02 DIAGNOSIS — R269 Unspecified abnormalities of gait and mobility: Secondary | ICD-10-CM | POA: Diagnosis not present

## 2022-11-02 DIAGNOSIS — G35 Multiple sclerosis: Secondary | ICD-10-CM | POA: Diagnosis not present

## 2022-11-02 MED ORDER — GADOPICLENOL 0.5 MMOL/ML IV SOLN
6.0000 mL | Freq: Once | INTRAVENOUS | Status: AC | PRN
  Administered 2022-11-02: 6 mL via INTRAVENOUS

## 2022-11-04 ENCOUNTER — Other Ambulatory Visit: Payer: Managed Care, Other (non HMO)

## 2022-12-24 ENCOUNTER — Other Ambulatory Visit: Payer: Self-pay | Admitting: Neurology

## 2022-12-24 DIAGNOSIS — G35 Multiple sclerosis: Secondary | ICD-10-CM

## 2022-12-24 NOTE — Telephone Encounter (Signed)
Last seen on 10/01/22 Follow up scheduled on 04/11/23

## 2023-04-11 ENCOUNTER — Encounter: Payer: Self-pay | Admitting: Neurology

## 2023-04-11 ENCOUNTER — Ambulatory Visit (INDEPENDENT_AMBULATORY_CARE_PROVIDER_SITE_OTHER): Payer: Managed Care, Other (non HMO) | Admitting: Neurology

## 2023-04-11 VITALS — Ht 65.0 in | Wt 145.0 lb

## 2023-04-11 DIAGNOSIS — N319 Neuromuscular dysfunction of bladder, unspecified: Secondary | ICD-10-CM

## 2023-04-11 DIAGNOSIS — R269 Unspecified abnormalities of gait and mobility: Secondary | ICD-10-CM

## 2023-04-11 DIAGNOSIS — Z79899 Other long term (current) drug therapy: Secondary | ICD-10-CM | POA: Diagnosis not present

## 2023-04-11 DIAGNOSIS — R41840 Attention and concentration deficit: Secondary | ICD-10-CM

## 2023-04-11 DIAGNOSIS — G35 Multiple sclerosis: Secondary | ICD-10-CM | POA: Diagnosis not present

## 2023-04-11 MED ORDER — DIAZEPAM 5 MG PO TABS
5.0000 mg | ORAL_TABLET | Freq: Three times a day (TID) | ORAL | 1 refills | Status: DC | PRN
Start: 2023-04-11 — End: 2023-12-12

## 2023-04-11 MED ORDER — DIMETHYL FUMARATE 240 MG PO CPDR: 1.0000 | DELAYED_RELEASE_CAPSULE | Freq: Two times a day (BID) | ORAL | 3 refills | Status: DC

## 2023-04-11 NOTE — Progress Notes (Signed)
 [facility]  PATIENT: Crystal Park DOB: [birth year]  REFERRING DOCTOR OR PCP:  Medford Angle (ENT referred)   Carlin Gull (PCP) SOURCE: paitent and records from Dr. Angle.    CT images on PACS  _________________________________   HISTORICAL  CHIEF COMPLAINT:  Chief Complaint  Patient presents with   Room 10    Pt is here with her Husband. Pt states she has osteoporosis. Pt states that everything has been pretty much been stable since her last appointment.     HISTORY OF PRESENT ILLNESS:  Crystal Park is a 63 y.o. woman with relapsing remitting multiple sclerosis.   Update 04/11/2023 She is on DMF and tolerates it fairly well - mild GI issues some days.   We had also discussed other DMTs in past.   She is able to walk - 100-200 feet max with a walker and that wipes her out for a while.  She can use a cane for 20-30 feet.  Outdoors she usually uses a wheelchair.   She has trouble standing up a long time.    She uses the Rollator mostly at home.   She sits for most tasks.   No recent falls.  She uses the cane for short distances when outside the house (husband then wheels her).   We tried Ampyra  but it did not help.     Both legs are weak, right > left and she needs to lift the right leg with arms when she gets into the car . Balance is more of a problem than strength.     Arms are ok though grip is mildly reduced..  She denies incoordination - unchanged handwriting..      She has urinary retention and she needs to does self caths about every 4-5 hours and does well.  She will have leakage if she does not self-cath.  .   Oxybutynn was switched to Myrbetriq  for nighttime leakage.  .   Her last UTI 03/04/2021.    Vision is fine.     She has fatigue that is physical > cognitive everyday.  She takes naps every day (more than last year).    She has a light snore but no OSA signs.     She has tried bed yoga but does not do more physical exercises.      She also  has difficulty with focus, attention and short term fatigue.   She denies difficulty with word finding.   Adderall, armodafinil  and amantadine  had not helped her.  Wellbutrin  helps some. She sleeps ok with one awakening to self cath   Overall 12-14 hours a day/sleep.    She does not feel rested when she wakes up.  Light snoring at times but no OSA signs.    She has mild depression nd anxiety.  Nw on Wellbutrin . However husband notes she is irritable and has reduced motivation.        She has osteoporosis.  She was on Fosamax for several years and now is on Evenity ad may go on Prolia   MS History:   About 2010, she had an event with vertigo and diplopia that took 6 months to improve.  After that she began to have issues with her legs.  She was diagnosed in 2016 after weakness and abnormal signalling.   I have reviewed the MRI of the brain and the spinal cord.   The MRI of the spine shows several T2 weighted lesions, some peripherally located consistent  with multiple sclerosis. None of the foci enhanced. The MRI of the brain shows multiple foci in the periventricular, juxtacortical cortical and deep white matter consistent with multiple sclerosis.   There is also a left cerebellar focus that could represent remote stroke (it is also present on CT scan from 2010).      Her last definite exacerbation was 2016 involving gait/balance.      IMAGING MRI brai 11/02/2022 showed Multiple T2/FLAIR hyperintense foci in the cerebral hemispheres, left middle cerebellar peduncle,thalamus, pons and spinal cord in a pattern consistent with chronic demyelinating plaque associated with multiple sclerosis.  None of the foci enhanced or appear to be acute.  Compared to the MRI from 03/05/2021, there are no new lesions.  Larger focus in the left cerebellar hemisphere has an appearance more consistent with a remote stroke.  It is unchanged compared to the previous MRIs. MRI brain, cervical and thoracic spine 03/05/2021.were  unchanged.   MRI brain 07/06/2020 showed Multiple T2/FLAIR hyperintense foci in the hemispheres and brainstem in a pattern and configuration consistent with chronic demyelinating plaque associated with multiple sclerosis.  None of the foci appear to be acute and they do not enhance.  Compared to the MRI from 01/23/2019, there are no new lesions.        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 MRI of the brain 01/23/2019 was unchanged.      MRI Brain 01/13/2020 unchanged compared to 01/23/2019  MRI of the brain 02/15/2018 showed multiple T2/flair hyperintense foci in the hemispheres, thalamus, brainstem in a pattern and configuration consistent with chronic demyelinating plaque associated with multiple sclerosis.  None of the foci appears to be acute.  When compared to the MRI dated 04/21/2016, there is no definite interval change.   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   Normal enhancement pattern and no acute findings.  MRI of the cervical spine 04/24/2016 showed Multiple T2 hyperintense cervical cord lesions are stable in comparison with the prior cervical MRI new with the largest lesions at the C2, C4 and C7-T1 levels. No abnormal enhancement to suggest active demyelination.   No changes compared to the 10/29/2015 cervical spine MRI.  MRI of the thoracic spine 04/21/2016 showed Increased confluence of abnormal thoracic spinal cord signal from the T3 through the T5 level (series 6, image 7). Cord involvement here various from the right hemicord at T3 - to the central and left hemi cord at T4 - to nearly holocord involvement at T5 (series 8, image 13). Progressed and near holo cord signal abnormality also at T7 (series 6, image 8). Similar progressed thoracic cord signal abnormality at T9 and T10 (series 6, images 9 and  10).  Other patchy thoracic spinal cord heterogeneity, including at T8-T9 and also just above the conus at T12-L1, appears stable. No thoracic cord expansion.    There was felt to be progression compared to the August 2017 MRI  REVIEW OF SYSTEMS: Constitutional: No fevers, chills, sweats, or change in appetite.   She notes fatigue Eyes: No visual changes, double vision, eye pain Ear, nose and throat: No hearing loss, ear pain, nasal congestion, sore throat.   Has vertigo Cardiovascular: No chest pain, palpitations Respiratory:  No shortness of breath at rest or with exertion.   No wheezes GastrointestinaI: No nausea, vomiting, diarrhea, abdominal pain, fecal incontinence Genitourinary:  See above.  She has not had any recent UTI.SABRA Musculoskeletal:  No neck pain, back pain Integumentary: No rash, pruritus, skin lesions Neurological: as above Psychiatric: No depression at this time.  Mild anxiety Endocrine: No palpitations, diaphoresis, change in appetite, change in weigh or increased thirst Hematologic/Lymphatic:  No anemia, purpura, petechiae. Allergic/Immunologic: No itchy/runny eyes, nasal congestion, recent allergic reactions, rashes  ALLERGIES: Allergies  Allergen Reactions   Adderall [Amphetamine -Dextroamphetamine ] Other (See Comments)    constipation    HOME MEDICATIONS:  Current Outpatient Medications:    acetaminophen  (TYLENOL ) 325 MG tablet, Take 1-2 tablets (325-650 mg total) by mouth every 4 (four) hours as needed for mild pain., Disp: , Rfl:    aspirin -acetaminophen -caffeine  (EXCEDRIN  MIGRAINE) 250-250-65 MG tablet, Take 1 tablet by mouth every 6 (six) hours as needed for headache., Disp: 10 tablet, Rfl: 0   bismuth subsalicylate (PEPTO BISMOL) 262 MG chewable tablet, Chew 524 mg by mouth as needed for indigestion or diarrhea or loose stools., Disp: , Rfl:    buPROPion  (WELLBUTRIN  XL) 300 MG 24 hr tablet, Take 1 tablet (300 mg total) by mouth daily., Disp: 90 tablet, Rfl:  3   CRANBERRY PO, Take 1 capsule by mouth daily., Disp: , Rfl:    levothyroxine  (SYNTHROID ) 88 MCG tablet, Take 1 tablet (88 mcg total) by mouth daily before breakfast., Disp: 30 tablet, Rfl: 0   mirabegron  ER (MYRBETRIQ ) 25 MG TB24 tablet, Take 25 mg by mouth daily., Disp: , Rfl:    Multiple Vitamin (MULTIVITAMIN) tablet, Take 1 tablet by mouth daily., Disp: , Rfl:    Romosozumab-aqqg (EVENITY) 105 MG/1. SOSY injection, Inject 210 mg into the skin every 30 (thirty) days., Disp: , Rfl:    diazepam  (VALIUM ) 5 MG tablet, Take 1 tablet (5 mg total) by mouth 3 (three) times daily as needed (dizziness)., Disp: 270 tablet, Rfl: 1   Dimethyl Fumarate  240 MG CPDR, Take 1 capsule (240 mg total) by mouth 2 (two) times daily., Disp: 180 capsule, Rfl: 3  PAST MEDICAL HISTORY: Past Medical History:  Diagnosis Date   Benign paroxysmal positional vertigo 10/12/2014   Constipation    Family history of breast cancer    Headache    history of migaines   History of kidney stones    History of MRSA infection    History of normocytic normochromic anemia    MS (multiple sclerosis) (HCC)    diagnosed 9/16   Neurogenic bladder    self-caths   PONV (postoperative nausea and vomiting)     PAST SURGICAL HISTORY: Past Surgical History:  Procedure Laterality Date   BREAST LUMPECTOMY Right    benign   COLONOSCOPY     CYSTOSCOPY WITH INJECTION N/A 04/23/2017   Procedure: CYSTOSCOPY WITH INJECTION/ BOTOX  200 UNITS;  Surgeon: Nieves Cough, MD;  Location: [facility];  Service: Urology;  Laterality: N/A;   OOPHORECTOMY Left    2013   TONSILLECTOMY AND ADENOIDECTOMY     age 29   TRANSURETHRAL RESECTION OF BLADDER TUMOR N/A 04/23/2017   Procedure: TRANSURETHRAL RESECTION OF BLADDER TUMOR (TURBT);  Surgeon: Nieves Cough, MD;  Location: [facility];  Service: Urology;  Laterality: N/A;    FAMILY HISTORY: Family History  Problem Relation Age of Onset   Cancer Mother     Cancer Father    Diabetes Sister     SOCIAL HISTORY:  Social History   Socioeconomic History   Marital status: Married    Spouse name: Not on file   Number of children:  Not on file   Years of education: Not on file   Highest education level: Not on file  Occupational History   Occupation: disabled  Tobacco Use   Smoking status: Never   Smokeless tobacco: Never  Vaping Use   Vaping status: Never Used  Substance and Sexual Activity   Alcohol use: Not Currently    Comment: rare   Drug use: No   Sexual activity: Not Currently  Other Topics Concern   Not on file  Social History Narrative   Not on file   Social Drivers of Health   Financial Resource Strain: Not on file  Food Insecurity: Not on file  Transportation Needs: Not on file  Physical Activity: Not on file  Stress: Not on file  Social Connections: Not on file  Intimate Partner Violence: Not on file     PHYSICAL EXAM  Vitals:   04/11/23 1124  Weight: 145 lb (65.8 kg)  Height: 5' 5 (1.651 m)     Body mass index is 24.13 kg/m.   General: The patient is well-developed and well-nourished and in no acute distress.   Hearing is symmetric.  Mild right ankle edema.  Neurologic Exam  Mental status: The patient is alert and oriented x 3 at the time of the examination. The patient has apparent normal recent and remote memory, with an apparently normal attention span and concentration ability.   Speech is normal.  Cranial nerves: Extraocular movements shows right nystagmus to right gaze with mildly reduced ability to abduct the right eye and milder nystagmus when she looks to the left.. There is mildly reduced color vision OS.  Facial strength and sensation is normal. Trapezius strength is strong   Motor:  Muscle bulk is normal.   Increased muscle tone in the legs, right greater than left.  Strength was 5/5 in the arms.  RAMs were mildly reduced in the left hand.  Strength was 4/5 in left leg and 2+/5 in the  right iliopsoas and EHL and 4-/5 elsewhere in the right leg  Sensory: She has normal sensation to touch and vibration in arms but reduced right vibration sense in leg  Coordination: Cerebellar testing shows fairly good finger-nose-finger and reduced heel-to-shin, poor on right.  Gait and station: She is able to rise up from a chair using one arm .  She can stand without support.  She has right > left  foot drop.  Romberg negative.   She is unable to go to gait.  Reflexes: Deep tendon reflexes are symmetric and 3+ bilaterally  and increased in the knees (with spread, worse right).  No ankle clonus..          ASSESSMENT AND PLAN  Multiple sclerosis (HCC) - Plan: Dimethyl Fumarate  240 MG CPDR, CBC with Differential/Platelet, Comprehensive metabolic panel  Abnormality of gait  High risk medication use - Plan: CBC with Differential/Platelet, Comprehensive metabolic panel  Attention or concentration deficit  Neurogenic bladder   1.   Continue Tecfidera .  Check the lymphocyte count and LFT.    2.    Stay active and exercises tolerated.     3.    We have discussed the transition from relapse remitting to secondary progressive MS.  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  No new lesions on her last several MRI's while on treamtnent.   We discussed remaining on DMF vs. Change to Mavenclad vs waiting for tolebrutinib.   4.   I will see her back 6 months or sooner if she  has new or worsening symptoms.  This visit is part of a comprehensive longitudinal care medical relationship regarding the patients primary diagnosis of MS and related concerns.      Britt Petroni A. Vear, MD, PhD 04/11/2023, 12:05 PM Certified in Neurology, Clinical Neurophysiology, Sleep Medicine, Pain Medicine and Neuroimaging  [facility] [address], [address] [place], [place] [address] [phone]

## 2023-04-12 ENCOUNTER — Encounter: Payer: Self-pay | Admitting: Neurology

## 2023-04-12 LAB — COMPREHENSIVE METABOLIC PANEL
ALT: 38 [IU]/L — ABNORMAL HIGH (ref 0–32)
AST: 26 [IU]/L (ref 0–40)
Albumin: 4.4 g/dL (ref 3.8–4.9)
Alkaline Phosphatase: 102 [IU]/L (ref 44–121)
BUN/Creatinine Ratio: 26 (ref 12–28)
BUN: 20 mg/dL (ref 8–27)
Bilirubin Total: 0.4 mg/dL (ref 0.0–1.2)
CO2: 24 mmol/L (ref 20–29)
Calcium: 9.3 mg/dL (ref 8.7–10.3)
Chloride: 104 mmol/L (ref 96–106)
Creatinine, Ser: 0.77 mg/dL (ref 0.57–1.00)
Globulin, Total: 2.1 g/dL (ref 1.5–4.5)
Glucose: 84 mg/dL (ref 70–99)
Potassium: 4.3 mmol/L (ref 3.5–5.2)
Sodium: 141 mmol/L (ref 134–144)
Total Protein: 6.5 g/dL (ref 6.0–8.5)
eGFR: 88 mL/min/{1.73_m2} (ref >59)

## 2023-04-12 LAB — CBC WITH DIFFERENTIAL/PLATELET
Basophils Absolute: 0.1 10*3/uL (ref 0.0–0.2)
Basos: 1 %
EOS (ABSOLUTE): 0.2 10*3/uL (ref 0.0–0.4)
Eos: 4 %
Hematocrit: 41.9 % (ref 34.0–46.6)
Hemoglobin: 13.8 g/dL (ref 11.1–15.9)
Immature Grans (Abs): 0 10*3/uL (ref 0.0–0.1)
Immature Granulocytes: 0 %
Lymphocytes Absolute: 1.9 10*3/uL (ref 0.7–3.1)
Lymphs: 40 %
MCH: 32.2 pg (ref 26.6–33.0)
MCHC: 32.9 g/dL (ref 31.5–35.7)
MCV: 98 fL — ABNORMAL HIGH (ref 79–97)
Monocytes Absolute: 0.5 10*3/uL (ref 0.1–0.9)
Monocytes: 10 %
Neutrophils Absolute: 2.1 10*3/uL (ref 1.4–7.0)
Neutrophils: 45 %
Platelets: 333 10*3/uL (ref 150–450)
RBC: 4.29 x10E6/uL (ref 3.77–5.28)
RDW: 12.3 % (ref 11.7–15.4)
WBC: 4.8 10*3/uL (ref 3.4–10.8)

## 2023-04-16 ENCOUNTER — Telehealth: Payer: Self-pay | Admitting: Pharmacist

## 2023-04-16 NOTE — Telephone Encounter (Signed)
Pharmacy Patient Advocate Encounter   Received notification from CoverMyMeds that prior authorization for Dimethyl Fumarate 240MG  dr capsules is required/requested.   Insurance verification completed.   The patient is insured through [employer] .   Per test claim: PA required; PA submitted to above mentioned insurance via CoverMyMeds Key/confirmation #/EOC [id number] Status is pending

## 2023-04-24 NOTE — Telephone Encounter (Signed)
Pharmacy Patient Advocate Encounter  Received notification from [employer] that Prior Authorization for Dimethyl Fumarate 240MG  dr capsules has been APPROVED from 03/17/2023 to 04/22/2024   PA #/Case ID/Reference #: [id number]

## 2023-06-27 ENCOUNTER — Telehealth: Payer: Self-pay | Admitting: *Deleted

## 2023-06-27 DIAGNOSIS — Z0289 Encounter for other administrative examinations: Secondary | ICD-10-CM

## 2023-06-27 NOTE — Telephone Encounter (Signed)
 Gave completed/signed [employer]/physician statement form back to medical records to process for pt.

## 2023-09-03 ENCOUNTER — Other Ambulatory Visit: Payer: Self-pay | Admitting: Neurology

## 2023-09-03 NOTE — Telephone Encounter (Signed)
 Last seen on 04/11/23 Follow up scheduled on 10/29/23   Dr.Sater did you want patient to continue Rx? I didn't see it mentioned in note.

## 2023-10-29 ENCOUNTER — Ambulatory Visit (INDEPENDENT_AMBULATORY_CARE_PROVIDER_SITE_OTHER): Payer: Managed Care, Other (non HMO) | Admitting: Neurology

## 2023-10-29 VITALS — BP 122/70 | HR 76 | Ht 65.0 in | Wt 154.5 lb

## 2023-10-29 DIAGNOSIS — Z79899 Other long term (current) drug therapy: Secondary | ICD-10-CM | POA: Diagnosis not present

## 2023-10-29 DIAGNOSIS — F09 Unspecified mental disorder due to known physiological condition: Secondary | ICD-10-CM

## 2023-10-29 DIAGNOSIS — R269 Unspecified abnormalities of gait and mobility: Secondary | ICD-10-CM

## 2023-10-29 DIAGNOSIS — R41840 Attention and concentration deficit: Secondary | ICD-10-CM | POA: Diagnosis not present

## 2023-10-29 DIAGNOSIS — G35 Multiple sclerosis: Secondary | ICD-10-CM | POA: Diagnosis not present

## 2023-10-29 DIAGNOSIS — N319 Neuromuscular dysfunction of bladder, unspecified: Secondary | ICD-10-CM

## 2023-10-29 MED ORDER — AMPHETAMINE-DEXTROAMPHETAMINE 10 MG PO TABS: 10.0000 mg | ORAL_TABLET | Freq: Every day | ORAL | 0 refills | Status: AC

## 2023-10-29 NOTE — Patient Instructions (Addendum)
 [place]   Royden Mayhew, MD Pearley Shape, MD  [facility] for [facility] [address] [place], [address] [place], [place] [address] Directions [phone]  ______________________________________________-  Arland Gavel, MD [facility] [address] [place], [place] [address] Show on Map [phone]    In future consider Mavenclad Tolebrutinib if approved

## 2023-10-29 NOTE — Progress Notes (Signed)
 [facility]  PATIENT: Crystal Park DOB: 1960/04/11  REFERRING DOCTOR OR PCP:  Crystal Park (ENT referred)   Crystal Park (PCP) SOURCE: paitent and records from Dr. Angle.    CT images on PACS  _________________________________   HISTORICAL  CHIEF COMPLAINT:  Chief Complaint  Patient presents with   Follow-up    RM 11, with spouse. Last seen 05/01/23. In Wheelchair, able to stand to obtain weight. MS DMT: dimethyl fumarate . Take pepto bismol a lot for upset stomach. Has no appetite starting a couple months ago. Having a hard time with bowel movements. Worsening constipation. Takes stool softener.  Saw GI doctor a few years ago. Felt it was MS related. No meds prescribed to try. Had one fall since visit last Friday night. Missed toilet seat when going to sit. Did not hit head. Hit back.     HISTORY OF PRESENT ILLNESS:  Crystal Park is a 63 y.o. woman with relapsing remitting multiple sclerosis.   Update 10/29/2023 She is on.  She has no severe tolerability issues though does get some GI upset now and then..   We had discussed other DMTs in past.  Today we discussed that she most likely has a relapsing form of secondary progressive MS.  She has not had any new lesions or recent exacerbation.  We discussed that we could consider different medication such as tolebrutinib if it becomes available later this year.  Mavenclad, although FDA approved for relapsing forms of MS, by its mechanism could also have some benefit.  She is able to walk - 100-200 feet max with a walker though is tired after this.  She does shorter 20-50 feet walks easily with walker.   She can use a cane for 20-30 feet.  Outdoors she usually uses a wheelchair.    She uses the Rollator mostly at home.   She sits for most tasks.  She has rare falls.  She uses the cane for short distances when outside the house (husband then wheels her).   We tried Ampyra  but it did not help.     Both legs are  weak, right > left and she needs to lift the right leg with arms when she gets into the car . Balance is more of a problem than strength.     Arms are ok though grip is mildly reduced..  She denies incoordination - unchanged handwriting..      She has urinary retention and she needs to does self caths about every 4-5 hours and does well.  She will have leakage if she does not self-cath.  .   Oxybutynn was switched to Myrbetriq  for nighttime leakage.  .   Her last UTI 03/04/2021.    Vision is fine.     She is noting more difficulties with cognition.  She has reduced focus/attention.  Short-term memory is reduced.  Sometimes she has reduced word finding.  She had tried Adderall many years ago and stopped it due to to constipation.  She has fatigue (both physical and cognitive) everyday.  She takes naps every day (more than last year).    She has a light snore but no OSA signs.     She has tried bed yoga but does not do more physical exercises.      She sleeps ok with one awakening to self cath   Overall 12-14 hours a day/sleep.    She does not feel rested when she wakes up.  Light snoring at  times but no OSA signs.    She has mild depression and anxiety. She stopped Wellbutrin ..  She does not feel mood is bad enough to begin a different medication.  She has osteoporosis.  She was on Fosamax for several years and now is on Evenity ad may go on Prolia   They will be moving to [place].  Within a couple weeks.  She has  MS History:   About 2010, she had an event with vertigo and diplopia that took 6 months to improve.  After that she began to have issues with her legs.  She was diagnosed in 2016 after weakness and abnormal signalling.   I have reviewed the MRI of the brain and the spinal cord.   The MRI of the spine shows several T2 weighted lesions, some peripherally located consistent with multiple sclerosis. None of the foci enhanced. The MRI of the brain shows multiple foci in the periventricular,  juxtacortical cortical and deep white matter consistent with multiple sclerosis.   There is also a left cerebellar focus that could represent remote stroke (it is also present on CT scan from 2010).      Her last definite exacerbation was 2016 involving gait/balance.      IMAGING MRI brai 11/02/2022 showed Multiple T2/FLAIR hyperintense foci in the cerebral hemispheres, left middle cerebellar peduncle,thalamus, pons and spinal cord in a pattern consistent with chronic demyelinating plaque associated with multiple sclerosis.  None of the foci enhanced or appear to be acute.  Compared to the MRI from 03/05/2021, there are no new lesions.  Larger focus in the left cerebellar hemisphere has an appearance more consistent with a remote stroke.  It is unchanged compared to the previous MRIs. MRI brain, cervical and thoracic spine 03/05/2021.were unchanged.   MRI brain 07/06/2020 showed Multiple T2/FLAIR hyperintense foci in the hemispheres and brainstem in a pattern and configuration consistent with chronic demyelinating plaque associated with multiple sclerosis.  None of the foci appear to be acute and they do not enhance.  Compared to the MRI from 01/23/2019, there are no new lesions.        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 MRI of the brain 01/23/2019 was unchanged.      MRI Brain 01/13/2020 unchanged compared to 01/23/2019  MRI of the brain 02/15/2018 showed multiple T2/flair hyperintense foci in the hemispheres, thalamus, brainstem in a pattern and configuration consistent with chronic demyelinating plaque associated with multiple sclerosis.  None of the foci appears to be acute.  When compared to the MRI dated 04/21/2016, there is no definite interval change.   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   Normal enhancement  pattern and no acute findings.  MRI of the cervical spine 04/24/2016 showed Multiple T2 hyperintense cervical cord lesions are stable in comparison with the prior cervical MRI new with the largest lesions at the C2, C4 and C7-T1 levels. No abnormal enhancement to suggest active demyelination.   No changes compared to the 10/29/2015 cervical spine MRI.  MRI of the thoracic spine 04/21/2016 showed Increased confluence of abnormal thoracic spinal cord signal from the T3 through the T5 level (series 6, image 7). Cord involvement here various from the right hemicord at T3 - to the central and left hemi cord at T4 - to nearly holocord involvement at T5 (series 8, image 13). Progressed and near  holo cord signal abnormality also at T7 (series 6, image 8). Similar progressed thoracic cord signal abnormality at T9 and T10 (series 6, images 9 and 10).  Other patchy thoracic spinal cord heterogeneity, including at T8-T9 and also just above the conus at T12-L1, appears stable. No thoracic cord expansion.    There was felt to be progression compared to the August 2017 MRI  REVIEW OF SYSTEMS: Constitutional: No fevers, chills, sweats, or change in appetite.   She notes fatigue Eyes: No visual changes, double vision, eye pain Ear, nose and throat: No hearing loss, ear pain, nasal congestion, sore throat.   Has vertigo Cardiovascular: No chest pain, palpitations Respiratory:  No shortness of breath at rest or with exertion.   No wheezes GastrointestinaI: No nausea, vomiting, diarrhea, abdominal pain, fecal incontinence Genitourinary:  See above. She has not had any recent UTI.SABRA Musculoskeletal:  No neck pain, back pain Integumentary: No rash, pruritus, skin lesions Neurological: as above Psychiatric: No depression at this time.  Mild anxiety Endocrine: No palpitations, diaphoresis, change in appetite, change in weigh or increased thirst Hematologic/Lymphatic:  No anemia, purpura, petechiae. Allergic/Immunologic:  No itchy/runny eyes, nasal congestion, recent allergic reactions, rashes  ALLERGIES: Allergies  Allergen Reactions   Adderall [Amphetamine -Dextroamphetamine ] Other (See Comments)    constipation    HOME MEDICATIONS:  Current Outpatient Medications:    acetaminophen  (TYLENOL ) 325 MG tablet, Take 1-2 tablets (325-650 mg total) by mouth every 4 (four) hours as needed for mild pain., Disp: , Rfl:    amphetamine -dextroamphetamine  (ADDERALL) 10 MG tablet, Take 1 tablet (10 mg total) by mouth daily with breakfast., Disp: 60 tablet, Rfl: 0   aspirin -acetaminophen -caffeine  (EXCEDRIN  MIGRAINE) 250-250-65 MG tablet, Take 1 tablet by mouth every 6 (six) hours as needed for headache., Disp: 10 tablet, Rfl: 0   bismuth subsalicylate (PEPTO BISMOL) 262 MG chewable tablet, Chew 524 mg by mouth as needed for indigestion or diarrhea or loose stools., Disp: , Rfl:    CRANBERRY PO, Take 1 capsule by mouth daily., Disp: , Rfl:    diazepam  (VALIUM ) 5 MG tablet, Take 1 tablet (5 mg total) by mouth 3 (three) times daily as needed (dizziness)., Disp: 270 tablet, Rfl: 1   Dimethyl Fumarate  240 MG CPDR, Take 1 capsule (240 mg total) by mouth 2 (two) times daily., Disp: 180 capsule, Rfl: 3   levothyroxine  (SYNTHROID ) 88 MCG tablet, Take 1 tablet (88 mcg total) by mouth daily before breakfast., Disp: 30 tablet, Rfl: 0   mirabegron  ER (MYRBETRIQ ) 25 MG TB24 tablet, Take 25 mg by mouth daily., Disp: , Rfl:    Multiple Vitamin (MULTIVITAMIN) tablet, Take 1 tablet by mouth daily., Disp: , Rfl:    Romosozumab-aqqg (EVENITY) 105 MG/1. SOSY injection, Inject 210 mg into the skin every 30 (thirty) days., Disp: , Rfl:   PAST MEDICAL HISTORY: Past Medical History:  Diagnosis Date   Benign paroxysmal positional vertigo 10/12/2014   Constipation    Family history of breast cancer    Headache    history of migaines   History of kidney stones    History of MRSA infection    History of normocytic normochromic anemia     MS (multiple sclerosis) (HCC)    diagnosed 9/16   Neurogenic bladder    self-caths   PONV (postoperative nausea and vomiting)     PAST SURGICAL HISTORY: Past Surgical History:  Procedure Laterality Date   BREAST LUMPECTOMY Right    benign   COLONOSCOPY     CYSTOSCOPY WITH  INJECTION N/A 04/23/2017   Procedure: CYSTOSCOPY WITH INJECTION/ BOTOX  200 UNITS;  Surgeon: Nieves Cough, MD;  Location: [facility];  Service: Urology;  Laterality: N/A;   OOPHORECTOMY Left    2013   TONSILLECTOMY AND ADENOIDECTOMY     age 65   TRANSURETHRAL RESECTION OF BLADDER TUMOR N/A 04/23/2017   Procedure: TRANSURETHRAL RESECTION OF BLADDER TUMOR (TURBT);  Surgeon: Nieves Cough, MD;  Location: [facility];  Service: Urology;  Laterality: N/A;    FAMILY HISTORY: Family History  Problem Relation Age of Onset   Cancer Mother    Cancer Father    Diabetes Sister     SOCIAL HISTORY:  Social History   Socioeconomic History   Marital status: Married    Spouse name: Not on file   Number of children: Not on file   Years of education: Not on file   Highest education level: Not on file  Occupational History   Occupation: disabled  Tobacco Use   Smoking status: Never   Smokeless tobacco: Never  Vaping Use   Vaping status: Never Used  Substance and Sexual Activity   Alcohol use: Not Currently    Comment: rare   Drug use: No   Sexual activity: Not Currently  Other Topics Concern   Not on file  Social History Narrative   Not on file   Social Drivers of Health   Financial Resource Strain: Not on file  Food Insecurity: Not on file  Transportation Needs: Not on file  Physical Activity: Not on file  Stress: Not on file  Social Connections: Not on file  Intimate Partner Violence: Not on file     PHYSICAL EXAM  Vitals:   10/29/23 1104  BP: 122/70  Pulse: 76  SpO2: 97%  Weight: 154 lb 8 oz (70.1 kg)  Height: 5' 5 (1.651 m)     Body mass index is  25.71 kg/m.   General: The patient is well-developed and well-nourished and in no acute distress.   Hearing is symmetric.  Mild right ankle edema.  Neurologic Exam  Mental status: The patient is alert and oriented x 3 at the time of the examination.  She has mildly reduced short-term memory focus/attention.  Speech is normal.  Cranial nerves: Extraocular movements shows right nystagmus to right gaze with mildly reduced ability to abduct the right eye and milder nystagmus when she looks to the left.. There is mildly reduced color vision OS.  Facial strength and sensation is normal. Trapezius strength is strong   Motor:  Muscle bulk is normal.   Increased muscle tone in the legs, right greater than left.  Strength was 5/5 in the arms.  RAMs were mildly reduced in the left hand.  Strength was 4/5 in left leg and 2+/5 in the right iliopsoas and EHL and 4-/5 elsewhere in the right leg  Sensory: She has normal sensation to touch and vibration in arms but reduced right vibration sense in leg  Coordination: Cerebellar testing shows fairly good finger-nose-finger and reduced heel-to-shin, poor on right.  Gait and station: She is able to rise up from a chair using one arm .  She can stand without support.  She has right > left  foot drop.  Romberg negative.      Reflexes: Deep tendon reflexes are symmetric and 3+ bilaterally  and increased in the knees (with spread, worse right).  No ankle clonus..          ASSESSMENT AND PLAN  Multiple sclerosis (HCC)  Abnormality of gait  High risk medication use  Attention or concentration deficit  Neurogenic bladder  Cognitive deficit secondary to multiple sclerosis (HCC)   1.   Continue Tecfidera  for now.  We did discuss some other options.  We discussed the BTK inhibitors and Mavenclad as possible alternatives.  She will be moving to [place] and I gave her the name of several MS specialist there.   2.    Stay active and exercises tolerated.      3.    She is noting more difficulties with cognition.  She tried Adderall many years ago and had constipation so stopped.  She will give this another try. 4.   I will see her back 6 months or sooner if she has new or worsening symptoms.  This visit is part of a comprehensive longitudinal care medical relationship regarding the patients primary diagnosis of MS and related concerns.      Tionne Dayhoff A. Vear, MD, PhD 10/29/2023, 3:06 PM Certified in Neurology, Clinical Neurophysiology, Sleep Medicine, Pain Medicine and Neuroimaging  [facility] [address] [place], [place] [address] [phone]

## 2023-11-06 ENCOUNTER — Other Ambulatory Visit: Payer: Self-pay

## 2023-11-06 ENCOUNTER — Telehealth: Payer: Self-pay | Admitting: Neurology

## 2023-11-06 DIAGNOSIS — G35 Multiple sclerosis: Secondary | ICD-10-CM

## 2023-11-06 MED ORDER — DIMETHYL FUMARATE 240 MG PO CPDR: 1.0000 | DELAYED_RELEASE_CAPSULE | Freq: Two times a day (BID) | ORAL | 2 refills | Status: AC

## 2023-11-06 NOTE — Telephone Encounter (Signed)
Sent to Pharmacy. 

## 2023-11-06 NOTE — Telephone Encounter (Signed)
 [facility] called to request PT Medication refill  Dimethyl Fumarate  240 MG CPD    Pharmacy is  [facility] [place], [place] - [address] [place] (Ph: [phone])

## 2023-12-02 ENCOUNTER — Other Ambulatory Visit: Payer: Self-pay

## 2023-12-02 ENCOUNTER — Telehealth: Payer: Self-pay | Admitting: Neurology

## 2023-12-02 DIAGNOSIS — G35 Multiple sclerosis: Secondary | ICD-10-CM

## 2023-12-02 NOTE — Telephone Encounter (Signed)
 Pt husband called to request Pt get a referral for Neurology sent out to [facility] . Pt has moved to [place] and Pt husband wanted to see  a closer MD .Pt husband would like referral sent to  [facility]  Neurology   [facility]  Neurology  Phone:[phone] Fax:[phone]

## 2023-12-02 NOTE — Telephone Encounter (Signed)
 External Referral sent to [facility]  Neurology  [phone] [phone]

## 2023-12-02 NOTE — Telephone Encounter (Signed)
 Referral for neurology fax to [facility]. Phone: [phone], Fax: [phone]

## 2023-12-12 ENCOUNTER — Encounter: Payer: Self-pay | Admitting: Neurology

## 2023-12-12 MED ORDER — DIAZEPAM 5 MG PO TABS
5.0000 mg | ORAL_TABLET | Freq: Three times a day (TID) | ORAL | 1 refills | Status: DC | PRN
Start: 2023-12-12 — End: 2023-12-13

## 2023-12-12 NOTE — Telephone Encounter (Signed)
 LOV 10/29/23  Next OV 06/02/24  Last refill 04/11/23, #270, 1 refills  Please review, thanks!

## 2023-12-13 ENCOUNTER — Telehealth: Payer: Self-pay | Admitting: Neurology

## 2023-12-13 MED ORDER — DIAZEPAM 5 MG PO TABS: 5.0000 mg | ORAL_TABLET | Freq: Three times a day (TID) | ORAL | 1 refills | Status: AC | PRN

## 2023-12-13 NOTE — Telephone Encounter (Signed)
 Pt husband called to speak to MD ASAP pt medication was sent to wrong  Pharmacy  pt medication diazepam  (VALIUM ) 5 MG tablet  was sent to [id number] in [place] . Pt has moved to [place] . [facility] [place] [facility] MD Has to request medication transfer .

## 2023-12-13 NOTE — Telephone Encounter (Signed)
 Dr. Buck you are work in provider today,   See the below, I called [facility] to cancel Rx and I have it pending for the correct pharmacy.   Thanks!

## 2023-12-13 NOTE — Telephone Encounter (Signed)
 Rx signed for Valium  with change in pharmacy noted to [place].

## 2024-06-02 ENCOUNTER — Ambulatory Visit: Admitting: Neurology
